# Patient Record
Sex: Male | Born: 1950 | Race: White | Hispanic: No | Marital: Married | Smoking: Never smoker
Health system: Southern US, Community
[De-identification: ages and names within clinical notes are randomized; demographics above are authoritative.]

## PROBLEM LIST (undated history)

## (undated) DIAGNOSIS — E43 Unspecified severe protein-calorie malnutrition: Secondary | ICD-10-CM

## (undated) DIAGNOSIS — G4700 Insomnia, unspecified: Secondary | ICD-10-CM

## (undated) DIAGNOSIS — K219 Gastro-esophageal reflux disease without esophagitis: Secondary | ICD-10-CM

## (undated) DIAGNOSIS — F329 Major depressive disorder, single episode, unspecified: Secondary | ICD-10-CM

## (undated) DIAGNOSIS — M5090 Cervical disc disorder, unspecified, unspecified cervical region: Secondary | ICD-10-CM

## (undated) DIAGNOSIS — Z87442 Personal history of urinary calculi: Secondary | ICD-10-CM

## (undated) DIAGNOSIS — N179 Acute kidney failure, unspecified: Secondary | ICD-10-CM

## (undated) DIAGNOSIS — K744 Secondary biliary cirrhosis: Secondary | ICD-10-CM

## (undated) DIAGNOSIS — N183 Chronic kidney disease, stage 3 unspecified: Secondary | ICD-10-CM

## (undated) DIAGNOSIS — K7581 Nonalcoholic steatohepatitis (NASH): Secondary | ICD-10-CM

## (undated) DIAGNOSIS — Z973 Presence of spectacles and contact lenses: Secondary | ICD-10-CM

## (undated) DIAGNOSIS — G629 Polyneuropathy, unspecified: Secondary | ICD-10-CM

## (undated) DIAGNOSIS — F32A Depression, unspecified: Secondary | ICD-10-CM

## (undated) DIAGNOSIS — K7682 Hepatic encephalopathy: Secondary | ICD-10-CM

## (undated) DIAGNOSIS — E119 Type 2 diabetes mellitus without complications: Secondary | ICD-10-CM

## (undated) DIAGNOSIS — I8501 Esophageal varices with bleeding: Secondary | ICD-10-CM

## (undated) DIAGNOSIS — E559 Vitamin D deficiency, unspecified: Secondary | ICD-10-CM

## (undated) DIAGNOSIS — E1140 Type 2 diabetes mellitus with diabetic neuropathy, unspecified: Secondary | ICD-10-CM

## (undated) DIAGNOSIS — R1310 Dysphagia, unspecified: Secondary | ICD-10-CM

## (undated) DIAGNOSIS — R188 Other ascites: Secondary | ICD-10-CM

## (undated) DIAGNOSIS — H269 Unspecified cataract: Secondary | ICD-10-CM

## (undated) DIAGNOSIS — M109 Gout, unspecified: Secondary | ICD-10-CM

## (undated) DIAGNOSIS — I440 Atrioventricular block, first degree: Secondary | ICD-10-CM

## (undated) DIAGNOSIS — G5603 Carpal tunnel syndrome, bilateral upper limbs: Principal | ICD-10-CM

## (undated) DIAGNOSIS — Z9289 Personal history of other medical treatment: Secondary | ICD-10-CM

## (undated) DIAGNOSIS — G4733 Obstructive sleep apnea (adult) (pediatric): Secondary | ICD-10-CM

## (undated) DIAGNOSIS — D696 Thrombocytopenia, unspecified: Secondary | ICD-10-CM

## (undated) DIAGNOSIS — D689 Coagulation defect, unspecified: Secondary | ICD-10-CM

## (undated) DIAGNOSIS — G061 Intraspinal abscess and granuloma: Secondary | ICD-10-CM

## (undated) DIAGNOSIS — K222 Esophageal obstruction: Secondary | ICD-10-CM

## (undated) DIAGNOSIS — I639 Cerebral infarction, unspecified: Secondary | ICD-10-CM

## (undated) DIAGNOSIS — K7200 Acute and subacute hepatic failure without coma: Secondary | ICD-10-CM

## (undated) DIAGNOSIS — D638 Anemia in other chronic diseases classified elsewhere: Secondary | ICD-10-CM

## (undated) HISTORY — PX: KIDNEY STONE SURGERY: SHX686

## (undated) HISTORY — DX: Carpal tunnel syndrome, bilateral upper limbs: G56.03

## (undated) HISTORY — PX: UPPER GASTROINTESTINAL ENDOSCOPY: SHX188

## (undated) HISTORY — PX: COLONOSCOPY: SHX174

## (undated) HISTORY — DX: Intraspinal abscess and granuloma: G06.1

## (undated) HISTORY — DX: Other ascites: R18.8

## (undated) HISTORY — PX: KIDNEY TRANSPLANT: SHX239

---

## 2003-03-27 ENCOUNTER — Encounter (INDEPENDENT_AMBULATORY_CARE_PROVIDER_SITE_OTHER): Payer: Self-pay | Admitting: Specialist

## 2003-03-27 ENCOUNTER — Encounter: Payer: Self-pay | Admitting: Internal Medicine

## 2003-03-27 ENCOUNTER — Ambulatory Visit (HOSPITAL_COMMUNITY): Admission: RE | Admit: 2003-03-27 | Discharge: 2003-03-27 | Payer: Self-pay | Admitting: Gastroenterology

## 2003-05-10 ENCOUNTER — Other Ambulatory Visit: Admission: RE | Admit: 2003-05-10 | Discharge: 2003-05-10 | Payer: Self-pay | Admitting: Oncology

## 2004-08-12 ENCOUNTER — Ambulatory Visit: Payer: Self-pay | Admitting: Internal Medicine

## 2004-08-14 ENCOUNTER — Ambulatory Visit: Payer: Self-pay | Admitting: Internal Medicine

## 2004-08-15 ENCOUNTER — Ambulatory Visit: Payer: Self-pay | Admitting: Internal Medicine

## 2004-08-15 ENCOUNTER — Ambulatory Visit (HOSPITAL_COMMUNITY): Admission: RE | Admit: 2004-08-15 | Discharge: 2004-08-15 | Payer: Self-pay | Admitting: Internal Medicine

## 2004-08-16 ENCOUNTER — Encounter: Admission: RE | Admit: 2004-08-16 | Discharge: 2004-08-16 | Payer: Self-pay | Admitting: Internal Medicine

## 2004-08-16 ENCOUNTER — Ambulatory Visit: Payer: Self-pay | Admitting: Internal Medicine

## 2004-08-26 ENCOUNTER — Ambulatory Visit: Payer: Self-pay | Admitting: Internal Medicine

## 2004-08-26 ENCOUNTER — Encounter: Payer: Self-pay | Admitting: Internal Medicine

## 2004-09-23 ENCOUNTER — Ambulatory Visit: Payer: Self-pay | Admitting: Internal Medicine

## 2004-09-23 ENCOUNTER — Ambulatory Visit (HOSPITAL_COMMUNITY): Admission: RE | Admit: 2004-09-23 | Discharge: 2004-09-23 | Payer: Self-pay | Admitting: Internal Medicine

## 2004-09-23 DIAGNOSIS — K222 Esophageal obstruction: Secondary | ICD-10-CM | POA: Insufficient documentation

## 2004-11-04 ENCOUNTER — Ambulatory Visit: Payer: Self-pay | Admitting: Internal Medicine

## 2006-11-12 ENCOUNTER — Ambulatory Visit: Payer: Self-pay | Admitting: Internal Medicine

## 2006-11-19 ENCOUNTER — Encounter: Payer: Self-pay | Admitting: Internal Medicine

## 2006-11-19 ENCOUNTER — Ambulatory Visit: Payer: Self-pay | Admitting: Internal Medicine

## 2006-12-03 ENCOUNTER — Ambulatory Visit: Payer: Self-pay | Admitting: Internal Medicine

## 2007-01-19 ENCOUNTER — Encounter (HOSPITAL_COMMUNITY): Admission: RE | Admit: 2007-01-19 | Discharge: 2007-04-12 | Payer: Self-pay | Admitting: Emergency Medicine

## 2007-02-02 ENCOUNTER — Ambulatory Visit: Payer: Self-pay | Admitting: Oncology

## 2007-03-16 ENCOUNTER — Ambulatory Visit: Payer: Self-pay | Admitting: Internal Medicine

## 2007-06-06 ENCOUNTER — Inpatient Hospital Stay (HOSPITAL_COMMUNITY): Admission: EM | Admit: 2007-06-06 | Discharge: 2007-06-08 | Payer: Self-pay | Admitting: Emergency Medicine

## 2007-06-08 ENCOUNTER — Ambulatory Visit: Payer: Self-pay | Admitting: Internal Medicine

## 2007-07-14 ENCOUNTER — Ambulatory Visit: Payer: Self-pay | Admitting: Internal Medicine

## 2007-11-30 ENCOUNTER — Encounter: Payer: Self-pay | Admitting: Internal Medicine

## 2008-05-12 ENCOUNTER — Inpatient Hospital Stay (HOSPITAL_COMMUNITY): Admission: EM | Admit: 2008-05-12 | Discharge: 2008-05-19 | Payer: Self-pay | Admitting: Emergency Medicine

## 2008-05-15 ENCOUNTER — Ambulatory Visit: Payer: Self-pay | Admitting: Gastroenterology

## 2008-05-17 ENCOUNTER — Encounter (INDEPENDENT_AMBULATORY_CARE_PROVIDER_SITE_OTHER): Payer: Self-pay | Admitting: Interventional Radiology

## 2008-06-13 ENCOUNTER — Ambulatory Visit: Payer: Self-pay | Admitting: Internal Medicine

## 2008-06-14 LAB — CONVERTED CEMR LAB
AST: 28 units/L (ref 0–37)
Albumin: 3.4 g/dL — ABNORMAL LOW (ref 3.5–5.2)
Alkaline Phosphatase: 54 units/L (ref 39–117)
BUN: 10 mg/dL (ref 6–23)
Chloride: 103 meq/L (ref 96–112)
Eosinophils Absolute: 0.1 10*3/uL (ref 0.0–0.7)
Eosinophils Relative: 3.7 % (ref 0.0–5.0)
GFR calc non Af Amer: 107 mL/min
Glucose, Bld: 298 mg/dL — ABNORMAL HIGH (ref 70–99)
INR: 1.4 — ABNORMAL HIGH (ref 0.8–1.0)
Monocytes Relative: 15.1 % — ABNORMAL HIGH (ref 3.0–12.0)
Neutrophils Relative %: 48.8 % (ref 43.0–77.0)
Platelets: 60 10*3/uL — ABNORMAL LOW (ref 150–400)
Potassium: 4.3 meq/L (ref 3.5–5.1)
WBC: 3.5 10*3/uL — ABNORMAL LOW (ref 4.5–10.5)

## 2008-07-10 ENCOUNTER — Encounter: Payer: Self-pay | Admitting: Internal Medicine

## 2008-07-11 ENCOUNTER — Ambulatory Visit (HOSPITAL_COMMUNITY): Admission: RE | Admit: 2008-07-11 | Discharge: 2008-07-11 | Payer: Self-pay | Admitting: Internal Medicine

## 2008-07-11 ENCOUNTER — Ambulatory Visit: Payer: Self-pay | Admitting: Internal Medicine

## 2008-08-03 ENCOUNTER — Encounter: Payer: Self-pay | Admitting: Internal Medicine

## 2008-12-20 ENCOUNTER — Encounter (INDEPENDENT_AMBULATORY_CARE_PROVIDER_SITE_OTHER): Payer: Self-pay | Admitting: *Deleted

## 2009-01-01 ENCOUNTER — Encounter: Payer: Self-pay | Admitting: Internal Medicine

## 2009-01-30 ENCOUNTER — Encounter (INDEPENDENT_AMBULATORY_CARE_PROVIDER_SITE_OTHER): Payer: Self-pay | Admitting: *Deleted

## 2009-06-01 ENCOUNTER — Encounter: Payer: Self-pay | Admitting: Internal Medicine

## 2009-06-25 ENCOUNTER — Encounter: Payer: Self-pay | Admitting: Internal Medicine

## 2009-11-26 ENCOUNTER — Encounter: Payer: Self-pay | Admitting: Internal Medicine

## 2009-12-28 ENCOUNTER — Encounter: Payer: Self-pay | Admitting: Internal Medicine

## 2010-03-28 ENCOUNTER — Ambulatory Visit: Payer: Self-pay | Admitting: Internal Medicine

## 2010-03-28 LAB — CONVERTED CEMR LAB
ALT: 23 units/L (ref 0–53)
AST: 30 units/L (ref 0–37)
Alkaline Phosphatase: 48 units/L (ref 39–117)
Basophils Absolute: 0 10*3/uL (ref 0.0–0.1)
Eosinophils Absolute: 0.1 10*3/uL (ref 0.0–0.7)
HCT: 37.2 % — ABNORMAL LOW (ref 39.0–52.0)
Lymphs Abs: 0.8 10*3/uL (ref 0.7–4.0)
MCHC: 35.2 g/dL (ref 30.0–36.0)
Monocytes Absolute: 0.4 10*3/uL (ref 0.1–1.0)
Monocytes Relative: 13 % — ABNORMAL HIGH (ref 3.0–12.0)
Platelets: 46 10*3/uL — CL (ref 150.0–400.0)
RDW: 15.5 % — ABNORMAL HIGH (ref 11.5–14.6)
Sodium: 135 meq/L (ref 135–145)
Total Bilirubin: 1.4 mg/dL — ABNORMAL HIGH (ref 0.3–1.2)
Total Protein: 6.8 g/dL (ref 6.0–8.3)

## 2010-04-01 ENCOUNTER — Encounter: Payer: Self-pay | Admitting: Internal Medicine

## 2010-04-26 ENCOUNTER — Ambulatory Visit: Payer: Self-pay | Admitting: Internal Medicine

## 2010-04-26 ENCOUNTER — Ambulatory Visit (HOSPITAL_COMMUNITY)
Admission: RE | Admit: 2010-04-26 | Discharge: 2010-04-26 | Payer: Self-pay | Source: Home / Self Care | Admitting: Internal Medicine

## 2010-06-03 ENCOUNTER — Encounter: Payer: Self-pay | Admitting: Internal Medicine

## 2010-06-04 ENCOUNTER — Encounter: Payer: Self-pay | Admitting: Internal Medicine

## 2010-08-06 NOTE — Letter (Signed)
Summary: Diabetic Instructions  [facility]  [address] [place], [place] [address]   Phone: [phone]  Fax: [phone]    [name] [birth year] MRN: [id number]   _ x_   ORAL DIABETIC MEDICATION INSTRUCTIONS             ACTOS, METFORMIN The day before your procedure:   Take your diabetic pill as you do normally  The day of your procedure:   Do not take your diabetic pill    We will check your blood sugar levels during the admission process and again in Recovery before discharging you home  ________________________________________________________________________

## 2010-08-06 NOTE — Procedures (Signed)
Summary: Instructions for procedure/[place]  Instructions for procedure/[place]   Imported By: Sherian Rein 04/01/2010 10:11:13  _____________________________________________________________________  External Attachment:    Type:   Image     Comment:   External Document

## 2010-08-06 NOTE — Miscellaneous (Signed)
Summary: Propranolol Rx  Clinical Lists Changes  Medications: Changed medication from PROPRANOLOL HCL 20 MG TABS (PROPRANOLOL HCL) 1 tablet by mouth once daily to PROPRANOLOL HCL 20 MG TABS (PROPRANOLOL HCL) 1 tablet by mouth once daily - Signed Rx of PROPRANOLOL HCL 20 MG TABS (PROPRANOLOL HCL) 1 tablet by mouth once daily;  #30 x 2;  Signed;  Entered by: Lamona Curl CMA (AAMA);  Authorized by: Hilarie Fredrickson MD;  Method used: Electronically to [facility]*, [address], [place], [place], [place]  [address], Ph: [phone], Fax: [phone]    Prescriptions: PROPRANOLOL HCL 20 MG TABS (PROPRANOLOL HCL) 1 tablet by mouth once daily  #30 x 2   Entered by:   Lamona Curl CMA (AAMA)   Authorized by:   Hilarie Fredrickson MD   Signed by:   Lamona Curl CMA (AAMA) on 04/01/2010   Method used:   Electronically to        [facility]* (retail)       [address] [place], [place]  [address]       Ph: [phone]       Fax: [phone]   RxID:   [id number]

## 2010-08-06 NOTE — Medication Information (Signed)
Summary: Propranolol/[employer]  Propranolol/[employer]   Imported By: Sherian Rein 06/07/2010 14:07:55  _____________________________________________________________________  External Attachment:    Type:   Image     Comment:   External Document

## 2010-08-06 NOTE — Assessment & Plan Note (Signed)
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 His liver disease was initially diagnosed in February 2006. Complications of his liver disease have included bleeding esophageal varices and mild transient ascites. The patient has been lost to followup despite being sent minor notices. He has not been seen in close to 2 years. As well, he has not follow up with [facility] as they requested. He schedules this appointment and now because of some lower abdominal discomfort that began 3 weeks ago. He is concerned that this is a precursor to variceal bleeding. He describes the discomfort as cramping and associated with somewhat constipated bowels. Problem is relieved with defecation. He is also noted that Pepto-Bismol tablets help. The discomfort has been somewhat better recently. He is on several oral agents for his diabetes which he thinks over his bowel habits. His last colonoscopy was in 2008 with followup anticipated in 2013. He denies melena or hematochezia. No weight loss. He continues to work at [employer]. He denies problems with edema, ascites, or encephalopathy. His last upper endoscopy was performed in January 2010. At that time short segment grade 2 varices were band ligated. Routine followup should have been 6 months thereafter. His primary care physician is in [place].Marland Kitchen His endocrinologist in [place].   GI Review of Systems    Reports abdominal pain.     Location of  Abdominal pain: lower abdomen.    Denies acid reflux, belching, bloating, chest  pain, dysphagia with liquids, dysphagia with solids, heartburn, loss of appetite, nausea, vomiting, vomiting blood, weight loss, and  weight gain.      Reports constipation, diarrhea, and  liver problems.     Denies anal fissure, black tarry stools, change in bowel habit, diverticulosis, fecal incontinence, heme positive stool, hemorrhoids, irritable bowel syndrome, jaundice, light color stool, rectal bleeding, and  rectal pain.    Current Medications (verified): 1)  Propranolol Hcl 20 Mg Tabs (Propranolol Hcl) .Marland Kitchen.. 1 Tablet By Mouth Once Daily 2)  Protonix 40 Mg Tbec (Pantoprazole Sodium) .Marland Kitchen.. 1 Tablet By Mouth Once Daily 3)  Metformin Hcl 1000 Mg Tabs (Metformin Hcl) .Marland Kitchen.. 1 Tablet By Mouth Two Times A Day 4)  Victosa 18mg  .... Inject Once Daily 5)  Actos 30 Mg Tabs (Pioglitazone Hcl) .... Once Daily  Allergies (verified): No Known Drug Allergies  Past History:  Past Medical History: Reviewed history from 09/27/2007 and no changes required. 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 Unremarkable  Family History: Reviewed history from 06/13/2008 and no changes required. No FH of Colon Cancer: Family History of Diabetes: Father Family History of Heart Disease: Father  Social History: Reviewed history from 06/13/2008 and no changes required. Patient has never smoked.  Alcohol Use - no Illicit Drug Use - no Patient  does not get regular exercise.  Review of Systems  The patient denies allergy/sinus, anemia, anxiety-new, arthritis/joint pain, back pain, blood in urine, breast changes/lumps, change in vision, confusion, cough, coughing up blood, depression-new, fainting, fatigue, fever, headaches-new, hearing problems, heart murmur, heart rhythm changes, itching, menstrual pain, muscle pains/cramps, night sweats, nosebleeds, pregnancy symptoms, shortness of breath, skin rash, sleeping problems, sore throat, swelling of feet/legs, swollen lymph glands, thirst - excessive , urination - excessive , urination changes/pain, urine leakage, vision changes, and voice change.    Vital Signs:  Patient profile:   60 year old male Height:      71 inches Weight:      209.38 pounds BMI:     29.31 Pulse rate:   72 / minute Pulse rhythm:   regular BP sitting:   104 / 60  (left arm) Cuff size:   regular  Vitals Entered By: June McMurray CMA Duncan Dull) (March 28, 2010 10:54 AM)  Physical Exam  General:  Well developed,obese, well nourished, no acute distress. Head:  Normocephalic and atraumatic. Eyes:  PERRLA, no icterus. Nose:  No deformity, discharge,  or lesions. Mouth:  No deformity or lesions, dentition normal. Neck:  Supple; no masses or thyromegaly. Lungs:  Clear throughout to auscultation. Heart:  Regular rate and rhythm; no murmurs, rubs,  or bruits. Abdomen:  Soft, nontender and nondistended. No masses, hepatosplenomegaly or hernias noted. Normal bowel sounds. Msk:  Symmetrical with no gross deformities. Normal posture. Pulses:  Normal pulses noted. Extremities:  No clubbing, cyanosis, edema or deformities noted. Neurologic:  Alert and  oriented x4;  grossly normal neurologically.no asterixis Skin:  hypervascularity of the malar region Psych:  Alert and cooperative. Normal mood and affect.   Impression & Recommendations:  Problem # 1:  ABDOMINAL PAIN, GENERALIZED (ICD-40.32) 60 year old with  multiple medical problems including cryptogenic cirrhosis who presents with intermittent but improved lower abdominal pain of several weeks duration. The features were most consistent with discomfort due to altered bowel habits. No worrisome features. Colonoscopy up-to-date.  Plan: #1. Reassurance that this is not a precursor to variceal bleeding #2. Okay to use Pepto-Bismol p.r.n. if this helps #3. Stool softeners or other agents p.r.n. to keep bowels regular .  Problem # 2:  CONSTIPATION (ICD-564.00) stool softeners or other agents as discussed p.r.n.  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 Well compensated. History of variceal bleed. Lost to followup in this office as well as [facility].  Plan: #1. Blood work including CBC, comprehensive metabolic panel, PT/INR, TSH #2. Due for followup surveillance endoscopy to evaluate varices and band ligate if indicated. The nature of the procedure as well as the risks, benefits, and alternatives were reviewed. He understood and agreed to proceed. This has been tentatively set up for Friday, October 21 at 8 AM at [facility] long hospital.. Hold diabetic medications the morning of the procedure until p.o. intake resumed. #3. Sensible exercise and gradual weight loss #4. Reestablish with [facility] service. Advised. 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 History of endoscopy with band ligation. Last endoscopy with ligation January 2010. Overdue for followup. Plans for endoscopy with possible band ligation of varices as outlined above  Problem # 5:  GERD (ICD-530.81) GERD complicated by esophagitis and stricture. Currently asymptomatic on pantoprazole.  Plan: #1. Continue pantoprazole #2. Reflux precautions with attention to weight loss  Problem # 6:  PERSONAL HX COLONIC POLYPS (ICD-V12.72) history of adenomatous colon polyps. Surveillance up to date. Due for routine  follow up around May 2013.  Problem # 7:  DM (ICD-250.00) adjust diabetic medications for the procedure.  Other Orders: ZEGD with Banding (ZEGD Band) TLB-CBC Platelet - w/Differential (85025-CBCD) TLB-CMP (Comprehensive Metabolic Pnl) (80053-COMP) TLB-PT (Protime) (85610-PTP)  Patient Instructions: 1)  Please go to the lab on the basement floor before leaving today in order to have your CBC, CMET and Patient/INR drawn. 2)  You have been scheduled for an endoscopy with banding at [facility] on 04/26/10 @ 8 am. You should arrive at Outpatient Registration  @ 7 am for regsitration. 3)  Copy sent to : Dr Drinda Butts, Dr. Konrad Felix ([place]), Dr. Roselle Locus, [place] ([facility]) 4)  The medication list was reviewed and reconciled.  All changed / newly prescribed medications were explained.  A complete medication list was provided to the patient / caregiver.

## 2010-08-06 NOTE — Progress Notes (Signed)
Summary: [facility]   Imported By: Sherian Rein 04/19/2010 09:04:05  _____________________________________________________________________  External Attachment:    Type:   Image     Comment:   External Document

## 2010-08-06 NOTE — Procedures (Signed)
Summary: EGD   EGD  Procedure date:  08/15/2004  Findings:      Location: [facility]  Esophageal Varices Portal Hypertension  EGD  Procedure date:  08/15/2004  Findings:      Location: [facility]  Esophageal Varices Portal Hypertension Patient Name: Izrael, Peak MRN: [id number] Procedure Procedures: EsophagoscopyCPT: 43200.    with variceal sclerosis or banding. CPT: C4495593.  Personnel: Endoscopist: Iva Boop, MD, [facility].  Exam Location: Exam performed in Endoscopy Suite. Outpatient  Patient Consent: Procedure, Alternatives, Risks and Benefits discussed, consent obtained,  Indications  Therapeutics: Reason for exam: Treatment of varices.  History  Current Medications: Patient is not currently taking Coumadin.  Comments: Esophageal varices seen at EGD yesterday with recent upper GI bleed. Pre-Exam Physical: Performed Aug 14, 2004  Entire physical exam was normal. Cardio- pulmonary exam, HEENT exam, Mental status exam WNL.  Evidence: Based on portal hypertension. Based on abnormal labs including: Platelets  Exam Exam Info: Maximum depth of insertion Stomach, intended Stomach. Patient position: on left side. Gastric retroflexion performed. Images taken. ASA Classification: III. Tolerance: excellent.  Sedation Meds: Patient assessed and found to be appropriate for moderate (conscious) sedation. Benzocaine/Tetracaine given aerosolized. Versed 8 mg. given IV. Fentanyl 50 mcg. given IV.  Monitoring: BP and pulse monitoring done. Oximetry used. Supplemental O2 given  Fluoroscopy: Fluoroscopy was not used.  Findings Normal: Proximal Esophagus.  - VARICES: Proximal level Mid Esophagus. 3 varices found. Proximal edge of varices 30 cm from incisors, distal margin 39 cm. maximum size: Grade II. Varices show no stigmata of recent bleed. ICD9: Esophageal Varices, without bleed: 456.1. - Banding: Mid Esophagus. A total of 5 bands were fired, 4  bands were placed, 1 bands misfired. Banding device used: Wilson-Cook. Outcome: successful.  MUCOSAL ABNORMALITY: Fundus to Body. Red spots present. Edema present. ICD9: Portal Hypertension: 572.3. Comment: Comsistent with portal gastropathy.   Assessment Abnormal examination, see findings above.  Diagnoses: 456.1: Esophageal Varices, without bleed.  572.3: Portal Hypertension.   Comments: Successful ligation of esophageal varices. Events  Unplanned Intervention: No unplanned interventions were required.  Plans Medication(s): Continue current medications.  Disposition: After procedure patient sent to recovery.  Comments: Keep follow-up with Dr. Yancey Flemings tomorrow. He will arrange further evaluation and treatment.  This report was created from the original endoscopy report, which was reviewed and signed by the above listed endoscopist.   cc:  Yancey Flemings, MD      Drue Second, MD      Ellamae Sia, MD      The Patient

## 2010-08-06 NOTE — Procedures (Signed)
Summary: Colonoscopy   Colonoscopy  Procedure date:  08/14/2004  Findings:      Location:  [facility].  Results: Hemorrhoids.     Results: Diverticulosis.       Pathology:  Hyperplastic polyp.       Procedures Next Due Date:    Colonoscopy: 08/2007  Colonoscopy  Procedure date:  08/14/2004  Findings:      Location:  [facility].  Results: Hemorrhoids.     Results: Diverticulosis.       Pathology:  Hyperplastic polyp.       Procedures Next Due Date:    Colonoscopy: 08/2007 Patient Name: [name], [name] MRN: [id number] Procedure Procedures: Colonoscopy CPT: 78295.    with polypectomy. CPT: A3573898.  Personnel: Endoscopist: Wilhemina Bonito. Marina Goodell, MD.  Referred By: Ellamae Sia, MD.  Exam Location: Exam performed in Outpatient Clinic. Outpatient  Patient Consent: Procedure, Alternatives, Risks and Benefits discussed, consent obtained, from patient. Consent was obtained by the RN.  Indications  Surveillance of: Adenomatous Polyp(s). This is an initial surveillance exam. Initial polypectomy was performed in 2004. in Sep. 1-2 Polyps were found at Index Exam. Largest polyp removed was > 19 mm. Prior polyp located in distal colon. Pathology of worst  polyp: high-grade dysplasia.  History  Current Medications: Patient is not currently taking Coumadin.  Pre-Exam Physical: Performed Aug 14, 2004. Entire physical exam was normal.  Exam Exam: Extent of exam reached: Cecum, extent intended: Cecum.  The cecum was identified by appendiceal orifice and IC valve. Patient position: on left side. Colon retroflexion performed. Images taken. ASA Classification: II. Tolerance: excellent.  Monitoring: Pulse and BP monitoring, Oximetry used. Supplemental O2 given.  Colon Prep Used Miralax for colon prep. Prep results: excellent.  Sedation Meds: Patient assessed and found to be appropriate for moderate (conscious) sedation. Fentanyl 75 mcg. given IV.  Versed 6 mg. given IV.  Findings NORMAL EXAM: Cecum to Rectum.  DIVERTICULOSIS: Sigmoid Colon. ICD9: Diverticulosis, Colon: 562.10. Comments: Very mild changes.  MULTIPLE POLYPS: Sigmoid Colon. minimum size 2 mm, maximum size 3 mm. Procedure:  snare without cautery, removed, Polyp retrieved, 2 polyps ICD9: Colon Polyps: 211.3.   Assessment Abnormal examination, see findings above.  Diagnoses: 211.3: Colon Polyps.  562.10: Diverticulosis, Colon.  455.0: Hemorrhoids, Internal.   Events  Unplanned Interventions: No intervention was required.  Unplanned Events: There were no complications. Plans Disposition: After procedure patient sent to recovery. After recovery patient sent home.  Scheduling/Referral: Colonoscopy, to Wilhemina Bonito. Marina Goodell, MD, in 3 years,    This report was created from the original endoscopy report, which was reviewed and signed by the above listed endoscopist.   cc:  Ellamae Sia, MD      The Patient

## 2010-08-06 NOTE — Letter (Signed)
Summary: EGD Instructions  [facility]  [address] [place], [place] [address]   Phone: [phone]  Fax: [phone]       AARON BOSTWICK    05-31-1951    MRN: [id number]       Procedure Day /Date: 04/26/10 Friday      Arrival Time: 7:00 am     Procedure Time: 8:00 am     Location of Procedure:                    _x  _ [facility] ( Outpatient Registration)  PREPARATION FOR ENDOSCOPY   On 04/26/10 THE DAY OF THE PROCEDURE:  1.   No solid foods, milk or milk products are allowed after midnight the night before your procedure.  2.   Do not drink anything colored red or purple.  Avoid juices with pulp.  No orange juice.  3.  You may drink clear liquids until 6:00 am, which is 2 hours before your procedure.                                                                                                CLEAR LIQUIDS INCLUDE: Water Jello Ice Popsicles Tea (sugar ok, no milk/cream) Powdered fruit flavored drinks Coffee (sugar ok, no milk/cream) Gatorade Juice: apple, white grape, white cranberry  Lemonade Clear bullion, consomm, broth Carbonated beverages (any kind) Strained chicken noodle soup Hard Candy   MEDICATION INSTRUCTIONS  Unless otherwise instructed, you should take regular prescription medications with a small sip of water as early as possible the morning of your procedure.  Diabetic patients - see separate instructions.                 OTHER INSTRUCTIONS  You will need a responsible adult at least 58 years of age to accompany you and drive you home.   This person must remain in the waiting room during your procedure.  Wear loose fitting clothing that is easily removed.  Leave jewelry and other valuables at home.  However, you may wish to bring a book to read or an iPod/MP3 player to listen to music as you wait for your procedure to start.  Remove all body piercing jewelry and leave at home.  Total time from sign-in  until discharge is approximately 2-3 hours.  You should go home directly after your procedure and rest.  You can resume normal activities the day after your procedure.  The day of your procedure you should not:   Drive   Make legal decisions   Operate machinery   Drink alcohol   Return to work  You will receive specific instructions about eating, activities and medications before you leave.    The above instructions have been reviewed and explained to me by   Lamona Curl CMA Duncan Dull)  March 28, 2010 11:39 AM     I fully understand and can verbalize these instructions _____________________________ Date 03/28/10

## 2010-08-06 NOTE — Miscellaneous (Signed)
Summary: Refill of Pantoprazole  Clinical Lists Changes  Medications: Rx of PROTONIX 40 MG TBEC (PANTOPRAZOLE SODIUM) 1 tablet by mouth once daily;  #30 x 11;  Signed;  Entered by: Milford Cage NCMA;  Authorized by: Hilarie Fredrickson MD;  Method used: Electronically to [facility]*, [address], [place], [place], [place]  [address], Ph: [phone], Fax: [phone]    Prescriptions: PROTONIX 40 MG TBEC (PANTOPRAZOLE SODIUM) 1 tablet by mouth once daily  #30 x 11   Entered by:   Milford Cage NCMA   Authorized by:   Hilarie Fredrickson MD   Signed by:   Milford Cage NCMA on 06/03/2010   Method used:   Electronically to        [facility]* (retail)       [address] [place], [place]  [address]       Ph: [phone]       Fax: [phone]   RxID:   [id number]

## 2010-08-06 NOTE — Medication Information (Signed)
Summary: [name]   Imported By: Lester Elwood 01/02/2010 11:00:50  _____________________________________________________________________  External Attachment:    Type:   Image     Comment:   External Document

## 2010-08-06 NOTE — Medication Information (Signed)
Summary: [name]   Imported By: Lester Sanger 11/30/2009 08:08:34  _____________________________________________________________________  External Attachment:    Type:   Image     Comment:   External Document

## 2010-08-06 NOTE — Procedures (Signed)
Summary: Upper Endoscopy  Patient: Yanuel Tagg Note: All result statuses are Final unless otherwise noted.  Tests: (1) Upper Endoscopy (EGD)   EGD Upper Endoscopy       DONE     [facility]     [address] [place], [place]  [address]           ENDOSCOPY PROCEDURE REPORT           PATIENT:  Johnattan, Strassman  MR#:  [id number]     BIRTHDATE:  02/19/1951, 59 yrs. old  GENDER:  male           ENDOSCOPIST:  Wilhemina Bonito. Eda Keys, MD     Referred by:  Office           PROCEDURE DATE:  04/26/2010     PROCEDURE:  EGD with band ligation of varices x 3     ASA CLASS:  Class III     INDICATIONS:  Known esophageal varices, for surveillance exam.           MEDICATIONS:   Fentanyl 100 mcg, Versed 8 mg IV     TOPICAL ANESTHETIC:  Exactacain Spray           DESCRIPTION OF PROCEDURE:   After the risks benefits and     alternatives of the procedure were thoroughly explained, informed     consent was obtained.  The Pentax [id number] Therapeutic [id number]     endoscope was introduced through the mouth and advanced to the     second portion of the duodenum, without limitations.  The     instrument was slowly withdrawn as the mucosa was fully examined.     <<PROCEDUREIMAGES>>           Nuckles of Grade II varices were found in the distal esophagus in     3 columns. Also, scarring from prior banding evident.  Mild Portal     Hypertensive gastropathy throughout.  The duodenal bulb was normal     in appearance, as was the postbulbar duodenum.    Retroflexed     views revealed no additional abnormalities.    The scope was then     withdrawn from the patient and the procedure completed.           COMPLICATIONS:  None           ENDOSCOPIC IMPRESSION:     1) Grade II varices in the distal esophagus as described - s/p     band ligation x 3     2) Portal Hypertensive gastropathy     3) Normal duodenum           RECOMMENDATIONS:     1) Clear liquids until 4pm, then soft foods rest of day   thereafter. Resume prior diet tomorrow.     2) Repeat upper endoscopy with possible banding in 6 months           ______________________________     Wilhemina Bonito. Eda Keys, MD           CC:  Reather Littler, MD, The Patient           n.     eSIGNED:   Wilhemina Bonito. Eda Keys at 04/26/2010 08:54 AM           Albesa Seen, [id number]  Note: An exclamation mark (!) indicates a result that was not dispersed into the flowsheet. Document Creation Date: 04/26/2010 8:56 AM _______________________________________________________________________  Marland Kitchen  1) Order result status: Final Collection or observation date-time: 04/26/2010 08:38 Requested date-time:  Receipt date-time:  Reported date-time:  Referring Physician:   Ordering Physician: Fransico Setters [phone]) Specimen Source:  Source: Launa Grill Order Number: [id number] Lab site:

## 2010-08-06 NOTE — Procedures (Signed)
Summary: Colonoscopy/[place]  Colonoscopy/[place]   Imported By: Sherian Rein 04/19/2010 09:01:12  _____________________________________________________________________  External Attachment:    Type:   Image     Comment:   External Document

## 2010-08-06 NOTE — Procedures (Signed)
Summary: EGD   EGD  Procedure date:  08/14/2004  Findings:      Location: [facility]  Esophageal Varices Portal Gastropathy  EGD  Procedure date:  08/14/2004  Findings:      Location: [facility]  Esophageal Varices Portal Gastropathy Patient Name: Benjamen, Koelling MRN: [id number] Procedure Procedures: Panendoscopy (EGD) CPT: 43235.  Personnel: Endoscopist: Wilhemina Bonito. Marina Goodell, MD.  Referred By: Ellamae Sia, MD.  Exam Location: Exam performed in Outpatient Clinic. Outpatient  Patient Consent: Procedure, Alternatives, Risks and Benefits discussed, consent obtained, from patient. Consent was obtained by the RN.  Indications Symptoms: Hematemesis. Last bleeding episode >48 hours ago, documentation: patient report. Melena. Last bleeding episode >48 hours ago, documentation: patient report.  History  Current Medications: Patient is not currently taking Coumadin.  Pre-Exam Physical: Performed Aug 14, 2004  Entire physical exam was normal.  Exam Exam Info: Maximum depth of insertion Duodenum, intended Duodenum. Patient position: on left side. Vocal cords visualized. Gastric retroflexion performed. Images taken. ASA Classification: II. Tolerance: excellent.  Sedation Meds: Patient assessed and found to be appropriate for moderate (conscious) sedation. Residual sedation present from prior procedure today.  Monitoring: BP and pulse monitoring done. Oximetry used. Supplemental O2 given  Findings - VARICES: Proximal level Distal Esophagus. 3 varices found. Proximal edge of varices 30 cm from incisors, distal margin 39 cm. maximum size: Grade II. Varices show no stigmata of recent bleed. ICD9: Esophageal Varices, without bleed: 456.1. HIATAL HERNIA: Comments: NO GASTRIC VARICES.  - OTHER FINDING: CONGESTED MUCOSA C/W PORTAL GASTROPATHY in Body.  - Normal: Duodenal Bulb to Duodenal Apex.   Assessment Abnormal examination, see findings above.    Diagnoses: 456.1: Esophageal Varices, without bleed. CAUSE FOR RECENT GI BLEED.   Comments: PORTAL GASTROPATHY Events  Unplanned Intervention: No unplanned interventions were required.  Unplanned Events: There were no complications. Plans Disposition: After procedure patient sent to recovery. After recovery patient sent home.  Comments: 1.LABS TO WORK UP LIVER DISEASE 2.STAY OUT OF WORK FOR NOW 3.GET RECORDS FRON DR. KHAN 4.EGD WITH BANDING AT HOSPITAL TOMORROW (DR GESSNER 2:30 [facility]) 5.OFFICE VISIT WITH DR. Rileigh Kawashima FRIDAY FEB 10TH 8:00 AM  This report was created from the original endoscopy report, which was reviewed and signed by the above listed endoscopist.   cc:  Ellamae Sia, MD      Garey Ham, MD      The Patient

## 2010-08-06 NOTE — Progress Notes (Signed)
Summary: [facility]   Imported By: Sherian Rein 04/19/2010 09:03:18  _____________________________________________________________________  External Attachment:    Type:   Image     Comment:   External Document

## 2010-08-06 NOTE — Procedures (Signed)
Summary: EGD   EGD  Procedure date:  09/23/2004  Findings:      Location: [facility]  Findings: Esophagitis  Findings: Stricture:  Portal Hypertension Esophageal Varices Patient Name: [name], [name] MRN: [id number] Procedure Procedures: Panendoscopy (EGD) CPT: 43235.  Personnel: Endoscopist: Wilhemina Bonito. Marina Goodell, MD.  Exam Location: Exam performed in Endoscopy Suite.  Patient Consent: Procedure, Alternatives, Risks and Benefits discussed, consent obtained,  Indications  Surveillance of: Varices.  History  Current Medications: Patient is not currently taking Coumadin.  Pre-Exam Physical: Performed Aug 14, 2004  Cardio-pulmonary exam WNL. HEENT exam abnormal. Abdominal exam, Extremity exam, Neurological exam, Mental status exam WNL.  History of Known Varices: Esophageal, History of prior bleed. Prior eradication incomplete.  Etiology: Cryptogenic.  Evidence: Based on portal hypertension. Based on abnormal labs including:  Exam Exam Info: Maximum depth of insertion Duodenum, intended Duodenum. Patient position: on left side. Vocal cords visualized. Gastric retroflexion performed. Images taken. ASA Classification: III. Tolerance: excellent.  Sedation Meds: Demerol 50 mg. given IV. Versed 5 mg. given IV.  Monitoring: BP and pulse monitoring done. Oximetry used. Supplemental O2 given  Fluoroscopy: Fluoroscopy was not used.  Findings - VARICES: Proximal level Distal Esophagus. ICD9: Esophageal Varices, without bleed: 456.1.Comment: Mostly scarring and some stricturing from prior treatment.Remnant varices only, not requiring bandi ligation.  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 Severity is mild, erythema only.  [place] Classification: Grade A. ICD9: Esophagitis, Reflux: 530.11.  - MUCOSAL ABNORMALITY: Cardia to Antrum. Portal hypertension. ICD9: Portal Hypertension: 572.3. Comment: diffuse portal gastropathy.    Assessment Abnormal examination, see findings above.  Diagnoses: 456.1: Esophageal Varices, without bleed.  530.11: Esophagitis, Reflux.  572.3: Portal Hypertension.  530.3: Esophageal Stricture.   Events  Unplanned Intervention: No unplanned interventions were required.  Unplanned Events: There were no complications. Plans Medication(s): PPI: Pantoprazole/Protonix 40 mg QD,   Comments: Multivitamin with iron each day Disposition: After procedure patient sent to recovery. After recovery patient sent home.  Scheduling: Call office for appointment, to Wilhemina Bonito. Marina Goodell, MD, in 4-6 weeks    This report was created from the original endoscopy report, which was reviewed and signed by the above listed endoscopist.   cc:  Ellamae Sia, DO      The Patient

## 2010-08-06 NOTE — Progress Notes (Signed)
Summary: [facility]   Imported By: Sherian Rein 04/19/2010 09:04:51  _____________________________________________________________________  External Attachment:    Type:   Image     Comment:   External Document

## 2010-08-06 NOTE — Consult Note (Signed)
Summary: [facility]   Imported By: Sherian Rein 04/19/2010 09:02:17  _____________________________________________________________________  External Attachment:    Type:   Image     Comment:   External Document

## 2010-09-18 LAB — GLUCOSE, CAPILLARY: Glucose-Capillary: 139 mg/dL — ABNORMAL HIGH (ref 70–99)

## 2010-10-10 ENCOUNTER — Encounter: Payer: Self-pay | Admitting: Internal Medicine

## 2010-10-21 LAB — GLUCOSE, CAPILLARY
Glucose-Capillary: 243 mg/dL — ABNORMAL HIGH (ref 70–99)
Glucose-Capillary: 359 mg/dL — ABNORMAL HIGH (ref 70–99)

## 2010-10-29 ENCOUNTER — Encounter: Payer: Self-pay | Admitting: Internal Medicine

## 2010-11-05 ENCOUNTER — Ambulatory Visit (INDEPENDENT_AMBULATORY_CARE_PROVIDER_SITE_OTHER): Payer: Managed Care, Other (non HMO) | Admitting: Internal Medicine

## 2010-11-05 ENCOUNTER — Encounter: Payer: Self-pay | Admitting: Internal Medicine

## 2010-11-05 VITALS — BP 100/62 | HR 64 | Ht 71.0 in | Wt 219.8 lb

## 2010-11-05 DIAGNOSIS — Z86010 Personal history of colonic polyps: Secondary | ICD-10-CM

## 2010-11-05 DIAGNOSIS — K219 Gastro-esophageal reflux disease without esophagitis: Secondary | ICD-10-CM

## 2010-11-05 DIAGNOSIS — I8500 Esophageal varices without bleeding: Secondary | ICD-10-CM

## 2010-11-05 DIAGNOSIS — K7460 Unspecified cirrhosis of liver: Secondary | ICD-10-CM

## 2010-11-05 NOTE — Progress Notes (Signed)
HISTORY OF PRESENT ILLNESS:  [name] is a 60 y.o. 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 He has been seen at [facility] liver clinic. Complications have included bleeding esophageal varices and mild transient ascites. He was last seen in this office September 2011 after being lost to followup. Blood work at that time was stable with white blood cell count 3.1, platelet count 46,000, hemoglobin 13.1, normal BUN and creatinine of 12 and 0.6 respectively, INR 1.3, and total albumin 3.6. Bilirubin 1.4. He subsequently underwent surveillance upper endoscopy for history of varices. He underwent band ligation x3. Portal gastropathy noted. Repeat endoscopy in 6 months recommended. The patient has been doing well without any obvious complications of his liver disease. No evidence of GI bleeding, encephalopathy, or fluid accumulation. He was instructed to reestablish with [facility], but has failed to do so. He continues to demonstrate suboptimal insight into his disease and an unwillingness to take ownership in his disease. His last colonoscopy, for history of adenomatous polyps, was in 2008. Anticipate a followup in 2013. No lower GI complaints. Review of outside laboratories from 09/25/2010 at the office of Dr. Konrad Felix show Hemoccult negative stool. Normal lipids and PSA. Unchanged BUN and creatinine as well as liver profile and CBC. Except for occasional bloating and heartburn, his GI review of systems is negative. He continues on Protonix for GERD and propranolol for portal hypertension.  REVIEW OF SYSTEMS:  All non-GI ROS negative except for occasional mild ankle edema, muscle cramps, fatigue, insomnia.  Past Medical History  Diagnosis Date  . Pancytopenia   . Reflux esophagitis   . Portal hypertension   . Stricture and stenosis of esophagus   . Diverticulosis of colon (without mention  of hemorrhage)   . Internal hemorrhoids without mention of complication   . Benign neoplasm of colon   . Duodenitis without mention of hemorrhage   . 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 Personal history of other diseases of digestive system   . Personal history of diseases of blood and blood-forming organs   . Personal history of other diseases of digestive system   . Cirrhosis of liver without mention of alcohol   . Unspecified essential hypertension   . Obesity, unspecified   . Calculus of kidney   . Type II or unspecified type diabetes mellitus without mention of complication, not stated as uncontrolled     No past surgical history on file.  Social History [name]  reports that he has never smoked. He has never used smokeless tobacco. He reports that he does not drink alcohol or use illicit drugs.  family history includes Diabetes in his father and Heart disease in his father.  There is no history of Colon cancer.  No Known Allergies     PHYSICAL EXAMINATION: Vital signs: BP 100/62  Pulse 64  Ht 5\' 11"  (1.803 m)  Wt 219 lb 12.8 oz (99.701 kg)  BMI 30.66 kg/m2  Constitutional: generally well-appearing, no acute distress Psychiatric: alert and oriented x3, cooperative Eyes: extraocular movements intact, anicteric, conjunctiva pink Mouth: oral pharynx moist, no lesions Neck: supple no lymphadenopathy Cardiovascular: heart regular rate and rhythm, no murmur Lungs: clear to auscultation bilaterally Abdomen: soft, nontender, nondistended, no obvious ascites, no peritoneal signs, normal bowel sounds, no organomegaly Extremities: no lower extremity edema bilaterally Skin: no lesions on visible extremities Neuro: No focal deficits. No  asterixis.    ASSESSMENT:  #1. 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 He remains well compensated. #2. History of variceal bleed with prior band ligation, most recent October 2011. #3.  GERD. Stable on pantoprazole #4. History of adenomatous colon polyps. Surveillance up-to-date.   PLAN:  #1. Continue current GI medications without change #2. Schedule followup surveillance upper endoscopy with possible band ligation of varices. The nature of the procedure, as well as the risks, benefits, and alternatives were carefully and thoroughly reviewed with the patient. Ample time for discussion and questions allowed. The patient understood, was satisfied, and agreed to proceed.  #3. Reflux precautions, gradual sensible exercise, gradual weight loss #4. Again instructed to reestablish with the [facility] liver clinic #5. Surveillance colonoscopy May 2013

## 2010-11-05 NOTE — Patient Instructions (Signed)
EGD with possible banding [facility] 12/11/10 8:30 am arrive at 7:30 am EGD brochure given for your review.

## 2010-11-19 NOTE — Assessment & Plan Note (Signed)
[facility]                         GASTROENTEROLOGY OFFICE NOTE   [name], [name]                  MRN:          [id number]  DATE:11/12/2006                            DOB:          May 26, 1951    REFERRING PHYSICIAN:  Dr. Lesle Chris.   REASON FOR CONSULTATION:  Abdominal pain and constipation.   HISTORY:  This is a 60 year old white male with type 2 diabetes  mellitus, obesity, hypertension, idiopathic thrombocytopenic purpura,  adenomatous colon polyps and cryptogenic cirrhosis with portal  hypertension.  Patient's liver disease is felt to be on the basis of non-  alcoholic steatohepatitis without biopsy confirmation.  He was initially  introduced to this group in February of 2006, when he presented with a  subacute GI bleed.  Colonoscopy and endoscopy were performed at that  time.  Colonoscopy revealed diminutive colon polyps and minimal  diverticulosis.  Upper endoscopy revealed varices, as well as portal  gastropathy.  He subsequently underwent esophageal band ligation.  A  follow-up endoscopy in March of 2006 revealed successful ablation of  varices.  He was seen back in the office in May and found to be doing  well.  We discussed the possible future role of liver transplantation.  He was to follow up in 6 months.  However, he was lost to followup,  until this time.  The patient states that he has been doing fairly well  until several weeks ago when he developed problems with constipation.  Associated with this was left-sided abdominal pain.  He was eventually  treated with MiraLax, which he states has relieved his constipation, as  well as resolved his abdominal  pain.  In terms of his liver disease, he  denies any evidence of recurrent GI bleeding, ascites, ankle edema, or  encephalopathy.   LABORATORY:  Laboratories obtained September 30, 2006 revealed a hemoglobin  of 14.4, white blood cell count 4.9 and platelet count of 64,000.   His  BUN and creatinine were normal.  As well, his liver function tests were  normal.  Serum albumin was normal at 3.9.  Patient inquires today about  surveillance endoscopy, to assure no recurrence of his varices.  Also,  inquires about surveillance colonoscopy.  He is concerned, regarding his  recent constipation and abdominal pain and prior history of  tubulovillous adenoma with high-grade dysplasia.   PAST MEDICAL HISTORY:  As above.   PAST SURGICAL HISTORY:  None.   ALLERGIES:  NO KNOWN DRUG ALLERGIES.   CURRENT MEDICATIONS:  1. Glucophage 500 mg b.i.d.  2. Glucotrol 10 mg in the morning.  3. Glipizide ER 10 mg daily.  4. He also uses BC powders as needed.   PHYSICAL EXAMINATION:  GENERAL:  Overweight, otherwise well-appearing  male in no acute distress.'  VITAL SIGNS:  His blood pressure is 94/62, heart rate 68 and regular,  weighs 220 pounds.  HEENT:  Sclerae anicteric.  Conjunctivae are pink.  Oral mucosa is  intact with no adenopathy.  LUNGS:  Clear.  HEART:  Regular.  ABDOMEN:  Obese and soft without tenderness, mass or hernia.  Spleen tip  is palpable.  EXTREMITIES:  Without edema.  NEUROLOGIC:  He is intact without asterixis.   IMPRESSION:  1. Recent problems with constipation, likely functional.  Improved      with MiraLax.  2. Problems with left-sided abdominal pain, secondary to constipation.      Now resolved with improved bowel habits.  3. History of tubulovillous adenoma with high-grade dysplasia.  Follow-      up colonoscopy originally planned for February of 2009.  4. History      of cryptogenic cirrhosis felt to be on the basis of non-alcoholic      steatohepatitis.  4. History of portal hypertension with varices and portal gastropathy.      Last surveillance endoscopy in March of 2006.  No evidence of      decompensated liver disease at present.   RECOMMENDATIONS:  1. Continue MiraLax for constipation.  2. Schedule colonoscopy to evaluate  constipation, abdominal pain, and      provide colorectal neoplasia surveillance.  The nature of the      procedure as well as the risks, benefits and alternatives have been      reviewed.  He understood and agreed to proceed.  3. Surveillance (variceal) endoscopy.  The nature of the procedure, as      well as the risks, benefits and alternatives have been reviewed.      He understood and agreed to proceed.  4. Ongoing general medical care with Dr. Andee Poles or Merla Riches.     Wilhemina Bonito. Marina Goodell, MD  Electronically Signed    JNP/MedQ  DD: 11/12/2006  DT: 11/12/2006  Job #: [id number]

## 2010-11-19 NOTE — Assessment & Plan Note (Signed)
[facility]                         GASTROENTEROLOGY OFFICE NOTE   DAMEL, QUERRY                  MRN:          [id number]  DATE:12/03/2006                            DOB:          [birth year]    HISTORY:  Mr. Senft presents today for followup. He is a 60 year old  with diabetes mellitus, obesity, hypertension, idiopathic  thrombocytopenia purpura, adenomatous colon polyps and cryptogenic  cirrhosis with portal hypertension felt secondary to nonalcoholic  steatohepatitis. He was evaluated in the office Nov 12, 2006 for  abdominal pain and constipation. See that dictation. With MiraLax, his  constipation and abdominal pain resolved. On Nov 19, 2006, he underwent  colonoscopy and upper endoscopy. Colonoscopy revealed 2 diminutive colon  polyps which were removed and returned benign. Follow up in 5 years  recommended. Upper endoscopy revealed trivia esophageal varices not  necessitating banding. He also had portal gastropathy as well as erosive  duodenitis. Helicobacter pylori testing was negative. The etiology of  his duodenitis was felt to be secondary to [facility] Powder use. He has  discontinued BC's and was placed on Prevacid 30 mg daily. Since that  time, he has felt well. No further problems with pain or constipation.  He is not needing MiraLax. I have reviewed his endoscopic procedures in  great detail. We also discussed in great detail issues surrounding his  liver disease. I did suggest that he be evaluated at a tertiary care  center for possible enrollment in a study or studies for patient's with  nonalcoholic steatohepatitis. He may require liver biopsy. Also, should  liver disease decompensate, he may need to be known for the purposes of  possible transplantation.   CURRENT MEDICATIONS:  1. Glucophage 500 mg b.i.d.  2. Glucotrol 10 mg daily.  3. Prevacid 30 mg daily.   REVIEW OF SYSTEMS:  The patient's only complaint since his last  visit is  that of ankle edema. Right greater that left. No associated pain or  other symptoms. He states it seems to be getting better.   PHYSICAL EXAMINATION:  Well-appearing male in no acute distress. He is  alert and oriented. Blood pressure is 112/54, heart rate 72 and regular,  weight 224.8 pounds.  HEENT:  Sclerae are anicteric.  His abdomen is obese and soft without tenderness, mass or hernia. No  obvious ascites.  EXTREMITIES:  Reveal 1+ pitting edema and trace pitting edema in the  left.   IMPRESSION:  1. Chronic liver disease with portal hypertension felt secondary to      nonalcoholic steatohepatitis. Problems with portal hypertension      have included variceal bleeding, which previously required banding,      portal gastropathy, and possibly, recent problems with edema.  2. History of tubulovillous adenoma with high-grade dysplasia. Recent      colonoscopy with diminutive polyps only.  3. Erosive duodenitis secondary to nonsteroidal anti-inflammatory      drugs.  4. Functional constipation. Resolved.   RECOMMENDATIONS:  1. Advised to watch sodium intake to 2 grams daily.  2. The patient has agreed to check on his insurance regarding possible  evaluation at [facility]  3. Ongoing general medical care with Dr. Cleta Alberts.  4. GI follow up in 6 months unless interval questions or problems.     Wilhemina Bonito. Marina Goodell, MD  Electronically Signed    JNP/MedQ  DD: 12/03/2006  DT: 12/03/2006  Job #: [id number]   cc:   Brett Canales A. Cleta Alberts, M.D.

## 2010-11-19 NOTE — Assessment & Plan Note (Signed)
[facility]                         GASTROENTEROLOGY OFFICE NOTE   MEARL, OLVER                  MRN:          [id number]  DATE:07/14/2007                            DOB:          1951-04-05    HISTORY:  [name] presents today for followup.  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  His  initial diagnosis of liver disease was made in February of 2006, when he  presented with an upper GI bleed and microcytic anemia.  Upper endoscopy  at that time revealed grade 2 varices without stigmata of bleeding, as  well as portal gastropathy.  He subsequently underwent banding with  obliteration of the varices.  He has been followed sporadically since  that time.  Overall, he has been fairly well-compensated.  He has had  some transient problems with ankle edema.  He was hospitalized in late  November 2008 with an acute gastroenteritis.  His bilirubin was elevated  above baseline at that time.  He is also known to have gallstones.  He  recovered from his illness and presents today for followup.  Since his  hospital discharge, he has done well, no further abdominal complaints.  His jaundice has resolved.  He is back to work, full-time,at [employer].   We have discussed before referral to a tertiary care center for  evaluation, particularly if he may need to be considered for liver  transplantation at some point in the future.  Until this time, he has  not shown interest.  His insight to his disease seems to be suboptimal,  despite repeat counseling and detailed discussions.  He does tell me at  this time he is interested in tertiary hepatology evaluation.  He states  that he is not certain that he could have significant liver disease,  since he is feeling  so well.  I told him I would be happy to arrange  such referral.   His only other issue today is wondering when he should have surveillance  colonoscopy.  His last colonoscopy was in May of 2008 with two  diminutive polyps found and removed.  Followup in five years was  recommended.  He thought it might be best to have his colonoscopy done  this year, as his insurance company will pay for it...  I told him  that would be an inappropriate indication.   He denies symptoms of encephalopathy, bleeding, or edema.   CURRENT MEDICATIONS INCLUDE:  1. Prevacid 30 mg daily.  2. Iron sulfate.  3. Glipizide ER 10 mg daily.  4. Metformin 1000 mg b.i.d.   PHYSICAL EXAM:  Finds a well-appearing male in no acute distress.  Blood pressure is 128/70, heart rate 70, weight is 217.2 pounds.  HEENT:  Sclerae are anicteric.  Conjunctivae are pink.  Oral mucosa is  intact.  Tongue hue is blue.  Face reveals accentuated vascular pattern  in the malar region.  LUNGS:  Clear to auscultation and percussion.  HEART:  Regular.  ABDOMEN:  Obese and soft without tenderness, masses or hernia.  Spleen  tip is palpable.  EXTREMITIES:  Reveal no edema.  NEUROLOGICALLY:  He is alert and oriented.  No asterixis.   IMPRESSION:  1. Cryptogenic cirrhosis, felt secondary to nonalcoholic      steatohepatitis.  Associated portal hypertension.  2. History of esophageal varices, status post endoscopic banding.  No      significant varices on most recent endoscopy in May of 2008.  3. History of portal gastropathy.  4. History of erosive duodenitis.  5. History of tubulovillous adenoma with high-grade dysplasia on      initial colonoscopy.  Most recent colonoscopy with diminutive      polyps only.  6. Multiple general medical problems.   RECOMMENDATIONS:  1. No change in medical management from GI perspective.  2. Referral to Dr. Clair Gulling at [facility] regarding evaluation of      patient's liver disease.  It is  possible he could be a candidate      for research study, if it is indeed confirmed that he has NASH.      Otherwise, it would be good to have him on their radar, should      his disease decompensate and transplantation by a primary treatment      consideration.  3. Surveillance colonoscopy due in May of 2013.  4. Surveillance endoscopy in one year to reassess varices.  5. Resume general medical care with Dr. Cleta Alberts.     Wilhemina Bonito. Marina Goodell, MD  Electronically Signed    JNP/MedQ  DD: 07/14/2007  DT: 07/14/2007  Job #: [id number]   cc:   Div of Gastroenterology [address], [address] Gardiner Barefoot MD, [place].  [facility]  Brett Canales A. Cleta Alberts, M.D.

## 2010-11-19 NOTE — H&P (Signed)
[name], [name]               ACCOUNT NO.:  [id number]   MEDICAL RECORD NO.:  [id number]          PATIENT TYPE:  INP   LOCATION:  0103                         FACILITY:  [facility]   PHYSICIAN:  Eduard Clos, MDDATE OF BIRTH:  June 04, 1951   DATE OF ADMISSION:  05/12/2008  DATE OF DISCHARGE:                              HISTORY & PHYSICAL   PRIMARY CARE PHYSICIAN:  Is located in [place], [place].   PRIMARY GASTROENTEROLOGIST:  Wilhemina Burch. Marina Goodell, MD   CHIEF COMPLAINT:  Throwing up blood.   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  He  presented with complaints of throwing up blood.  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  He went  to the bathroom and he had dark stools.  The patient resumed driving,  wherein after half an hour he started getting nauseated with intense  epigastric pain.  He got out of the truck and threw up blood three  times.  He decided to come to ER, when he also threw up twice.  The  patient stated the vomitus was frank blood.  He denies any associated  dizziness, shortness of breath, chest pain, loss of consciousness,  weakness of limbs or any dysuria, diarrhea or discharges.  The patient's  hemoglobin is around 13.6 with a hematocrit of 40.   The patient has been admitted for further evaluation and management of  his GI bleed.  The patient's abdominal pain is largely controlled after  he got a dose of morphine.  The patient had a similar symptom where he  had intense epigastric pain and felt nauseated.  He had gone to his  primary care physician for this, and told that his EKG was fine; but he  will need a nuclear stress test to rule out any cardiac problems.  He  was started on Zocor and aspirin.  He has been taking these medications  for the last one  week.  The patient symptoms at that time had resolved  spontaneously.  Presently he denies any chest pain.   PAST MEDICAL HISTORY:  1. 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  2. Diabetes mellitus type 2.  3. Hyperlipidemia.   PAST SURGICAL HISTORY:  He has had endoscopy with variceal banding in  2006 for grade 2 esophageal varices.   MEDICATIONS ON ADMISSION:  1. Metformin 1000 mg twice daily.  2. Prevacid 30 mg daily.  3. Aspirin 81 mg p.o. daily.  4. Zocor 10 mg daily.   ALLERGIES:  NO KNOWN DRUG ALLERGIES.   FAMILY HISTORY:  Noncontributory.   SOCIAL HISTORY:  The patient lives with his wife.  He denies smoking  cigarettes, drinking alcohol or using illegal drugs.   REVIEW OF SYSTEMS:  As per history of present illness; nothing else  significant.   PHYSICAL EXAMINATION:  The patient examined at bedside; not in acute  distress.  VITAL SIGNS:  Blood pressure 156/79,  pulse 54 per minute, temperature  97.2, respirations 18 per minute, O2 saturation 99%.  HEENT: Anicteric.  No pallor.  CHEST:  Bilateral air entry present.  No rhonchi and no crepitation.  HEART:  S1 and S2 heard.  ABDOMEN:  Soft, nontender.  Bowel sounds heard.  No guarding or  rigidity.  CNS:  The patient is alert, awake and oriented to time, place and  person.  Motor strength in upper lower extremities 5/5.  EXTREMITIES:  Peripheral pulses felt.  No edema.   LABORATORY STUDIES:  CBC:  WBC 7.2, hemoglobin 13.6, hematocrit 40,  platelets 72, neutrophils 84%.  PT 16.2, INR 1.3.  Complete Metabolic  Panel:  Sodium 137, potassium 3.9, chloride 101, glucose 308, BUN 16,  creatinine 0.7/  Total bilirubin 1.7, direct bilirubin 0.4, alkaline  phosphatase 52, AST 25, ALT 24, total protein 7.1, albumin 3.6, lipase  28.  Chest X-Ray: Borderline cardiomegaly, possible small hiatal hernia.   ASSESSMENT:  1. Gastrointestinal bleed with a history of esophageal varices and      banding.  2. 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  3. Thrombocytopenia.  4. Diabetes mellitus type 2, uncontrolled.  5. History of hyperlipidemia.   PLAN:  Will admit the patient to telemetry.  At this time the patient is  hemodynamically stable.  We will place the patient on IV fluids with CBC  checks every 6 hours.  Will type and cross-match 4 units of packed red  blood cells and hold.  Will start the patient on octreotide drip and  Protonix IV infusion.  Will keep the patient n.p.o. We will give a dose  of Lantus 5 units, as the patient's blood sugar is more than 300 at this  time.  Will place on sliding-scale insulin coverage.  I have already  discussed with the gastroenterologist on call, Dr. Loreta Ave, who is planning  to do endoscopy in the morning; and will follow their recommendations.      Eduard Clos, MD  Electronically Signed     ANK/MEDQ  D:  05/12/2008  T:  05/12/2008  Job:  [id number]   cc:   Wilhemina Burch. Marina Goodell, MD  [address]  [place]  [place] [address]

## 2010-11-19 NOTE — Assessment & Plan Note (Signed)
[name]                         GASTROENTEROLOGY OFFICE NOTE   COMPTON, BRIGANCE                  MRN:          [id number]  DATE:03/16/2007                            DOB:          October 25, 1950    REASON FOR EVALUATION:  Anemia.   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  He was last evaluated in the  office Dec 03, 2006.  See that dictation for details.  At that time, he  was advised in regards to a sodium-restricted diet.  He has been  compliant.  He was also asked to evaluate different options for possible  liver transplant evaluation.  That has not been pursued.  He was asked  to follow up in 6 months.  He recently saw Dr. Cleta Alberts and was found to be  anemic with microcytic indices consistent with iron deficiency.  He was  also seen by hematology.  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  The  patient has denied any melena or hematochezia.  He has felt well.  He is  on oral iron, and is anticipating iron infusion.   CURRENT MEDICATIONS:  1. Glucophage 500 mg b.i.d.  2. Prevacid 30 mg daily.  3. Iron sulfate 1 daily.  4. Glipizide ER 10 mg daily.  5. Multivitamin.  6. He uses BC Powders periodically.   PHYSICAL EXAMINATION:  Well-appearing male in no acute distress.  Blood pressure 120/70, heart rate is 60, weight is 222 pounds.  HEENT:  Sclerae anicteric.  LUNGS:  Clear.  HEART:  Regular.  ABDOMEN:  Soft without tenderness, masses or hernias.  Spleen tip is  palpable.  EXTREMITIES:  Without edema.  NEUROLOGICALLY:  He is intact with no asterixis.   IMPRESSION:  1. Problems with anemia. Multifactorial.  Certainly an element of      splenic sequestration.  However, I  believe his iron deficiency is      secondary to chronic gastrointestinal blood loss from known portal      gastropathy.  2. Cryptogenic cirrhosis, felt secondary to nonalcoholic      steatohepatitis.Associated portal hypertension.  3. History of tubulovillous adenoma with high grade dysplasia.  4. Erosive duodenitis, secondary to non-steroidal anti-inflammatory      drugs.   RECOMMENDATIONS:  1. Avoid non-steroidal anti-inflammatory drugs given the history of      erosive duodenitis.  2. Continue Prevacid.  3. Agree with p.r.n. transfusion as well as iron replacement therapy.  4. Periodic check of hemoglobin with Dr. Cleta Alberts or hematology.  5. Regular gastrointestinal followup in 6 months, sooner if needed.     Wilhemina Bonito. Marina Goodell, MD  Electronically Signed    JNP/MedQ  DD: 03/16/2007  DT: 03/17/2007  Job #: [id number]   cc:   Weston Settle, MD  [place] Cleta Alberts, M.D.

## 2010-11-19 NOTE — H&P (Signed)
[name], [name]               ACCOUNT NO.:  [id number]   MEDICAL RECORD NO.:  [id number]          PATIENT TYPE:  INP   LOCATION:  1608                         FACILITY:  [facility]   PHYSICIAN:  Iva Boop, MD,FACGDATE OF BIRTH:  04-11-1951   DATE OF ADMISSION:  06/06/2007  DATE OF DISCHARGE:                              HISTORY & PHYSICAL   CHIEF COMPLAINT:  Jaundice, myalgias and fever.   HISTORY OF PRESENT ILLNESS:  This is a 60 year old, white man followed  by Dr. Yancey Flemings for cryptogenic cirrhosis and portal hypertension  thought secondary to nonalcoholic steatohepatitis.  He is in his usual  health, doing reasonably well until about 5 days ago.  A neighbor had  been ill with a viral illness.  On Wednesday, 5 days ago, he started to  feel aching, unwell and this persisted.  He had upper abdominal pain and  aching.  He subsequently vomited 1-2 days ago, several times overnight  and felt somewhat better.  He is not really complaining of diarrhea or  constipation.  He has not had any bleeding.  He had a temperature up to  101.  He aches from head to toe including his ears.  The pain in the  abdomen persists, but is somewhat better.  He has never really felt like  this before.  He was seen by Dr. Cleta Alberts and he had a CBC in the office  there with a white count of 4200, hemoglobin 14, platelet count of  76,000.  He has known thrombocytopenia.  His blood sugar was 125.  His  wife has been well.  He does have a history of gallstones apparently on  previous ultrasound, but he does not indicate ever having problems like  this.  He has not been using Tylenol.  He did take an Advil the other  day.  He does not use alcohol.   MEDICATIONS:  1. Prevacid 30 mg daily.  2. Glucophage presumably 500 mg b.i.d.  3. Glucotrol, he thinks 10 mg daily (glipizide ER).  4. Ferrous sulfate.  5. Occasional Advil.   ALLERGIES:  No known drug allergies.   PAST MEDICAL HISTORY:  1. Nonalcoholic  steatohepatitis.  2. Portal hypertension complicated by esophageal varices with previous      bleeding.  Last EGD on Nov 19, 2006, with portal gastropathy,      erosive duodenitis, grade 1 esophageal varices and a small hiatal      hernia.  3. Colonoscopy.  There is a history of a polyp in 2001.  He had a 3-mm      polyp removed from the descending colon on Nov 19, 2006, a 1-mm      polyp removed from the transverse colon, internal hemorrhoids as      well.  No obvious hemorrhoids.  These were tubular adenomas.  He      had a large tubular adenoma with high-grade dysplasia on March 27, 2003.  At that time, Dr. Randa Evens did the colonoscopy.  4. Diabetes mellitus type 2.  5. Obesity.  6. Sinus problems.  FAMILY HISTORY:  Positive for diabetes, no colon cancer.  No liver  disease reported.   The duodenitis was thought to be due to [facility] usage.  His H. pylori biopsy  was negative.   SOCIAL HISTORY:  Married.  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  No alcohol.  No tobacco.  His children are grown.  He is here with his wife.   REVIEW OF SYSTEMS:  Anorexia and jaundice.  Urine is quite dark.  He  aches all over as mentioned.  Some low back pain.  Some sinus pressure.  All other systems negative except for a headache the other day.   PHYSICAL EXAMINATION:  GENERAL:  An acutely, chronically ill, white man.  VITAL SIGNS:  Temperature 98.7, pulse 86, blood pressure 114/78,  respirations 20.  HEENT:  The eyes are icteric.  Mouth and palate are icteric.  Free of  lesions.  The mucosa is slightly dry.  The teeth are in fair repair.  There is evidence of previous dental work with multiple fillings.  NECK:  Supple without thyromegaly or mass.  LUNGS:  Clear.  On the upper trunk, there are a few spider angiomata.  The skin is jaundiced.  He has no gynecomastia.  HEART:  S1, S2.  I hear no  rubs, murmurs or gallops.  ABDOMEN:  Soft.  Liver is palpable in the mid right upper quadrant over  to the epigastrium below the xiphoid about one and half  fingerbreadths  below.  It is firm.  I cannot palpate the spleen.  He is minimally to  mildly tender in the right upper quadrant and epigastrium.  There is  certainly no Murphy's sign.  He is obese.  He could have ascites.  There  is no neck or supraclavicular or groin adenopathy.  EXTREMITIES:  Lower extremities free of edema.  There are a few healing  ulcers in the pretibial area.  He appears alert and oriented x3.  NEUROLOGIC:  Grossly nonfocal.   LABORATORY DATA AND X-RAY FINDINGS:  Lab data from March 2008, showed a  platelet count of 64,000, white count 4.9, hemoglobin 14.4.  His BUN and  creatinine were normal.  Albumin was 3.9.   ASSESSMENT:  1. Jaundice and illness with myalgias, arthralgias and fever.  He has      cirrhosis from nonalcoholic steatohepatitis.  This does seem like a      viral syndrome.  Certainly, pancreatitis (biliary pancreatitis),      cholecystitis are in the differential.  Intrinsic worsening of      liver disease is possible as well.  He is not aware of any      vaccinations for liver disease at this time.  2. History of portal hypertension with varices.  3. Diabetes mellitus type 2.  4. Erosive duodenitis secondary to nonsteroidal anti-inflammatory drug      use in the past.  5. Iron deficiency anemia.  6. Thrombocytopenia related to portal hypertension presumably.   RECOMMENDATIONS:  1. Admit and hydrate.  2. Labs to include CMET, coagulations, CBC with differential, amylase      and lipase.  3. Abdominal ultrasound will be ordered.  4. Consider serologic testing for acute hepatitis.  Certainly,      depending upon his hepatitis A and B status, he could use vaccines      eventually.  5. Further  plans pending clinical course.  Plan is for supportive care      at this time.  I do not see any  indication for antibiotics in what      I see.  If he has ascites we will need to perform a paracentesis.      Other imaging with MR and CT could be indicated.  His abdominal      exam and was not significantly tender and in some way and he said      he      was feeling better.  I suspect he is dehydrated and doing poorly      from the overall illness rather than an acute abdominal process,      but we will await these studies.   I appreciate the opportunity to care for this patient.      Iva Boop, MD,FACG  Electronically Signed     CEG/MEDQ  D:  06/06/2007  T:  06/07/2007  Job:  [id number]   cc:   Wilhemina Bonito. Marina Goodell, MD  [address]  [place]  [place] [address]   Brett Canales A. Cleta Alberts, M.D.  Fax: [phone]   [place] Kirby Funk, MD  [address]  [place] [address]

## 2010-11-19 NOTE — Discharge Summary (Signed)
[name], [name]               ACCOUNT NO.:  [id number]   MEDICAL RECORD NO.:  [id number]          PATIENT TYPE:  INP   LOCATION:  1416                         FACILITY:  [facility]   PHYSICIAN:  Peggye Pitt, M.D. DATE OF BIRTH:  September 27, 1950   DATE OF ADMISSION:  05/12/2008  DATE OF DISCHARGE:  05/19/2008                               DISCHARGE SUMMARY   DISCHARGE DIAGNOSES:  1. Upper gastrointestinal bleed secondary to esophageal varices.  2. Acute blood loss anemia.  3. Thrombocytopenia.  4. Cryptogenic cirrhosis.  5. Klebsiella bacteremia with questionable spontaneous bacterial      peritonitis.  6. Type 2 diabetes.  7. Hyperlipidemia.   DISCHARGE MEDICATIONS:  1. Ciprofloxacin 500 mg p.o. b.i.d. for 10 days.  2. Propranolol 20 mg p.o. daily.  3. Protonix 40 mg p.o. daily.  4. Simvastatin 10 mg p.o. nightly.  5. Metformin 1000 mg p.o. b.i.d.  6. He has been instructed to stop taking his aspirin.   DISPOSITION AND FOLLOWUP:  Patient is discharged home in stable  condition and his hemoglobin has remained stable.  He will follow up  with Dr. Marina Goodell on December 8 at 2:15 at which time an EGD with banding  will need to be performed.  He has been instructed to return to the  emergency department if he experiences any further blood loss.   CONSULTATIONS THIS HOSPITALIZATION:  Dr. Marina Goodell and Dr. Gerilyn Pilgrim with  [facility].   IMAGES AND PROCEDURES THIS HOSPITALIZATION:  1. Chest x-ray on May 12, 2008 that showed borderline cardiomegaly      with a small hiatal hernia.  2. Abdominal ultrasound on May 16, 2008 that showed      cholelithiasis, splenomegaly and possibly fatty infiltration of the      liver.  3. EGD performed by Dr. Loreta Ave on May 13, 2008 that showed grade II      to III esophageal varices with no evidence of fresh blood.   HISTORY AND PHYSICAL EXAMINATION:  For full details please refer to  history and physical dictated by Dr. Toniann Fail on May 12, 2008 but  in brief [name] is a 60 year old white man with a history of  cryptogenic cirrhosis who presented with complaints of hematemesis.  He  felt epigastric discomfort and had some melena though he resumed driving  and half an hour later started having hematemesis x3 episodes, for that  reason he came into the emergency department and we admitted him for  further evaluation and treatment.   HOSPITAL COURSE BY ACTIVE PROBLEM:  1. His upper gastrointestinal bleed secondary to esophageal varices.      He was immediately taken to esophagogastroduodenoscopy where the      varices were found and he was banded.  He was started on      propranolol.  He will need to see Dr. Marina Goodell in about 3 - 4 weeks      for rebanding.  2. Acute blood loss anemia which is secondary to problem #1.  He did      not need any transfusions this hospitalization.  His hemoglobin  upon discharge is 10.  Given absence of coronary artery disease we      deemed that his transfusion threshold would be less than 8.  3. Thrombocytopenia which was stable throughout his hospitalization,      we believe it is secondary to cirrhosis.  His platelets upon      diagnosis are 70,000.  4. He had Gram negative rod bacteremia that was later speciated to      Klebsiella.  He was on 6 days of intravenous Rocephin while in the      hospital to which the Klebsiella was sensitive.  It is also      sensitive to Cipro so we will discharge him on 10 days of b.i.d.      p.o. Cipro.  Because of concerns that this might be coming from      possible spontaneous bacterial peritonitis a paracentesis was      performed, however, did not show any organisms although there was      always a concern that this could possibly be partially treated      given the fact that he was on intravenous antibiotics.  5. Vital signs on day of discharge:  Blood pressure 116/62, heart rate      56, respirations 18, O2 sats 98% on room air with a  temperature of      98.2.  6. Labs on day of discharge:  Sodium 137, potassium 4.4, chloride 108,      bicarb 26, BUN 3, creatinine 0.69, glucose of 193, WBCs 4.1,      hemoglobin 10 and platelets of 70.      Peggye Pitt, M.D.  Electronically Signed     EH/MEDQ  D:  05/19/2008  T:  05/19/2008  Job:  [id number]   cc:   Wilhemina Bonito. Marina Goodell, MD  [address]  [place]  [place] [address]   Anselmo Rod, M.D.  Fax: [phone]

## 2010-11-19 NOTE — Op Note (Signed)
NAMESERJIO, [name]               ACCOUNT NO.:  [id number]   MEDICAL RECORD NO.:  [id number]          PATIENT TYPE:  INP   LOCATION:  1416                         [facility]AC[facility]  [facility]   PHYSICIAN:  Anselmo Rod, M.D.  DATE OF BIRTH:  [birth year]   DATE OF PROCEDURE:  05/13/2008  DATE OF DISCHARGE:                               OPERATIVE REPORT   PROCEDURE PERFORMED:  Diagnostic esophagogastroduodenoscopy.   ENDOSCOPIST:  Anselmo Rod, MD   INSTRUMENT USED:  Pentax video panendoscope.   INDICATIONS FOR PROCEDURE:  A 60 year old white male with a history of  cirrhosis secondary to nonalcoholic steatohepatitis undergoing EGD for  hematemesis/coffee-ground emesis.  The patient has also had some melenic  stools and epigastric pain, rule out bleeding, varices, ulcer disease,  etc.   PREPROCEDURE PREPARATION:  Informed consent was procured from the  patient.  The patient fasted for 8 hours prior to the procedure.  Risks  and benefits of the procedure were discussed with the patient in great  detail.  The patient was started on octreotide on admission and  maintained on a continuous infusion.   PREPROCEDURE PHYSICAL:  The patient had stable vital signs.  Neck  supple.  Chest clear to auscultation.  S1 and S2 regular.  Abdomen soft,  nontender with normal bowel sounds.   DESCRIPTION OF PROCEDURE:  The patient was placed in the left lateral  decubitus position, sedated with 75 mcg of Fentanyl and 8 mg of Versed  given intravenously in slow incremental doses.  Once the patient was  adequately sedated and maintained on low-flow oxygen continuous cardiac  monitoring.  The Pentax video panendoscope was advanced through the  mouthpiece over the tongue into the esophagus under direct vision.  There were grade 2-3 varices in the distal esophagus.  There was no  evidence of fresh bleeding from the varices.  Varices were noted  predominantly from 30 to 40 cm.  There was no evidence of fresh  bleeding  as mentioned above.  Large amount of debris was noted in the stomach,  question gastroparesis.  The gastric mucosa was not visualized as there  was large amount of old and fresh blood in the stomach.  The proximal  small bowel was not visualized.   IMPRESSION:  1. Grade 2-3 esophageal varices from 30-40 cm.  2. Large amount of old and fresh blood in the stomach with debris,      question gastroparesis.  3. Proximal small bowel not clearly visualized.   RECOMMENDATIONS:  1. The patient will be maintained on clear liquid diet and a repeat      endoscopy planned tomorrow or the day after.  2. Continue serial CBCs.  3. Continue octreotide for now.  4. Further recommendations will be made in followup.      Anselmo Rod, M.D.  Electronically Signed     JNM/MEDQ  D:  05/15/2008  T:  05/16/2008  Job:  [id number]   cc:   Wilhemina Bonito. Marina Goodell, MD  [address]  [place]  [place] [address]   Konrad Felix  [address]  [place]  [place] [address]

## 2010-11-22 NOTE — Op Note (Signed)
   NAME:  [name], [name]                     ACCOUNT NO.:  [id number]   MEDICAL RECORD NO.:  [id number]                   PATIENT TYPE:  AMB   LOCATION:  ENDO                                 [facility]ACILITY:  [facility]   PHYSICIAN:  James L. Malon Kindle., M.D.          DATE OF BIRTH:  [birth year]   DATE OF PROCEDURE:  DATE OF DISCHARGE:                                 OPERATIVE REPORT   PROCEDURE:  Colonoscopy and polypectomy.   MEDICATIONS:  Fentanyl 62.5 mcg, Versed 6 mg IV.   SCOPE:  Olympus colonoscope.   INDICATIONS FOR PROCEDURE:  Hemoccult positive stool.   DESCRIPTION OF PROCEDURE:  The procedure had been explained to the patient  and consent obtained. With the patient in the left lateral decubitus  position, the adult Olympus scope was inserted and advanced. We were able to  advance up to the cecum without difficulty. The ileocecal valve and  appendiceal orifice were seen. The prep was quite good. The scope was  withdrawn and the cecum, ascending colon, hepatic flexure, transverse colon,  splenic flexure, descending and sigmoid colon were seen well. At 35 cm from  the anal verge on a fairly stalk was a 2 cm polyp, this was removed with the  snare and recovered. The scope was reinserted, there was no active bleeding.  No other polyps were seen throughout the sigmoid or rectum. The scope was  withdrawn. The patient tolerated the procedure well.   ASSESSMENT:  Large sigmoid colon polyp removed.   PLAN:  Will check path unless something unexpected turns up such as a cancer  will recommend repeating this procedure in three years.                                               James L. Malon Kindle., M.D.    Waldron Session  D:  03/27/2003  T:  03/27/2003  Job:  [id number]   cc:   Brett Canales A. Cleta Alberts, M.D.  [address]  [place]  [place] [address]  Fax: [phone]

## 2010-11-22 NOTE — Discharge Summary (Signed)
NAMEMAURIE, OLESEN               ACCOUNT NO.:  [id number]   MEDICAL RECORD NO.:  [id number]          PATIENT TYPE:  INP   LOCATION:  1608                         FACILITY:  [facility]   PHYSICIAN:  Wilhemina Bonito. Marina Goodell, MD      DATE OF BIRTH:  [birth year]   DATE OF ADMISSION:  06/06/2007  DATE OF DISCHARGE:  06/08/2007                               DISCHARGE SUMMARY   ADMITTING DIAGNOSES:  86. A 60 year old white male with jaundice and acute illness associated      with myalgias, arthralgias and fever.  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  Rule out acute viral syndrome.  Rule out possible      cholecystitis.  2. History of portal hypertension with varices.  3. Adult-onset diabetes mellitus type 2.  4. Erosive duodenitis secondary to nonsteroidal antiinflammatory      drugs.  5. Iron-deficiency anemia.  6. Thrombocytopenia felt related to portal hypertension and      splenomegaly.   DISCHARGE DIAGNOSES:  1. Acute illness, probable viral gastroenteritis with nausea, vomiting      and myalgias, low-grade fever and reactive hepatitis, resolving.  2. 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  3. Adult-onset diabetes mellitus.  4. Cholelithiasis without evidence for cholecystitis.   CONSULTATIONS:  None.   PROCEDURES:  Upper abdominal ultrasound.   HISTORY:  Silvestre is a 60 year old white male followed by Dr. 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  At this time he presented with acute  illness, onset about 5 days prior to hospitalization, with diffuse  aching and myalgias, fever at home to 101.  He had upper abdominal  discomfort and aching associated with nausea and vomiting for  approximately 2 days.  He had no  diarrhea, no melena or hematochezia.  He said he ached from head to toe.  He was seen by Dr. Cleta Alberts who he sees  for primary care, had CBC in the office with a white count of 4.2;  hemoglobin 14; platelet count of 76,000.  He appeared to be jaundiced  and he was referred for admission for further diagnostic workup and  supportive management.   LABORATORY STUDIES:  On admission, November 30:  WBC of 4.9, hemoglobin  14.5, hematocrit of 41.1, MCV of 84, platelets 73.  Followup on December  1:  WBC of 4.1, hemoglobin 12.1, hematocrit of 34, platelets 57.  Pro  time 14.4, INR of 1.1.  Sodium 134, potassium 3.6, glucose 69, BUN 11,  creatinine 0.88.  Albumin was 2.9 on admission, total bilirubin 9.4, alk  phos 63, ALT of 64, AST of 65, amylase and lipase both normal.  Followup  on December 2:  Total bilirubin of 6.4, alk phos 68, ALT of 47, AST of  47, albumin 2.3, direct bili was 3.5, indirect bili 2.9.  AFP was done  for followup, was  3.2.  Venous ammonia was 33.   X-RAY STUDIES:  Abdominal ultrasound on November 30:  Nonvisualization  of the pancreas, diffuse increased echogenicity of the liver consistent  with fatty infiltration, gallstones but no evidence for cholecystitis,  and a normal caliber common bile duct.   HOSPITAL COURSE:  The patient was admitted to the service of Dr. Stan Head who was covering the hospital.  He was hydrated.  Baseline labs  were obtained as was abdominal ultrasound.  It was felt he most likely  had a viral syndrome with a reactive hepatitis.  His ultrasound did show  gallstones but no evidence for colicystitis or choledocholithiasis.  His  bilirubin gradually improved without any specific intervention and by  December 2 he was feeling much better, was eating solid food, his nausea  and vomiting had completely resolved as had his abdominal discomfort,  his bilirubin was down to 6.4, and his other parameters were all stable.  He was allowed discharge to  home with instructions to follow up with Dr.  Marina Goodell in the office on December 18 at 11:30 a.m., to call for any  problems in the interim.  We will plan to repeat his labs at that time.   DIET:  Carbohydrate-modified as previous.   MEDICATIONS:  1. Prevacid 30 p.o. daily.  2. Glucophage 500 b.i.d.  3. Glucotrol 10 mg daily.      [name], PA-C      John N. Marina Goodell, MD  Electronically Signed    AE/MEDQ  D:  06/11/2007  T:  06/11/2007  Job:  [id number]   cc:   Brett Canales A. Cleta Alberts, M.D.  Fax: [phone]

## 2010-12-11 ENCOUNTER — Ambulatory Visit (HOSPITAL_COMMUNITY)
Admission: RE | Admit: 2010-12-11 | Discharge: 2010-12-11 | Disposition: A | Discharge status: Home / Self Care | Payer: Managed Care, Other (non HMO) | Source: Ambulatory Visit | Attending: Internal Medicine | Admitting: Internal Medicine

## 2010-12-11 ENCOUNTER — Encounter: Payer: Managed Care, Other (non HMO) | Admitting: Internal Medicine

## 2010-12-11 DIAGNOSIS — K766 Portal hypertension: Secondary | ICD-10-CM

## 2010-12-11 DIAGNOSIS — K2960 Other gastritis without bleeding: Secondary | ICD-10-CM

## 2010-12-11 DIAGNOSIS — K319 Disease of stomach and duodenum, unspecified: Secondary | ICD-10-CM

## 2010-12-11 DIAGNOSIS — I8500 Esophageal varices without bleeding: Secondary | ICD-10-CM

## 2010-12-11 DIAGNOSIS — I8510 Secondary esophageal varices without bleeding: Secondary | ICD-10-CM | POA: Insufficient documentation

## 2011-04-08 LAB — PROTIME-INR
INR: 1.3
INR: 1.4
Prothrombin Time: 16.2 — ABNORMAL HIGH
Prothrombin Time: 17.1 — ABNORMAL HIGH
Prothrombin Time: 17.7 — ABNORMAL HIGH

## 2011-04-08 LAB — TYPE AND SCREEN

## 2011-04-08 LAB — DIFFERENTIAL
Basophils Absolute: 0.1
Eosinophils Absolute: 0
Eosinophils Relative: 2
Lymphocytes Relative: 25
Lymphs Abs: 0.8
Lymphs Abs: 0.9
Monocytes Absolute: 0.3
Monocytes Absolute: 0.7
Neutro Abs: 2
Neutro Abs: 6

## 2011-04-08 LAB — HEPATIC FUNCTION PANEL
AST: 25
Bilirubin, Direct: 0.4 — ABNORMAL HIGH
Indirect Bilirubin: 1.3 — ABNORMAL HIGH
Total Bilirubin: 1.7 — ABNORMAL HIGH

## 2011-04-08 LAB — POCT I-STAT, CHEM 8
Calcium, Ion: 1.14
Creatinine, Ser: 0.7
Glucose, Bld: 308 — ABNORMAL HIGH
Hemoglobin: 13.6
Potassium: 3.9

## 2011-04-08 LAB — CBC
HCT: 26.9 — ABNORMAL LOW
HCT: 27.6 — ABNORMAL LOW
HCT: 27.7 — ABNORMAL LOW
HCT: 27.8 — ABNORMAL LOW
HCT: 28.3 — ABNORMAL LOW
HCT: 28.4 — ABNORMAL LOW
HCT: 29.8 — ABNORMAL LOW
HCT: 37.8 — ABNORMAL LOW
HCT: 41.8
Hemoglobin: 11.1 — ABNORMAL LOW
Hemoglobin: 11.5 — ABNORMAL LOW
Hemoglobin: 12.7 — ABNORMAL LOW
Hemoglobin: 14.1
Hemoglobin: 9.5 — ABNORMAL LOW
Hemoglobin: 9.7 — ABNORMAL LOW
Hemoglobin: 9.8 — ABNORMAL LOW
MCHC: 33.8
MCHC: 34
MCHC: 34.1
MCV: 87.2
MCV: 87.3
MCV: 87.8
MCV: 87.9
MCV: 89
MCV: 89
Platelets: 40 — CL
Platelets: 53 — ABNORMAL LOW
Platelets: 58 — ABNORMAL LOW
Platelets: 62 — ABNORMAL LOW
Platelets: 63 — ABNORMAL LOW
Platelets: 70 — ABNORMAL LOW
Platelets: 72 — ABNORMAL LOW
RBC: 3.16 — ABNORMAL LOW
RBC: 3.22 — ABNORMAL LOW
RBC: 3.35 — ABNORMAL LOW
RBC: 3.58 — ABNORMAL LOW
RBC: 3.79 — ABNORMAL LOW
RBC: 4.22
RBC: 4.3
RDW: 14.1
RDW: 14.3
RDW: 14.4
RDW: 14.4
RDW: 14.6
RDW: 15.3
WBC: 3 — ABNORMAL LOW
WBC: 3.6 — ABNORMAL LOW
WBC: 3.7 — ABNORMAL LOW
WBC: 3.8 — ABNORMAL LOW
WBC: 4.1
WBC: 4.1
WBC: 5.1
WBC: 6.2
WBC: 6.9
WBC: 7.5
WBC: 7.5

## 2011-04-08 LAB — CULTURE, BLOOD (ROUTINE X 2)

## 2011-04-08 LAB — GLUCOSE, CAPILLARY
Glucose-Capillary: 158 — ABNORMAL HIGH
Glucose-Capillary: 174 — ABNORMAL HIGH
Glucose-Capillary: 182 — ABNORMAL HIGH
Glucose-Capillary: 189 — ABNORMAL HIGH
Glucose-Capillary: 193 — ABNORMAL HIGH
Glucose-Capillary: 199 — ABNORMAL HIGH
Glucose-Capillary: 225 — ABNORMAL HIGH
Glucose-Capillary: 229 — ABNORMAL HIGH
Glucose-Capillary: 234 — ABNORMAL HIGH
Glucose-Capillary: 237 — ABNORMAL HIGH
Glucose-Capillary: 240 — ABNORMAL HIGH
Glucose-Capillary: 242 — ABNORMAL HIGH
Glucose-Capillary: 248 — ABNORMAL HIGH
Glucose-Capillary: 251 — ABNORMAL HIGH
Glucose-Capillary: 253 — ABNORMAL HIGH
Glucose-Capillary: 258 — ABNORMAL HIGH
Glucose-Capillary: 259 — ABNORMAL HIGH
Glucose-Capillary: 281 — ABNORMAL HIGH
Glucose-Capillary: 292 — ABNORMAL HIGH
Glucose-Capillary: 295 — ABNORMAL HIGH

## 2011-04-08 LAB — BODY FLUID CULTURE
Culture: NO GROWTH
Gram Stain: NONE SEEN

## 2011-04-08 LAB — BASIC METABOLIC PANEL
BUN: 2 — ABNORMAL LOW
BUN: 3 — ABNORMAL LOW
CO2: 25
Calcium: 7.8 — ABNORMAL LOW
Calcium: 8.4
Calcium: 8.6
Chloride: 102
Creatinine, Ser: 0.67
Creatinine, Ser: 0.69
GFR calc Af Amer: >60
GFR calc Af Amer: >60
GFR calc non Af Amer: >60
GFR calc non Af Amer: >60
GFR calc non Af Amer: >60
GFR calc non Af Amer: >60
Glucose, Bld: 193 — ABNORMAL HIGH
Potassium: 3.1 — ABNORMAL LOW
Potassium: 3.2 — ABNORMAL LOW
Potassium: 3.8
Sodium: 134 — ABNORMAL LOW
Sodium: 135
Sodium: 137

## 2011-04-08 LAB — CARDIAC PANEL(CRET KIN+CKTOT+MB+TROPI)
CK, MB: 0.8
Relative Index: INVALID
Relative Index: INVALID
Total CK: 35
Troponin I: 0.01
Troponin I: 0.02

## 2011-04-08 LAB — COMPREHENSIVE METABOLIC PANEL
Albumin: 2.2 — ABNORMAL LOW
BUN: 10
Chloride: 106
Creatinine, Ser: 0.69
Glucose, Bld: 173 — ABNORMAL HIGH
Total Bilirubin: 2.8 — ABNORMAL HIGH

## 2011-04-08 LAB — BODY FLUID CELL COUNT WITH DIFFERENTIAL
Lymphs, Fluid: 11
Neutrophil Count, Fluid: 4

## 2011-04-08 LAB — LIPID PANEL
Total CHOL/HDL Ratio: 4.2
VLDL: 15

## 2011-04-08 LAB — HEMOGLOBIN A1C: Hgb A1c MFr Bld: 7.8 — ABNORMAL HIGH

## 2011-04-08 LAB — PATHOLOGIST SMEAR REVIEW

## 2011-04-08 LAB — URINE CULTURE
Colony Count: NO GROWTH
Culture: NO GROWTH
Special Requests: NEGATIVE

## 2011-04-08 LAB — LIPASE, BLOOD: Lipase: 28

## 2011-04-08 LAB — MAGNESIUM: Magnesium: 1.7

## 2011-04-14 LAB — CBC
HCT: 34 — ABNORMAL LOW
Hemoglobin: 12.1 — ABNORMAL LOW
MCHC: 35.6
RBC: 4.03 — ABNORMAL LOW

## 2011-04-14 LAB — COMPREHENSIVE METABOLIC PANEL
ALT: 51
Alkaline Phosphatase: 59
BUN: 8
CO2: 22
Calcium: 8.2 — ABNORMAL LOW
GFR calc non Af Amer: >60
Glucose, Bld: 178 — ABNORMAL HIGH
Potassium: 3.3 — ABNORMAL LOW
Sodium: 133 — ABNORMAL LOW

## 2011-04-14 LAB — DIFFERENTIAL
Basophils Relative: 0
Eosinophils Absolute: 0.1 — ABNORMAL LOW
Neutro Abs: 2.8
Neutrophils Relative %: 67

## 2011-04-14 LAB — BILIRUBIN, FRACTIONATED(TOT/DIR/INDIR): Indirect Bilirubin: 3 — ABNORMAL HIGH

## 2011-04-14 LAB — HEPATIC FUNCTION PANEL
Indirect Bilirubin: 2.9 — ABNORMAL HIGH
Total Protein: 6.1

## 2011-04-14 LAB — AMMONIA: Ammonia: 33

## 2011-04-15 LAB — COMPREHENSIVE METABOLIC PANEL
ALT: 64 — ABNORMAL HIGH
Alkaline Phosphatase: 63
CO2: 29
Chloride: 101
Glucose, Bld: 69 — ABNORMAL LOW
Potassium: 3.6
Sodium: 134 — ABNORMAL LOW
Total Protein: 7.2

## 2011-04-15 LAB — CBC
Hemoglobin: 14.5
RBC: 4.86
WBC: 4.9

## 2011-04-15 LAB — AMYLASE: Amylase: 41

## 2011-04-15 LAB — LIPASE, BLOOD: Lipase: 46

## 2011-04-21 LAB — CROSSMATCH: ABO/RH(D): AB POS

## 2011-04-21 LAB — ABO/RH: ABO/RH(D): AB POS

## 2011-06-12 ENCOUNTER — Encounter: Payer: Self-pay | Admitting: Internal Medicine

## 2011-06-12 ENCOUNTER — Ambulatory Visit (INDEPENDENT_AMBULATORY_CARE_PROVIDER_SITE_OTHER): Payer: Managed Care, Other (non HMO) | Admitting: Internal Medicine

## 2011-06-12 VITALS — BP 148/70 | HR 52 | Ht 71.0 in | Wt 220.2 lb

## 2011-06-12 DIAGNOSIS — I8500 Esophageal varices without bleeding: Secondary | ICD-10-CM

## 2011-06-12 DIAGNOSIS — K219 Gastro-esophageal reflux disease without esophagitis: Secondary | ICD-10-CM

## 2011-06-12 DIAGNOSIS — K7689 Other specified diseases of liver: Secondary | ICD-10-CM

## 2011-06-12 DIAGNOSIS — E119 Type 2 diabetes mellitus without complications: Secondary | ICD-10-CM

## 2011-06-12 MED ORDER — PROPRANOLOL HCL 20 MG PO TABS: 20.0000 mg | ORAL_TABLET | Freq: Every day | ORAL | Status: DC

## 2011-06-12 MED ORDER — PANTOPRAZOLE SODIUM 40 MG PO TBEC: 40.0000 mg | DELAYED_RELEASE_TABLET | Freq: Every day | ORAL | Status: DC

## 2011-06-12 NOTE — Progress Notes (Signed)
HISTORY OF PRESENT ILLNESS:  [name] is a 60 y.o. 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 He has been seen at [facility] liver clinic. Complications have included bleeding esophageal varices and mild transient ascites. He was last seen in this office 11/05/2010. See that dictation. He subsequently underwent surveillance endoscopy with band ligation of residual grade 2 varices 12/11/2010. He presents for followup at this time. He has not reestablish with the [facility] liver clinic as requested. He states that since his wife is retired, "I will put her on that job...". I stressed to him the importance of being established without liver clinic. From a GI standpoint, he has done well. He has had no problems with edema or ascites. No evidence for GI bleeding. No issues suggesting encephalopathy. He does state that he has had poor control of diabetes. He sees Dr. Lucianne Muss. Also some blood pressure issues. He sees Dr. Konrad Felix. He needs a refill of his Protonix and propranolol.   REVIEW OF SYSTEMS:  All non-GI ROS negative except for sinus trouble, back pain, muscle cramps, occasional ankle edema, increased thirst, excessive urination.  Past Medical History  Diagnosis Date  . Pancytopenia   . Reflux esophagitis   . Portal hypertension   . Stricture and stenosis of esophagus   . Diverticulosis of colon (without mention of hemorrhage)   . Internal hemorrhoids without mention of complication   . Benign neoplasm of colon   . Duodenitis without mention of hemorrhage   . 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 Personal history of other diseases of digestive system   . Personal history of diseases of blood and blood-forming organs   . Personal history of other diseases of digestive system   . Cirrhosis of liver without mention of alcohol   . Unspecified essential hypertension     . Obesity, unspecified   . Calculus of kidney   . Type II or unspecified type diabetes mellitus without mention of complication, not stated as uncontrolled     History reviewed. No pertinent past surgical history.  Social History Tomasz Steeves Kleier  reports that he has never smoked. He has never used smokeless tobacco. He reports that he drinks alcohol. He reports that he does not use illicit drugs.  family history includes Aneurysm in his mother; Diabetes in his father; and Heart disease in his father and other.  There is no history of Colon cancer.  No Known Allergies     PHYSICAL EXAMINATION: Vital signs: BP 148/70  Pulse 52  Ht 5\' 11"  (1.803 m)  Wt 220 lb 3.2 oz (99.882 kg)  BMI 30.71 kg/m2 General: Obese. Well-developed, well-nourished, no acute distress HEENT: Sclerae are anicteric, conjunctiva pink. Oral mucosa intact. Hypervascular malar region Lungs: Clear Heart: Regular Abdomen: soft, obese, nontender, nondistended, no obvious ascites, no peritoneal signs, normal bowel sounds. No organomegaly. Extremities: No edema Psychiatric: alert and oriented x3. Cooperative . Neuro: No asterixis   ASSESSMENT:  #1. Cryptogenic cirrhosis, likely secondary to NASH. Currently compensated #2. History of bleeding esophageal varices. Last exam with banding of residual varices June 2012. No evidence of rebleeding #3. Known portal gastropathy #4. GERD #5. Multiple general medical problems   PLAN:  #1. Refill propranolol 20 mg daily #2. Repeat upper endoscopy with band ligation of varices, if necessary.The nature of the procedure, as well as the risks, benefits, and alternatives were carefully and  thoroughly reviewed with the patient. Ample time for discussion and questions allowed. The patient understood, was satisfied, and agreed to proceed. We were going to set the patient up from a hospital in January. This was not convenient for him. He will contact the office after the new  year to schedule the exam. We will need to hold his diabetic medications the morning of the procedure to avoid in one to hypoglycemia #3. Refill pantoprazole #4. Reestablish with [facility] liver clinic. Emphatically urged to do so #5. Ongoing general medical care with his endocrinologist and primary provider

## 2011-06-12 NOTE — Patient Instructions (Addendum)
You have been scheduled for an endoscopy with banding at [facility]. Please follow written instructions given to you at your visit today. We have sent the following medications to your pharmacy for you to pick up at your convenience: Protonix, Propranolol,.

## 2011-07-21 ENCOUNTER — Telehealth: Payer: Self-pay | Admitting: Internal Medicine

## 2011-07-21 NOTE — Telephone Encounter (Signed)
Pt needs an egd with banding.

## 2011-07-22 ENCOUNTER — Other Ambulatory Visit: Payer: Self-pay | Admitting: Internal Medicine

## 2011-07-22 NOTE — Telephone Encounter (Signed)
Pt rescheduled for egd with possible banding for 08/01/11 @[facility] , pt to arrive at 9am for a 10am procedure. Pt aware of prep for the procedure and appt date and time. Scheduled with Noreene Larsson [phone].

## 2011-07-29 ENCOUNTER — Encounter (HOSPITAL_COMMUNITY): Payer: Self-pay

## 2011-07-29 ENCOUNTER — Ambulatory Visit (HOSPITAL_COMMUNITY): Admit: 2011-07-29 | Payer: Self-pay | Admitting: Internal Medicine

## 2011-07-29 SURGERY — EGD (ESOPHAGOGASTRODUODENOSCOPY)
Anesthesia: Moderate Sedation

## 2011-08-01 ENCOUNTER — Ambulatory Visit (HOSPITAL_COMMUNITY)
Admission: RE | Admit: 2011-08-01 | Discharge: 2011-08-01 | Disposition: A | Discharge status: Home / Self Care | Payer: Managed Care, Other (non HMO) | Source: Ambulatory Visit | Attending: Internal Medicine | Admitting: Internal Medicine

## 2011-08-01 ENCOUNTER — Encounter (HOSPITAL_COMMUNITY): Payer: Self-pay | Admitting: Internal Medicine

## 2011-08-01 ENCOUNTER — Encounter (HOSPITAL_COMMUNITY)
Admission: RE | Disposition: A | Discharge status: Home / Self Care | Payer: Self-pay | Source: Ambulatory Visit | Attending: Internal Medicine

## 2011-08-01 DIAGNOSIS — K766 Portal hypertension: Secondary | ICD-10-CM | POA: Insufficient documentation

## 2011-08-01 DIAGNOSIS — K319 Disease of stomach and duodenum, unspecified: Secondary | ICD-10-CM | POA: Insufficient documentation

## 2011-08-01 DIAGNOSIS — I8500 Esophageal varices without bleeding: Secondary | ICD-10-CM

## 2011-08-01 HISTORY — PX: ESOPHAGOGASTRODUODENOSCOPY: SHX5428

## 2011-08-01 HISTORY — DX: Gastro-esophageal reflux disease without esophagitis: K21.9

## 2011-08-01 SURGERY — EGD (ESOPHAGOGASTRODUODENOSCOPY)
Anesthesia: Moderate Sedation

## 2011-08-01 MED ORDER — MIDAZOLAM HCL 10 MG/2ML IJ SOLN
INTRAMUSCULAR | Status: DC | PRN
  Administered 2011-08-01 (×2): 2.5 mg via INTRAVENOUS

## 2011-08-01 MED ORDER — SODIUM CHLORIDE 0.9 % IV SOLN
Freq: Once | INTRAVENOUS | Status: AC
  Administered 2011-08-01: 500 mL via INTRAVENOUS

## 2011-08-01 MED ORDER — FENTANYL CITRATE 0.05 MG/ML IJ SOLN
INTRAMUSCULAR | Status: AC
  Filled 2011-08-01: qty 4

## 2011-08-01 MED ORDER — MIDAZOLAM HCL 10 MG/2ML IJ SOLN
INTRAMUSCULAR | Status: AC
  Filled 2011-08-01: qty 4

## 2011-08-01 MED ORDER — BUTAMBEN-TETRACAINE-BENZOCAINE 2-2-14 % EX AERO
INHALATION_SPRAY | CUTANEOUS | Status: DC | PRN
  Administered 2011-08-01: 2 via TOPICAL

## 2011-08-01 MED ORDER — FENTANYL NICU IV SYRINGE 50 MCG/ML
INJECTION | INTRAMUSCULAR | Status: DC | PRN
  Administered 2011-08-01 (×2): 25 ug via INTRAVENOUS

## 2011-08-01 NOTE — H&P (Signed)
Jonathan Flemings, MD 06/12/2011 11:32 AM Signed  HISTORY OF PRESENT ILLNESS:  Taylor Levick is a 61 y.o. 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 He has been seen at [facility] liver clinic. Complications have included bleeding esophageal varices and mild transient ascites. He was last seen in this office 11/05/2010. See that dictation. He subsequently underwent surveillance endoscopy with band ligation of residual grade 2 varices 12/11/2010. He presents for followup at this time. He has not reestablish with the [facility] liver clinic as requested. He states that since his wife is retired, "I will put her on that job...". I stressed to him the importance of being established without liver clinic. From a GI standpoint, he has done well. He has had no problems with edema or ascites. No evidence for GI bleeding. No issues suggesting encephalopathy. He does state that he has had poor control of diabetes. He sees Dr. Lucianne Muss. Also some blood pressure issues. He sees Dr. Konrad Felix. He needs a refill of his Protonix and propranolol.  REVIEW OF SYSTEMS:  All non-GI ROS negative except for sinus trouble, back pain, muscle cramps, occasional ankle edema, increased thirst, excessive urination.  Past Medical History   Diagnosis  Date   .  Pancytopenia    .  Reflux esophagitis    .  Portal hypertension    .  Stricture and stenosis of esophagus    .  Diverticulosis of colon (without mention of hemorrhage)    .  Internal hemorrhoids without mention of complication    .  Benign neoplasm of colon    .  Duodenitis without mention of hemorrhage    .  Esophageal varices without mention of bleeding    .  Personal history of other diseases of digestive system    .  Personal history of diseases of blood and blood-forming organs    .  Personal history of other diseases of digestive system    .  Cirrhosis of liver  without mention of alcohol    .  Unspecified essential hypertension    .  Obesity, unspecified    .  Calculus of kidney    .  Type II or unspecified type diabetes mellitus without mention of complication, not stated as uncontrolled     History reviewed. No pertinent past surgical history.  Social History  Milford Cilento Boehlke reports that he has never smoked. He has never used smokeless tobacco. He reports that he drinks alcohol. He reports that he does not use illicit drugs.  family history includes Aneurysm in his mother; Diabetes in his father; and Heart disease in his father and other. There is no history of Colon cancer.  No Known Allergies  PHYSICAL EXAMINATION:  Vital signs: BP 148/70  Pulse 52  Ht 5\' 11"  (1.803 m)  Wt 220 lb 3.2 oz (99.882 kg)  BMI 30.71 kg/m2  General: Obese. Well-developed, well-nourished, no acute distress  HEENT: Sclerae are anicteric, conjunctiva pink. Oral mucosa intact. Hypervascular malar region  Lungs: Clear  Heart: Regular  Abdomen: soft, obese, nontender, nondistended, no obvious ascites, no peritoneal signs, normal bowel sounds. No organomegaly.  Extremities: No edema  Psychiatric: alert and oriented x3. Cooperative .  Neuro: No asterixis  ASSESSMENT:  #1. Cryptogenic cirrhosis, likely secondary to NASH. Currently compensated  #2. History of bleeding esophageal varices. Last exam with banding of residual varices June 2012. No evidence of rebleeding  #  3. Known portal gastropathy  #4. GERD  #5. Multiple general medical problems  PLAN:  #1. Refill propranolol 20 mg daily  #2. Repeat upper endoscopy with band ligation of varices, if necessary.The nature of the procedure, as well as the risks, benefits, and alternatives were carefully and thoroughly reviewed with the patient. Ample time for discussion and questions allowed. The patient understood, was satisfied, and agreed to proceed. We were going to set the patient up from a hospital in January. This  was not convenient for him. He will contact the office after the new year to schedule the exam.  We will need to hold his diabetic medications the morning of the procedure to avoid in one to hypoglycemia  #3. Refill pantoprazole  #4. Reestablish with [facility] liver clinic. Emphatically urged to do so  #5. Ongoing general medical care with his endocrinologist and primary provider  GI ATTENDING  PATIENT SEEN AND EXAMINED TODAY. NO INTERVAL CHANGE FROM ABOVE. PLANS EGD W/ POSSIBLE VARICEAL BANDING.The nature of the procedure, as well as the risks, benefits, and alternatives were carefully and thoroughly reviewed with the patient. Ample time for discussion and questions allowed. The patient understood, was satisfied, and agreed to proceed.   Wilhemina Bonito. Eda Keys., M.D. [facility] Division of Gastroenterology

## 2011-08-01 NOTE — Op Note (Signed)
[facility] [address] [place], [place]  [address]  ENDOSCOPY PROCEDURE REPORT  PATIENT:  Jonathan, Burch  MR#:  [id number] BIRTHDATE:  06/20/1951, 60 yrs. old  GENDER:  male  ENDOSCOPIST:  Wilhemina Bonito. Eda Keys, MD Referred by:  Office  PROCEDURE DATE:  08/01/2011 PROCEDURE:  EGD, diagnostic 19147 ASA CLASS:  Class III INDICATIONS:  Known esophageal varices, for surveillance exam. ; last exam 12-2010 w/ band x 2  MEDICATIONS:   Fentanyl 50 mcg IV, Versed 5 mg IV TOPICAL ANESTHETIC:  Cetacaine Spray  DESCRIPTION OF PROCEDURE:   After the risks benefits and alternatives of the procedure were thoroughly explained, informed consent was obtained.  The Pentax Gastroscope [id number] endoscope was introduced through the mouth and advanced to the second portion of the duodenum, without limitations.  The instrument was slowly withdrawn as the mucosa was fully examined. <<PROCEDUREIMAGES>>  Significant scarring from prior banding. One short column of Grade I varix in the distal esophagus. Nothing amenable to band ligation.  Portal Hypertensive gastropathy in the body of the stomach. Several fundic gland polyps.No gastric varices. Otherwise the examination was normal to D2.    Retroflexed views revealed no abnormalities.    The scope was then withdrawn from the patient and the procedure completed.  COMPLICATIONS:  None  ENDOSCOPIC IMPRESSION: 1) Partial Grade I varix in the distal esophagus. Nothing amenable to banding 2) Portal Hypertensive gastropathy in the body of the stomach 3) Otherwise normal examination  RECOMMENDATIONS: 1)  Office follow-upwith Dr Marina Goodell in  6 months. 2)  Repeat endoscopy in 6-12 months 3) PLEASE RE-ESTABLISH CARE WITH DR Katrinka Blazing AT [facility]  ______________________________ Wilhemina Bonito. Eda Keys, MD  CC:  The Patient; Konrad Felix MD; Larey Dresser, MD [facility])  n. eSIGNED:   Wilhemina Bonito. Eda Keys at 08/01/2011 10:47 AM  Albesa Seen, [phone]

## 2011-08-04 ENCOUNTER — Encounter (HOSPITAL_COMMUNITY): Payer: Self-pay | Admitting: Internal Medicine

## 2011-10-21 ENCOUNTER — Encounter: Payer: Self-pay | Admitting: Internal Medicine

## 2012-01-27 ENCOUNTER — Ambulatory Visit: Payer: Managed Care, Other (non HMO) | Admitting: Internal Medicine

## 2012-02-24 ENCOUNTER — Ambulatory Visit (INDEPENDENT_AMBULATORY_CARE_PROVIDER_SITE_OTHER): Payer: Managed Care, Other (non HMO) | Admitting: Internal Medicine

## 2012-02-24 ENCOUNTER — Encounter: Payer: Self-pay | Admitting: Internal Medicine

## 2012-02-24 VITALS — BP 116/68 | HR 64 | Ht 70.0 in | Wt 225.6 lb

## 2012-02-24 DIAGNOSIS — E119 Type 2 diabetes mellitus without complications: Secondary | ICD-10-CM

## 2012-02-24 DIAGNOSIS — Z86010 Personal history of colonic polyps: Secondary | ICD-10-CM

## 2012-02-24 DIAGNOSIS — I8500 Esophageal varices without bleeding: Secondary | ICD-10-CM

## 2012-02-24 DIAGNOSIS — K7460 Unspecified cirrhosis of liver: Secondary | ICD-10-CM

## 2012-02-24 DIAGNOSIS — K7689 Other specified diseases of liver: Secondary | ICD-10-CM

## 2012-02-24 DIAGNOSIS — K219 Gastro-esophageal reflux disease without esophagitis: Secondary | ICD-10-CM

## 2012-02-24 MED ORDER — PANTOPRAZOLE SODIUM 40 MG PO TBEC: 40.0000 mg | DELAYED_RELEASE_TABLET | Freq: Every day | ORAL | Status: DC

## 2012-02-24 MED ORDER — MOVIPREP 100 G PO SOLR: 1.0000 | Freq: Once | ORAL | Status: DC

## 2012-02-24 MED ORDER — PROPRANOLOL HCL 20 MG PO TABS: 20.0000 mg | ORAL_TABLET | Freq: Every day | ORAL | Status: DC

## 2012-02-24 NOTE — Progress Notes (Signed)
HISTORY OF PRESENT ILLNESS:  [name] is a 61 y.o. male with multiple medical problems as outlined. He is followed in this office for hepatic cirrhosis, felt secondary to NASH, with portal hypertension, GERD, and adenomatous colon polyps. He also has a history of esophageal varices for which she has required band ligation therapy. He has been followed intermittently at [facility] the hepatology/transplant clinic by Dr. Larey Dresser. I last saw the patient in January 2013 when he underwent surveillance upper endoscopy. Trivial variceal remnants only and portal hypertensive gastropathy. No variceal band ligation required. Fortunately, he didn't followup on my recommendation to reestablish with the [facility] liver clinic, after a long absence. I have reviewed that encounter dated 11/10/2011. The patient presents today for his routine office followup as requested. Apparently had liver imaging at the time of his recent [facility] visit. Last imaging here, liver ultrasound, was unremarkable and April 2012. Since his last visit, he reports doing well. Nothing to suggest problems with encephalopathy, ascites, or edema. No GI bleeding. He reports that his other medical problems are stable. He does request refill of his GI medications (pantoprazole and propranolol). No active GERD symptoms on PPI. He remains active and states he works proximally 65 hours per week. In terms of colonoscopy, he did recently received a recall letter. His index exam was performed in 2004. A lesion with high grade dysplasia removed. Followup exams occurred in 2006 and again in 2008 with adenomatous polyps. Due for followup at this time. He denies lower GI complaints.  REVIEW OF SYSTEMS:  All non-GI ROS negative except for occasional fatigue.  Past Medical History  Diagnosis Date  . Pancytopenia   . Reflux esophagitis   . Portal hypertension   . Stricture and stenosis of esophagus   . Diverticulosis of colon  (without mention of hemorrhage)   . Internal hemorrhoids without mention of complication   . Benign neoplasm of colon   . Duodenitis without mention of hemorrhage   . 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 Personal history of other diseases of digestive system   . Personal history of diseases of blood and blood-forming organs   . Personal history of other diseases of digestive system   . Cirrhosis of liver without mention of alcohol   . Unspecified essential hypertension   . Obesity, unspecified   . Calculus of kidney   . Type II or unspecified type diabetes mellitus without mention of complication, not stated as uncontrolled   . Shortness of breath     exertion  . GERD (gastroesophageal reflux disease)   . Hemorrhoids   . Colon polyps     Past Surgical History  Procedure Date  . Kidney stone surgery   . Esophagogastroduodenoscopy 08/01/2011    Procedure: ESOPHAGOGASTRODUODENOSCOPY (EGD);  Surgeon: Yancey Flemings, MD;  Location: [facility];  Service: Endoscopy;  Laterality: N/A;    Social History [name]  reports that he has never smoked. He has never used smokeless tobacco. He reports that he does not drink alcohol or use illicit drugs.  family history includes Aneurysm in his mother; Diabetes in his father; and Heart disease in his father and other.  There is no history of Colon cancer, and Anesthesia problems, and Hypotension, and Malignant hyperthermia, and Pseudochol deficiency, .  No Known Allergies     PHYSICAL EXAMINATION: Vital signs: BP 116/68  Pulse 64  Ht 5\' 10"  (1.778 m)  Wt 225 lb 9.6 oz (102.331 kg)  BMI 32.37 kg/m2  Constitutional: obese,generally well-appearing, no acute distress Psychiatric: alert and oriented x3, cooperative Eyes: extraocular movements intact, anicteric, conjunctiva pink Mouth: oral pharynx moist, no lesions Neck: supple no lymphadenopathy Cardiovascular: heart regular rate and rhythm, no murmur Lungs: clear to  auscultation bilaterally Abdomen: soft,obese, nontender, nondistended, no obvious ascites, no peritoneal signs, normal bowel sounds, no organomegaly Rectal:deferred until colonoscopy Extremities: no lower extremity edema bilaterally Skin: no lesions on visible extremities Neuro: No focal deficits. No asterixis.    ASSESSMENT:  #1. Hepatic cirrhosis, presumably secondary NASH, with portal hypertension. Compensated. Follow here and at the [facility] liver/transplant clinic. On propranolol #2. GERD. Asymptomatic on PPI. #3. History of esophageal varices requiring band ligation. Last upper endoscopy January 2013 without the need for band ligation #4. History of adenomatous colon polyps. Due for surveillance. #5. Diabetes mellitus on oral agents   PLAN:  #1. Continue propranolol. Refill #2. Continue pantoprazole. Refill #3. Surveillance colonoscopy at this time. Appropriate candidate without contraindication.The nature of the procedure, as well as the risks, benefits, and alternatives were carefully and thoroughly reviewed with the patient. Ample time for discussion and questions allowed. The patient understood, was satisfied, and agreed to proceed. Movi prep prescribed. The patient instructed on its use. Hold oral diabetic agents the day of the examination. Monitor blood sugar immediately before and after the examination #4. Surveillance upper endoscopy around January 2014 #5. Keep plans followup appointment with the [facility] liver clinic, around December 2013

## 2012-02-24 NOTE — Patient Instructions (Addendum)
You have been scheduled for a colonoscopy with propofol. Please follow written instructions given to you at your visit today.  Please pick up your prep kit at the pharmacy within the next 1-3 days. If you use inhalers (even only as needed), please bring them with you on the day of your procedure.  We have sent the following medications to your pharmacy for you to pick up at your convenience: Protonix and Propanolol

## 2012-03-31 ENCOUNTER — Ambulatory Visit (AMBULATORY_SURGERY_CENTER): Payer: Managed Care, Other (non HMO) | Admitting: Internal Medicine

## 2012-03-31 ENCOUNTER — Encounter: Payer: Self-pay | Admitting: Internal Medicine

## 2012-03-31 VITALS — BP 124/76 | HR 59 | Temp 97.8°F | Resp 16 | Ht 70.0 in | Wt 225.0 lb

## 2012-03-31 DIAGNOSIS — Z86010 Personal history of colonic polyps: Secondary | ICD-10-CM

## 2012-03-31 DIAGNOSIS — Z1211 Encounter for screening for malignant neoplasm of colon: Secondary | ICD-10-CM

## 2012-03-31 DIAGNOSIS — D126 Benign neoplasm of colon, unspecified: Secondary | ICD-10-CM

## 2012-03-31 DIAGNOSIS — K7460 Unspecified cirrhosis of liver: Secondary | ICD-10-CM

## 2012-03-31 LAB — GLUCOSE, CAPILLARY: Glucose-Capillary: 102 mg/dL — ABNORMAL HIGH (ref 70–99)

## 2012-03-31 MED ORDER — SODIUM CHLORIDE 0.9 % IV SOLN: 500.0000 mL | INTRAVENOUS | Status: DC

## 2012-03-31 NOTE — Progress Notes (Signed)
Changed size of bp cuff from large to regular cuff . Orange juice taken by patient.

## 2012-03-31 NOTE — Op Note (Signed)
[facility] [address] [place], [address]   COLONOSCOPY PROCEDURE REPORT  PATIENT: Jonathan, Burch  MR#: [id number] BIRTHDATE: 1950/12/14 , 61  yrs. old GENDER: Male ENDOSCOPIST: Roxy Cedar, MD REFERRED AO:[id number] Program Recall PROCEDURE DATE:  03/31/2012 PROCEDURE:   Colonoscopy with snare polypectomy    x 1 ASA CLASS:   Class III INDICATIONS:patient's personal history of adenomatous colon polyps (2004 - HGD; 2952,8413 - TAs). MEDICATIONS: MAC sedation, administered by CRNA and propofol (Diprivan) 230mg  IV  DESCRIPTION OF PROCEDURE:   After the risks benefits and alternatives of the procedure were thoroughly explained, informed consent was obtained.  A digital rectal exam revealed no abnormalities of the rectum.   The LB CF-[id number]  endoscope was introduced through the anus and advanced to the cecum, which was identified by both the appendix and ileocecal valve. No adverse events experienced.   The quality of the prep was excellent, using MoviPrep  The instrument was then slowly withdrawn as the colon was fully examined.      COLON FINDINGS: A pedunculated polyp measuring 1.2 cm in size was found in the ascending colon.  A polypectomy was performed using snare cautery.  The resection was complete and the polyp tissue was completely retrieved.   Portal hypertensive colopathy was present. The colon mucosa was otherwise normal.  Retroflexed views revealed internal hemorrhoids. The time to cecum=2 minutes 28 seconds. Withdrawal time=15 minutes 15 seconds.  The scope was withdrawn and the procedure completed. COMPLICATIONS: There were no complications.  ENDOSCOPIC IMPRESSION: 1.   Pedunculated polyp measuring 1.2 cm in size was found in the ascending colon; polypectomy was performed using snare cautery 2.   Portal hypertensive colopathy. 3.   The colon mucosa was otherwise normal  RECOMMENDATIONS: 1. repeat Colonoscopy in 3  years.   eSigned:  Roxy Cedar, MD 03/31/2012 2:45 PM   cc: Robb Matar MD and The Patient   PATIENT NAME:  Jonathan, Burch MR#: [id number]

## 2012-03-31 NOTE — Patient Instructions (Addendum)
YOU HAD AN ENDOSCOPIC PROCEDURE TODAY AT THE [facility]: Refer to the procedure report that was given to you for any specific questions about what was found during the examination.  If the procedure report does not answer your questions, please call your gastroenterologist to clarify.  If you requested that your care partner not be given the details of your procedure findings, then the procedure report has been included in a sealed envelope for you to review at your convenience later.  YOU SHOULD EXPECT: Some feelings of bloating in the abdomen. Passage of more gas than usual.  Walking can help get rid of the air that was put into your GI tract during the procedure and reduce the bloating. If you had a lower endoscopy (such as a colonoscopy or flexible sigmoidoscopy) you may notice spotting of blood in your stool or on the toilet paper. If you underwent a bowel prep for your procedure, then you may not have a normal bowel movement for a few days.  DIET: Your first meal following the procedure should be a light meal and then it is ok to progress to your normal diet.  A half-sandwich or bowl of soup is an example of a good first meal.  Heavy or fried foods are harder to digest and may make you feel nauseous or bloated.  Likewise meals heavy in dairy and vegetables can cause extra gas to form and this can also increase the bloating.  Drink plenty of fluids but you should avoid alcoholic beverages for 24 hours.  ACTIVITY: Your care partner should take you home directly after the procedure.  You should plan to take it easy, moving slowly for the rest of the day.  You can resume normal activity the day after the procedure however you should NOT DRIVE or use heavy machinery for 24 hours (because of the sedation medicines used during the test).    SYMPTOMS TO REPORT IMMEDIATELY: A gastroenterologist can be reached at any hour.  During normal business hours, 8:30 AM to 5:00 PM Monday through Friday,  call [phone].  After hours and on weekends, please call the GI answering service at [phone] who will take a message and have the physician on call contact you.  FOLLOW UP: If any biopsies were taken you will be contacted by phone or by letter within the next 1-3 weeks.  Call your gastroenterologist if you have not heard about the biopsies in 3 weeks.  Our staff will call the home number listed on your records the next business day following your procedure to check on you and address any questions or concerns that you may have at that time regarding the information given to you following your procedure. This is a courtesy call and so if there is no answer at the home number and we have not heard from you through the emergency physician on call, we will assume that you have returned to your regular daily activities without incident.  SIGNATURES/CONFIDENTIALITY: You and/or your care partner have signed paperwork which will be entered into your electronic medical record.  These signatures attest to the fact that that the information above on your After Visit Summary has been reviewed and is understood.  Full responsibility of the confidentiality of this discharge information lies with you and/or your care-partner.

## 2012-03-31 NOTE — Progress Notes (Signed)
Orange juice,graham crackers and peanut butter given to patient. Patient to restroom prior to dc.

## 2012-03-31 NOTE — Progress Notes (Signed)
Patient did not experience any of the following events: a burn prior to discharge; a fall within the facility; wrong site/side/patient/procedure/implant event; or a hospital transfer or hospital admission upon discharge from the facility. (G8907) Patient did not have preoperative order for IV antibiotic SSI prophylaxis. (G8918)  

## 2012-04-01 ENCOUNTER — Telehealth: Payer: Self-pay | Admitting: *Deleted

## 2012-04-01 NOTE — Telephone Encounter (Signed)
  Follow up Call-  Call back number 03/31/2012  Post procedure Call Back phone  # [phone]  Permission to leave phone message Yes     Patient questions:  Do you have a fever, pain , or abdominal swelling? no Pain Score  0 *  Have you tolerated food without any problems? yes  Have you been able to return to your normal activities? yes  Do you have any questions about your discharge instructions: Diet   no Medications  no Follow up visit  no  Do you have questions or concerns about your Care? no  Actions: * If pain score is 4 or above: No action needed, pain <4.  Spoke with pts wife

## 2012-04-06 ENCOUNTER — Encounter: Payer: Self-pay | Admitting: Internal Medicine

## 2012-07-12 DIAGNOSIS — K7460 Unspecified cirrhosis of liver: Secondary | ICD-10-CM | POA: Insufficient documentation

## 2012-07-12 DIAGNOSIS — E669 Obesity, unspecified: Secondary | ICD-10-CM | POA: Insufficient documentation

## 2012-07-12 DIAGNOSIS — E119 Type 2 diabetes mellitus without complications: Secondary | ICD-10-CM | POA: Insufficient documentation

## 2012-07-12 DIAGNOSIS — K766 Portal hypertension: Secondary | ICD-10-CM | POA: Insufficient documentation

## 2012-09-08 ENCOUNTER — Encounter: Payer: Self-pay | Admitting: Internal Medicine

## 2012-10-01 ENCOUNTER — Other Ambulatory Visit: Payer: Self-pay | Admitting: Internal Medicine

## 2012-10-01 ENCOUNTER — Telehealth: Payer: Self-pay

## 2012-10-01 DIAGNOSIS — I8500 Esophageal varices without bleeding: Secondary | ICD-10-CM

## 2012-10-01 NOTE — Telephone Encounter (Signed)
Message copied by Chrystie Nose on Fri Oct 01, 2012  1:03 PM ------      Message from: Hilarie Fredrickson      Created: Tue Sep 14, 2012  4:43 PM      Regarding: RE: Hospital EGD       My April or May hospital week would be better. Thanks      ----- Message -----         From: Lily Lovings, RN         Sent: 09/14/2012   1:19 PM           To: Hilarie Fredrickson, MD      Subject: Hospital EGD                                             Dr. Marina Goodell,            I have a hospital recall for an egd on this pt, follow-up to one done in Jan of 2013. Do you just want him scheduled at [facility] during your next hospital week?            Thanks,      Bonita Quin        ------

## 2012-10-01 NOTE — Telephone Encounter (Signed)
Pt scheduled for EGD with banding at [facility] 11/22/12@12noon . Left message for pt to call back regarding appt. Pt called back and he is aware of appt date and time. Prep instructions mailed to pt.

## 2012-10-01 NOTE — Telephone Encounter (Signed)
ok 

## 2012-10-01 NOTE — Telephone Encounter (Signed)
Dr. Marina Goodell I was working on scheduling this EGD on May 19 at [facility]. Propofol is only available after 12 noon that day and you already have 2 procedures scheduled on Tuesday. Is it ok to schedule the pt for that Monday afternoon during your hospital week?

## 2012-10-01 NOTE — Telephone Encounter (Signed)
Pt states he would like the EGD done after May 18th due to his job.

## 2012-10-27 ENCOUNTER — Telehealth: Payer: Self-pay | Admitting: Internal Medicine

## 2012-10-27 ENCOUNTER — Encounter (HOSPITAL_COMMUNITY): Payer: Self-pay | Admitting: *Deleted

## 2012-10-27 ENCOUNTER — Inpatient Hospital Stay (HOSPITAL_COMMUNITY)
Admission: EM | Admit: 2012-10-27 | Discharge: 2012-10-29 | DRG: 441 | Disposition: A | Discharge status: Home / Self Care | Payer: Managed Care, Other (non HMO) | Attending: Internal Medicine | Admitting: Internal Medicine

## 2012-10-27 ENCOUNTER — Encounter (HOSPITAL_COMMUNITY)
Admission: EM | Disposition: A | Discharge status: Home / Self Care | Payer: Self-pay | Source: Home / Self Care | Attending: Internal Medicine

## 2012-10-27 DIAGNOSIS — I8511 Secondary esophageal varices with bleeding: Secondary | ICD-10-CM | POA: Diagnosis present

## 2012-10-27 DIAGNOSIS — Z8719 Personal history of other diseases of the digestive system: Secondary | ICD-10-CM

## 2012-10-27 DIAGNOSIS — Z79899 Other long term (current) drug therapy: Secondary | ICD-10-CM

## 2012-10-27 DIAGNOSIS — Z6831 Body mass index (BMI) 31.0-31.9, adult: Secondary | ICD-10-CM

## 2012-10-27 DIAGNOSIS — K7689 Other specified diseases of liver: Secondary | ICD-10-CM

## 2012-10-27 DIAGNOSIS — I10 Essential (primary) hypertension: Secondary | ICD-10-CM | POA: Diagnosis present

## 2012-10-27 DIAGNOSIS — D126 Benign neoplasm of colon, unspecified: Secondary | ICD-10-CM

## 2012-10-27 DIAGNOSIS — K209 Esophagitis, unspecified without bleeding: Secondary | ICD-10-CM | POA: Diagnosis present

## 2012-10-27 DIAGNOSIS — Z862 Personal history of diseases of the blood and blood-forming organs and certain disorders involving the immune mechanism: Secondary | ICD-10-CM

## 2012-10-27 DIAGNOSIS — E119 Type 2 diabetes mellitus without complications: Secondary | ICD-10-CM | POA: Diagnosis present

## 2012-10-27 DIAGNOSIS — I8501 Esophageal varices with bleeding: Secondary | ICD-10-CM

## 2012-10-27 DIAGNOSIS — D62 Acute posthemorrhagic anemia: Secondary | ICD-10-CM | POA: Diagnosis present

## 2012-10-27 DIAGNOSIS — N200 Calculus of kidney: Secondary | ICD-10-CM

## 2012-10-27 DIAGNOSIS — Z860100 Personal history of colon polyps, unspecified: Secondary | ICD-10-CM

## 2012-10-27 DIAGNOSIS — K7460 Unspecified cirrhosis of liver: Secondary | ICD-10-CM | POA: Diagnosis present

## 2012-10-27 DIAGNOSIS — K219 Gastro-esophageal reflux disease without esophagitis: Secondary | ICD-10-CM

## 2012-10-27 DIAGNOSIS — K766 Portal hypertension: Principal | ICD-10-CM

## 2012-10-27 DIAGNOSIS — I8500 Esophageal varices without bleeding: Secondary | ICD-10-CM

## 2012-10-27 DIAGNOSIS — K922 Gastrointestinal hemorrhage, unspecified: Secondary | ICD-10-CM

## 2012-10-27 DIAGNOSIS — R1084 Generalized abdominal pain: Secondary | ICD-10-CM

## 2012-10-27 DIAGNOSIS — K2980 Duodenitis without bleeding: Secondary | ICD-10-CM

## 2012-10-27 DIAGNOSIS — E669 Obesity, unspecified: Secondary | ICD-10-CM | POA: Diagnosis present

## 2012-10-27 DIAGNOSIS — Z87442 Personal history of urinary calculi: Secondary | ICD-10-CM

## 2012-10-27 DIAGNOSIS — K648 Other hemorrhoids: Secondary | ICD-10-CM

## 2012-10-27 DIAGNOSIS — K222 Esophageal obstruction: Secondary | ICD-10-CM

## 2012-10-27 DIAGNOSIS — E869 Volume depletion, unspecified: Secondary | ICD-10-CM | POA: Diagnosis present

## 2012-10-27 DIAGNOSIS — K5900 Constipation, unspecified: Secondary | ICD-10-CM

## 2012-10-27 DIAGNOSIS — R188 Other ascites: Secondary | ICD-10-CM

## 2012-10-27 DIAGNOSIS — K5730 Diverticulosis of large intestine without perforation or abscess without bleeding: Secondary | ICD-10-CM

## 2012-10-27 DIAGNOSIS — Z794 Long term (current) use of insulin: Secondary | ICD-10-CM

## 2012-10-27 HISTORY — PX: ESOPHAGOGASTRODUODENOSCOPY: SHX5428

## 2012-10-27 LAB — COMPREHENSIVE METABOLIC PANEL
ALT: 27 U/L (ref 0–53)
AST: 26 U/L (ref 0–37)
CO2: 24 mEq/L (ref 19–32)
Chloride: 102 mEq/L (ref 96–112)
GFR calc non Af Amer: >90 mL/min (ref >90)
Glucose, Bld: 206 mg/dL — ABNORMAL HIGH (ref 70–99)
Sodium: 137 mEq/L (ref 135–145)
Total Bilirubin: 1.1 mg/dL (ref 0.3–1.2)

## 2012-10-27 LAB — PROTIME-INR: INR: 1.37 (ref 0.00–1.49)

## 2012-10-27 LAB — CBC
Hemoglobin: 10.7 g/dL — ABNORMAL LOW (ref 13.0–17.0)
MCV: 83.4 fL (ref 78.0–100.0)
Platelets: 88 10*3/uL — ABNORMAL LOW (ref 150–400)
RBC: 3.67 MIL/uL — ABNORMAL LOW (ref 4.22–5.81)
WBC: 9.7 10*3/uL (ref 4.0–10.5)

## 2012-10-27 LAB — HEMOGLOBIN AND HEMATOCRIT, BLOOD: HCT: 26.2 % — ABNORMAL LOW (ref 39.0–52.0)

## 2012-10-27 LAB — APTT: aPTT: 34 seconds (ref 24–37)

## 2012-10-27 LAB — OCCULT BLOOD, POC DEVICE: Fecal Occult Bld: POSITIVE — AB

## 2012-10-27 SURGERY — EGD (ESOPHAGOGASTRODUODENOSCOPY)
Anesthesia: Moderate Sedation

## 2012-10-27 MED ORDER — FENTANYL CITRATE 0.05 MG/ML IJ SOLN
INTRAMUSCULAR | Status: DC | PRN
  Administered 2012-10-27 (×2): 25 ug via INTRAVENOUS

## 2012-10-27 MED ORDER — OCTREOTIDE LOAD VIA INFUSION
50.0000 ug | Freq: Once | INTRAVENOUS | Status: AC
  Administered 2012-10-27: 50 ug via INTRAVENOUS
  Filled 2012-10-27: qty 25

## 2012-10-27 MED ORDER — ACETAMINOPHEN 650 MG RE SUPP: 650.0000 mg | Freq: Four times a day (QID) | RECTAL | Status: DC | PRN

## 2012-10-27 MED ORDER — ONDANSETRON HCL 4 MG/2ML IJ SOLN: 4.0000 mg | Freq: Once | INTRAMUSCULAR | Status: AC

## 2012-10-27 MED ORDER — FENTANYL CITRATE 0.05 MG/ML IJ SOLN
INTRAMUSCULAR | Status: AC
  Filled 2012-10-27: qty 4

## 2012-10-27 MED ORDER — ACETAMINOPHEN 325 MG PO TABS
650.0000 mg | ORAL_TABLET | Freq: Four times a day (QID) | ORAL | Status: DC | PRN
  Administered 2012-10-28: 650 mg via ORAL
  Filled 2012-10-27 (×2): qty 2

## 2012-10-27 MED ORDER — ONDANSETRON HCL 4 MG/2ML IJ SOLN: 4.0000 mg | Freq: Four times a day (QID) | INTRAMUSCULAR | Status: DC | PRN

## 2012-10-27 MED ORDER — PANTOPRAZOLE SODIUM 40 MG IV SOLR
40.0000 mg | Freq: Two times a day (BID) | INTRAVENOUS | Status: DC
  Administered 2012-10-27 – 2012-10-28 (×2): 40 mg via INTRAVENOUS
  Filled 2012-10-27 (×3): qty 40

## 2012-10-27 MED ORDER — SODIUM CHLORIDE 0.9 % IV SOLN
INTRAVENOUS | Status: DC
  Administered 2012-10-28: 04:00:00 via INTRAVENOUS

## 2012-10-27 MED ORDER — INSULIN ASPART 100 UNIT/ML ~~LOC~~ SOLN
0.0000 [IU] | Freq: Three times a day (TID) | SUBCUTANEOUS | Status: DC
  Administered 2012-10-28 (×2): 3 [IU] via SUBCUTANEOUS
  Administered 2012-10-28: 1 [IU] via SUBCUTANEOUS
  Administered 2012-10-29: 3 [IU] via SUBCUTANEOUS
  Administered 2012-10-29: 1 [IU] via SUBCUTANEOUS

## 2012-10-27 MED ORDER — MIDAZOLAM HCL 10 MG/2ML IJ SOLN
INTRAMUSCULAR | Status: DC | PRN
  Administered 2012-10-27 (×2): 2.5 mg via INTRAVENOUS

## 2012-10-27 MED ORDER — ONDANSETRON HCL 4 MG PO TABS: 4.0000 mg | ORAL_TABLET | Freq: Four times a day (QID) | ORAL | Status: DC | PRN

## 2012-10-27 MED ORDER — SODIUM CHLORIDE 0.9 % IV BOLUS (SEPSIS)
1000.0000 mL | Freq: Once | INTRAVENOUS | Status: AC
  Administered 2012-10-27: 1000 mL via INTRAVENOUS

## 2012-10-27 MED ORDER — SODIUM CHLORIDE 0.9 % IV SOLN
INTRAVENOUS | Status: DC
  Administered 2012-10-27: 13:00:00 via INTRAVENOUS

## 2012-10-27 MED ORDER — SODIUM CHLORIDE 0.9 % IJ SOLN
3.0000 mL | Freq: Two times a day (BID) | INTRAMUSCULAR | Status: DC
  Administered 2012-10-28 – 2012-10-29 (×2): 3 mL via INTRAVENOUS

## 2012-10-27 MED ORDER — MIDAZOLAM HCL 10 MG/2ML IJ SOLN
INTRAMUSCULAR | Status: AC
  Filled 2012-10-27: qty 4

## 2012-10-27 MED ORDER — PANTOPRAZOLE SODIUM 40 MG IV SOLR
40.0000 mg | Freq: Once | INTRAVENOUS | Status: AC
  Administered 2012-10-27: 40 mg via INTRAVENOUS
  Filled 2012-10-27: qty 40

## 2012-10-27 MED ORDER — PROPRANOLOL HCL 20 MG PO TABS
20.0000 mg | ORAL_TABLET | Freq: Every day | ORAL | Status: DC
  Administered 2012-10-28 – 2012-10-29 (×2): 20 mg via ORAL
  Filled 2012-10-27 (×2): qty 1

## 2012-10-27 MED ORDER — ONDANSETRON HCL 4 MG/2ML IJ SOLN
INTRAMUSCULAR | Status: AC
  Administered 2012-10-27: 4 mg via INTRAVENOUS
  Filled 2012-10-27: qty 2

## 2012-10-27 MED ORDER — DEXTROSE 5 % IV SOLN
1.0000 g | INTRAVENOUS | Status: AC
  Administered 2012-10-27 – 2012-10-28 (×2): 1 g via INTRAVENOUS
  Filled 2012-10-27 (×2): qty 10

## 2012-10-27 MED ORDER — INSULIN GLARGINE 100 UNIT/ML ~~LOC~~ SOLN
18.0000 [IU] | Freq: Two times a day (BID) | SUBCUTANEOUS | Status: DC
  Administered 2012-10-28 – 2012-10-29 (×3): 18 [IU] via SUBCUTANEOUS
  Filled 2012-10-27 (×4): qty 0.18

## 2012-10-27 MED ORDER — SODIUM CHLORIDE 0.9 % IV SOLN
12.5000 ug/h | INTRAVENOUS | Status: DC
  Administered 2012-10-27: 25 ug/h via INTRAVENOUS
  Administered 2012-10-28: 40 ug/h via INTRAVENOUS
  Administered 2012-10-28: 12.5 ug/h via INTRAVENOUS
  Filled 2012-10-27 (×4): qty 1

## 2012-10-27 MED ORDER — BUTAMBEN-TETRACAINE-BENZOCAINE 2-2-14 % EX AERO
INHALATION_SPRAY | CUTANEOUS | Status: DC | PRN
  Administered 2012-10-27: 2 via TOPICAL

## 2012-10-27 NOTE — H&P (Signed)
[facility] History and Physical  [name] VHQ:[id number] DOB: [birth year] DOA: 10/27/2012  Referring physician:  PCP: Robb Matar, MD  Specialists:   Chief Complaint: coffee ground emesis  HPI: [name] is a 62 y.o. 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 He states that last PM while at work he started to feel sick - hot sweaty and began to feel faint subsequently so he went and sat down for sometime. When he felt better he decided to drive home and he vomited on his way home. The vomitus was coffee grounds per his description. He admits to melena since last pm with mild diffuse abd pain. He reports he felt very weak this am ans he came to the ED.He denies fever, dysuria, cough, and no chest pain. He was seen in the ED and his Hgb was 10.7- no recent hgb per epic, was 10 in 2009. GI was consulted per EDP and plan is for him to go for EGD today, and hospitalist asked to admit pt for further eval and management.   Review of Systems: The patient denies anorexia, fever, weight loss,, vision loss, decreased hearing, hoarseness, chest pain, syncope, dyspnea on exertion, peripheral edema, balance deficits, hemoptysis, severe indigestion/heartburn, hematuria, incontinencesuspicious skin lesions, transient blindness, difficulty walking, depression, unusual weight change, abnormal bleeding, enlarged lymph nodes, angioedema, and breast masses.    Past Medical History  Diagnosis Date  . Pancytopenia   . Reflux esophagitis   . Portal hypertension   . Stricture and stenosis of esophagus   . Diverticulosis of colon (without mention of hemorrhage)   . Internal hemorrhoids without mention of complication   . Benign neoplasm of colon   . Duodenitis without mention of hemorrhage   . 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 Personal history of diseases of  blood and blood-forming organs   . Cirrhosis of liver without mention of alcohol   . Unspecified essential hypertension   . Obesity, unspecified   . Calculus of kidney   . Type II or unspecified type diabetes mellitus without mention of complication, not stated as uncontrolled   . GERD (gastroesophageal reflux disease)   . Hemorrhoids   . Colon polyps    Past Surgical History  Procedure Laterality Date  . Kidney stone surgery    . Esophagogastroduodenoscopy  08/01/2011    Procedure: ESOPHAGOGASTRODUODENOSCOPY (EGD);  Surgeon: Yancey Flemings, MD;  Location: [facility];  Service: Endoscopy;  Laterality: N/A;   Social History:  reports that he has never smoked. He has never used smokeless tobacco. He reports that he does not drink alcohol or use illicit drugs. where does patient live--home Can patient participate in ADLs-yes  No Known Allergies  Family History  Problem Relation Age of Onset  . Colon cancer Neg Hx   . Anesthesia problems Neg Hx   . Hypotension Neg Hx   . Malignant hyperthermia Neg Hx   . Pseudochol deficiency Neg Hx   . Diabetes Father   . Heart disease Father   . Aneurysm Mother   . Heart disease Other     neice   Prior to Admission medications   Medication Sig Start Date End Date Taking? Authorizing Provider  glimepiride (AMARYL) 2 MG tablet Take 2 mg by mouth 2 (two) times daily.     Yes Historical Provider, MD  insulin aspart (NOVOLOG) 100 UNIT/ML injection Inject 8  Units into the skin 2 (two) times daily.   Yes Historical Provider, MD  insulin glargine (LANTUS) 100 UNIT/ML injection Inject 18 Units into the skin 2 (two) times daily.   Yes Historical Provider, MD  Multiple Minerals-Vitamins (CALCIUM-MAGNESIUM-ZINC) TABS Take 1 tablet by mouth daily.   Yes Historical Provider, MD  pantoprazole (PROTONIX) 40 MG tablet Take 1 tablet (40 mg total) by mouth daily. 02/24/12  Yes Hilarie Fredrickson, MD  potassium chloride SA (K-DUR,KLOR-CON) 20 MEQ tablet Take 20 mEq by  mouth daily.   Yes Historical Provider, MD  propranolol (INDERAL) 20 MG tablet Take 1 tablet (20 mg total) by mouth daily. 02/24/12  Yes Hilarie Fredrickson, MD  sitaGLIPtan-metformin (JANUMET) 50-1000 MG per tablet Take 1 tablet by mouth 2 (two) times daily with a meal.     Yes Historical Provider, MD   Physical Exam: Filed Vitals:   10/27/12 1219  BP: 135/86  Pulse: 86  Temp: 99 F (37.2 C)  TempSrc: Oral  SpO2: 97%    Constitutional: Vital signs reviewed.  Patient is a well-developed and well-nourished in no acute distress and cooperative with exam. Alert and oriented x3.  Head: Normocephalic and atraumatic Mouth: no erythema or exudates,slightly dry MMM Eyes: PERRL, EOMI, conjunctivae normal, No scleral icterus.  Neck: Supple, Trachea midline normal ROM, No JVD, mass, thyromegaly, or carotid bruit present.  Cardiovascular: RRR, S1 normal, S2 normal, no MRG, pulses symmetric and intact bilaterally Pulmonary/Chest: CTAB, no wheezes, rales, or rhonchi Abdominal: Soft. Non-tender, non-distended, bowel sounds are normal, no masses, organomegaly, or guarding present.  GU: no CVA tenderness Extremities: No cyanosis ad no edema Neurological: A&O x3, Strength is normal and symmetric bilaterally, cranial nerve II-XII are grossly intact, no focal motor deficit, sensory intact to light touch bilaterally.  Skin: Warm, dry and intact. No rash.  Psychiatric: Normal mood and affect. speech and behavior is normal. Judgment and thought content normal.  Labs on Admission:  Basic Metabolic Panel:  Recent Labs Lab 10/27/12 1145  NA 137  K 4.4  CL 102  CO2 24  GLUCOSE 206*  BUN 32*  CREATININE 0.56  CALCIUM 9.8   Liver Function Tests:  Recent Labs Lab 10/27/12 1145  AST 26  ALT 27  ALKPHOS 45  BILITOT 1.1  PROT 6.4  ALBUMIN 3.1*   No results found for this basename: LIPASE, AMYLASE,  in the last 168 hours  Recent Labs Lab 10/27/12 1145  AMMONIA 131*   CBC:  Recent Labs Lab  10/27/12 1145  WBC 9.7  HGB 10.7*  HCT 30.6*  MCV 83.4  PLT 88*   Cardiac Enzymes: No results found for this basename: CKTOTAL, CKMB, CKMBINDEX, TROPONINI,  in the last 168 hours  BNP (last 3 results) No results found for this basename: PROBNP,  in the last 8760 hours CBG: No results found for this basename: GLUCAP,  in the last 168 hours  Radiological Exams on Admission: No results found.  Assessment/Plan Active Problems:  UGIB (upper gastrointestinal bleed) -As discussed above, possible variceal in this pt with known varices in setting of portal HTN, cirrhosis. -continue sandostatin as per GI, place on PPI -EGD per GI today -serial HHs, follow and tranfuse as appropriate   Anemia due to acute blood loss -secondary to above, cycle HH, and transfuse as above   Volume depletion -hydrate with IVF and follow DM -monitor accuchecks and cover with SSI, follow and resume lantus in am and PO mes when appropriate   HYPERTENSION -hold  BP meds secondary to #1, monitor and treat as appropriate   ESOPHAGITIS, REFLUX -PPI as above   CIRRHOSIS/Portal hypertension -per GI    Code Status: full Family Communication: Family at bedside Disposition Plan:   Time spent: >24mins  Kela Millin [facility] Pager [phone]  If 7PM-7AM, please contact night-coverage [email or web address] Password [facility] 10/27/2012, 2:30 PM

## 2012-10-27 NOTE — ED Provider Notes (Signed)
History     CSN: [id number]  Arrival date & time 10/27/12  1110   First MD Initiated Contact with Patient 10/27/12 1131      Chief Complaint  Patient presents with  . GI Bleeding  . Hematemesis  . Rectal Bleeding    (Consider location/radiation/quality/duration/timing/severity/associated sxs/prior treatment) HPI Comments: [name] is a 62 y.o. male w a hx of DM, pancytopenia, NASH, esophageal varices, & portal HTN presents to the ER c/o coffee ground emesis and melena. Episode of emesis occurred at 1:30 AM described as "crumbled Oreo cookies", occurred just the once, associated with increased bruising, dizziness and fatigue. Last night pt reports dark stools and chills. 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 Followed by Dr. Marina Goodell at [facility]. Last endoscopy was about 1 year ago, appointment is scheduled for next month. Pt denies any bright red blood, abdominal pain, fevers, weight loss, recent change in mental status, recent falls, chest pain or SOB.   GI- Dr Guinevere Ferrari for portal HTN and NASH, last seen 07/13/2012. F-u in 8 mo PCP- Dr. Barnie Alderman  The history is provided by the patient.    Past Medical History  Diagnosis Date  . Pancytopenia   . Reflux esophagitis   . Portal hypertension   . Stricture and stenosis of esophagus   . Diverticulosis of colon (without mention of hemorrhage)   . Internal hemorrhoids without mention of complication   . Benign neoplasm of colon   . Duodenitis without mention of hemorrhage   . 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 Personal history of other diseases of digestive system   . Personal history of diseases of blood and blood-forming organs   . Personal history of other diseases of digestive system   . Cirrhosis of liver without mention of alcohol   . Unspecified essential hypertension   . Obesity, unspecified   . Calculus of kidney   . Type II or unspecified type diabetes mellitus without  mention of complication, not stated as uncontrolled   . Shortness of breath     exertion  . GERD (gastroesophageal reflux disease)   . Hemorrhoids   . Colon polyps     Past Surgical History  Procedure Laterality Date  . Kidney stone surgery    . Esophagogastroduodenoscopy  08/01/2011    Procedure: ESOPHAGOGASTRODUODENOSCOPY (EGD);  Surgeon: Yancey Flemings, MD;  Location: [facility];  Service: Endoscopy;  Laterality: N/A;    Family History  Problem Relation Age of Onset  . Colon cancer Neg Hx   . Anesthesia problems Neg Hx   . Hypotension Neg Hx   . Malignant hyperthermia Neg Hx   . Pseudochol deficiency Neg Hx   . Diabetes Father   . Heart disease Father   . Aneurysm Mother   . Heart disease Other     neice    History  Substance Use Topics  . Smoking status: Never Smoker   . Smokeless tobacco: Never Used  . Alcohol Use: No     Comment: rare      Review of Systems  All other systems reviewed and are negative.    Allergies  Review of patient's allergies indicates no known allergies.  Home Medications   Current Outpatient Rx  Name  Route  Sig  Dispense  Refill  . glimepiride (AMARYL) 2 MG tablet   Oral   Take 2 mg by mouth 2 (two) times daily.           Marland Kitchen  insulin aspart (NOVOLOG) 100 UNIT/ML injection   Subcutaneous   Inject 8 Units into the skin 2 (two) times daily.         . insulin glargine (LANTUS) 100 UNIT/ML injection   Subcutaneous   Inject 18 Units into the skin 2 (two) times daily.         . Multiple Minerals-Vitamins (CALCIUM-MAGNESIUM-ZINC) TABS   Oral   Take 1 tablet by mouth daily.         . pantoprazole (PROTONIX) 40 MG tablet   Oral   Take 1 tablet (40 mg total) by mouth daily.   30 tablet   11   . potassium chloride SA (K-DUR,KLOR-CON) 20 MEQ tablet   Oral   Take 20 mEq by mouth daily.         . propranolol (INDERAL) 20 MG tablet   Oral   Take 1 tablet (20 mg total) by mouth daily.   30 tablet   11   .  sitaGLIPtan-metformin (JANUMET) 50-1000 MG per tablet   Oral   Take 1 tablet by mouth 2 (two) times daily with a meal.             BP 135/86  Pulse 86  Temp(Src) 99 F (37.2 C) (Oral)  SpO2 97%  Physical Exam  Nursing note and vitals reviewed. Constitutional: He is oriented to person, place, and time. He appears well-developed and well-nourished. No distress.  HENT:  Head: Normocephalic and atraumatic.  Eyes: Conjunctivae and EOM are normal.  Neck: Normal range of motion.  Cardiovascular:  RRR, 3/6 systolic ej murmur. Intact distal pulses  Pulmonary/Chest: Effort normal.  Mild epigastric ttp. No abdominal distension or ascites noted.   Genitourinary: Guaiac positive stool.  Chaperone was present. Patient with no pain around the rectal area. There are no external fissures noted. No external hemorrhoids seen. Patient able to tolerate examination. Melena present. No gross red blood.   Musculoskeletal: Normal range of motion.  Neurological: He is alert and oriented to person, place, and time.  Good coordination, no CN deficits  Skin: Skin is warm and dry. No rash noted. He is not diaphoretic.  Small bruising lower extremities   Psychiatric: He has a normal mood and affect. His behavior is normal.    ED Course  Procedures (including critical care time)  Labs Reviewed  CBC - Abnormal; Notable for the following:    RBC 3.67 (*)    Hemoglobin 10.7 (*)    HCT 30.6 (*)    RDW 15.6 (*)    All other components within normal limits  COMPREHENSIVE METABOLIC PANEL - Abnormal; Notable for the following:    Glucose, Bld 206 (*)    BUN 32 (*)    Albumin 3.1 (*)    All other components within normal limits  AMMONIA - Abnormal; Notable for the following:    Ammonia 131 (*)    All other components within normal limits  PROTIME-INR - Abnormal; Notable for the following:    Prothrombin Time 16.5 (*)    All other components within normal limits  OCCULT BLOOD, POC DEVICE - Abnormal;  Notable for the following:    Fecal Occult Bld POSITIVE (*)    All other components within normal limits  APTT  TYPE AND SCREEN   No results found.  Consults: [facility].  Patient has been scheduled for an endoscopy this afternoon.  They request the patient be kept n.p.o., started on anterior right drip and Protonix as well as  be admitted by hospitalist do to significant comorbidities.  Patient has been seen in the emergency department.  Medications  0.9 %  sodium chloride infusion ( Intravenous New Bag/Given 10/27/12 1231)  pantoprazole (PROTONIX) injection 40 mg (not administered)  octreotide (SANDOSTATIN) 2 mcg/mL load via infusion 50 mcg (not administered)    And  octreotide (SANDOSTATIN) 2 mcg/mL in sodium chloride 0.9 % 250 mL infusion (not administered)  sodium chloride 0.9 % bolus 1,000 mL (1,000 mLs Intravenous New Bag/Given 10/27/12 1226)    Date: 10/27/2012  Rate: 90  Rhythm: normal sinus rhythm  QRS Axis: normal  Intervals: normal  ST/T Wave abnormalities: normal  Conduction Disutrbances: none  Narrative Interpretation:   Old EKG Reviewed: No significant changes noted   No diagnosis found.  MDM  Esophageal varices GI Bleed Pt w a hPMHx of chronic liver disease with advanced fibrosis, DM and portal hypertension presents emergency department complaining of hematemesis that occurred this morning at 130 a.m.  In addition patient reports having melena last evening.  Hemoccult was positive on exam.  Patient is found to be anemic, however he did not require transfusion at this time.  Gastroenterology consults as above.  Patient is currently without any abdominal pain at this time and with vital signs stable. BP 135/86  Pulse 86  Temp(Src) 99 F (37.2 C) (Oral)  SpO2 97%  The patient appears reasonably stabilized for admission considering the current resources, flow, and capabilities available in the ED at this time, and I doubt any other [facility] requiring further  screening and/or treatment in the ED prior to admission. Case seen and discussed with attending who is agreeable with plan.        Jaci Carrel, [place] 10/27/12 1251

## 2012-10-27 NOTE — ED Notes (Signed)
Pt from home with reports of hematemesis (coffee ground) and dark stool that started around midnight. Pt endorses hx of bleeding esophageal varices.

## 2012-10-27 NOTE — Op Note (Signed)
[facility] [address] [place], [address]   ENDOSCOPY PROCEDURE REPORT  PATIENT: [name], [name]  MR#: [id number] BIRTHDATE: 1951-01-18 , 62  yrs. old GENDER: Male ENDOSCOPIST: Louis Meckel, MD REFERRED BY: PROCEDURE DATE:  10/27/2012 PROCEDURE:  EGD w/ band ligation of varices ASA CLASS:     Class III INDICATIONS:  Hematemesis. MEDICATIONS: These medications were titrated to patient response per physician's verbal order, Versed 5 mg IV, and Fentanyl 50 mcg IV TOPICAL ANESTHETIC: Cetacaine Spray  DESCRIPTION OF PROCEDURE: After the risks benefits and alternatives of the procedure were thoroughly explained, informed consent was obtained.  The Pentax Gastroscope [id number] endoscope was introduced through the mouth and advanced to the third portion of the duodenum. Without limitations.  The instrument was slowly withdrawn as the mucosa was fully examined.        ESOPHAGUS: There were 2 columns of varices in the distal third of the esophagus.  The were and there was evidence of a red wale sign. There was a red wale sign on a single varix.  2 bands were applied to the varix.  There was an initial blush of bleeding that subsided.  3 other bands were applied to the remaining varices.  The remainder of the upper endoscopy exam was otherwise normal. there was old blood in the stomach. Retroflexed view of the cardia revealed no abnormalities.         The scope was then withdrawn from the patient and the procedure completed.  COMPLICATIONS: There were no complications. ENDOSCOPIC IMPRESSION: 1.   bleeding from esophageal varices-status post band ligation  RECOMMENDATIONS: continue PPI therapy and octreotide Rocephin  REPEAT EXAM: Followup endoscopy in approximately 2 weeks with Dr. Yancey Flemings eSigned:  Louis Meckel, MD 10/27/2012 4:44 PM   XB:[id number] Maisie Fus, MD Yancey Flemings, MD  PATIENT NAME:  [name], [name] MR#: [id number]

## 2012-10-27 NOTE — Progress Notes (Signed)
Endoscopy demonstrated grade 4 varices with red wale sign on a single varix. Banding was performed.  Recommendations #1 continue PPI therapy, octreotide #2 begin Rocephin 1 g every 24 hours

## 2012-10-27 NOTE — Telephone Encounter (Signed)
On concerned that he may have GI bleeding, possibly variceal. He needs to go to the ER stat. He should not drive. Alert the inpatient GI team. Thank you

## 2012-10-27 NOTE — Consult Note (Signed)
[facility] Consultation  Referring Provider:  [facility]    Primary Care Physician:  Robb Matar, MD Primary Gastroenterologist:    Yancey Flemings, MD     Reason for Consultation :  GI bleed            HPI: [name] is a 62 y.o. male with multiple medical problems. He is followed by Dr. 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 He also has a history of  Hypertension, diabetes, GERD, and adenomatous colon polyps.  He has been followed intermittently at [facility] hepatology/transplant clinic by Dr. Larey Dresser.   Patient has a history of bleeding esophageal varices requring ligation, last banding 2012.  Surveillance EGD Sept 2013 revealed only a small esophageal varix and portal gastropathy. Patient was due for surveillance EGD and was actually scheduled for May 19th. He is taking Inderal. Patient called office today with complaints of hematemesis. Our office directed him to ED.  He reports one episode of coffee ground emesis last night, none since but has had 2 black BMs since last night.  He has mild upper abdominal discomfort and is lightheaded.  Patient was recently started on a baby asa. He does take a daily PPI  Past Medical History  Diagnosis Date  . Pancytopenia   . Reflux esophagitis   . Portal hypertension   . Stricture and stenosis of esophagus   . Diverticulosis of colon (without mention of hemorrhage)   . Internal hemorrhoids without mention of complication   . Benign neoplasm of colon   . Duodenitis without mention of hemorrhage   . 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 Personal history of diseases of blood and blood-forming organs   . Cirrhosis of liver without mention of alcohol   . Unspecified essential hypertension   . Obesity, unspecified   . Calculus of kidney   . Type II or unspecified type diabetes mellitus without mention of complication, not stated as uncontrolled   . GERD (gastroesophageal  reflux disease)   . Hemorrhoids   . Colon polyps     Past Surgical History  Procedure Laterality Date  . Kidney stone surgery    . Esophagogastroduodenoscopy  08/01/2011    Procedure: ESOPHAGOGASTRODUODENOSCOPY (EGD);  Surgeon: Yancey Flemings, MD;  Location: [facility];  Service: Endoscopy;  Laterality: N/A;    Family History  Problem Relation Age of Onset  . Colon cancer Neg Hx   . Anesthesia problems Neg Hx   . Hypotension Neg Hx   . Malignant hyperthermia Neg Hx   . Pseudochol deficiency Neg Hx   . Diabetes Father   . Heart disease Father   . Aneurysm Mother   . Heart disease Other     neice    History  Substance Use Topics  . Smoking status: Never Smoker   . Smokeless tobacco: Never Used  . Alcohol Use: No     Comment: rare    Prior to Admission medications   Medication Sig Start Date End Date Taking? Authorizing Provider  glimepiride (AMARYL) 2 MG tablet Take 2 mg by mouth 2 (two) times daily.     Yes Historical Provider, MD  insulin aspart (NOVOLOG) 100 UNIT/ML injection Inject 8 Units into the skin 2 (two) times daily.   Yes Historical Provider, MD  insulin glargine (LANTUS) 100 UNIT/ML injection Inject 18 Units into the skin 2 (two) times daily.   Yes Historical Provider, MD  Multiple Minerals-Vitamins (CALCIUM-MAGNESIUM-ZINC) TABS Take 1 tablet by mouth  daily.   Yes Historical Provider, MD  pantoprazole (PROTONIX) 40 MG tablet Take 1 tablet (40 mg total) by mouth daily. 02/24/12  Yes Hilarie Fredrickson, MD  potassium chloride SA (K-DUR,KLOR-CON) 20 MEQ tablet Take 20 mEq by mouth daily.   Yes Historical Provider, MD  propranolol (INDERAL) 20 MG tablet Take 1 tablet (20 mg total) by mouth daily. 02/24/12  Yes Hilarie Fredrickson, MD  sitaGLIPtan-metformin (JANUMET) 50-1000 MG per tablet Take 1 tablet by mouth 2 (two) times daily with a meal.     Yes Historical Provider, MD    Current Facility-Administered Medications  Medication Dose Route Frequency Provider Last Rate Last Dose   . 0.9 %  sodium chloride infusion   Intravenous Continuous Lisette Paz, PA-C      . sodium chloride 0.9 % bolus 1,000 mL  1,000 mL Intravenous Once Jaci Carrel, PA-C       Current Outpatient Prescriptions  Medication Sig Dispense Refill  . glimepiride (AMARYL) 2 MG tablet Take 2 mg by mouth 2 (two) times daily.        . insulin aspart (NOVOLOG) 100 UNIT/ML injection Inject 8 Units into the skin 2 (two) times daily.      . insulin glargine (LANTUS) 100 UNIT/ML injection Inject 18 Units into the skin 2 (two) times daily.      . Multiple Minerals-Vitamins (CALCIUM-MAGNESIUM-ZINC) TABS Take 1 tablet by mouth daily.      . pantoprazole (PROTONIX) 40 MG tablet Take 1 tablet (40 mg total) by mouth daily.  30 tablet  11  . potassium chloride SA (K-DUR,KLOR-CON) 20 MEQ tablet Take 20 mEq by mouth daily.      . propranolol (INDERAL) 20 MG tablet Take 1 tablet (20 mg total) by mouth daily.  30 tablet  11  . sitaGLIPtan-metformin (JANUMET) 50-1000 MG per tablet Take 1 tablet by mouth 2 (two) times daily with a meal.          Allergies as of 10/27/2012  . (No Known Allergies)   Review of Systems:    All systems reviewed and negative except where noted in HPI.  PHYSICAL EXAM: Vital signs in last 24 hours:  BP 135/86, HR 86, temp 99, O2sat 97%   General:   Pleasant white male in NAD Head:  Normocephalic and atraumatic. Eyes:   No icterus.   Conjunctiva pink. Ears:  Normal auditory acuity. Neck:  Supple; no masses felt Lungs:  Respirations even and unlabored. Lungs clear to auscultation bilaterally.   No wheezes, crackles, or rhonchi.  Heart:  Regular rate and rhythm. Soft murmur heard best over left 2nd intercostal. Abdomen:  Soft, nondistended, nontender. Normal bowel sounds. No appreciable masses or hepatomegaly.  Rectal:  Not performed. Heme positive stool by ED staff a few minutes ago Msk:  Symmetrical without gross deformities.  Extremities:  Without edema. Neurologic:  Alert and   oriented x4;  grossly normal neurologically. Skin:  Intact without significant lesions or rashes. Cervical Nodes:  No significant cervical adenopathy. Psych:  Alert and cooperative. Normal affect.   PREVIOUS ENDOSCOPIES:      Multiple upper endoscopies. Last one Sept 2013, see HPI.         Surveillance colonoscopy Sept 2013. Findings: Pedunculated polyp measuring 1.2 cm in size was found in the ascending colon; polypectomy was performed using snare cautery. 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 62 year old male with history of cirrhosis (Nash related?) complicated by portal htn.  His last varices banding was in 2012. No significant varices on 2013 EGD. Patient  scheduled for surveillance EGD May 19th   2. Upper GI bleed with one pisode of coffee ground emesis last night and passage of two black stools. Rule out variceal hemorrhage. Bleeding may instead be from portal gastropathy or even PUD.  He is hemodynamically stable. Initiate IV PPI, Octreotide drip. Plan is for EGD today. Keep NPO. I spoke with ED provider Jaci Carrel, PA-C regarding plans.   3. Anemia of acute blood loss, Patient's hgb is currently at 10.7, I don't have a good baseline as last CBC in computer was Sept 2011 when hgb was 13. He has been typed and crossed for blood  3. Diabetes, on oral agents and insulin  4. HTN  Thanks   LOS: 0 days   Willette Cluster  10/27/2012, 11:48 AM  Chart was reviewed and patient was examined. X-rays and lab were reviewed.    I agree with management and plans.  In view of cirrhosis/portal hypertension would add antibiotic (rocephin)  Barbette Hair. Arlyce Dice, M.D., [facility] Cell [phone]

## 2012-10-27 NOTE — Telephone Encounter (Signed)
Patient with a history of cirrhosis and esophageal varices. He is scheduled for EGD with banding in may.  He reports that last night had an "episode of sweating and severe weakness" followed by vomiting of "what looked like blood".  Notes this am diarrhea with black tarry stools.  He reports that he has no energy and just feels "weak".  He is asked to be NPO I will discuss with Dr. Marina Goodell and call him right back.

## 2012-10-27 NOTE — ED Provider Notes (Signed)
Medical screening examination/treatment/procedure(s) were conducted as a shared visit with non-physician practitioner(s) and myself.  I personally evaluated the patient during the encounter  Will admit for upper GI bleed. Octreotide. Saw GI. EGD later today  Lyanne Co, MD 10/27/12 1311

## 2012-10-27 NOTE — Telephone Encounter (Signed)
Discussed with Dr. Marina Goodell patient to go to [facility] ER for eval..  Patient's wife advised to take him to ER, that he should not drive himself .  Willette Cluster RNP notified that patient on the way to the ER.

## 2012-10-28 ENCOUNTER — Encounter (HOSPITAL_COMMUNITY): Payer: Self-pay | Admitting: Gastroenterology

## 2012-10-28 DIAGNOSIS — R1084 Generalized abdominal pain: Secondary | ICD-10-CM

## 2012-10-28 DIAGNOSIS — D126 Benign neoplasm of colon, unspecified: Secondary | ICD-10-CM

## 2012-10-28 DIAGNOSIS — I8501 Esophageal varices with bleeding: Secondary | ICD-10-CM

## 2012-10-28 LAB — BASIC METABOLIC PANEL
BUN: 24 mg/dL — ABNORMAL HIGH (ref 6–23)
Calcium: 8.1 mg/dL — ABNORMAL LOW (ref 8.4–10.5)
Creatinine, Ser: 0.68 mg/dL (ref 0.50–1.35)
GFR calc non Af Amer: >90 mL/min (ref >90)
Glucose, Bld: 165 mg/dL — ABNORMAL HIGH (ref 70–99)
Sodium: 140 mEq/L (ref 135–145)

## 2012-10-28 LAB — GLUCOSE, CAPILLARY
Glucose-Capillary: 142 mg/dL — ABNORMAL HIGH (ref 70–99)
Glucose-Capillary: 237 mg/dL — ABNORMAL HIGH (ref 70–99)
Glucose-Capillary: 256 mg/dL — ABNORMAL HIGH (ref 70–99)
Glucose-Capillary: 205 mg/dL — ABNORMAL HIGH (ref 70–99)

## 2012-10-28 LAB — CBC
Hemoglobin: 8.4 g/dL — ABNORMAL LOW (ref 13.0–17.0)
MCH: 29 pg (ref 26.0–34.0)
MCHC: 34.1 g/dL (ref 30.0–36.0)

## 2012-10-28 LAB — HEMOGLOBIN AND HEMATOCRIT, BLOOD
HCT: 24.6 % — ABNORMAL LOW (ref 39.0–52.0)
Hemoglobin: 8.5 g/dL — ABNORMAL LOW (ref 13.0–17.0)

## 2012-10-28 MED ORDER — SODIUM CHLORIDE 0.9 % IV SOLN: INTRAVENOUS | Status: DC

## 2012-10-28 MED ORDER — PANTOPRAZOLE SODIUM 40 MG IV SOLR
40.0000 mg | Freq: Two times a day (BID) | INTRAVENOUS | Status: DC
  Administered 2012-10-28 – 2012-10-29 (×2): 40 mg via INTRAVENOUS
  Filled 2012-10-28 (×3): qty 40

## 2012-10-28 MED ORDER — PANTOPRAZOLE SODIUM 40 MG IV SOLR
40.0000 mg | Freq: Two times a day (BID) | INTRAVENOUS | Status: DC
  Filled 2012-10-28 (×2): qty 40

## 2012-10-28 MED ORDER — CIPROFLOXACIN HCL 500 MG PO TABS
500.0000 mg | ORAL_TABLET | Freq: Two times a day (BID) | ORAL | Status: DC
  Administered 2012-10-29: 500 mg via ORAL
  Filled 2012-10-28 (×3): qty 1

## 2012-10-28 MED ORDER — SITAGLIPTIN PHOS-METFORMIN HCL 50-1000 MG PO TABS: 1.0000 | ORAL_TABLET | Freq: Two times a day (BID) | ORAL | Status: DC

## 2012-10-28 MED ORDER — PANTOPRAZOLE SODIUM 40 MG PO TBEC: 40.0000 mg | DELAYED_RELEASE_TABLET | Freq: Two times a day (BID) | ORAL | Status: DC

## 2012-10-28 MED ORDER — LINAGLIPTIN 5 MG PO TABS
5.0000 mg | ORAL_TABLET | Freq: Every day | ORAL | Status: DC
  Administered 2012-10-28 – 2012-10-29 (×2): 5 mg via ORAL
  Filled 2012-10-28 (×2): qty 1

## 2012-10-28 MED ORDER — METFORMIN HCL 500 MG PO TABS
1000.0000 mg | ORAL_TABLET | Freq: Two times a day (BID) | ORAL | Status: DC
  Administered 2012-10-28 – 2012-10-29 (×2): 1000 mg via ORAL
  Filled 2012-10-28 (×4): qty 2

## 2012-10-28 NOTE — Progress Notes (Signed)
   CARE MANAGEMENT NOTE 10/28/2012  Patient:  [name], [name]   Account Number:  [id number]  Date Initiated:  10/28/2012  Documentation initiated by:  Jiles Crocker  Subjective/Objective Assessment:   ADMITTED WITH GIB     Action/Plan:   Primary Care Physician:  Robb Matar, MD  Primary Gastroenterologist:    Yancey Flemings, MD  followed intermittently at [facility] hepatology/transplant clinic by Dr. Larey Dresser.  LIVES AT HOME WITH SPOUSE; CM FOLLOWING FOR DCP   Anticipated DC Date:  10/30/2012   Anticipated DC Plan:  HOME/SELF CARE      DC Planning Services  CM consult  Status of service:  In process, will continue to follow Medicare Important Message given?  NA - LOS <3 / Initial given by admissions (If response is "NO", the following Medicare IM given date fields will be blank  Per UR Regulation:  Reviewed for med. necessity/level of care/duration of stay Comments:  10/28/2012- B Haasini Patnaude RN,BSN,MHA

## 2012-10-28 NOTE — Progress Notes (Signed)
Patient ID: [name], male   DOB: 1950/08/21, 62 y.o.   MRN: [id number] [facility] Progress Note  Subjective: [facility] ,just a little weak. No further emesis, no stools HGB down to 8.4  Objective:  Vital signs in last 24 hours: Temp:  [97.1 F (36.2 C)-99.3 F (37.4 C)] 99.3 F (37.4 C) (04/24 0845) Pulse Rate:  [67-89] 80 (04/24 0845) Resp:  [10-27] 16 (04/24 0845) BP: (114-149)/(55-86) 130/60 mmHg (04/24 0845) SpO2:  [97 %-100 %] 100 % (04/24 0845) Weight:  [222 lb 7.1 oz (100.9 kg)-225 lb (102.059 kg)] 222 lb 7.1 oz (100.9 kg) (04/23 1842)   General:   Alert,  Well-developed, WM   in NAD Heart:  Regular rate and rhythm; no murmurs Pulm;clear Abdomen:  Soft, nontender and nondistended. Normal bowel sounds, without guarding, and without rebound.   Extremities:  Without edema. Neurologic:  Alert and  oriented x4;  grossly normal neurologically. Psych:  Alert and cooperative. Normal mood and affect.  Intake/Output from previous day: 04/23 0701 - 04/24 0700 In: 770.8 [I.V.:770.8] Out: 950 [Urine:950] Intake/Output this shift: Total I/O In: 360 [P.O.:360] Out: 300 [Urine:300]  Lab Results:  Recent Labs  10/27/12 1145 10/27/12 1842 10/28/12 0045 10/28/12 0642  WBC 9.7  --   --  6.4  HGB 10.7* 9.0* 8.8* 8.4*  HCT 30.6* 26.2* 25.4* 24.6*  PLT 88*  --   --  PLATELET CLUMPS NOTED ON SMEAR, COUNT APPEARS DECREASED   BMET  Recent Labs  10/27/12 1145 10/28/12 0642  NA 137 140  K 4.4 4.1  CL 102 107  CO2 24 27  GLUCOSE 206* 165*  BUN 32* 24*  CREATININE 0.56 0.68  CALCIUM 9.8 8.1*   LFT  Recent Labs  10/27/12 1145  PROT 6.4  ALBUMIN 3.1*  AST 26  ALT 27  ALKPHOS 45  BILITOT 1.1   PT/INR  Recent Labs  10/27/12 1145  LABPROT 16.5*  INR 1.37     Assessment / Plan: #1 62 yo male with acute varicele bleed- stable s/p banding yesterday Continue Octreotide x 48 hours IV PPI BID Continue Rocephin Advance to full liquids  later today He will need repeat banding as outpt with Dr. Marina Goodell in 2 weeks #2 anemia-acute on chronic secondary to GI Bleed- transfuse for hgb less than 8 #3AODM Active Problems:   DM   HYPERTENSION   ESOPHAGITIS, REFLUX   CIRRHOSIS   Portal hypertension   UGIB (upper gastrointestinal bleed)   Anemia due to acute blood loss   Volume depletion   Esophageal varices with bleeding     LOS: 1 day   [name]  10/28/2012, 10:29 AM  I have personally taken an interval history, reviewed the chart, and examined the patient.  I agree with the extender's note, impression and recommendations.  OK to advance to soft diet.  Barbette Hair. Arlyce Dice, MD, [facility] [phone]

## 2012-10-28 NOTE — Progress Notes (Signed)
Inpatient Diabetes Program Recommendations  AACE/ADA: New Consensus Statement on Inpatient Glycemic Control (2013)  Target Ranges:  Prepandial:   less than 140 mg/dL      Peak postprandial:   less than 180 mg/dL (1-2 hours)      Critically ill patients:  140 - 180 mg/dL   Reason for Visit: Hyperglycemia  Results for JEHU, MCCAUSLIN (MRN [id number]) as of 10/28/2012 15:29  Ref. Range 10/27/2012 21:47 10/28/2012 07:30 10/28/2012 11:34  Glucose-Capillary Latest Range: 70-99 mg/dL 696 (H) 295 (H) 284 (H)   Hyperglycemia - Pt on rapid-acting insulin (Novolog 8 units bid) for meal coverage insulin at home  Inpatient Diabetes Program Recommendations Insulin - Meal Coverage: Needs meal coverage insulin - Novolog 4 units tidwc if pt eats >50% meals  Note: Will continue to follow.  Thank you. Ailene Ards, RD, LDN, CDE Inpatient Diabetes Coordinator [phone]

## 2012-10-28 NOTE — Progress Notes (Signed)
[facility]                                                                                Patient Demographics  [name], is a 62 y.o. male, DOB - 12-23-1950, ZHY:[id number], EXB:[id number]  Admit date - 10/27/2012  Admitting Physician Kela Millin, MD  Outpatient Primary MD for the patient is Robb Matar, MD  LOS - 1   Chief Complaint  Patient presents with  . GI Bleeding  . Hematemesis  . Rectal Bleeding        Assessment & Plan    UGIB (upper gastrointestinal bleed) due to variceal bleed secondary to portal hypertension from underlying cirrhosis (NASH)   -He is status post EGD with banding of the varices, H&H is stable although there has been some drop from bleeding and dilution, continue to monitor H&H, switch PPI to oral, tapered octreotide drip, Propranolol to be continued, advance diet, type screen has been done and H&H will be closely monitored. Will be switched to oral Cipro tomorrow morning with total of 5 days of treatment, note he will receive 2 doses of Rocephin, discussed with Dr. Arlyce Dice if stable discharge in the morning.     Anemia due to acute blood loss  -secondary to above, cycle HH, and transfuse as above goal will be to keep hemoglobin above 8    Volume depletion  Stable after IV fluids stop IV fluids    HYPERTENSION  -Home medications will be resumed as tolerated    ESOPHAGITIS, REFLUX  -PPI which to oral    CIRRHOSIS/Portal hypertension/Nash  -per GI     DM-2  Continue Lantus and sliding-scale, plus will resume Janumet  Lab Results  Component Value Date   HGBA1C  Value: 7.8 (NOTE)   The ADA recommends the following therapeutic goal for glycemic   control related to Hgb A1C measurement:   Goal of Therapy:   < 7.0% Hgb A1C   Reference: American Diabetes Association: Clinical Practice   Recommendations 2008, Diabetes Care,  2008, 31:(Suppl 1).* 05/13/2008    CBG (last 3)   Recent Labs  10/27/12 1547  10/27/12 2147 10/28/12 0730  GLUCAP 165* 206* 142*      Code Status: Full  Family Communication: None present  Disposition Plan: Home   Procedures EGD with banding of varices   Consults  GI   DVT Prophylaxis   SCDs    Lab Results  Component Value Date   PLT PLATELET CLUMPS NOTED ON SMEAR, COUNT APPEARS DECREASED 10/28/2012    Medications  Scheduled Meds: . cefTRIAXone (ROCEPHIN)  IV  1 g Intravenous Q24H  . [START ON 10/29/2012] ciprofloxacin  500 mg Oral BID  . insulin aspart  0-9 Units Subcutaneous TID WC  . insulin glargine  18 Units Subcutaneous BID  . pantoprazole (PROTONIX) IV  40 mg Intravenous Q12H  . propranolol  20 mg Oral Daily  . sodium chloride  3 mL Intravenous Q12H   Continuous Infusions: . octreotide (SANDOSTATIN) infusion 40 mcg/hr (10/28/12 0944)   PRN Meds:.acetaminophen, acetaminophen, ondansetron (ZOFRAN) IV, ondansetron  Antibiotics     Anti-infectives   Start     Dose/Rate Route Frequency Ordered Stop  10/29/12 0800  ciprofloxacin (CIPRO) tablet 500 mg     500 mg Oral 2 times daily 10/28/12 1008     10/27/12 1700  cefTRIAXone (ROCEPHIN) 1 g in dextrose 5 % 50 mL IVPB     1 g 100 mL/hr over 30 Minutes Intravenous Every 24 hours 10/27/12 1617 10/29/12 1659       Time Spent in minutes   35   Wakeelah Solan K M.D on 10/28/2012 at 10:11 AM  Between 7am to 7pm - Pager - [phone]  After 7pm go to [email or web address] - password [email or web address]  And look for the night coverage person covering for me after hours  [facility] Office  [phone]    Subjective:   Kieffer Blatz today has, No headache, No chest pain, No abdominal pain - No Nausea, No new weakness tingling or numbness, No Cough - SOB.    Objective:   Filed Vitals:   10/27/12 1842 10/27/12 2100 10/28/12 0515 10/28/12 0845  BP: 121/66 114/69 116/67 130/60  Pulse: 82 69 67 80  Temp: 97.1 F (36.2 C) 98.2 F (36.8 C) 98 F (36.7 C) 99.3 F (37.4 C)  TempSrc:  Axillary Oral Oral Oral  Resp: 12 14 18 16   Height:      Weight: 100.9 kg (222 lb 7.1 oz)     SpO2: 100% 100% 100% 100%    Wt Readings from Last 3 Encounters:  10/27/12 100.9 kg (222 lb 7.1 oz)  10/27/12 100.9 kg (222 lb 7.1 oz)  03/31/12 102.059 kg (225 lb)     Intake/Output Summary (Last 24 hours) at 10/28/12 1011 Last data filed at 10/28/12 1001  Gross per 24 hour  Intake 1130.83 ml  Output   1250 ml  Net -119.17 ml    Exam Awake Alert, Oriented X 3, No new F.N deficits, Normal affect Humphrey.AT,PERRAL Supple Neck,No JVD, No cervical lymphadenopathy appriciated.  Symmetrical Chest wall movement, Good air movement bilaterally, CTAB RRR,No Gallops,Rubs or new Murmurs, No Parasternal Heave +ve B.Sounds, Abd Soft, Non tender, No organomegaly appriciated, No rebound - guarding or rigidity. No Cyanosis, Clubbing or edema, No new Rash or bruise      Data Review   Micro Results No results found for this or any previous visit (from the past 240 hour(s)).  Radiology Reports No results found.  CBC  Recent Labs Lab 10/27/12 1145 10/27/12 1842 10/28/12 0045 10/28/12 0642  WBC 9.7  --   --  6.4  HGB 10.7* 9.0* 8.8* 8.4*  HCT 30.6* 26.2* 25.4* 24.6*  PLT 88*  --   --  PLATELET CLUMPS NOTED ON SMEAR, COUNT APPEARS DECREASED  MCV 83.4  --   --  84.8  MCH 29.2  --   --  29.0  MCHC 35.0  --   --  34.1  RDW 15.6*  --   --  15.7*    Chemistries   Recent Labs Lab 10/27/12 1145 10/28/12 0642  NA 137 140  K 4.4 4.1  CL 102 107  CO2 24 27  GLUCOSE 206* 165*  BUN 32* 24*  CREATININE 0.56 0.68  CALCIUM 9.8 8.1*  AST 26  --   ALT 27  --   ALKPHOS 45  --   BILITOT 1.1  --    ------------------------------------------------------------------------------------------------------------------ estimated creatinine clearance is 116.9 ml/min (by C-G formula based on Cr of  0.68). ------------------------------------------------------------------------------------------------------------------ No results found for this basename: HGBA1C,  in the last 72 hours ------------------------------------------------------------------------------------------------------------------ No results found for this basename: CHOL,  HDL, LDLCALC, TRIG, CHOLHDL, LDLDIRECT,  in the last 72 hours ------------------------------------------------------------------------------------------------------------------ No results found for this basename: TSH, T4TOTAL, FREET3, T3FREE, THYROIDAB,  in the last 72 hours ------------------------------------------------------------------------------------------------------------------ No results found for this basename: VITAMINB12, FOLATE, FERRITIN, TIBC, IRON, RETICCTPCT,  in the last 72 hours  Coagulation profile  Recent Labs Lab 10/27/12 1145  INR 1.37    No results found for this basename: DDIMER,  in the last 72 hours  Cardiac Enzymes No results found for this basename: CK, CKMB, TROPONINI, MYOGLOBIN,  in the last 168 hours ------------------------------------------------------------------------------------------------------------------ No components found with this basename: POCBNP,

## 2012-10-29 LAB — GLUCOSE, CAPILLARY: Glucose-Capillary: 126 mg/dL — ABNORMAL HIGH (ref 70–99)

## 2012-10-29 LAB — HEMOGLOBIN AND HEMATOCRIT, BLOOD: Hemoglobin: 8.1 g/dL — ABNORMAL LOW (ref 13.0–17.0)

## 2012-10-29 MED ORDER — PANTOPRAZOLE SODIUM 40 MG PO TBEC: 40.0000 mg | DELAYED_RELEASE_TABLET | Freq: Two times a day (BID) | ORAL | Status: DC

## 2012-10-29 MED ORDER — CIPROFLOXACIN HCL 500 MG PO TABS: 500.0000 mg | ORAL_TABLET | Freq: Two times a day (BID) | ORAL | Status: DC

## 2012-10-29 NOTE — Discharge Summary (Signed)
[facility]                                                                                   [name], is a 62 y.o. male  DOB 1950/07/09  MRN [id number].  Admission date:  10/27/2012  Discharge Date:  10/29/2012  Primary MD  Robb Matar, MD  Admitting Physician  Kela Millin, MD  Admission Diagnosis  Upper GI bleed [578.9]  Discharge Diagnosis     Active Problems:   DM   HYPERTENSION   ESOPHAGITIS, REFLUX   CIRRHOSIS   Portal hypertension   UGIB (upper gastrointestinal bleed)   Anemia due to acute blood loss   Volume depletion   Esophageal varices with bleeding    Past Medical History  Diagnosis Date  . Pancytopenia   . Reflux esophagitis   . Portal hypertension   . Stricture and stenosis of esophagus   . Diverticulosis of colon (without mention of hemorrhage)   . Internal hemorrhoids without mention of complication   . Benign neoplasm of colon   . Duodenitis without mention of hemorrhage   . 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 Personal history of diseases of blood and blood-forming organs   . Cirrhosis of liver without mention of alcohol   . Unspecified essential hypertension   . Obesity, unspecified   . Calculus of kidney   . Type II or unspecified type diabetes mellitus without mention of complication, not stated as uncontrolled   . GERD (gastroesophageal reflux disease)   . Hemorrhoids   . Colon polyps     Past Surgical History  Procedure Laterality Date  . Kidney stone surgery    . Esophagogastroduodenoscopy  08/01/2011    Procedure: ESOPHAGOGASTRODUODENOSCOPY (EGD);  Surgeon: Yancey Flemings, MD;  Location: [facility];  Service: Endoscopy;  Laterality: N/A;  . Esophagogastroduodenoscopy N/A 10/27/2012    Procedure: ESOPHAGOGASTRODUODENOSCOPY (EGD);  Surgeon: Louis Meckel, MD;  Location: [facility];  Service: Endoscopy;  Laterality: N/A;     Recommendations for primary care physician for things to follow:        Discharge Diagnoses:   Active Problems:   DM   HYPERTENSION   ESOPHAGITIS, REFLUX   CIRRHOSIS   Portal hypertension   UGIB (upper gastrointestinal bleed)   Anemia due to acute blood loss   Volume depletion   Esophageal varices with bleeding    Discharge Condition: Stable   Diet recommendation: See Discharge Instructions below   Consults GI-EGD with banding    History of present illness and  Hospital Course:     Kindly see H&P for history of present illness and admission details, please review complete Labs, Consult reports and Test reports for all details in brief [name], is a 62 y.o. 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 Arlyce Dice GI and underwent EGD with banding of his paresis, his H&H has remained stable, he will be given total 5 days of antibiotics  he has received 3 doses of Rocephin and will be given 3 more days of Cipro per Dr. Arlyce Dice, I have discussed his case with Dr. Arlyce Dice in detail today he stable for discharge with close outpatient PCP and GI followup. He will be placed on PPI twice a day upon discharge.      She did develop some anemia due to UGI blood loss but he did not require any transfusions. For his type 2 diabetes mellitus, hypertension his home medications will be continued unchanged.       Today   Subjective:   [name] today has no headache,no chest abdominal pain,no new weakness tingling or numbness, feels much better wants to go home today.    Objective:   Blood pressure 116/62, pulse 54, temperature 97.9 F (36.6 C), temperature source Oral, resp. rate 16, height 5\' 10"  (1.778 m), weight 100.9 kg (222 lb 7.1 oz), SpO2 97.00%.   Intake/Output Summary (Last 24 hours) at 10/29/12 1012 Last data filed at  10/29/12 0600  Gross per 24 hour  Intake  284.5 ml  Output   1500 ml  Net -1215.5 ml    Exam Awake Alert, Oriented *3, No new F.N deficits, Normal affect [place].AT,PERRAL Supple Neck,No JVD, No cervical lymphadenopathy appriciated.  Symmetrical Chest wall movement, Good air movement bilaterally, CTAB RRR,No Gallops,Rubs or new Murmurs, No Parasternal Heave +ve B.Sounds, Abd Soft, Non tender, No organomegaly appriciated, No rebound -guarding or rigidity. No Cyanosis, Clubbing or edema, No new Rash or bruise  Data Review   Major procedures and Radiology Reports - PLEASE review detailed and final reports for all details in brief -       No results found.  Micro Results      No results found for this or any previous visit (from the past 240 hour(s)).   CBC w Diff: Lab Results  Component Value Date   WBC 6.4 10/28/2012   HGB 8.1* 10/29/2012   HCT 23.5* 10/29/2012   PLT PLATELET CLUMPS NOTED ON SMEAR, COUNT APPEARS DECREASED 10/28/2012   LYMPHOPCT 25.1 03/28/2010   MONOPCT 13.0* 03/28/2010   EOSPCT 3.4 03/28/2010   BASOPCT 0.4 03/28/2010    CMP: Lab Results  Component Value Date   NA 140 10/28/2012   K 4.1 10/28/2012   CL 107 10/28/2012   CO2 27 10/28/2012   BUN 24* 10/28/2012   CREATININE 0.68 10/28/2012   PROT 6.4 10/27/2012   ALBUMIN 3.1* 10/27/2012   BILITOT 1.1 10/27/2012   ALKPHOS 45 10/27/2012   AST 26 10/27/2012   ALT 27 10/27/2012  .   Discharge Instructions     Follow with Primary MD Robb Matar, MD in 3 days   Get CBC, CMP, checked 3 days by Primary MD and again as instructed by your Primary MD.   Get Medicines reviewed and adjusted.  Please request your Prim.MD to go over all Hospital Tests and Procedure/Radiological results at the follow up, please get all Hospital records sent to your Prim MD by signing hospital release before you go home.  Activity: As tolerated with Full fall precautions use walker/cane & assistance as needed   Diet:  Soft  Low carb -  Heart healthy diet, advance to regular consistency over the next 4-5 days  For Heart failure patients - Check your Weight same time everyday, if you gain over 2 pounds, or you develop in leg swelling, experience more shortness of breath or chest pain, call your Primary MD immediately. Follow Cardiac Low Salt  Diet and 1.8 lit/day fluid restriction.  Disposition Home   If you experience worsening of your admission symptoms, develop shortness of breath, life threatening emergency, suicidal or homicidal thoughts you must seek medical attention immediately by calling 911 or calling your MD immediately  if symptoms less severe.  You Must read complete instructions/literature along with all the possible adverse reactions/side effects for all the Medicines you take and that have been prescribed to you. Take any new Medicines after you have completely understood and accpet all the possible adverse reactions/side effects.   Do not drive and provide baby sitting services if your were admitted for syncope or siezures until you have seen by Primary MD or a Neurologist and advised to do so again.  Do not drive when taking Pain medications.    Do not take more than prescribed Pain, Sleep and Anxiety Medications  Special Instructions: If you have smoked or chewed Tobacco  in the last 2 yrs please stop smoking, stop any regular Alcohol  and or any Recreational drug use.  Wear Seat belts while driving.    Follow-up Information   Follow up with Yancey Flemings, MD On 11/22/2012. (for endocopy with banding at [facility] at 12 pm arrive by 11 am and nothing to eat or drink after midnight.)    Contact information:   [address] [place] [address] [phone]       Follow up with Robb Matar, MD. Schedule an appointment as soon as possible for a visit in 3 days.   Contact information:   [address] [place] [address] [phone]         Discharge Medications     Medication  List    TAKE these medications       Calcium-Magnesium-Zinc Tabs  Take 1 tablet by mouth daily.     ciprofloxacin 500 MG tablet  Commonly known as:  CIPRO  Take 1 tablet (500 mg total) by mouth 2 (two) times daily.     glimepiride 2 MG tablet  Commonly known as:  AMARYL  Take 2 mg by mouth 2 (two) times daily.     insulin aspart 100 UNIT/ML injection  Commonly known as:  novoLOG  Inject 8 Units into the skin 2 (two) times daily.     insulin glargine 100 UNIT/ML injection  Commonly known as:  LANTUS  Inject 18 Units into the skin 2 (two) times daily.     pantoprazole 40 MG tablet  Commonly known as:  PROTONIX  Take 1 tablet (40 mg total) by mouth 2 (two) times daily.     potassium chloride SA 20 MEQ tablet  Commonly known as:  K-DUR,KLOR-CON  Take 20 mEq by mouth daily.     propranolol 20 MG tablet  Commonly known as:  INDERAL  Take 1 tablet (20 mg total) by mouth daily.     sitaGLIPtan-metformin 50-1000 MG per tablet  Commonly known as:  JANUMET  Take 1 tablet by mouth 2 (two) times daily with a meal.           Total Time in preparing paper work, data evaluation and todays exam - 35 minutes  Leroy Sea M.D on 10/29/2012 at 10:12 AM  [facility] Office  [phone]

## 2012-10-29 NOTE — Progress Notes (Signed)
Quiet night.  No further bleeding. OK to advance diet, d/c home.  Complete 5 day course of antibiotics with cipro. F/u with Dr. Yancey Flemings in about 2 weeks.  He will need repeat EGD.

## 2012-10-29 NOTE — Progress Notes (Signed)
Note given to pt for work.  Dr Thedore Mins states he can return to work 7 days from admission.  No changes noted since am assessment.

## 2012-10-29 NOTE — Progress Notes (Signed)
Discharge instructions went over with patient and spouse.They understood directions and had no questions. [name], SN-[facility]

## 2012-11-02 ENCOUNTER — Other Ambulatory Visit: Payer: Self-pay | Admitting: Internal Medicine

## 2012-11-02 ENCOUNTER — Telehealth: Payer: Self-pay | Admitting: Internal Medicine

## 2012-11-02 DIAGNOSIS — K7460 Unspecified cirrhosis of liver: Secondary | ICD-10-CM

## 2012-11-02 NOTE — Telephone Encounter (Signed)
Pt had egd with banding of varices 10/27/12. Pt states that since he went home he has noticed that his belly seems tight. States that he actually lost weight in the hospital but it looks like he has gained weight. Pt states he is not SOB, states that his stomach "woke him up at 5am with a stomach ache." Pt states he took pepto bismol and it felt better. Pt is concerned about stomach, states it looks "swollen." Dr. Marina Goodell please advise.

## 2012-11-02 NOTE — Telephone Encounter (Signed)
Pt scheduled for US guided paracentesis at [facility] 11/04/12. Pt to arrive at [facility] at 12:45pm and [place] will be at 1pm. Pt aware of appt date and time.

## 2012-11-02 NOTE — Telephone Encounter (Signed)
I recommend that he have an abdominal ultrasound ASAP to rule out ascites. If there is ascites, he should have a paracentesis (diagnostic) and send fluid for cell count differential. Thanks

## 2012-11-04 ENCOUNTER — Ambulatory Visit (HOSPITAL_COMMUNITY)
Admission: RE | Admit: 2012-11-04 | Discharge: 2012-11-04 | Disposition: A | Discharge status: Home / Self Care | Payer: Managed Care, Other (non HMO) | Source: Ambulatory Visit | Attending: Internal Medicine | Admitting: Internal Medicine

## 2012-11-04 ENCOUNTER — Encounter (HOSPITAL_COMMUNITY): Payer: Self-pay | Admitting: *Deleted

## 2012-11-04 VITALS — BP 102/59

## 2012-11-04 DIAGNOSIS — R188 Other ascites: Secondary | ICD-10-CM | POA: Insufficient documentation

## 2012-11-04 DIAGNOSIS — K7460 Unspecified cirrhosis of liver: Secondary | ICD-10-CM | POA: Insufficient documentation

## 2012-11-04 LAB — BODY FLUID CELL COUNT WITH DIFFERENTIAL
Lymphs, Fluid: 28 %
Monocyte-Macrophage-Serous Fluid: 68 % (ref 50–90)
Neutrophil Count, Fluid: 4 % (ref 0–25)

## 2012-11-04 NOTE — Procedures (Signed)
US guided diagnostic/therapeutic paracentesis performed yielding 1.6 liters clear, light yellow fluid. A portion of the fluid was sent to the lab for preordered studies. No immediate complications. 

## 2012-11-04 NOTE — Progress Notes (Signed)
Cbc with dif and cmet results 11-03-2012 [facility] on chart. Cbc with dif results from 11-03-2012 faxed to dr Marina Goodell, fax confirmation received and placed on chart. ekg 4-23 2014 epic

## 2012-11-05 ENCOUNTER — Other Ambulatory Visit: Payer: Self-pay | Admitting: Internal Medicine

## 2012-11-05 DIAGNOSIS — K7460 Unspecified cirrhosis of liver: Secondary | ICD-10-CM

## 2012-11-05 MED ORDER — FUROSEMIDE 20 MG PO TABS: 20.0000 mg | ORAL_TABLET | Freq: Every day | ORAL | Status: DC

## 2012-11-05 MED ORDER — SPIRONOLACTONE 100 MG PO TABS: 100.0000 mg | ORAL_TABLET | Freq: Every day | ORAL | Status: DC

## 2012-11-08 LAB — PATHOLOGIST SMEAR REVIEW

## 2012-11-10 ENCOUNTER — Encounter (HOSPITAL_COMMUNITY): Payer: Self-pay | Admitting: Pharmacy Technician

## 2012-11-17 ENCOUNTER — Telehealth: Payer: Self-pay | Admitting: Internal Medicine

## 2012-11-18 ENCOUNTER — Other Ambulatory Visit: Payer: Self-pay | Admitting: Internal Medicine

## 2012-11-18 DIAGNOSIS — K7460 Unspecified cirrhosis of liver: Secondary | ICD-10-CM

## 2012-11-18 NOTE — Telephone Encounter (Signed)
Not clear what he's describing. Unlikely that the Meds are directly causing symptoms (maybe indirectly, but not clear). Have him seen by extender tomorrow. Get C-met, CBC, and Ammonia  Prior to visit.

## 2012-11-18 NOTE — Telephone Encounter (Signed)
Letter faxed. Pt aware and knows to come for labs and is scheduled for appt with Doug Sou PA 11/19/12@3 :30pm.

## 2012-11-18 NOTE — Telephone Encounter (Signed)
Spoke with [name] and he states that yesterday he thinks the fluid pills were causing him to hallucinate. States that today he is much better. States his weight has gone from 229 to 209, [name] was started on lasix 20mg  daily and aldactone 100mg  daily 11/04/12. [name] is on the road working today and [place]'t [place] around 2pm. Discussed with [name] that we may need some labs, states he could come tomorrow afternoon if needed. Dr. Marina Goodell please advise.

## 2012-11-18 NOTE — Telephone Encounter (Signed)
Wonders if we can fax a note to his employer [employer] stating he called [place] yesterday about his problem with hallucinations. Fax# [phone]. Attention Jamie.

## 2012-11-18 NOTE — Telephone Encounter (Signed)
Left a message for patient to call back. 

## 2012-11-19 ENCOUNTER — Other Ambulatory Visit (INDEPENDENT_AMBULATORY_CARE_PROVIDER_SITE_OTHER): Payer: Managed Care, Other (non HMO)

## 2012-11-19 ENCOUNTER — Ambulatory Visit (INDEPENDENT_AMBULATORY_CARE_PROVIDER_SITE_OTHER): Payer: Managed Care, Other (non HMO) | Admitting: Gastroenterology

## 2012-11-19 ENCOUNTER — Encounter: Payer: Self-pay | Admitting: Gastroenterology

## 2012-11-19 VITALS — BP 120/76 | HR 87 | Ht 70.0 in | Wt 214.5 lb

## 2012-11-19 DIAGNOSIS — K7460 Unspecified cirrhosis of liver: Secondary | ICD-10-CM

## 2012-11-19 DIAGNOSIS — R188 Other ascites: Secondary | ICD-10-CM

## 2012-11-19 LAB — CBC WITH DIFFERENTIAL/PLATELET
Basophils Absolute: 0 10*3/uL (ref 0.0–0.1)
Eosinophils Absolute: 0.1 10*3/uL (ref 0.0–0.7)
HCT: 28.5 % — ABNORMAL LOW (ref 39.0–52.0)
Lymphs Abs: 1.3 10*3/uL (ref 0.7–4.0)
MCHC: 33.7 g/dL (ref 30.0–36.0)
Monocytes Absolute: 0.4 10*3/uL (ref 0.1–1.0)
Monocytes Relative: 10.1 % (ref 3.0–12.0)
Platelets: 78 10*3/uL — ABNORMAL LOW (ref 150.0–400.0)
RDW: 19.1 % — ABNORMAL HIGH (ref 11.5–14.6)

## 2012-11-19 LAB — COMPREHENSIVE METABOLIC PANEL
ALT: 26 U/L (ref 0–53)
AST: 25 U/L (ref 0–37)
Alkaline Phosphatase: 45 U/L (ref 39–117)
CO2: 25 mEq/L (ref 19–32)
Creatinine, Ser: 0.8 mg/dL (ref 0.4–1.5)
Sodium: 135 mEq/L (ref 135–145)
Total Bilirubin: 1.1 mg/dL (ref 0.3–1.2)
Total Protein: 6.8 g/dL (ref 6.0–8.3)

## 2012-11-19 LAB — BASIC METABOLIC PANEL
BUN: 11 mg/dL (ref 6–23)
CO2: 25 mEq/L (ref 19–32)
GFR: 111.12 mL/min (ref >60.00)
Glucose, Bld: 235 mg/dL — ABNORMAL HIGH (ref 70–99)
Potassium: 4.3 mEq/L (ref 3.5–5.1)

## 2012-11-19 NOTE — Progress Notes (Signed)
11/19/2012 [name] [id number] [birth year]   History of Present Illness:  This is a 62 year old male who is a patient of Dr. Lamar Sprinkles.  Has cirrhosis from NASH.  Has had variceal bleeds and banding in the past, most recently in April 2014 and is scheduled for repeat EGD with Dr. Marina Goodell this next Monday to recheck on his varices.  Called in recently complaining of distended/tight abdomen.  Had ultrasound with ascites and paracentesis with 1.6 liters of fluid removed.  Was started on lasix 20 mg daily and aldactone 100 mg daily on 5/1.  He called in this week saying that he thought he was having hallucinations from the medications.  Was told to stop the medications and have labs (CBC, CMP, and ammonia) drawn before his office visit today.  Patient states that he did not have time to have the labs drawn prior to his visit but that he will get them done when he leaves our office.  Says that yesterday and today he felt better without taking the diuretics.  We went over the dosing and he was actually taking two lasix daily (40 mg instead of 20 mg).  Says that his weight is down almost 20 pounds since prior to taking the medications.  He is due to be seen back at [facility] in June as well.  Current Medications, Allergies, Past Medical History, Past Surgical History, Family History and Social History were reviewed in [employer] record.   Physical Exam: BP 120/76  Pulse 87  Ht 5\' 10"  (1.778 m)  Wt 214 lb 8 oz (97.297 kg)  BMI 30.78 kg/m2 General: Well developed, white male in no acute distress Head: Normocephalic and atraumatic Eyes:  sclerae anicteric, conjunctiva pink  Ears: Normal auditory acuity Lungs: Clear throughout to auscultation Heart: Regular rate and rhythm Abdomen: Soft, non-tender and non distended. No masses, no hepatomegaly. Normal bowel sounds. Musculoskeletal: Symmetrical with no gross deformities  Extremities: No edema  Neurological: Alert oriented x 4,  grossly nonfocal Psychological:  Alert and cooperative. Normal mood and affect  Assessment and Recommendations: #1. Hepatic cirrhosis, presumably secondary NASH, with portal hypertension.  Follows here and at the [facility] clinic.  Some ascites and recent paracentesis with 1.6 Liters drawn off and recent diuretics started. #2. GERD. Asymptomatic on PPI. #3. History of bleeding esophageal varices requiring band ligation. Last upper endoscopy April 2014 for acute bleed.  Is on propanolol. #4. History of adenomatous colon polyps.  Last colonoscopy 03/2012.  Call in 03/2015. #5. Diabetes mellitus on oral agents  *EGD on Monday already scheduled with Dr. Marina Goodell.   *To have labs drawn when he leaves here (CBC, CMP, and ammonia). *Will restart his diuretics at the correct doses of lasix 20 mg and aldactone 100 mg for now.

## 2012-11-19 NOTE — Patient Instructions (Addendum)
Please go to the basement level to have your labs drawn.  Restart your medications as directed.  Your Endoscopy is scheduled on Monday, 11-22-2012,  at [facility], 1st floor. Follow your directions for arrival time.

## 2012-11-20 NOTE — Progress Notes (Signed)
Agree with initial assessment and plans. I'll see him Monday

## 2012-11-22 ENCOUNTER — Encounter (HOSPITAL_COMMUNITY): Payer: Self-pay

## 2012-11-22 ENCOUNTER — Ambulatory Visit (HOSPITAL_COMMUNITY): Payer: Managed Care, Other (non HMO) | Admitting: Anesthesiology

## 2012-11-22 ENCOUNTER — Encounter (HOSPITAL_COMMUNITY)
Admission: RE | Disposition: A | Discharge status: Home / Self Care | Payer: Self-pay | Source: Ambulatory Visit | Attending: Internal Medicine

## 2012-11-22 ENCOUNTER — Ambulatory Visit (HOSPITAL_COMMUNITY)
Admission: RE | Admit: 2012-11-22 | Discharge: 2012-11-22 | Disposition: A | Discharge status: Home / Self Care | Payer: Managed Care, Other (non HMO) | Source: Ambulatory Visit | Attending: Internal Medicine | Admitting: Internal Medicine

## 2012-11-22 ENCOUNTER — Encounter (HOSPITAL_COMMUNITY): Payer: Self-pay | Admitting: Anesthesiology

## 2012-11-22 DIAGNOSIS — K222 Esophageal obstruction: Secondary | ICD-10-CM

## 2012-11-22 DIAGNOSIS — N289 Disorder of kidney and ureter, unspecified: Secondary | ICD-10-CM | POA: Insufficient documentation

## 2012-11-22 DIAGNOSIS — E669 Obesity, unspecified: Secondary | ICD-10-CM | POA: Insufficient documentation

## 2012-11-22 DIAGNOSIS — K7460 Unspecified cirrhosis of liver: Secondary | ICD-10-CM | POA: Insufficient documentation

## 2012-11-22 DIAGNOSIS — K319 Disease of stomach and duodenum, unspecified: Secondary | ICD-10-CM | POA: Insufficient documentation

## 2012-11-22 DIAGNOSIS — I10 Essential (primary) hypertension: Secondary | ICD-10-CM | POA: Insufficient documentation

## 2012-11-22 DIAGNOSIS — K219 Gastro-esophageal reflux disease without esophagitis: Secondary | ICD-10-CM | POA: Insufficient documentation

## 2012-11-22 DIAGNOSIS — E119 Type 2 diabetes mellitus without complications: Secondary | ICD-10-CM | POA: Insufficient documentation

## 2012-11-22 DIAGNOSIS — I8500 Esophageal varices without bleeding: Secondary | ICD-10-CM

## 2012-11-22 HISTORY — PX: ESOPHAGOGASTRODUODENOSCOPY: SHX5428

## 2012-11-22 SURGERY — EGD (ESOPHAGOGASTRODUODENOSCOPY)
Anesthesia: Monitor Anesthesia Care

## 2012-11-22 MED ORDER — PROPOFOL INFUSION 10 MG/ML OPTIME
INTRAVENOUS | Status: DC | PRN
  Administered 2012-11-22: 140 ug/kg/min via INTRAVENOUS

## 2012-11-22 MED ORDER — LACTATED RINGERS IV SOLN
INTRAVENOUS | Status: DC
  Administered 2012-11-22 (×2): via INTRAVENOUS

## 2012-11-22 MED ORDER — SODIUM CHLORIDE 0.9 % IV SOLN: INTRAVENOUS | Status: DC

## 2012-11-22 MED ORDER — KETAMINE HCL 10 MG/ML IJ SOLN
INTRAMUSCULAR | Status: DC | PRN
  Administered 2012-11-22: 20 mg via INTRAVENOUS
  Administered 2012-11-22: 10 mg via INTRAVENOUS

## 2012-11-22 MED ORDER — MIDAZOLAM HCL 5 MG/5ML IJ SOLN
INTRAMUSCULAR | Status: DC | PRN
  Administered 2012-11-22: 2 mg via INTRAVENOUS

## 2012-11-22 MED ORDER — BUTAMBEN-TETRACAINE-BENZOCAINE 2-2-14 % EX AERO
INHALATION_SPRAY | CUTANEOUS | Status: DC | PRN
  Administered 2012-11-22: 2 via TOPICAL

## 2012-11-22 NOTE — Anesthesia Postprocedure Evaluation (Signed)
  Anesthesia Post-op Note  Patient: [name]  Procedure(s) Performed: Procedure(s) (LRB): ESOPHAGOGASTRODUODENOSCOPY (EGD) (N/A) ESOPHAGEAL BANDING (N/A)  Patient Location: PACU  Anesthesia Type: MAC  Level of Consciousness: awake and alert   Airway and Oxygen Therapy: Patient Spontanous Breathing  Post-op Pain: mild  Post-op Assessment: Post-op Vital signs reviewed, Patient's Cardiovascular Status Stable, Respiratory Function Stable, Patent Airway and No signs of Nausea or vomiting  Last Vitals:  Filed Vitals:   11/22/12 1246  BP: 144/81  Pulse:   Temp:   Resp: 16    Post-op Vital Signs: stable   Complications: No apparent anesthesia complications

## 2012-11-22 NOTE — Op Note (Signed)
[facility] [address] [place], [address]   ENDOSCOPY PROCEDURE REPORT  PATIENT: Jonathan, Burch  MR#: [id number] BIRTHDATE: 1950-10-06 , 62  yrs. old GENDER: Male ENDOSCOPIST: Roxy Cedar, MD REFERRED BY:  .  [facility] PROCEDURE DATE:  11/22/2012 PROCEDURE:  EGD, diagnostic ASA CLASS:     Class III INDICATIONS:  Therapeutic procedure, possible band ligation of varices. Variceal bleed 4 weeks ago with band ligation therapy x2.  MEDICATIONS: MAC sedation, administered by CRNA and See Anesthesia Report. TOPICAL ANESTHETIC: Cetacaine Spray  DESCRIPTION OF PROCEDURE: After the risks benefits and alternatives of the procedure were thoroughly explained, informed consent was obtained.  The Pentax Gastroscope [id number] endoscope was introduced through the mouth and advanced to the second portion of the duodenum. Without limitations.  The instrument was slowly withdrawn as the mucosa was fully examined.      EXAM:The esophagus revealed scarring from prior examination therapy. 2 areas of stricturing or evidence.  No obvious bandable varices or high-risk stigmata.  The stomach revealed mild portal gastropathy the duodenal bulb and post bulbar duodenum were normal. Retroflexed views revealed no abnormalities.     The scope was then withdrawn from the patient and the procedure completed.  COMPLICATIONS: There were no complications. ENDOSCOPIC IMPRESSION: 1.The esophagus revealed scarring and stricturing from prior examination therapy.  No obvious bandable varices or high-risk stigmata. 2. Mild portal gastropathy  RECOMMENDATIONS: 1.Continue current medications 2. Keep appointment at [facility] next month 3. GI office followup with Dr. Marina Goodell in 3 months. 4. Relook endoscopy in 6 months  REPEAT EXAM:  eSigned:  Roxy Cedar, MD 11/22/2012 12:20 PM  CC:The Patient and Robb Matar, MD

## 2012-11-22 NOTE — Anesthesia Preprocedure Evaluation (Addendum)
Anesthesia Evaluation  Patient identified by MRN, date of birth, ID band Patient awake    Reviewed: Allergy & Precautions, H&P , NPO status , Patient's Chart, lab work & pertinent test results  Airway Mallampati: II TM Distance: >3 FB Neck ROM: Full    Dental no notable dental hx.    Pulmonary neg pulmonary ROS,  breath sounds clear to auscultation  Pulmonary exam normal       Cardiovascular Exercise Tolerance: Good hypertension, Pt. on medications and Pt. on home beta blockers negative cardio ROS  Rhythm:Regular Rate:Normal + Systolic murmurs    Neuro/Psych negative neurological ROS  negative psych ROS   GI/Hepatic GERD-  Medicated,(+) Cirrhosis -  Esophageal Varices     ,   Endo/Other  diabetes, Type 2, Oral Hypoglycemic Agents and Insulin Dependent  Renal/GU Renal disease  negative genitourinary   Musculoskeletal negative musculoskeletal ROS (+)   Abdominal (+) + obese,   Peds negative pediatric ROS (+)  Hematology HGB 9.6   Anesthesia Other Findings   Reproductive/Obstetrics negative OB ROS                         Anesthesia Physical Anesthesia Plan  ASA: III  Anesthesia Plan: MAC   Post-op Pain Management:    Induction: Intravenous  Airway Management Planned:   Additional Equipment:   Intra-op Plan:   Post-operative Plan:   Informed Consent: I have reviewed the patients History and Physical, chart, labs and discussed the procedure including the risks, benefits and alternatives for the proposed anesthesia with the patient or authorized representative who has indicated his/her understanding and acceptance.   Dental advisory given  Plan Discussed with: CRNA  Anesthesia Plan Comments:         Anesthesia Quick Evaluation

## 2012-11-22 NOTE — H&P (View-Only) (Signed)
Agree with initial assessment and plans. I'll see him Monday 

## 2012-11-22 NOTE — Interval H&P Note (Signed)
History and Physical Interval Note: No problems since office visit. No asterixis on exam. Not sure what he was describing as hallucinations". Labs ok . Mild elevation of ammonia noted. No clinical evidence for hepatic encephalopathy. Plans for EGD possible banding today. The nature of the procedure, as well as the risks, benefits, and alternatives were carefully and thoroughly reviewed with the patient. Ample time for discussion and questions allowed. The patient understood, was satisfied, and agreed to proceed.  Wilhemina Bonito. Eda Keys., M.D. [facility] of Gastroenterology  11/22/2012 11:50 AM  [name]  has presented today for surgery, with the diagnosis of Esophageal varices [456.1]  The various methods of treatment have been discussed with the patient and family. After consideration of risks, benefits and other options for treatment, the patient has consented to  Procedure(s): ESOPHAGOGASTRODUODENOSCOPY (EGD) (N/A) ESOPHAGEAL BANDING (N/A) as a surgical intervention .  The patient's history has been reviewed, patient examined, no change in status, stable for surgery.  I have reviewed the patient's chart and labs.  Questions were answered to the patient's satisfaction.     Yancey Flemings

## 2012-11-22 NOTE — Transfer of Care (Signed)
Immediate Anesthesia Transfer of Care Note  Patient: [name]  Procedure(s) Performed: Procedure(s): ESOPHAGOGASTRODUODENOSCOPY (EGD) (N/A) ESOPHAGEAL BANDING (N/A) ` Patient Location: PACU and Endoscopy Unit  Anesthesia Type:MAC  Level of Consciousness: awake and alert   Airway & Oxygen Therapy: Patient Spontanous Breathing and Patient connected to nasal cannula oxygen  Post-op Assessment: Report given to PACU RN and Post -op Vital signs reviewed and stable  Post vital signs: Reviewed and stable  Complications: No apparent anesthesia complications

## 2012-11-24 ENCOUNTER — Encounter (HOSPITAL_COMMUNITY): Payer: Self-pay | Admitting: Internal Medicine

## 2013-02-02 ENCOUNTER — Other Ambulatory Visit: Payer: Self-pay | Admitting: *Deleted

## 2013-02-02 DIAGNOSIS — K7689 Other specified diseases of liver: Secondary | ICD-10-CM

## 2013-02-02 DIAGNOSIS — K219 Gastro-esophageal reflux disease without esophagitis: Secondary | ICD-10-CM

## 2013-02-02 DIAGNOSIS — I8500 Esophageal varices without bleeding: Secondary | ICD-10-CM

## 2013-02-02 MED ORDER — PANTOPRAZOLE SODIUM 40 MG PO TBEC: 40.0000 mg | DELAYED_RELEASE_TABLET | Freq: Two times a day (BID) | ORAL | Status: DC

## 2013-02-03 ENCOUNTER — Other Ambulatory Visit: Payer: Self-pay | Admitting: Endocrinology

## 2013-02-04 ENCOUNTER — Other Ambulatory Visit: Payer: Self-pay | Admitting: *Deleted

## 2013-02-04 MED ORDER — INSULIN ASPART 100 UNIT/ML FLEXPEN: 5.0000 [IU] | PEN_INJECTOR | Freq: Three times a day (TID) | SUBCUTANEOUS | Status: DC

## 2013-02-16 ENCOUNTER — Other Ambulatory Visit: Payer: Self-pay | Admitting: Endocrinology

## 2013-02-23 ENCOUNTER — Other Ambulatory Visit (INDEPENDENT_AMBULATORY_CARE_PROVIDER_SITE_OTHER): Payer: Managed Care, Other (non HMO)

## 2013-02-23 ENCOUNTER — Ambulatory Visit (INDEPENDENT_AMBULATORY_CARE_PROVIDER_SITE_OTHER): Payer: Managed Care, Other (non HMO) | Admitting: Internal Medicine

## 2013-02-23 ENCOUNTER — Encounter: Payer: Self-pay | Admitting: Internal Medicine

## 2013-02-23 ENCOUNTER — Other Ambulatory Visit: Payer: Self-pay | Admitting: Internal Medicine

## 2013-02-23 VITALS — BP 110/64 | HR 50 | Ht 71.0 in | Wt 221.8 lb

## 2013-02-23 DIAGNOSIS — Z86010 Personal history of colonic polyps: Secondary | ICD-10-CM

## 2013-02-23 DIAGNOSIS — I8500 Esophageal varices without bleeding: Secondary | ICD-10-CM

## 2013-02-23 DIAGNOSIS — K7460 Unspecified cirrhosis of liver: Secondary | ICD-10-CM

## 2013-02-23 DIAGNOSIS — R188 Other ascites: Secondary | ICD-10-CM

## 2013-02-23 LAB — BASIC METABOLIC PANEL
Calcium: 9.5 mg/dL (ref 8.4–10.5)
Creatinine, Ser: 0.8 mg/dL (ref 0.4–1.5)
GFR: 109.36 mL/min (ref >60.00)

## 2013-02-23 LAB — PROTIME-INR: Prothrombin Time: 15.7 s — ABNORMAL HIGH (ref 10.2–12.4)

## 2013-02-23 LAB — HEPATIC FUNCTION PANEL
AST: 24 U/L (ref 0–37)
Albumin: 3.5 g/dL (ref 3.5–5.2)
Alkaline Phosphatase: 41 U/L (ref 39–117)
Total Protein: 7.1 g/dL (ref 6.0–8.3)

## 2013-02-23 LAB — CBC WITH DIFFERENTIAL/PLATELET
Basophils Relative: 0.3 % (ref 0.0–3.0)
Eosinophils Absolute: 0.1 10*3/uL (ref 0.0–0.7)
Eosinophils Relative: 3.4 % (ref 0.0–5.0)
Lymphocytes Relative: 22.8 % (ref 12.0–46.0)
Monocytes Relative: 14.8 % — ABNORMAL HIGH (ref 3.0–12.0)
Neutrophils Relative %: 58.7 % (ref 43.0–77.0)
RBC: 4.32 Mil/uL (ref 4.22–5.81)
WBC: 3.4 10*3/uL — ABNORMAL LOW (ref 4.5–10.5)

## 2013-02-23 NOTE — Progress Notes (Signed)
HISTORY OF PRESENT ILLNESS:  [name] is a 62 y.o. male with multiple medical problems as listed below. 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 Complications of his liver disease are related to portal hypertension and have included variceal bleeding as well as ascites. He was last seen 11/22/2012 when he underwent surveillance upper endoscopy. No obvious varices to be banded. He has had no interval problems. No GI bleeding, encephalopathy, or issues with edema. He takes Aldactone 100 mg daily and furosemide 20 mg daily. Medication list states that he is on potassium chloride 20 mEq daily, though he is uncertain. He has not had blood work since May. He is due to be seen at the [facility] liver clinic next month.  REVIEW OF SYSTEMS:  All non-GI ROS negative    Past Medical History  Diagnosis Date  . Reflux esophagitis   . Portal hypertension   . Stricture and stenosis of esophagus   . Diverticulosis of colon (without mention of hemorrhage)   . Internal hemorrhoids without mention of complication   . Benign neoplasm of colon   . Duodenitis without mention of hemorrhage   . 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 Personal history of diseases of blood and blood-forming organs   . Cirrhosis of liver without mention of alcohol   . Unspecified essential hypertension   . Obesity, unspecified   . Type II or unspecified type diabetes mellitus without mention of complication, not stated as uncontrolled   . GERD (gastroesophageal reflux disease)   . Hemorrhoids   . Colon polyps   . Calculus of kidney   . Pancytopenia     Past Surgical History  Procedure Laterality Date  . Kidney stone surgery  yrs ago  . Esophagogastroduodenoscopy  08/01/2011    Procedure: ESOPHAGOGASTRODUODENOSCOPY (EGD);  Surgeon: Yancey Flemings, MD;  Location: [facility];  Service: Endoscopy;  Laterality: N/A;  . Esophagogastroduodenoscopy N/A 10/27/2012    Procedure:  ESOPHAGOGASTRODUODENOSCOPY (EGD);  Surgeon: Louis Meckel, MD;  Location: [facility];  Service: Endoscopy;  Laterality: N/A;  . Esophagogastroduodenoscopy N/A 11/22/2012    Procedure: ESOPHAGOGASTRODUODENOSCOPY (EGD);  Surgeon: Hilarie Fredrickson, MD;  Location: [facility];  Service: Endoscopy;  Laterality: N/A;    Social History [name]  reports that he has never smoked. He has never used smokeless tobacco. He reports that he does not drink alcohol or use illicit drugs.  family history includes Aneurysm in his mother; Diabetes in his father; Heart disease in his father and other. There is no history of Colon cancer, Anesthesia problems, Hypotension, Malignant hyperthermia, or Pseudochol deficiency.  No Known Allergies     PHYSICAL EXAMINATION: Vital signs: BP 110/64  Pulse 50  Ht 5\' 11"  (1.803 m)  Wt 221 lb 12.8 oz (100.608 kg)  BMI 30.95 kg/m2  SpO2 98%  Constitutional: generally well-appearing, no acute distress Psychiatric: alert and oriented x3, cooperative Eyes: extraocular movements intact, anicteric, conjunctiva pink Mouth: oral pharynx moist, no lesions Neck: supple no lymphadenopathy Cardiovascular: heart regular rate and rhythm, no murmur Lungs: clear to auscultation bilaterally Abdomen: soft, obese, nontender, nondistended, no obvious ascites, no peritoneal signs, normal bowel sounds, no organomegaly Extremities: no lower extremity edema bilaterally. He does wear compressive stockings Skin: no lesions on visible extremities Neuro: No focal deficits. No asterixis.    ASSESSMENT:  #1. Hepatic cirrhosis with portal hypertension presumably secondary NASH. Compensated #2. History of bleeding varices status post band ligation. No varices amenable to  banding May 2014 #3. GERD. Asymptomatic on PPI #4. History of adenomatous colon polyps. Last colonoscopy with large adenoma September 2013. #5. Diabetes. Stable #6. History of ascites. Currently on diuretics  without ascites   PLAN:  #1. Continue on current medications #2. Laboratories today including CBC, comprehensive metabolic panel, and prothrombin time #3. Relook upper endoscopy with possible banding in November or December #4. Keep followup appointment at [facility] next month #5. Routine office visit in 1 year. Interval followup as needed

## 2013-02-23 NOTE — Patient Instructions (Addendum)
Your physician has requested that you go to the basement for the following lab work before leaving today: Hepatic function, CBC, BMET   We will call you to schedule an egd with banding at the hospital in approximately 2 months

## 2013-02-24 ENCOUNTER — Other Ambulatory Visit: Payer: Self-pay | Admitting: Internal Medicine

## 2013-02-24 DIAGNOSIS — K7460 Unspecified cirrhosis of liver: Secondary | ICD-10-CM

## 2013-02-24 DIAGNOSIS — D649 Anemia, unspecified: Secondary | ICD-10-CM

## 2013-02-24 MED ORDER — FERROUS SULFATE 325 (65 FE) MG PO TABS: 325.0000 mg | ORAL_TABLET | Freq: Two times a day (BID) | ORAL | Status: DC

## 2013-03-14 ENCOUNTER — Other Ambulatory Visit: Payer: Self-pay | Admitting: Internal Medicine

## 2013-03-17 ENCOUNTER — Other Ambulatory Visit: Payer: Self-pay | Admitting: *Deleted

## 2013-03-17 ENCOUNTER — Other Ambulatory Visit: Payer: Self-pay | Admitting: Endocrinology

## 2013-03-17 NOTE — Telephone Encounter (Signed)
Please inform patient he needs to make followup visit at [phone]

## 2013-03-25 ENCOUNTER — Other Ambulatory Visit (INDEPENDENT_AMBULATORY_CARE_PROVIDER_SITE_OTHER): Payer: Managed Care, Other (non HMO)

## 2013-03-25 DIAGNOSIS — D649 Anemia, unspecified: Secondary | ICD-10-CM

## 2013-03-25 DIAGNOSIS — K7460 Unspecified cirrhosis of liver: Secondary | ICD-10-CM

## 2013-03-25 LAB — CBC WITH DIFFERENTIAL/PLATELET
Basophils Absolute: 0 10*3/uL (ref 0.0–0.1)
Basophils Relative: 0.5 % (ref 0.0–3.0)
Eosinophils Relative: 3.3 % (ref 0.0–5.0)
Lymphocytes Relative: 26.2 % (ref 12.0–46.0)
Lymphs Abs: 0.7 10*3/uL (ref 0.7–4.0)
MCV: 75.1 fl — ABNORMAL LOW (ref 78.0–100.0)
Monocytes Relative: 13.5 % — ABNORMAL HIGH (ref 3.0–12.0)
Neutro Abs: 1.6 10*3/uL (ref 1.4–7.7)

## 2013-03-25 LAB — BASIC METABOLIC PANEL
BUN: 8 mg/dL (ref 6–23)
CO2: 26 mEq/L (ref 19–32)
Chloride: 104 mEq/L (ref 96–112)
Creatinine, Ser: 0.7 mg/dL (ref 0.4–1.5)
Glucose, Bld: 282 mg/dL — ABNORMAL HIGH (ref 70–99)

## 2013-03-28 ENCOUNTER — Other Ambulatory Visit: Payer: Self-pay

## 2013-03-28 DIAGNOSIS — K7460 Unspecified cirrhosis of liver: Secondary | ICD-10-CM

## 2013-04-26 ENCOUNTER — Other Ambulatory Visit (INDEPENDENT_AMBULATORY_CARE_PROVIDER_SITE_OTHER): Payer: Managed Care, Other (non HMO)

## 2013-04-26 DIAGNOSIS — K7460 Unspecified cirrhosis of liver: Secondary | ICD-10-CM

## 2013-04-26 LAB — CBC WITH DIFFERENTIAL/PLATELET
Basophils Absolute: 0 10*3/uL (ref 0.0–0.1)
Basophils Relative: 0.3 % (ref 0.0–3.0)
Eosinophils Absolute: 0.1 10*3/uL (ref 0.0–0.7)
Eosinophils Relative: 2.6 % (ref 0.0–5.0)
HCT: 36.9 % — ABNORMAL LOW (ref 39.0–52.0)
Lymphs Abs: 0.9 10*3/uL (ref 0.7–4.0)
MCHC: 33.7 g/dL (ref 30.0–36.0)
MCV: 80.1 fl (ref 78.0–100.0)
Monocytes Absolute: 0.6 10*3/uL (ref 0.1–1.0)
Neutro Abs: 2.3 10*3/uL (ref 1.4–7.7)
RBC: 4.61 Mil/uL (ref 4.22–5.81)
WBC: 3.9 10*3/uL — ABNORMAL LOW (ref 4.5–10.5)

## 2013-04-26 LAB — BASIC METABOLIC PANEL
BUN: 13 mg/dL (ref 6–23)
Calcium: 9.1 mg/dL (ref 8.4–10.5)
Creatinine, Ser: 0.8 mg/dL (ref 0.4–1.5)
GFR: 109.3 mL/min (ref >60.00)
Glucose, Bld: 295 mg/dL — ABNORMAL HIGH (ref 70–99)

## 2013-04-27 ENCOUNTER — Other Ambulatory Visit: Payer: Self-pay

## 2013-04-27 DIAGNOSIS — K7460 Unspecified cirrhosis of liver: Secondary | ICD-10-CM

## 2013-05-12 ENCOUNTER — Other Ambulatory Visit: Payer: Self-pay

## 2013-05-21 ENCOUNTER — Other Ambulatory Visit: Payer: Self-pay | Admitting: Endocrinology

## 2013-05-27 ENCOUNTER — Other Ambulatory Visit (INDEPENDENT_AMBULATORY_CARE_PROVIDER_SITE_OTHER): Payer: Managed Care, Other (non HMO)

## 2013-05-27 ENCOUNTER — Other Ambulatory Visit: Payer: Self-pay | Admitting: Internal Medicine

## 2013-05-27 DIAGNOSIS — K7460 Unspecified cirrhosis of liver: Secondary | ICD-10-CM

## 2013-05-27 LAB — BASIC METABOLIC PANEL
CO2: 25 mEq/L (ref 19–32)
Calcium: 9.1 mg/dL (ref 8.4–10.5)
Creatinine, Ser: 0.7 mg/dL (ref 0.4–1.5)
GFR: 132.86 mL/min (ref >60.00)
Sodium: 134 mEq/L — ABNORMAL LOW (ref 135–145)

## 2013-06-18 ENCOUNTER — Other Ambulatory Visit: Payer: Self-pay | Admitting: Endocrinology

## 2013-07-01 ENCOUNTER — Other Ambulatory Visit (INDEPENDENT_AMBULATORY_CARE_PROVIDER_SITE_OTHER): Payer: Managed Care, Other (non HMO)

## 2013-07-01 DIAGNOSIS — K7460 Unspecified cirrhosis of liver: Secondary | ICD-10-CM

## 2013-07-01 LAB — BASIC METABOLIC PANEL
BUN: 9 mg/dL (ref 6–23)
Calcium: 9.3 mg/dL (ref 8.4–10.5)
GFR: 130.5 mL/min (ref >60.00)
Glucose, Bld: 189 mg/dL — ABNORMAL HIGH (ref 70–99)
Potassium: 3.6 mEq/L (ref 3.5–5.1)
Sodium: 138 mEq/L (ref 135–145)

## 2013-07-06 ENCOUNTER — Other Ambulatory Visit: Payer: Self-pay

## 2013-07-06 DIAGNOSIS — K7460 Unspecified cirrhosis of liver: Secondary | ICD-10-CM

## 2013-08-15 ENCOUNTER — Encounter: Payer: Self-pay | Admitting: Internal Medicine

## 2013-08-18 ENCOUNTER — Other Ambulatory Visit: Payer: Self-pay | Admitting: Endocrinology

## 2013-09-18 ENCOUNTER — Other Ambulatory Visit: Payer: Self-pay | Admitting: Endocrinology

## 2013-09-19 ENCOUNTER — Other Ambulatory Visit: Payer: Self-pay | Admitting: Endocrinology

## 2013-10-04 ENCOUNTER — Telehealth: Payer: Self-pay

## 2013-10-04 ENCOUNTER — Other Ambulatory Visit (INDEPENDENT_AMBULATORY_CARE_PROVIDER_SITE_OTHER): Payer: Managed Care, Other (non HMO)

## 2013-10-04 DIAGNOSIS — K7460 Unspecified cirrhosis of liver: Secondary | ICD-10-CM

## 2013-10-04 LAB — BASIC METABOLIC PANEL
BUN: 12 mg/dL (ref 6–23)
CO2: 25 mEq/L (ref 19–32)
Calcium: 9.2 mg/dL (ref 8.4–10.5)
Chloride: 103 mEq/L (ref 96–112)
Creatinine, Ser: 0.7 mg/dL (ref 0.4–1.5)
GFR: 117.93 mL/min (ref >60.00)
Glucose, Bld: 140 mg/dL — ABNORMAL HIGH (ref 70–99)
POTASSIUM: 4.2 meq/L (ref 3.5–5.1)
SODIUM: 136 meq/L (ref 135–145)

## 2013-10-04 NOTE — Telephone Encounter (Signed)
Pts states his family doctor drew a testosterone level and it was low. PCP would like to start pt on testosterone but states it could affect pts liver. Pt wants to know what [name] thinks about starting the testosterone. Please advise.

## 2013-10-09 NOTE — Telephone Encounter (Signed)
Prescribed in appropriate dose for necessary replacement should be ok.

## 2013-10-10 ENCOUNTER — Other Ambulatory Visit: Payer: Self-pay

## 2013-10-10 DIAGNOSIS — K7460 Unspecified cirrhosis of liver: Secondary | ICD-10-CM

## 2013-10-10 NOTE — Telephone Encounter (Signed)
Pt aware.

## 2013-10-18 ENCOUNTER — Other Ambulatory Visit: Payer: Self-pay | Admitting: Endocrinology

## 2013-10-24 ENCOUNTER — Encounter: Payer: Self-pay | Admitting: Internal Medicine

## 2013-10-28 ENCOUNTER — Telehealth: Payer: Self-pay | Admitting: Internal Medicine

## 2013-10-28 NOTE — Telephone Encounter (Signed)
error 

## 2013-10-28 NOTE — Telephone Encounter (Signed)
Scheduled patient for previsit for egd with banding 11/22/2013 at 2:30 and the procedure at the hospital on 11/29/2013 at 11:00am.  Called patient and gave him this information.  Patient acknowledged and understood

## 2013-11-09 ENCOUNTER — Encounter (HOSPITAL_COMMUNITY): Payer: Self-pay | Admitting: Pharmacy Technician

## 2013-11-17 ENCOUNTER — Encounter (HOSPITAL_COMMUNITY): Payer: Self-pay | Admitting: *Deleted

## 2013-11-22 ENCOUNTER — Ambulatory Visit (AMBULATORY_SURGERY_CENTER): Payer: Self-pay

## 2013-11-22 VITALS — Ht 70.0 in | Wt 198.0 lb

## 2013-11-22 DIAGNOSIS — I8500 Esophageal varices without bleeding: Secondary | ICD-10-CM

## 2013-11-22 NOTE — Progress Notes (Signed)
Per pt, no allergies to soy or egg products.Pt not taking any weight loss meds or using  O2 at home. 

## 2013-11-23 ENCOUNTER — Other Ambulatory Visit: Payer: Self-pay

## 2013-11-23 DIAGNOSIS — I8500 Esophageal varices without bleeding: Secondary | ICD-10-CM

## 2013-11-29 ENCOUNTER — Ambulatory Visit (HOSPITAL_COMMUNITY)
Admission: RE | Admit: 2013-11-29 | Discharge: 2013-11-29 | Disposition: A | Discharge status: Home / Self Care | Payer: Managed Care, Other (non HMO) | Source: Ambulatory Visit | Attending: Internal Medicine | Admitting: Internal Medicine

## 2013-11-29 ENCOUNTER — Encounter (HOSPITAL_COMMUNITY): Payer: Self-pay | Admitting: Anesthesiology

## 2013-11-29 ENCOUNTER — Encounter (HOSPITAL_COMMUNITY): Payer: Managed Care, Other (non HMO) | Admitting: Anesthesiology

## 2013-11-29 ENCOUNTER — Encounter (HOSPITAL_COMMUNITY)
Admission: RE | Disposition: A | Discharge status: Home / Self Care | Payer: Self-pay | Source: Ambulatory Visit | Attending: Internal Medicine

## 2013-11-29 ENCOUNTER — Ambulatory Visit (HOSPITAL_COMMUNITY): Payer: Managed Care, Other (non HMO) | Admitting: Anesthesiology

## 2013-11-29 DIAGNOSIS — K219 Gastro-esophageal reflux disease without esophagitis: Secondary | ICD-10-CM | POA: Insufficient documentation

## 2013-11-29 DIAGNOSIS — I8510 Secondary esophageal varices without bleeding: Secondary | ICD-10-CM | POA: Insufficient documentation

## 2013-11-29 DIAGNOSIS — K319 Disease of stomach and duodenum, unspecified: Secondary | ICD-10-CM | POA: Insufficient documentation

## 2013-11-29 DIAGNOSIS — Z87442 Personal history of urinary calculi: Secondary | ICD-10-CM | POA: Insufficient documentation

## 2013-11-29 DIAGNOSIS — K766 Portal hypertension: Secondary | ICD-10-CM | POA: Insufficient documentation

## 2013-11-29 DIAGNOSIS — Z860100 Personal history of colon polyps, unspecified: Secondary | ICD-10-CM | POA: Insufficient documentation

## 2013-11-29 DIAGNOSIS — K5730 Diverticulosis of large intestine without perforation or abscess without bleeding: Secondary | ICD-10-CM | POA: Insufficient documentation

## 2013-11-29 DIAGNOSIS — I10 Essential (primary) hypertension: Secondary | ICD-10-CM | POA: Insufficient documentation

## 2013-11-29 DIAGNOSIS — Z79899 Other long term (current) drug therapy: Secondary | ICD-10-CM | POA: Insufficient documentation

## 2013-11-29 DIAGNOSIS — K7460 Unspecified cirrhosis of liver: Secondary | ICD-10-CM | POA: Insufficient documentation

## 2013-11-29 DIAGNOSIS — I8500 Esophageal varices without bleeding: Secondary | ICD-10-CM

## 2013-11-29 DIAGNOSIS — D649 Anemia, unspecified: Secondary | ICD-10-CM | POA: Insufficient documentation

## 2013-11-29 DIAGNOSIS — E119 Type 2 diabetes mellitus without complications: Secondary | ICD-10-CM | POA: Insufficient documentation

## 2013-11-29 HISTORY — PX: ESOPHAGOGASTRODUODENOSCOPY: SHX5428

## 2013-11-29 HISTORY — PX: ESOPHAGEAL BANDING: SHX5518

## 2013-11-29 LAB — GLUCOSE, CAPILLARY: GLUCOSE-CAPILLARY: 149 mg/dL — AB (ref 70–99)

## 2013-11-29 LAB — HEMOGLOBIN AND HEMATOCRIT, BLOOD
HCT: 43.6 % (ref 39.0–52.0)
HEMOGLOBIN: 15.2 g/dL (ref 13.0–17.0)

## 2013-11-29 SURGERY — EGD (ESOPHAGOGASTRODUODENOSCOPY)
Anesthesia: Monitor Anesthesia Care

## 2013-11-29 MED ORDER — LACTATED RINGERS IV SOLN
INTRAVENOUS | Status: DC | PRN
  Administered 2013-11-29: 10:00:00 via INTRAVENOUS

## 2013-11-29 MED ORDER — LACTATED RINGERS IV SOLN
INTRAVENOUS | Status: DC
  Administered 2013-11-29: 1000 mL via INTRAVENOUS

## 2013-11-29 MED ORDER — PROPOFOL 10 MG/ML IV BOLUS
INTRAVENOUS | Status: AC
  Filled 2013-11-29: qty 20

## 2013-11-29 MED ORDER — LIDOCAINE HCL (CARDIAC) 20 MG/ML IV SOLN
INTRAVENOUS | Status: AC
  Filled 2013-11-29: qty 5

## 2013-11-29 MED ORDER — KETAMINE HCL 10 MG/ML IJ SOLN
INTRAMUSCULAR | Status: DC | PRN
  Administered 2013-11-29: 25 mg via INTRAVENOUS

## 2013-11-29 MED ORDER — PROPOFOL INFUSION 10 MG/ML OPTIME
INTRAVENOUS | Status: DC | PRN
  Administered 2013-11-29: 100 ug/kg/min via INTRAVENOUS

## 2013-11-29 MED ORDER — KETAMINE HCL 10 MG/ML IJ SOLN
INTRAMUSCULAR | Status: AC
  Filled 2013-11-29: qty 1

## 2013-11-29 MED ORDER — BUTAMBEN-TETRACAINE-BENZOCAINE 2-2-14 % EX AERO
INHALATION_SPRAY | CUTANEOUS | Status: DC | PRN
  Administered 2013-11-29: 2 via TOPICAL

## 2013-11-29 MED ORDER — SODIUM CHLORIDE 0.9 % IV SOLN: INTRAVENOUS | Status: DC

## 2013-11-29 NOTE — Transfer of Care (Signed)
Immediate Anesthesia Transfer of Care Note  Patient: [name]  Procedure(s) Performed: Procedure(s): ESOPHAGOGASTRODUODENOSCOPY (EGD) (N/A) ESOPHAGEAL BANDING (Bilateral)  Patient Location: PACU  Anesthesia Type:MAC  Level of Consciousness: awake, alert  and oriented  Airway & Oxygen Therapy: Patient Spontanous Breathing and Patient connected to nasal cannula oxygen  Post-op Assessment: Report given to PACU RN and Post -op Vital signs reviewed and stable  Post vital signs: Reviewed and stable  Complications: No apparent anesthesia complications

## 2013-11-29 NOTE — Discharge Instructions (Signed)
Gastrointestinal Endoscopy °Care After °Refer to this sheet in the next few weeks. These instructions provide you with information on caring for yourself after your procedure. Your caregiver may also give you more specific instructions. Your treatment has been planned according to current medical practices, but problems sometimes occur. Call your caregiver if you have any problems or questions after your procedure. °HOME CARE INSTRUCTIONS °· If you were given medicine to help you relax (sedative), do not drive, operate machinery, or sign important documents for 24 hours. °· Avoid alcohol and hot or warm beverages for the first 24 hours after the procedure. °· Only take over-the-counter or prescription medicines for pain, discomfort, or fever as directed by your caregiver. You may resume taking your normal medicines unless your caregiver tells you otherwise. Ask your caregiver when you may resume taking medicines that may cause bleeding, such as aspirin, clopidogrel, or warfarin. °· You may return to your normal diet and activities on the day after your procedure, or as directed by your caregiver. Walking may help to reduce any bloated feeling in your abdomen. °· Drink enough fluids to keep your urine clear or pale yellow. °· You may gargle with salt water if you have a sore throat. °SEEK IMMEDIATE MEDICAL CARE IF: °· You have severe nausea or vomiting. °· You have severe abdominal pain, abdominal cramps that last longer than 6 hours, or abdominal swelling (distention). °· You have severe shoulder or back pain. °· You have trouble swallowing. °· You have shortness of breath, your breathing is shallow, or you are breathing faster than normal. °· You have a fever or a rapid heartbeat. °· You vomit blood or material that looks like coffee grounds. °· You have bloody, black, or tarry stools. °MAKE SURE YOU: °· Understand these instructions. °· Will watch your condition. °· Will get help right away if you are not doing  well or get worse. °Document Released: 02/05/2004 Document Revised: 12/23/2011 Document Reviewed: 09/23/2011 °ExitCare® Patient Information ©2014 [employer]. ° °

## 2013-11-29 NOTE — Anesthesia Preprocedure Evaluation (Addendum)
Anesthesia Evaluation  Patient identified by MRN, date of birth, ID band Patient awake    Reviewed: Allergy & Precautions, H&P , NPO status , Patient's Chart, lab work & pertinent test results  Airway Mallampati: II TM Distance: >3 FB Neck ROM: Full    Dental no notable dental hx.    Pulmonary neg pulmonary ROS,  breath sounds clear to auscultation  Pulmonary exam normal       Cardiovascular hypertension, Pt. on medications and Pt. on home beta blockers Rhythm:Regular Rate:Normal     Neuro/Psych negative neurological ROS  negative psych ROS   GI/Hepatic GERD-  Medicated,(+) Cirrhosis -  Esophageal Varices    ,   Endo/Other  diabetes, Type 2, Oral Hypoglycemic Agents  Renal/GU Renal diseasenegative Renal ROS  negative genitourinary   Musculoskeletal negative musculoskeletal ROS (+)   Abdominal   Peds negative pediatric ROS (+)  Hematology  (+) anemia ,   Anesthesia Other Findings   Reproductive/Obstetrics negative OB ROS                          Anesthesia Physical Anesthesia Plan  ASA: III  Anesthesia Plan: MAC   Post-op Pain Management:    Induction: Intravenous  Airway Management Planned:   Additional Equipment:   Intra-op Plan:   Post-operative Plan:   Informed Consent: I have reviewed the patients History and Physical, chart, labs and discussed the procedure including the risks, benefits and alternatives for the proposed anesthesia with the patient or authorized representative who has indicated his/her understanding and acceptance.   Dental advisory given  Plan Discussed with: CRNA  Anesthesia Plan Comments:         Anesthesia Quick Evaluation

## 2013-11-29 NOTE — H&P (Signed)
  HISTORY OF PRESENT ILLNESS:  [name] is a 63 y.o. male with hepatic cirrhosis and portal hypertension. History of variceal bleed. Presents today for surveillance endoscopy. Remains on beta blocker. No interval problems since his last evaluation. He is accompanied by his wife  REVIEW OF SYSTEMS:  All non-GI ROS negative   Past Medical History  Diagnosis Date  . Reflux esophagitis   . Portal hypertension   . Stricture and stenosis of esophagus   . Diverticulosis of colon (without mention of hemorrhage)   . Internal hemorrhoids without mention of complication   . Benign neoplasm of colon   . Duodenitis without mention of hemorrhage   . 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 Personal history of diseases of blood and blood-forming organs   . Cirrhosis of liver without mention of alcohol   . Unspecified essential hypertension   . Obesity, unspecified   . Type II or unspecified type diabetes mellitus without mention of complication, not stated as uncontrolled   . GERD (gastroesophageal reflux disease)   . Hemorrhoids   . Colon polyps   . Pancytopenia   . Calculus of kidney     Past Surgical History  Procedure Laterality Date  . Kidney stone surgery  yrs ago  . Esophagogastroduodenoscopy  08/01/2011    Procedure: ESOPHAGOGASTRODUODENOSCOPY (EGD);  Surgeon: Scarlette Shorts, MD;  Location: [facility];  Service: Endoscopy;  Laterality: N/A;  . Esophagogastroduodenoscopy N/A 10/27/2012    Procedure: ESOPHAGOGASTRODUODENOSCOPY (EGD);  Surgeon: Inda Castle, MD;  Location: [facility];  Service: Endoscopy;  Laterality: N/A;  . Esophagogastroduodenoscopy N/A 11/22/2012    Procedure: ESOPHAGOGASTRODUODENOSCOPY (EGD);  Surgeon: Irene Shipper, MD;  Location: [facility];  Service: Endoscopy;  Laterality: N/A;  . Colonoscopy    . Upper gastrointestinal endoscopy      Social History [name]  reports that he has never smoked. He has never used smokeless  tobacco. He reports that he drinks alcohol. He reports that he does not use illicit drugs.  family history includes Aneurysm in his mother; Diabetes in his father; Heart disease in his father and other. There is no history of Colon cancer, Anesthesia problems, Hypotension, Malignant hyperthermia, or Pseudochol deficiency.  No Known Allergies     PHYSICAL EXAMINATION: Vital signs: BP 126/82  Pulse 65  Resp 20  SpO2 98% General: Well-developed, well-nourished, no acute distress HEENT: Sclerae are anicteric, conjunctiva pink. Oral mucosa intact Lungs: Clear Heart: Regular Abdomen: soft, nontender, nondistended, no obvious ascites, no peritoneal signs, normal bowel sounds. No organomegaly. Extremities: No edema Psychiatric: alert and oriented x3. Cooperative    ASSESSMENT:  #1. Hepatic cirrhosis with portal hypertension. History of variceal bleed. Clinically stable     PLAN:   #1. Upper endoscopy with possible band ligation therapy.The nature of the procedure, as well as the risks, benefits, and alternatives were carefully and thoroughly reviewed with the patient. Ample time for discussion and questions allowed. The patient understood, was satisfied, and agreed to proceed.

## 2013-11-29 NOTE — Anesthesia Postprocedure Evaluation (Signed)
  Anesthesia Post-op Note  Patient: [name]  Procedure(s) Performed: Procedure(s) (LRB): ESOPHAGOGASTRODUODENOSCOPY (EGD) (N/A) ESOPHAGEAL BANDING (Bilateral)  Patient Location: PACU  Anesthesia Type: MAC  Level of Consciousness: awake and alert   Airway and Oxygen Therapy: Patient Spontanous Breathing  Post-op Pain: mild  Post-op Assessment: Post-op Vital signs reviewed, Patient's Cardiovascular Status Stable, Respiratory Function Stable, Patent Airway and No signs of Nausea or vomiting  Last Vitals:  Filed Vitals:   11/29/13 1120  BP: 189/90  Pulse:   Temp: 36.7 C  Resp:     Post-op Vital Signs: stable   Complications: No apparent anesthesia complications

## 2013-11-29 NOTE — Op Note (Signed)
[facility] [place], [address]   ENDOSCOPY PROCEDURE REPORT  PATIENT: Horatio, Bertz  MR#: [id number] BIRTHDATE: Mar 29, 1951 , 63  yrs. old GENDER: Male ENDOSCOPIST: Eustace Quail, MD REFERRED BY:  Surveillance Program Recall PROCEDURE DATE:  11/29/2013 PROCEDURE:  EGD w/ band ligation of varices  x 2 ASA CLASS:     Class III INDICATIONS:  Therapeutic procedure. MEDICATIONS: MAC sedation, administered by CRNA and See Anesthesia Report. TOPICAL ANESTHETIC: Cetacaine Spray  DESCRIPTION OF PROCEDURE: After the risks benefits and alternatives of the procedure were thoroughly explained, informed consent was obtained.  The Pentax Gastroscope [id number] endoscope was introduced through the mouth and advanced to the second portion of the duodenum. Without limitations.  The instrument was slowly withdrawn as the mucosa was fully examined.    EXAM: The esophagus revealed scarring in the distal portion from prior band ligation.  As well, 2 prominent varices with red wale sign.  Band ligation x2 performed.  The stomach revealed moderate portal gastropathy with friable mucosa and multiple benign fundic gland-type polyps.  The duodenum was normal.  Retroflexed views revealed no abnormalities.     The scope was then withdrawn from the patient and the procedure completed.  COMPLICATIONS: There were no complications. ENDOSCOPIC IMPRESSION: 1. Esophageal varices status post band ligation x2 2. Portal gastropathy  RECOMMENDATIONS: 1.  Clear liquids until 2 PM, then soft foods rest of day.  Resume prior diet tomorrow. 2.  Continue current medications 3.  Office visit in 3 months. Repeat Endoscopy in 6 months  REPEAT EXAM:  eSigned:  Eustace Quail, MD 11/29/2013 11:14 AM   CC:The Patient

## 2013-11-30 ENCOUNTER — Encounter (HOSPITAL_COMMUNITY): Payer: Self-pay | Admitting: Internal Medicine

## 2013-12-08 ENCOUNTER — Telehealth: Payer: Self-pay

## 2013-12-08 NOTE — Telephone Encounter (Signed)
Pt aware.

## 2013-12-08 NOTE — Telephone Encounter (Signed)
Message copied by Algernon Huxley on Thu Dec 08, 2013  9:23 AM ------      Message from: Alianis Trimmer R      Created: Mon Oct 10, 2013 11:03 AM      Regarding: BMET       Pt needs labs, order in epic. ------

## 2013-12-15 ENCOUNTER — Other Ambulatory Visit (INDEPENDENT_AMBULATORY_CARE_PROVIDER_SITE_OTHER): Payer: Managed Care, Other (non HMO)

## 2013-12-15 DIAGNOSIS — K7460 Unspecified cirrhosis of liver: Secondary | ICD-10-CM

## 2013-12-15 LAB — BASIC METABOLIC PANEL
BUN: 10 mg/dL (ref 6–23)
CO2: 28 mEq/L (ref 19–32)
Calcium: 9.3 mg/dL (ref 8.4–10.5)
Chloride: 105 mEq/L (ref 96–112)
Creatinine, Ser: 0.7 mg/dL (ref 0.4–1.5)
GFR: 130.3 mL/min (ref >60.00)
Glucose, Bld: 213 mg/dL — ABNORMAL HIGH (ref 70–99)
POTASSIUM: 4.2 meq/L (ref 3.5–5.1)
Sodium: 138 mEq/L (ref 135–145)

## 2013-12-16 ENCOUNTER — Telehealth: Payer: Self-pay | Admitting: Internal Medicine

## 2013-12-16 ENCOUNTER — Other Ambulatory Visit: Payer: Self-pay | Admitting: *Deleted

## 2013-12-16 DIAGNOSIS — K7460 Unspecified cirrhosis of liver: Secondary | ICD-10-CM

## 2013-12-16 NOTE — Telephone Encounter (Signed)
Spoke with pt and let him know we do have the form and will get it to [name] for him to complete.

## 2013-12-21 ENCOUNTER — Telehealth: Payer: Self-pay

## 2013-12-21 NOTE — Telephone Encounter (Signed)
Completed patient's disability form and sent it down to medical records.

## 2014-02-07 ENCOUNTER — Ambulatory Visit (INDEPENDENT_AMBULATORY_CARE_PROVIDER_SITE_OTHER): Payer: Managed Care, Other (non HMO) | Admitting: Internal Medicine

## 2014-02-07 ENCOUNTER — Encounter: Payer: Self-pay | Admitting: Internal Medicine

## 2014-02-07 VITALS — BP 88/42 | HR 56 | Ht 70.0 in | Wt 198.0 lb

## 2014-02-07 DIAGNOSIS — K219 Gastro-esophageal reflux disease without esophagitis: Secondary | ICD-10-CM

## 2014-02-07 DIAGNOSIS — I8500 Esophageal varices without bleeding: Secondary | ICD-10-CM

## 2014-02-07 DIAGNOSIS — Z86010 Personal history of colonic polyps: Secondary | ICD-10-CM

## 2014-02-07 DIAGNOSIS — K7460 Unspecified cirrhosis of liver: Secondary | ICD-10-CM

## 2014-02-07 NOTE — Progress Notes (Signed)
HISTORY OF PRESENT ILLNESS:  [name] is a 63 y.o. male with multiple medical problems as listed below. 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 Complications of his liver disease have included variceal bleeding and ascites. He is also followed by the [facility] liver clinic. He was last evaluated 11/29/2013 for surveillance upper endoscopy. Band ligation x2 was performed. Moderate portal gastropathy noted. No problems thereafter. He continues on propranolol 20 mg daily. Also, for GERD, pantoprazole 40 mg daily. Since his last encounter, he reports feeling well. He is due to be seen at [facility] this month. He tells me that he has low testosterone and wonders if replacement therapy would be okay. His primary provider requests a note in this regard  REVIEW OF SYSTEMS:  All non-GI ROS negative upon review  Past Medical History  Diagnosis Date  . Reflux esophagitis   . Portal hypertension   . Stricture and stenosis of esophagus   . Diverticulosis of colon (without mention of hemorrhage)   . Internal hemorrhoids without mention of complication   . Benign neoplasm of colon   . Duodenitis without mention of hemorrhage   . 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 Personal history of diseases of blood and blood-forming organs   . Cirrhosis of liver without mention of alcohol   . Unspecified essential hypertension   . Obesity, unspecified   . Type II or unspecified type diabetes mellitus without mention of complication, not stated as uncontrolled   . GERD (gastroesophageal reflux disease)   . Hemorrhoids   . Colon polyps   . Pancytopenia   . Calculus of kidney     Past Surgical History  Procedure Laterality Date  . Kidney stone surgery  yrs ago  . Esophagogastroduodenoscopy  08/01/2011    Procedure: ESOPHAGOGASTRODUODENOSCOPY (EGD);  Surgeon: Scarlette Shorts, MD;  Location: [facility];  Service: Endoscopy;  Laterality: N/A;  . Esophagogastroduodenoscopy  N/A 10/27/2012    Procedure: ESOPHAGOGASTRODUODENOSCOPY (EGD);  Surgeon: Inda Castle, MD;  Location: [facility];  Service: Endoscopy;  Laterality: N/A;  . Esophagogastroduodenoscopy N/A 11/22/2012    Procedure: ESOPHAGOGASTRODUODENOSCOPY (EGD);  Surgeon: Irene Shipper, MD;  Location: [facility];  Service: Endoscopy;  Laterality: N/A;  . Colonoscopy    . Upper gastrointestinal endoscopy    . Esophagogastroduodenoscopy N/A 11/29/2013    Procedure: ESOPHAGOGASTRODUODENOSCOPY (EGD);  Surgeon: Irene Shipper, MD;  Location: [facility];  Service: Endoscopy;  Laterality: N/A;  . Esophageal banding Bilateral 11/29/2013    Procedure: ESOPHAGEAL BANDING;  Surgeon: Irene Shipper, MD;  Location: [facility];  Service: Endoscopy;  Laterality: Bilateral;    Social History [name]  reports that he has never smoked. He has never used smokeless tobacco. He reports that he drinks alcohol. He reports that he does not use illicit drugs.  family history includes Aneurysm in his mother; Diabetes in his father and another family member; Heart disease in his father and other. There is no history of Colon cancer, Anesthesia problems, Hypotension, Malignant hyperthermia, or Pseudochol deficiency.  No Known Allergies     PHYSICAL EXAMINATION: Vital signs: BP 88/42  Pulse 56  Ht 5\' 10"  (1.778 m)  Wt 198 lb (89.812 kg)  BMI 28.41 kg/m2  Constitutional: generally well-appearing, no acute distress Psychiatric: alert and oriented x3, cooperative Eyes: extraocular movements intact, anicteric, conjunctiva pink Mouth: oral pharynx moist, no lesions Neck: supple no lymphadenopathy Cardiovascular: heart regular rate and rhythm, no murmur Lungs: clear to  auscultation bilaterally Abdomen: soft, obese, nontender, nondistended, no obvious ascites, no peritoneal signs, normal bowel sounds, no organomegaly Rectal: Omitted Extremities: no lower extremity edema bilaterally Skin: no lesions on visible  extremities Neuro: No focal deficits. No asterixis.    ASSESSMENT:  #1. Hepatic cirrhosis with portal hypertension secondary to NASH. Clinically compensated #2. History of variceal bleed. Last upper endoscopy with band ligation May 2015 #3. General medical problems #4. History of adenomatous colon polyps. Last colonoscopy September 2013. Large tubulovillous adenoma. Followup in 3 years recommended   PLAN:  #1. Continue current medications #2. Repeat surveillance endoscopy in about 3 months #3. Okay for PCP to use testosterone replacement and appropriate doses #4. Keep appointment with [facility] liver clinic this month #5. Routine GI office followup in 6 months #6. Surveillance colonoscopy around September 2016

## 2014-02-07 NOTE — Patient Instructions (Signed)
You are due for another EGD at the hospital in September.  You will get a letter reminding you to schedule your appointment.  Continue all current medications  Please follow up with [name] in 6 months

## 2014-02-12 ENCOUNTER — Other Ambulatory Visit: Payer: Self-pay | Admitting: Internal Medicine

## 2014-02-19 ENCOUNTER — Other Ambulatory Visit: Payer: Self-pay | Admitting: Internal Medicine

## 2014-03-21 ENCOUNTER — Telehealth: Payer: Self-pay

## 2014-03-21 NOTE — Telephone Encounter (Signed)
Pt aware.

## 2014-03-21 NOTE — Telephone Encounter (Signed)
Message copied by Algernon Huxley on Tue Mar 21, 2014 10:07 AM ------      Message from: Hulan Saas      Created: Fri Dec 16, 2013  4:27 PM       Call and remind patient due for BMET for JP on 03/20/14.Lab in EPIC ------

## 2014-03-23 ENCOUNTER — Other Ambulatory Visit (INDEPENDENT_AMBULATORY_CARE_PROVIDER_SITE_OTHER): Payer: Managed Care, Other (non HMO)

## 2014-03-23 DIAGNOSIS — K7460 Unspecified cirrhosis of liver: Secondary | ICD-10-CM

## 2014-03-23 LAB — BASIC METABOLIC PANEL
BUN: 11 mg/dL (ref 6–23)
CO2: 26 meq/L (ref 19–32)
Calcium: 9 mg/dL (ref 8.4–10.5)
Chloride: 104 mEq/L (ref 96–112)
Creatinine, Ser: 0.9 mg/dL (ref 0.4–1.5)
GFR: 94.65 mL/min (ref >60.00)
Glucose, Bld: 406 mg/dL — ABNORMAL HIGH (ref 70–99)
Potassium: 3.9 mEq/L (ref 3.5–5.1)
SODIUM: 137 meq/L (ref 135–145)

## 2014-03-24 ENCOUNTER — Other Ambulatory Visit: Payer: Self-pay

## 2014-03-24 DIAGNOSIS — K7460 Unspecified cirrhosis of liver: Secondary | ICD-10-CM

## 2014-04-17 ENCOUNTER — Other Ambulatory Visit: Payer: Self-pay

## 2014-04-17 ENCOUNTER — Telehealth: Payer: Self-pay

## 2014-04-17 DIAGNOSIS — K7469 Other cirrhosis of liver: Secondary | ICD-10-CM

## 2014-04-17 NOTE — Telephone Encounter (Signed)
Pt scheduled for Twinrix #1 injection 04/21/14@9am , pt aware of appt. Orders in epic per [name]. Records sent to be scanned.

## 2014-04-21 ENCOUNTER — Other Ambulatory Visit: Payer: Self-pay | Admitting: Internal Medicine

## 2014-04-21 ENCOUNTER — Telehealth: Payer: Self-pay

## 2014-04-21 ENCOUNTER — Other Ambulatory Visit (INDEPENDENT_AMBULATORY_CARE_PROVIDER_SITE_OTHER): Payer: Managed Care, Other (non HMO)

## 2014-04-21 ENCOUNTER — Ambulatory Visit (INDEPENDENT_AMBULATORY_CARE_PROVIDER_SITE_OTHER): Payer: Managed Care, Other (non HMO) | Admitting: Internal Medicine

## 2014-04-21 ENCOUNTER — Other Ambulatory Visit: Payer: Self-pay

## 2014-04-21 DIAGNOSIS — I8500 Esophageal varices without bleeding: Secondary | ICD-10-CM

## 2014-04-21 DIAGNOSIS — K7469 Other cirrhosis of liver: Secondary | ICD-10-CM

## 2014-04-21 DIAGNOSIS — K7460 Unspecified cirrhosis of liver: Secondary | ICD-10-CM

## 2014-04-21 DIAGNOSIS — Z23 Encounter for immunization: Secondary | ICD-10-CM

## 2014-04-21 LAB — BASIC METABOLIC PANEL
BUN: 11 mg/dL (ref 6–23)
CHLORIDE: 105 meq/L (ref 96–112)
CO2: 24 mEq/L (ref 19–32)
Calcium: 9.1 mg/dL (ref 8.4–10.5)
Creatinine, Ser: 0.7 mg/dL (ref 0.4–1.5)
GFR: 114.05 mL/min (ref >60.00)
Glucose, Bld: 122 mg/dL — ABNORMAL HIGH (ref 70–99)
POTASSIUM: 4 meq/L (ref 3.5–5.1)
Sodium: 135 mEq/L (ref 135–145)

## 2014-04-21 LAB — CBC WITH DIFFERENTIAL/PLATELET
HEMATOCRIT: 40.5 % (ref 39.0–52.0)
HEMOGLOBIN: 13.7 g/dL (ref 13.0–17.0)
MCHC: 34 g/dL (ref 30.0–36.0)
MCV: 91.3 fl (ref 78.0–100.0)
Platelets: 24 10*3/uL — CL (ref 150.0–400.0)
RBC: 4.43 Mil/uL (ref 4.22–5.81)
RDW: 15.2 % (ref 11.5–15.5)
WBC: 4.5 10*3/uL (ref 4.0–10.5)

## 2014-04-21 NOTE — Telephone Encounter (Signed)
Patient comes in for his Hepatitis injection. He asks to speak with a nurse about his symptoms. He complains of abdominal distention, constipation and nausea. He feels very tired.He is wearing his usually pants, he is not short of breath and he denies hematochezia or hematemesis. He does not appear in any distress. Skin is warm and dry. Discussed with Alonza Bogus, PA. 1) Patient will get his hepatitis vaccine as planned. 2) CBC today 3) Magnesium Citrate today for constipation. 4) get EGD scheduled as was previously planned per note from Dr [name].

## 2014-04-22 ENCOUNTER — Other Ambulatory Visit: Payer: Self-pay | Admitting: Endocrinology

## 2014-04-24 ENCOUNTER — Other Ambulatory Visit: Payer: Self-pay | Admitting: *Deleted

## 2014-04-24 DIAGNOSIS — K7460 Unspecified cirrhosis of liver: Secondary | ICD-10-CM

## 2014-04-26 ENCOUNTER — Other Ambulatory Visit: Payer: Self-pay

## 2014-04-26 ENCOUNTER — Telehealth: Payer: Self-pay

## 2014-04-26 DIAGNOSIS — I8500 Esophageal varices without bleeding: Secondary | ICD-10-CM

## 2014-04-26 NOTE — Telephone Encounter (Signed)
Pt scheduled for EGD with possible banding at [facility] 06/13/14@9am . Pt to arrive at the hospital at 8am and be NPO after midnight. Pt aware of appt.

## 2014-04-26 NOTE — Telephone Encounter (Signed)
Message copied by Algernon Huxley on Wed Apr 26, 2014 11:40 AM ------      Message from: Virgina Evener A      Created: Fri Apr 21, 2014  9:18 AM      Regarding: schedule egd       He needs an egd with banding in November. ------

## 2014-04-28 ENCOUNTER — Ambulatory Visit (INDEPENDENT_AMBULATORY_CARE_PROVIDER_SITE_OTHER): Payer: Managed Care, Other (non HMO) | Admitting: Internal Medicine

## 2014-04-28 DIAGNOSIS — K7469 Other cirrhosis of liver: Secondary | ICD-10-CM

## 2014-04-28 DIAGNOSIS — Z23 Encounter for immunization: Secondary | ICD-10-CM

## 2014-05-12 ENCOUNTER — Ambulatory Visit (INDEPENDENT_AMBULATORY_CARE_PROVIDER_SITE_OTHER): Payer: Managed Care, Other (non HMO) | Admitting: Internal Medicine

## 2014-05-12 DIAGNOSIS — K7469 Other cirrhosis of liver: Secondary | ICD-10-CM

## 2014-05-12 DIAGNOSIS — Z23 Encounter for immunization: Secondary | ICD-10-CM

## 2014-05-29 ENCOUNTER — Encounter (HOSPITAL_COMMUNITY): Payer: Self-pay | Admitting: *Deleted

## 2014-06-13 ENCOUNTER — Ambulatory Visit (HOSPITAL_COMMUNITY): Payer: Managed Care, Other (non HMO) | Admitting: *Deleted

## 2014-06-13 ENCOUNTER — Ambulatory Visit (HOSPITAL_COMMUNITY)
Admission: RE | Admit: 2014-06-13 | Discharge: 2014-06-13 | Disposition: A | Discharge status: Home / Self Care | Payer: Managed Care, Other (non HMO) | Source: Ambulatory Visit | Attending: Internal Medicine | Admitting: Internal Medicine

## 2014-06-13 ENCOUNTER — Encounter (HOSPITAL_COMMUNITY): Payer: Self-pay | Admitting: *Deleted

## 2014-06-13 ENCOUNTER — Encounter (HOSPITAL_COMMUNITY)
Admission: RE | Disposition: A | Discharge status: Home / Self Care | Payer: Self-pay | Source: Ambulatory Visit | Attending: Internal Medicine

## 2014-06-13 DIAGNOSIS — K7460 Unspecified cirrhosis of liver: Secondary | ICD-10-CM | POA: Insufficient documentation

## 2014-06-13 DIAGNOSIS — K219 Gastro-esophageal reflux disease without esophagitis: Secondary | ICD-10-CM | POA: Diagnosis not present

## 2014-06-13 DIAGNOSIS — I8500 Esophageal varices without bleeding: Secondary | ICD-10-CM | POA: Insufficient documentation

## 2014-06-13 DIAGNOSIS — I10 Essential (primary) hypertension: Secondary | ICD-10-CM | POA: Insufficient documentation

## 2014-06-13 DIAGNOSIS — K3189 Other diseases of stomach and duodenum: Secondary | ICD-10-CM | POA: Diagnosis not present

## 2014-06-13 DIAGNOSIS — Z6828 Body mass index (BMI) 28.0-28.9, adult: Secondary | ICD-10-CM | POA: Diagnosis not present

## 2014-06-13 DIAGNOSIS — E669 Obesity, unspecified: Secondary | ICD-10-CM | POA: Diagnosis not present

## 2014-06-13 DIAGNOSIS — E119 Type 2 diabetes mellitus without complications: Secondary | ICD-10-CM | POA: Insufficient documentation

## 2014-06-13 HISTORY — PX: ESOPHAGEAL BANDING: SHX5518

## 2014-06-13 HISTORY — PX: ESOPHAGOGASTRODUODENOSCOPY: SHX5428

## 2014-06-13 LAB — GLUCOSE, CAPILLARY: Glucose-Capillary: 114 mg/dL — ABNORMAL HIGH (ref 70–99)

## 2014-06-13 SURGERY — EGD (ESOPHAGOGASTRODUODENOSCOPY)
Anesthesia: Monitor Anesthesia Care

## 2014-06-13 MED ORDER — LIDOCAINE HCL (CARDIAC) 20 MG/ML IV SOLN
INTRAVENOUS | Status: AC
  Filled 2014-06-13: qty 5

## 2014-06-13 MED ORDER — PROPOFOL INFUSION 10 MG/ML OPTIME
INTRAVENOUS | Status: DC | PRN
  Administered 2014-06-13: 140 ug/kg/min via INTRAVENOUS

## 2014-06-13 MED ORDER — PROPOFOL 10 MG/ML IV BOLUS
INTRAVENOUS | Status: AC
  Filled 2014-06-13: qty 20

## 2014-06-13 MED ORDER — GLYCOPYRROLATE 0.2 MG/ML IJ SOLN
INTRAMUSCULAR | Status: DC | PRN
  Administered 2014-06-13 (×2): 0.1 mg via INTRAVENOUS

## 2014-06-13 MED ORDER — SODIUM CHLORIDE 0.9 % IV SOLN: INTRAVENOUS | Status: DC

## 2014-06-13 MED ORDER — PROPOFOL 10 MG/ML IV BOLUS
INTRAVENOUS | Status: DC | PRN
  Administered 2014-06-13 (×2): 50 mg via INTRAVENOUS

## 2014-06-13 MED ORDER — LIDOCAINE HCL (CARDIAC) 20 MG/ML IV SOLN
INTRAVENOUS | Status: DC | PRN
  Administered 2014-06-13: 50 mg via INTRAVENOUS

## 2014-06-13 MED ORDER — LACTATED RINGERS IV SOLN
INTRAVENOUS | Status: DC
  Administered 2014-06-13: 1000 mL via INTRAVENOUS

## 2014-06-13 NOTE — Op Note (Signed)
[facility] [place], [address]   ENDOSCOPY PROCEDURE REPORT  Burch: Tanor, Glaspy  MR#: [id number] BIRTHDATE: 1950-10-03 , 63  yrs. old GENDER: male ENDOSCOPIST: Eustace Quail, MD REFERRED BY:  Surveillance Program Recall PROCEDURE DATE:  06/13/2014 PROCEDURE:  EGD, diagnostic ASA CLASS:     Class III INDICATIONS:  surveillance.  esophageal varices. Last banded May 2015 MEDICATIONS: Monitored anesthesia care and Per Anesthesia TOPICAL ANESTHETIC: none  DESCRIPTION OF PROCEDURE: After Jonathan risks benefits and alternatives of Jonathan procedure were thoroughly explained, informed consent was obtained.  Jonathan [place] [id number] endoscope was introduced through Jonathan mouth and advanced to Jonathan second portion of Jonathan duodenum , Without limitations.  Jonathan instrument was slowly withdrawn as Jonathan mucosa was fully examined.    EXAM: Jonathan esophagus revealed 2 small columns (1+) in Jonathan midportion without stigmata.  Jonathan distal esophagus revealed significant scarring from previous band ligation.  No prominent or bandable varices present.  Jonathan stomach revealed diffuse moderate portal gastropathy.  Retroflexed view did not reveal obvious proximal gastric varices.  Jonathan duodenal fold and post bulbar duodenum were normal.  Retroflexed views revealed no abnormalities.     Jonathan scope was then withdrawn from [name] and Jonathan procedure completed.  COMPLICATIONS: There were no immediate complications.  ENDOSCOPIC IMPRESSION: 1. Distal esophageal scarring and small mid esophageal varices. No banding required 2. Portal gastropathy (moderate)  RECOMMENDATIONS: 1. Routine office appointment with [name] in 6 months 2. Repeat upper Endoscopy with possible banding in 1 year  REPEAT EXAM:  eSigned:  Eustace Quail, MD 06/13/2014 9:43 AM    CC:[name]

## 2014-06-13 NOTE — H&P (Signed)
  HISTORY OF PRESENT ILLNESS:  [name] is a 63 y.o. male for surveillance EGD for varices. Last exam 11-2013 banded x 2. No interval problems  REVIEW OF SYSTEMS:  All non-GI ROS negative   Past Medical History  Diagnosis Date  . Reflux esophagitis   . Portal hypertension   . Stricture and stenosis of esophagus   . Diverticulosis of colon (without mention of hemorrhage)   . Internal hemorrhoids without mention of complication   . Benign neoplasm of colon   . Duodenitis without mention of hemorrhage   . 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 Personal history of diseases of blood and blood-forming organs   . Cirrhosis of liver without mention of alcohol   . Unspecified essential hypertension   . Obesity, unspecified   . Type II or unspecified type diabetes mellitus without mention of complication, not stated as uncontrolled   . GERD (gastroesophageal reflux disease)   . Hemorrhoids   . Colon polyps   . Pancytopenia   . Calculus of kidney yrs ago    Past Surgical History  Procedure Laterality Date  . Kidney stone surgery  yrs ago  . Esophagogastroduodenoscopy  08/01/2011    Procedure: ESOPHAGOGASTRODUODENOSCOPY (EGD);  Surgeon: Scarlette Shorts, MD;  Location: [facility];  Service: Endoscopy;  Laterality: N/A;  . Esophagogastroduodenoscopy N/A 10/27/2012    Procedure: ESOPHAGOGASTRODUODENOSCOPY (EGD);  Surgeon: Inda Castle, MD;  Location: [facility];  Service: Endoscopy;  Laterality: N/A;  . Esophagogastroduodenoscopy N/A 11/22/2012    Procedure: ESOPHAGOGASTRODUODENOSCOPY (EGD);  Surgeon: Irene Shipper, MD;  Location: [facility];  Service: Endoscopy;  Laterality: N/A;  . Colonoscopy    . Upper gastrointestinal endoscopy    . Esophagogastroduodenoscopy N/A 11/29/2013    Procedure: ESOPHAGOGASTRODUODENOSCOPY (EGD);  Surgeon: Irene Shipper, MD;  Location: [facility];  Service: Endoscopy;  Laterality: N/A;  . Esophageal banding Bilateral 11/29/2013    Procedure:  ESOPHAGEAL BANDING;  Surgeon: Irene Shipper, MD;  Location: [facility];  Service: Endoscopy;  Laterality: Bilateral;    Social History [name]  reports that he has never smoked. He has never used smokeless tobacco. He reports that he drinks alcohol. He reports that he does not use illicit drugs.  family history includes Aneurysm in his mother; Diabetes in his father and another family member; Heart disease in his father and other. There is no history of Colon cancer, Anesthesia problems, Hypotension, Malignant hyperthermia, or Pseudochol deficiency.  No Known Allergies     PHYSICAL EXAMINATION: Vital signs: BP 108/53 mmHg  Temp(Src) 98 F (36.7 C) (Oral)  Resp 13  Ht 5\' 10"  (1.778 m)  Wt 196 lb (88.905 kg)  BMI 28.12 kg/m2  SpO2 99% General: Well-developed, well-nourished, no acute distress HEENT: Sclerae are anicteric, conjunctiva pink. Oral mucosa intact Lungs: Clear Heart: Regular Abdomen: soft, nontender, nondistended, no obvious ascites, no peritoneal signs, normal bowel sounds. No organomegaly. Extremities: No edema Psychiatric: alert and oriented x3. Cooperative    ASSESSMENT:  1. NASH/cirrhosis w/ varices     PLAN:  1. EGD +/- banding.The nature of the procedure, as well as the risks, benefits, and alternatives were carefully and thoroughly reviewed with the patient. Ample time for discussion and questions allowed. The patient understood, was satisfied, and agreed to proceed.

## 2014-06-13 NOTE — Transfer of Care (Signed)
Immediate Anesthesia Transfer of Care Note  Patient: [name]  Procedure(s) Performed: Procedure(s): ESOPHAGOGASTRODUODENOSCOPY (EGD) (N/A) ESOPHAGEAL BANDING (N/A)  Patient Location: PACU  Anesthesia Type:MAC  Level of Consciousness: Patient easily awoken, sedated, comfortable, cooperative, following commands, responds to stimulation.   Airway & Oxygen Therapy: Patient spontaneously breathing, ventilating well, oxygen via [place].  Post-op Assessment: Report given to PACU RN, vital signs reviewed and stable, moving all extremities.   Post vital signs: Reviewed and stable.  Complications: No apparent anesthesia complications

## 2014-06-13 NOTE — Anesthesia Preprocedure Evaluation (Signed)
Anesthesia Evaluation  Patient identified by MRN, date of birth, ID band Patient awake    Reviewed: Allergy & Precautions, H&P , NPO status , Patient's Chart, lab work & pertinent test results, reviewed documented beta blocker date and time   Airway Mallampati: II  TM Distance: >3 FB Neck ROM: Full    Dental no notable dental hx.    Pulmonary neg pulmonary ROS,  breath sounds clear to auscultation  Pulmonary exam normal       Cardiovascular hypertension, Pt. on medications and Pt. on home beta blockers Rhythm:Regular Rate:Normal     Neuro/Psych negative neurological ROS  negative psych ROS   GI/Hepatic GERD-  Medicated,(+) Cirrhosis -  Esophageal Varices    ,   Endo/Other  diabetes, Type 2, Oral Hypoglycemic Agents  Renal/GU Renal diseasenegative Renal ROS     Musculoskeletal negative musculoskeletal ROS (+)   Abdominal   Peds  Hematology  (+) anemia ,   Anesthesia Other Findings   Reproductive/Obstetrics                             Anesthesia Physical  Anesthesia Plan  ASA: III  Anesthesia Plan: MAC   Post-op Pain Management:    Induction: Intravenous  Airway Management Planned:   Additional Equipment:   Intra-op Plan:   Post-operative Plan:   Informed Consent: I have reviewed the patients History and Physical, chart, labs and discussed the procedure including the risks, benefits and alternatives for the proposed anesthesia with the patient or authorized representative who has indicated his/her understanding and acceptance.   Dental advisory given  Plan Discussed with: CRNA  Anesthesia Plan Comments:         Anesthesia Quick Evaluation

## 2014-06-13 NOTE — Discharge Instructions (Signed)
Esophageal Varices Esophageal varices are blood vessels in the esophagus (the tube that carries food to the stomach). Under normal circumstances, these blood vessels carry very small amounts of blood. If the liver is damaged, and the main vein (portal vein) that carries blood is blocked, larger amounts of blood might back up into these esophageal varices. The esophageal varices are too fragile for this extra blood flow and pressure. They may swell and then break, causing life-threatening bleeding (hemorrhage). CAUSES  Any kind of liver disease can cause esophageal varices. Cirrhosis of the liver, usually due to alcoholism, is the most common reason. Other reasons include:  Severe heart failure: When the heart cannot pump blood around the body effectively enough, pressure may rise in the portal vein.  A blood clot in the portal vein.  Sarcoidosis. This is an inflammatory disease that can affect the liver.  Schistosomiasis. A parasitic infection that can cause liver damage. SYMPTOMS  Symptoms may include:  Vomiting bright red or black coffee ground like material.  Black, tarry stools.  Low blood pressure.  Dizziness.  Loss of consciousness. DIAGNOSIS  When someone has known cirrhosis, their caregiver may screen them for the presence of esophageal varices. Tests that are used include:  Endoscopy (esophagogastroduodenoscopy or EGD). A thin, lighted tube is inserted through the mouth and into the esophagus. The caregiver will rate the varices according to their size and the presence of red streaks. These characteristics help determine the risk of bleeding.  Imaging tests. CT scans and MRI scans can both show esophageal varices. However, they cannot predict likelihood of bleeding. TREATMENT  There are different types of treatment used for esophageal varices. These include:  Variceal ligation. During EGD, the caregiver places a rubber band around the vein to prevent bleeding.  Injection  therapy. During EGD, the caregiver can inject the veins with a solution that shrinks them and scars them closed.  Medications can decrease the pressure in the esophageal varices and prevent bleeding.  Balloon tamponade. A tube is put into the esophagus and a balloon is passed down it. When the balloon is inflated, it puts pressures on the veins and stops the bleeding.  Shunt. A small tube is placed within the liver veins. This decreases the blood flow and pressure to the varices, decreasing bleeding risk.  Liver transplant may be done as a last resort. HOME CARE INSTRUCTIONS   Take all medications exactly as directed.  Follow any prescribed diet. Avoid alcohol if recommended.  Follow instructions regarding both rest and physical activity.  Seek help to treat a drinking problem. SEEK IMMEDIATE MEDICAL CARE IF:  You vomit blood or coffee-ground material.  You pass black tarry stools or bright red blood in the stools.  You are dizzy, lightheaded or faint.  You are unable to eat or drink.  You experience chest pain. Document Released: 09/13/2003 Document Revised: 09/15/2011 Document Reviewed: 08/24/2008 [facility] Patient Information 2015 [place], [place]. This information is not intended to replace advice given to you by your health care provider. Make sure you discuss any questions you have with your health care provider. Esophagogastroduodenoscopy Care After Refer to this sheet in the next few weeks. These instructions provide you with information on caring for yourself after your procedure. Your caregiver may also give you more specific instructions. Your treatment has been planned according to current medical practices, but problems sometimes occur. Call your caregiver if you have any problems or questions after your procedure.  HOME CARE INSTRUCTIONS  Do not eat  or drink anything until the numbing medicine (local anesthetic) has worn off and your gag reflex has returned. You will  know that the local anesthetic has worn off when you can swallow comfortably.  Do not drive for 12 hours after the procedure or as directed by your caregiver.  Only take medicines as directed by your caregiver. SEEK MEDICAL CARE IF:   You cannot stop coughing.  You are not urinating at all or less than usual. SEEK IMMEDIATE MEDICAL CARE IF:  You have difficulty swallowing.  You cannot eat or drink.  You have worsening throat or chest pain.  You have dizziness, lightheadedness, or you faint.  You have nausea or vomiting.  You have chills.  You have a fever.  You have severe abdominal pain.  You have black, tarry, or bloody stools. Document Released: 06/09/2012 Document Reviewed: 06/09/2012 [facility] Patient Information 2015 [place]. This information is not intended to replace advice given to you by your health care provider. Make sure you discuss any questions you have with your health care provider.

## 2014-06-13 NOTE — Anesthesia Postprocedure Evaluation (Signed)
Anesthesia Post Note  Patient: [name]  Procedure(s) Performed: Procedure(s) (LRB): ESOPHAGOGASTRODUODENOSCOPY (EGD) (N/A) ESOPHAGEAL BANDING (N/A)  Anesthesia type: MAC  Patient location: PACU  Post pain: Pain level controlled  Post assessment: Post-op Vital signs reviewed  Last Vitals: BP 139/106 mmHg  Pulse 61  Temp(Src) 36.7 C (Oral)  Resp 14  Ht 5\' 10"  (1.778 m)  Wt 196 lb (88.905 kg)  BMI 28.12 kg/m2  SpO2 98%  Post vital signs: Reviewed  Level of consciousness: awake  Complications: No apparent anesthesia complications

## 2014-06-14 ENCOUNTER — Encounter (HOSPITAL_COMMUNITY): Payer: Self-pay | Admitting: Internal Medicine

## 2014-07-20 ENCOUNTER — Telehealth: Payer: Self-pay

## 2014-07-20 NOTE — Telephone Encounter (Signed)
-----   Message from Hulan Saas, RN sent at 04/24/2014 10:10 AM EDT ----- Call and remind patient due for Bmet for JP on 07/24/14. Lab in [facility]

## 2014-07-20 NOTE — Telephone Encounter (Signed)
Left message for pt to call back  °

## 2014-07-21 NOTE — Telephone Encounter (Signed)
Left message for pt to call back.  Spoke with pt and he is aware. 

## 2014-10-06 ENCOUNTER — Encounter: Payer: Self-pay | Admitting: Internal Medicine

## 2014-10-20 ENCOUNTER — Inpatient Hospital Stay (HOSPITAL_COMMUNITY)
Admission: EM | Admit: 2014-10-20 | Discharge: 2014-10-25 | DRG: 094 | Disposition: A | Discharge status: Rehabilitation Facility | Payer: Managed Care, Other (non HMO) | Source: Other Acute Inpatient Hospital | Attending: Internal Medicine | Admitting: Internal Medicine

## 2014-10-20 ENCOUNTER — Encounter (HOSPITAL_COMMUNITY): Payer: Self-pay | Admitting: Internal Medicine

## 2014-10-20 DIAGNOSIS — K7581 Nonalcoholic steatohepatitis (NASH): Secondary | ICD-10-CM | POA: Diagnosis present

## 2014-10-20 DIAGNOSIS — E1142 Type 2 diabetes mellitus with diabetic polyneuropathy: Secondary | ICD-10-CM | POA: Diagnosis present

## 2014-10-20 DIAGNOSIS — R7881 Bacteremia: Secondary | ICD-10-CM | POA: Diagnosis present

## 2014-10-20 DIAGNOSIS — K210 Gastro-esophageal reflux disease with esophagitis: Secondary | ICD-10-CM | POA: Diagnosis present

## 2014-10-20 DIAGNOSIS — R509 Fever, unspecified: Secondary | ICD-10-CM

## 2014-10-20 DIAGNOSIS — K5900 Constipation, unspecified: Secondary | ICD-10-CM | POA: Diagnosis not present

## 2014-10-20 DIAGNOSIS — D696 Thrombocytopenia, unspecified: Secondary | ICD-10-CM | POA: Diagnosis not present

## 2014-10-20 DIAGNOSIS — E1165 Type 2 diabetes mellitus with hyperglycemia: Secondary | ICD-10-CM | POA: Diagnosis not present

## 2014-10-20 DIAGNOSIS — G9340 Encephalopathy, unspecified: Secondary | ICD-10-CM | POA: Diagnosis present

## 2014-10-20 DIAGNOSIS — B955 Unspecified streptococcus as the cause of diseases classified elsewhere: Secondary | ICD-10-CM | POA: Diagnosis not present

## 2014-10-20 DIAGNOSIS — K7460 Unspecified cirrhosis of liver: Secondary | ICD-10-CM | POA: Diagnosis present

## 2014-10-20 DIAGNOSIS — G061 Intraspinal abscess and granuloma: Principal | ICD-10-CM | POA: Diagnosis present

## 2014-10-20 DIAGNOSIS — R414 Neurologic neglect syndrome: Secondary | ICD-10-CM | POA: Diagnosis present

## 2014-10-20 DIAGNOSIS — G062 Extradural and subdural abscess, unspecified: Secondary | ICD-10-CM | POA: Diagnosis not present

## 2014-10-20 DIAGNOSIS — G959 Disease of spinal cord, unspecified: Secondary | ICD-10-CM | POA: Diagnosis not present

## 2014-10-20 DIAGNOSIS — K766 Portal hypertension: Secondary | ICD-10-CM | POA: Diagnosis present

## 2014-10-20 DIAGNOSIS — K913 Postprocedural intestinal obstruction: Secondary | ICD-10-CM | POA: Diagnosis not present

## 2014-10-20 DIAGNOSIS — M5412 Radiculopathy, cervical region: Secondary | ICD-10-CM | POA: Diagnosis not present

## 2014-10-20 DIAGNOSIS — B951 Streptococcus, group B, as the cause of diseases classified elsewhere: Secondary | ICD-10-CM | POA: Diagnosis not present

## 2014-10-20 DIAGNOSIS — M542 Cervicalgia: Secondary | ICD-10-CM | POA: Diagnosis present

## 2014-10-20 LAB — GLUCOSE, CAPILLARY: GLUCOSE-CAPILLARY: 139 mg/dL — AB (ref 70–99)

## 2014-10-20 MED ORDER — PANTOPRAZOLE SODIUM 40 MG PO TBEC
40.0000 mg | DELAYED_RELEASE_TABLET | Freq: Every morning | ORAL | Status: DC
  Administered 2014-10-21 – 2014-10-25 (×5): 40 mg via ORAL
  Filled 2014-10-20 (×5): qty 1

## 2014-10-20 MED ORDER — SODIUM CHLORIDE 0.9 % IJ SOLN
3.0000 mL | Freq: Two times a day (BID) | INTRAMUSCULAR | Status: DC
  Administered 2014-10-20 – 2014-10-24 (×5): 3 mL via INTRAVENOUS

## 2014-10-20 MED ORDER — VITAMIN D (ERGOCALCIFEROL) 1.25 MG (50000 UNIT) PO CAPS
50000.0000 [IU] | ORAL_CAPSULE | ORAL | Status: DC
  Administered 2014-10-23: 50000 [IU] via ORAL
  Filled 2014-10-20: qty 1

## 2014-10-20 MED ORDER — SODIUM CHLORIDE 0.9 % IJ SOLN
3.0000 mL | Freq: Two times a day (BID) | INTRAMUSCULAR | Status: DC
  Administered 2014-10-21 – 2014-10-24 (×4): 3 mL via INTRAVENOUS

## 2014-10-20 MED ORDER — CANAGLIFLOZIN 300 MG PO TABS
300.0000 mg | ORAL_TABLET | Freq: Every morning | ORAL | Status: DC
  Administered 2014-10-21: 300 mg via ORAL
  Filled 2014-10-20: qty 300

## 2014-10-20 MED ORDER — MAGNESIUM OXIDE 400 (241.3 MG) MG PO TABS
400.0000 mg | ORAL_TABLET | Freq: Every morning | ORAL | Status: DC
  Administered 2014-10-21: 400 mg via ORAL
  Filled 2014-10-20: qty 1

## 2014-10-20 MED ORDER — INSULIN ASPART 100 UNIT/ML ~~LOC~~ SOLN: 0.0000 [IU] | Freq: Every day | SUBCUTANEOUS | Status: DC

## 2014-10-20 MED ORDER — INSULIN ASPART 100 UNIT/ML ~~LOC~~ SOLN
0.0000 [IU] | Freq: Three times a day (TID) | SUBCUTANEOUS | Status: DC
  Administered 2014-10-21: 1 [IU] via SUBCUTANEOUS
  Administered 2014-10-22 – 2014-10-24 (×7): 2 [IU] via SUBCUTANEOUS
  Administered 2014-10-24: 1 [IU] via SUBCUTANEOUS
  Administered 2014-10-25: 3 [IU] via SUBCUTANEOUS
  Administered 2014-10-25: 1 [IU] via SUBCUTANEOUS
  Administered 2014-10-25: 2 [IU] via SUBCUTANEOUS

## 2014-10-20 MED ORDER — LINAGLIPTIN 5 MG PO TABS
5.0000 mg | ORAL_TABLET | Freq: Every morning | ORAL | Status: DC
  Administered 2014-10-21: 5 mg via ORAL
  Filled 2014-10-20: qty 1

## 2014-10-20 MED ORDER — SODIUM CHLORIDE 0.9 % IJ SOLN: 3.0000 mL | INTRAMUSCULAR | Status: DC | PRN

## 2014-10-20 MED ORDER — PROPRANOLOL HCL 20 MG PO TABS
20.0000 mg | ORAL_TABLET | Freq: Every morning | ORAL | Status: DC
  Administered 2014-10-21 – 2014-10-25 (×5): 20 mg via ORAL
  Filled 2014-10-20 (×5): qty 1

## 2014-10-20 MED ORDER — SODIUM CHLORIDE 0.9 % IV SOLN: 250.0000 mL | INTRAVENOUS | Status: DC | PRN

## 2014-10-20 NOTE — H&P (Signed)
[name] is an 64 y.o. male.     Chief Complaint: neck pain HPI: 64 yo male with dm2, cirrhosis, apparently c/o neck pain for the past 2 weeks.  Apparently had fever, and blood culture by pcp grew out gram positive cocci,  Pt was instructed to go to the ED.  Pt went to ED at [facility] and there was concern for ? Infection of the spine.  ED wanted MRI which was not available there and therefore patient was transferred to [facility].  Pt will be admitted for neck pain and fever.    Past Medical History  Diagnosis Date  . Reflux esophagitis   . Portal hypertension   . Stricture and stenosis of esophagus   . Diverticulosis of colon (without mention of hemorrhage)   . Internal hemorrhoids without mention of complication   . Benign neoplasm of colon   . Duodenitis without mention of hemorrhage   . 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 Personal history of diseases of blood and blood-forming organs   . Cirrhosis of liver without mention of alcohol   . Unspecified essential hypertension   . Obesity, unspecified   . Type II or unspecified type diabetes mellitus without mention of complication, not stated as uncontrolled   . GERD (gastroesophageal reflux disease)   . Hemorrhoids   . Colon polyps   . Pancytopenia   . Calculus of kidney yrs ago    Past Surgical History  Procedure Laterality Date  . Kidney stone surgery  yrs ago  . Esophagogastroduodenoscopy  08/01/2011    Procedure: ESOPHAGOGASTRODUODENOSCOPY (EGD);  Surgeon: Scarlette Shorts, MD;  Location: [facility];  Service: Endoscopy;  Laterality: N/A;  . Esophagogastroduodenoscopy N/A 10/27/2012    Procedure: ESOPHAGOGASTRODUODENOSCOPY (EGD);  Surgeon: Inda Castle, MD;  Location: [facility];  Service: Endoscopy;  Laterality: N/A;  . Esophagogastroduodenoscopy N/A 11/22/2012    Procedure: ESOPHAGOGASTRODUODENOSCOPY (EGD);  Surgeon: Irene Shipper, MD;  Location: [facility];  Service: Endoscopy;   Laterality: N/A;  . Colonoscopy    . Upper gastrointestinal endoscopy    . Esophagogastroduodenoscopy N/A 11/29/2013    Procedure: ESOPHAGOGASTRODUODENOSCOPY (EGD);  Surgeon: Irene Shipper, MD;  Location: [facility];  Service: Endoscopy;  Laterality: N/A;  . Esophageal banding Bilateral 11/29/2013    Procedure: ESOPHAGEAL BANDING;  Surgeon: Irene Shipper, MD;  Location: [facility];  Service: Endoscopy;  Laterality: Bilateral;  . Esophagogastroduodenoscopy N/A 06/13/2014    Procedure: ESOPHAGOGASTRODUODENOSCOPY (EGD);  Surgeon: Irene Shipper, MD;  Location: [facility];  Service: Endoscopy;  Laterality: N/A;  . Esophageal banding N/A 06/13/2014    Procedure: ESOPHAGEAL BANDING;  Surgeon: Irene Shipper, MD;  Location: [facility];  Service: Endoscopy;  Laterality: N/A;    Family History  Problem Relation Age of Onset  . Colon cancer Neg Hx   . Anesthesia problems Neg Hx   . Hypotension Neg Hx   . Malignant hyperthermia Neg Hx   . Pseudochol deficiency Neg Hx   . Diabetes Father   . Heart disease Father   . Aneurysm Mother   . Heart disease Other     neice  . Diabetes      niece   Social History:  reports that he has never smoked. He has never used smokeless tobacco. He reports that he does not drink alcohol or use illicit drugs.  Allergies: No Known Allergies  Medications Prior to Admission  Medication Sig Dispense Refill  . canagliflozin (INVOKANA) 300 MG  TABS tablet Take 300 mg by mouth every morning.    . linagliptin (TRADJENTA) 5 MG TABS tablet Take 5 mg by mouth every morning.     . Magnesium 250 MG TABS Take 1 tablet by mouth every morning.     . milk thistle 175 MG tablet Take 175 mg by mouth every morning.     Marland Kitchen OVER THE COUNTER MEDICATION Take 1,000 mg by mouth every morning. LIVER HEALTH COMPLEX    . pantoprazole (PROTONIX) 40 MG tablet Take 1 tablet (40 mg total) by mouth daily. (Patient taking differently: Take 40 mg by mouth every morning. ) 30 tablet 6  . Potassium  99 MG TABS Take 1 tablet by mouth every morning.     . propranolol (INDERAL) 20 MG tablet Take 20 mg by mouth every morning.    . propranolol (INDERAL) 20 MG tablet TAKE 1 TABLET (20 MG TOTAL) BY MOUTH DAILY. (Patient not taking: Reported on 05/29/2014) 30 tablet 9  . Vitamin D, Ergocalciferol, (DRISDOL) 50000 UNITS CAPS Take 50,000 Units by mouth every 7 (seven) days.       No results found for this or any previous visit (from the past 48 hour(s)). No results found.  Review of Systems  Constitutional: Positive for fever. Negative for chills, weight loss, malaise/fatigue and diaphoresis.  Eyes: Negative.   Respiratory: Negative.   Cardiovascular: Negative.   Gastrointestinal: Negative.   Genitourinary: Negative.   Musculoskeletal: Positive for neck pain. Negative for myalgias, back pain, joint pain and falls.  Skin: Negative.   Neurological: Negative.  Negative for weakness.  Endo/Heme/Allergies: Negative.   Psychiatric/Behavioral: Negative.     Blood pressure 119/54, pulse 72, temperature 98.7 F (37.1 C), temperature source Oral, resp. rate 19, weight 90.3 kg (199 lb 1.2 oz), SpO2 99 %. Physical Exam  Constitutional: He is oriented to person, place, and time. He appears well-developed and well-nourished.  HENT:  Head: Normocephalic and atraumatic.  Mouth/Throat: No oropharyngeal exudate.  Eyes: Conjunctivae and EOM are normal. Pupils are equal, round, and reactive to light. No scleral icterus.  Neck: Normal range of motion. Neck supple. No JVD present. No tracheal deviation present. No thyromegaly present.  Cardiovascular: Normal rate and regular rhythm.  Exam reveals no gallop and no friction rub.   No murmur heard. Respiratory: Effort normal and breath sounds normal. No respiratory distress. He has no wheezes. He has no rales.  GI: Soft. Bowel sounds are normal. He exhibits no distension. There is no tenderness. There is no rebound and no guarding.  Musculoskeletal: Normal  range of motion. He exhibits no edema or tenderness.  Lymphadenopathy:    He has no cervical adenopathy.  Neurological: He is alert and oriented to person, place, and time. He has normal reflexes. He displays normal reflexes. No cranial nerve deficit. He exhibits normal muscle tone. Coordination normal.  Skin: Skin is warm and dry. No rash noted. No erythema.  Psychiatric: He has a normal mood and affect. His behavior is normal. Judgment and thought content normal.     Assessment/Plan Neck pain Check MRI with and without contrast r/o osteomyeltiis Check esr, check crp Start  On vanco, levaquin iv  Fever Check cbc,cmp, u/a, blood culture x2 Check CXR  Dm2 fsbs ac and qhs, iss Cont invokana  Cirrhosis Cont current medications  Thrombocytopneia? Check cbc  DVT prophylaxis: scd, til we find out what his platelet count is     Jani Gravel 10/20/2014, 10:40 PM

## 2014-10-21 ENCOUNTER — Inpatient Hospital Stay (HOSPITAL_COMMUNITY): Payer: Managed Care, Other (non HMO)

## 2014-10-21 ENCOUNTER — Encounter (HOSPITAL_COMMUNITY): Payer: Self-pay | Admitting: Radiology

## 2014-10-21 DIAGNOSIS — B955 Unspecified streptococcus as the cause of diseases classified elsewhere: Secondary | ICD-10-CM

## 2014-10-21 DIAGNOSIS — G061 Intraspinal abscess and granuloma: Principal | ICD-10-CM

## 2014-10-21 DIAGNOSIS — M542 Cervicalgia: Secondary | ICD-10-CM

## 2014-10-21 DIAGNOSIS — R509 Fever, unspecified: Secondary | ICD-10-CM

## 2014-10-21 DIAGNOSIS — R7881 Bacteremia: Secondary | ICD-10-CM

## 2014-10-21 LAB — CBC WITH DIFFERENTIAL/PLATELET
BASOS ABS: 0 10*3/uL (ref 0.0–0.1)
Basophils Relative: 0 % (ref 0–1)
EOS PCT: 0 % (ref 0–5)
Eosinophils Absolute: 0 10*3/uL (ref 0.0–0.7)
HEMATOCRIT: 39 % (ref 39.0–52.0)
HEMOGLOBIN: 13.2 g/dL (ref 13.0–17.0)
LYMPHS ABS: 0.8 10*3/uL (ref 0.7–4.0)
Lymphocytes Relative: 5 % — ABNORMAL LOW (ref 12–46)
MCH: 30.2 pg (ref 26.0–34.0)
MCHC: 33.8 g/dL (ref 30.0–36.0)
MCV: 89.2 fL (ref 78.0–100.0)
Monocytes Absolute: 1.2 10*3/uL — ABNORMAL HIGH (ref 0.1–1.0)
Monocytes Relative: 7 % (ref 3–12)
Neutro Abs: 14.7 10*3/uL — ABNORMAL HIGH (ref 1.7–7.7)
Neutrophils Relative %: 88 % — ABNORMAL HIGH (ref 43–77)
Platelets: 24 10*3/uL — CL (ref 150–400)
RBC: 4.37 MIL/uL (ref 4.22–5.81)
RDW: 15.6 % — ABNORMAL HIGH (ref 11.5–15.5)
WBC MORPHOLOGY: INCREASED
WBC: 16.7 10*3/uL — AB (ref 4.0–10.5)

## 2014-10-21 LAB — COMPREHENSIVE METABOLIC PANEL
ALBUMIN: 2.1 g/dL — AB (ref 3.5–5.2)
ALK PHOS: 99 U/L (ref 39–117)
ALT: 19 U/L (ref 0–53)
AST: 30 U/L (ref 0–37)
Anion gap: 15 (ref 5–15)
BILIRUBIN TOTAL: 3.5 mg/dL — AB (ref 0.3–1.2)
BUN: 23 mg/dL (ref 6–23)
CHLORIDE: 96 mmol/L (ref 96–112)
CO2: 20 mmol/L (ref 19–32)
CREATININE: 0.89 mg/dL (ref 0.50–1.35)
Calcium: 8.6 mg/dL (ref 8.4–10.5)
GFR calc Af Amer: >90 mL/min (ref >90)
GFR calc non Af Amer: 90 mL/min — ABNORMAL LOW (ref >90)
Glucose, Bld: 156 mg/dL — ABNORMAL HIGH (ref 70–99)
POTASSIUM: 4.6 mmol/L (ref 3.5–5.1)
Sodium: 131 mmol/L — ABNORMAL LOW (ref 135–145)
TOTAL PROTEIN: 7.2 g/dL (ref 6.0–8.3)

## 2014-10-21 LAB — PROTIME-INR
INR: 1.38 (ref 0.00–1.49)
Prothrombin Time: 17.1 seconds — ABNORMAL HIGH (ref 11.6–15.2)

## 2014-10-21 LAB — GLUCOSE, CAPILLARY
GLUCOSE-CAPILLARY: 185 mg/dL — AB (ref 70–99)
GLUCOSE-CAPILLARY: 217 mg/dL — AB (ref 70–99)
Glucose-Capillary: 138 mg/dL — ABNORMAL HIGH (ref 70–99)

## 2014-10-21 LAB — C-REACTIVE PROTEIN: CRP: 27.4 mg/dL — ABNORMAL HIGH (ref <0.60)

## 2014-10-21 LAB — SEDIMENTATION RATE: Sed Rate: 62 mm/hr — ABNORMAL HIGH (ref 0–16)

## 2014-10-21 MED ORDER — VANCOMYCIN HCL 10 G IV SOLR
1250.0000 mg | Freq: Two times a day (BID) | INTRAVENOUS | Status: DC
  Administered 2014-10-21: 1250 mg via INTRAVENOUS
  Filled 2014-10-21 (×2): qty 1250

## 2014-10-21 MED ORDER — BOOST / RESOURCE BREEZE PO LIQD
1.0000 | Freq: Three times a day (TID) | ORAL | Status: DC
  Administered 2014-10-21 – 2014-10-23 (×6): 1 via ORAL

## 2014-10-21 MED ORDER — CEFTRIAXONE SODIUM IN DEXTROSE 40 MG/ML IV SOLN
2.0000 g | Freq: Two times a day (BID) | INTRAVENOUS | Status: DC
  Administered 2014-10-21 – 2014-10-23 (×5): 2 g via INTRAVENOUS
  Filled 2014-10-21 (×7): qty 50

## 2014-10-21 MED ORDER — HYDROMORPHONE HCL 1 MG/ML IJ SOLN
1.0000 mg | Freq: Once | INTRAMUSCULAR | Status: AC
  Administered 2014-10-21: 1 mg via INTRAVENOUS
  Filled 2014-10-21: qty 1

## 2014-10-21 MED ORDER — CEFTRIAXONE SODIUM IN DEXTROSE 40 MG/ML IV SOLN: 2.0000 g | INTRAVENOUS | Status: DC

## 2014-10-21 MED ORDER — MORPHINE SULFATE 2 MG/ML IJ SOLN
1.0000 mg | INTRAMUSCULAR | Status: DC | PRN
  Administered 2014-10-21 – 2014-10-25 (×15): 1 mg via INTRAVENOUS
  Filled 2014-10-21 (×15): qty 1

## 2014-10-21 MED ORDER — SODIUM CHLORIDE 0.9 % IV SOLN
INTRAVENOUS | Status: DC
  Administered 2014-10-21 – 2014-10-22 (×2): via INTRAVENOUS

## 2014-10-21 MED ORDER — LEVOFLOXACIN IN D5W 750 MG/150ML IV SOLN
750.0000 mg | Freq: Every day | INTRAVENOUS | Status: DC
  Administered 2014-10-21: 750 mg via INTRAVENOUS
  Filled 2014-10-21 (×2): qty 150

## 2014-10-21 MED ORDER — LEVOFLOXACIN IN D5W 500 MG/100ML IV SOLN
500.0000 mg | Freq: Every day | INTRAVENOUS | Status: DC
  Administered 2014-10-21: 500 mg via INTRAVENOUS
  Filled 2014-10-21 (×2): qty 100

## 2014-10-21 MED ORDER — ACETAMINOPHEN 325 MG PO TABS: 650.0000 mg | ORAL_TABLET | Freq: Four times a day (QID) | ORAL | Status: DC | PRN

## 2014-10-21 MED ORDER — HYDROMORPHONE HCL 1 MG/ML IJ SOLN
0.5000 mg | INTRAMUSCULAR | Status: DC | PRN
  Administered 2014-10-21: 0.5 mg via INTRAVENOUS
  Filled 2014-10-21: qty 1

## 2014-10-21 MED ORDER — GADOBENATE DIMEGLUMINE 529 MG/ML IV SOLN
19.0000 mL | Freq: Once | INTRAVENOUS | Status: AC | PRN
  Administered 2014-10-21: 19 mL via INTRAVENOUS

## 2014-10-21 MED ORDER — IOHEXOL 300 MG/ML  SOLN
75.0000 mL | Freq: Once | INTRAMUSCULAR | Status: AC | PRN
  Administered 2014-10-21: 75 mL via INTRAVENOUS

## 2014-10-21 NOTE — Progress Notes (Signed)
NURSING PROGRESS NOTE  ARVAL BRANDSTETTER [id number] Admission Data: 10/21/2014 7:58 AM Attending Provider: Delfina Redwood, MD JDB:ZMCEYE, MILLARD, MD Code Status: Full  KELLAR WESTBERG is a 64 y.o. male patient admitted from ED:  -No acute distress noted.  -No complaints of shortness of breath.  -No complaints of chest pain.   Cardiac Monitoring: Box # 20 in place. Cardiac monitor yields:sinus bradycardia.  Blood pressure 119/54, pulse 72, temperature 98.7 F, temperature source Oral, resp. rate 19, height 5\' 11"  (1.803 m), weight 88.6 kg (195 lb 5.2 oz), SpO2 99 % RA.  IV Fluids:  IV in place, occlusive dsg intact without redness, IV cath forearm left, condition patent and no redness none.   Allergies:  Review of patient's allergies indicates no known allergies.  Past Medical History:   has a past medical history of Reflux esophagitis; Portal hypertension; Stricture and stenosis of esophagus; Diverticulosis of colon (without mention of hemorrhage); Internal hemorrhoids without mention of complication; Benign neoplasm of colon; Duodenitis without mention of hemorrhage; Esophageal varices without mention of bleeding; Personal history of diseases of blood and blood-forming organs; Cirrhosis of liver without mention of alcohol; Unspecified essential hypertension; Obesity, unspecified; Type II or unspecified type diabetes mellitus without mention of complication, not stated as uncontrolled; GERD (gastroesophageal reflux disease); Hemorrhoids; Colon polyps; Pancytopenia; and Calculus of kidney (yrs ago).  Past Surgical History:   has past surgical history that includes Kidney stone surgery (yrs ago); Esophagogastroduodenoscopy (08/01/2011); Esophagogastroduodenoscopy (N/A, 10/27/2012); Esophagogastroduodenoscopy (N/A, 11/22/2012); Colonoscopy; Upper gastrointestinal endoscopy; Esophagogastroduodenoscopy (N/A, 11/29/2013); esophageal banding (Bilateral, 11/29/2013); Esophagogastroduodenoscopy (N/A,  06/13/2014); and esophageal banding (N/A, 06/13/2014).  Social History:   reports that he has never smoked. He has never used smokeless tobacco. He reports that he does not drink alcohol or use illicit drugs.  Skin: intact  Patient/Family orientated to room. Information packet given to patient/family. Admission inpatient armband information verified with patient/family to include name and date of birth and placed on patient arm. Side rails up x 2, fall assessment and education completed with patient/family. Patient/family able to verbalize understanding of risk associated with falls and verbalized understanding to call for assistance before getting out of bed. Call light within reach. Patient/family able to voice and demonstrate understanding of unit orientation instructions.

## 2014-10-21 NOTE — Progress Notes (Signed)
Patient ID: [name], male   DOB: [birth year], 64 y.o.   MRN: [id number]  Spinal epidural abscess per radiology Neurosurgery consulted

## 2014-10-21 NOTE — Progress Notes (Signed)
Patient admitted after midnight.  Chart reviewed. Patient examined. Patient is still very somnolent. Avoid sedating medications. Check lactulose. I called [facility] [phone] regarding specimen [phone]. Still just preliminary results recovered from aerobic and anaerobic bottles, gram-positive cocci in pairs and chains. I left my number and they will call me when results are final.  CT brain shows no stroke. Patient currently on IV vancomycin and levofloxacin.  ID aware. Updated wife. Appreciate consultants.  Doree Barthel, MD [facility] [email or web address] password [facility] Pager space [phone]

## 2014-10-21 NOTE — Progress Notes (Signed)
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   Labs from [place]:  WBC 17.9 Hgb 12.1 SCr 0.8  Medications at [facility]: Vancomycin 1750 mg IV x 1 Cefepime x 1  Goal of Therapy:  Vancomycin trough level 15-20 mcg/ml  Plan:  -Vancomycin 1250 mg IV q12h -Levaquin per MD -Trend WBC, temp, renal function  -Drug levels as indicated   Narda Bonds 10/21/2014,12:42 AM

## 2014-10-21 NOTE — Consult Note (Signed)
Reason for Consult: Cervical epidural abscess Referring Physician: Bonne Dolores hospitalist  [name] is an 64 y.o. male.  HPI: Patient is a 64 year old gentleman with multiple medical problems to include cirrhosis and subsequent coagulopathy diabetes coronary disease who presented with neck pain that's been going on for approximately 8 or 9 days workup with plain films and subsequent blood cultures and MRI scan as shown a cervical epidural abscess blood cultures were positive for gram-positive cocci patient was transferred up from [facility] the medical service for evaluation management. Patient's predominant complaints are neck pain he denies any numbness or tingling in his arms or his legs patient initially denied any weakness although his wife does say these had a little more difficulty getting up and getting around lately been he had prior to 4 days ago.  Past Medical History  Diagnosis Date  . Reflux esophagitis   . Portal hypertension   . Stricture and stenosis of esophagus   . Diverticulosis of colon (without mention of hemorrhage)   . Internal hemorrhoids without mention of complication   . Benign neoplasm of colon   . Duodenitis without mention of hemorrhage   . 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 Personal history of diseases of blood and blood-forming organs   . Cirrhosis of liver without mention of alcohol   . Unspecified essential hypertension   . Obesity, unspecified   . Type II or unspecified type diabetes mellitus without mention of complication, not stated as uncontrolled   . GERD (gastroesophageal reflux disease)   . Hemorrhoids   . Colon polyps   . Pancytopenia   . Calculus of kidney yrs ago    Past Surgical History  Procedure Laterality Date  . Kidney stone surgery  yrs ago  . Esophagogastroduodenoscopy  08/01/2011    Procedure: ESOPHAGOGASTRODUODENOSCOPY (EGD);  Surgeon: Scarlette Shorts, MD;  Location: [facility];  Service: Endoscopy;   Laterality: N/A;  . Esophagogastroduodenoscopy N/A 10/27/2012    Procedure: ESOPHAGOGASTRODUODENOSCOPY (EGD);  Surgeon: Inda Castle, MD;  Location: [facility];  Service: Endoscopy;  Laterality: N/A;  . Esophagogastroduodenoscopy N/A 11/22/2012    Procedure: ESOPHAGOGASTRODUODENOSCOPY (EGD);  Surgeon: Irene Shipper, MD;  Location: [facility];  Service: Endoscopy;  Laterality: N/A;  . Colonoscopy    . Upper gastrointestinal endoscopy    . Esophagogastroduodenoscopy N/A 11/29/2013    Procedure: ESOPHAGOGASTRODUODENOSCOPY (EGD);  Surgeon: Irene Shipper, MD;  Location: [facility];  Service: Endoscopy;  Laterality: N/A;  . Esophageal banding Bilateral 11/29/2013    Procedure: ESOPHAGEAL BANDING;  Surgeon: Irene Shipper, MD;  Location: [facility];  Service: Endoscopy;  Laterality: Bilateral;  . Esophagogastroduodenoscopy N/A 06/13/2014    Procedure: ESOPHAGOGASTRODUODENOSCOPY (EGD);  Surgeon: Irene Shipper, MD;  Location: [facility];  Service: Endoscopy;  Laterality: N/A;  . Esophageal banding N/A 06/13/2014    Procedure: ESOPHAGEAL BANDING;  Surgeon: Irene Shipper, MD;  Location: [facility];  Service: Endoscopy;  Laterality: N/A;    Family History  Problem Relation Age of Onset  . Colon cancer Neg Hx   . Anesthesia problems Neg Hx   . Hypotension Neg Hx   . Malignant hyperthermia Neg Hx   . Pseudochol deficiency Neg Hx   . Diabetes Father   . Heart disease Father   . Aneurysm Mother   . Heart disease Other     neice  . Diabetes      niece    Social History:  reports that he has never smoked. He has  never used smokeless tobacco. He reports that he does not drink alcohol or use illicit drugs.  Allergies: No Known Allergies  Medications: I have reviewed the patient's current medications.  Results for orders placed or performed during the hospital encounter of 10/20/14 (from the past 48 hour(s))  Glucose, capillary     Status: Abnormal   Collection Time: 10/20/14 11:13 PM  Result  Value Ref Range   Glucose-Capillary 139 (H) 70 - 99 mg/dL   Comment 1 Notify RN    Comment 2 Document in Chart   CBC with Differential/Platelet     Status: Abnormal   Collection Time: 10/21/14  4:46 AM  Result Value Ref Range   WBC 16.7 (H) 4.0 - 10.5 K/uL   RBC 4.37 4.22 - 5.81 MIL/uL   Hemoglobin 13.2 13.0 - 17.0 g/dL   HCT 39.0 39.0 - 52.0 %   MCV 89.2 78.0 - 100.0 fL   MCH 30.2 26.0 - 34.0 pg   MCHC 33.8 30.0 - 36.0 g/dL   RDW 15.6 (H) 11.5 - 15.5 %   Platelets 24 (LL) 150 - 400 K/uL    Comment: REPEATED TO VERIFY PLATELET COUNT CONFIRMED BY SMEAR CRITICAL RESULT CALLED TO, READ BACK BY AND VERIFIED WITH: A. THOMPSON RN [id number] [phone] GREEN R    Neutrophils Relative % 88 (H) 43 - 77 %   Lymphocytes Relative 5 (L) 12 - 46 %   Monocytes Relative 7 3 - 12 %   Eosinophils Relative 0 0 - 5 %   Basophils Relative 0 0 - 1 %   Neutro Abs 14.7 (H) 1.7 - 7.7 K/uL   Lymphs Abs 0.8 0.7 - 4.0 K/uL   Monocytes Absolute 1.2 (H) 0.1 - 1.0 K/uL   Eosinophils Absolute 0.0 0.0 - 0.7 K/uL   Basophils Absolute 0.0 0.0 - 0.1 K/uL   WBC Morphology INCREASED BANDS (>20% BANDS)     Comment: TOXIC GRANULATION  Comprehensive metabolic panel     Status: Abnormal   Collection Time: 10/21/14  4:46 AM  Result Value Ref Range   Sodium 131 (L) 135 - 145 mmol/L   Potassium 4.6 3.5 - 5.1 mmol/L   Chloride 96 96 - 112 mmol/L   CO2 20 19 - 32 mmol/L   Glucose, Bld 156 (H) 70 - 99 mg/dL   BUN 23 6 - 23 mg/dL   Creatinine, Ser 0.89 0.50 - 1.35 mg/dL   Calcium 8.6 8.4 - 10.5 mg/dL   Total Protein 7.2 6.0 - 8.3 g/dL   Albumin 2.1 (L) 3.5 - 5.2 g/dL   AST 30 0 - 37 U/L   ALT 19 0 - 53 U/L   Alkaline Phosphatase 99 39 - 117 U/L   Total Bilirubin 3.5 (H) 0.3 - 1.2 mg/dL   GFR calc non Af Amer 90 (L) >90 mL/min   GFR calc Af Amer >90 >90 mL/min    Comment: (NOTE) The eGFR has been calculated using the CKD EPI equation. This calculation has not been validated in all clinical situations. eGFR's persistently  <90 mL/min signify possible Chronic Kidney Disease.    Anion gap 15 5 - 15  Sedimentation rate     Status: Abnormal   Collection Time: 10/21/14  4:46 AM  Result Value Ref Range   Sed Rate 62 (H) 0 - 16 mm/hr    Dg Chest 2 View  10/21/2014   CLINICAL DATA:  Fever  EXAM: CHEST  2 VIEW  COMPARISON:  10/20/2014  FINDINGS: Very  low lung volumes with streaky opacities over the diaphragm in the lateral projection. No edema or effusion. No overt pneumonia. Apparent cardiomegaly, although size is accentuated by technique.  IMPRESSION: Marked hypoinflation with bibasilar atelectasis.   Electronically Signed   By: Monte Fantasia M.D.   On: 10/21/2014 05:04   Mr Cervical Spine W Wo Contrast  10/21/2014   CLINICAL DATA:  Initial evaluation for 2 week history of neck pain 8. Fever. Recent bacteremia. Concern for possible infection. History of diabetes, cirrhosis.  EXAM: MRI CERVICAL SPINE WITHOUT AND WITH CONTRAST  TECHNIQUE: Multiplanar and multiecho pulse sequences of the cervical spine, to include the craniocervical junction and cervicothoracic junction, were obtained according to standard protocol without and with intravenous contrast.  CONTRAST:  68mL MULTIHANCE GADOBENATE DIMEGLUMINE 529 MG/ML IV SOLN  COMPARISON:  Prior radiograph from 10/19/2014  FINDINGS: Visualized portions of the brain itself are normal. There is abnormal dural enhancement extending cephalad at the level of the foramen magnum. Craniocervical junction within normal limits.  Trace anterior listhesis of C6 on C7. Otherwise, vertebral bodies are normally aligned with preservation of the normal cervical lordosis. Vertebral body heights are preserved. Signal intensity within the vertebral body bone marrow is normal. No evidence for osteomyelitis discitis. No prevertebral edema.  Signal intensity within the cervical spinal cord is within normal limits, although evaluation is somewhat limited due to motion artifact on this exam.  There is a in  T2 hyperintense rim enhancing collection predominant located within the left posterolateral epidural space of the cervical spinal cord (series 17, image 13). This collection essentially begins at day foramen magnum/level of C1, and extends inferiorly to approximately the level of C6. Collection is most evident within the left posterolateral epidural space at the level of C5 where it measures 6 x 9 mm (series 17, image 16). There is abnormal enhancement with phlegmonous change throughout the left lateral and posterior epidural space extending from the level of C1 inferiorly to approximately C6. Abscess wraps around into the ventral epidural space at the level of C2 (series 17, image 3). Additional abscess appears to extend through the left neural foramen at C5-6 (Series 17, image 18). There is associated moderate to severe diffuse canal stenosis with the cervical spinal cord somewhat flattened and displaced to the right. Stenosis is most severe at the C4-5 level. Again, there are no definite cord signal changes.  There is abnormal enhancement throughout the posterior paraspinous musculature, extending from the base of the occiput inferiorly to approximately the level of C7. There are multi focal irregular abscesses within the paraspinous musculature (series 15, image 14). These are predominantly within the left paraspinous musculature. For reference purposes, the most prominent collection measures approximately 8 x 16 x 18 mm at the level of C6-7 (series 15, image 13). Additional phlegmon/abscess located more superiorly within the left paraspinous musculature just beneath the occiput.  C2-3: Bilateral uncovertebral spurring with mild bilateral facet hypertrophy. Mild stenosis related to the epidural abscess.  C3-4: Bilateral uncovertebral hypertrophy with facet arthrosis. Resultant mild left foraminal narrowing. Mild canal stenosis related to the epidural abscess.  C4-5: Mild diffuse degenerative disc osteophyte with  bilateral uncovertebral spurring and facet arthrosis. Superimposed right paracentral/foraminal disc osteophyte complex. Moderate canal stenosis related to degenerative changes and epidural abscess. Moderate right with more mild left foraminal narrowing.  C5-6: Mild uncovertebral hypertrophy with facet arthrosis. Moderate canal stenosis related epidural abscess. Again, abscess appears to extend through the left C5-6 neural foramen.  C6-7: Trace anterolisthesis with  associated posterior disc bulge. Mild canal stenosis. Mild bilateral facet arthrosis present. No significant foraminal narrowing.  C7-T1: Central disc protrusion with mild bilateral uncovertebral hypertrophy. Resultant mild canal stenosis. Foramina are grossly patent.  IMPRESSION: 1. Epidural abscess with enhancing phlegmonous change extending from the level of C1 inferiorly to approximately the level of C6-7 as detailed above. Abscess is predominantly located within the left lateral and left posterolateral epidural space, but does extend anteriorly into the ventral epidural space at the level of C2. There is extension through the left C5-6 neural foramen as well. There is secondary moderate to severe diffuse canal stenosis with flattening and mild rightward displacement of the cervical spinal cord, most prevalent at the C4-5 level. No cord signal changes. 2. Extensive abnormal enhancement with multi focal abscesses involving the posterior paraspinous musculature, predominantly on the left. 3. Abnormal dural enhancement extending superiorly through the foramen magnum. 4. No evidence for osteomyelitis discitis. 5. Multilevel degenerative disc disease as above. Results were called by telephone at the time of interpretation on 10/21/2014 at 2:08 am to Dr. Jani Gravel , who verbally acknowledged these results.   Electronically Signed   By: Jeannine Boga M.D.   On: 10/21/2014 02:14    Review of Systems  Constitutional: Positive for fever, chills and  weight loss.  Musculoskeletal: Positive for myalgias, joint pain and neck pain.  Neurological: Positive for headaches.   Blood pressure 154/96, pulse 72, temperature 99.7 F (37.6 C), temperature source Oral, resp. rate 19, height $RemoveBe'5\' 11"'[id number]$  (1.803 m), weight 88.6 kg (195 lb 5.2 oz), SpO2 98 %. Physical Exam  Neurological: GCS eye subscore is 4. GCS verbal subscore is 5. GCS motor subscore is 6.  Patient is awake and alert he seems mildly encephalopathic difficult to follow simple commands strength in the right upper extremity is 5 out of 5 in his deltoid, bicep, tricep, wrist flexion, wrist extension, and intrinsics in the left upper extremity he has some trace weakness in his left tricep but 4 out of 5 biceps 4+ out of 5 hand intrinsics are 4+ out of 5 in lower extremity right lower extremity is 5 out of 5 throughout his iliopsoas quads hamstrings gastrocs and EHL left lower extremity difficult to assess may have some slight weakness of 4+ out of 5 but very difficult to get the patient to comply with exam he seems to have somewhat of a left-sided neglect    Assessment/Plan: 64 year old gentleman with a cervical epidural abscess that is diffuse and confluence from his foramen magnum down to C7 there is a mild-to-moderate amount stenosis on the left side of his spinal canal abutting his cord from C3 down to C6. Patient is an extremely high risk for surgery with his multiple medical comorbidities not the least of which is his cirrhosis and his coagulopathy. I would certainly favor trying to manage this with IV antibiotics as we were reidentified A bacteria in his blood. Consideration can be given to aspiration of the paraspinal musculature if there is a focal fluid collection the radiologist feel like they could CT and aspirate. Patient is currently on IV vancomycin would recommend ID consult to ensure that we don't need to brought Ms. coverage. Due to patient's encephalopathy and somewhat of his left-sided  neglect I do think it's worth while to CT scan his head. I have ordered a PT and INR and I will order the CT of his head and we will continue to follow in the hospital with you.  Cortland Crehan  P 10/21/2014, 7:41 AM

## 2014-10-21 NOTE — Progress Notes (Signed)
INITIAL NUTRITION ASSESSMENT  DOCUMENTATION CODES Per approved criteria  -Not Applicable   INTERVENTION Resource Breeze po TID, each supplement provides 250 kcal and 9 grams of protein   RD will continue to follow nutrition care  NUTRITION DIAGNOSIS: Increased protein needs related to cirrhosis as evidenced by current standards of care     Goal: Pt to meet >/= 90% of their estimated nutrition needs    Monitor:  Po intake, labs and wt trends   Reason for Assessment: Malnutrition Screen   64 y.o. male   ASSESSMENT: Pt presents with cervical epidural abscess. His hx includes cirrhosis, esophageal varices, stricture and stenosis of esophagus, diverticulosis and type II diabetes. Pt is sleeping but his wife is present and provided nutrition hx. Very good appetite and consistent meal intake until 3 days ago. Hx includes distant wt loss which she attributes to pt DM medication. His diet has advanced to clears will add Resource Breeze due to his recent poor po intake and re-assess indication when diet is fully progressed.  Abnormal labs: sodium and glucose  Nutrition focused exam: deferred at this time   Height: Ht Readings from Last 1 Encounters:  10/20/14 5\' 11"  (1.803 m)    Weight: Wt Readings from Last 1 Encounters:  10/21/14 195 lb 5.2 oz (88.6 kg)    Ideal Body Weight: 172# (78 kg)  % Ideal Body Weight: 113%  Wt Readings from Last 10 Encounters:  10/21/14 195 lb 5.2 oz (88.6 kg)  06/13/14 196 lb (88.905 kg)  02/07/14 198 lb (89.812 kg)  11/22/13 198 lb (89.812 kg)  02/23/13 221 lb 12.8 oz (100.608 kg)  11/19/12 214 lb 8 oz (97.297 kg)  10/27/12 222 lb 7.1 oz (100.9 kg)  03/31/12 225 lb (102.059 kg)  02/24/12 225 lb 9.6 oz (102.331 kg)  08/01/11 212 lb (96.163 kg)    Usual Body Weight: 195-198# (past year)  % Usual Body Weight: 100%  BMI:  Body mass index is 27.25 kg/(m^2). overweight  Estimated Nutritional Needs: Kcal: 2229-7989 Protein: 105 gr   Fluid: 2.2-2.3 liters daily  Skin: intact  Diet Order: Diet NPO time specified  EDUCATION NEEDS: -Education not appropriate at this time   Intake/Output Summary (Last 24 hours) at 10/21/14 1028 Last data filed at 10/21/14 0615  Gross per 24 hour  Intake      0 ml  Output    500 ml  Net   -500 ml    Last BM: prior to admission  Labs:   Recent Labs Lab 10/21/14 0446  NA 131*  K 4.6  CL 96  CO2 20  BUN 23  CREATININE 0.89  CALCIUM 8.6  GLUCOSE 156*    CBG (last 3)   Recent Labs  10/20/14 2313  GLUCAP 139*    Scheduled Meds: . canagliflozin  300 mg Oral q morning - 10a  . insulin aspart  0-5 Units Subcutaneous QHS  . insulin aspart  0-9 Units Subcutaneous TID WC  . levofloxacin (LEVAQUIN) IV  500 mg Intravenous QHS  . linagliptin  5 mg Oral q morning - 10a  . magnesium oxide  400 mg Oral q morning - 10a  . pantoprazole  40 mg Oral q morning - 10a  . propranolol  20 mg Oral q morning - 10a  . sodium chloride  3 mL Intravenous Q12H  . sodium chloride  3 mL Intravenous Q12H  . vancomycin  1,250 mg Intravenous Q12H  . [START ON 10/23/2014] Vitamin D (Ergocalciferol)  50,000  Units Oral Q Mon    Continuous Infusions:   Past Medical History  Diagnosis Date  . Reflux esophagitis   . Portal hypertension   . Stricture and stenosis of esophagus   . Diverticulosis of colon (without mention of hemorrhage)   . Internal hemorrhoids without mention of complication   . Benign neoplasm of colon   . Duodenitis without mention of hemorrhage   . 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 Personal history of diseases of blood and blood-forming organs   . Cirrhosis of liver without mention of alcohol   . Unspecified essential hypertension   . Obesity, unspecified   . Type II or unspecified type diabetes mellitus without mention of complication, not stated as uncontrolled   . GERD (gastroesophageal reflux disease)   . Hemorrhoids   . Colon polyps   .  Pancytopenia   . Calculus of kidney yrs ago    Past Surgical History  Procedure Laterality Date  . Kidney stone surgery  yrs ago  . Esophagogastroduodenoscopy  08/01/2011    Procedure: ESOPHAGOGASTRODUODENOSCOPY (EGD);  Surgeon: Scarlette Shorts, MD;  Location: [facility];  Service: Endoscopy;  Laterality: N/A;  . Esophagogastroduodenoscopy N/A 10/27/2012    Procedure: ESOPHAGOGASTRODUODENOSCOPY (EGD);  Surgeon: Inda Castle, MD;  Location: [facility];  Service: Endoscopy;  Laterality: N/A;  . Esophagogastroduodenoscopy N/A 11/22/2012    Procedure: ESOPHAGOGASTRODUODENOSCOPY (EGD);  Surgeon: Irene Shipper, MD;  Location: [facility];  Service: Endoscopy;  Laterality: N/A;  . Colonoscopy    . Upper gastrointestinal endoscopy    . Esophagogastroduodenoscopy N/A 11/29/2013    Procedure: ESOPHAGOGASTRODUODENOSCOPY (EGD);  Surgeon: Irene Shipper, MD;  Location: [facility];  Service: Endoscopy;  Laterality: N/A;  . Esophageal banding Bilateral 11/29/2013    Procedure: ESOPHAGEAL BANDING;  Surgeon: Irene Shipper, MD;  Location: [facility];  Service: Endoscopy;  Laterality: Bilateral;  . Esophagogastroduodenoscopy N/A 06/13/2014    Procedure: ESOPHAGOGASTRODUODENOSCOPY (EGD);  Surgeon: Irene Shipper, MD;  Location: [facility];  Service: Endoscopy;  Laterality: N/A;  . Esophageal banding N/A 06/13/2014    Procedure: ESOPHAGEAL BANDING;  Surgeon: Irene Shipper, MD;  Location: [facility];  Service: Endoscopy;  Laterality: N/A;    Colman Cater MS,RD,CSG,LDN Office: [phone] Pager: [phone]

## 2014-10-21 NOTE — Progress Notes (Signed)
D/w IR, nothing safe to aspirate. Full note to follow.  Need to track down Blood cx.  Doree Barthel, MD [facility]

## 2014-10-21 NOTE — Progress Notes (Addendum)
CRITICAL VALUE ALERT  Critical value received: platelets 24  Date of notification: 10/21/14  Time of notification: 0653  Critical value read back:Yes.    Nurse who received alert:  Hyman Crossan, RN  MD notified (1st page):  Donnal Debar, NP Time of first page: [phone]  MD notified (2nd page):  Time of second page:   Responding MD:  Donnal Debar,, NP  Time MD responded: [phone]

## 2014-10-21 NOTE — Consult Note (Signed)
[facility]    Date of Admission:  10/20/2014  Date of Consult:  10/21/2014  Reason for Consult: Epidural abscess and apparent streptococcal bacteremia Referring Physician: Dr. Lendell Caprice   HPI: [name] is an 64 y.o. male with multiple medical problems including diabetes mellitus, cirrhosis thought to be due to [facility], who had been feeling poorly for the last 2 weeks with fever and had seen his primary care physician who drawn 2 sets of blood cultures which of separately grown gram-positive cocci in pairs and chains. Patient went to the emerge department [facility] was concern for  infection the spine. Patient was sent [facility] for evaluation an MRI here has shown  1. Epidural abscess with enhancing phlegmonous change extending from the level of C1 inferiorly to approximately the level of C6-7 as detailed above. Abscess is predominantly located within the left lateral and left posterolateral epidural space, but does extend anteriorly into the ventral epidural space at the level of C2. There is extension through the left C5-6 neural foramen as well. There is secondary moderate to severe diffuse canal stenosis with flattening and mild rightward displacement of the cervical spinal cord, most prevalent at the C4-5 level. No cord signal changes. 2. Extensive abnormal enhancement with multi focal abscesses involving the posterior paraspinous musculature, predominantly on the left. 3. Abnormal dural enhancement extending superiorly through the foramen magnum. 4. No evidence for osteomyelitis discitis. 5. Multilevel degenerative disc disease as above  He was started on vancomycin and levofloxacin here has been seen by Dr. Wynetta Emery with neurosurgery. Pt has  Some ? Left sided neglect on exam and has some encephalopathy and confusion. CT of the head not show any evidence of abscess in the brain on imaging.  Past Medical History  Diagnosis Date    . Reflux esophagitis   . Portal hypertension   . Stricture and stenosis of esophagus   . Diverticulosis of colon (without mention of hemorrhage)   . Internal hemorrhoids without mention of complication   . Benign neoplasm of colon   . Duodenitis without mention of hemorrhage   . 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 Personal history of diseases of blood and blood-forming organs   . Cirrhosis of liver without mention of alcohol   . Unspecified essential hypertension   . Obesity, unspecified   . Type II or unspecified type diabetes mellitus without mention of complication, not stated as uncontrolled   . GERD (gastroesophageal reflux disease)   . Hemorrhoids   . Colon polyps   . Pancytopenia   . Calculus of kidney yrs ago    Past Surgical History  Procedure Laterality Date  . Kidney stone surgery  yrs ago  . Esophagogastroduodenoscopy  08/01/2011    Procedure: ESOPHAGOGASTRODUODENOSCOPY (EGD);  Surgeon: Yancey Flemings, MD;  Location: [facility];  Service: Endoscopy;  Laterality: N/A;  . Esophagogastroduodenoscopy N/A 10/27/2012    Procedure: ESOPHAGOGASTRODUODENOSCOPY (EGD);  Surgeon: Louis Meckel, MD;  Location: [facility];  Service: Endoscopy;  Laterality: N/A;  . Esophagogastroduodenoscopy N/A 11/22/2012    Procedure: ESOPHAGOGASTRODUODENOSCOPY (EGD);  Surgeon: Hilarie Fredrickson, MD;  Location: [facility];  Service: Endoscopy;  Laterality: N/A;  . Colonoscopy    . Upper gastrointestinal endoscopy    . Esophagogastroduodenoscopy N/A 11/29/2013    Procedure: ESOPHAGOGASTRODUODENOSCOPY (EGD);  Surgeon: Hilarie Fredrickson, MD;  Location: [facility];  Service: Endoscopy;  Laterality: N/A;  . Esophageal banding Bilateral 11/29/2013    Procedure: ESOPHAGEAL  BANDING;  Surgeon: Irene Shipper, MD;  Location: [facility];  Service: Endoscopy;  Laterality: Bilateral;  . Esophagogastroduodenoscopy N/A 06/13/2014    Procedure: ESOPHAGOGASTRODUODENOSCOPY (EGD);  Surgeon: Irene Shipper, MD;   Location: [facility];  Service: Endoscopy;  Laterality: N/A;  . Esophageal banding N/A 06/13/2014    Procedure: ESOPHAGEAL BANDING;  Surgeon: Irene Shipper, MD;  Location: [facility];  Service: Endoscopy;  Laterality: N/A;  ergies:   No Known Allergies   Medications: I have reviewed patients current medications as documented in Epic Anti-infectives    Start     Dose/Rate Route Frequency Ordered Stop   10/21/14 2200  levofloxacin (LEVAQUIN) IVPB 750 mg     750 mg 100 mL/hr over 90 Minutes Intravenous Daily at bedtime 10/21/14 1105     10/21/14 1400  cefTRIAXone (ROCEPHIN) 2 g in dextrose 5 % 50 mL IVPB - Premix     2 g 100 mL/hr over 30 Minutes Intravenous Every 24 hours 10/21/14 1358     10/21/14 0600  vancomycin (VANCOCIN) 1,250 mg in sodium chloride 0.9 % 250 mL IVPB  Status:  Discontinued     1,250 mg 166.7 mL/hr over 90 Minutes Intravenous Every 12 hours 10/21/14 0137 10/21/14 1358   10/21/14 0130  levofloxacin (LEVAQUIN) IVPB 500 mg  Status:  Discontinued     500 mg 100 mL/hr over 60 Minutes Intravenous Daily at bedtime 10/21/14 0030 10/21/14 1105      Social History:  reports that he has never smoked. He has never used smokeless tobacco. He reports that he does not drink alcohol or use illicit drugs.  Family History  Problem Relation Age of Onset  . Colon cancer Neg Hx   . Anesthesia problems Neg Hx   . Hypotension Neg Hx   . Malignant hyperthermia Neg Hx   . Pseudochol deficiency Neg Hx   . Diabetes Father   . Heart disease Father   . Aneurysm Mother   . Heart disease Other     neice  . Diabetes      niece    As in HPI and primary teams notes otherwise 12 point review of systems is negative  Blood pressure 154/96, pulse 72, temperature 99.7 F (37.6 C), temperature source Oral, resp. rate 19, height $RemoveBe'5\' 11"'PDgCxxYnc$  (1.803 m), weight 195 lb 5.2 oz (88.6 kg), SpO2 98 %. General: Sleepy lying on his side arousable but confused  HEENT: anicteric sclera, pupils reactive to  light and accommodation, EOMI, oropharynx clear and without exudate CVS regular rate, normal r,  no murmur rubs or gallops Chest: clear to auscultation bilaterally, no wheezing, rales or rhonchi Abdomen: soft nontender, nondistended, normal bowel sounds, Extremities: no  clubbing or edema noted bilaterally Skin: Has chronic hyperpigmented lesions on his left leg  Neuro: ? ZLUE weakness, otherwise nonfocal   Results for orders placed or performed during the hospital encounter of 10/20/14 (from the past 48 hour(s))  Glucose, capillary     Status: Abnormal   Collection Time: 10/20/14 11:13 PM  Result Value Ref Range   Glucose-Capillary 139 (H) 70 - 99 mg/dL   Comment 1 Notify RN    Comment 2 Document in Chart   CBC with Differential/Platelet     Status: Abnormal   Collection Time: 10/21/14  4:46 AM  Result Value Ref Range   WBC 16.7 (H) 4.0 - 10.5 K/uL   RBC 4.37 4.22 - 5.81 MIL/uL   Hemoglobin 13.2 13.0 - 17.0 g/dL   HCT 39.0  39.0 - 52.0 %   MCV 89.2 78.0 - 100.0 fL   MCH 30.2 26.0 - 34.0 pg   MCHC 33.8 30.0 - 36.0 g/dL   RDW 15.6 (H) 11.5 - 15.5 %   Platelets 24 (LL) 150 - 400 K/uL    Comment: REPEATED TO VERIFY PLATELET COUNT CONFIRMED BY SMEAR CRITICAL RESULT CALLED TO, READ BACK BY AND VERIFIED WITH: A. THOMPSON RN [phone] GREEN R    Neutrophils Relative % 88 (H) 43 - 77 %   Lymphocytes Relative 5 (L) 12 - 46 %   Monocytes Relative 7 3 - 12 %   Eosinophils Relative 0 0 - 5 %   Basophils Relative 0 0 - 1 %   Neutro Abs 14.7 (H) 1.7 - 7.7 K/uL   Lymphs Abs 0.8 0.7 - 4.0 K/uL   Monocytes Absolute 1.2 (H) 0.1 - 1.0 K/uL   Eosinophils Absolute 0.0 0.0 - 0.7 K/uL   Basophils Absolute 0.0 0.0 - 0.1 K/uL   WBC Morphology INCREASED BANDS (>20% BANDS)     Comment: TOXIC GRANULATION  Comprehensive metabolic panel     Status: Abnormal   Collection Time: 10/21/14  4:46 AM  Result Value Ref Range   Sodium 131 (L) 135 - 145 mmol/L   Potassium 4.6 3.5 - 5.1 mmol/L   Chloride  96 96 - 112 mmol/L   CO2 20 19 - 32 mmol/L   Glucose, Bld 156 (H) 70 - 99 mg/dL   BUN 23 6 - 23 mg/dL   Creatinine, Ser 0.89 0.50 - 1.35 mg/dL   Calcium 8.6 8.4 - 10.5 mg/dL   Total Protein 7.2 6.0 - 8.3 g/dL   Albumin 2.1 (L) 3.5 - 5.2 g/dL   AST 30 0 - 37 U/L   ALT 19 0 - 53 U/L   Alkaline Phosphatase 99 39 - 117 U/L   Total Bilirubin 3.5 (H) 0.3 - 1.2 mg/dL   GFR calc non Af Amer 90 (L) >90 mL/min   GFR calc Af Amer >90 >90 mL/min    Comment: (NOTE) The eGFR has been calculated using the CKD EPI equation. This calculation has not been validated in all clinical situations. eGFR's persistently <90 mL/min signify possible Chronic Kidney Disease.    Anion gap 15 5 - 15  Sedimentation rate     Status: Abnormal   Collection Time: 10/21/14  4:46 AM  Result Value Ref Range   Sed Rate 62 (H) 0 - 16 mm/hr  Protime-INR     Status: Abnormal   Collection Time: 10/21/14  8:28 AM  Result Value Ref Range   Prothrombin Time 17.1 (H) 11.6 - 15.2 seconds   INR 1.38 0.00 - 1.49   '@BRIEFLABTABLE'$ (sdes,specrequest,cult,reptstatus)   )No results found for this or any previous visit (from the past 720 hour(s)).   Impression/Recommendation  Active Problems:   Neck pain   Fever   [name] is a 64 y.o. 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

## 2014-10-21 NOTE — Progress Notes (Signed)
  Echocardiogram 2D Echocardiogram has been performed.  [name] 10/21/2014, 2:01 PM

## 2014-10-21 NOTE — Progress Notes (Signed)
[place] with ID, suggested vanco, ancef

## 2014-10-22 DIAGNOSIS — B951 Streptococcus, group B, as the cause of diseases classified elsewhere: Secondary | ICD-10-CM

## 2014-10-22 DIAGNOSIS — E1165 Type 2 diabetes mellitus with hyperglycemia: Secondary | ICD-10-CM

## 2014-10-22 DIAGNOSIS — G062 Extradural and subdural abscess, unspecified: Secondary | ICD-10-CM

## 2014-10-22 DIAGNOSIS — G9340 Encephalopathy, unspecified: Secondary | ICD-10-CM

## 2014-10-22 DIAGNOSIS — D696 Thrombocytopenia, unspecified: Secondary | ICD-10-CM | POA: Diagnosis present

## 2014-10-22 DIAGNOSIS — K7581 Nonalcoholic steatohepatitis (NASH): Secondary | ICD-10-CM

## 2014-10-22 LAB — CBC WITH DIFFERENTIAL/PLATELET
BASOS PCT: 0 % (ref 0–1)
Basophils Absolute: 0 10*3/uL (ref 0.0–0.1)
Eosinophils Absolute: 0.1 10*3/uL (ref 0.0–0.7)
Eosinophils Relative: 1 % (ref 0–5)
HEMATOCRIT: 34.6 % — AB (ref 39.0–52.0)
HEMOGLOBIN: 11.7 g/dL — AB (ref 13.0–17.0)
Lymphocytes Relative: 11 % — ABNORMAL LOW (ref 12–46)
Lymphs Abs: 1.4 10*3/uL (ref 0.7–4.0)
MCH: 30.2 pg (ref 26.0–34.0)
MCHC: 33.8 g/dL (ref 30.0–36.0)
MCV: 89.2 fL (ref 78.0–100.0)
MONO ABS: 1.4 10*3/uL — AB (ref 0.1–1.0)
Monocytes Relative: 11 % (ref 3–12)
NEUTROS ABS: 9.5 10*3/uL — AB (ref 1.7–7.7)
Neutrophils Relative %: 77 % (ref 43–77)
Platelets: 25 10*3/uL — CL (ref 150–400)
RBC: 3.88 MIL/uL — ABNORMAL LOW (ref 4.22–5.81)
RDW: 15.7 % — AB (ref 11.5–15.5)
WBC: 12.4 10*3/uL — ABNORMAL HIGH (ref 4.0–10.5)

## 2014-10-22 LAB — GLUCOSE, CAPILLARY
GLUCOSE-CAPILLARY: 185 mg/dL — AB (ref 70–99)
Glucose-Capillary: 151 mg/dL — ABNORMAL HIGH (ref 70–99)
Glucose-Capillary: 182 mg/dL — ABNORMAL HIGH (ref 70–99)
Glucose-Capillary: 146 mg/dL — ABNORMAL HIGH (ref 70–99)

## 2014-10-22 LAB — SEDIMENTATION RATE: Sed Rate: 66 mm/hr — ABNORMAL HIGH (ref 0–16)

## 2014-10-22 LAB — COMPREHENSIVE METABOLIC PANEL
ALT: 15 U/L (ref 0–53)
AST: 26 U/L (ref 0–37)
Albumin: 1.8 g/dL — ABNORMAL LOW (ref 3.5–5.2)
Alkaline Phosphatase: 84 U/L (ref 39–117)
Anion gap: 12 (ref 5–15)
BILIRUBIN TOTAL: 2.4 mg/dL — AB (ref 0.3–1.2)
BUN: 21 mg/dL (ref 6–23)
CO2: 21 mmol/L (ref 19–32)
CREATININE: 0.85 mg/dL (ref 0.50–1.35)
Calcium: 8.2 mg/dL — ABNORMAL LOW (ref 8.4–10.5)
Chloride: 100 mmol/L (ref 96–112)
GFR calc Af Amer: >90 mL/min (ref >90)
GFR calc non Af Amer: >90 mL/min (ref >90)
Glucose, Bld: 145 mg/dL — ABNORMAL HIGH (ref 70–99)
Potassium: 3.8 mmol/L (ref 3.5–5.1)
Sodium: 133 mmol/L — ABNORMAL LOW (ref 135–145)
Total Protein: 6.5 g/dL (ref 6.0–8.3)

## 2014-10-22 LAB — C-REACTIVE PROTEIN: CRP: 19.1 mg/dL — ABNORMAL HIGH (ref <0.60)

## 2014-10-22 MED ORDER — HYDROCODONE-ACETAMINOPHEN 5-325 MG PO TABS
1.0000 | ORAL_TABLET | Freq: Four times a day (QID) | ORAL | Status: DC | PRN
  Administered 2014-10-22 – 2014-10-24 (×3): 1 via ORAL
  Filled 2014-10-22 (×3): qty 1

## 2014-10-22 MED ORDER — MORPHINE SULFATE 2 MG/ML IJ SOLN
1.0000 mg | Freq: Once | INTRAMUSCULAR | Status: AC
Start: 2014-10-22 — End: 2014-10-22
  Administered 2014-10-22: 1 mg via INTRAVENOUS
  Filled 2014-10-22: qty 1

## 2014-10-22 NOTE — Progress Notes (Signed)
Subjective: Patient reports Significant improvement this morning increase strength better ambulation still with neck and shoulder pain but also related to history of a traumatic strain to his shoulder 2 weeks ago while cutting the grass.  Objective: Vital signs in last 24 hours: Temp:  [97.5 F (36.4 C)-99.1 F (37.3 C)] 97.5 F (36.4 C) (04/17 0550) Pulse Rate:  [70-81] 74 (04/17 0550) Resp:  [18-20] 18 (04/17 0550) BP: (122-145)/(48-73) 145/73 mmHg (04/17 0550) SpO2:  [96 %-98 %] 96 % (04/17 0550) Weight:  [89.1 kg (196 lb 6.9 oz)] 89.1 kg (196 lb 6.9 oz) (04/17 0550)  Intake/Output from previous day: 04/16 0701 - 04/17 0700 In: 1200 [I.V.:1000; IV Piggyback:200] Out: 650 [Urine:650] Intake/Output this shift: Total I/O In: 240 [P.O.:240] Out: 300 [Urine:300]  Neurologic improved still has some weakness in his left tricep but I grade as a 4+ out of 5 today which is better than yesterday. Left lower extremity now is about 5 out of 5. Significant improved left-sided neglect and much less encephalopathic  Lab Results:  Recent Labs  10/21/14 0446 10/22/14 0625  WBC 16.7* 12.4*  HGB 13.2 11.7*  HCT 39.0 34.6*  PLT 24* 25*   BMET  Recent Labs  10/21/14 0446 10/22/14 0625  NA 131* 133*  K 4.6 3.8  CL 96 100  CO2 20 21  GLUCOSE 156* 145*  BUN 23 21  CREATININE 0.89 0.85  CALCIUM 8.6 8.2*    Studies/Results: Dg Chest 2 View  10/21/2014   CLINICAL DATA:  Fever  EXAM: CHEST  2 VIEW  COMPARISON:  10/20/2014  FINDINGS: Very low lung volumes with streaky opacities over the diaphragm in the lateral projection. No edema or effusion. No overt pneumonia. Apparent cardiomegaly, although size is accentuated by technique.  IMPRESSION: Marked hypoinflation with bibasilar atelectasis.   Electronically Signed   By: Monte Fantasia M.D.   On: 10/21/2014 05:04   Ct Head W Wo Contrast  10/21/2014   CLINICAL DATA:  64 year old male with fever and acute encephalopathy. Initial  encounter. Current history of cirrhosis. Bacteremia, acute cervical spine infection including epidural and paraspinal soft tissue involvement.  EXAM: CT HEAD WITHOUT AND WITH CONTRAST  TECHNIQUE: Contiguous axial images were obtained from the base of the skull through the vertex without and with intravenous contrast  CONTRAST:  83mL OMNIPAQUE IOHEXOL 300 MG/ML  SOLN  COMPARISON:  Cervical spine MRI 0052 hours today. [facility] Head CT without contrast 09/19/2012.  FINDINGS: Chronic left orbital floor fracture. No acute osseous abnormality identified. Visualized paranasal sinuses and mastoids are clear. No acute scalp soft tissue findings.  There is a metallic surgical clip or foreign body at the medial right orbit re - identified and unchanged since 2014 (series 3, image 18). Otherwise negative visualized orbits soft tissues.  Cerebral volume remains normal. No ventriculomegaly. No midline shift, mass effect, or evidence of intracranial mass lesion. No acute intracranial hemorrhage identified. No evidence of cortically based acute infarction identified. Stable and normal gray-white matter differentiation. No abnormal enhancement identified. Major intracranial vascular structures are enhancing.  IMPRESSION: 1.  Normal CT appearance of the brain. 2. Chronic surgical clip or metallic foreign body at the medial right orbit near the insertion of the medial rectus muscle, unchanged since 2014 head CT.   Electronically Signed   By: Genevie Ann M.D.   On: 10/21/2014 10:04   Mr Cervical Spine W Wo Contrast  10/21/2014   CLINICAL DATA:  Initial evaluation for 2 week history of neck  pain 8. Fever. Recent bacteremia. Concern for possible infection. History of diabetes, cirrhosis.  EXAM: MRI CERVICAL SPINE WITHOUT AND WITH CONTRAST  TECHNIQUE: Multiplanar and multiecho pulse sequences of the cervical spine, to include the craniocervical junction and cervicothoracic junction, were obtained according to standard protocol  without and with intravenous contrast.  CONTRAST:  19mL MULTIHANCE GADOBENATE DIMEGLUMINE 529 MG/ML IV SOLN  COMPARISON:  Prior radiograph from 10/19/2014  FINDINGS: Visualized portions of the brain itself are normal. There is abnormal dural enhancement extending cephalad at the level of the foramen magnum. Craniocervical junction within normal limits.  Trace anterior listhesis of C6 on C7. Otherwise, vertebral bodies are normally aligned with preservation of the normal cervical lordosis. Vertebral body heights are preserved. Signal intensity within the vertebral body bone marrow is normal. No evidence for osteomyelitis discitis. No prevertebral edema.  Signal intensity within the cervical spinal cord is within normal limits, although evaluation is somewhat limited due to motion artifact on this exam.  There is a in T2 hyperintense rim enhancing collection predominant located within the left posterolateral epidural space of the cervical spinal cord (series 17, image 13). This collection essentially begins at day foramen magnum/level of C1, and extends inferiorly to approximately the level of C6. Collection is most evident within the left posterolateral epidural space at the level of C5 where it measures 6 x 9 mm (series 17, image 16). There is abnormal enhancement with phlegmonous change throughout the left lateral and posterior epidural space extending from the level of C1 inferiorly to approximately C6. Abscess wraps around into the ventral epidural space at the level of C2 (series 17, image 3). Additional abscess appears to extend through the left neural foramen at C5-6 (Series 17, image 18). There is associated moderate to severe diffuse canal stenosis with the cervical spinal cord somewhat flattened and displaced to the right. Stenosis is most severe at the C4-5 level. Again, there are no definite cord signal changes.  There is abnormal enhancement throughout the posterior paraspinous musculature, extending  from the base of the occiput inferiorly to approximately the level of C7. There are multi focal irregular abscesses within the paraspinous musculature (series 15, image 14). These are predominantly within the left paraspinous musculature. For reference purposes, the most prominent collection measures approximately 8 x 16 x 18 mm at the level of C6-7 (series 15, image 13). Additional phlegmon/abscess located more superiorly within the left paraspinous musculature just beneath the occiput.  C2-3: Bilateral uncovertebral spurring with mild bilateral facet hypertrophy. Mild stenosis related to the epidural abscess.  C3-4: Bilateral uncovertebral hypertrophy with facet arthrosis. Resultant mild left foraminal narrowing. Mild canal stenosis related to the epidural abscess.  C4-5: Mild diffuse degenerative disc osteophyte with bilateral uncovertebral spurring and facet arthrosis. Superimposed right paracentral/foraminal disc osteophyte complex. Moderate canal stenosis related to degenerative changes and epidural abscess. Moderate right with more mild left foraminal narrowing.  C5-6: Mild uncovertebral hypertrophy with facet arthrosis. Moderate canal stenosis related epidural abscess. Again, abscess appears to extend through the left C5-6 neural foramen.  C6-7: Trace anterolisthesis with associated posterior disc bulge. Mild canal stenosis. Mild bilateral facet arthrosis present. No significant foraminal narrowing.  C7-T1: Central disc protrusion with mild bilateral uncovertebral hypertrophy. Resultant mild canal stenosis. Foramina are grossly patent.  IMPRESSION: 1. Epidural abscess with enhancing phlegmonous change extending from the level of C1 inferiorly to approximately the level of C6-7 as detailed above. Abscess is predominantly located within the left lateral and left posterolateral epidural space, but does  extend anteriorly into the ventral epidural space at the level of C2. There is extension through the left C5-6  neural foramen as well. There is secondary moderate to severe diffuse canal stenosis with flattening and mild rightward displacement of the cervical spinal cord, most prevalent at the C4-5 level. No cord signal changes. 2. Extensive abnormal enhancement with multi focal abscesses involving the posterior paraspinous musculature, predominantly on the left. 3. Abnormal dural enhancement extending superiorly through the foramen magnum. 4. No evidence for osteomyelitis discitis. 5. Multilevel degenerative disc disease as above. Results were called by telephone at the time of interpretation on 10/21/2014 at 2:08 am to Dr. Jani Gravel , who verbally acknowledged these results.   Electronically Signed   By: Jeannine Boga M.D.   On: 10/21/2014 02:14    Assessment/Plan: 64 year old gentleman with paraspinal cervical and epidural abscess with significant provement on IV antibiotics. Continue IV antibiotics per ID. Continue physical and occupational therapy.  LOS: 2 days     [name] P 10/22/2014, 9:44 AM

## 2014-10-22 NOTE — Progress Notes (Signed)
RN called to patient room multiple times for pain medication. When RN went to room patient was asleep. RN explained to family that per MD note caution was to be used with sedating medications. Explained to family that if patient wakes up and is in pain to alert RN. No pain medication given at this time.

## 2014-10-22 NOTE — Progress Notes (Signed)
[facility] PROGRESS NOTE  [name] OXB:[id number] DOB: [birth year] DOA: 10/20/2014 PCP: [name], MD  Summary 64 year old male with history of diabetes and nonalcoholic fatty liver disease/cirrhosis instructed to come to the emergency room after outpatient blood cultures came back positive for gram-positive cocci in pairs and chains from aerobic and anaerobic bottles. Was found to be encephalopathic and have a paraspinal and epidural cervical abscess. Neurosurgery and infectious disease following. Assessment/Plan:  Active Problems:   cervical Epidural/paraspinous abscess:  Per IR, nothing safe to aspirate.  4/16 I called [facility] [phone] regarding specimen [id number]. Still just preliminary results recovered from aerobic and anaerobic bottles, gram-positive cocci in pairs and chains.  On vancomycin and ceftriaxone per ID recommendations. Will stop levofloxacin.     Encephalopathy acute:  More alert today. Will advance diet. Get PT OT evaluation    Bacteremia: Growing gram-positive cocci in pairs and chains    NASH (nonalcoholic steatohepatitis)    Other cirrhosis of liver    Diabetes mellitus type 2, uncontrolled:  Sliding scale only for now    Thrombocytopenia   Code Status:  full Family Communication:  Wife at bedside Disposition Plan:  ?  Consultants:  Neurosurgery  Infectious disease  Procedures:     Antibiotics:  Vancomycin, Rocephin 4/16--  Levofloxacin 4/16-4/17  HPI/Subjective: Neck is very painful. No vomiting diarrhea. Tolerating clear liquids. Still very weak. Can't get out of bed.  Objective: Filed Vitals:   10/22/14 0550  BP: 145/73  Pulse: 74  Temp: 97.5 F (36.4 C)  Resp: 18    Intake/Output Summary (Last 24 hours) at 10/22/14 1323 Last data filed at 10/22/14 0831  Gross per 24 hour  Intake   1440 ml  Output    600 ml  Net    840 ml   Filed Weights   10/20/14 2219 10/21/14 0500 10/22/14 0550  Weight: 90.3  kg (199 lb 1.2 oz) 88.6 kg (195 lb 5.2 oz) 89.1 kg (196 lb 6.9 oz)    Exam:   General:  In bed. Appears uncomfortable. Alert. Oriented and appropriate. Does not remember me from yesterday.  Cardiovascular: Regular rate rhythm without murmurs gallops rubs  Respiratory: Clear to auscultation bilaterally without wheezes rhonchi or rales  Abdomen: Soft nontender nondistended  Ext: Clubbing cyanosis or edema  Neurologic: Strength appears symmetric  Basic Metabolic Panel:  Recent Labs Lab 10/21/14 0446 10/22/14 0625  NA 131* 133*  K 4.6 3.8  CL 96 100  CO2 20 21  GLUCOSE 156* 145*  BUN 23 21  CREATININE 0.89 0.85  CALCIUM 8.6 8.2*   Liver Function Tests:  Recent Labs Lab 10/21/14 0446 10/22/14 0625  AST 30 26  ALT 19 15  ALKPHOS 99 84  BILITOT 3.5* 2.4*  PROT 7.2 6.5  ALBUMIN 2.1* 1.8*   No results for input(s): LIPASE, AMYLASE in the last 168 hours. No results for input(s): AMMONIA in the last 168 hours. CBC:  Recent Labs Lab 10/21/14 0446 10/22/14 0625  WBC 16.7* 12.4*  NEUTROABS 14.7* 9.5*  HGB 13.2 11.7*  HCT 39.0 34.6*  MCV 89.2 89.2  PLT 24* 25*   Cardiac Enzymes: No results for input(s): CKTOTAL, CKMB, CKMBINDEX, TROPONINI in the last 168 hours. BNP (last 3 results) No results for input(s): BNP in the last 8760 hours.  ProBNP (last 3 results) No results for input(s): PROBNP in the last 8760 hours.  CBG:  Recent Labs Lab 10/21/14 1742 10/21/14 2136 10/21/14 2348 10/22/14 0811 10/22/14 1230  GLUCAP 138*  217* 185* 151* 185*    Recent Results (from the past 240 hour(s))  Culture, blood (routine x 2)     Status: None (Preliminary result)   Collection Time: 10/21/14  4:46 AM  Result Value Ref Range Status   Specimen Description BLOOD RIGHT ASSIST CONTROL  Final   Special Requests BOTTLES DRAWN AEROBIC AND ANAEROBIC 10 CC  Final   Culture   Final           BLOOD CULTURE RECEIVED NO GROWTH TO DATE CULTURE WILL BE HELD FOR 5 DAYS BEFORE  ISSUING A FINAL NEGATIVE REPORT Performed at [facility]    Report Status PENDING  Incomplete  Culture, blood (routine x 2)     Status: None (Preliminary result)   Collection Time: 10/21/14  4:53 AM  Result Value Ref Range Status   Specimen Description BLOOD RIGHT HAND  Final   Special Requests BOTTLES DRAWN AEROBIC ONLY 7 CC  Final   Culture   Final           BLOOD CULTURE RECEIVED NO GROWTH TO DATE CULTURE WILL BE HELD FOR 5 DAYS BEFORE ISSUING A FINAL NEGATIVE REPORT Performed at [facility]    Report Status PENDING  Incomplete     Studies: Dg Chest 2 View  10/21/2014   CLINICAL DATA:  Fever  EXAM: CHEST  2 VIEW  COMPARISON:  10/20/2014  FINDINGS: Very low lung volumes with streaky opacities over the diaphragm in the lateral projection. No edema or effusion. No overt pneumonia. Apparent cardiomegaly, although size is accentuated by technique.  IMPRESSION: Marked hypoinflation with bibasilar atelectasis.   Electronically Signed   By: Monte Fantasia M.D.   On: 10/21/2014 05:04   Ct Head W Wo Contrast  10/21/2014   CLINICAL DATA:  64 year old male with fever and acute encephalopathy. Initial encounter. Current history of cirrhosis. Bacteremia, acute cervical spine infection including epidural and paraspinal soft tissue involvement.  EXAM: CT HEAD WITHOUT AND WITH CONTRAST  TECHNIQUE: Contiguous axial images were obtained from the base of the skull through the vertex without and with intravenous contrast  CONTRAST:  75mL OMNIPAQUE IOHEXOL 300 MG/ML  SOLN  COMPARISON:  Cervical spine MRI 0052 hours today. [facility] Head CT without contrast 09/19/2012.  FINDINGS: Chronic left orbital floor fracture. No acute osseous abnormality identified. Visualized paranasal sinuses and mastoids are clear. No acute scalp soft tissue findings.  There is a metallic surgical clip or foreign body at the medial right orbit re - identified and unchanged since 2014 (series 3, image 18).  Otherwise negative visualized orbits soft tissues.  Cerebral volume remains normal. No ventriculomegaly. No midline shift, mass effect, or evidence of intracranial mass lesion. No acute intracranial hemorrhage identified. No evidence of cortically based acute infarction identified. Stable and normal gray-white matter differentiation. No abnormal enhancement identified. Major intracranial vascular structures are enhancing.  IMPRESSION: 1.  Normal CT appearance of the brain. 2. Chronic surgical clip or metallic foreign body at the medial right orbit near the insertion of the medial rectus muscle, unchanged since 2014 head CT.   Electronically Signed   By: Genevie Ann M.D.   On: 10/21/2014 10:04   Mr Cervical Spine W Wo Contrast  10/21/2014   CLINICAL DATA:  Initial evaluation for 2 week history of neck pain 8. Fever. Recent bacteremia. Concern for possible infection. History of diabetes, cirrhosis.  EXAM: MRI CERVICAL SPINE WITHOUT AND WITH CONTRAST  TECHNIQUE: Multiplanar and multiecho pulse sequences of the cervical  spine, to include the craniocervical junction and cervicothoracic junction, were obtained according to standard protocol without and with intravenous contrast.  CONTRAST:  16mL MULTIHANCE GADOBENATE DIMEGLUMINE 529 MG/ML IV SOLN  COMPARISON:  Prior radiograph from 10/19/2014  FINDINGS: Visualized portions of the brain itself are normal. There is abnormal dural enhancement extending cephalad at the level of the foramen magnum. Craniocervical junction within normal limits.  Trace anterior listhesis of C6 on C7. Otherwise, vertebral bodies are normally aligned with preservation of the normal cervical lordosis. Vertebral body heights are preserved. Signal intensity within the vertebral body bone marrow is normal. No evidence for osteomyelitis discitis. No prevertebral edema.  Signal intensity within the cervical spinal cord is within normal limits, although evaluation is somewhat limited due to motion  artifact on this exam.  There is a in T2 hyperintense rim enhancing collection predominant located within the left posterolateral epidural space of the cervical spinal cord (series 17, image 13). This collection essentially begins at day foramen magnum/level of C1, and extends inferiorly to approximately the level of C6. Collection is most evident within the left posterolateral epidural space at the level of C5 where it measures 6 x 9 mm (series 17, image 16). There is abnormal enhancement with phlegmonous change throughout the left lateral and posterior epidural space extending from the level of C1 inferiorly to approximately C6. Abscess wraps around into the ventral epidural space at the level of C2 (series 17, image 3). Additional abscess appears to extend through the left neural foramen at C5-6 (Series 17, image 18). There is associated moderate to severe diffuse canal stenosis with the cervical spinal cord somewhat flattened and displaced to the right. Stenosis is most severe at the C4-5 level. Again, there are no definite cord signal changes.  There is abnormal enhancement throughout the posterior paraspinous musculature, extending from the base of the occiput inferiorly to approximately the level of C7. There are multi focal irregular abscesses within the paraspinous musculature (series 15, image 14). These are predominantly within the left paraspinous musculature. For reference purposes, the most prominent collection measures approximately 8 x 16 x 18 mm at the level of C6-7 (series 15, image 13). Additional phlegmon/abscess located more superiorly within the left paraspinous musculature just beneath the occiput.  C2-3: Bilateral uncovertebral spurring with mild bilateral facet hypertrophy. Mild stenosis related to the epidural abscess.  C3-4: Bilateral uncovertebral hypertrophy with facet arthrosis. Resultant mild left foraminal narrowing. Mild canal stenosis related to the epidural abscess.  C4-5: Mild  diffuse degenerative disc osteophyte with bilateral uncovertebral spurring and facet arthrosis. Superimposed right paracentral/foraminal disc osteophyte complex. Moderate canal stenosis related to degenerative changes and epidural abscess. Moderate right with more mild left foraminal narrowing.  C5-6: Mild uncovertebral hypertrophy with facet arthrosis. Moderate canal stenosis related epidural abscess. Again, abscess appears to extend through the left C5-6 neural foramen.  C6-7: Trace anterolisthesis with associated posterior disc bulge. Mild canal stenosis. Mild bilateral facet arthrosis present. No significant foraminal narrowing.  C7-T1: Central disc protrusion with mild bilateral uncovertebral hypertrophy. Resultant mild canal stenosis. Foramina are grossly patent.  IMPRESSION: 1. Epidural abscess with enhancing phlegmonous change extending from the level of C1 inferiorly to approximately the level of C6-7 as detailed above. Abscess is predominantly located within the left lateral and left posterolateral epidural space, but does extend anteriorly into the ventral epidural space at the level of C2. There is extension through the left C5-6 neural foramen as well. There is secondary moderate to severe diffuse canal stenosis  with flattening and mild rightward displacement of the cervical spinal cord, most prevalent at the C4-5 level. No cord signal changes. 2. Extensive abnormal enhancement with multi focal abscesses involving the posterior paraspinous musculature, predominantly on the left. 3. Abnormal dural enhancement extending superiorly through the foramen magnum. 4. No evidence for osteomyelitis discitis. 5. Multilevel degenerative disc disease as above. Results were called by telephone at the time of interpretation on 10/21/2014 at 2:08 am to Dr. Jani Gravel , who verbally acknowledged these results.   Electronically Signed   By: Jeannine Boga M.D.   On: 10/21/2014 02:14    Scheduled Meds: .  cefTRIAXone (ROCEPHIN)  IV  2 g Intravenous Q12H  . feeding supplement (RESOURCE BREEZE)  1 Container Oral TID BM  . insulin aspart  0-5 Units Subcutaneous QHS  . insulin aspart  0-9 Units Subcutaneous TID WC  . levofloxacin (LEVAQUIN) IV  750 mg Intravenous QHS  . pantoprazole  40 mg Oral q morning - 10a  . propranolol  20 mg Oral q morning - 10a  . sodium chloride  3 mL Intravenous Q12H  . sodium chloride  3 mL Intravenous Q12H  . [START ON 10/23/2014] Vitamin D (Ergocalciferol)  50,000 Units Oral Q Mon   Continuous Infusions: . sodium chloride 100 mL/hr at 10/22/14 0724    Time spent: 25 minutes  [facility] [email or web address], password [facility] 10/22/2014, 1:23 PM  LOS: 2 days

## 2014-10-22 NOTE — Progress Notes (Signed)
Morphine wasted in pyxis with C.Gardner, RN. Accidentally documented waste on 2 different administrations (2337 and 0124) on one pulled 2mg  vial. Called pharmacy, instructed to document waste on other pulled vial, and make this note.

## 2014-10-22 NOTE — Progress Notes (Signed)
[place] [facility]    Subjective: Complaining of neck pain   Antibiotics:  Anti-infectives    Start     Dose/Rate Route Frequency Ordered Stop   10/21/14 2200  levofloxacin (LEVAQUIN) IVPB 750 mg  Status:  Discontinued     750 mg 100 mL/hr over 90 Minutes Intravenous Daily at bedtime 10/21/14 1105 10/22/14 1343   10/21/14 2200  cefTRIAXone (ROCEPHIN) 2 g in dextrose 5 % 50 mL IVPB - Premix     2 g 100 mL/hr over 30 Minutes Intravenous Every 12 hours 10/21/14 1405     10/21/14 1400  cefTRIAXone (ROCEPHIN) 2 g in dextrose 5 % 50 mL IVPB - Premix  Status:  Discontinued     2 g 100 mL/hr over 30 Minutes Intravenous Every 24 hours 10/21/14 1358 10/21/14 1405   10/21/14 0600  vancomycin (VANCOCIN) 1,250 mg in sodium chloride 0.9 % 250 mL IVPB  Status:  Discontinued     1,250 mg 166.7 mL/hr over 90 Minutes Intravenous Every 12 hours 10/21/14 0137 10/21/14 1358   10/21/14 0130  levofloxacin (LEVAQUIN) IVPB 500 mg  Status:  Discontinued     500 mg 100 mL/hr over 60 Minutes Intravenous Daily at bedtime 10/21/14 0030 10/21/14 1105      Medications: Scheduled Meds: . cefTRIAXone (ROCEPHIN)  IV  2 g Intravenous Q12H  . feeding supplement (RESOURCE BREEZE)  1 Container Oral TID BM  . insulin aspart  0-5 Units Subcutaneous QHS  . insulin aspart  0-9 Units Subcutaneous TID WC  . pantoprazole  40 mg Oral q morning - 10a  . propranolol  20 mg Oral q morning - 10a  . sodium chloride  3 mL Intravenous Q12H  . sodium chloride  3 mL Intravenous Q12H  . [START ON 10/23/2014] Vitamin D (Ergocalciferol)  50,000 Units Oral Q Mon   Continuous Infusions: . sodium chloride 50 mL/hr at 10/22/14 1328   PRN Meds:.sodium chloride, acetaminophen, HYDROcodone-acetaminophen, morphine injection, sodium chloride    Objective: Weight change: -2 lb 10.3 oz (-1.2 kg)  Intake/Output Summary (Last 24 hours) at 10/22/14 1429 Last data filed at 10/22/14 1350  Gross per 24 hour  Intake    1920 ml  Output    600 ml  Net   1320 ml   Blood pressure 128/72, pulse 66, temperature 97.8 F (36.6 C), temperature source Axillary, resp. rate 20, height 5\' 11"  (1.803 m), weight 196 lb 6.9 oz (89.1 kg), SpO2 97 %. Temp:  [97.5 F (36.4 C)-99.1 F (37.3 C)] 97.8 F (36.6 C) (04/17 1350) Pulse Rate:  [66-81] 66 (04/17 1350) Resp:  [18-20] 20 (04/17 1350) BP: (122-145)/(48-73) 128/72 mmHg (04/17 1350) SpO2:  [96 %-98 %] 97 % (04/17 1350) Weight:  [196 lb 6.9 oz (89.1 kg)] 196 lb 6.9 oz (89.1 kg) (04/17 0550)  Physical Exam: General: alert and awake and oriented to person location got the month wrong thinking it was May HEENT: anicteric sclera, pupils reactive to light and accommodation, EOMI, oropharynx clear and without exudate CVS regular rate, normal r, no murmur rubs or gallops Chest: clear to auscultation bilaterally, no wheezing, rales or rhonchi Abdomen: soft nontender, nondistended, normal bowel sounds, Extremities: no clubbing or edema noted bilaterally Skin: Has chronic hyperpigmented lesions on his left leg  Neuro: ? LUE weakness, otherwise nonfocal  CBC: CBC Latest Ref Rng 10/22/2014 10/21/2014 04/21/2014  WBC 4.0 - 10.5 K/uL 12.4(H) 16.7(H) 4.5  Hemoglobin 13.0 - 17.0 g/dL 11.7(L) 13.2 13.7  Hematocrit 39.0 - 52.0 % 34.6(L) 39.0 40.5  Platelets 150 - 400 K/uL 25(LL) 24(LL) 24.0 Repeated and verified X2.(LL)       BMET  Recent Labs  10/21/14 0446 10/22/14 0625  NA 131* 133*  K 4.6 3.8  CL 96 100  CO2 20 21  GLUCOSE 156* 145*  BUN 23 21  CREATININE 0.89 0.85  CALCIUM 8.6 8.2*     Liver Panel   Recent Labs  10/21/14 0446 10/22/14 0625  PROT 7.2 6.5  ALBUMIN 2.1* 1.8*  AST 30 26  ALT 19 15  ALKPHOS 99 84  BILITOT 3.5* 2.4*       Sedimentation Rate  Recent Labs  10/22/14 0625  ESRSEDRATE 66*   C-Reactive Protein  Recent Labs  10/21/14 0446  CRP 27.4*    Micro Results: Recent Results (from the past 720 hour(s))    Culture, blood (routine x 2)     Status: None (Preliminary result)   Collection Time: 10/21/14  4:46 AM  Result Value Ref Range Status   Specimen Description BLOOD RIGHT ASSIST CONTROL  Final   Special Requests BOTTLES DRAWN AEROBIC AND ANAEROBIC 10 CC  Final   Culture   Final           BLOOD CULTURE RECEIVED NO GROWTH TO DATE CULTURE WILL BE HELD FOR 5 DAYS BEFORE ISSUING A FINAL NEGATIVE REPORT Performed at [facility]    Report Status PENDING  Incomplete  Culture, blood (routine x 2)     Status: None (Preliminary result)   Collection Time: 10/21/14  4:53 AM  Result Value Ref Range Status   Specimen Description BLOOD RIGHT HAND  Final   Special Requests BOTTLES DRAWN AEROBIC ONLY 7 CC  Final   Culture   Final           BLOOD CULTURE RECEIVED NO GROWTH TO DATE CULTURE WILL BE HELD FOR 5 DAYS BEFORE ISSUING A FINAL NEGATIVE REPORT Performed at [facility]    Report Status PENDING  Incomplete    Studies/Results: Dg Chest 2 View  10/21/2014   CLINICAL DATA:  Fever  EXAM: CHEST  2 VIEW  COMPARISON:  10/20/2014  FINDINGS: Very low lung volumes with streaky opacities over the diaphragm in the lateral projection. No edema or effusion. No overt pneumonia. Apparent cardiomegaly, although size is accentuated by technique.  IMPRESSION: Marked hypoinflation with bibasilar atelectasis.   Electronically Signed   By: Monte Fantasia M.D.   On: 10/21/2014 05:04   Ct Head W Wo Contrast  10/21/2014   CLINICAL DATA:  64 year old male with fever and acute encephalopathy. Initial encounter. Current history of cirrhosis. Bacteremia, acute cervical spine infection including epidural and paraspinal soft tissue involvement.  EXAM: CT HEAD WITHOUT AND WITH CONTRAST  TECHNIQUE: Contiguous axial images were obtained from the base of the skull through the vertex without and with intravenous contrast  CONTRAST:  67mL OMNIPAQUE IOHEXOL 300 MG/ML  SOLN  COMPARISON:  Cervical spine MRI 0052 hours  today. [facility] Head CT without contrast 09/19/2012.  FINDINGS: Chronic left orbital floor fracture. No acute osseous abnormality identified. Visualized paranasal sinuses and mastoids are clear. No acute scalp soft tissue findings.  There is a metallic surgical clip or foreign body at the medial right orbit re - identified and unchanged since 2014 (series 3, image 18). Otherwise negative visualized orbits soft tissues.  Cerebral volume remains normal. No ventriculomegaly. No midline shift, mass effect, or evidence of intracranial mass lesion. No  acute intracranial hemorrhage identified. No evidence of cortically based acute infarction identified. Stable and normal gray-white matter differentiation. No abnormal enhancement identified. Major intracranial vascular structures are enhancing.  IMPRESSION: 1.  Normal CT appearance of the brain. 2. Chronic surgical clip or metallic foreign body at the medial right orbit near the insertion of the medial rectus muscle, unchanged since 2014 head CT.   Electronically Signed   By: Genevie Ann M.D.   On: 10/21/2014 10:04   Mr Cervical Spine W Wo Contrast  10/21/2014   CLINICAL DATA:  Initial evaluation for 2 week history of neck pain 8. Fever. Recent bacteremia. Concern for possible infection. History of diabetes, cirrhosis.  EXAM: MRI CERVICAL SPINE WITHOUT AND WITH CONTRAST  TECHNIQUE: Multiplanar and multiecho pulse sequences of the cervical spine, to include the craniocervical junction and cervicothoracic junction, were obtained according to standard protocol without and with intravenous contrast.  CONTRAST:  65mL MULTIHANCE GADOBENATE DIMEGLUMINE 529 MG/ML IV SOLN  COMPARISON:  Prior radiograph from 10/19/2014  FINDINGS: Visualized portions of the brain itself are normal. There is abnormal dural enhancement extending cephalad at the level of the foramen magnum. Craniocervical junction within normal limits.  Trace anterior listhesis of C6 on C7. Otherwise, vertebral  bodies are normally aligned with preservation of the normal cervical lordosis. Vertebral body heights are preserved. Signal intensity within the vertebral body bone marrow is normal. No evidence for osteomyelitis discitis. No prevertebral edema.  Signal intensity within the cervical spinal cord is within normal limits, although evaluation is somewhat limited due to motion artifact on this exam.  There is a in T2 hyperintense rim enhancing collection predominant located within the left posterolateral epidural space of the cervical spinal cord (series 17, image 13). This collection essentially begins at day foramen magnum/level of C1, and extends inferiorly to approximately the level of C6. Collection is most evident within the left posterolateral epidural space at the level of C5 where it measures 6 x 9 mm (series 17, image 16). There is abnormal enhancement with phlegmonous change throughout the left lateral and posterior epidural space extending from the level of C1 inferiorly to approximately C6. Abscess wraps around into the ventral epidural space at the level of C2 (series 17, image 3). Additional abscess appears to extend through the left neural foramen at C5-6 (Series 17, image 18). There is associated moderate to severe diffuse canal stenosis with the cervical spinal cord somewhat flattened and displaced to the right. Stenosis is most severe at the C4-5 level. Again, there are no definite cord signal changes.  There is abnormal enhancement throughout the posterior paraspinous musculature, extending from the base of the occiput inferiorly to approximately the level of C7. There are multi focal irregular abscesses within the paraspinous musculature (series 15, image 14). These are predominantly within the left paraspinous musculature. For reference purposes, the most prominent collection measures approximately 8 x 16 x 18 mm at the level of C6-7 (series 15, image 13). Additional phlegmon/abscess located more  superiorly within the left paraspinous musculature just beneath the occiput.  C2-3: Bilateral uncovertebral spurring with mild bilateral facet hypertrophy. Mild stenosis related to the epidural abscess.  C3-4: Bilateral uncovertebral hypertrophy with facet arthrosis. Resultant mild left foraminal narrowing. Mild canal stenosis related to the epidural abscess.  C4-5: Mild diffuse degenerative disc osteophyte with bilateral uncovertebral spurring and facet arthrosis. Superimposed right paracentral/foraminal disc osteophyte complex. Moderate canal stenosis related to degenerative changes and epidural abscess. Moderate right with more mild left foraminal narrowing.  C5-6: Mild uncovertebral hypertrophy with facet arthrosis. Moderate canal stenosis related epidural abscess. Again, abscess appears to extend through the left C5-6 neural foramen.  C6-7: Trace anterolisthesis with associated posterior disc bulge. Mild canal stenosis. Mild bilateral facet arthrosis present. No significant foraminal narrowing.  C7-T1: Central disc protrusion with mild bilateral uncovertebral hypertrophy. Resultant mild canal stenosis. Foramina are grossly patent.  IMPRESSION: 1. Epidural abscess with enhancing phlegmonous change extending from the level of C1 inferiorly to approximately the level of C6-7 as detailed above. Abscess is predominantly located within the left lateral and left posterolateral epidural space, but does extend anteriorly into the ventral epidural space at the level of C2. There is extension through the left C5-6 neural foramen as well. There is secondary moderate to severe diffuse canal stenosis with flattening and mild rightward displacement of the cervical spinal cord, most prevalent at the C4-5 level. No cord signal changes. 2. Extensive abnormal enhancement with multi focal abscesses involving the posterior paraspinous musculature, predominantly on the left. 3. Abnormal dural enhancement extending superiorly through  the foramen magnum. 4. No evidence for osteomyelitis discitis. 5. Multilevel degenerative disc disease as above. Results were called by telephone at the time of interpretation on 10/21/2014 at 2:08 am to Dr. Jani Gravel , who verbally acknowledged these results.   Electronically Signed   By: Jeannine Boga M.D.   On: 10/21/2014 02:14      Assessment/Plan:  Active Problems:   cervical Epidural abscess   Abscess in epidural space of cervical spine   Encephalopathy acute   Streptococcal bacteremia   NASH (nonalcoholic steatohepatitis)   Other cirrhosis of liver   Diabetes mellitus type 2, uncontrolled   Thrombocytopenia    [name] is a 64 y.o. male with  with GROUP B STREPTOCCAL  bacteremia and remarkable epidural abscess that extends in the foreman magnum down through C6   #1  GROUP B Streptococcal bacteremia with Epidural abscess in C spine.  He has a sclerotic aortic valve on 2-D echocardiogram  --CONTINUE HIGH DOSE ROCEPHIN 2 GRAMS IV Q 12 HOURS  WOULD DISCUSS WITH CARDIOLOGYAND NEUROSURGERY IF WOULD BE SAFE DOING TEE IN CONTEXT OF HIS EXTENSIVE CERVICAL SPINE EPIDURAL ABSCESS, I WOULD THINK MIGHT INVOLVE MANIPULATION OF NECK TO GET TEE PROBE IN THAT WOULD NOT BE DESIRED   He will need 8 weeks of parenteral therapy  Neurosurgery (Dr. 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 Linus Salmons is taking over the service tomorrow.   LOS: 2 days   Alcide Evener 10/22/2014, 2:29 PM

## 2014-10-23 LAB — URINALYSIS, ROUTINE W REFLEX MICROSCOPIC
BILIRUBIN URINE: NEGATIVE
Glucose, UA: >1000 mg/dL — AB
HGB URINE DIPSTICK: NEGATIVE
KETONES UR: 40 mg/dL — AB
LEUKOCYTES UA: NEGATIVE
Nitrite: NEGATIVE
PH: 6 (ref 5.0–8.0)
Protein, ur: NEGATIVE mg/dL
Specific Gravity, Urine: 1.035 — ABNORMAL HIGH (ref 1.005–1.030)
UROBILINOGEN UA: 1 mg/dL (ref 0.0–1.0)

## 2014-10-23 LAB — CBC
HCT: 34.3 % — ABNORMAL LOW (ref 39.0–52.0)
HEMOGLOBIN: 11.7 g/dL — AB (ref 13.0–17.0)
MCH: 30.2 pg (ref 26.0–34.0)
MCHC: 34.1 g/dL (ref 30.0–36.0)
MCV: 88.4 fL (ref 78.0–100.0)
Platelets: 26 10*3/uL — CL (ref 150–400)
RBC: 3.88 MIL/uL — ABNORMAL LOW (ref 4.22–5.81)
RDW: 15.3 % (ref 11.5–15.5)
WBC: 12.4 10*3/uL — ABNORMAL HIGH (ref 4.0–10.5)

## 2014-10-23 LAB — BASIC METABOLIC PANEL
Anion gap: 12 (ref 5–15)
BUN: 17 mg/dL (ref 6–23)
CHLORIDE: 98 mmol/L (ref 96–112)
CO2: 22 mmol/L (ref 19–32)
Calcium: 8.1 mg/dL — ABNORMAL LOW (ref 8.4–10.5)
Creatinine, Ser: 0.7 mg/dL (ref 0.50–1.35)
GFR calc Af Amer: >90 mL/min (ref >90)
GFR calc non Af Amer: >90 mL/min (ref >90)
GLUCOSE: 125 mg/dL — AB (ref 70–99)
POTASSIUM: 3.7 mmol/L (ref 3.5–5.1)
Sodium: 132 mmol/L — ABNORMAL LOW (ref 135–145)

## 2014-10-23 LAB — GLUCOSE, CAPILLARY
GLUCOSE-CAPILLARY: 185 mg/dL — AB (ref 70–99)
Glucose-Capillary: 114 mg/dL — ABNORMAL HIGH (ref 70–99)
Glucose-Capillary: 184 mg/dL — ABNORMAL HIGH (ref 70–99)
Glucose-Capillary: 178 mg/dL — ABNORMAL HIGH (ref 70–99)

## 2014-10-23 LAB — URINE MICROSCOPIC-ADD ON

## 2014-10-23 LAB — HIV ANTIBODY (ROUTINE TESTING W REFLEX): HIV Screen 4th Generation wRfx: NONREACTIVE

## 2014-10-23 LAB — HEMOGLOBIN A1C
HEMOGLOBIN A1C: 8.7 % — AB (ref 4.8–5.6)
Hgb A1c MFr Bld: 8.8 % — ABNORMAL HIGH (ref 4.8–5.6)
MEAN PLASMA GLUCOSE: 206 mg/dL
Mean Plasma Glucose: 203 mg/dL

## 2014-10-23 MED ORDER — GLUCERNA SHAKE PO LIQD
237.0000 mL | Freq: Two times a day (BID) | ORAL | Status: DC
  Administered 2014-10-23 – 2014-10-25 (×5): 237 mL via ORAL

## 2014-10-23 MED ORDER — DOCUSATE SODIUM 100 MG PO CAPS
100.0000 mg | ORAL_CAPSULE | Freq: Two times a day (BID) | ORAL | Status: DC
  Administered 2014-10-23 (×2): 100 mg via ORAL
  Filled 2014-10-23 (×4): qty 1

## 2014-10-23 NOTE — Progress Notes (Signed)
[facility] PROGRESS NOTE  [name] IRC:[id number] DOB: 1951-04-10 DOA: 10/20/2014 PCP: [name], MD  Summary 64 year old male with history of diabetes and nonalcoholic fatty liver disease/cirrhosis instructed to come to the emergency room after outpatient blood cultures came back positive for gram-positive cocci in pairs and chains from aerobic and anaerobic bottles. Was found to be encephalopathic and have a paraspinal and epidural cervical abscess. Neurosurgery and infectious disease following.  Assessment/Plan:    cervical Epidural/paraspinous abscess:  Per IR, nothing safe to aspirate.  4/16  -[facility] [phone] regarding specimen #[id number] (blood culture). Still just preliminary results recovered from aerobic and anaerobic bottles, gram-positive cocci in pairs and chains.  On vancomycin and ceftriaxone per ID recommendations.     Encephalopathy acute:  More alert today. Will advance diet.  -PT/OT evaluation    Bacteremia: Growing gram-positive cocci in pairs and chains -abx per ID -echo: Sclerotic aortic valve without stenosis- no TEE as do not want to manipulate neck    NASH (nonalcoholic steatohepatitis)    Other cirrhosis of liver    Diabetes mellitus type 2, uncontrolled:  Sliding scale only for now    Thrombocytopenia   Code Status:  full Family Communication:  Wife at bedside Disposition Plan:  ?  Consultants:  Neurosurgery  Infectious disease  Procedures:     Antibiotics:  Vancomycin, Rocephin 4/16--  Levofloxacin 4/16-4/17  HPI/Subjective: Still with severe pain Says no BM   Objective: Filed Vitals:   10/23/14 0418  BP: 132/63  Pulse: 60  Temp: 98 F (36.7 C)  Resp: 18    Intake/Output Summary (Last 24 hours) at 10/23/14 1125 Last data filed at 10/23/14 0921  Gross per 24 hour  Intake   1490 ml  Output   2400 ml  Net   -910 ml   Filed Weights   10/21/14 0500 10/22/14 0550 10/23/14 0500  Weight: 88.6 kg  (195 lb 5.2 oz) 89.1 kg (196 lb 6.9 oz) 90.9 kg (200 lb 6.4 oz)    Exam:   General:  Ill appearing  Cardiovascular: Rrr  Respiratory: Clear  Abdomen: Soft nontender nondistended  Ext: Clubbing cyanosis or edema  Neurologic: Strength appears symmetric  Basic Metabolic Panel:  Recent Labs Lab 10/21/14 0446 10/22/14 0625 10/23/14 0713  NA 131* 133* 132*  K 4.6 3.8 3.7  CL 96 100 98  CO2 20 21 22   GLUCOSE 156* 145* 125*  BUN 23 21 17   CREATININE 0.89 0.85 0.70  CALCIUM 8.6 8.2* 8.1*   Liver Function Tests:  Recent Labs Lab 10/21/14 0446 10/22/14 0625  AST 30 26  ALT 19 15  ALKPHOS 99 84  BILITOT 3.5* 2.4*  PROT 7.2 6.5  ALBUMIN 2.1* 1.8*   No results for input(s): LIPASE, AMYLASE in the last 168 hours. No results for input(s): AMMONIA in the last 168 hours. CBC:  Recent Labs Lab 10/21/14 0446 10/22/14 0625 10/23/14 0713  WBC 16.7* 12.4* 12.4*  NEUTROABS 14.7* 9.5*  --   HGB 13.2 11.7* 11.7*  HCT 39.0 34.6* 34.3*  MCV 89.2 89.2 88.4  PLT 24* 25* 26*   Cardiac Enzymes: No results for input(s): CKTOTAL, CKMB, CKMBINDEX, TROPONINI in the last 168 hours. BNP (last 3 results) No results for input(s): BNP in the last 8760 hours.  ProBNP (last 3 results) No results for input(s): PROBNP in the last 8760 hours.  CBG:  Recent Labs Lab 10/22/14 0811 10/22/14 1230 10/22/14 1739 10/22/14 2217 10/23/14 0744  GLUCAP 151* 185* 182* 146* 114*  Recent Results (from the past 240 hour(s))  Culture, blood (routine x 2)     Status: None (Preliminary result)   Collection Time: 10/21/14  4:46 AM  Result Value Ref Range Status   Specimen Description BLOOD RIGHT ASSIST CONTROL  Final   Special Requests BOTTLES DRAWN AEROBIC AND ANAEROBIC 10 CC  Final   Culture   Final           BLOOD CULTURE RECEIVED NO GROWTH TO DATE CULTURE WILL BE HELD FOR 5 DAYS BEFORE ISSUING A FINAL NEGATIVE REPORT Performed at [facility]    Report Status PENDING   Incomplete  Culture, blood (routine x 2)     Status: None (Preliminary result)   Collection Time: 10/21/14  4:53 AM  Result Value Ref Range Status   Specimen Description BLOOD RIGHT HAND  Final   Special Requests BOTTLES DRAWN AEROBIC ONLY 7 CC  Final   Culture   Final           BLOOD CULTURE RECEIVED NO GROWTH TO DATE CULTURE WILL BE HELD FOR 5 DAYS BEFORE ISSUING A FINAL NEGATIVE REPORT Performed at [facility]    Report Status PENDING  Incomplete     Studies: No results found.  Scheduled Meds: . cefTRIAXone (ROCEPHIN)  IV  2 g Intravenous Q12H  . docusate sodium  100 mg Oral BID  . feeding supplement (RESOURCE BREEZE)  1 Container Oral TID BM  . insulin aspart  0-5 Units Subcutaneous QHS  . insulin aspart  0-9 Units Subcutaneous TID WC  . pantoprazole  40 mg Oral q morning - 10a  . propranolol  20 mg Oral q morning - 10a  . sodium chloride  3 mL Intravenous Q12H  . sodium chloride  3 mL Intravenous Q12H  . Vitamin D (Ergocalciferol)  50,000 Units Oral Q Mon   Continuous Infusions: . sodium chloride 50 mL/hr at 10/22/14 1328    Time spent: 25 minutes  VANN, JESSICA  [facility] [email or web address], password [facility] 10/23/2014, 11:25 AM  LOS: 3 days

## 2014-10-23 NOTE — Evaluation (Signed)
Physical Therapy Evaluation Patient Details Name: [name] MRN: [id number] DOB: Dec 18, 1950 Today's Date: 10/23/2014   History of Present Illness  pt is a 64 y/o male admitted from [facility] with neck pain.over last 2 weeks.  MRI shows cervical epidural abscess.   PMHx:  cirrhosis, fatty liver  Clinical Impression  Pt admitted with/for cervical abscess.  Pt currently limited functionally due to the problems listed. ( See problems list.)   Pt will benefit from PT to maximize function and safety in order to get ready for next venue listed below.     Follow Up Recommendations CIR    Equipment Recommendations  None recommended by PT;Other (comment) (will defer to next venue)    Recommendations for Other Services Rehab consult     Precautions / Restrictions Precautions Precautions: Fall Restrictions Weight Bearing Restrictions: No      Mobility  Bed Mobility Overal bed mobility: Needs Assistance Bed Mobility: Rolling;Sidelying to Sit Rolling: Min assist Sidelying to sit: Min assist       General bed mobility comments: cues for direction  Transfers Overall transfer level: Needs assistance   Transfers: Sit to/from Stand Sit to Stand: Min guard         General transfer comment: guard for safety, some stabilization against the bed  Ambulation/Gait Ambulation/Gait assistance: Min assist Ambulation Distance (Feet): 110 Feet Assistive device: Rolling walker (2 wheeled);None Gait Pattern/deviations: Step-through pattern;Trunk flexed Gait velocity: slower   General Gait Details: mildly unsteady gait L LE worse than R, worsening with fatigue, some foot slap and visible incoordination L LE  Stairs            Wheelchair Mobility    Modified Rankin (Stroke Patients Only)       Balance Overall balance assessment: Needs assistance Sitting-balance support: No upper extremity supported Sitting balance-Leahy Scale: Fair     Standing balance support:  No upper extremity supported Standing balance-Leahy Scale: Fair                               Pertinent Vitals/Pain Pain Assessment: 0-10 Pain Score: 10-Worst pain ever Pain Location: left side of neck and left shoulder Pain Descriptors / Indicators: Constant;Aching;Throbbing Pain Intervention(s): Monitored during session;Repositioned;Patient requesting pain meds-RN notified    Home Living Family/patient expects to be discharged to:: Private residence Living Arrangements: Spouse/significant other Available Help at Discharge: Family Type of Home: House Home Access: Stairs to enter Entrance Stairs-Rails: None Technical brewer of Steps: 2 Home Layout: Multi-level Home Equipment: None Additional Comments: Works as a Administrator for [employer]    Prior Function Level of Independence: Independent               Hand Dominance   Dominant Hand: Right    Extremity/Trunk Assessment   Upper Extremity Assessment: RUE deficits/detail;LUE deficits/detail RUE Deficits / Details: Supine: Grossly 3/5 shoulder, rest 4/5     LUE Deficits / Details: Supine: Grossly 3/5 shoulder (with a slight drift into abduction as shoulder reaches 90 degrees), 3+/5 rest   Lower Extremity Assessment: RLE deficits/detail;LLE deficits/detail RLE Deficits / Details: functional, but proximal weakness and lower trunk LLE Deficits / Details: notably weaker at 3+/5,  quick fatiguability     Communication   Communication: No difficulties  Cognition Arousal/Alertness: Awake/alert Behavior During Therapy: WFL for tasks assessed/performed Overall Cognitive Status: Within Functional Limits for tasks assessed  General Comments      Exercises        Assessment/Plan    PT Assessment Patient needs continued PT services  PT Diagnosis Difficulty walking;Generalized weakness;Acute pain   PT Problem List Decreased strength;Decreased activity  tolerance;Decreased balance;Decreased mobility;Decreased coordination;Decreased knowledge of use of DME;Pain  PT Treatment Interventions DME instruction;Gait training;Stair training;Functional mobility training;Therapeutic activities;Balance training;Patient/family education   PT Goals (Current goals can be found in the Care Plan section) Acute Rehab PT Goals Patient Stated Goal: independent, back to work PT Goal Formulation: With patient Time For Goal Achievement: 11/06/14 Potential to Achieve Goals: Good    Frequency Min 3X/week   Barriers to discharge        Co-evaluation               End of Session   Activity Tolerance: Patient tolerated treatment well;Patient limited by fatigue;Patient limited by pain Patient left: in chair;with call bell/phone within reach Nurse Communication: Mobility status         Time: 1217-1253 PT Time Calculation (min) (ACUTE ONLY): 36 min   Charges:   PT Evaluation $Initial PT Evaluation Tier I: 1 Procedure     PT G Codes:        Erna Brossard, [name] 10/23/2014, 1:17 PM 10/23/2014  Donnella Sham, PT [phone]  (pager)

## 2014-10-23 NOTE — Care Management Note (Addendum)
    Page 1 of 1   10/25/2014     11:07:39 AM CARE MANAGEMENT NOTE 10/25/2014  Patient:  [name]   Account Number:  [id number]  Date Initiated:  10/23/2014  Documentation initiated by:  Tomi Bamberger  Subjective/Objective Assessment:   dx bacteremia, epidural abscess  admit- lives with spouse,     Action/Plan:   pt eval- rec CIR   Anticipated DC Date:  10/25/2014   Anticipated DC Plan:  IP REHAB FACILITY  In-house referral  Clinical Social Worker      DC Planning Services  CM consult      PAC Choice  IP REHAB   Choice offered to / List presented to:             Status of service:  In process, will continue to follow Medicare Important Message given?  NO (If response is "NO", the following Medicare IM given date fields will be blank) Date Medicare IM given:   Medicare IM given by:   Date Additional Medicare IM given:   Additional Medicare IM given by:    Discharge Disposition:  IP REHAB FACILITY  Per UR Regulation:  Reviewed for med. necessity/level of care/duration of stay  If discussed at [place] of Stay Meetings, dates discussed:    Comments:  10/25/14 [place], BSN [phone] patient is for dc to CIR today.  10-24-14 anticipate CIR tomorrow, wil follow for any needs. Carles Collet RN BSN CM  10/23/14 1402 Tomi Bamberger RN, BSN [phone] patient lives with spouse, per physcial therapy eval recs CIR, also will work on back up for snf.

## 2014-10-23 NOTE — Progress Notes (Signed)
Inpatient Rehabilitation  Patient was screened by Caileen Veracruz for appropriateness for an Inpatient Acute Rehab consult.  At this time, we are recommending Inpatient Rehab consult.  Please order when you feel appropriate.   Zebulen Simonis PT Inpatient Rehab Admissions Coordinator Cell [phone] Office [phone]   

## 2014-10-23 NOTE — Progress Notes (Signed)
Patient ID: [name], male   DOB: Dec 08, 1950, 64 y.o.   MRN: [id number] Stable with neck pain and shoulder pain strength is still improved from Saturday  Continue IV antibiotics

## 2014-10-23 NOTE — Progress Notes (Signed)
Brief Nutrition Follow-Up Note  Pt has been advanced to a Heart Healthy/Carb Modified diet. Currently consuming 50-100% of meals. He has orders for Resource Breeze po TID, each supplement provides 250 kcal and 9 grams of protein. Per MAR, pt is accepting supplement. Will d/c Resource Breeze due to improved PO intake and diet advancement. Will add Glucerna Shake po BID, each supplement provides 220 kcal and 10 grams of protein.  RD will continue to follow.   Shenequa Howse A. Jimmye Norman, RD, LDN, CDE Pager: [phone] After hours Pager: [phone]

## 2014-10-23 NOTE — Evaluation (Signed)
Occupational Therapy Evaluation Patient Details Name: [name] MRN: [id number] DOB: [birth year] Today's Date: 10/23/2014    History of Present Illness pt is a 64 y/o male admitted from [facility] with neck pain.over last 2 weeks.  MRI shows cervical epidural abscess.   PMHx:  cirrhosis, fatty liver   Clinical Impression   This 64 yo male admitted with above presents to acute OT with increased pain, decreased mobility, decreased balance, decreased coordination, decreased strength x4 all affecting his ability to care for himself at an independent level as he was pta. He will benefit from acute OT with follow up on CIR to get back to an independent level.    Follow Up Recommendations  CIR    Equipment Recommendations   (TBD at next venue)       Precautions / Restrictions Precautions Precautions: Fall Restrictions Weight Bearing Restrictions: No      Mobility Bed Mobility Overal bed mobility: Needs Assistance Bed Mobility: Rolling;Sidelying to Sit Rolling: Min assist Sidelying to sit: Min assist       General bed mobility comments: cues for direction  Transfers Overall transfer level: Needs assistance   Transfers: Sit to/from Stand Sit to Stand: Min guard         General transfer comment: guard for safety, some stabilization against the bed    Balance Overall balance assessment: Needs assistance Sitting-balance support: No upper extremity supported Sitting balance-Leahy Scale: Fair     Standing balance support: No upper extremity supported Standing balance-Leahy Scale: Fair                              ADL Overall ADL's : Needs assistance/impaired Eating/Feeding: Set up;Sitting   Grooming: Minimal assistance;Sitting   Upper Body Bathing: Set up;Sitting   Lower Body Bathing: Moderate assistance (with min A sit<>stand)   Upper Body Dressing : Moderate assistance;Sitting   Lower Body Dressing: Moderate assistance (with min A  sit<>stand)   Toilet Transfer: Minimal assistance;Ambulation;RW;Regular Toilet;Grab bars   Toileting- Clothing Manipulation and Hygiene: Minimal assistance (with min A sit<>stand)               Vision Additional Comments: No change from baseline          Pertinent Vitals/Pain Pain Assessment: 0-10 Pain Score: 10-Worst pain ever Pain Location: left side of neck and left shoulder Pain Descriptors / Indicators: Constant;Aching;Throbbing Pain Intervention(s): Monitored during session;Repositioned;Patient requesting pain meds-RN notified     Hand Dominance Right   Extremity/Trunk Assessment Upper Extremity Assessment Upper Extremity Assessment: RUE deficits/detail;LUE deficits/detail RUE Deficits / Details: Supine: Grossly 3/5 shoulder, rest 4/5 RUE Coordination: decreased fine motor;decreased gross motor LUE Deficits / Details: Supine: Grossly 3/5 shoulder (with a slight drift into abduction as shoulder reaches 90 degrees), 3+/5 rest LUE Coordination: decreased fine motor;decreased gross motor   Lower Extremity Assessment Lower Extremity Assessment: RLE deficits/detail;LLE deficits/detail RLE Deficits / Details: functional, but proximal weakness and lower trunk LLE Deficits / Details: notably weaker at 3+/5,  quick fatiguability LLE Coordination: decreased fine motor   Cervical / Trunk Assessment Cervical / Trunk Assessment: Other exceptions Cervical / Trunk Exceptions: Pt have difficulty moving his neck in all direction with more so to left and upward (due to pain)--encourged him to keep trying to move his neck to keep it from getting any more stiff   Communication Communication Communication: No difficulties   Cognition Arousal/Alertness: Awake/alert Behavior During Therapy: WFL for tasks assessed/performed Overall  Cognitive Status: Within Functional Limits for tasks assessed                                Home Living Family/patient expects to be  discharged to:: Private residence Living Arrangements: Spouse/significant other Available Help at Discharge: Family Type of Home: House Home Access: Stairs to enter Technical brewer of Steps: 2 Entrance Stairs-Rails: None Home Layout: Multi-level Alternate Level Stairs-Number of Steps: flights Alternate Level Stairs-Rails: Right;Left Bathroom Shower/Tub: Teacher, early years/pre: Standard Bathroom Accessibility: Yes   Home Equipment: None   Additional Comments: Works as a Administrator for [employer]      Prior Functioning/Environment Level of Independence: Independent             OT Diagnosis: Generalized weakness;Acute pain   OT Problem List: Decreased strength;Decreased range of motion;Impaired balance (sitting and/or standing);Impaired UE functional use;Pain;Decreased knowledge of use of DME or AE   OT Treatment/Interventions: Self-care/ADL training;Therapeutic exercise;Therapeutic activities;DME and/or AE instruction;Balance training;Patient/family education    OT Goals(Current goals can be found in the care plan section) Acute Rehab OT Goals Patient Stated Goal: independent, back to work OT Goal Formulation: With patient/family Time For Goal Achievement: 11/06/14 Potential to Achieve Goals: Good  OT Frequency: Min 3X/week           Co-evaluation PT/OT/SLP Co-Evaluation/Treatment: Yes Reason for Co-Treatment:  (Pt's increased pain level)   OT goals addressed during session: ADL's and self-care;Strengthening/ROM      End of Session Equipment Utilized During Treatment: Rolling walker;Gait belt  Activity Tolerance: Patient tolerated treatment well Patient left: in chair;with family/visitor present   Time: 1217-1253 OT Time Calculation (min): 36 min Charges:  OT General Charges $OT Visit: 1 Procedure OT Evaluation $Initial OT Evaluation Tier I: 1 Procedure  [name] [phone] 10/23/2014, 1:33 PM

## 2014-10-23 NOTE — Consult Note (Signed)
Physical Medicine and Rehabilitation Consult Reason for Consult: Cervical epidural abscess Referring Physician: [facility]   HPI: [name] is a 64 y.o. right handed male with history of diabetes mellitus peripheral neuropathy, cirrhosis and portal hypertension. Independent prior to admission living with his wife. Presented 10/20/2014 with neck pain 2 weeks and low-grade fever. Blood cultures recently by PCP grew out gram-positive cocci. Patient presented ED at [facility] concern of infection and transferred to [facility] for further evaluation. MRI cervical spine showed epidural abscess with enhancing phlegmonous changes extending from the level of C1 inferiorly to approximately the level CVI and 7.. No evidence of osteomyelitis  discitis. Follow-up neurosurgery Dr. Saintclair Halsted with conservative care. Concerns of question left-sided neglect with CT of the head 10/21/2014 negative for acute changes. Infectious disease consulted follow-up group B streptococcal bacteremia and maintain on high doses of Rocephin. Echocardiogram with sclerotic aortic valve and ejection fraction of 70%. Hospital course pain management. Physical therapy evaluation completed 10/23/2014 with recommendations of physical medicine rehabilitation consult.   Review of Systems  Constitutional: Positive for fever.  Gastrointestinal:       GERD  Musculoskeletal: Positive for myalgias and neck pain.  All other systems reviewed and are negative.  Past Medical History  Diagnosis Date  . Reflux esophagitis   . Portal hypertension   . Stricture and stenosis of esophagus   . Diverticulosis of colon (without mention of hemorrhage)   . Internal hemorrhoids without mention of complication   . Benign neoplasm of colon   . Duodenitis without mention of hemorrhage   . 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 Personal history of diseases of blood and blood-forming organs   . Cirrhosis of liver without  mention of alcohol   . Unspecified essential hypertension   . Obesity, unspecified   . Type II or unspecified type diabetes mellitus without mention of complication, not stated as uncontrolled   . GERD (gastroesophageal reflux disease)   . Hemorrhoids   . Colon polyps   . Pancytopenia   . Calculus of kidney yrs ago   Past Surgical History  Procedure Laterality Date  . Kidney stone surgery  yrs ago  . Esophagogastroduodenoscopy  08/01/2011    Procedure: ESOPHAGOGASTRODUODENOSCOPY (EGD);  Surgeon: Scarlette Shorts, MD;  Location: [facility];  Service: Endoscopy;  Laterality: N/A;  . Esophagogastroduodenoscopy N/A 10/27/2012    Procedure: ESOPHAGOGASTRODUODENOSCOPY (EGD);  Surgeon: Inda Castle, MD;  Location: [facility];  Service: Endoscopy;  Laterality: N/A;  . Esophagogastroduodenoscopy N/A 11/22/2012    Procedure: ESOPHAGOGASTRODUODENOSCOPY (EGD);  Surgeon: Irene Shipper, MD;  Location: [facility];  Service: Endoscopy;  Laterality: N/A;  . Colonoscopy    . Upper gastrointestinal endoscopy    . Esophagogastroduodenoscopy N/A 11/29/2013    Procedure: ESOPHAGOGASTRODUODENOSCOPY (EGD);  Surgeon: Irene Shipper, MD;  Location: [facility];  Service: Endoscopy;  Laterality: N/A;  . Esophageal banding Bilateral 11/29/2013    Procedure: ESOPHAGEAL BANDING;  Surgeon: Irene Shipper, MD;  Location: [facility];  Service: Endoscopy;  Laterality: Bilateral;  . Esophagogastroduodenoscopy N/A 06/13/2014    Procedure: ESOPHAGOGASTRODUODENOSCOPY (EGD);  Surgeon: Irene Shipper, MD;  Location: [facility];  Service: Endoscopy;  Laterality: N/A;  . Esophageal banding N/A 06/13/2014    Procedure: ESOPHAGEAL BANDING;  Surgeon: Irene Shipper, MD;  Location: [facility];  Service: Endoscopy;  Laterality: N/A;   Family History  Problem Relation Age of Onset  . Colon cancer Neg Hx   .  Anesthesia problems Neg Hx   . Hypotension Neg Hx   . Malignant hyperthermia Neg Hx   . Pseudochol deficiency Neg Hx   .  Diabetes Father   . Heart disease Father   . Aneurysm Mother   . Heart disease Other     neice  . Diabetes      niece   Social History:  reports that he has never smoked. He has never used smokeless tobacco. He reports that he does not drink alcohol or use illicit drugs. Allergies: No Known Allergies Medications Prior to Admission  Medication Sig Dispense Refill  . canagliflozin (INVOKANA) 100 MG TABS tablet Take 100 mg by mouth daily.    . cetirizine (ZYRTEC) 10 MG tablet Take 10 mg by mouth daily as needed for allergies.     Marland Kitchen doxycycline (VIBRA-TABS) 100 MG tablet Take 100 mg by mouth 2 (two) times daily.    . Dulaglutide 0.75 MG/0.5ML SOPN Inject 0.75 mg into the skin once a week.    . gabapentin (NEURONTIN) 100 MG capsule Take 100 mg by mouth daily.     Marland Kitchen JANUVIA 100 MG tablet Take 100 mg by mouth daily.    Marland Kitchen linagliptin (TRADJENTA) 5 MG TABS tablet Take 5 mg by mouth every morning.     Marland Kitchen LIVER EXTRACT PO Take by mouth daily.    . Magnesium 250 MG TABS Take 1 tablet by mouth every morning.     . milk thistle 175 MG tablet Take 175 mg by mouth every morning.     . pantoprazole (PROTONIX) 40 MG tablet Take 1 tablet (40 mg total) by mouth daily. (Patient taking differently: Take 40 mg by mouth every morning. ) 30 tablet 6  . Potassium 99 MG TABS Take 1 tablet by mouth every morning.     . propranolol (INDERAL) 20 MG tablet TAKE 1 TABLET (20 MG TOTAL) BY MOUTH DAILY. 30 tablet 9  . INVOKANA 300 MG TABS tablet Take 300 mg by mouth daily.    . Vitamin D, Ergocalciferol, (DRISDOL) 50000 UNITS CAPS Take 50,000 Units by mouth every 7 (seven) days.       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[employer]: Prior Function Level of Independence: Independent Functional Status:  Mobility: Bed Mobility Overal bed mobility: Needs Assistance Bed Mobility: Rolling, Sidelying to Sit Rolling: Min assist Sidelying to sit: [place] bed mobility comments: cues for direction Transfers Overall transfer level: Needs assistance Transfers: Sit to/from Stand Sit to Stand: Min guard General transfer comment: guard for safety, some stabilization against the bed Ambulation/Gait Ambulation/Gait assistance: Min assist Ambulation Distance (Feet): 110 Feet Assistive device: Rolling walker (2 wheeled), None Gait Pattern/deviations: Step-through pattern, Trunk flexed Gait velocity: slower General Gait Details: mildly unsteady gait L LE worse than R, worsening with fatigue, some foot slap and visible incoordination L LE    ADL: ADL Overall ADL's : Needs assistance/impaired Eating/Feeding: Set up, Sitting Grooming: Minimal assistance, Sitting Upper Body Bathing: Set up, Sitting Lower Body Bathing: Moderate assistance (with min A sit<>stand) Upper Body Dressing : Moderate assistance, Sitting Lower Body Dressing: Moderate assistance (with min A sit<>stand) Toilet  Transfer: Minimal assistance, Ambulation, RW, Regular Toilet, Grab bars Toileting- Clothing Manipulation and Hygiene: Minimal assistance (with min A sit<>stand)  Cognition: Cognition Overall Cognitive Status: Within Functional Limits for tasks assessed Orientation Level: Oriented to person, Oriented to situation, Disoriented to time, Oriented to place Cognition Arousal/Alertness: Awake/alert Behavior During Therapy: [facility] for tasks assessed/performed Overall Cognitive Status: Within Functional Limits for tasks assessed  Blood pressure 132/63, pulse 60, temperature 98 F  (36.7 C), temperature source Oral, resp. rate 18, height 5\' 11"  (1.803 m), weight 90.9 kg (200 lb 6.4 oz), SpO2 96 %. Physical Exam  Vitals reviewed. Constitutional: He is oriented to person, place, and time. He appears well-developed.  HENT:  Head: Normocephalic.  Eyes: EOM are normal.  Neck: Normal range of motion. Neck supple. No thyromegaly present.  Cardiovascular: Normal rate and regular rhythm.   Respiratory: Effort normal and breath sounds normal. No respiratory distress.  GI: Soft. Bowel sounds are normal. He exhibits no distension.  Neurological: He is alert and oriented to person, place, and time.  Voice is weak but intelligible.  Skin: Skin is warm and dry.    Results for orders placed or performed during the hospital encounter of 10/20/14 (from the past 24 hour(s))  Glucose, capillary     Status: Abnormal   Collection Time: 10/22/14  5:39 PM  Result Value Ref Range   Glucose-Capillary 182 (H) 70 - 99 mg/dL  Glucose, capillary     Status: Abnormal   Collection Time: 10/22/14 10:17 PM  Result Value Ref Range   Glucose-Capillary 146 (H) 70 - 99 mg/dL  Basic metabolic panel     Status: Abnormal   Collection Time: 10/23/14  7:13 AM  Result Value Ref Range   Sodium 132 (L) 135 - 145 mmol/L   Potassium 3.7 3.5 - 5.1 mmol/L   Chloride 98 96 - 112 mmol/L   CO2 22 19 - 32 mmol/L   Glucose, Bld 125 (H) 70 - 99 mg/dL   BUN 17 6 - 23 mg/dL   Creatinine, Ser 0.70 0.50 - 1.35 mg/dL   Calcium 8.1 (L) 8.4 - 10.5 mg/dL   GFR calc non Af Amer >90 >90 mL/min   GFR calc Af Amer >90 >90 mL/min   Anion gap 12 5 - 15  CBC     Status: Abnormal   Collection Time: 10/23/14  7:13 AM  Result Value Ref Range   WBC 12.4 (H) 4.0 - 10.5 K/uL   RBC 3.88 (L) 4.22 - 5.81 MIL/uL   Hemoglobin 11.7 (L) 13.0 - 17.0 g/dL   HCT 34.3 (L) 39.0 - 52.0 %   MCV 88.4 78.0 - 100.0 fL   MCH 30.2 26.0 - 34.0 pg   MCHC 34.1 30.0 - 36.0 g/dL   RDW 15.3 11.5 - 15.5 %   Platelets 26 (LL) 150 - 400 K/uL    Glucose, capillary     Status: Abnormal   Collection Time: 10/23/14  7:44 AM  Result Value Ref Range   Glucose-Capillary 114 (H) 70 - 99 mg/dL  Glucose, capillary     Status: Abnormal   Collection Time: 10/23/14 11:43 AM  Result Value Ref Range   Glucose-Capillary 184 (H) 70 - 99 mg/dL  Urinalysis, Routine w reflex microscopic     Status: Abnormal   Collection Time: 10/23/14  1:25 PM  Result Value Ref Range   Color, Urine AMBER (A) YELLOW   APPearance CLEAR CLEAR   Specific Gravity, Urine 1.035 (H) 1.005 - 1.030  pH 6.0 5.0 - 8.0   Glucose, UA >1000 (A) NEGATIVE mg/dL   Hgb urine dipstick NEGATIVE NEGATIVE   Bilirubin Urine NEGATIVE NEGATIVE   Ketones, ur 40 (A) NEGATIVE mg/dL   Protein, ur NEGATIVE NEGATIVE mg/dL   Urobilinogen, UA 1.0 0.0 - 1.0 mg/dL   Nitrite NEGATIVE NEGATIVE   Leukocytes, UA NEGATIVE NEGATIVE  Urine microscopic-add on     Status: None   Collection Time: 10/23/14  1:25 PM  Result Value Ref Range   Squamous Epithelial / LPF RARE RARE   WBC, UA 0-2 <3 WBC/hpf   RBC / HPF 3-6 <3 RBC/hpf   Bacteria, UA RARE RARE   No results found.  Assessment/Plan: Diagnosis: cervical epidural abscess 1. Does the need for close, 24 hr/day medical supervision in concert with the patient's rehab needs make it unreasonable for this patient to be served in a less intensive setting? Potentially 2. Co-Morbidities requiring supervision/potential complications: encephalopathy 3. Due to bladder management, bowel management, safety, skin/wound care, disease management, medication administration, pain management and patient education, does the patient require 24 hr/day rehab nursing? Yes 4. Does the patient require coordinated care of a physician, rehab nurse, PT (1-2 hrs/day, 5 days/week), OT (1-2 hrs/day, 5 days/week) and SLP (1-2 hrs/day, 5 days/week) to address physical and functional deficits in the context of the above medical diagnosis(es)? Yes Addressing deficits in the  following areas: balance, endurance, locomotion, strength, transferring, bowel/bladder control, bathing, dressing, feeding, grooming, toileting, cognition and psychosocial support 5. Can the patient actively participate in an intensive therapy program of at least 3 hrs of therapy per day at least 5 days per week? Yes 6. The potential for patient to make measurable gains while on inpatient rehab is excellent 7. Anticipated functional outcomes upon discharge from inpatient rehab are modified independent and supervision  with PT, modified independent and supervision with OT, modified independent and supervision with SLP. 8. Estimated rehab length of stay to reach the above functional goals is: 7 days 9. Does the patient have adequate social supports and living environment to accommodate these discharge functional goals? Yes 10. Anticipated D/C setting: Home 11. Anticipated post D/C treatments: HH therapy and Outpatient therapy 12. Overall Rehab/Functional Prognosis: excellent  RECOMMENDATIONS: This patient's condition is appropriate for continued rehabilitative care in the following setting: CIR Patient has agreed to participate in recommended program. Yes Note that insurance prior authorization may be required for reimbursement for recommended care.  Comment: Rehab Admissions Coordinator to follow up.  Thanks,  Meredith Staggers, MD, Mellody Drown     10/23/2014

## 2014-10-24 LAB — GLUCOSE, CAPILLARY
GLUCOSE-CAPILLARY: 138 mg/dL — AB (ref 70–99)
GLUCOSE-CAPILLARY: 153 mg/dL — AB (ref 70–99)
GLUCOSE-CAPILLARY: 154 mg/dL — AB (ref 70–99)
Glucose-Capillary: 196 mg/dL — ABNORMAL HIGH (ref 70–99)

## 2014-10-24 LAB — PREALBUMIN

## 2014-10-24 MED ORDER — POLYETHYLENE GLYCOL 3350 17 G PO PACK
17.0000 g | PACK | Freq: Every day | ORAL | Status: DC
  Administered 2014-10-24 – 2014-10-25 (×2): 17 g via ORAL
  Filled 2014-10-24 (×2): qty 1

## 2014-10-24 MED ORDER — CEFTRIAXONE SODIUM IN DEXTROSE 40 MG/ML IV SOLN
2.0000 g | Freq: Two times a day (BID) | INTRAVENOUS | Status: DC
  Administered 2014-10-24 – 2014-10-25 (×2): 2 g via INTRAVENOUS
  Filled 2014-10-24 (×3): qty 50

## 2014-10-24 MED ORDER — HYDROCODONE-ACETAMINOPHEN 5-325 MG PO TABS
1.0000 | ORAL_TABLET | Freq: Four times a day (QID) | ORAL | Status: DC | PRN
  Administered 2014-10-24 – 2014-10-25 (×3): 2 via ORAL
  Filled 2014-10-24 (×3): qty 2

## 2014-10-24 MED ORDER — SENNOSIDES-DOCUSATE SODIUM 8.6-50 MG PO TABS
1.0000 | ORAL_TABLET | Freq: Two times a day (BID) | ORAL | Status: DC
  Administered 2014-10-24 – 2014-10-25 (×3): 1 via ORAL
  Filled 2014-10-24 (×2): qty 1

## 2014-10-24 MED ORDER — BISACODYL 10 MG RE SUPP
10.0000 mg | Freq: Every day | RECTAL | Status: DC | PRN
  Administered 2014-10-24: 10 mg via RECTAL
  Filled 2014-10-24: qty 1

## 2014-10-24 MED ORDER — SODIUM CHLORIDE 0.9 % IJ SOLN
10.0000 mL | INTRAMUSCULAR | Status: DC | PRN
  Administered 2014-10-24: 10 mL
  Filled 2014-10-24: qty 40

## 2014-10-24 MED ORDER — FENTANYL 12 MCG/HR TD PT72
12.5000 ug | MEDICATED_PATCH | TRANSDERMAL | Status: DC
  Administered 2014-10-24: 12.5 ug via TRANSDERMAL
  Filled 2014-10-24: qty 1

## 2014-10-24 MED ORDER — CEFTRIAXONE SODIUM IN DEXTROSE 40 MG/ML IV SOLN
2.0000 g | INTRAVENOUS | Status: DC
Start: 2014-10-24 — End: 2014-10-24
  Administered 2014-10-24: 2 g via INTRAVENOUS
  Filled 2014-10-24 (×2): qty 50

## 2014-10-24 NOTE — Progress Notes (Addendum)
Late entry:     [facility] for Infectious Disease  Date of Admission:  10/20/2014  Antibiotics: Ceftriaxone 2 grams every 12 hours  Subjective: Neck pain but no increase  Objective: Temp:  [98.3 F (36.8 C)-98.6 F (37 C)] 98.3 F (36.8 C) (04/19 0541) Pulse Rate:  [66-67] 67 (04/19 0541) Resp:  [18] 18 (04/19 0541) BP: (136-158)/(64-72) 136/64 mmHg (04/19 0541) SpO2:  [93 %-96 %] 93 % (04/19 0541)  General: awake, in bed, difficult to move neck Skin: no rashes Lungs: CTA B Cor: RRR Abdomen: soft, nt, nd   Lab Results Lab Results  Component Value Date   WBC 12.4* 10/23/2014   HGB 11.7* 10/23/2014   HCT 34.3* 10/23/2014   MCV 88.4 10/23/2014   PLT 26* 10/23/2014    Lab Results  Component Value Date   CREATININE 0.70 10/23/2014   BUN 17 10/23/2014   NA 132* 10/23/2014   K 3.7 10/23/2014   CL 98 10/23/2014   CO2 22 10/23/2014    Lab Results  Component Value Date   ALT 15 10/22/2014   AST 26 10/22/2014   ALKPHOS 84 10/22/2014   BILITOT 2.4* 10/22/2014      Microbiology: Recent Results (from the past 240 hour(s))  Culture, blood (routine x 2)     Status: None (Preliminary result)   Collection Time: 10/21/14  4:46 AM  Result Value Ref Range Status   Specimen Description BLOOD RIGHT ASSIST CONTROL  Final   Special Requests BOTTLES DRAWN AEROBIC AND ANAEROBIC 10 CC  Final   Culture   Final           BLOOD CULTURE RECEIVED NO GROWTH TO DATE CULTURE WILL BE HELD FOR 5 DAYS BEFORE ISSUING A FINAL NEGATIVE REPORT Performed at [facility]    Report Status PENDING  Incomplete  Culture, blood (routine x 2)     Status: None (Preliminary result)   Collection Time: 10/21/14  4:53 AM  Result Value Ref Range Status   Specimen Description BLOOD RIGHT HAND  Final   Special Requests BOTTLES DRAWN AEROBIC ONLY 7 CC  Final   Culture   Final           BLOOD CULTURE RECEIVED NO GROWTH TO DATE CULTURE WILL BE HELD FOR 5 DAYS BEFORE ISSUING A FINAL NEGATIVE  REPORT Performed at [facility]    Report Status PENDING  Incomplete    Studies/Results: No results found.  Assessment/Plan:  1) Group B Streptococcal cervical epidural abscess - will need prolonged course of IV ceftriaxone 2 grams every 24 hours.  Currently q 12 hours and can continue with that while he is in house and in rehab.  At least 8 weeks and repeat MRI at that time in 6-8 weeks.   Baseline ESR 66, CRP 19.1 He can continue with q12 dosing while in rehab and then at discharge from rehab change ceftriaxone dose to daily 2 grams through June 9 We will arrange follow up in [facility] in about 4 weeks To go to rehab for about 7 days   Scharlene Gloss, [facility] [place] [email or web address] [phone] pager   [phone] cell 10/24/2014, 9:03 AM

## 2014-10-24 NOTE — Clinical Social Work Psychosocial (Signed)
Clinical Social Work Department BRIEF PSYCHOSOCIAL ASSESSMENT 10/24/2014  Patient:  [name], [name]     Account Number:  [id number]     Admit date:  10/20/2014  Clinical Social Worker:  Kingsley Spittle, CLINICAL SOCIAL WORKER  Date/Time:  10/24/2014 04:01 PM  Referred by:  Physician  Date Referred:  10/24/2014 Referred for  SNF Placement   Other Referral:   N/A   Interview type:  Family Other interview type:   Jonathan [phone]  Patient's friend was also at bedside    PSYCHOSOCIAL DATA Living Status:  WIFE Admitted from facility:   Level of care:   Primary support name:  [name] Primary support relationship to patient:  SPOUSE Degree of support available:   Support is strong.    CURRENT CONCERNS Current Concerns  Post-Acute Placement   Other Concerns:   N/A    SOCIAL WORK ASSESSMENT / PLAN BSW intern spoke with patient's wife and friend while at bedside. BSW explained the CIR and SNF process to wife and friend however it seemed as though their minds were already made up of going to CIR. They mentioned multiple times that SNF would not be able to take care of him because his needs are to much and that CIR is where he will be going. BSW explained that SNF could be a back-up plan. The wife and friend were still resistant on letting me send out his information to [facility].   Assessment/plan status:  Psychosocial Support/Ongoing Assessment of Needs Other assessment/ plan:   N/A   Information/referral to community resources:   CSW information given to patient's wife.    PATIENT'S/FAMILY'S RESPONSE TO PLAN OF CARE: Patient's wife and friend were very resistant to the idea of SNF. Patient's wife was very kind however seemed swayed by the friends opinion. Ongoing assessment may be needed.      Kingsley Spittle, [place] Intern, [phone]

## 2014-10-24 NOTE — Progress Notes (Signed)
PT Cancellation Note  Patient Details Name: [name] MRN: [id number] DOB: 05-May-1951   Cancelled Treatment:    Reason Eval/Treat Not Completed: Patient declined, no reason specified.  Pt stated he felt weak and constipated. 10/24/2014  Donnella Sham, [place] [phone]  (pager)   [name], [name] 10/24/2014, 6:09 PM

## 2014-10-24 NOTE — Progress Notes (Signed)
Rehab admissions - I have approval for acute inpatient rehab admission for tomorrow.  I will follow up in am.  Pending bed availability, I am hoping to admit to acute inpatient rehab tomorrow.  Call me for questions.  #[phone]

## 2014-10-24 NOTE — Progress Notes (Signed)
Utilization review complete 

## 2014-10-24 NOTE — Progress Notes (Signed)
Peripherally Inserted Central Catheter/Midline Placement  The IV Nurse has discussed with the patient and/or persons authorized to consent for the patient, the purpose of this procedure and the potential benefits and risks involved with this procedure.  The benefits include less needle sticks, lab draws from the catheter and patient may be discharged home with the catheter.  Risks include, but not limited to, infection, bleeding, blood clot (thrombus formation), and puncture of an artery; nerve damage and irregular heat beat.  Alternatives to this procedure were also discussed.  PICC/Midline Placement Documentation        Darlyn Read 10/24/2014, 12:18 PM

## 2014-10-24 NOTE — PMR Pre-admission (Signed)
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 Date: 07/07/14     Deduct: $1500 (met)      Out of Pocket Max: [phone] (Met [phone])      Life Max: Unlimited CIR: 80% w/auth      SNF: 80% w/auth and 100 days max Outpatient: 80%     Co-Pay: 20% Home Health: 80% with 60 days max     Co-Pay: 20% DME: 80%     Co-Pay: 20% Providers: in Therapist, art Information    Name Relation Home Work Mobile   [place] Spouse [phone]  [phone]     Current Medical History  Patient Admitting Diagnosis:  Cervical epidural abscess  History of Present Illness: A 64 y.o. right handed male with history of diabetes mellitus peripheral neuropathy, cirrhosis and portal hypertension. Independent prior to admission living with his wife. Presented 10/20/2014 with neck pain 2 weeks and low-grade fever. Blood cultures recently by PCP grew out gram-positive cocci. Patient presented ED at [facility] concern of infection and transferred to [facility] for further evaluation. MRI cervical spine showed epidural abscess with enhancing phlegmonous changes extending from the level of C1 inferiorly to approximately the level CVI and 7.. No evidence of osteomyelitis discitis. Follow-up neurosurgery Dr. Saintclair Halsted with conservative care. Concerns of question left-sided neglect with CT of the head 10/21/2014  negative for acute changes. Infectious disease consulted follow-up group B streptococcal bacteremia and maintain on high doses of Rocephin. Echocardiogram with sclerotic aortic valve and ejection fraction of 70%. Hospital course pain management. Physical therapy evaluation completed 10/23/2014 with recommendations of physical medicine rehabilitation consult.    Past Medical History  Past Medical History  Diagnosis Date  . Reflux esophagitis   . Portal hypertension   . Stricture and stenosis of esophagus   . Diverticulosis of colon (without mention of hemorrhage)   . Internal hemorrhoids without mention of complication   . Benign neoplasm of colon   . Duodenitis without mention of hemorrhage   . 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 Personal history of diseases of blood and blood-forming organs   . Cirrhosis of liver without mention of alcohol   . Unspecified essential hypertension   . Obesity, unspecified   . Type II or unspecified type diabetes mellitus without mention of complication, not stated as uncontrolled   . GERD (gastroesophageal reflux disease)   . Hemorrhoids   . Colon polyps   . Pancytopenia   . Calculus of kidney yrs ago    Family History  family history includes Aneurysm in his mother; Diabetes in his father and another  family member; Heart disease in his father and other. There is no history of Colon cancer, Anesthesia problems, Hypotension, Malignant hyperthermia, or Pseudochol deficiency.  Prior Rehab/Hospitalizations:  Had outpatient therapy in [place] after he pulled a muscle.   Current Medications   Current facility-administered medications:  .  0.9 %  sodium chloride infusion, 250 mL, Intravenous, PRN, Jani Gravel, MD .  acetaminophen (TYLENOL) tablet 650 mg, 650 mg, Oral, Q6H PRN, Delfina Redwood, MD .  bisacodyl (DULCOLAX) suppository 10 mg, 10 mg, Rectal, Daily PRN, Geradine Girt, DO, 10 mg at 10/24/14 1730 .  cefTRIAXone (ROCEPHIN) 2 g in  dextrose 5 % 50 mL IVPB - Premix, 2 g, Intravenous, Q12H, Thayer Headings, MD, 2 g at 10/24/14 2104 .  feeding supplement (GLUCERNA SHAKE) (GLUCERNA SHAKE) liquid 237 mL, 237 mL, Oral, BID BM, Jenifer A Williams, RD, 237 mL at 10/24/14 1334 .  fentaNYL (DURAGESIC - dosed mcg/hr) 12.5 mcg, 12.5 mcg, Transdermal, Q72H, Geradine Girt, DO, 12.5 mcg at 10/24/14 0917 .  HYDROcodone-acetaminophen (NORCO/VICODIN) 5-325 MG per tablet 1-2 tablet, 1-2 tablet, Oral, Q6H PRN, Geradine Girt, DO, 2 tablet at 10/24/14 2337 .  insulin aspart (novoLOG) injection 0-5 Units, 0-5 Units, Subcutaneous, QHS, Jani Gravel, MD, 0 Units at 10/20/14 2315 .  insulin aspart (novoLOG) injection 0-9 Units, 0-9 Units, Subcutaneous, TID WC, Jani Gravel, MD, 1 Units at 10/25/14 [phone] .  morphine 2 MG/ML injection 1 mg, 1 mg, Intravenous, Q3H PRN, Delfina Redwood, MD, 1 mg at 10/25/14 0139 .  pantoprazole (PROTONIX) EC tablet 40 mg, 40 mg, Oral, q morning - 10a, Jani Gravel, MD, 40 mg at 10/24/14 0916 .  polyethylene glycol (MIRALAX / GLYCOLAX) packet 17 g, 17 g, Oral, Daily, Jessica U Vann, DO, 17 g at 10/24/14 1250 .  propranolol (INDERAL) tablet 20 mg, 20 mg, Oral, q morning - 10a, Jani Gravel, MD, 20 mg at 10/24/14 0916 .  senna-docusate (Senokot-S) tablet 1 tablet, 1 tablet, Oral, BID, Geradine Girt, DO, 1 tablet at 10/24/14 2104 .  sodium chloride 0.9 % injection 10-40 mL, 10-40 mL, Intracatheter, PRN, Kary Kos, MD, 10 mL at 10/24/14 2025 .  sodium chloride 0.9 % injection 3 mL, 3 mL, Intravenous, Q12H, Jani Gravel, MD, 3 mL at 10/24/14 1250 .  sodium chloride 0.9 % injection 3 mL, 3 mL, Intravenous, Q12H, Jani Gravel, MD, 3 mL at 10/24/14 0941 .  sodium chloride 0.9 % injection 3 mL, 3 mL, Intravenous, PRN, Jani Gravel, MD .  sodium phosphate (FLEET) 7-19 GM/118ML enema 1 enema, 1 enema, Rectal, Once, Geradine Girt, DO .  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  Went out daily.   Home Assistive Devices / Equipment Home Assistive Devices/Equipment: None Home Equipment: None  Prior Functional Level Prior Function Level of Independence: Independent  Current Functional Level Cognition  Overall Cognitive Status: Within Functional Limits for tasks assessed Orientation Level: Oriented to person, Oriented to situation, Disoriented to time, Oriented to place    Extremity Assessment (includes Sensation/Coordination)  Upper Extremity Assessment: RUE deficits/detail, LUE deficits/detail RUE Deficits / Details: Supine: Grossly 3/5 shoulder, rest 4/5 RUE Coordination: decreased fine motor,  decreased gross motor LUE Deficits / Details: Supine: Grossly 3/5 shoulder (with a slight drift into abduction as shoulder reaches 90 degrees), 3+/5 rest LUE Coordination: decreased fine motor, decreased gross motor  Lower Extremity Assessment: RLE deficits/detail, LLE deficits/detail RLE Deficits / Details: functional, but proximal weakness and lower trunk LLE Deficits / Details: notably weaker at 3+/5,  quick fatiguability LLE Coordination: decreased fine motor    ADLs  Overall ADL's : Needs assistance/impaired Eating/Feeding: Set up, Sitting Grooming: Minimal assistance, Sitting Upper Body Bathing: Set up, Sitting Lower Body Bathing: Moderate assistance (with min A sit<>stand) Upper Body Dressing : Moderate assistance, Sitting Lower Body Dressing: Moderate assistance (with min A sit<>stand) Toilet Transfer: Minimal assistance, Ambulation, RW, Regular Toilet, Grab bars Toileting- Clothing Manipulation and Hygiene: Minimal assistance (with  min A sit<>stand)    Mobility  Overal bed mobility: Needs Assistance Bed Mobility: Rolling, Sidelying to Sit Rolling: Min assist Sidelying to sit: Min assist General bed mobility comments: cues for direction    Transfers  Overall transfer level: Needs assistance Transfers: Sit to/from Stand Sit to Stand: Min guard General transfer comment: guard for safety, some stabilization against the bed    Ambulation / Gait / Stairs / Wheelchair Mobility  Ambulation/Gait Ambulation/Gait assistance: Museum/gallery curator (Feet): 110 Feet Assistive device: Rolling walker (2 wheeled), None Gait Pattern/deviations: Step-through pattern, Trunk flexed Gait velocity: slower General Gait Details: mildly unsteady gait L LE worse than R, worsening with fatigue, some foot slap and visible incoordination L LE    Posture / Balance Balance Overall balance assessment: Needs assistance Sitting-balance support: No upper extremity supported Sitting balance-Leahy Scale: Fair Standing balance support: No upper extremity supported Standing balance-Leahy Scale: Fair    Special needs/care consideration BiPAP/CPAP No CPM No Continuous Drip IV No Dialysis No        Life Vest No Oxygen No Special Bed No Trach Size No Wound Vac (area) No      Skin No                               Bowel mgmt: Last BM 10/21/14 Bladder mgmt: Voiding in urinal WDL Diabetic mgmt Yes, on oral medications at home.    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 Does the patient have any problems obtaining your medications?: No  Social/Family/Support Systems Patient Roles: Spouse, Parent (Has a wife, a son and a daughter.) Contact Information: Dijon Cosens - wife [phone] Anticipated Caregiver: wife Ability/Limitations of Caregiver: Wife is not working and can provide supervision at home Caregiver Availability: 24/7 Discharge Plan Discussed with Primary Caregiver: Yes Is Caregiver In Agreement with Plan?: Yes Does Caregiver/Family have Issues  with Lodging/Transportation while Pt is in Rehab?: No  Goals/Additional Needs Patient/Family Goal for Rehab: PT/OT/ST mod I and supervision goals Expected length of stay: 7 days Cultural Considerations: None Dietary Needs: Heart Healthy, carb mod, thin liquids Equipment Needs: TBD Pt/Family Agrees to Admission and willing to participate: Yes Program Orientation Provided & Reviewed with Pt/Caregiver Including Roles  & Responsibilities: Yes  Decrease burden of Care through IP rehab admission: N/A  Possible need for SNF placement upon discharge: Not anticipated  Patient Condition: This patient's condition remains as documented in the consult dated 10/23/14, in which the Rehabilitation Physician determined and documented that the patient's condition is appropriate for intensive rehabilitative care in an inpatient rehabilitation facility. Will admit to inpatient rehab today.  Preadmission Screen Completed  By:  Retta Diones, 10/25/2014 9:59 AM ______________________________________________________________________   Discussed status with Dr. Naaman Plummer on 10/25/14 at 55 and received telephone approval for admission today.  Admission Coordinator:  Retta Diones, time1000/Date04/20/16

## 2014-10-24 NOTE — Progress Notes (Signed)
[facility] PROGRESS NOTE  [name] QBH:[id number] DOB: Feb 06, 1951 DOA: 10/20/2014 PCP: Jonathan Reichert, MD  Summary 64 year old male with history of diabetes and nonalcoholic fatty liver disease/cirrhosis instructed to come to the emergency room after outpatient blood cultures came back positive for gram-positive cocci in pairs and chains from aerobic and anaerobic bottles. Was found to be encephalopathic and have a paraspinal and epidural cervical abscess. Neurosurgery and infectious disease following.  Assessment/Plan:    cervical Epidural/paraspinous abscess: Group B Streptococcal:  Per IR, nothing safe to aspirate.  4/16  -[facility] [phone] regarding specimen #[id number] (blood culture). Still just preliminary results recovered from aerobic and anaerobic bottles, gram-positive cocci in pairs and chains.  On vancomycin and ceftriaxone per ID recommendations.   Neck pain -add fentanyl patch -increase PO pain meds    Encephalopathy acute:   -resolved- prob due to infection    Bacteremia: Growing gram-positive cocci in pairs and chains -abx per ID: will need prolonged course of IV ceftriaxone 2 grams every 24 hours. Currently q 12 hours and can continue with that while he is in house and in rehab.  At least 8 weeks and repeat MRI at that time in 6-8 weeks.  -echo: Sclerotic aortic valve without stenosis- no TEE as do not want to manipulate neck    NASH (nonalcoholic steatohepatitis)    Other cirrhosis of liver    Diabetes mellitus type 2, uncontrolled:  Sliding scale only for now    Thrombocytopenia   Constipation- PRN miralax, suppository, colase/sennakot  Code Status:  full Family Communication:  Wife at bedside Disposition Plan:  CIR  Consultants:  Neurosurgery  Infectious disease  Procedures:     Antibiotics:  Vancomycin, Rocephin 4/16--  Levofloxacin 4/16-4/17  HPI/Subjective: Still with no BM Neck pain still at least  6/10   Objective: Filed Vitals:   10/24/14 0541  BP: 136/64  Pulse: 67  Temp: 98.3 F (36.8 C)  Resp: 18    Intake/Output Summary (Last 24 hours) at 10/24/14 1108 Last data filed at 10/23/14 2112  Gross per 24 hour  Intake     50 ml  Output    675 ml  Net   -625 ml   Filed Weights   10/21/14 0500 10/22/14 0550 10/23/14 0500  Weight: 88.6 kg (195 lb 5.2 oz) 89.1 kg (196 lb 6.9 oz) 90.9 kg (200 lb 6.4 oz)    Exam:   General:  Up in chair, color improved in face  Cardiovascular: Rrr  Respiratory: Clear  Abdomen: Soft nontender mildly distended  Neurologic: Strength appears symmetric  Basic Metabolic Panel:  Recent Labs Lab 10/21/14 0446 10/22/14 0625 10/23/14 0713  NA 131* 133* 132*  K 4.6 3.8 3.7  CL 96 100 98  CO2 20 21 22   GLUCOSE 156* 145* 125*  BUN 23 21 17   CREATININE 0.89 0.85 0.70  CALCIUM 8.6 8.2* 8.1*   Liver Function Tests:  Recent Labs Lab 10/21/14 0446 10/22/14 0625  AST 30 26  ALT 19 15  ALKPHOS 99 84  BILITOT 3.5* 2.4*  PROT 7.2 6.5  ALBUMIN 2.1* 1.8*   No results for input(s): LIPASE, AMYLASE in the last 168 hours. No results for input(s): AMMONIA in the last 168 hours. CBC:  Recent Labs Lab 10/21/14 0446 10/22/14 0625 10/23/14 0713  WBC 16.7* 12.4* 12.4*  NEUTROABS 14.7* 9.5*  --   HGB 13.2 11.7* 11.7*  HCT 39.0 34.6* 34.3*  MCV 89.2 89.2 88.4  PLT 24* 25* 26*   Cardiac  Enzymes: No results for input(s): CKTOTAL, CKMB, CKMBINDEX, TROPONINI in the last 168 hours. BNP (last 3 results) No results for input(s): BNP in the last 8760 hours.  ProBNP (last 3 results) No results for input(s): PROBNP in the last 8760 hours.  CBG:  Recent Labs Lab 10/23/14 0744 10/23/14 1143 10/23/14 1643 10/23/14 2151 10/24/14 0830  GLUCAP 114* 184* 178* 185* 138*    Recent Results (from the past 240 hour(s))  Culture, blood (routine x 2)     Status: None (Preliminary result)   Collection Time: 10/21/14  4:46 AM  Result Value  Ref Range Status   Specimen Description BLOOD RIGHT ASSIST CONTROL  Final   Special Requests BOTTLES DRAWN AEROBIC AND ANAEROBIC 10 CC  Final   Culture   Final           BLOOD CULTURE RECEIVED NO GROWTH TO DATE CULTURE WILL BE HELD FOR 5 DAYS BEFORE ISSUING A FINAL NEGATIVE REPORT Performed at [facility]    Report Status PENDING  Incomplete  Culture, blood (routine x 2)     Status: None (Preliminary result)   Collection Time: 10/21/14  4:53 AM  Result Value Ref Range Status   Specimen Description BLOOD RIGHT HAND  Final   Special Requests BOTTLES DRAWN AEROBIC ONLY 7 CC  Final   Culture   Final           BLOOD CULTURE RECEIVED NO GROWTH TO DATE CULTURE WILL BE HELD FOR 5 DAYS BEFORE ISSUING A FINAL NEGATIVE REPORT Performed at [facility]    Report Status PENDING  Incomplete     Studies: No results found.  Scheduled Meds: . cefTRIAXone (ROCEPHIN)  IV  2 g Intravenous Q12H  . feeding supplement (GLUCERNA SHAKE)  237 mL Oral BID BM  . fentaNYL  12.5 mcg Transdermal Q72H  . insulin aspart  0-5 Units Subcutaneous QHS  . insulin aspart  0-9 Units Subcutaneous TID WC  . pantoprazole  40 mg Oral q morning - 10a  . polyethylene glycol  17 g Oral Daily  . propranolol  20 mg Oral q morning - 10a  . senna-docusate  1 tablet Oral BID  . sodium chloride  3 mL Intravenous Q12H  . sodium chloride  3 mL Intravenous Q12H  . Vitamin D (Ergocalciferol)  50,000 Units Oral Q Mon   Continuous Infusions:    Time spent: 25 minutes  [name]  [facility] [email or web address], password [facility] 10/24/2014, 11:08 AM  LOS: 4 days

## 2014-10-24 NOTE — Progress Notes (Signed)
Rehab admissions - Evaluated for possible admission.  I met with patient, his wife and his friend at the bedside.  I gave wife rehab booklets and explained inpatient rehab.  Patient and wife are interested in inpatient rehab.  I will begin precert process with [employer] insurance.  I will update all once I hear back from insurance case manager.  Call me for questions.  #[phone] 

## 2014-10-25 ENCOUNTER — Inpatient Hospital Stay (HOSPITAL_COMMUNITY)
Admission: EM | Admit: 2014-10-25 | Discharge: 2014-11-11 | DRG: 095 | Disposition: A | Discharge status: Home Health | Payer: Managed Care, Other (non HMO) | Source: Intra-hospital | Attending: Physical Medicine & Rehabilitation | Admitting: Physical Medicine & Rehabilitation

## 2014-10-25 ENCOUNTER — Inpatient Hospital Stay (HOSPITAL_COMMUNITY): Payer: Managed Care, Other (non HMO)

## 2014-10-25 DIAGNOSIS — G4700 Insomnia, unspecified: Secondary | ICD-10-CM | POA: Diagnosis not present

## 2014-10-25 DIAGNOSIS — K5900 Constipation, unspecified: Secondary | ICD-10-CM | POA: Diagnosis not present

## 2014-10-25 DIAGNOSIS — B951 Streptococcus, group B, as the cause of diseases classified elsewhere: Secondary | ICD-10-CM | POA: Diagnosis not present

## 2014-10-25 DIAGNOSIS — R188 Other ascites: Secondary | ICD-10-CM

## 2014-10-25 DIAGNOSIS — D72829 Elevated white blood cell count, unspecified: Secondary | ICD-10-CM | POA: Diagnosis not present

## 2014-10-25 DIAGNOSIS — R600 Localized edema: Secondary | ICD-10-CM

## 2014-10-25 DIAGNOSIS — R339 Retention of urine, unspecified: Secondary | ICD-10-CM | POA: Diagnosis not present

## 2014-10-25 DIAGNOSIS — E1140 Type 2 diabetes mellitus with diabetic neuropathy, unspecified: Secondary | ICD-10-CM

## 2014-10-25 DIAGNOSIS — G9340 Encephalopathy, unspecified: Secondary | ICD-10-CM

## 2014-10-25 DIAGNOSIS — G9589 Other specified diseases of spinal cord: Secondary | ICD-10-CM | POA: Diagnosis not present

## 2014-10-25 DIAGNOSIS — E669 Obesity, unspecified: Secondary | ICD-10-CM

## 2014-10-25 DIAGNOSIS — M7989 Other specified soft tissue disorders: Secondary | ICD-10-CM | POA: Diagnosis not present

## 2014-10-25 DIAGNOSIS — K7460 Unspecified cirrhosis of liver: Secondary | ICD-10-CM | POA: Diagnosis not present

## 2014-10-25 DIAGNOSIS — E871 Hypo-osmolality and hyponatremia: Secondary | ICD-10-CM

## 2014-10-25 DIAGNOSIS — D696 Thrombocytopenia, unspecified: Secondary | ICD-10-CM | POA: Diagnosis not present

## 2014-10-25 DIAGNOSIS — G061 Intraspinal abscess and granuloma: Principal | ICD-10-CM

## 2014-10-25 DIAGNOSIS — T402X5A Adverse effect of other opioids, initial encounter: Secondary | ICD-10-CM

## 2014-10-25 DIAGNOSIS — G959 Disease of spinal cord, unspecified: Secondary | ICD-10-CM | POA: Diagnosis not present

## 2014-10-25 DIAGNOSIS — D649 Anemia, unspecified: Secondary | ICD-10-CM | POA: Diagnosis not present

## 2014-10-25 DIAGNOSIS — K766 Portal hypertension: Secondary | ICD-10-CM

## 2014-10-25 DIAGNOSIS — K567 Ileus, unspecified: Secondary | ICD-10-CM | POA: Diagnosis not present

## 2014-10-25 DIAGNOSIS — R52 Pain, unspecified: Secondary | ICD-10-CM

## 2014-10-25 DIAGNOSIS — K219 Gastro-esophageal reflux disease without esophagitis: Secondary | ICD-10-CM

## 2014-10-25 DIAGNOSIS — K913 Postprocedural intestinal obstruction: Secondary | ICD-10-CM | POA: Diagnosis not present

## 2014-10-25 DIAGNOSIS — Z79899 Other long term (current) drug therapy: Secondary | ICD-10-CM

## 2014-10-25 DIAGNOSIS — M5412 Radiculopathy, cervical region: Secondary | ICD-10-CM | POA: Diagnosis not present

## 2014-10-25 DIAGNOSIS — I10 Essential (primary) hypertension: Secondary | ICD-10-CM

## 2014-10-25 DIAGNOSIS — K5909 Other constipation: Secondary | ICD-10-CM | POA: Diagnosis not present

## 2014-10-25 DIAGNOSIS — R062 Wheezing: Secondary | ICD-10-CM

## 2014-10-25 DIAGNOSIS — Z09 Encounter for follow-up examination after completed treatment for conditions other than malignant neoplasm: Secondary | ICD-10-CM

## 2014-10-25 LAB — GLUCOSE, CAPILLARY
Glucose-Capillary: 137 mg/dL — ABNORMAL HIGH (ref 70–99)
Glucose-Capillary: 203 mg/dL — ABNORMAL HIGH (ref 70–99)
Glucose-Capillary: 192 mg/dL — ABNORMAL HIGH (ref 70–99)
Glucose-Capillary: 190 mg/dL — ABNORMAL HIGH (ref 70–99)

## 2014-10-25 LAB — HEPATITIS C ANTIBODY (REFLEX): HCV AB: NEGATIVE

## 2014-10-25 MED ORDER — FENTANYL 12 MCG/HR TD PT72
12.5000 ug | MEDICATED_PATCH | TRANSDERMAL | Status: DC
  Filled 2014-10-25: qty 1

## 2014-10-25 MED ORDER — FLEET ENEMA 7-19 GM/118ML RE ENEM
1.0000 | ENEMA | Freq: Every day | RECTAL | Status: DC | PRN
  Administered 2014-10-29 – 2014-11-05 (×2): 1 via RECTAL
  Filled 2014-10-25 (×3): qty 1

## 2014-10-25 MED ORDER — ONDANSETRON HCL 4 MG PO TABS: 4.0000 mg | ORAL_TABLET | Freq: Four times a day (QID) | ORAL | Status: DC | PRN

## 2014-10-25 MED ORDER — INSULIN ASPART 100 UNIT/ML ~~LOC~~ SOLN
0.0000 [IU] | Freq: Every day | SUBCUTANEOUS | Status: DC
  Administered 2014-10-26 – 2014-10-28 (×2): 2 [IU] via SUBCUTANEOUS
  Administered 2014-10-29 – 2014-11-07 (×3): 3 [IU] via SUBCUTANEOUS

## 2014-10-25 MED ORDER — LINAGLIPTIN 5 MG PO TABS
5.0000 mg | ORAL_TABLET | Freq: Every day | ORAL | Status: DC
  Administered 2014-10-25 – 2014-11-11 (×18): 5 mg via ORAL
  Filled 2014-10-25 (×19): qty 1

## 2014-10-25 MED ORDER — SODIUM CHLORIDE 0.9 % IV SOLN: 250.0000 mL | INTRAVENOUS | Status: DC | PRN

## 2014-10-25 MED ORDER — BISACODYL 10 MG RE SUPP: 10.0000 mg | Freq: Every day | RECTAL | Status: DC | PRN

## 2014-10-25 MED ORDER — CEFTRIAXONE SODIUM IN DEXTROSE 40 MG/ML IV SOLN
2.0000 g | Freq: Two times a day (BID) | INTRAVENOUS | Status: DC
  Administered 2014-10-25 – 2014-11-11 (×34): 2 g via INTRAVENOUS
  Filled 2014-10-25 (×41): qty 50

## 2014-10-25 MED ORDER — ACETAMINOPHEN 325 MG PO TABS
325.0000 mg | ORAL_TABLET | ORAL | Status: DC | PRN
Start: 2014-10-25 — End: 2014-11-11
  Administered 2014-10-26 – 2014-11-01 (×5): 650 mg via ORAL
  Filled 2014-10-25 (×6): qty 2

## 2014-10-25 MED ORDER — DIPHENHYDRAMINE HCL 12.5 MG/5ML PO ELIX
12.5000 mg | ORAL_SOLUTION | Freq: Four times a day (QID) | ORAL | Status: DC | PRN
  Administered 2014-11-05: 25 mg via ORAL
  Filled 2014-10-25: qty 10

## 2014-10-25 MED ORDER — PANTOPRAZOLE SODIUM 40 MG PO TBEC
40.0000 mg | DELAYED_RELEASE_TABLET | Freq: Every morning | ORAL | Status: DC
Start: 2014-10-26 — End: 2014-11-11
  Administered 2014-10-26 – 2014-11-11 (×17): 40 mg via ORAL
  Filled 2014-10-25 (×19): qty 1

## 2014-10-25 MED ORDER — PROPRANOLOL HCL 20 MG PO TABS
20.0000 mg | ORAL_TABLET | Freq: Every morning | ORAL | Status: DC
  Administered 2014-10-26 – 2014-11-10 (×16): 20 mg via ORAL
  Filled 2014-10-25 (×18): qty 1

## 2014-10-25 MED ORDER — FLEET ENEMA 7-19 GM/118ML RE ENEM
1.0000 | ENEMA | Freq: Once | RECTAL | Status: AC
  Administered 2014-10-25: 1 via RECTAL
  Filled 2014-10-25 (×2): qty 1

## 2014-10-25 MED ORDER — PRO-STAT SUGAR FREE PO LIQD
30.0000 mL | Freq: Two times a day (BID) | ORAL | Status: DC
  Administered 2014-10-25 – 2014-11-06 (×22): 30 mL via ORAL
  Filled 2014-10-25 (×26): qty 30

## 2014-10-25 MED ORDER — ALUM & MAG HYDROXIDE-SIMETH 200-200-20 MG/5ML PO SUSP
30.0000 mL | ORAL | Status: DC | PRN
  Administered 2014-11-08: 30 mL via ORAL
  Filled 2014-10-25: qty 30

## 2014-10-25 MED ORDER — VITAMIN D (ERGOCALCIFEROL) 1.25 MG (50000 UNIT) PO CAPS
50000.0000 [IU] | ORAL_CAPSULE | ORAL | Status: DC
  Administered 2014-10-30 – 2014-11-06 (×2): 50000 [IU] via ORAL
  Filled 2014-10-25 (×2): qty 1

## 2014-10-25 MED ORDER — OXYCODONE HCL 5 MG PO TABS
5.0000 mg | ORAL_TABLET | ORAL | Status: DC | PRN
  Administered 2014-10-25 – 2014-10-26 (×3): 10 mg via ORAL
  Filled 2014-10-25 (×3): qty 2

## 2014-10-25 MED ORDER — ONDANSETRON HCL 4 MG/2ML IJ SOLN: 4.0000 mg | Freq: Four times a day (QID) | INTRAMUSCULAR | Status: DC | PRN

## 2014-10-25 MED ORDER — INSULIN ASPART 100 UNIT/ML ~~LOC~~ SOLN
0.0000 [IU] | Freq: Three times a day (TID) | SUBCUTANEOUS | Status: DC
  Administered 2014-10-26: 1 [IU] via SUBCUTANEOUS
  Administered 2014-10-26: 2 [IU] via SUBCUTANEOUS
  Administered 2014-10-26: 3 [IU] via SUBCUTANEOUS
  Administered 2014-10-27 (×3): 2 [IU] via SUBCUTANEOUS
  Administered 2014-10-28: 3 [IU] via SUBCUTANEOUS
  Administered 2014-10-28: 5 [IU] via SUBCUTANEOUS
  Administered 2014-10-28: 2 [IU] via SUBCUTANEOUS

## 2014-10-25 MED ORDER — METHOCARBAMOL 500 MG PO TABS
500.0000 mg | ORAL_TABLET | Freq: Four times a day (QID) | ORAL | Status: DC | PRN
  Administered 2014-10-25 – 2014-11-10 (×15): 500 mg via ORAL
  Filled 2014-10-25 (×16): qty 1

## 2014-10-25 MED ORDER — GUAIFENESIN-DM 100-10 MG/5ML PO SYRP
5.0000 mL | ORAL_SOLUTION | Freq: Four times a day (QID) | ORAL | Status: DC | PRN
  Administered 2014-11-01: 5 mL via ORAL
  Administered 2014-11-03 – 2014-11-05 (×4): 10 mL via ORAL
  Filled 2014-10-25 (×5): qty 10

## 2014-10-25 MED ORDER — SENNOSIDES-DOCUSATE SODIUM 8.6-50 MG PO TABS
2.0000 | ORAL_TABLET | Freq: Two times a day (BID) | ORAL | Status: DC
  Administered 2014-10-25 – 2014-10-26 (×2): 2 via ORAL
  Filled 2014-10-25 (×4): qty 2

## 2014-10-25 MED ORDER — TRAZODONE HCL 50 MG PO TABS
25.0000 mg | ORAL_TABLET | Freq: Every evening | ORAL | Status: DC | PRN
Start: 2014-10-25 — End: 2014-11-11
  Administered 2014-10-29 – 2014-11-03 (×3): 50 mg via ORAL
  Filled 2014-10-25 (×3): qty 1

## 2014-10-25 NOTE — Progress Notes (Signed)
Patient ID: [name], male   DOB: [birth year], 64 y.o.   MRN: [id number] Patient admitted to 4M07 via bed, escorted by spouse and nursing staff.  Patient and spouse verbalized understanding of rehab process, signed fall safety agreement.  Patient appears to be in no immediate distress at this time.  Will continue to monitor.  Brita Romp, RN

## 2014-10-25 NOTE — Discharge Summary (Signed)
Physician Discharge Summary  [name] TKW:[id number] DOB: 1950/12/23 DOA: 10/20/2014  PCP: [name], MD  Admit date: 10/20/2014 Discharge date: 10/25/2014  Time spent: 35 minutes  Recommendations for Outpatient Follow-up:  IV ceftriaxone 2 grams every 24 hours. Currently q 12 hours and can continue with that while he is in house and in rehab.  At least 8 weeks and repeat MRI at that time in 6-8 weeks.  He can continue with q12 dosing while in rehab and then at discharge from rehab change ceftriaxone dose to daily 2 grams through June 9 -bowel regimen -will need further titration of pain meds  Discharge Diagnoses:  Active Problems:   cervical Epidural abscess   Abscess in epidural space of cervical spine   Encephalopathy acute   Streptococcal bacteremia   NASH (nonalcoholic steatohepatitis)   Other cirrhosis of liver   Diabetes mellitus type 2, uncontrolled   Thrombocytopenia   Group B streptococcal infection   Discharge Condition: improved  Diet recommendation: cardiac/diabetic  Filed Weights   10/21/14 0500 10/22/14 0550 10/23/14 0500  Weight: 88.6 kg (195 lb 5.2 oz) 89.1 kg (196 lb 6.9 oz) 90.9 kg (200 lb 6.4 oz)    History of present illness:  64 yo male with dm2, cirrhosis, apparently c/o neck pain for the past 2 weeks. Apparently had fever, and blood culture by pcp grew out gram positive cocci, Pt was instructed to go to the ED. Pt went to ED at [facility] and there was concern for ? Infection of the spine. ED wanted MRI which was not available there and therefore patient was transferred to [facility]. Pt will be admitted for neck pain and fever.   Hospital Course:  cervical Epidural/paraspinous abscess: Group B Streptococcal: Per IR, nothing safe to aspirate.  ceftriaxone per ID recommendations.   Neck pain -add fentanyl patch -increase PO pain meds   Encephalopathy acute:  -resolved- prob due to infection    Bacteremia: Growing gram-positive cocci in pairs and chains -abx per ID: will need prolonged course of IV ceftriaxone 2 grams every 24 hours. Currently q 12 hours and can continue with that while he is in house and in rehab.  At least 8 weeks and repeat MRI at that time in 6-8 weeks.  -echo: Sclerotic aortic valve without stenosis- no TEE as do not want to manipulate neck -blood cultures NGTD on repeat   NASH (nonalcoholic steatohepatitis)   Other cirrhosis of liver   Diabetes mellitus type 2, uncontrolled: Sliding scale only for now   Thrombocytopenia  Constipation- PRN miralax, suppository, colase/sennakot. Enema- good bowel sounds   Procedures:    Consultations:  IR  ID  CIR  Discharge Exam: Filed Vitals:   10/25/14 0551  BP: 137/70  Pulse: 62  Temp: 97.8 F (36.6 C)  Resp: 18    General: awake, appear in no distress but states pain is 9/10 Cardiovascular: rrr Respiratory: clear +BS, distended  Discharge Instructions    Current Discharge Medication List    CONTINUE these medications which have NOT CHANGED   Details  !! canagliflozin (INVOKANA) 100 MG TABS tablet Take 100 mg by mouth daily.    cetirizine (ZYRTEC) 10 MG tablet Take 10 mg by mouth daily as needed for allergies.     doxycycline (VIBRA-TABS) 100 MG tablet Take 100 mg by mouth 2 (two) times daily.    Dulaglutide 0.75 MG/0.5ML SOPN Inject 0.75 mg into the skin once a week.    gabapentin (NEURONTIN) 100 MG  capsule Take 100 mg by mouth daily.     JANUVIA 100 MG tablet Take 100 mg by mouth daily.    linagliptin (TRADJENTA) 5 MG TABS tablet Take 5 mg by mouth every morning.     LIVER EXTRACT PO Take by mouth daily.    Magnesium 250 MG TABS Take 1 tablet by mouth every morning.     milk thistle 175 MG tablet Take 175 mg by mouth every morning.     pantoprazole (PROTONIX) 40 MG tablet Take 1 tablet (40 mg total) by mouth daily. Qty: 30 tablet, Refills: 6    Potassium 99 MG  TABS Take 1 tablet by mouth every morning.     propranolol (INDERAL) 20 MG tablet TAKE 1 TABLET (20 MG TOTAL) BY MOUTH DAILY. Qty: 30 tablet, Refills: 9    !! INVOKANA 300 MG TABS tablet Take 300 mg by mouth daily.    Vitamin D, Ergocalciferol, (DRISDOL) 50000 UNITS CAPS Take 50,000 Units by mouth every 7 (seven) days.      !! - Potential duplicate medications found. Please discuss with provider.     No Known Allergies    The results of significant diagnostics from this hospitalization (including imaging, microbiology, ancillary and laboratory) are listed below for reference.    Significant Diagnostic Studies: Dg Chest 2 View  10/21/2014   CLINICAL DATA:  Fever  EXAM: CHEST  2 VIEW  COMPARISON:  10/20/2014  FINDINGS: Very low lung volumes with streaky opacities over the diaphragm in the lateral projection. No edema or effusion. No overt pneumonia. Apparent cardiomegaly, although size is accentuated by technique.  IMPRESSION: Marked hypoinflation with bibasilar atelectasis.   Electronically Signed   By: Monte Fantasia M.D.   On: 10/21/2014 05:04   Ct Head W Wo Contrast  10/21/2014   CLINICAL DATA:  64 year old male with fever and acute encephalopathy. Initial encounter. Current history of cirrhosis. Bacteremia, acute cervical spine infection including epidural and paraspinal soft tissue involvement.  EXAM: CT HEAD WITHOUT AND WITH CONTRAST  TECHNIQUE: Contiguous axial images were obtained from the base of the skull through the vertex without and with intravenous contrast  CONTRAST:  79mL OMNIPAQUE IOHEXOL 300 MG/ML  SOLN  COMPARISON:  Cervical spine MRI 0052 hours today. [facility] Head CT without contrast 09/19/2012.  FINDINGS: Chronic left orbital floor fracture. No acute osseous abnormality identified. Visualized paranasal sinuses and mastoids are clear. No acute scalp soft tissue findings.  There is a metallic surgical clip or foreign body at the medial right orbit re - identified  and unchanged since 2014 (series 3, image 18). Otherwise negative visualized orbits soft tissues.  Cerebral volume remains normal. No ventriculomegaly. No midline shift, mass effect, or evidence of intracranial mass lesion. No acute intracranial hemorrhage identified. No evidence of cortically based acute infarction identified. Stable and normal gray-white matter differentiation. No abnormal enhancement identified. Major intracranial vascular structures are enhancing.  IMPRESSION: 1.  Normal CT appearance of the brain. 2. Chronic surgical clip or metallic foreign body at the medial right orbit near the insertion of the medial rectus muscle, unchanged since 2014 head CT.   Electronically Signed   By: Genevie Ann M.D.   On: 10/21/2014 10:04   Mr Cervical Spine W Wo Contrast  10/21/2014   CLINICAL DATA:  Initial evaluation for 2 week history of neck pain 8. Fever. Recent bacteremia. Concern for possible infection. History of diabetes, cirrhosis.  EXAM: MRI CERVICAL SPINE WITHOUT AND WITH CONTRAST  TECHNIQUE: Multiplanar and multiecho  pulse sequences of the cervical spine, to include the craniocervical junction and cervicothoracic junction, were obtained according to standard protocol without and with intravenous contrast.  CONTRAST:  56mL MULTIHANCE GADOBENATE DIMEGLUMINE 529 MG/ML IV SOLN  COMPARISON:  Prior radiograph from 10/19/2014  FINDINGS: Visualized portions of the brain itself are normal. There is abnormal dural enhancement extending cephalad at the level of the foramen magnum. Craniocervical junction within normal limits.  Trace anterior listhesis of C6 on C7. Otherwise, vertebral bodies are normally aligned with preservation of the normal cervical lordosis. Vertebral body heights are preserved. Signal intensity within the vertebral body bone marrow is normal. No evidence for osteomyelitis discitis. No prevertebral edema.  Signal intensity within the cervical spinal cord is within normal limits, although  evaluation is somewhat limited due to motion artifact on this exam.  There is a in T2 hyperintense rim enhancing collection predominant located within the left posterolateral epidural space of the cervical spinal cord (series 17, image 13). This collection essentially begins at day foramen magnum/level of C1, and extends inferiorly to approximately the level of C6. Collection is most evident within the left posterolateral epidural space at the level of C5 where it measures 6 x 9 mm (series 17, image 16). There is abnormal enhancement with phlegmonous change throughout the left lateral and posterior epidural space extending from the level of C1 inferiorly to approximately C6. Abscess wraps around into the ventral epidural space at the level of C2 (series 17, image 3). Additional abscess appears to extend through the left neural foramen at C5-6 (Series 17, image 18). There is associated moderate to severe diffuse canal stenosis with the cervical spinal cord somewhat flattened and displaced to the right. Stenosis is most severe at the C4-5 level. Again, there are no definite cord signal changes.  There is abnormal enhancement throughout the posterior paraspinous musculature, extending from the base of the occiput inferiorly to approximately the level of C7. There are multi focal irregular abscesses within the paraspinous musculature (series 15, image 14). These are predominantly within the left paraspinous musculature. For reference purposes, the most prominent collection measures approximately 8 x 16 x 18 mm at the level of C6-7 (series 15, image 13). Additional phlegmon/abscess located more superiorly within the left paraspinous musculature just beneath the occiput.  C2-3: Bilateral uncovertebral spurring with mild bilateral facet hypertrophy. Mild stenosis related to the epidural abscess.  C3-4: Bilateral uncovertebral hypertrophy with facet arthrosis. Resultant mild left foraminal narrowing. Mild canal stenosis  related to the epidural abscess.  C4-5: Mild diffuse degenerative disc osteophyte with bilateral uncovertebral spurring and facet arthrosis. Superimposed right paracentral/foraminal disc osteophyte complex. Moderate canal stenosis related to degenerative changes and epidural abscess. Moderate right with more mild left foraminal narrowing.  C5-6: Mild uncovertebral hypertrophy with facet arthrosis. Moderate canal stenosis related epidural abscess. Again, abscess appears to extend through the left C5-6 neural foramen.  C6-7: Trace anterolisthesis with associated posterior disc bulge. Mild canal stenosis. Mild bilateral facet arthrosis present. No significant foraminal narrowing.  C7-T1: Central disc protrusion with mild bilateral uncovertebral hypertrophy. Resultant mild canal stenosis. Foramina are grossly patent.  IMPRESSION: 1. Epidural abscess with enhancing phlegmonous change extending from the level of C1 inferiorly to approximately the level of C6-7 as detailed above. Abscess is predominantly located within the left lateral and left posterolateral epidural space, but does extend anteriorly into the ventral epidural space at the level of C2. There is extension through the left C5-6 neural foramen as well. There is secondary moderate  to severe diffuse canal stenosis with flattening and mild rightward displacement of the cervical spinal cord, most prevalent at the C4-5 level. No cord signal changes. 2. Extensive abnormal enhancement with multi focal abscesses involving the posterior paraspinous musculature, predominantly on the left. 3. Abnormal dural enhancement extending superiorly through the foramen magnum. 4. No evidence for osteomyelitis discitis. 5. Multilevel degenerative disc disease as above. Results were called by telephone at the time of interpretation on 10/21/2014 at 2:08 am to Dr. Jani Gravel , who verbally acknowledged these results.   Electronically Signed   By: Jeannine Boga M.D.   On:  10/21/2014 02:14    Microbiology: Recent Results (from the past 240 hour(s))  Culture, blood (routine x 2)     Status: None (Preliminary result)   Collection Time: 10/21/14  4:46 AM  Result Value Ref Range Status   Specimen Description BLOOD RIGHT ASSIST CONTROL  Final   Special Requests BOTTLES DRAWN AEROBIC AND ANAEROBIC 10 CC  Final   Culture   Final           BLOOD CULTURE RECEIVED NO GROWTH TO DATE CULTURE WILL BE HELD FOR 5 DAYS BEFORE ISSUING A FINAL NEGATIVE REPORT Performed at [facility]    Report Status PENDING  Incomplete  Culture, blood (routine x 2)     Status: None (Preliminary result)   Collection Time: 10/21/14  4:53 AM  Result Value Ref Range Status   Specimen Description BLOOD RIGHT HAND  Final   Special Requests BOTTLES DRAWN AEROBIC ONLY 7 CC  Final   Culture   Final           BLOOD CULTURE RECEIVED NO GROWTH TO DATE CULTURE WILL BE HELD FOR 5 DAYS BEFORE ISSUING A FINAL NEGATIVE REPORT Performed at [facility]    Report Status PENDING  Incomplete     Labs: Basic Metabolic Panel:  Recent Labs Lab 10/21/14 0446 10/22/14 0625 10/23/14 0713  NA 131* 133* 132*  K 4.6 3.8 3.7  CL 96 100 98  CO2 20 21 22   GLUCOSE 156* 145* 125*  BUN 23 21 17   CREATININE 0.89 0.85 0.70  CALCIUM 8.6 8.2* 8.1*   Liver Function Tests:  Recent Labs Lab 10/21/14 0446 10/22/14 0625  AST 30 26  ALT 19 15  ALKPHOS 99 84  BILITOT 3.5* 2.4*  PROT 7.2 6.5  ALBUMIN 2.1* 1.8*   No results for input(s): LIPASE, AMYLASE in the last 168 hours. No results for input(s): AMMONIA in the last 168 hours. CBC:  Recent Labs Lab 10/21/14 0446 10/22/14 0625 10/23/14 0713  WBC 16.7* 12.4* 12.4*  NEUTROABS 14.7* 9.5*  --   HGB 13.2 11.7* 11.7*  HCT 39.0 34.6* 34.3*  MCV 89.2 89.2 88.4  PLT 24* 25* 26*   Cardiac Enzymes: No results for input(s): CKTOTAL, CKMB, CKMBINDEX, TROPONINI in the last 168 hours. BNP: BNP (last 3 results) No results for input(s):  BNP in the last 8760 hours.  ProBNP (last 3 results) No results for input(s): PROBNP in the last 8760 hours.  CBG:  Recent Labs Lab 10/24/14 0830 10/24/14 1151 10/24/14 1633 10/24/14 2113 10/25/14 0753  GLUCAP 138* 153* 154* 196* 137*       Signed:  Damien Batty  [facility] 10/25/2014, 8:55 AM

## 2014-10-25 NOTE — Progress Notes (Signed)
Rehab admissions - I have approval for inpatient rehab admission and I have a bed open today.  Will admit to acute inpatient rehab today.  Call me for questions.  #[phone]

## 2014-10-25 NOTE — Consult Note (Signed)
Physical Medicine and Rehabilitation Admission H&P    CC: Cervical epidural abscess with tetraplegia   HPI:   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 He had blood cultures drawn by MD which was positive for gram-positive cocci.  He was instructed to go to [facility] ED on 10/21/14 due to concerns of spine infection and was transferred to [facility] for evaluation.  MRI cervical spine revealed epidural abscess with enhancing phlegmonous change from C1-C 6-7 with moderate to severe diffuse canal stenosis with flattening and displacement of cord, multifocal abscesses involving posterior paraspinous musculature predominantly on the left and abnormal dural enhancement extending superiorly through the foramen magnum. CT head negative for acute changes. Patient somnolent and encephalopathic with left sided neglect and quadriparesis at admission. Dr. Saintclair Halsted consulted for input and recommended management with IV antibiotics as extremely  high surgical risk due to multiple comorbidities as well as coagulopathy.    Dr. Tommy Medal consulted and recommended adding IV ceftriaxone to IV Vancomycin for CNS penetration. CRP- 27.4 and sed rate 62. 2D echo with EF 65-70%, mild to moderate left atrial enlargement and mild aortic sclerosis without stenosis. TEE not done due to inability to manipulate neck. Repeat cultures positive for Group B streptococci and patient to continue on IV ceftriaxone 2 grams bid while hospitalized and change to 2 grams daily at discharge thorough June 9th for 8 weeks antibiotic therapy.  Will need repeat MRI spine at 6-8 weeks with follow up with ID in 4 weeks. Mentation improving with decrease in left inattention and improvement in LLE weakness.  Therapy initiated and patient limited by unsteady gait, fatigue and  diffuse weakness with poor coordination. CIR was recommended by MD and rehab team and patient admitted today.    Review of Systems  HENT: Negative for hearing loss.   Eyes: Negative for blurred vision and double vision.  Respiratory: Negative for cough and shortness of breath.   Cardiovascular: Negative for chest pain and palpitations.  Gastrointestinal: Positive for vomiting, abdominal pain (occasionally) and constipation (No BM in 5 days). Negative for heartburn and nausea.  Musculoskeletal: Positive for myalgias and back pain.  Neurological: Positive for dizziness, sensory change, weakness and headaches.  Psychiatric/Behavioral: Positive for depression. The patient has insomnia (due to pain).       Past Medical History  Diagnosis Date  . Reflux esophagitis   . Portal hypertension   . Stricture and stenosis of esophagus   . Diverticulosis of colon (without mention of hemorrhage)   . Internal hemorrhoids without mention of complication   . Benign neoplasm of colon   . Duodenitis without mention of hemorrhage   . 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 Personal history of diseases of blood and blood-forming organs   . Cirrhosis of liver without mention of alcohol   . Unspecified essential hypertension   . Obesity, unspecified   . Type II or unspecified type diabetes mellitus without mention of complication, not stated as uncontrolled   . GERD (gastroesophageal reflux disease)   . Hemorrhoids   . Colon polyps   . Pancytopenia   . Calculus of kidney yrs ago    Past Surgical History  Procedure Laterality Date  . Kidney stone surgery  yrs ago  . Esophagogastroduodenoscopy  08/01/2011  Procedure: ESOPHAGOGASTRODUODENOSCOPY (EGD);  Surgeon: Scarlette Shorts, MD;  Location: [facility];  Service: Endoscopy;  Laterality: N/A;  . Esophagogastroduodenoscopy N/A 10/27/2012    Procedure: ESOPHAGOGASTRODUODENOSCOPY (EGD);  Surgeon: Inda Castle, MD;  Location: [facility];   Service: Endoscopy;  Laterality: N/A;  . Esophagogastroduodenoscopy N/A 11/22/2012    Procedure: ESOPHAGOGASTRODUODENOSCOPY (EGD);  Surgeon: Irene Shipper, MD;  Location: [facility];  Service: Endoscopy;  Laterality: N/A;  . Colonoscopy    . Upper gastrointestinal endoscopy    . Esophagogastroduodenoscopy N/A 11/29/2013    Procedure: ESOPHAGOGASTRODUODENOSCOPY (EGD);  Surgeon: Irene Shipper, MD;  Location: [facility];  Service: Endoscopy;  Laterality: N/A;  . Esophageal banding Bilateral 11/29/2013    Procedure: ESOPHAGEAL BANDING;  Surgeon: Irene Shipper, MD;  Location: [facility];  Service: Endoscopy;  Laterality: Bilateral;  . Esophagogastroduodenoscopy N/A 06/13/2014    Procedure: ESOPHAGOGASTRODUODENOSCOPY (EGD);  Surgeon: Irene Shipper, MD;  Location: [facility];  Service: Endoscopy;  Laterality: N/A;  . Esophageal banding N/A 06/13/2014    Procedure: ESOPHAGEAL BANDING;  Surgeon: Irene Shipper, MD;  Location: [facility];  Service: Endoscopy;  Laterality: N/A;    Family History  Problem Relation Age of Onset  . Colon cancer Neg Hx   . Anesthesia problems Neg Hx   . Hypotension Neg Hx   . Malignant hyperthermia Neg Hx   . Pseudochol deficiency Neg Hx   . Diabetes Father   . Heart disease Father   . Aneurysm Mother   . Heart disease Other     neice  . Diabetes      niece    Social History:  Married. Independent and works as a Administrator for [employer].  Used to be a Engineer, building services.  He reports that he has never smoked. He has never used smokeless tobacco. He reports that he does not drink alcohol or use illicit drugs.    Allergies: No Known Allergies    Medications Prior to Admission  Medication Sig Dispense Refill  . canagliflozin (INVOKANA) 100 MG TABS tablet Take 100 mg by mouth daily.    . cetirizine (ZYRTEC) 10 MG tablet Take 10 mg by mouth daily as needed for allergies.     Marland Kitchen doxycycline (VIBRA-TABS) 100 MG tablet Take 100 mg by mouth 2 (two) times daily.      . Dulaglutide 0.75 MG/0.5ML SOPN Inject 0.75 mg into the skin once a week.    . gabapentin (NEURONTIN) 100 MG capsule Take 100 mg by mouth daily.     Marland Kitchen JANUVIA 100 MG tablet Take 100 mg by mouth daily.    Marland Kitchen linagliptin (TRADJENTA) 5 MG TABS tablet Take 5 mg by mouth every morning.     Marland Kitchen LIVER EXTRACT PO Take by mouth daily.    . Magnesium 250 MG TABS Take 1 tablet by mouth every morning.     . milk thistle 175 MG tablet Take 175 mg by mouth every morning.     . pantoprazole (PROTONIX) 40 MG tablet Take 1 tablet (40 mg total) by mouth daily. (Patient taking differently: Take 40 mg by mouth every morning. ) 30 tablet 6  . Potassium 99 MG TABS Take 1 tablet by mouth every morning.     . propranolol (INDERAL) 20 MG tablet TAKE 1 TABLET (20 MG TOTAL) BY MOUTH DAILY. 30 tablet 9  . INVOKANA 300 MG TABS tablet Take 300 mg by mouth daily.    . Vitamin D, Ergocalciferol, (DRISDOL) 50000 UNITS CAPS Take 50,000  Units by mouth every 7 (seven) days.       Home: Home Living Family/patient expects to be discharged to:: Private residence Living Arrangements: Spouse/significant other Available Help at Discharge: Family Type of Home: House Home Access: Stairs to enter Technical brewer of Steps: 2 Entrance Stairs-Rails: None Home Layout: Multi-level Alternate Level Stairs-Number of Steps: flights Alternate Level Stairs-Rails: Right, Left Home Equipment: None Additional Comments: Works as a Administrator for [employer]: Prior Function Level of Independence: Independent  Functional Status:  Mobility: Bed Mobility Overal bed mobility: Needs Assistance Bed Mobility: Rolling, Sidelying to Sit Rolling: Min assist Sidelying to sit: Sheldon bed mobility comments: cues for direction Transfers Overall transfer level: Needs assistance Transfers: Sit to/from Stand Sit to Stand: Min guard General transfer comment: guard for safety, some stabilization against the  bed Ambulation/Gait Ambulation/Gait assistance: Min assist Ambulation Distance (Feet): 110 Feet Assistive device: Rolling walker (2 wheeled), None Gait Pattern/deviations: Step-through pattern, Trunk flexed Gait velocity: slower General Gait Details: mildly unsteady gait L LE worse than R, worsening with fatigue, some foot slap and visible incoordination L LE    ADL: ADL Overall ADL's : Needs assistance/impaired Eating/Feeding: Set up, Sitting Grooming: Minimal assistance, Sitting Upper Body Bathing: Set up, Sitting Lower Body Bathing: Moderate assistance (with min A sit<>stand) Upper Body Dressing : Moderate assistance, Sitting Lower Body Dressing: Moderate assistance (with min A sit<>stand) Toilet Transfer: Minimal assistance, Ambulation, RW, Regular Toilet, Grab bars Toileting- Clothing Manipulation and Hygiene: Minimal assistance (with min A sit<>stand)  Cognition: Cognition Overall Cognitive Status: Within Functional Limits for tasks assessed Orientation Level: Oriented to person, Oriented to situation, Disoriented to time, Oriented to place Cognition Arousal/Alertness: Awake/alert Behavior During Therapy: WFL for tasks assessed/performed Overall Cognitive Status: Within Functional Limits for tasks assessed    Blood pressure 137/70, pulse 62, temperature 97.8 F (36.6 C), temperature source Oral, resp. rate 18, height 5\' 11"  (1.803 m), weight 90.9 kg (200 lb 6.4 oz), SpO2 93 %. Physical Exam  Nursing note and vitals reviewed. Constitutional: He is oriented to person, place, and time. He appears well-developed and well-nourished.  HENT:  Head: Normocephalic and atraumatic.  Eyes: Conjunctivae are normal. Pupils are equal, round, and reactive to light.  Neck: Muscular tenderness present. Decreased range of motion present.  Unable to turn head to left due to significant pain.    Cardiovascular: Normal rate and regular rhythm.   Respiratory: Effort normal and breath  sounds normal. No respiratory distress. He has no wheezes.  GI: He exhibits distension. Bowel sounds are decreased. There is tenderness.  Musculoskeletal: He exhibits edema (1+ edema pedally).  Neck pain with ROM bilateral hips and LUE.    Neurological: He is alert and oriented to person, place, and time. Coordination abnormal.  Left inattention due to neck and shoulder pain limiting ROM.Marland Kitchen Anxious appearing but speech clear and is able to follow basic commands without difficulty.  RUE 4+/5. L- deltoid 1+/5,1+ biceps, 3+ to 4- triceps,  intrinsics 4+/5. BLE 3+/5 with ataxia. Some upper ext giveaway strength due to pain radiating to neck. Did not appreciate any sensory loss. DTR's 1+  Skin: Skin is warm and dry.  Healing abrasions bilateral shins  Psychiatric: His speech is normal. His mood appears anxious. His affect is blunt. He is slowed.    Results for orders placed or performed during the hospital encounter of 10/20/14 (from the past 48 hour(s))  Glucose, capillary     Status: Abnormal  Collection Time: 10/23/14 11:43 AM  Result Value Ref Range   Glucose-Capillary 184 (H) 70 - 99 mg/dL  Urinalysis, Routine w reflex microscopic     Status: Abnormal   Collection Time: 10/23/14  1:25 PM  Result Value Ref Range   Color, Urine AMBER (A) YELLOW    Comment: BIOCHEMICALS MAY BE AFFECTED BY COLOR   APPearance CLEAR CLEAR   Specific Gravity, Urine 1.035 (H) 1.005 - 1.030   pH 6.0 5.0 - 8.0   Glucose, UA >1000 (A) NEGATIVE mg/dL   Hgb urine dipstick NEGATIVE NEGATIVE   Bilirubin Urine NEGATIVE NEGATIVE   Ketones, ur 40 (A) NEGATIVE mg/dL   Protein, ur NEGATIVE NEGATIVE mg/dL   Urobilinogen, UA 1.0 0.0 - 1.0 mg/dL   Nitrite NEGATIVE NEGATIVE   Leukocytes, UA NEGATIVE NEGATIVE  Urine microscopic-add on     Status: None   Collection Time: 10/23/14  1:25 PM  Result Value Ref Range   Squamous Epithelial / LPF RARE RARE   WBC, UA 0-2 <3 WBC/hpf   RBC / HPF 3-6 <3 RBC/hpf   Bacteria, UA RARE  RARE  Glucose, capillary     Status: Abnormal   Collection Time: 10/23/14  4:43 PM  Result Value Ref Range   Glucose-Capillary 178 (H) 70 - 99 mg/dL  Glucose, capillary     Status: Abnormal   Collection Time: 10/23/14  9:51 PM  Result Value Ref Range   Glucose-Capillary 185 (H) 70 - 99 mg/dL   Comment 1 Notify RN    Comment 2 Document in Chart   Glucose, capillary     Status: Abnormal   Collection Time: 10/24/14  8:30 AM  Result Value Ref Range   Glucose-Capillary 138 (H) 70 - 99 mg/dL  Glucose, capillary     Status: Abnormal   Collection Time: 10/24/14 11:51 AM  Result Value Ref Range   Glucose-Capillary 153 (H) 70 - 99 mg/dL  Glucose, capillary     Status: Abnormal   Collection Time: 10/24/14  4:33 PM  Result Value Ref Range   Glucose-Capillary 154 (H) 70 - 99 mg/dL  Glucose, capillary     Status: Abnormal   Collection Time: 10/24/14  9:13 PM  Result Value Ref Range   Glucose-Capillary 196 (H) 70 - 99 mg/dL  Glucose, capillary     Status: Abnormal   Collection Time: 10/25/14  7:53 AM  Result Value Ref Range   Glucose-Capillary 137 (H) 70 - 99 mg/dL   No results found.     Medical Problem List and Plan: 1. Functional deficits secondary to Cervical epidural abscess 2.  DVT Prophylaxis/Anticoagulation: Mechanical:  Antiembolism stockings, knee (TED hose) Bilateral lower extremities Sequential compression devices, below knee Bilateral lower extremities 3. Pain Management:  Duragesic patch started yesterday. Is currently on IV morphine as well as hydrocodone prn for pain management. Will d/c latter and start on prn oxycodone--discussed regimen with patient and wife. Titrate meds as needed.  4. Mood:  Team to provide ego support. LCSW to follow for evaluation and support.  5. Neuropsych: This patient is capable of making decisions on his own behalf. 6. Skin/Wound Care: Routine pressure relief measures.  Maintain adequate nutritional support.  7. Fluids/Electrolytes/Nutrition:  Monitor I/O.  Check lytes in am.  8. 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 Encephalopathy resolving but will check ammonia levels. 9. Thrombocytopenia: Stable at 26 on most recent check. Check CBC in am. Monitor for any signs of bleeding.  10.  DM type 2: Hgb A1c- 8.7.   Monitor BS with ac/hs checks and will use SSI for elevated BS. BS are slowly trending upwards with improvement in po intake. Will resume Tradjenta as was on Invokana and Tradjenta at home.  Titrate medications as indicated.  11. Reactive leucocytosis:  Slowly resolving with treatment of infection.  12.  Constipation: Was started on senna and miralax yesterday without results. KUB to rule out ileus v/s ascites as with distension and significant discomfort. Enema past admission.  13. Insomnia: Work on pain management as this is major factor affecting sleep.     Post Admission Physician Evaluation: 1. Functional deficits secondary  to cervical epidural abscess with myelopathy and left C5 radiculopathy. 2. Patient is admitted to receive collaborative, interdisciplinary care between the physiatrist, rehab nursing staff, and therapy team. 3. Patient's level of medical complexity and substantial therapy needs in context of that medical necessity cannot be provided at a lesser intensity of care such as a SNF. 4. Patient has experienced substantial functional loss from his/her baseline which was documented above under the "Functional History" and "Functional Status" headings.  Judging by the patient's diagnosis, physical exam, and functional history, the patient has potential for functional progress which will result in measurable gains while on inpatient rehab.  These gains will be of substantial and practical use upon discharge  in facilitating mobility and self-care at the household level. 5. Physiatrist will provide 24 hour management of medical needs as well as oversight of the therapy plan/treatment and  provide guidance as appropriate regarding the interaction of the two. 6. 24 hour rehab nursing will assist with bladder management, bowel management, safety, skin/wound care, disease management, medication administration, pain management and patient education  and help integrate therapy concepts, techniques,education, etc. 7. PT will assess and treat for/with: Lower extremity strength, range of motion, stamina, balance, functional mobility, safety, adaptive techniques and equipment, pain mgt, orthotics, NMR, community reintegration.   Goals are: mod I. 8. OT will assess and treat for/with: ADL's, functional mobility, safety, upper extremity strength, adaptive techniques and equipment, NMR, orthotics, ego support, pain control, pt education .   Goals are: mod I to set up. Therapy may proceed with showering this patient. 9. SLP will assess and treat for/with: cognition, communication.  Goals are: mod I. 10. Case Management and Social Worker will assess and treat for psychological issues and discharge planning. 11. Team conference will be held weekly to assess progress toward goals and to determine barriers to discharge. 12. Patient will receive at least 3 hours of therapy per day at least 5 days per week. 13. ELOS: 9-13 days       14. Prognosis:  excellent     Meredith Staggers, MD, [facility] 10/25/2014   10/25/2014

## 2014-10-25 NOTE — H&P (Signed)
Expand All Collapse All      Physical Medicine and Rehabilitation Admission H&P   CC: Cervical epidural abscess with tetraplegia   HPI: 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 He had blood cultures drawn by MD which was positive for gram-positive cocci. He was instructed to go to [facility] ED on 10/21/14 due to concerns of spine infection and was transferred to [facility] for evaluation. MRI cervical spine revealed epidural abscess with enhancing phlegmonous change from C1-C 6-7 with moderate to severe diffuse canal stenosis with flattening and displacement of cord, multifocal abscesses involving posterior paraspinous musculature predominantly on the left and abnormal dural enhancement extending superiorly through the foramen magnum. CT head negative for acute changes. Patient somnolent and encephalopathic with left sided neglect and quadriparesis at admission. Dr. Saintclair Halsted consulted for input and recommended management with IV antibiotics as extremely high surgical risk due to multiple comorbidities as well as coagulopathy.   Dr. Tommy Medal consulted and recommended adding IV ceftriaxone to IV Vancomycin for CNS penetration. CRP- 27.4 and sed rate 62. 2D echo with EF 65-70%, mild to moderate left atrial enlargement and mild aortic sclerosis without stenosis. TEE not done due to inability to manipulate neck. Repeat cultures positive for Group B streptococci and patient to continue on IV ceftriaxone 2 grams bid while hospitalized and change to 2 grams daily at discharge thorough June 9th for 8 weeks antibiotic therapy. Will need repeat MRI spine at 6-8 weeks with follow up with ID in 4 weeks. Mentation improving with decrease in left inattention and improvement in LLE weakness. Therapy initiated and patient limited by  unsteady gait, fatigue and diffuse weakness with poor coordination. CIR was recommended by MD and rehab team and patient admitted today.    Review of Systems  HENT: Negative for hearing loss.  Eyes: Negative for blurred vision and double vision.  Respiratory: Negative for cough and shortness of breath.  Cardiovascular: Negative for chest pain and palpitations.  Gastrointestinal: Positive for vomiting, abdominal pain (occasionally) and constipation (No BM in 5 days). Negative for heartburn and nausea.  Musculoskeletal: Positive for myalgias and back pain.  Neurological: Positive for dizziness, sensory change, weakness and headaches.  Psychiatric/Behavioral: Positive for depression. The patient has insomnia (due to pain).      Past Medical History  Diagnosis Date  . Reflux esophagitis   . Portal hypertension   . Stricture and stenosis of esophagus   . Diverticulosis of colon (without mention of hemorrhage)   . Internal hemorrhoids without mention of complication   . Benign neoplasm of colon   . Duodenitis without mention of hemorrhage   . 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 Personal history of diseases of blood and blood-forming organs   . Cirrhosis of liver without mention of alcohol   . Unspecified essential hypertension   . Obesity, unspecified   . Type II or unspecified type diabetes mellitus without mention of complication, not stated as uncontrolled   . GERD (gastroesophageal reflux disease)   . Hemorrhoids   . Colon polyps   . Pancytopenia   . Calculus of kidney yrs ago    Past Surgical History  Procedure Laterality Date  . Kidney stone surgery  yrs ago  . Esophagogastroduodenoscopy  08/01/2011    Procedure: ESOPHAGOGASTRODUODENOSCOPY (EGD); Surgeon: Scarlette Shorts,  MD; Location: [facility] ENDOSCOPY; Service: Endoscopy; Laterality: N/A;  . Esophagogastroduodenoscopy N/A 10/27/2012      Procedure: ESOPHAGOGASTRODUODENOSCOPY (EGD); Surgeon: Inda Castle, MD; Location: [facility]; Service: Endoscopy; Laterality: N/A;  . Esophagogastroduodenoscopy N/A 11/22/2012    Procedure: ESOPHAGOGASTRODUODENOSCOPY (EGD); Surgeon: Irene Shipper, MD; Location: [facility]; Service: Endoscopy; Laterality: N/A;  . Colonoscopy    . Upper gastrointestinal endoscopy    . Esophagogastroduodenoscopy N/A 11/29/2013    Procedure: ESOPHAGOGASTRODUODENOSCOPY (EGD); Surgeon: Irene Shipper, MD; Location: [facility]; Service: Endoscopy; Laterality: N/A;  . Esophageal banding Bilateral 11/29/2013    Procedure: ESOPHAGEAL BANDING; Surgeon: Irene Shipper, MD; Location: [facility]COPY; Service: Endoscopy; Laterality: Bilateral;  . Esophagogastroduodenoscopy N/A 06/13/2014    Procedure: ESOPHAGOGASTRODUODENOSCOPY (EGD); Surgeon: Irene Shipper, MD; Location: [facility]; Service: Endoscopy; Laterality: N/A;  . Esophageal banding N/A 06/13/2014    Procedure: ESOPHAGEAL BANDING; Surgeon: Irene Shipper, MD; Location: [facility]; Service: Endoscopy; Laterality: N/A;    Family History  Problem Relation Age of Onset  . Colon cancer Neg Hx   . Anesthesia problems Neg Hx   . Hypotension Neg Hx   . Malignant hyperthermia Neg Hx   . Pseudochol deficiency Neg Hx   . Diabetes Father   . Heart disease Father   . Aneurysm Mother   . Heart disease Other     neice  . Diabetes      niece    Social History: Married. 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 He reports that he has never smoked. He has never used smokeless tobacco. He reports that he does not drink alcohol or use illicit drugs.    Allergies: No Known Allergies    Medications Prior to Admission  Medication Sig Dispense Refill  . canagliflozin (INVOKANA) 100 MG TABS  tablet Take 100 mg by mouth daily.    . cetirizine (ZYRTEC) 10 MG tablet Take 10 mg by mouth daily as needed for allergies.     Marland Kitchen doxycycline (VIBRA-TABS) 100 MG tablet Take 100 mg by mouth 2 (two) times daily.    . Dulaglutide 0.75 MG/0.5ML SOPN Inject 0.75 mg into the skin once a week.    . gabapentin (NEURONTIN) 100 MG capsule Take 100 mg by mouth daily.     Marland Kitchen JANUVIA 100 MG tablet Take 100 mg by mouth daily.    Marland Kitchen linagliptin (TRADJENTA) 5 MG TABS tablet Take 5 mg by mouth every morning.     Marland Kitchen LIVER EXTRACT PO Take by mouth daily.    . Magnesium 250 MG TABS Take 1 tablet by mouth every morning.     . milk thistle 175 MG tablet Take 175 mg by mouth every morning.     . pantoprazole (PROTONIX) 40 MG tablet Take 1 tablet (40 mg total) by mouth daily. (Patient taking differently: Take 40 mg by mouth every morning. ) 30 tablet 6  . Potassium 99 MG TABS Take 1 tablet by mouth every morning.     . propranolol (INDERAL) 20 MG tablet TAKE 1 TABLET (20 MG TOTAL) BY MOUTH DAILY. 30 tablet 9  . INVOKANA 300 MG TABS tablet Take 300 mg by mouth daily.    . Vitamin D, Ergocalciferol, (DRISDOL) 50000 UNITS CAPS Take 50,000 Units by mouth every 7 (seven) days.       Home: Home Living Family/patient expects to be discharged to:: Private residence Living Arrangements: Spouse/significant other Available Help at Discharge: Family Type of Home: House  Home Access: Stairs to enter Entrance Stairs-Number of Steps: 2 Entrance Stairs-Rails: None Home Layout: Multi-level Alternate Level Stairs-Number of Steps: flights Alternate Level Stairs-Rails: Right, Left Home Equipment: None Additional Comments: Works as a Administrator for [employer]  Functional History: Prior Function Level of Independence: Independent  Functional Status:  Mobility: Bed Mobility Overal bed mobility: Needs Assistance Bed Mobility: Rolling, Sidelying  to Sit Rolling: Min assist Sidelying to sit: Min assist General bed mobility comments: cues for direction Transfers Overall transfer level: Needs assistance Transfers: Sit to/from Stand Sit to Stand: Min guard General transfer comment: guard for safety, some stabilization against the bed Ambulation/Gait Ambulation/Gait assistance: Min assist Ambulation Distance (Feet): 110 Feet Assistive device: Rolling walker (2 wheeled), None Gait Pattern/deviations: Step-through pattern, Trunk flexed Gait velocity: slower General Gait Details: mildly unsteady gait L LE worse than R, worsening with fatigue, some foot slap and visible incoordination L LE    ADL: ADL Overall ADL's : Needs assistance/impaired Eating/Feeding: Set up, Sitting Grooming: Minimal assistance, Sitting Upper Body Bathing: Set up, Sitting Lower Body Bathing: Moderate assistance (with min A sit<>stand) Upper Body Dressing : Moderate assistance, Sitting Lower Body Dressing: Moderate assistance (with min A sit<>stand) Toilet Transfer: Minimal assistance, Ambulation, RW, Regular Toilet, Grab bars Toileting- Clothing Manipulation and Hygiene: Minimal assistance (with min A sit<>stand)  Cognition: Cognition Overall Cognitive Status: Within Functional Limits for tasks assessed Orientation Level: Oriented to person, Oriented to situation, Disoriented to time, Oriented to place Cognition Arousal/Alertness: Awake/alert Behavior During Therapy: WFL for tasks assessed/performed Overall Cognitive Status: Within Functional Limits for tasks assessed    Blood pressure 137/70, pulse 62, temperature 97.8 F (36.6 C), temperature source Oral, resp. rate 18, height 5\' 11"  (1.803 m), weight 90.9 kg (200 lb 6.4 oz), SpO2 93 %. Physical Exam  Nursing note and vitals reviewed. Constitutional: He is oriented to person, place, and time. He appears well-developed and well-nourished.  HENT:  Head: Normocephalic and atraumatic.  Eyes:  Conjunctivae are normal. Pupils are equal, round, and reactive to light.  Neck: Muscular tenderness present. Decreased range of motion present.  Unable to turn head to left due to significant pain.  Cardiovascular: Normal rate and regular rhythm.  Respiratory: Effort normal and breath sounds normal. No respiratory distress. He has no wheezes.  GI: He exhibits distension. Bowel sounds are decreased. There is tenderness.  Musculoskeletal: He exhibits edema (1+ edema pedally).  Neck pain with ROM bilateral hips and LUE.  Neurological: He is alert and oriented to person, place, and time. Coordination abnormal.  Left inattention due to neck and shoulder pain limiting ROM.Marland Kitchen Anxious appearing but speech clear and is able to follow basic commands without difficulty. RUE 4+/5. L- deltoid 1+/5,1+ biceps, 3+ to 4- triceps, intrinsics 4+/5. BLE 3+/5 with ataxia. Some upper ext giveaway strength due to pain radiating to neck. Did not appreciate any sensory loss. DTR's 1+  Skin: Skin is warm and dry.  Healing abrasions bilateral shins  Psychiatric: His speech is normal. His mood appears anxious. His affect is blunt. He is slowed.     Lab Results Last 48 Hours    Results for orders placed or performed during the hospital encounter of 10/20/14 (from the past 48 hour(s))  Glucose, capillary Status: Abnormal   Collection Time: 10/23/14 11:43 AM  Result Value Ref Range   Glucose-Capillary 184 (H) 70 - 99 mg/dL  Urinalysis, Routine w reflex microscopic Status: Abnormal   Collection Time: 10/23/14 1:25 PM  Result Value Ref Range  Color, Urine AMBER (A) YELLOW    Comment: BIOCHEMICALS MAY BE AFFECTED BY COLOR   APPearance CLEAR CLEAR   Specific Gravity, Urine 1.035 (H) 1.005 - 1.030   pH 6.0 5.0 - 8.0   Glucose, UA >1000 (A) NEGATIVE mg/dL   Hgb urine dipstick NEGATIVE NEGATIVE   Bilirubin Urine NEGATIVE NEGATIVE   Ketones, ur 40 (A)  NEGATIVE mg/dL   Protein, ur NEGATIVE NEGATIVE mg/dL   Urobilinogen, UA 1.0 0.0 - 1.0 mg/dL   Nitrite NEGATIVE NEGATIVE   Leukocytes, UA NEGATIVE NEGATIVE  Urine microscopic-add on Status: None   Collection Time: 10/23/14 1:25 PM  Result Value Ref Range   Squamous Epithelial / LPF RARE RARE   WBC, UA 0-2 <3 WBC/hpf   RBC / HPF 3-6 <3 RBC/hpf   Bacteria, UA RARE RARE  Glucose, capillary Status: Abnormal   Collection Time: 10/23/14 4:43 PM  Result Value Ref Range   Glucose-Capillary 178 (H) 70 - 99 mg/dL  Glucose, capillary Status: Abnormal   Collection Time: 10/23/14 9:51 PM  Result Value Ref Range   Glucose-Capillary 185 (H) 70 - 99 mg/dL   Comment 1 Notify RN    Comment 2 Document in Chart   Glucose, capillary Status: Abnormal   Collection Time: 10/24/14 8:30 AM  Result Value Ref Range   Glucose-Capillary 138 (H) 70 - 99 mg/dL  Glucose, capillary Status: Abnormal   Collection Time: 10/24/14 11:51 AM  Result Value Ref Range   Glucose-Capillary 153 (H) 70 - 99 mg/dL  Glucose, capillary Status: Abnormal   Collection Time: 10/24/14 4:33 PM  Result Value Ref Range   Glucose-Capillary 154 (H) 70 - 99 mg/dL  Glucose, capillary Status: Abnormal   Collection Time: 10/24/14 9:13 PM  Result Value Ref Range   Glucose-Capillary 196 (H) 70 - 99 mg/dL  Glucose, capillary Status: Abnormal   Collection Time: 10/25/14 7:53 AM  Result Value Ref Range   Glucose-Capillary 137 (H) 70 - 99 mg/dL      Imaging Results (Last 48 hours)    No results found.       Medical Problem List and Plan: 1. 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 DVT Prophylaxis/Anticoagulation: Mechanical:  Antiembolism stockings, knee (TED hose) Bilateral lower extremities Sequential compression  devices, below knee Bilateral lower extremities 3. Pain Management: Duragesic patch started yesterday. Is currently on IV morphine as well as hydrocodone prn for pain management. Will d/c latter and start on prn oxycodone--discussed regimen with patient and wife. Titrate meds as needed.  4. Mood: Team to provide ego support. LCSW to follow for evaluation and support.  5. Neuropsych: This patient is capable of making decisions on his own behalf. 6. Skin/Wound Care: Routine pressure relief measures. Maintain adequate nutritional support.  7. Fluids/Electrolytes/Nutrition: Monitor I/O. Check lytes in am.  8. 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 Encephalopathy resolving but will check ammonia levels. 9. Thrombocytopenia: Stable at 26 on most recent check. Check CBC in am. Monitor for any signs of bleeding.  10. DM type 2: Hgb A1c- 8.7. Monitor BS with ac/hs checks and will use SSI for elevated BS. BS are slowly trending upwards with improvement in po intake. Will resume Tradjenta as was on Invokana and Tradjenta at home. Titrate medications as indicated.  11. Reactive leucocytosis: Slowly resolving with treatment of infection.  12. Constipation: Was started on senna and miralax yesterday without results. KUB to rule out ileus v/s  ascites as with distension and significant discomfort. Enema past admission.  13. Insomnia: Work on pain management as this is major factor affecting sleep.    Post Admission Physician Evaluation: 1. Functional deficits secondary to cervical epidural abscess with myelopathy and left C5 radiculopathy. 2. Patient is admitted to receive collaborative, interdisciplinary care between the physiatrist, rehab nursing staff, and therapy team. 3. Patient's level of medical complexity and substantial therapy needs in context of that medical necessity cannot be provided at a lesser intensity of care such as a SNF. 4. Patient has experienced  substantial functional loss from his/her baseline which was documented above under the "Functional History" and "Functional Status" headings. Judging by the patient's diagnosis, physical exam, and functional history, the patient has potential for functional progress which will result in measurable gains while on inpatient rehab. These gains will be of substantial and practical use upon discharge in facilitating mobility and self-care at the household level. 5. Physiatrist will provide 24 hour management of medical needs as well as oversight of the therapy plan/treatment and provide guidance as appropriate regarding the interaction of the two. 6. 24 hour rehab nursing will assist with bladder management, bowel management, safety, skin/wound care, disease management, medication administration, pain management and patient education and help integrate therapy concepts, techniques,education, etc. 7. PT will assess and treat for/with: Lower extremity strength, range of motion, stamina, balance, functional mobility, safety, adaptive techniques and equipment, pain mgt, orthotics, NMR, community reintegration. Goals are: mod I. 8. OT will assess and treat for/with: ADL's, functional mobility, safety, upper extremity strength, adaptive techniques and equipment, NMR, orthotics, ego support, pain control, pt education . Goals are: mod I to set up. Therapy may proceed with showering this patient. 9. SLP will assess and treat for/with: cognition, communication. Goals are: mod I. 10. Case Management and Social Worker will assess and treat for psychological issues and discharge planning. 11. Team conference will be held weekly to assess progress toward goals and to determine barriers to discharge. 12. Patient will receive at least 3 hours of therapy per day at least 5 days per week. 13. ELOS: 9-13 days  14. Prognosis: excellent     Meredith Staggers, MD, [facility] 10/25/2014

## 2014-10-26 ENCOUNTER — Inpatient Hospital Stay (HOSPITAL_COMMUNITY): Payer: Managed Care, Other (non HMO)

## 2014-10-26 DIAGNOSIS — K913 Postprocedural intestinal obstruction: Secondary | ICD-10-CM

## 2014-10-26 LAB — CBC WITH DIFFERENTIAL/PLATELET
Basophils Absolute: 0.1 10*3/uL (ref 0.0–0.1)
Basophils Relative: 0 % (ref 0–1)
EOS ABS: 0.1 10*3/uL (ref 0.0–0.7)
EOS PCT: 1 % (ref 0–5)
HCT: 35.4 % — ABNORMAL LOW (ref 39.0–52.0)
Hemoglobin: 12.4 g/dL — ABNORMAL LOW (ref 13.0–17.0)
LYMPHS ABS: 1.8 10*3/uL (ref 0.7–4.0)
Lymphocytes Relative: 15 % (ref 12–46)
MCH: 31.5 pg (ref 26.0–34.0)
MCHC: 35 g/dL (ref 30.0–36.0)
MCV: 89.8 fL (ref 78.0–100.0)
MONOS PCT: 10 % (ref 3–12)
Monocytes Absolute: 1.2 10*3/uL — ABNORMAL HIGH (ref 0.1–1.0)
Neutro Abs: 8.8 10*3/uL — ABNORMAL HIGH (ref 1.7–7.7)
Neutrophils Relative %: 73 % (ref 43–77)
PLATELETS: 26 10*3/uL — AB (ref 150–400)
RBC: 3.94 MIL/uL — ABNORMAL LOW (ref 4.22–5.81)
RDW: 16.3 % — ABNORMAL HIGH (ref 11.5–15.5)
WBC: 12 10*3/uL — ABNORMAL HIGH (ref 4.0–10.5)

## 2014-10-26 LAB — GLUCOSE, CAPILLARY
GLUCOSE-CAPILLARY: 217 mg/dL — AB (ref 70–99)
GLUCOSE-CAPILLARY: 215 mg/dL — AB (ref 70–99)
Glucose-Capillary: 147 mg/dL — ABNORMAL HIGH (ref 70–99)
Glucose-Capillary: 165 mg/dL — ABNORMAL HIGH (ref 70–99)

## 2014-10-26 LAB — COMPREHENSIVE METABOLIC PANEL
ALBUMIN: 1.9 g/dL — AB (ref 3.5–5.2)
ALT: 18 U/L (ref 0–53)
ANION GAP: 9 (ref 5–15)
AST: 33 U/L (ref 0–37)
Alkaline Phosphatase: 89 U/L (ref 39–117)
BUN: 14 mg/dL (ref 6–23)
CALCIUM: 8.3 mg/dL — AB (ref 8.4–10.5)
CO2: 25 mmol/L (ref 19–32)
CREATININE: 0.67 mg/dL (ref 0.50–1.35)
Chloride: 97 mmol/L (ref 96–112)
GFR calc Af Amer: >90 mL/min (ref >90)
Glucose, Bld: 199 mg/dL — ABNORMAL HIGH (ref 70–99)
Potassium: 4.2 mmol/L (ref 3.5–5.1)
Sodium: 131 mmol/L — ABNORMAL LOW (ref 135–145)
Total Bilirubin: 2.4 mg/dL — ABNORMAL HIGH (ref 0.3–1.2)
Total Protein: 6.8 g/dL (ref 6.0–8.3)

## 2014-10-26 LAB — AMMONIA: Ammonia: 56 umol/L — ABNORMAL HIGH (ref 11–32)

## 2014-10-26 MED ORDER — MORPHINE SULFATE 15 MG PO TABS
15.0000 mg | ORAL_TABLET | ORAL | Status: DC | PRN
  Administered 2014-10-27 – 2014-11-03 (×5): 15 mg via ORAL
  Filled 2014-10-26 (×7): qty 1

## 2014-10-26 MED ORDER — METOCLOPRAMIDE HCL 5 MG/ML IJ SOLN
5.0000 mg | Freq: Four times a day (QID) | INTRAMUSCULAR | Status: DC
  Administered 2014-10-26 – 2014-10-27 (×4): 5 mg via INTRAVENOUS
  Filled 2014-10-26 (×7): qty 1

## 2014-10-26 MED ORDER — NALOXONE HCL 0.4 MG/ML IJ SOLN
0.2000 mg | INTRAMUSCULAR | Status: DC | PRN
  Administered 2014-10-26: 0.2 mg via INTRAVENOUS
  Filled 2014-10-26: qty 1

## 2014-10-26 MED ORDER — LIDOCAINE 5 % EX PTCH
2.0000 | MEDICATED_PATCH | CUTANEOUS | Status: DC
  Administered 2014-10-26: 2 via TRANSDERMAL
  Filled 2014-10-26 (×2): qty 2

## 2014-10-26 MED ORDER — OXYCODONE HCL 5 MG PO TABS
5.0000 mg | ORAL_TABLET | ORAL | Status: DC | PRN
  Administered 2014-10-26 – 2014-11-11 (×32): 5 mg via ORAL
  Filled 2014-10-26 (×32): qty 1

## 2014-10-26 MED ORDER — METOCLOPRAMIDE HCL 5 MG/ML IJ SOLN
10.0000 mg | Freq: Four times a day (QID) | INTRAMUSCULAR | Status: DC
  Administered 2014-10-26 (×2): 10 mg via INTRAVENOUS
  Filled 2014-10-26 (×5): qty 2

## 2014-10-26 MED ORDER — POLYETHYLENE GLYCOL 3350 17 G PO PACK
17.0000 g | PACK | Freq: Three times a day (TID) | ORAL | Status: DC
  Administered 2014-10-26 – 2014-10-30 (×12): 17 g via ORAL
  Filled 2014-10-26 (×16): qty 1

## 2014-10-26 NOTE — Progress Notes (Signed)
Nursing Note: Pt stood with assistance and voided 450 cc.Pt states he still feels there is more. A: Pt scanned for .500 cc.R; pt in and out cathed for 650 cc of amber,clear urine. Pt tolerated well.wbb

## 2014-10-26 NOTE — Progress Notes (Signed)
Patient information reviewed and entered into eRehab system by Jerrard Bradburn, RN, CRRN, PPS Coordinator.  Information including medical coding and functional independence measure will be reviewed and updated through discharge.    

## 2014-10-26 NOTE — Progress Notes (Signed)
Administered soap suds enema per MD order.  Minimal brown liquid stool returned with some softer stool towards end of pushing.  Patient now back in bed resting but abdomen is still distended.  Algis Liming, PA notified of results, new orders received.  Will continue to monitor.  Brita Romp, RN

## 2014-10-26 NOTE — Progress Notes (Signed)
[place] PHYSICAL MEDICINE & REHABILITATION     PROGRESS NOTE    Subjective/Complaints: Continued abdominal distention this morning. A little better after fleet enema. Doesn't have frank abdominal pain but uncomfortable. Wife states oxycodone makes him a little loopy, especially the 10mg .  Objective: Vital Signs: Blood pressure 138/55, pulse 54, temperature 98.6 F (37 C), temperature source Oral, resp. rate 16, height 5\' 10"  (1.778 m), weight 93.8 kg (206 lb 12.7 oz), SpO2 93 %. Dg Abd 1 View  10/25/2014   CLINICAL DATA:  64 year old male with a history of abdominal and back pain  EXAM: ABDOMEN - 1 VIEW  COMPARISON:  CT abdomen 02/22/2007  FINDINGS: Single frontal view of the lower abdomen demonstrates multiple air filled small bowel loops. Single loop in the left lower quadrant is borderline distended.  Gas within distal descending colon and sigmoid colon.  No abnormal calcifications identified  No displaced fracture.  IMPRESSION: Multiple air filled small bowel loops, without evidence of obstruction as gas extends through the sigmoid colon to the rectum. Findings can be seen in enteritis, or potentially an intermittent obstruction. If there is concern for acute abdominal process, CT may be useful.  Signed,  Dulcy Fanny. Earleen Newport, DO  [facility]  [facility]   Electronically Signed   By: Corrie Mckusick D.O.   On: 10/25/2014 21:29   No results for input(s): WBC, HGB, HCT, PLT in the last 72 hours. No results for input(s): NA, K, CL, GLUCOSE, BUN, CREATININE, CALCIUM in the last 72 hours.  Invalid input(s): CO CBG (last 3)   Recent Labs  10/25/14 1714 10/25/14 2049 10/26/14 0652  GLUCAP 192* 190* 147*    Wt Readings from Last 3 Encounters:  10/26/14 93.8 kg (206 lb 12.7 oz)  06/13/14 88.905 kg (196 lb)  02/07/14 89.812 kg (198 lb)    Physical Exam:  Nursing note and vitals reviewed. Constitutional: He is oriented to person, place, and  time. He appears well-developed and well-nourished.  HENT:  Head: Normocephalic and atraumatic.  Eyes: Conjunctivae are normal. Pupils are equal, round, and reactive to light.  Neck: Muscular tenderness present. Decreased range of motion present.     Cardiovascular: Normal rate and regular rhythm.  Respiratory: Effort normal and breath sounds normal. No respiratory distress. He has no wheezes.  GI: He exhibits distension. Bowel sounds are decreased, high-pitched. There is minimal tenderness.  Musculoskeletal: He exhibits edema (1+ edema pedally).  Neck pain with ROM bilateral hips and LUE.  Neurological: He is alert and oriented to person, place, and time. Coordination abnormal.  Left inattention due to neck and shoulder pain limiting ROM.Marland Kitchen Anxious appearing but speech clear and is able to follow basic commands without difficulty. RUE 4+/5. L- deltoid 1+/5,1+ biceps, 3+ to 4- triceps, intrinsics 4+/5. BLE 3+/5 with ataxia. Some upper ext giveaway strength due to pain radiating to neck. Did not appreciate any sensory loss. DTR's 1+  Skin: Skin is warm and dry.  Healing abrasions bilateral shins  Psychiatric: His speech is normal. His mood appears a little anxious. He's alert  Assessment/Plan: 1. Functional deficits secondary to cervical epidural abscess which require 3+ hours per day of interdisciplinary therapy in a comprehensive inpatient rehab setting. Physiatrist is providing close team supervision and 24 hour management of active medical problems listed below. Physiatrist and rehab team continue to assess barriers to discharge/monitor patient progress toward functional and medical goals. FIM:       FIM - Toileting Toileting: 1: Two  helpers           Comprehension Comprehension Mode: Auditory Comprehension: 5-Understands complex 90% of the time/Cues < 10% of the time  Expression Expression Mode: Verbal Expression: 5-Expresses basic needs/ideas: With extra  time/assistive device  Social Interaction Social Interaction: 5-Interacts appropriately 90% of the time - Needs monitoring or encouragement for participation or interaction.  Problem Solving Problem Solving: 5-Solves basic 90% of the time/requires cueing < 10% of the time  Memory Memory: 5-Recognizes or recalls 90% of the time/requires cueing < 10% of the time  Medical Problem List and Plan: 1. 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 DVT Prophylaxis/Anticoagulation: Mechanical:  Antiembolism stockings, knee (TED hose) Bilateral lower extremities Sequential compression devices, below knee Bilateral lower extremities 3. Pain Management: Duragesic patch 12.mcgt. Decrease oxycodone to 5mg  prn given neurosedation. 4. Mood: Team to provide ego support. .  5. Neuropsych: This patient is capable of making decisions on his own behalf. 6. Skin/Wound Care: Routine pressure relief measures. Maintain adequate nutritional support.  7. Fluids/Electrolytes/Nutrition: Monitor I/O. Check lytes today.  8. 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 Ammonia level pending. 9. Thrombocytopenia: Stable at 26 on most recent check. Labs pending 10. DM type 2: Hgb A1c- 8.7. Monitor BS with ac/hs checks and will use SSI for elevated BS. BS are slowly trending upwards with improvement in po intake. resumed Tradjenta as was on Invokana and Tradjenta at home. Titrate medications as indicated---consvt for now given ileus 11. Reactive leucocytosis: Slowly resolving with treatment of infection.  12. Ileus: SSE, iv reglan, clear liquid diet.  13. Insomnia:   LOS (Days) 1 A FACE TO FACE EVALUATION WAS PERFORMED  Rylan Kaufmann T 10/26/2014 7:58 AM

## 2014-10-26 NOTE — Evaluation (Signed)
Speech Language Pathology Assessment and Plan  Patient Details  Name: [name] MRN: [id number] Date of Birth: 07/18/1950  SLP Diagnosis: Cognitive Impairments  Rehab Potential: Excellent ELOS: 9-13 days    Today's Date: 10/26/2014 SLP Individual Time: 1030-1130 SLP Individual Time Calculation (min): 60 min   Problem List:  Patient Active Problem List   Diagnosis Date Noted  . Ileus, postoperative 10/26/2014  . Left cervical radiculopathy 10/25/2014  . Cervical myelopathy 10/25/2014  . Thrombocytopenia 10/22/2014  . Group B streptococcal infection   . Abscess in epidural space of cervical spine   . Encephalopathy acute   . Streptococcal bacteremia   . NASH (nonalcoholic steatohepatitis)   . Other cirrhosis of liver   . Diabetes mellitus type 2, uncontrolled   . 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 UGIB (upper gastrointestinal bleed) 10/27/2012  . Anemia due to acute blood loss 10/27/2012  . Volume depletion 10/27/2012  . 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 GERD 03/28/2010  . CONSTIPATION 03/28/2010  . ABDOMINAL PAIN, GENERALIZED 03/28/2010  . ASCITES 06/13/2008  . PERSONAL HX COLONIC POLYPS 06/13/2008  . DM 09/27/2007  . OBESITY 09/27/2007  . PANCYTOPENIA 09/27/2007  . HYPERTENSION 09/27/2007  . INTERNAL HEMORRHOIDS 09/27/2007  . CIRRHOSIS 09/27/2007  . NEPHROLITHIASIS 09/27/2007  . IRON DEFICIENCY ANEMIA, HX OF 09/27/2007  . Personal history of other diseases of digestive system 09/27/2007  . COLONIC POLYPS, ADENOMATOUS 11/19/2006  . ESOPHAGEAL VARICES 11/19/2006  . DUODENITIS 11/19/2006  . ESOPHAGITIS, REFLUX 09/23/2004  . ESOPHAGEAL STRICTURE 09/23/2004  . Portal hypertension 09/23/2004  . DIVERTICULOSIS, COLON 08/14/2004   Past Medical History:  Past Medical History  Diagnosis Date  . Reflux esophagitis   . Portal hypertension   . Stricture and stenosis of esophagus   . Diverticulosis of colon (without mention of hemorrhage)    . Internal hemorrhoids without mention of complication   . Benign neoplasm of colon   . Duodenitis without mention of hemorrhage   . 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 Personal history of diseases of blood and blood-forming organs   . Cirrhosis of liver without mention of alcohol   . Unspecified essential hypertension   . Obesity, unspecified   . Type II or unspecified type diabetes mellitus without mention of complication, not stated as uncontrolled   . GERD (gastroesophageal reflux disease)   . Hemorrhoids   . Colon polyps   . Pancytopenia   . Calculus of kidney yrs ago   Past Surgical History:  Past Surgical History  Procedure Laterality Date  . Kidney stone surgery  yrs ago  . Esophagogastroduodenoscopy  08/01/2011    Procedure: ESOPHAGOGASTRODUODENOSCOPY (EGD);  Surgeon: Scarlette Shorts, MD;  Location: [facility];  Service: Endoscopy;  Laterality: N/A;  . Esophagogastroduodenoscopy N/A 10/27/2012    Procedure: ESOPHAGOGASTRODUODENOSCOPY (EGD);  Surgeon: Inda Castle, MD;  Location: [facility];  Service: Endoscopy;  Laterality: N/A;  . Esophagogastroduodenoscopy N/A 11/22/2012    Procedure: ESOPHAGOGASTRODUODENOSCOPY (EGD);  Surgeon: Irene Shipper, MD;  Location: [facility];  Service: Endoscopy;  Laterality: N/A;  . Colonoscopy    . Upper gastrointestinal endoscopy    . Esophagogastroduodenoscopy N/A 11/29/2013    Procedure: ESOPHAGOGASTRODUODENOSCOPY (EGD);  Surgeon: Irene Shipper, MD;  Location: [facility];  Service: Endoscopy;  Laterality: N/A;  . Esophageal banding Bilateral 11/29/2013    Procedure: ESOPHAGEAL BANDING;  Surgeon: Irene Shipper, MD;  Location: [facility];  Service: Endoscopy;  Laterality: Bilateral;  . Esophagogastroduodenoscopy N/A 06/13/2014  Procedure: ESOPHAGOGASTRODUODENOSCOPY (EGD);  Surgeon: Irene Shipper, MD;  Location: [facility];  Service: Endoscopy;  Laterality: N/A;  . Esophageal banding N/A 06/13/2014    Procedure: ESOPHAGEAL  BANDING;  Surgeon: Irene Shipper, MD;  Location: [facility];  Service: Endoscopy;  Laterality: N/A;    Assessment / Plan / Recommendation Clinical Impression [name] is a 65 year old male with history of hepatic cirrhosis (with portal HTN/ascites/variceal bleed/thrombocytopenia) due to NASH, DM type 2 with neuropathy; who was has had neck pain with malaise. MRI cervical spine revealed epidural abscess with enhancing phlegmonous change from C1-C 6-7 with moderate to severe diffuse canal stenosis with flattening and displacement of cord, multifocal abscesses involving posterior paraspinous musculature predominantly on the left and abnormal dural enhancement extending superiorly through the foramen magnum. CT head negative for acute changes. Patient somnolent and encephalopathic with left sided neglect and quadriparesis at admission. Dr. Saintclair Halsted consulted for input and recommended management with IV antibiotics as extremely high surgical risk due to multiple comorbidities as well as coagulopathy. Dr. Tommy Medal consulted and recommended adding IV ceftriaxone to IV Vancomycin for CNS penetration. Mentation improving with decrease in left inattention and improvement in LLE weakness. Therapy initiated and patient limited by unsteady gait, fatigue and diffuse weakness with poor coordination. CIR was recommended by MD and rehab team and patient admitted today. Pt initially found coughing on thin liquids while in bed in semi-reclined position. While sitting upright in chair, no overt s/s of aspiration are observed across challenging, and pt remains afebrile. Recommend to continue with current diet with advancement to solids per medical team. Cognitively, pt has difficulty with mildly complex problem solving due in large part to decreased working memory, likely impacted by acute encephalopathy and medications, and he will benefit from skilled SLP to maximize functional independence.     Skilled Therapeutic  Interventions          Cognitive-linguistic and bedside swallow evaluations completed with results and recommendations reviewed with patient. Pt required mod cues for completion of money management task, demonstrating adequate emergent awareness of difficulties.    SLP Assessment  Patient will need skilled Speech Lanaguage Pathology Services during CIR admission    Recommendations  Diet Recommendations: Regular;Thin liquid (solid advancement per medical team) Liquid Administration via: Cup;Straw Medication Administration: Whole meds with liquid Supervision: Patient able to self feed Compensations: Slow rate;Small sips/bites Postural Changes and/or Swallow Maneuvers: Out of bed for meals;Seated upright 90 degrees Oral Care Recommendations: Oral care BID Patient destination: Home Follow up Recommendations: 24 hour supervision/assistance;Home Health SLP Equipment Recommended: None recommended by SLP    SLP Frequency 3 to 5 out of 7 days   SLP Treatment/Interventions Cognitive remediation/compensation;Cueing hierarchy;Environmental controls;Functional tasks;Internal/external aids;Medication managment;Patient/family education    Pain Pain Assessment Pain Assessment: No/denies pain Prior Functioning Cognitive/Linguistic Baseline: Within functional limits Type of Home: House  Lives 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 Available Help at Discharge: Family Vocation: Full time employment  Short Term Goals: Week 1: SLP Short Term Goal 1 (Week 1): Pt will utilize external/internal aids to facilitate recall of new information during functional tasks with Min cues SLP Short Term Goal 2 (Week 1): Pt will demonstrate mildly complex problem solving to complete functional tasks with Min cues  See FIM for current functional status Refer to Care Plan for Long Term Goals  Recommendations for other services: None  Discharge Criteria: Patient will be discharged from SLP if patient refuses treatment 3 consecutive times  without medical reason, if treatment goals  not met, if there is a change in medical status, if patient makes no progress towards goals or if patient is discharged from hospital.  The above assessment, treatment plan, treatment alternatives and goals were discussed and mutually agreed upon: by patient  Germain Osgood, M.A. CCC-SLP [phone]  Germain Osgood 10/26/2014, 1:58 PM

## 2014-10-26 NOTE — Progress Notes (Signed)
Patient noted to be very drowsy all day.  Oxycodone initially cut back to 5mg  po q4h prn pain.  Patient has been complaining of pain intermittently throughout the day, but falls asleep before nurse can come down and assess patients pain.  Algis Liming, PA aware of findings, ordered narcan 0.2mg  IV to be given and to hold any additional oxycodone and offer patient tylenol for pain management.  Administered narcan per order, patient easily aroused but still unable to keep eyes open during conversation.  Will continue to monitor. Brita Romp, RN

## 2014-10-26 NOTE — Evaluation (Signed)
Physical Therapy Assessment and Plan  Patient Details  Name: [name] MRN: [id number] Date of Birth: [birth year]  PT Diagnosis: Difficulty walking, Hemiparesis non-dominant and Muscle weakness Rehab Potential: Good ELOS: 10 - 12 days   Today's Date: 10/26/2014 PT Individual Time: 1450-1520 PT Individual Time Calculation (min): 30 min   Today's Date: 10/26/2014 PT Missed Time: 30 Minutes Missed Time Reason: Patient fatigue; Patient getting enema in am. Attempted to see patient at end of day. Patient participated in eval and treatment in bed. Patient requested not to get up until tomorrow.  Problem List:  Patient Active Problem List   Diagnosis Date Noted  . Ileus, postoperative 10/26/2014  . Left cervical radiculopathy 10/25/2014  . Cervical myelopathy 10/25/2014  . Thrombocytopenia 10/22/2014  . Group B streptococcal infection   . Abscess in epidural space of cervical spine   . Encephalopathy acute   . Streptococcal bacteremia   . NASH (nonalcoholic steatohepatitis)   . Other cirrhosis of liver   . Diabetes mellitus type 2, uncontrolled   . 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 UGIB (upper gastrointestinal bleed) 10/27/2012  . Anemia due to acute blood loss 10/27/2012  . Volume depletion 10/27/2012  . 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 GERD 03/28/2010  . CONSTIPATION 03/28/2010  . ABDOMINAL PAIN, GENERALIZED 03/28/2010  . ASCITES 06/13/2008  . PERSONAL HX COLONIC POLYPS 06/13/2008  . DM 09/27/2007  . OBESITY 09/27/2007  . PANCYTOPENIA 09/27/2007  . HYPERTENSION 09/27/2007  . INTERNAL HEMORRHOIDS 09/27/2007  . CIRRHOSIS 09/27/2007  . NEPHROLITHIASIS 09/27/2007  . IRON DEFICIENCY ANEMIA, HX OF 09/27/2007  . Personal history of other diseases of digestive system 09/27/2007  . COLONIC POLYPS, ADENOMATOUS 11/19/2006  . ESOPHAGEAL VARICES 11/19/2006  . DUODENITIS 11/19/2006  . ESOPHAGITIS, REFLUX 09/23/2004  . ESOPHAGEAL STRICTURE 09/23/2004  .  Portal hypertension 09/23/2004  . DIVERTICULOSIS, COLON 08/14/2004    Past Medical History:  Past Medical History  Diagnosis Date  . Reflux esophagitis   . Portal hypertension   . Stricture and stenosis of esophagus   . Diverticulosis of colon (without mention of hemorrhage)   . Internal hemorrhoids without mention of complication   . Benign neoplasm of colon   . Duodenitis without mention of hemorrhage   . 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 Personal history of diseases of blood and blood-forming organs   . Cirrhosis of liver without mention of alcohol   . Unspecified essential hypertension   . Obesity, unspecified   . Type II or unspecified type diabetes mellitus without mention of complication, not stated as uncontrolled   . GERD (gastroesophageal reflux disease)   . Hemorrhoids   . Colon polyps   . Pancytopenia   . Calculus of kidney yrs ago   Past Surgical History:  Past Surgical History  Procedure Laterality Date  . Kidney stone surgery  yrs ago  . Esophagogastroduodenoscopy  08/01/2011    Procedure: ESOPHAGOGASTRODUODENOSCOPY (EGD);  Surgeon: Scarlette Shorts, MD;  Location: [facility];  Service: Endoscopy;  Laterality: N/A;  . Esophagogastroduodenoscopy N/A 10/27/2012    Procedure: ESOPHAGOGASTRODUODENOSCOPY (EGD);  Surgeon: Inda Castle, MD;  Location: [facility];  Service: Endoscopy;  Laterality: N/A;  . Esophagogastroduodenoscopy N/A 11/22/2012    Procedure: ESOPHAGOGASTRODUODENOSCOPY (EGD);  Surgeon: Irene Shipper, MD;  Location: [facility];  Service: Endoscopy;  Laterality: N/A;  . Colonoscopy    . Upper gastrointestinal endoscopy    . Esophagogastroduodenoscopy N/A 11/29/2013    Procedure: ESOPHAGOGASTRODUODENOSCOPY (EGD);  Surgeon: Jenny Reichmann  Delice Lesch, MD;  Location: [facility];  Service: Endoscopy;  Laterality: N/A;  . Esophageal banding Bilateral 11/29/2013    Procedure: ESOPHAGEAL BANDING;  Surgeon: Irene Shipper, MD;  Location: [facility];  Service:  Endoscopy;  Laterality: Bilateral;  . Esophagogastroduodenoscopy N/A 06/13/2014    Procedure: ESOPHAGOGASTRODUODENOSCOPY (EGD);  Surgeon: Irene Shipper, MD;  Location: [facility];  Service: Endoscopy;  Laterality: N/A;  . Esophageal banding N/A 06/13/2014    Procedure: ESOPHAGEAL BANDING;  Surgeon: Irene Shipper, MD;  Location: [facility];  Service: Endoscopy;  Laterality: N/A;    Assessment & Plan Clinical Impression: [name] is a 64 year old male with history of hepatic cirrhosis (with portal HTN/ascites/variceal bleed/thrombocytopenia) due to NASH, DM type 2 with neuropathy; who was has had neck pain with malaise for the past 2 weeks. He had blood cultures drawn by MD which was positive for gram-positive cocci. He was instructed to [facility] ED on 10/21/14 due to concerns of spine infection and was transferred to [facility] for evaluation. MRI cervical spine revealed epidural abscess with enhancing phlegmonous change from C1-C 6-7 with moderate to severe diffuse canal stenosis with flattening and displacement of cord, multifocal abscesses involving posterior paraspinous musculature predominantly on the left and abnormal dural enhancement extending superiorly through the foramen magnum. CT head negative for acute changes. Patient somnolent and encephalopathic with left sided neglect and quadriparesis at admission. Dr. Saintclair Halsted consulted for input and recommended management with IV antibiotics as extremely high surgical risk due to multiple comorbidities as well as coagulopathy.   Dr. Tommy Medal consulted and recommended adding IV ceftriaxone to IV Vancomycin for CNS penetration. CRP- 27.4 and sed rate 62. 2D echo with EF 65-70%, mild to moderate left atrial enlargement and mild aortic sclerosis without stenosis. TEE not done due to inability to manipulate neck. Repeat cultures positive for Group B streptococci and patient to continue on IV ceftriaxone 2 grams bid while hospitalized and change to 2  grams daily at discharge thorough June 9th for 8 weeks antibiotic therapy. Will need repeat MRI spine at 6-8 weeks with follow up with ID in 4 weeks. Mentation improving with decrease in left inattention and improvement in LLE weakness. Therapy initiated and patient limited by unsteady gait, fatigue and diffuse weakness with poor coordination.  Patient transferred to CIR on 10/25/2014 .   Patient currently requires min with mobility secondary to muscle weakness, decreased coordination and decreased standing balance.  Prior to hospitalization, patient was independent  with mobility and lived with Spouse in a House home.  Home access is 2Stairs to enter.  Patient will benefit from skilled PT intervention to maximize safe functional mobility, minimize fall risk and decrease caregiver burden for planned discharge home with intermittent assist.  Anticipate patient will benefit from follow up [facility] at discharge.  PT - End of Session Activity Tolerance: Decreased this session Endurance Deficit: Yes PT Assessment Rehab Potential (ACUTE/IP ONLY): Good Barriers to Discharge: Inaccessible home environment PT Patient demonstrates impairments in the following area(s): Balance;Endurance;Motor PT Transfers Functional Problem(s): Bed Mobility;Bed to Chair;Car PT Locomotion Functional Problem(s): Ambulation;Wheelchair Mobility;Stairs PT Plan PT Intensity: Minimum of 1-2 x/day ,45 to 90 minutes PT Frequency: 5 out of 7 days PT Duration Estimated Length of Stay: 10 - 12 days PT Treatment/Interventions: Ambulation/gait training;Balance/vestibular training;Discharge planning;Functional mobility training;Pain management;Patient/family education;Stair training;Therapeutic Activities;Therapeutic Exercise;UE/LE Strength taining/ROM;UE/LE Coordination activities;Wheelchair propulsion/positioning PT Transfers Anticipated Outcome(s): mod I basic transfers PT Locomotion Anticipated Outcome(s): supervision household ambualtion  with  min assist for 2 steps to access home PT Recommendation Follow Up Recommendations: Home health PT Patient destination: Home Equipment Recommended: Rolling walker with 5" wheels;Wheelchair (measurements)  Skilled Therapeutic Intervention Nurse reported that patient needed enema in am and requested PT change time to afternoon. In pm, patient resting in bed and requesting not to get up due to pain and fatigue. Patient difficult to arouse and keep eyes open throughout session. Patient did participate in initial evaluation including cognitive and motor assessment. Mobility still needs to be evaluated. Patient performed LE active exercise (SAQ's, ankle pumps, heel slides, and hip abduction) with mod cueing to attend to task due to lethargy. Patient left in bed with all items in reach and bed alarm on.   PT Evaluation Precautions/Restrictions Precautions Precautions: Fall Restrictions Weight Bearing Restrictions: No General Chart Reviewed: Yes PT Amount of Missed Time (min): 30 Minutes PT Missed Treatment Reason: Patient fatigue Family/Caregiver Present: No Vital SignsTherapy Vitals Temp: 97.8 F (36.6 C) Temp Source: Oral Pulse Rate: (!) 48 Resp: 16 BP: 127/68 mmHg Patient Position (if appropriate): Lying Pain Pain Assessment Pain Assessment: 0-10 Pain Score: 7  Pain Location: Shoulder Pain Orientation: Left Home Living/Prior Functioning Home Living Available Help at Discharge: Family Type of Home: House Home Access: Stairs to enter Technical brewer of Steps: 2 Entrance Stairs-Rails: None Home Layout: Multi-level;Able to live on main level with bedroom/bathroom Alternate Level Stairs-Number of Steps: flight Alternate Level Stairs-Rails: Right;Left Additional Comments: 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: [place] to Take Stairs?: Yes Driving: Yes Vocation: Full time employment 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     Cognition Overall Cognitive Status: Within Advertising copywriter for tasks assessed Arousal/Alertness: Lethargic (possibly due to medications) Orientation Level: Oriented X4 Attention: Focused (patient unable to keep eyes open to focus on task) Focused Attention: Impaired Sustained Attention: Appears intact Memory: Impaired Memory Impairment: Decreased recall of new information;Other (comment);Storage deficit (working memory) Awareness: Appears intact Problem Solving: Impaired Problem Solving Impairment: Investment banker, corporate Function: Organizing;Self Correcting Organizing: Impaired Organizing Impairment: Functional basic Self Correcting: Impaired Self Correcting Impairment: Functional basic Safety/Judgment: Appears intact Sensation Sensation Light Touch: Appears Intact Light Touch Impaired Details: Impaired LUE Stereognosis: Not tested Hot/Cold: Not tested Proprioception: Not tested Coordination Gross Motor Movements are Fluid and Coordinated: No Fine Motor Movements are Fluid and Coordinated: No Coordination and Movement Description: movements much slower with left extremities. Finger Nose Finger Test: unable to complete with LUE Heel Shin Test: slower on left Motor  Motor Motor: Hemiplegia  Mobility Bed Mobility Bed Mobility: Supine to Sit;Sit to Supine Supine to Sit: 4: Min assist Supine to Sit Details: Manual facilitation for placement;Verbal cues for sequencing Sit to Supine: 3: Mod assist Sit to Supine - Details: Manual facilitation for placement;Verbal cues for sequencing Transfers Sit to Stand: 4: Min assist Sit to Stand Details: Tactile cues for weight shifting;Manual facilitation for weight shifting;Verbal cues for sequencing Stand to Sit: 4: Min assist Stand to Sit Details (indicate cue type and reason): Tactile cues for weight shifting;Manual facilitation for weight shifting;Verbal cues for sequencing Locomotion      Trunk/Postural Assessment  Cervical Assessment Cervical Assessment: Exceptions to [facility] (limited by pain) Thoracic Assessment Thoracic Assessment: Within Functional Limits Lumbar Assessment Lumbar Assessment: Exceptions to [facility] Postural Control Postural Control: Deficits on evaluation  Balance Balance Balance Assessed: Yes Dynamic Sitting Balance Dynamic Sitting - Balance Support: Feet supported;Right upper extremity supported Dynamic Sitting -  Level of Assistance: 5: Stand by assistance Static Standing Balance Static Standing - Balance Support: During functional activity;Bilateral upper extremity supported Static Standing - Level of Assistance: 4: Min assist;5: Stand by assistance Dynamic Standing Balance Dynamic Standing - Balance Support: During functional activity Dynamic Standing - Level of Assistance: 4: Min assist Extremity Assessment  RUE Assessment RUE Assessment: Within Functional Limits LUE Assessment LUE Assessment: Exceptions to [facility] LUE Strength LUE Overall Strength: Deficits LUE Overall Strength Comments: PROM WFL's; shoulder flexion grossly 2/5; elbow flexion 3/5; elbow extension 3-/5; grip fair RLE Assessment RLE Assessment: Within Functional Limits (grossly 4/5) LLE Assessment LLE Assessment: Exceptions to WFL LLE Strength LLE Overall Strength: Deficits LLE Overall Strength Comments: grossly 3+/5  FIM:  FIM - Control and instrumentation engineer Devices: Walker;Bed rails;Arm rests Bed/Chair Transfer: 4: Supine > Sit: Min A (steadying Pt. > 75%/lift 1 leg);3: Sit > Supine: Mod A (lifting assist/Pt. 50-74%/lift 2 legs);4: Bed > Chair or W/C: Min A (steadying Pt. > 75%);4: Chair or W/C > Bed: Min A (steadying Pt. > 75%) FIM - Locomotion: Ambulation Locomotion: Ambulation: 0: Activity did not occur FIM - Locomotion: Stairs Locomotion: Stairs: 0: Activity did not occur   Refer to Care Plan for Long Term Goals  Recommendations for other services:  None  Discharge Criteria: Patient will be discharged from PT if patient refuses treatment 3 consecutive times without medical reason, if treatment goals not met, if there is a change in medical status, if patient makes no progress towards goals or if patient is discharged from hospital.  The above assessment, treatment plan, treatment alternatives and goals were discussed and mutually agreed upon: by patient  Sanjuana Letters 10/26/2014, 3:55 PM

## 2014-10-26 NOTE — Evaluation (Signed)
Occupational Therapy Assessment and Plan  Patient Details  Name: [name] MRN: [id number] Date of Birth: Jan 17, 1951  OT Diagnosis: abnormal posture, cognitive deficits, hemiplegia affecting non-dominant side and muscle weakness (generalized) Rehab Potential: Rehab Potential (ACUTE ONLY): Good ELOS: 10-12 days    Today's Date: 10/26/2014 OT Individual Time: 0800-0900 and 1335-1400 OT Individual Time Calculation (min): 60 min and 25 min     Problem List:  Patient Active Problem List   Diagnosis Date Noted  . Ileus, postoperative 10/26/2014  . Left cervical radiculopathy 10/25/2014  . Cervical myelopathy 10/25/2014  . Thrombocytopenia 10/22/2014  . Group B streptococcal infection   . Abscess in epidural space of cervical spine   . Encephalopathy acute   . Streptococcal bacteremia   . NASH (nonalcoholic steatohepatitis)   . Other cirrhosis of liver   . Diabetes mellitus type 2, uncontrolled   . 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 UGIB (upper gastrointestinal bleed) 10/27/2012  . Anemia due to acute blood loss 10/27/2012  . Volume depletion 10/27/2012  . 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 GERD 03/28/2010  . CONSTIPATION 03/28/2010  . ABDOMINAL PAIN, GENERALIZED 03/28/2010  . ASCITES 06/13/2008  . PERSONAL HX COLONIC POLYPS 06/13/2008  . DM 09/27/2007  . OBESITY 09/27/2007  . PANCYTOPENIA 09/27/2007  . HYPERTENSION 09/27/2007  . INTERNAL HEMORRHOIDS 09/27/2007  . CIRRHOSIS 09/27/2007  . NEPHROLITHIASIS 09/27/2007  . IRON DEFICIENCY ANEMIA, HX OF 09/27/2007  . Personal history of other diseases of digestive system 09/27/2007  . COLONIC POLYPS, ADENOMATOUS 11/19/2006  . ESOPHAGEAL VARICES 11/19/2006  . DUODENITIS 11/19/2006  . ESOPHAGITIS, REFLUX 09/23/2004  . ESOPHAGEAL STRICTURE 09/23/2004  . Portal hypertension 09/23/2004  . DIVERTICULOSIS, COLON 08/14/2004    Past Medical History:  Past Medical History  Diagnosis Date  . Reflux  esophagitis   . Portal hypertension   . Stricture and stenosis of esophagus   . Diverticulosis of colon (without mention of hemorrhage)   . Internal hemorrhoids without mention of complication   . Benign neoplasm of colon   . Duodenitis without mention of hemorrhage   . 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 Personal history of diseases of blood and blood-forming organs   . Cirrhosis of liver without mention of alcohol   . Unspecified essential hypertension   . Obesity, unspecified   . Type II or unspecified type diabetes mellitus without mention of complication, not stated as uncontrolled   . GERD (gastroesophageal reflux disease)   . Hemorrhoids   . Colon polyps   . Pancytopenia   . Calculus of kidney yrs ago   Past Surgical History:  Past Surgical History  Procedure Laterality Date  . Kidney stone surgery  yrs ago  . Esophagogastroduodenoscopy  08/01/2011    Procedure: ESOPHAGOGASTRODUODENOSCOPY (EGD);  Surgeon: Scarlette Shorts, MD;  Location: [facility];  Service: Endoscopy;  Laterality: N/A;  . Esophagogastroduodenoscopy N/A 10/27/2012    Procedure: ESOPHAGOGASTRODUODENOSCOPY (EGD);  Surgeon: Inda Castle, MD;  Location: [facility];  Service: Endoscopy;  Laterality: N/A;  . Esophagogastroduodenoscopy N/A 11/22/2012    Procedure: ESOPHAGOGASTRODUODENOSCOPY (EGD);  Surgeon: Irene Shipper, MD;  Location: [facility];  Service: Endoscopy;  Laterality: N/A;  . Colonoscopy    . Upper gastrointestinal endoscopy    . Esophagogastroduodenoscopy N/A 11/29/2013    Procedure: ESOPHAGOGASTRODUODENOSCOPY (EGD);  Surgeon: Irene Shipper, MD;  Location: [facility];  Service: Endoscopy;  Laterality: N/A;  . Esophageal banding Bilateral 11/29/2013    Procedure: ESOPHAGEAL BANDING;  Surgeon: Jenny Reichmann  Delice Lesch, MD;  Location: [facility];  Service: Endoscopy;  Laterality: Bilateral;  . Esophagogastroduodenoscopy N/A 06/13/2014    Procedure: ESOPHAGOGASTRODUODENOSCOPY (EGD);  Surgeon: Irene Shipper, MD;  Location: [facility];  Service: Endoscopy;  Laterality: N/A;  . Esophageal banding N/A 06/13/2014    Procedure: ESOPHAGEAL BANDING;  Surgeon: Irene Shipper, MD;  Location: [facility];  Service: Endoscopy;  Laterality: N/A;    Assessment & Plan Clinical Impression: [name] is a 64 year old male with history of hepatic cirrhosis (with portal HTN/ascites/variceal bleed/thrombocytopenia) due to NASH, DM type 2 with neuropathy; who was has had neck pain with malaise for the past 2 weeks. He had blood cultures drawn by MD which was positive for gram-positive cocci. He was instructed to go to [facility] ED on 10/21/14 due to concerns of spine infection and was transferred to [facility] for evaluation. MRI cervical spine revealed epidural abscess with enhancing phlegmonous change from C1-C 6-7 with moderate to severe diffuse canal stenosis with flattening and displacement of cord, multifocal abscesses involving posterior paraspinous musculature predominantly on the left and abnormal dural enhancement extending superiorly through the foramen magnum. CT head negative for acute changes. Patient somnolent and encephalopathic with left sided neglect and quadriparesis at admission. Dr. Saintclair Halsted consulted for input and recommended management with IV antibiotics as extremely high surgical risk due to multiple comorbidities as well as coagulopathy.   Dr. Tommy Medal consulted and recommended adding IV ceftriaxone to IV Vancomycin for CNS penetration. CRP- 27.4 and sed rate 62. 2D echo with EF 65-70%, mild to moderate left atrial enlargement and mild aortic sclerosis without stenosis. TEE not done due to inability to manipulate neck. Repeat cultures positive for Group B streptococci and patient to continue on IV ceftriaxone 2 grams bid while hospitalized and change to 2 grams daily at discharge thorough June 9th for 8 weeks antibiotic therapy. Will need repeat MRI spine at 6-8 weeks with follow up with ID in 4  weeks. Mentation improving with decrease in left inattention and improvement in LLE weakness. Therapy initiated and patient limited by unsteady gait, fatigue and diffuse weakness with poor coordination. Patient transferred to [facility] on 10/25/2014 .    Patient currently requires min-mod assist with basic self-care skills and functional mobility secondary to muscle weakness, decreased cardiorespiratoy endurance, decreased problem solving and decreased safety awareness and decreased standing balance, decreased postural control, hemiplegia and decreased balance strategies.  Prior to hospitalization, patient could complete BADLs with independent .  Patient will benefit from skilled intervention to increase independence with basic self-care skills prior to discharge home with care partner.  Anticipate patient will require intermittent supervision and follow up home health.  OT - End of Session Activity Tolerance: Decreased this session Endurance Deficit: Yes Endurance Deficit Description: frequent rest breaks OT Assessment Rehab Potential (ACUTE ONLY): Good OT Patient demonstrates impairments in the following area(s): Balance;Cognition;Safety;Pain;Edema;Endurance;Motor OT Basic ADL's Functional Problem(s): Grooming;Bathing;Dressing;Toileting OT Transfers Functional Problem(s): Toilet;Tub/Shower OT Additional Impairment(s): Fuctional Use of Upper Extremity (LUE) OT Plan OT Intensity: Minimum of 1-2 x/day, 45 to 90 minutes OT Frequency: 5 out of 7 days OT Duration/Estimated Length of Stay: 10-12 days  OT Treatment/Interventions: Balance/vestibular training;Cognitive remediation/compensation;Discharge planning;Community reintegration;DME/adaptive equipment instruction;Functional mobility training;Neuromuscular re-education;Pain management;Psychosocial support;Patient/family education;Self Care/advanced ADL retraining;Therapeutic Activities;Therapeutic Exercise;UE/LE Strength taining/ROM;UE/LE Coordination  activities OT Self Feeding Anticipated Outcome(s): n/a OT Basic Self-Care Anticipated Outcome(s): supervision-mod I OT Toileting Anticipated Outcome(s): mod I OT Bathroom Transfers Anticipated Outcome(s): supervision shower transfer and mod I toileting  OT Recommendation Patient destination: Home Follow Up Recommendations: Home health OT Equipment Recommended: To be determined   Skilled Therapeutic Intervention OT eval completed. Discussed role of OT, goals of therapy, possible ELOS, follow-up, DME, and safety plan. Pt seen for ADL retraining with focus on functional mobility, postural control, activity tolerance, and functional use of LUE. Pt received supine in bed completing supine>sit with min A and min cues for sequencing. Completed stand pivot transfer bed>w/c with min A using RW. Completed bathing sit<>stand at sink with pt using L hand to grossly assist with wringing out wash cloth. Pt required mod cues to initiate use of LUE at gross assist level during self care tasks as pt stating multiple times "it doesn't work." Pt stood to urinate in urinal with min A for balance and manual facilitation for postural control secondary to lateral lean. Pt with no clothing therefore donned gown. Pt ambulated approx 8 feet w/c>bed at min A level with use of RW. Pt completed sit>supine with mod A and left supine in bed with RN present.   Session 2: Pt seen for 1:1 OT session with focus on functional transfers, standing balance, and activity tolerance. Pt received supine in bed requesting to toilet. Pt completed supine>sit with min A then stand pivot transfer bed>BSC with min A using RW. Pt urinated into urinal in standing with min A for balance and total A to manage urinal. Pt required total A for hygiene following BM with min A standing balance. Pt returned to bed at min A level and left supine in bed with all needs in reach.   OT Evaluation Precautions/Restrictions  Precautions Precautions:  Fall Restrictions Weight Bearing Restrictions: No General   Vital Signs Therapy Vitals Temp: 98.6 F (37 C) Temp Source: Oral Pulse Rate: (!) 54 Resp: 16 BP: (!) 138/55 mmHg Oxygen Therapy SpO2: 93 % O2 Device: Not Delivered Pain Pain Assessment Pain Assessment: 0-10 Pain Score: 8  Pain Type: Acute pain Pain Location: Neck Pain Orientation: Posterior Home Living/Prior Functioning Home Living Family/patient expects to be discharged to:: Private residence Living Arrangements: Spouse/significant other Available Help at Discharge: Family Type of Home: House Home Access: Stairs to enter Technical brewer of Steps: 2 Entrance Stairs-Rails: None Home Layout: Multi-level, Able to live on main level with bedroom/bathroom Alternate Level Stairs-Number of Steps: flight Alternate Level Stairs-Rails: Right, Left Additional Comments: 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 Prior Function Level of Independence: Independent with basic ADLs, Independent with gait, Independent with transfers Driving: Yes Vocation: Full time employment Vocation Requirements: works as Administrator for [employer] Leisure: Hobbies-yes (Comment) Comments: restore cars, lawn work ADL   Vision/Perception  Vision- History Baseline Vision/History: Wears glasses Wears Glasses: At all times Patient Visual Report: No change from baseline Vision- Assessment Vision Assessment?: No apparent visual deficits  Cognition Overall Cognitive Status: Impaired/Different from baseline Arousal/Alertness: Awake/alert Orientation Level: Oriented X4 Attention: Sustained Sustained Attention: Appears intact Memory: Impaired Memory Impairment: Decreased recall of new information;Other (comment);Storage deficit (working memory) Awareness: Appears intact Problem Solving: Impaired Problem Solving Impairment: Investment banker, corporate Function: Organizing;Self Correcting Organizing:  Impaired Organizing Impairment: Functional basic Self Correcting: Impaired Self Correcting Impairment: Functional basic Safety/Judgment: Appears intact Sensation Sensation Light Touch: Impaired Detail Light Touch Impaired Details: Impaired LUE Hot/Cold: Appears Intact Proprioception: Appears Intact Coordination Gross Motor Movements are Fluid and Coordinated: No Fine Motor Movements are Fluid and Coordinated: No Finger Nose Finger Test: unable to complete with LUE Motor  Motor Motor:  Abnormal postural alignment and control;Hemiplegia;Abnormal tone Mobility  Bed Mobility Bed Mobility: Supine to Sit;Sit to Supine Supine to Sit: 4: Min assist Supine to Sit Details: Manual facilitation for placement;Verbal cues for sequencing Sit to Supine: 3: Mod assist Sit to Supine - Details: Manual facilitation for placement;Verbal cues for sequencing Transfers Transfers: Sit to Stand;Stand to Sit Sit to Stand: 4: Min assist Sit to Stand Details: Tactile cues for weight shifting;Manual facilitation for weight shifting;Verbal cues for sequencing Stand to Sit: 4: Min assist Stand to Sit Details (indicate cue type and reason): Tactile cues for weight shifting;Manual facilitation for weight shifting;Verbal cues for sequencing  Trunk/Postural Assessment  Cervical Assessment Cervical Assessment: Exceptions to [facility] (limited by pain) Thoracic Assessment Thoracic Assessment: Within Functional Limits Lumbar Assessment Lumbar Assessment: Exceptions to [facility] Postural Control Postural Control: Deficits on evaluation  Balance Balance Balance Assessed: Yes Dynamic Sitting Balance Dynamic Sitting - Balance Support: Feet supported;Right upper extremity supported Dynamic Sitting - Level of Assistance: 5: Stand by assistance Static Standing Balance Static Standing - Balance Support: During functional activity;Bilateral upper extremity supported Static Standing - Level of Assistance: 4: Min assist;5: Stand  by assistance Dynamic Standing Balance Dynamic Standing - Balance Support: During functional activity Dynamic Standing - Level of Assistance: 4: Min assist Extremity/Trunk Assessment RUE Assessment RUE Assessment: Within Functional Limits LUE Assessment LUE Assessment: Exceptions to WFL LUE AROM (degrees) LUE Overall AROM Comments: shoulder flexion grossly 45 degress, elbow flexion grossly 110 degrees, elbow extension WFL LUE Strength LUE Overall Strength: Deficits LUE Overall Strength Comments: PROM WFL's; shoulder flexion grossly 2/5; elbow flexion 3/5; elbow extension 3-/5; grip fair  FIM:  FIM - Eating Eating Activity: 6: Modified consistency diet: (comment);5: Set-up assist for open containers (clear liquiids) FIM - Grooming Grooming Steps: Wash, rinse, dry face;Wash, rinse, dry hands Grooming: 5: Set-up assist to obtain items FIM - Bathing Bathing Steps Patient Completed: Chest;Abdomen;Front perineal area;Buttocks;Right upper leg;Left upper leg Bathing: 3: Mod-Patient completes 5-7 [id number] parts or 50-74% FIM - Upper Body Dressing/Undressing Upper body dressing/undressing: 0: Wears gown/pajamas-no public clothing FIM - Lower Body Dressing/Undressing Lower body dressing/undressing: 1: Total-Patient completed less than 25% of tasks (donning socks only) FIM - BControl and instrumentation engineerDevices: Walker;Bed rails;Arm rests Bed/Chair Transfer: 4: Supine > Sit: Min A (steadying Pt. > 75%/lift 1 leg);3: Sit > Supine: Mod A (lifting assist/Pt. 50-74%/lift 2 legs);4: Bed > Chair or W/C: Min A (steadying Pt. > 75%);4: Chair or W/C > Bed: Min A (steadying Pt. > 75%)   Refer to Care Plan for Long Term Goals  Recommendations for other services: None  Discharge Criteria: Patient will be discharged from OT if patient refuses treatment 3 consecutive times without medical reason, if treatment goals not met, if there is a change in medical status, if patient makes no  progress towards goals or if patient is discharged from hospital.  The above assessment, treatment plan, treatment alternatives and goals were discussed and mutually agreed upon: by patient and by family  PDuayne Cal4/21/2016, 8:08 AM

## 2014-10-26 NOTE — Progress Notes (Signed)
Retta Diones, RN Rehab Admission Coordinator Signed Physical Medicine and Rehabilitation PMR Pre-admission 10/24/2014 4:22 PM  Related encounter: Admission (Discharged) from 10/20/2014 in [place] All Collapse All   PMR Admission Coordinator Pre-Admission Assessment  Patient: [name] is an 64 y.o., male MRN: [id number] DOB: May 11, 1951 Height: 5\' 11"  (180.3 cm) (per pt wife) Weight: 90.9 kg (200 lb 6.4 oz)  Insurance Information HMO: PPO: PCP: IPA: 80/20: OTHER: Group # [id number] PRIMARY: [employer] Managed Policy#: [id number] Subscriber: [name] CM Name: [name] Phone#: [phone] Fax#: [phone] Pre-Cert#: [id number] with precert X 1 week Employer: [name] FT truck driver Benefits: Phone #: [phone] Name: [name]. Date: 07/07/14 Deduct: $1500 (met) Out of Pocket Max: [phone] (Met [phone]) Life Max: Unlimited CIR: 80% w/auth SNF: 80% w/auth and 100 days max Outpatient: 80% Co-Pay: 20% Home Health: 80% with 60 days max Co-Pay: 20% DME: 80% Co-Pay: 20% Providers: in Therapist, art Information    Name Relation Home Work Mobile   [name] Spouse [phone]  [phone]     Current Medical History  Patient Admitting Diagnosis: Cervical epidural abscess  History of Present Illness: A 64 y.o. right handed male with history of diabetes mellitus peripheral neuropathy, cirrhosis and portal hypertension. Independent prior to admission living with his wife. Presented 10/20/2014 with neck pain 2 weeks and low-grade fever. Blood cultures recently by PCP grew out gram-positive cocci. Patient presented ED at [facility]  concern of infection and transferred to [facility] for further evaluation. MRI cervical spine showed epidural abscess with enhancing phlegmonous changes extending from the level of C1 inferiorly to approximately the level CVI and 7.. No evidence of osteomyelitis discitis. Follow-up neurosurgery Dr. Saintclair Halsted with conservative care. Concerns of question left-sided neglect with CT of the head 10/21/2014 negative for acute changes. Infectious disease consulted follow-up group B streptococcal bacteremia and maintain on high doses of Rocephin. Echocardiogram with sclerotic aortic valve and ejection fraction of 70%. Hospital course pain management. Physical therapy evaluation completed 10/23/2014 with recommendations of physical medicine rehabilitation consult.   Past Medical History  Past Medical History  Diagnosis Date  . Reflux esophagitis   . Portal hypertension   . Stricture and stenosis of esophagus   . Diverticulosis of colon (without mention of hemorrhage)   . Internal hemorrhoids without mention of complication   . Benign neoplasm of colon   . Duodenitis without mention of hemorrhage   . 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 Personal history of diseases of blood and blood-forming organs   . Cirrhosis of liver without mention of alcohol   . Unspecified essential hypertension   . Obesity, unspecified   . Type II or unspecified type diabetes mellitus without mention of complication, not stated as uncontrolled   . GERD (gastroesophageal reflux disease)   . Hemorrhoids   . Colon polyps   . Pancytopenia   . Calculus of kidney yrs ago    Family History  family history includes Aneurysm in his mother; Diabetes in his father and another family member; Heart disease in his father and other. There is no history of Colon cancer, Anesthesia problems, Hypotension, Malignant hyperthermia, or Pseudochol deficiency.  Prior  Rehab/Hospitalizations: Had outpatient therapy in [place] after he pulled a muscle.  Current Medications   Current facility-administered medications:  . 0.9 % sodium chloride infusion, 250 mL, Intravenous, PRN, Jonathan Gravel, MD . acetaminophen (TYLENOL) tablet 650 mg,  650 mg, Oral, Q6H PRN, Jonathan Redwood, MD . bisacodyl (DULCOLAX) suppository 10 mg, 10 mg, Rectal, Daily PRN, Jonathan Girt, DO, 10 mg at 10/24/14 1730 . cefTRIAXone (ROCEPHIN) 2 g in dextrose 5 % 50 mL IVPB - Premix, 2 g, Intravenous, Q12H, Jonathan Headings, MD, 2 g at 10/24/14 2104 . feeding supplement (GLUCERNA SHAKE) (GLUCERNA SHAKE) liquid 237 mL, 237 mL, Oral, BID BM, [name], RD, 237 mL at 10/24/14 1334 . fentaNYL (DURAGESIC - dosed mcg/hr) 12.5 mcg, 12.5 mcg, Transdermal, Q72H, Jonathan Girt, DO, 12.5 mcg at 10/24/14 0917 . HYDROcodone-acetaminophen (NORCO/VICODIN) 5-325 MG per tablet 1-2 tablet, 1-2 tablet, Oral, Q6H PRN, Jonathan Girt, DO, 2 tablet at 10/24/14 2337 . insulin aspart (novoLOG) injection 0-5 Units, 0-5 Units, Subcutaneous, QHS, Jonathan Gravel, MD, 0 Units at 10/20/14 2315 . insulin aspart (novoLOG) injection 0-9 Units, 0-9 Units, Subcutaneous, TID WC, Jonathan Gravel, MD, 1 Units at 10/25/14 [phone] . morphine 2 MG/ML injection 1 mg, 1 mg, Intravenous, Q3H PRN, Jonathan Redwood, MD, 1 mg at 10/25/14 0139 . pantoprazole (PROTONIX) EC tablet 40 mg, 40 mg, Oral, q morning - 10a, Jonathan Gravel, MD, 40 mg at 10/24/14 0916 . polyethylene glycol (MIRALAX / GLYCOLAX) packet 17 g, 17 g, Oral, Daily, Jonathan U Vann, DO, 17 g at 10/24/14 1250 . propranolol (INDERAL) tablet 20 mg, 20 mg, Oral, q morning - 10a, Jonathan Gravel, MD, 20 mg at 10/24/14 0916 . senna-docusate (Senokot-S) tablet 1 tablet, 1 tablet, Oral, BID, Jonathan Girt, DO, 1 tablet at 10/24/14 2104 . sodium chloride 0.9 % injection 10-40 mL, 10-40 mL, Intracatheter, PRN, Jonathan Kos, MD, 10 mL at 10/24/14 2025 . sodium chloride 0.9 %  injection 3 mL, 3 mL, Intravenous, Q12H, Jonathan Gravel, MD, 3 mL at 10/24/14 1250 . sodium chloride 0.9 % injection 3 mL, 3 mL, Intravenous, Q12H, Jonathan Gravel, MD, 3 mL at 10/24/14 0941 . sodium chloride 0.9 % injection 3 mL, 3 mL, Intravenous, PRN, Jonathan Gravel, MD . sodium phosphate (FLEET) 7-19 GM/118ML enema 1 enema, 1 enema, Rectal, Once, Jonathan Girt, DO . 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 Went out daily.   Home Assistive Devices / Equipment Home Assistive Devices/Equipment: None Home Equipment: None  Prior Functional Level Prior Function Level of Independence: Independent  Current Functional Level Cognition  Overall Cognitive Status: Within Functional Limits for tasks assessed Orientation Level: Oriented to person, Oriented to situation, Disoriented to time, Oriented to place   Extremity Assessment (includes Sensation/Coordination)  Upper Extremity Assessment: RUE deficits/detail, LUE deficits/detail RUE Deficits / Details: Supine: Grossly 3/5 shoulder, rest 4/5 RUE Coordination: decreased fine motor, decreased gross motor LUE Deficits / Details: Supine: Grossly 3/5 shoulder (with a slight drift into abduction as shoulder reaches 90 degrees), 3+/5 rest LUE Coordination: decreased fine motor, decreased gross motor  Lower Extremity Assessment: RLE deficits/detail, LLE deficits/detail RLE Deficits / Details: functional, but proximal weakness and lower trunk LLE Deficits / Details: notably weaker at 3+/5, quick fatiguability LLE Coordination: decreased fine motor     ADLs  Overall ADL's : Needs assistance/impaired Eating/Feeding: Set up, Sitting Grooming: Minimal assistance, Sitting Upper Body Bathing: Set up, Sitting  Lower Body Bathing: Moderate assistance (with min A sit<>stand) Upper Body Dressing : Moderate assistance, Sitting Lower Body Dressing: Moderate assistance (with min A sit<>stand) Toilet Transfer: Minimal assistance, Ambulation, RW, Regular Toilet, Grab bars Toileting- Clothing Manipulation and Hygiene: Minimal assistance (with min A sit<>stand)    Mobility  Overal bed mobility: Needs Assistance Bed Mobility: Rolling, Sidelying to Sit Rolling: Min assist Sidelying to sit: Min assist General bed mobility comments: cues for direction    Transfers  Overall transfer level: Needs assistance Transfers: Sit to/from Stand Sit to Stand: Min guard General transfer comment: guard for safety, some stabilization against the bed    Ambulation / Gait / Stairs / Wheelchair Mobility  Ambulation/Gait Ambulation/Gait assistance: Museum/gallery curator (Feet): 110 Feet Assistive device: Rolling walker (2 wheeled), None Gait Pattern/deviations: Step-through pattern, Trunk flexed Gait velocity: slower General Gait Details: mildly unsteady gait L LE worse than R, worsening with fatigue, some foot slap and visible incoordination L LE    Posture / Balance Balance Overall balance assessment: Needs assistance Sitting-balance support: No upper extremity supported Sitting balance-Leahy Scale: Fair Standing balance support: No upper extremity supported Standing balance-Leahy Scale: Fair    Special needs/care consideration BiPAP/CPAP No CPM No Continuous Drip IV No Dialysis No  Life Vest No Oxygen No Special Bed No Trach Size No Wound Vac (area) No  Skin No  Bowel mgmt: Last BM 10/21/14 Bladder mgmt: Voiding in urinal WDL Diabetic mgmt Yes, on oral medications at home.     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 Does the patient have any problems obtaining your medications?: No  Social/Family/Support Systems Patient Roles: Spouse, Parent (Has a wife, a son and a daughter.) Contact Information: Hjalmer Iovino - wife [phone] Anticipated Caregiver: wife Ability/Limitations of Caregiver: Wife is not working and can provide supervision at home Caregiver Availability: 24/7 Discharge Plan Discussed with Primary Caregiver: Yes Is Caregiver In Agreement with Plan?: Yes Does Caregiver/Family have Issues with Lodging/Transportation while Pt is in Rehab?: No  Goals/Additional Needs Patient/Family Goal for Rehab: PT/OT/ST mod I and supervision goals Expected length of stay: 7 days Cultural Considerations: None Dietary Needs: Heart Healthy, carb mod, thin liquids Equipment Needs: TBD Pt/Family Agrees to Admission and willing to participate: Yes Program Orientation Provided & Reviewed with Pt/Caregiver  Including Roles & Responsibilities: Yes  Decrease burden of Care through IP rehab admission: N/A  Possible need for SNF placement upon discharge: Not anticipated  Patient Condition: This patient's condition remains as documented in the consult dated 10/23/14, in which the Rehabilitation Physician determined and documented that the patient's condition is appropriate for intensive rehabilitative care in an inpatient rehabilitation facility. Will admit to inpatient rehab today.  Preadmission Screen Completed By:  Retta Diones, 10/25/2014 9:59 AM ______________________________________________________________________  Discussed status with Dr. Naaman Plummer on 10/25/14 at 37 and received telephone approval for admission today.  Admission Coordinator: Retta Diones, time1000/Date04/20/16          Cosigned by: Meredith Staggers, MD at 10/25/2014 10:09 AM  Revision History     Date/Time User Provider Type Action   10/25/2014 10:09 AM Meredith Staggers, MD Physician Cosign   10/25/2014 10:00 AM Retta Diones, RN Rehab Admission Coordinator Sign

## 2014-10-26 NOTE — Progress Notes (Signed)
Meredith Staggers, MD Physician Signed Physical Medicine and Rehabilitation Consult Note 10/23/2014 2:26 PM  Related encounter: Admission (Discharged) from 10/20/2014 in [place] [facility]        Physical Medicine and Rehabilitation Consult Reason for Consult: Cervical epidural abscess Referring Physician: [facility]   HPI: [name] is a 64 y.o. right handed male with history of diabetes mellitus peripheral neuropathy, cirrhosis and portal hypertension. Independent prior to admission living with his wife. Presented 10/20/2014 with neck pain 2 weeks and low-grade fever. Blood cultures recently by PCP grew out gram-positive cocci. Patient presented ED at [facility] concern of infection and transferred to [facility] for further evaluation. MRI cervical spine showed epidural abscess with enhancing phlegmonous changes extending from the level of C1 inferiorly to approximately the level CVI and 7.. No evidence of osteomyelitis discitis. Follow-up neurosurgery Dr. Saintclair Halsted with conservative care. Concerns of question left-sided neglect with CT of the head 10/21/2014 negative for acute changes. Infectious disease consulted follow-up group B streptococcal bacteremia and maintain on high doses of Rocephin. Echocardiogram with sclerotic aortic valve and ejection fraction of 70%. Hospital course pain management. Physical therapy evaluation completed 10/23/2014 with recommendations of physical medicine rehabilitation consult.   Review of Systems  Constitutional: Positive for fever.  Gastrointestinal:   GERD  Musculoskeletal: Positive for myalgias and neck pain.  All other systems reviewed and are negative.  Past Medical History  Diagnosis Date  . Reflux esophagitis   . Portal hypertension   . Stricture and stenosis of esophagus   . Diverticulosis of colon (without mention of hemorrhage)   . Internal hemorrhoids  without mention of complication   . Benign neoplasm of colon   . Duodenitis without mention of hemorrhage   . 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 Personal history of diseases of blood and blood-forming organs   . Cirrhosis of liver without mention of alcohol   . Unspecified essential hypertension   . Obesity, unspecified   . Type II or unspecified type diabetes mellitus without mention of complication, not stated as uncontrolled   . GERD (gastroesophageal reflux disease)   . Hemorrhoids   . Colon polyps   . Pancytopenia   . Calculus of kidney yrs ago   Past Surgical History  Procedure Laterality Date  . Kidney stone surgery  yrs ago  . Esophagogastroduodenoscopy  08/01/2011    Procedure: ESOPHAGOGASTRODUODENOSCOPY (EGD); Surgeon: Scarlette Shorts, MD; Location: [facility]; Service: Endoscopy; Laterality: N/A;  . Esophagogastroduodenoscopy N/A 10/27/2012    Procedure: ESOPHAGOGASTRODUODENOSCOPY (EGD); Surgeon: Inda Castle, MD; Location: [facility]; Service: Endoscopy; Laterality: N/A;  . Esophagogastroduodenoscopy N/A 11/22/2012    Procedure: ESOPHAGOGASTRODUODENOSCOPY (EGD); Surgeon: Irene Shipper, MD; Location: [facility]; Service: Endoscopy; Laterality: N/A;  . Colonoscopy    . Upper gastrointestinal endoscopy    . Esophagogastroduodenoscopy N/A 11/29/2013    Procedure: ESOPHAGOGASTRODUODENOSCOPY (EGD); Surgeon: Irene Shipper, MD; Location: [facility]; Service: Endoscopy; Laterality: N/A;  . Esophageal banding Bilateral 11/29/2013    Procedure: ESOPHAGEAL BANDING; Surgeon: Irene Shipper, MD; Location: [facility]; Service: Endoscopy; Laterality: Bilateral;  . Esophagogastroduodenoscopy N/A 06/13/2014    Procedure: ESOPHAGOGASTRODUODENOSCOPY (EGD); Surgeon: Irene Shipper, MD; Location: [facility]; Service: Endoscopy; Laterality: N/A;  . Esophageal  banding N/A 06/13/2014    Procedure: ESOPHAGEAL BANDING; Surgeon: Irene Shipper, MD; Location: [facility]; Service: Endoscopy; Laterality: N/A;   Family History  Problem Relation Age of Onset  .  Colon cancer Neg Hx   . Anesthesia problems Neg Hx   . Hypotension Neg Hx   . Malignant hyperthermia Neg Hx   . Pseudochol deficiency Neg Hx   . Diabetes Father   . Heart disease Father   . Aneurysm Mother   . Heart disease Other     neice  . Diabetes      niece   Social History:  reports that he has never smoked. He has never used smokeless tobacco. He reports that he does not drink alcohol or use illicit drugs. Allergies: No Known Allergies Medications Prior to Admission  Medication Sig Dispense Refill  . canagliflozin (INVOKANA) 100 MG TABS tablet Take 100 mg by mouth daily.    . cetirizine (ZYRTEC) 10 MG tablet Take 10 mg by mouth daily as needed for allergies.     Marland Kitchen doxycycline (VIBRA-TABS) 100 MG tablet Take 100 mg by mouth 2 (two) times daily.    . Dulaglutide 0.75 MG/0.5ML SOPN Inject 0.75 mg into the skin once a week.    . gabapentin (NEURONTIN) 100 MG capsule Take 100 mg by mouth daily.     Marland Kitchen JANUVIA 100 MG tablet Take 100 mg by mouth daily.    Marland Kitchen linagliptin (TRADJENTA) 5 MG TABS tablet Take 5 mg by mouth every morning.     Marland Kitchen LIVER EXTRACT PO Take by mouth daily.    . Magnesium 250 MG TABS Take 1 tablet by mouth every morning.     . milk thistle 175 MG tablet Take 175 mg by mouth every morning.     . pantoprazole (PROTONIX) 40 MG tablet Take 1 tablet (40 mg total) by mouth daily. (Patient taking differently: Take 40 mg by mouth every morning. ) 30 tablet 6  . Potassium 99 MG TABS Take 1 tablet by mouth every morning.     . propranolol (INDERAL) 20 MG tablet TAKE 1 TABLET (20 MG TOTAL) BY MOUTH DAILY. 30 tablet 9  . INVOKANA 300 MG TABS tablet Take 300  mg by mouth daily.    . Vitamin D, Ergocalciferol, (DRISDOL) 50000 UNITS CAPS Take 50,000 Units by mouth every 7 (seven) days.       Home: Home Living Family/patient expects to be discharged to:: Private residence Living Arrangements: Spouse/significant other Available Help at Discharge: Family Type of Home: House Home Access: Stairs to enter Technical brewer of Steps: 2 Entrance Stairs-Rails: None Home Layout: Multi-level Alternate Level Stairs-Number of Steps: flights Alternate Level Stairs-Rails: Right, Left Home Equipment: None Additional Comments: Works as a Administrator for [employer]: Prior Function Level of Independence: Independent Functional Status:  Mobility: Bed Mobility Overal bed mobility: Needs Assistance Bed Mobility: Rolling, Sidelying to Sit Rolling: Min assist Sidelying to sit: [place] bed mobility comments: cues for direction Transfers Overall transfer level: Needs assistance Transfers: Sit to/from Stand Sit to Stand: Min guard General transfer comment: guard for safety, some stabilization against the bed Ambulation/Gait Ambulation/Gait assistance: Min assist Ambulation Distance (Feet): 110 Feet Assistive device: Rolling walker (2 wheeled), None Gait Pattern/deviations: Step-through pattern, Trunk flexed Gait velocity: slower General Gait Details: mildly unsteady gait L LE worse than R, worsening with fatigue, some foot slap and visible incoordination L LE    ADL: ADL Overall ADL's : Needs assistance/impaired Eating/Feeding: Set up, Sitting Grooming: Minimal assistance, Sitting Upper Body Bathing: Set up, Sitting Lower Body Bathing: Moderate assistance (with min A sit<>stand) Upper Body Dressing : Moderate assistance, Sitting Lower Body Dressing: Moderate  assistance (with min A sit<>stand) Toilet Transfer: Minimal assistance, Ambulation, RW, Regular Toilet, Grab bars Toileting- Clothing  Manipulation and Hygiene: Minimal assistance (with min A sit<>stand)  Cognition: Cognition Overall Cognitive Status: Within Functional Limits for tasks assessed Orientation Level: Oriented to person, Oriented to situation, Disoriented to time, Oriented to place Cognition Arousal/Alertness: Awake/alert Behavior During Therapy: [facility] for tasks assessed/performed Overall Cognitive Status: Within Functional Limits for tasks assessed  Blood pressure 132/63, pulse 60, temperature 98 F (36.7 C), temperature source Oral, resp. rate 18, height 5\' 11"  (1.803 m), weight 90.9 kg (200 lb 6.4 oz), SpO2 96 %. Physical Exam  Vitals reviewed. Constitutional: He is oriented to person, place, and time. He appears well-developed.  HENT:  Head: Normocephalic.  Eyes: EOM are normal.  Neck: Normal range of motion. Neck supple. No thyromegaly present.  Cardiovascular: Normal rate and regular rhythm.  Respiratory: Effort normal and breath sounds normal. No respiratory distress.  GI: Soft. Bowel sounds are normal. He exhibits no distension.  Neurological: He is alert and oriented to person, place, and time.  Voice is weak but intelligible.  Skin: Skin is warm and dry.     Lab Results Last 24 Hours    Results for orders placed or performed during the hospital encounter of 10/20/14 (from the past 24 hour(s))  Glucose, capillary Status: Abnormal   Collection Time: 10/22/14 5:39 PM  Result Value Ref Range   Glucose-Capillary 182 (H) 70 - 99 mg/dL  Glucose, capillary Status: Abnormal   Collection Time: 10/22/14 10:17 PM  Result Value Ref Range   Glucose-Capillary 146 (H) 70 - 99 mg/dL  Basic metabolic panel Status: Abnormal   Collection Time: 10/23/14 7:13 AM  Result Value Ref Range   Sodium 132 (L) 135 - 145 mmol/L   Potassium 3.7 3.5 - 5.1 mmol/L   Chloride 98 96 - 112 mmol/L   CO2 22 19 - 32 mmol/L   Glucose, Bld 125 (H) 70 - 99 mg/dL     BUN 17 6 - 23 mg/dL   Creatinine, Ser 0.70 0.50 - 1.35 mg/dL   Calcium 8.1 (L) 8.4 - 10.5 mg/dL   GFR calc non Af Amer >90 >90 mL/min   GFR calc Af Amer >90 >90 mL/min   Anion gap 12 5 - 15  CBC Status: Abnormal   Collection Time: 10/23/14 7:13 AM  Result Value Ref Range   WBC 12.4 (H) 4.0 - 10.5 K/uL   RBC 3.88 (L) 4.22 - 5.81 MIL/uL   Hemoglobin 11.7 (L) 13.0 - 17.0 g/dL   HCT 34.3 (L) 39.0 - 52.0 %   MCV 88.4 78.0 - 100.0 fL   MCH 30.2 26.0 - 34.0 pg   MCHC 34.1 30.0 - 36.0 g/dL   RDW 15.3 11.5 - 15.5 %   Platelets 26 (LL) 150 - 400 K/uL  Glucose, capillary Status: Abnormal   Collection Time: 10/23/14 7:44 AM  Result Value Ref Range   Glucose-Capillary 114 (H) 70 - 99 mg/dL  Glucose, capillary Status: Abnormal   Collection Time: 10/23/14 11:43 AM  Result Value Ref Range   Glucose-Capillary 184 (H) 70 - 99 mg/dL  Urinalysis, Routine w reflex microscopic Status: Abnormal   Collection Time: 10/23/14 1:25 PM  Result Value Ref Range   Color, Urine AMBER (A) YELLOW   APPearance CLEAR CLEAR   Specific Gravity, Urine 1.035 (H) 1.005 - 1.030   pH 6.0 5.0 - 8.0   Glucose, UA >1000 (A) NEGATIVE mg/dL   Hgb urine dipstick  NEGATIVE NEGATIVE   Bilirubin Urine NEGATIVE NEGATIVE   Ketones, ur 40 (A) NEGATIVE mg/dL   Protein, ur NEGATIVE NEGATIVE mg/dL   Urobilinogen, UA 1.0 0.0 - 1.0 mg/dL   Nitrite NEGATIVE NEGATIVE   Leukocytes, UA NEGATIVE NEGATIVE  Urine microscopic-add on Status: None   Collection Time: 10/23/14 1:25 PM  Result Value Ref Range   Squamous Epithelial / LPF RARE RARE   WBC, UA 0-2 <3 WBC/hpf   RBC / HPF 3-6 <3 RBC/hpf   Bacteria, UA RARE RARE      Imaging Results (Last 48 hours)    No results found.    Assessment/Plan: Diagnosis: cervical epidural abscess 1. Does the  need for close, 24 hr/day medical supervision in concert with the patient's rehab needs make it unreasonable for this patient to be served in a less intensive setting? Potentially 2. Co-Morbidities requiring supervision/potential complications: encephalopathy 3. Due to bladder management, bowel management, safety, skin/wound care, disease management, medication administration, pain management and patient education, does the patient require 24 hr/day rehab nursing? Yes 4. Does the patient require coordinated care of a physician, rehab nurse, PT (1-2 hrs/day, 5 days/week), OT (1-2 hrs/day, 5 days/week) and SLP (1-2 hrs/day, 5 days/week) to address physical and functional deficits in the context of the above medical diagnosis(es)? Yes Addressing deficits in the following areas: balance, endurance, locomotion, strength, transferring, bowel/bladder control, bathing, dressing, feeding, grooming, toileting, cognition and psychosocial support 5. Can the patient actively participate in an intensive therapy program of at least 3 hrs of therapy per day at least 5 days per week? Yes 6. The potential for patient to make measurable gains while on inpatient rehab is excellent 7. Anticipated functional outcomes upon discharge from inpatient rehab are modified independent and supervision with PT, modified independent and supervision with OT, modified independent and supervision with SLP. 8. Estimated rehab length of stay to reach the above functional goals is: 7 days 9. Does the patient have adequate social supports and living environment to accommodate these discharge functional goals? Yes 10. Anticipated D/C setting: Home 11. Anticipated post D/C treatments: HH therapy and Outpatient therapy 12. Overall Rehab/Functional Prognosis: excellent  RECOMMENDATIONS: This patient's condition is appropriate for continued rehabilitative care in the following setting: CIR Patient has agreed to participate in recommended  program. Yes Note that insurance prior authorization may be required for reimbursement for recommended care.  Comment: Rehab Admissions Coordinator to follow up.  Thanks,  Meredith Staggers, MD, [facility]     10/23/2014       Revision History     Date/Time User Provider Type Action   10/23/2014 3:32 PM Meredith Staggers, MD Physician Sign   10/23/2014 2:47 PM Cathlyn Parsons, PA-C Physician Assistant Pend   View Details Report       Routing History     Date/Time From To Method   10/23/2014 3:32 PM Meredith Staggers, MD Meredith Staggers, MD In Basket   10/23/2014 3:32 PM Meredith Staggers, MD [name], MD Fax

## 2014-10-27 ENCOUNTER — Inpatient Hospital Stay (HOSPITAL_COMMUNITY): Payer: Managed Care, Other (non HMO) | Admitting: Speech Pathology

## 2014-10-27 ENCOUNTER — Inpatient Hospital Stay (HOSPITAL_COMMUNITY): Payer: Managed Care, Other (non HMO)

## 2014-10-27 LAB — URINE MICROSCOPIC-ADD ON

## 2014-10-27 LAB — BASIC METABOLIC PANEL
ANION GAP: 7 (ref 5–15)
BUN: 12 mg/dL (ref 6–23)
CALCIUM: 8.1 mg/dL — AB (ref 8.4–10.5)
CO2: 27 mmol/L (ref 19–32)
Chloride: 96 mmol/L (ref 96–112)
Creatinine, Ser: 0.59 mg/dL (ref 0.50–1.35)
GFR calc Af Amer: >90 mL/min (ref >90)
GFR calc non Af Amer: >90 mL/min (ref >90)
GLUCOSE: 163 mg/dL — AB (ref 70–99)
Potassium: 4.1 mmol/L (ref 3.5–5.1)
Sodium: 130 mmol/L — ABNORMAL LOW (ref 135–145)

## 2014-10-27 LAB — GLUCOSE, CAPILLARY
GLUCOSE-CAPILLARY: 161 mg/dL — AB (ref 70–99)
GLUCOSE-CAPILLARY: 188 mg/dL — AB (ref 70–99)
Glucose-Capillary: 180 mg/dL — ABNORMAL HIGH (ref 70–99)
Glucose-Capillary: 175 mg/dL — ABNORMAL HIGH (ref 70–99)

## 2014-10-27 LAB — CULTURE, BLOOD (ROUTINE X 2)
CULTURE: NO GROWTH
CULTURE: NO GROWTH

## 2014-10-27 LAB — URINALYSIS, ROUTINE W REFLEX MICROSCOPIC
Glucose, UA: >1000 mg/dL — AB
KETONES UR: 15 mg/dL — AB
Leukocytes, UA: NEGATIVE
Nitrite: NEGATIVE
Protein, ur: NEGATIVE mg/dL
Specific Gravity, Urine: 1.042 — ABNORMAL HIGH (ref 1.005–1.030)
UROBILINOGEN UA: 1 mg/dL (ref 0.0–1.0)
pH: 6 (ref 5.0–8.0)

## 2014-10-27 MED ORDER — METOCLOPRAMIDE HCL 5 MG/ML IJ SOLN
5.0000 mg | Freq: Three times a day (TID) | INTRAMUSCULAR | Status: DC
  Filled 2014-10-27 (×2): qty 1

## 2014-10-27 MED ORDER — METOCLOPRAMIDE HCL 5 MG/ML IJ SOLN
5.0000 mg | Freq: Three times a day (TID) | INTRAMUSCULAR | Status: DC
  Administered 2014-10-27 – 2014-10-30 (×9): 5 mg via INTRAVENOUS
  Filled 2014-10-27 (×12): qty 1

## 2014-10-27 MED ORDER — LIDOCAINE 5 % EX PTCH
2.0000 | MEDICATED_PATCH | CUTANEOUS | Status: DC
  Administered 2014-10-28 – 2014-11-11 (×15): 2 via TRANSDERMAL
  Filled 2014-10-27 (×17): qty 2

## 2014-10-27 MED ORDER — SIMETHICONE 80 MG PO CHEW
160.0000 mg | CHEWABLE_TABLET | Freq: Four times a day (QID) | ORAL | Status: DC
  Administered 2014-10-27 – 2014-11-11 (×57): 160 mg via ORAL
  Filled 2014-10-27 (×64): qty 2

## 2014-10-27 MED ORDER — METOCLOPRAMIDE HCL 5 MG/ML IJ SOLN
5.0000 mg | Freq: Four times a day (QID) | INTRAMUSCULAR | Status: DC
  Filled 2014-10-27 (×3): qty 1

## 2014-10-27 NOTE — Progress Notes (Signed)
Nursing Note: Pt bladder scanned for 960 cc.Pt  Did not feel urge to void..Pt was in pain and was just medicated for pain.A: Pt in and out cathed for 500cc.Pt tolerated well.wbb

## 2014-10-27 NOTE — IPOC Note (Addendum)
Overall Plan of Care [facility]) Patient Details Name: [name] MRN: [id number] DOB: [birth year]  Admitting Diagnosis: Cervical epidural abscess   Hospital Problems: Principal Problem:   Abscess in epidural space of cervical spine Active Problems:   Encephalopathy acute   Left cervical radiculopathy   Cervical myelopathy   Ileus, postoperative     Functional Problem List: Nursing Bowel, Edema, Endurance, Medication Management, Motor, Pain, Safety  PT Balance, Endurance, Motor  OT Balance, Cognition, Safety, Pain, Edema, Endurance, Motor  SLP Cognition  TR Activity tolerance, functional mobility, balance, safety, pain       Basic ADL's: OT Grooming, Bathing, Dressing, Toileting     Advanced  ADL's: OT       Transfers: PT Bed Mobility, Bed to Chair, Car  OT Toilet, Tub/Shower     Locomotion: PT Ambulation, Emergency planning/management officer, Stairs     Additional Impairments: OT Fuctional Use of Upper Extremity (LUE)  SLP Social Cognition   Problem Solving, Memory  TR      Anticipated Outcomes Item Anticipated Outcome  Self Feeding n/a  Swallowing      Basic self-care  supervision-mod I  Toileting  mod I   Bathroom Transfers supervision shower transfer and mod I toileting  Bowel/Bladder  Mod I  Transfers  mod I basic transfers  Locomotion  supervision household ambualtion with min assist for 2 steps to access home  Communication     Cognition  supervision  Pain  < 4  Safety/Judgment  Mod I   Therapy Plan: PT Intensity: Minimum of 1-2 x/day ,45 to 90 minutes PT Frequency: 5 out of 7 days PT Duration Estimated Length of Stay: 10 - 12 days OT Intensity: Minimum of 1-2 x/day, 45 to 90 minutes OT Frequency: 5 out of 7 days OT Duration/Estimated Length of Stay: 10-12 days  SLP Intensity: Minumum of 1-2 x/day, 30 to 90 minutes SLP Frequency: 3 to 5 out of 7 days SLP Duration/Estimated Length of Stay: 9-13 days  TR Duration/ELOS:  2 weeks TR Frequency:  Min 1  time per week >20 minutes        Team Interventions: Nursing Interventions Patient/Family Education, Bowel Management, Disease Management/Prevention, Pain Management, Medication Management  PT interventions Ambulation/gait training, Balance/vestibular training, Discharge planning, Functional mobility training, Pain management, Patient/family education, Stair training, Therapeutic Activities, Therapeutic Exercise, UE/LE Strength taining/ROM, UE/LE Coordination activities, Wheelchair propulsion/positioning  OT Interventions Training and development officer, Cognitive remediation/compensation, Discharge planning, Community reintegration, DME/adaptive equipment instruction, Functional mobility training, Neuromuscular re-education, Pain management, Psychosocial support, Patient/family education, Self Care/advanced ADL retraining, Therapeutic Activities, Therapeutic Exercise, UE/LE Strength taining/ROM, UE/LE Coordination activities  SLP Interventions Cognitive remediation/compensation, Cueing hierarchy, Environmental controls, Functional tasks, Internal/external aids, Medication managment, Patient/family education  TR Interventions Recreation/leisure participation, Balance/Vestibular training, functional mobility, therapeutic activities, UE/LE strength/coordination, w/c mobility, community reintegration, pt/family education, adaptive equipment instruction/use, discharge planning, psychosocial support, compensatory strategies  SW/CM Interventions Psychosocial Support, Dentist, Patient/Family Education    Team Discharge Planning: Destination: PT-Home ,OT- Home , SLP-Home Projected Follow-up: PT-Home health PT, OT-  Home health OT, SLP-24 hour supervision/assistance, Home Health SLP Projected Equipment Needs: PT-Rolling walker with 5" wheels, Wheelchair (measurements), OT- To be determined, SLP-None recommended by SLP Equipment Details: PT- , OT-  Patient/family involved in discharge planning: PT-  Patient,  OT-Patient, Family member/caregiver, SLP-Patient  MD ELOS: 9-13d Medical Rehab Prognosis:  Good Assessment: 64 year old male with 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 He had blood cultures drawn by MD which was positive for gram-positive cocci. He was instructed to go to [facility] ED on 10/21/14 due to concerns of spine infection and was transferred to [facility] for evaluation. MRI cervical spine revealed epidural abscess with enhancing phlegmonous change from C1-C 6-7 with moderate to severe diffuse canal stenosis with flattening and displacement of cord, multifocal abscesses involving posterior paraspinous musculature predominantly on the left and abnormal dural enhancement extending superiorly through the foramen magnum. CT head negative for acute changes. Patient somnolent and encephalopathic with left sided neglect and quadriparesis at admission. Dr. Saintclair Halsted consulted for input and recommended management with IV antibiotics as extremely high surgical risk due to multiple comorbidities as well as coagulopathy.    Now requiring 24/7 Rehab RN,MD, as well as CIR level PT, OT and SLP.  Treatment team will focus on ADLs and mobility with goals set at Sup/Mod I  See Team Conference Notes for weekly updates to the plan of care

## 2014-10-27 NOTE — Progress Notes (Signed)
Occupational Therapy Note  Patient Details  Name: OATHER MUILENBURG MRN: [id number] Date of Birth: 11-24-1950  Today's Date: 10/27/2014 OT Individual Time: 1400-1445 OT Individual Time Calculation (min): 45 min   Pt denied pain (premedicated prior to therapy Individual Therapy   Pt resting in recliner with wife present.  Pt easily aroused but demonstrated difficulty keeping eyes open during therapy.  Pt initially engaged in LUE PROM, AAROM, and AROM tasks and exercises.  Pt unable to actively flex shoulder to 90 degrees.  PROM at approx 160 degrees.  Kinesio tape applied to left upper trapezius and cervical area for pain management and increased cervical ROM.  Pt transitioned to sit<>stand, SPT with RW, and bed mobility.  Pt performed transfer with steady A and required min A for sit->supine.  [name] [facility] 10/27/2014, 3:00 PM

## 2014-10-27 NOTE — Progress Notes (Signed)
Occupational Therapy Session Note  Patient Details  Name: [name] MRN: [id number] Date of Birth: 1950-07-17  Today's Date: 10/27/2014 OT Individual Time: 0800-0900 OT Individual Time Calculation (min): 60 min    Short Term Goals: Week 1:  OT Short Term Goal 1 (Week 1): Pt will complete LB dressing at supervision level OT Short Term Goal 2 (Week 1): Pt will complete toilet transfer at supervision level OT Short Term Goal 3 (Week 1): Pt will complete functional activity in standing for 3 min to increase activity tolerance  Skilled Therapeutic Interventions/Progress Updates:    Pt engaged in BADL retraining including bathing and dressing with sit<>stand from w/c at sink.  Pt requested to take a shower but pt on continuous IV.  Pt verbalized understanding.  Pt requires more than reasonable amount of time to complete all tasks with multiple rest breaks.  Pt performs sit<>stand from w/c with min A.  Pt required min verbal cues to use LUE/hand as gross assist with BADLs.  Pt unable to don shirt secondary to IV but did don pants, requiring assistance with pulling up on left side.  Focus on activity tolerance, sit<>stand, standing balance, transitional movements, LUE use, and safety awareness.  Therapy Documentation Precautions:  Precautions Precautions: Fall Restrictions Weight Bearing Restrictions: No   Pain: Pain Assessment Pain Assessment: 0-10 Pain Score: 0-No pain  See FIM for current functional status  Therapy/Group: Individual Therapy  Leroy Libman 10/27/2014, 9:04 AM

## 2014-10-27 NOTE — Progress Notes (Signed)
Physical Therapy Session Note  Patient Details  Name: [name] MRN: [id number] Date of Birth: 12/06/1950  Today's Date: 10/27/2014 PT Individual Time: 1000-1100 PT Individual Time Calculation (min): 60 min   Short Term Goals: Week 1:  PT Short Term Goal 1 (Week 1): Patient will perform supine <> sit with supervision PT Short Term Goal 2 (Week 1): Patient will perform bed to wheelchair transfer with supervision. PT Short Term Goal 3 (Week 1): Patient will ambulate 50 feet with rolling walker and min assist PT Short Term Goal 4 (Week 1): Patient will propel wheelchair 150 feet in controlled environment PT Short Term Goal 5 (Week 1): Patient will ambulate up and down 8 steps with bilateral railings and min assist.  Skilled Therapeutic Interventions/Progress Updates:  Patient resting in bed upon entering room. Patient reported pain at 7 in neck and left shoulder. RN notified and pain meds given. Patient supine to sit with min assist using bed rails. Patient transferred bed to wheelchair with min steady assist. Patient ambulated 75 feet with RW and min assist for steadying balance and push IV pole. See gait deficits below. PT attempted active neck ROM in sitting - pt unable to rotate neck in either direction. Patient sit to supine with min assist for left LE. Active assistive rotation with passive stretch to right and left x 3 per patient tolerance. Patient has very little rotation to left past midline due to pain. Patient performed LE active exercise x 15 reps each for SAQ's, heel slides, ankle pumps, and hip abduction/adduction. Patient supine to sit on mat with mod assist - coming up from left side. Patient sit to stand and ambulated with RW 195 feet with min assist. Patient left in recliner with wife present and items in reach.  Therapy Documentation Precautions:  Precautions Precautions: Fall Restrictions Weight Bearing Restrictions: No  Pain: Pain Assessment Pain Assessment:  0-10 Pain Score: 7  Pain Type: Acute pain Pain Location: Neck Pain Orientation: Right Pain Descriptors / Indicators: Aching Pain Frequency: Constant Pain Onset: On-going Patients Stated Pain Goal: 3 Pain Intervention(s): Medication (See eMAR) Multiple Pain Sites: Yes 2nd Pain Site Pain Score: 6 Pain Type: Acute pain Pain Location: Shoulder Pain Orientation: Left Pain Descriptors / Indicators: Aching Pain Onset: On-going Pain Intervention(s): RN made aware;Repositioned  Locomotion : Ambulation Ambulation/Gait Assistance: 4: Min assist Gait Gait: Yes Gait Pattern: Step-to pattern;Decreased step length - right;Decreased stance time - left;Decreased stride length;Trunk flexed;Narrow base of support Gait velocity: decreased   See FIM for current functional status  Therapy/Group: Individual Therapy  Elder Love M 10/27/2014, 1:54 PM

## 2014-10-27 NOTE — Progress Notes (Signed)
Administered soap suds enema per MD order.  Patient was able to tolerate entire 1500 ml bag.  Patient had liquid brown stool in BSC, and passed a good amount of gas during evacuation.  Patient was assisted back to bed and is now resting comfortably.  Will continue to monitor.  Brita Romp, RN

## 2014-10-27 NOTE — Progress Notes (Signed)
[facility] PHYSICAL MEDICINE & REHABILITATION     PROGRESS NOTE    Subjective/Complaints: Slept fairly well. Belly feels better. Had large BM yesterday. Having urine retention now (960cc this am)  Objective: Vital Signs: Blood pressure 132/75, pulse 73, temperature 98.5 F (36.9 C), temperature source Oral, resp. rate 17, height 5\' 10"  (1.778 m), weight 98.5 kg (217 lb 2.5 oz), SpO2 95 %. Dg Abd 1 View  10/25/2014   CLINICAL DATA:  64 year old male with a history of abdominal and back pain  EXAM: ABDOMEN - 1 VIEW  COMPARISON:  CT abdomen 02/22/2007  FINDINGS: Single frontal view of the lower abdomen demonstrates multiple air filled small bowel loops. Single loop in the left lower quadrant is borderline distended.  Gas within distal descending colon and sigmoid colon.  No abnormal calcifications identified  No displaced fracture.  IMPRESSION: Multiple air filled small bowel loops, without evidence of obstruction as gas extends through the sigmoid colon to the rectum. Findings can be seen in enteritis, or potentially an intermittent obstruction. If there is concern for acute abdominal process, CT may be useful.  Signed,  Dulcy Fanny. Earleen Newport, DO  [facility]  [facility]   Electronically Signed   By: Corrie Mckusick D.O.   On: 10/25/2014 21:29   Dg Abd Portable 1v  10/26/2014   CLINICAL DATA:  Ileus versus small-bowel obstruction.  EXAM: PORTABLE ABDOMEN - 1 VIEW  COMPARISON:  10/25/2014  FINDINGS: Continued evidence of air-filled nondilated loops of large and small bowel. Less air-filled small bowel loops compared to the previous exam. Air is present throughout the entire colon. No free peritoneal air noted. Remainder the exam is unchanged.  IMPRESSION: Interval decrease in number of air-filled nondilated small bowel loops. No evidence of obstruction.   Electronically Signed   By: Marin Olp M.D.   On: 10/26/2014 14:21    Recent Labs  10/26/14 0950   WBC 12.0*  HGB 12.4*  HCT 35.4*  PLT 26*    Recent Labs  10/26/14 0950 10/27/14 0535  NA 131* 130*  K 4.2 4.1  CL 97 96  GLUCOSE 199* 163*  BUN 14 12  CREATININE 0.67 0.59  CALCIUM 8.3* 8.1*   CBG (last 3)   Recent Labs  10/26/14 1643 10/26/14 2115 10/27/14 0648  GLUCAP 165* 215* 161*    Wt Readings from Last 3 Encounters:  10/27/14 98.5 kg (217 lb 2.5 oz)  06/13/14 88.905 kg (196 lb)  02/07/14 89.812 kg (198 lb)    Physical Exam:  Nursing note and vitals reviewed. Constitutional: He is oriented to person, place, and time. He appears well-developed and well-nourished.  HENT:  Head: Normocephalic and atraumatic.  Eyes: Conjunctivae are normal. Pupils are equal, round, and reactive to light.  Neck: Muscular tenderness present. Decreased range of motion present.     Cardiovascular: Normal rate and regular rhythm.  Respiratory: Effort normal and breath sounds normal. No respiratory distress. He has no wheezes.  GI: Abdomen softer but sill some distention. Bowel sounds are improved. There is no tenderness.  Musculoskeletal: He exhibits edema (1+ edema pedally).  Neck pain with ROM bilateral hips and LUE.  Neurological: He is alert and oriented to person, place, and time. Coordination abnormal.  Left inattention due to neck and shoulder pain limiting ROM.Marland Kitchen Anxious appearing but speech clear and is able to follow basic commands without difficulty. RUE 4+/5. L- deltoid 1+/5,1+ biceps, 3+ to 4- triceps, intrinsics 4+/5. BLE 3+/5 with ataxia. No  obvious sensory loss to LT/P.  DTR's 1+  Skin: Skin is warm and dry.  Healing abrasions bilateral shins  Psychiatric: His speech is normal.  . He's alert  Assessment/Plan: 1. Functional deficits secondary to cervical epidural abscess which require 3+ hours per day of interdisciplinary therapy in a comprehensive inpatient rehab setting. Physiatrist is providing close team supervision and 24 hour management of active  medical problems listed below. Physiatrist and rehab team continue to assess barriers to discharge/monitor patient progress toward functional and medical goals. FIM: FIM - Bathing Bathing Steps Patient Completed: Chest, Abdomen, Front perineal area, Buttocks, Right upper leg, Left upper leg Bathing: 3: Mod-Patient completes 5-7 89f 10 parts or 50-74%  FIM - Upper Body Dressing/Undressing Upper body dressing/undressing: 0: Wears gown/pajamas-no public clothing FIM - Lower Body Dressing/Undressing Lower body dressing/undressing: 1: Total-Patient completed less than 25% of tasks (donning socks only)  FIM - Toileting Toileting: 1: Total-Patient completed zero steps, helper did all 3  FIM - Radio producer Devices: Engineer, civil (consulting), Insurance account manager Transfers: 4-To toilet/BSC: Min A (steadying Pt. > 75%), 4-From toilet/BSC: Min A (steadying Pt. > 75%)  FIM - Bed/Chair Transfer Bed/Chair Transfer Assistive Devices: Walker, Bed rails, Arm rests Bed/Chair Transfer: 4: Supine > Sit: Min A (steadying Pt. > 75%/lift 1 leg), 3: Sit > Supine: Mod A (lifting assist/Pt. 50-74%/lift 2 legs), 4: Bed > Chair or W/C: Min A (steadying Pt. > 75%), 4: Chair or W/C > Bed: Min A (steadying Pt. > 75%)  FIM - Locomotion: Ambulation Locomotion: Ambulation: 0: Activity did not occur  Comprehension Comprehension Mode: Auditory Comprehension: 5-Understands basic 90% of the time/requires cueing < 10% of the time  Expression Expression Mode: Verbal Expression: 5-Expresses basic needs/ideas: With extra time/assistive device  Social Interaction Social Interaction: 4-Interacts appropriately 75 - 89% of the time - Needs redirection for appropriate language or to initiate interaction.  Problem Solving Problem Solving: 3-Solves basic 50 - 74% of the time/requires cueing 25 - 49% of the time  Memory Memory: 4-Recognizes or recalls 75 - 89% of the time/requires cueing 10 - 24% of the  time  Medical Problem List and Plan: 1. 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 DVT Prophylaxis/Anticoagulation: Mechanical:  Antiembolism stockings, knee (TED hose) Bilateral lower extremities Sequential compression devices, below knee Bilateral lower extremities 3. Pain Management: Duragesic patch 12.mcgt. Decrease oxycodone to 5mg  prn given neurosedation. 4. Mood: Team to provide ego support. .  5. Neuropsych: This patient is capable of making decisions on his own behalf. 6. Skin/Wound Care: Routine pressure relief measures. Maintain adequate nutritional support.  7. Fluids/Electrolytes/Nutrition: Monitor I/O.  Marland Kitchen  8. 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 Ammonia level pending. 9. Thrombocytopenia: Stable at 26 on most recent check. Labs pending 10. DM type 2: Hgb A1c- 8.7. Monitor BS with ac/hs checks and will use SSI for elevated BS. BS are slowly trending upwards with improvement in po intake. resumed Tradjenta as was on Invokana and Tradjenta at home. Titrate medications as indicated---stay with conservative approach for now given ileus 11. Reactive leucocytosis: Slowly resolving with treatment of infection.  12. Ileus: + results with SSE/program. Continue clear liquids. Re-check kub today.  -continue iv reglan at least for today  13. Urine retention: i/o cath prn  -oob to void  -check ua/cx today  -continue to check pvr's LOS (Days) 2 A FACE TO FACE EVALUATION WAS PERFORMED  SWARTZ,ZACHARY T 10/27/2014 7:10 AM

## 2014-10-27 NOTE — Progress Notes (Signed)
Speech Language Pathology Daily Session Note  Patient Details  Name: [name] MRN: [id number] Date of Birth: 1950/11/18  Today's Date: 10/27/2014 SLP Individual Time: 0900-1000 SLP Individual Time Calculation (min): 60 min  Short Term Goals: Week 1: SLP Short Term Goal 1 (Week 1): Pt will utilize external/internal aids to facilitate recall of new information during functional tasks with Min cues SLP Short Term Goal 1 - Progress (Week 1): Progressing toward goal SLP Short Term Goal 2 (Week 1): Pt will demonstrate mildly complex problem solving to complete functional tasks with Min cues SLP Short Term Goal 2 - Progress (Week 1): Progressing toward goal  Skilled Therapeutic Interventions: Skilled therapeutic session focused on cognitive-linguistic goals. Patient recalled working with therapist "counting money" and he said he was "surprised" at how difficult the task was, as well as how long it took him. Patient stated that he felt "loopy" yesterday but feels better today. SLP administered portions of the ALFA (Assessment of Language Related Functional Activities). He performed very well on counting  money/making change task(100% accurate), and understanding medicine labels(90% accurate). He initially had a lot of difficulty with solving the first of the daily math problems, but proceeded to get all other questions correct (90% accurate). The subtest that he had the most difficulty with was reading instructions, for which he was 70% accurate. Overall, patient appears to be progressing with his cognitive-linguistic abilities and likely there is some impact from pain medications. His wife was present for the session, and she stated that the MD was tapering his pain medications down in order to determine if the medication interactions are causing his cognitive impairments, or if there is something else going on. After 45 minutes, patient stated that he was feeling "loopy again", and he then was  observed to have difficulty keeping his eyes open. Continue with current plan of care.   FIM:  Comprehension Comprehension Mode: Auditory Comprehension: 5-Follows basic conversation/direction: With extra time/assistive device Expression Expression Mode: Verbal Expression: 5-Expresses basic needs/ideas: With extra time/assistive device Social Interaction Social Interaction: 5-Interacts appropriately 90% of the time - Needs monitoring or encouragement for participation or interaction. Problem Solving Problem Solving: 5-Solves basic 90% of the time/requires cueing < 10% of the time Memory Memory: 5-Recognizes or recalls 90% of the time/requires cueing < 10% of the time  Pain Pain Assessment Pain Assessment: 0-10 Pain Score: 7  Pain Type: Acute pain Pain Location: Neck Pain Orientation: Upper Pain Descriptors / Indicators: Aching Pain Frequency: Constant Pain Onset: On-going Patients Stated Pain Goal: 3 Pain Intervention(s): Medication (See eMAR) Multiple Pain Sites: Yes 2nd Pain Site Pain Score: 6 Pain Type: Acute pain Pain Location: Shoulder Pain Orientation: Left Pain Descriptors / Indicators: Aching Pain Onset: On-going Pain Intervention(s): RN made aware;Repositioned  Therapy/Group: Individual Therapy  Dannial Monarch 10/27/2014, 11:58 AM  Sonia Baller, MA, CCC-SLP 10/27/2014 11:58 AM

## 2014-10-28 ENCOUNTER — Inpatient Hospital Stay (HOSPITAL_COMMUNITY): Payer: Managed Care, Other (non HMO) | Admitting: *Deleted

## 2014-10-28 ENCOUNTER — Inpatient Hospital Stay (HOSPITAL_COMMUNITY): Payer: Managed Care, Other (non HMO) | Admitting: Occupational Therapy

## 2014-10-28 ENCOUNTER — Inpatient Hospital Stay (HOSPITAL_COMMUNITY): Payer: Managed Care, Other (non HMO) | Admitting: Speech Pathology

## 2014-10-28 ENCOUNTER — Inpatient Hospital Stay (HOSPITAL_COMMUNITY): Payer: Managed Care, Other (non HMO)

## 2014-10-28 DIAGNOSIS — M7989 Other specified soft tissue disorders: Secondary | ICD-10-CM

## 2014-10-28 LAB — URINALYSIS, ROUTINE W REFLEX MICROSCOPIC
BILIRUBIN URINE: NEGATIVE
Ketones, ur: NEGATIVE mg/dL
LEUKOCYTES UA: NEGATIVE
Nitrite: NEGATIVE
Protein, ur: NEGATIVE mg/dL
Specific Gravity, Urine: 1.036 — ABNORMAL HIGH (ref 1.005–1.030)
Urobilinogen, UA: 1 mg/dL (ref 0.0–1.0)
pH: 6 (ref 5.0–8.0)

## 2014-10-28 LAB — GLUCOSE, CAPILLARY
GLUCOSE-CAPILLARY: 179 mg/dL — AB (ref 70–99)
GLUCOSE-CAPILLARY: 254 mg/dL — AB (ref 70–99)
Glucose-Capillary: 243 mg/dL — ABNORMAL HIGH (ref 70–99)

## 2014-10-28 LAB — URINE MICROSCOPIC-ADD ON

## 2014-10-28 NOTE — Progress Notes (Signed)
Occupational Therapy Session Note  Patient Details  Name: [name] MRN: [id number] Date of Birth: 04/27/1951  Today's Date: 10/28/2014 OT Individual Time: 1345-1445 OT Individual Time Calculation (min): 60 min   Short Term Goals: Week 1:  OT Short Term Goal 1 (Week 1): Pt will complete LB dressing at supervision level OT Short Term Goal 2 (Week 1): Pt will complete toilet transfer at supervision level OT Short Term Goal 3 (Week 1): Pt will complete functional activity in standing for 3 min to increase activity tolerance  Skilled Therapeutic Interventions/Progress Updates:  No complaints of pain Patient received supine in bed with wife present at bed side. Therapist explained importance of AAROM > LUE to increase ROM and overall strength. Educated wife and patient on shoulder flexion/extension, elbow flexion/extension, wrist flexion/extension, and digit flexion/extension exercises. Patient then engaged in bed mobility, sat EOB, and transferred EOB>w/c with min assist (stand pivot). Therapist then assisted patient > sink for ADL retraining. Patient completed grooming tasks of washing face, brushing teeth, and shaving. Patient then stood for UB bathing tasks and sat for UB dressing. At end of session, left patient seated in w/c with all needs within reach and wife present.   Therapy Documentation Precautions:  Precautions Precautions: Fall Restrictions Weight Bearing Restrictions: No  Vital Signs: Therapy Vitals Temp: 98.1 F (36.7 C) Temp Source: Oral Pulse Rate: 90 Resp: 16 BP: 126/64 mmHg Patient Position (if appropriate): Lying Oxygen Therapy SpO2: 94 % O2 Device: Not Delivered  See FIM for current functional status  Therapy/Group: Individual Therapy  Brithany Whitworth , MS, OTR/L, CLT  10/28/2014, 2:55 PM

## 2014-10-28 NOTE — Progress Notes (Signed)
Patient penis swollen compared this morning, bilateral lower extremities  cold to touch ,2+ pitting edema  pale, discoloration.3+ pedal pulses noted.  Dr. Leanne Chang notified. Rapid response nurse and charge nurse notified. Dr. Leanne Chang ordered for UA/Urinalysis, venous doppler to bilateral LE stat.  Scrotum enlarged and redness noted. Elevated the scrotum via towel . Foley catheter was inserted as per Dr. Leanne Chang with clear yellow urine output  Abdomen distended with hyperactive bowel sounds. Patient's spouse at bedside. Vital signs taken and recorded. Next shift nurse made aware.

## 2014-10-28 NOTE — Progress Notes (Signed)
Patient with bilat legs with 2+ pitting edema.  Legs cool to touch, pale, knees slightly mottled.  Cap refill 3-5 sec.  3+pedal pulse.  Lung sounds clear, heart tones regular.  Abdomen distended, hyperactive bowel sounds.   Patient alert but states that he does not feel well, flat affect.  NT has recently placed foley, clear yellow urine. Spoke with Dr Judd Gaudier via phone, he stated that he would see patient this evening.  Rn to call if assistance needed.

## 2014-10-28 NOTE — Progress Notes (Signed)
   PHYSICAL MEDICINE & REHABILITATION     PROGRESS NOTE    Subjective/Complaints:  no complaints  Objective: Vital Signs: Blood pressure 126/64, pulse 90, temperature 98.1 F (36.7 C), temperature source Oral, resp. rate 16, height 5\' 10"  (1.778 m), weight 207 lb 10.8 oz (94.2 kg), SpO2 94 %.   well-developed well-nourished male in no acute distress. HEENT exam atraumatic, normocephalic, neck supple without jugular venous distention. Chest clear to auscultation cardiac exam S1-S2 are regular. Abdominal exam overweight with bowel sounds, soft and nontender. Extremities 1+ LE edema.  Assessment/Plan: 1. Functional deficits secondary to cervical epidural abscess   Medical Problem List and Plan: 1. 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 DVT Prophylaxis/Anticoagulation: Mechanical:  Antiembolism stockings, knee (TED hose) Bilateral lower extremities Sequential compression devices, below knee Bilateral lower extremities 3. Pain Management: Duragesic patch 12.mcgt. Decrease oxycodone to 5mg  prn given neurosedation. 4. Mood: Team to provide ego support. .  5. Neuropsych: This patient is capable of making decisions on his own behalf. 6. Skin/Wound Care: Routine pressure relief measures. Maintain adequate nutritional support.  7. Fluids/Electrolytes/Nutrition: Monitor I/O.  Marland Kitchen  8. 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 . .. 9. Thrombocytopenia: Stable at 26 on most recent check. Labs pending 10. DM type 2: Hgb A1c- 8.7. Monitor BS with ac/hs checks and will use SSI for elevated BS. BS are slowly trending upwards with improvement in po intake. resumed Tradjenta as was on Invokana and Tradjenta at home. Titrate medications as indicated---stay with conservative approach for now given ileus CBG (last 3)   Recent Labs  10/27/14 1149 10/27/14 1659 10/27/14 2049  GLUCAP 188* 180* 175*    11. Reactive leucocytosis: Slowly resolving with treatment of infection.  12. Ileus: + results with SSE/program. Continue clear liquids. Re-check kub today.  -continue iv reglan at least for today 13. Urine retention: i/o cath prn  -oob to void  -check ua/cx today  -continue to check pvr's LOS (Days) 3 A FACE TO [place] 10/28/2014 6:23 AM

## 2014-10-28 NOTE — Progress Notes (Signed)
Physical Therapy Session Note  Patient Details  Name: [name] MRN: [id number] Date of Birth: Mar 25, 1951  Today's Date: 10/28/2014 PT Individual Time: 1000-1100 PT Individual Time Calculation (min): 60 min     Skilled Therapeutic Interventions/Progress Updates:  Patient in his room, on bed side commode at the beginning of the session, assistance in getting off and standing while NT assists in hygiene.  Gait Training: With RW and close w/c follow due to reported fatigue, patient was able to actively participate in gait training 4 x 45 feet with CG-min A. Verbal cues for step length and posture.  NuStep training 2 x 5 min on level 3 with use of B UE and LE,in order to increase strength activity tolerance and facilitate reciprocal patterns.While on Nustep ,hat pack applied to neck due to pain and discomfort. Transfer training from w/c to  Mat and back -min A. Manual interventions to reduce pain ini upper trap region and neck extensors. Trigger point release and gentle massage-active exercises to decrease tension and strech. Patient returned to room, wife present ,all needs within reach.   Therapy Documentation Precautions:  Precautions Precautions: Fall Restrictions Weight Bearing Restrictions: No Pain: Pain Assessment Pain Assessment: No/denies pain  See FIM for current functional status  Therapy/Group: Individual Therapy  [name] 10/28/2014, 12:48 PM

## 2014-10-28 NOTE — Progress Notes (Signed)
Occupational Therapy Session Note  Patient Details  Name: [name] MRN: [id number] Date of Birth: October 08, 1950  Today's Date: 10/28/2014 OT Individual Time: 1515-1600 OT Individual Time Calculation (min): 45 min    Short Term Goals: Week 1:  OT Short Term Goal 1 (Week 1): Pt will complete LB dressing at supervision level OT Short Term Goal 2 (Week 1): Pt will complete toilet transfer at supervision level OT Short Term Goal 3 (Week 1): Pt will complete functional activity in standing for 3 min to increase activity tolerance  Skilled Therapeutic Interventions/Progress Updates:    Upon entering the room, pt seated in wheelchair with wife present in room. Pt reporting extreme fatigue and OT educating pt and caregiver on energy conservation techniques for occupational performance as well as community activities such as when pt goes to physician appointments. Pt and caregiver verbalized understanding but would like additional education in this area throughout inpatient rehab stay. Pt propelled to bathroom via wheelchair for stand pivot transfer with grab bar and mod A. Pt having BM and requiring total A for hygiene and clothing management. Pt then reports 7/10 pain and wishes to return to bed. Mod A stand pivot with therapist from wheelchair >bed. Min A sit >supine. RN notified of pain level. Pt positioned into R side laying position for comfort. Call bell and all needed items within reach and bed alarm activated.   Therapy Documentation Precautions:  Precautions Precautions: Fall Restrictions Weight Bearing Restrictions: No Pain: Pain Assessment Pain Score: 7   See FIM for current functional status  Therapy/Group: Individual Therapy  Phineas Semen 10/28/2014, 4:10 PM

## 2014-10-28 NOTE — Progress Notes (Signed)
Speech Language Pathology Daily Session Note  Patient Details  Name: [name] MRN: [id number] Date of Birth: 1950/11/05  Today's Date: 10/28/2014 SLP Individual Time: 3094-0768 SLP Individual Time Calculation (min): 30 min  Short Term Goals: Week 1: SLP Short Term Goal 1 (Week 1): Pt will utilize external/internal aids to facilitate recall of new information during functional tasks with Min cues SLP Short Term Goal 1 - Progress (Week 1): Progressing toward goal SLP Short Term Goal 2 (Week 1): Pt will demonstrate mildly complex problem solving to complete functional tasks with Min cues SLP Short Term Goal 2 - Progress (Week 1): Progressing toward goal  Skilled Therapeutic Interventions: Skilled ST intervention provided with tx focus on cognitive goals. Upon entry, pt had just returned from testing downstairs and was beginning to eat AM meal. Slp continued formal cog-comm testing via ALFA assessment. Pt demonstrated appropriate selective and alternating attention by participating in ALFA testing while at the same time, eating his breakfast; no redirections required. Pt completed both "telling time" and "using a calendar" subtest with 100% accuracy.    FIM:  Comprehension Comprehension Mode: Auditory Comprehension: 5-Follows basic conversation/direction: With extra time/assistive device Expression Expression Mode: Verbal Expression: 5-Expresses basic needs/ideas: With no assist Social Interaction Social Interaction: 5-Interacts appropriately 90% of the time - Needs monitoring or encouragement for participation or interaction. Problem Solving Problem Solving: 5-Solves complex 90% of the time/cues < 10% of the time Memory Memory: 5-Recognizes or recalls 90% of the time/requires cueing < 10% of the time FIM - Eating Eating Activity: 6: Modified consistency diet: (comment)  Pain Pain Assessment Pain Assessment: No/denies pain  Therapy/Group: Individual Therapy  [name], Bernerd Pho 10/28/2014, 9:46 AM

## 2014-10-29 ENCOUNTER — Inpatient Hospital Stay (HOSPITAL_COMMUNITY): Payer: Managed Care, Other (non HMO)

## 2014-10-29 LAB — GLUCOSE, CAPILLARY
GLUCOSE-CAPILLARY: 222 mg/dL — AB (ref 70–99)
Glucose-Capillary: 203 mg/dL — ABNORMAL HIGH (ref 70–99)
Glucose-Capillary: 291 mg/dL — ABNORMAL HIGH (ref 70–99)
Glucose-Capillary: 239 mg/dL — ABNORMAL HIGH (ref 70–99)
Glucose-Capillary: 278 mg/dL — ABNORMAL HIGH (ref 70–99)

## 2014-10-29 LAB — URINE CULTURE
Colony Count: NO GROWTH
Culture: NO GROWTH

## 2014-10-29 MED ORDER — INSULIN ASPART 100 UNIT/ML ~~LOC~~ SOLN
0.0000 [IU] | Freq: Three times a day (TID) | SUBCUTANEOUS | Status: DC
  Administered 2014-10-29: 5 [IU] via SUBCUTANEOUS
  Administered 2014-10-29: 8 [IU] via SUBCUTANEOUS
  Administered 2014-10-30: 5 [IU] via SUBCUTANEOUS
  Administered 2014-10-30 (×2): 8 [IU] via SUBCUTANEOUS
  Administered 2014-10-31 – 2014-11-02 (×7): 3 [IU] via SUBCUTANEOUS
  Administered 2014-11-02: 5 [IU] via SUBCUTANEOUS
  Administered 2014-11-03: 3 [IU] via SUBCUTANEOUS
  Administered 2014-11-03: 2 [IU] via SUBCUTANEOUS
  Administered 2014-11-03 – 2014-11-06 (×9): 3 [IU] via SUBCUTANEOUS
  Administered 2014-11-06: 2 [IU] via SUBCUTANEOUS
  Administered 2014-11-07 (×2): 3 [IU] via SUBCUTANEOUS
  Administered 2014-11-08: 5 [IU] via SUBCUTANEOUS
  Administered 2014-11-08 – 2014-11-09 (×3): 3 [IU] via SUBCUTANEOUS
  Administered 2014-11-10 – 2014-11-11 (×4): 2 [IU] via SUBCUTANEOUS

## 2014-10-29 NOTE — Progress Notes (Signed)
Physical Therapy Session Note  Patient Details  Name: [name] MRN: [id number] Date of Birth: 1951/04/12  Today's Date: 10/29/2014 PT Individual Time: 0845-0900 PT Individual Time Calculation (min): 15 min   Short Term Goals: Week 1:  PT Short Term Goal 1 (Week 1): Patient will perform supine <> sit with supervision PT Short Term Goal 2 (Week 1): Patient will perform bed to wheelchair transfer with supervision. PT Short Term Goal 3 (Week 1): Patient will ambulate 50 feet with rolling walker and min assist PT Short Term Goal 4 (Week 1): Patient will propel wheelchair 150 feet in controlled environment PT Short Term Goal 5 (Week 1): Patient will ambulate up and down 8 steps with bilateral railings and min assist.  Skilled Therapeutic Interventions/Progress Updates:    Pt received , agreeable to participate in therapy in room. Pt reported feeling better than yesterday, but felt his LE swelling was worse and requested to get back in bed. Pt ambulated around room w/ MinGuard A and RW w/ MinA to come to standing. Pt moved sit>supine w/ MinA to manage LLE onto bed. Instructed pt in multiple ankle pumps and educated on use of exercise to assist with reducing LE swelling. Pt reported need for enema from nurse due to abdominal pressure, RN made aware. Pt left supine in bed w/ wife present and all needs within reach. Pt missed 15 minutes of scheduled PT due to fatigue.    Therapy Documentation Precautions:  Precautions Precautions: Fall Restrictions Weight Bearing Restrictions: No Pain:  No/denies pain  See FIM for current functional status  Therapy/Group: Individual Therapy  Rada Hay  Rada Hay, PT, DPT 10/29/2014, 7:30 AM

## 2014-10-29 NOTE — Progress Notes (Signed)
VASCULAR LAB PRELIMINARY  PRELIMINARY  PRELIMINARY  PRELIMINARY  Bilateral lower extremity venous Dopplers completed.    Preliminary report:  There is no DVT or SVT noted in the bilateral lower extremities.    Jalayla Chrismer, RVT 10/29/2014, 12:24 PM

## 2014-10-29 NOTE — Progress Notes (Signed)
  [facility] PHYSICAL MEDICINE & REHABILITATION     PROGRESS NOTE    Subjective/Complaints: He continues to feel better. He feels like he has more energy. Note yesterday I evaluated the patient in the evening. The nurse was concerned that his lower extremities were somewhat cool. When I evaluated him and concerns were identified.  Objective: Vital Signs: Blood pressure 137/67, pulse 86, temperature 98.3 F (36.8 C), temperature source Oral, resp. rate 16, height 5\' 10"  (1.778 m), weight 207 lb 10.8 oz (94.2 kg), SpO2 93 %.   well-developed well-nourished male in no acute distress. HEENT exam atraumatic, normocephalic, neck supple without jugular venous distention. Chest clear to auscultation cardiac exam S1-S2 are regular. Abdominal exam overweight with bowel sounds, soft and nontender. His abdomen is somewhat distendedscites. Extremities 2+ LE edema.  Assessment/Plan: 1. Functional deficits secondary to cervical epidural abscess   Medical Problem List and Plan: 1. 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 DVT Prophylaxis/Anticoagulation: Mechanical:  Antiembolism stockings, knee (TED hose) Bilateral lower extremities Sequential compression devices, below knee Bilateral lower extremities 3. Pain Management: Duragesic patch 12.mcgt. Decrease oxycodone to 5mg  prn given neurosedation. 4. Mood: Team to provide ego support. .  5. Neuropsych: This patient is capable of making decisions on his own behalf. 6. Skin/Wound Care: Routine pressure relief measures. Maintain adequate nutritional support.  7. Fluids/Electrolytes/Nutrition: Monitor I/O.  Marland Kitchen  8. 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 Marland Kitchen.On exam he has significant ascites and also has lower extremity edema. I think it's time that he start a diuretic. We'll start  low-dose spironolactone. 9. Thrombocytopenia: Stable at 26 on most recent check. Labs pending 10. DM type 2: Hgb A1c- 8.7. Monitor BS with ac/hs checks and will use SSI for elevated BS. BS are slowly trending upwardsWill change sliding scale insulin. CBG (last 3)   Recent Labs  10/28/14 1641 10/28/14 2046 10/29/14 0650  GLUCAP 203* 243* 222*    11. Reactive leucocytosis: Slowly resolving with treatment of infection.  12. Ileus: + results with SSE/program. Continue clear liquids. Re-check kub today.  -continue iv reglan at least for today 13. Urine retention: i/o cath prn  -oob to void  -check ua/cx today  -continue to check pvr's LOS (Days) 4 A FACE TO [place] 10/29/2014 7:58 AM

## 2014-10-30 ENCOUNTER — Inpatient Hospital Stay (HOSPITAL_COMMUNITY): Payer: Managed Care, Other (non HMO)

## 2014-10-30 ENCOUNTER — Inpatient Hospital Stay (HOSPITAL_COMMUNITY): Payer: Managed Care, Other (non HMO) | Admitting: Speech Pathology

## 2014-10-30 ENCOUNTER — Inpatient Hospital Stay (HOSPITAL_COMMUNITY): Payer: Managed Care, Other (non HMO) | Admitting: Physical Therapy

## 2014-10-30 ENCOUNTER — Encounter (HOSPITAL_COMMUNITY): Payer: Self-pay | Admitting: Physician Assistant

## 2014-10-30 DIAGNOSIS — K7581 Nonalcoholic steatohepatitis (NASH): Secondary | ICD-10-CM

## 2014-10-30 LAB — COMPREHENSIVE METABOLIC PANEL
ALT: 16 U/L (ref 0–53)
ANION GAP: 5 (ref 5–15)
AST: 29 U/L (ref 0–37)
Albumin: 1.7 g/dL — ABNORMAL LOW (ref 3.5–5.2)
Alkaline Phosphatase: 88 U/L (ref 39–117)
BUN: 11 mg/dL (ref 6–23)
CO2: 27 mmol/L (ref 19–32)
CREATININE: 0.62 mg/dL (ref 0.50–1.35)
Calcium: 8 mg/dL — ABNORMAL LOW (ref 8.4–10.5)
Chloride: 95 mmol/L — ABNORMAL LOW (ref 96–112)
GFR calc Af Amer: >90 mL/min (ref >90)
Glucose, Bld: 288 mg/dL — ABNORMAL HIGH (ref 70–99)
POTASSIUM: 4.4 mmol/L (ref 3.5–5.1)
Sodium: 127 mmol/L — ABNORMAL LOW (ref 135–145)
TOTAL PROTEIN: 6.4 g/dL (ref 6.0–8.3)
Total Bilirubin: 1.4 mg/dL — ABNORMAL HIGH (ref 0.3–1.2)

## 2014-10-30 LAB — GLUCOSE, CAPILLARY
GLUCOSE-CAPILLARY: 298 mg/dL — AB (ref 70–99)
GLUCOSE-CAPILLARY: 268 mg/dL — AB (ref 70–99)
Glucose-Capillary: 223 mg/dL — ABNORMAL HIGH (ref 70–99)
Glucose-Capillary: 258 mg/dL — ABNORMAL HIGH (ref 70–99)

## 2014-10-30 MED ORDER — GLIMEPIRIDE 2 MG PO TABS: 2.0000 mg | ORAL_TABLET | Freq: Every day | ORAL | Status: DC

## 2014-10-30 MED ORDER — SODIUM CHLORIDE 0.9 % IJ SOLN
10.0000 mL | INTRAMUSCULAR | Status: DC | PRN
  Administered 2014-10-30: 20 mL
  Administered 2014-10-31 – 2014-11-05 (×6): 10 mL
  Administered 2014-11-07: 40 mL
  Administered 2014-11-08 – 2014-11-11 (×5): 10 mL
  Filled 2014-10-30 (×12): qty 40

## 2014-10-30 MED ORDER — SPIRONOLACTONE 25 MG PO TABS: 25.0000 mg | ORAL_TABLET | Freq: Once | ORAL | Status: DC

## 2014-10-30 MED ORDER — METOCLOPRAMIDE HCL 5 MG PO TABS
5.0000 mg | ORAL_TABLET | Freq: Three times a day (TID) | ORAL | Status: DC
  Administered 2014-10-30 – 2014-10-31 (×3): 5 mg via ORAL
  Filled 2014-10-30 (×6): qty 1

## 2014-10-30 MED ORDER — SPIRONOLACTONE 50 MG PO TABS
50.0000 mg | ORAL_TABLET | Freq: Every day | ORAL | Status: DC
  Administered 2014-10-31 – 2014-11-01 (×2): 50 mg via ORAL
  Filled 2014-10-30 (×4): qty 1

## 2014-10-30 MED ORDER — SPIRONOLACTONE 25 MG PO TABS
25.0000 mg | ORAL_TABLET | Freq: Every day | ORAL | Status: DC
  Filled 2014-10-30: qty 1

## 2014-10-30 MED ORDER — GLIMEPIRIDE 2 MG PO TABS
2.0000 mg | ORAL_TABLET | Freq: Every day | ORAL | Status: DC
  Filled 2014-10-30 (×3): qty 1

## 2014-10-30 MED ORDER — FUROSEMIDE 20 MG PO TABS
20.0000 mg | ORAL_TABLET | Freq: Every day | ORAL | Status: DC
  Administered 2014-10-30 – 2014-11-06 (×8): 20 mg via ORAL
  Filled 2014-10-30 (×9): qty 1

## 2014-10-30 MED ORDER — SPIRONOLACTONE 50 MG PO TABS
50.0000 mg | ORAL_TABLET | Freq: Once | ORAL | Status: AC
  Administered 2014-10-30: 50 mg via ORAL
  Filled 2014-10-30: qty 1

## 2014-10-30 MED ORDER — POLYETHYLENE GLYCOL 3350 17 G PO PACK
17.0000 g | PACK | Freq: Two times a day (BID) | ORAL | Status: DC
  Administered 2014-10-30 – 2014-10-31 (×2): 17 g via ORAL
  Filled 2014-10-30 (×4): qty 1

## 2014-10-30 NOTE — Care Management Note (Signed)
Inpatient Rehabilitation Center Individual Statement of Services  Patient Name:  [name]  Date:  10/27/2014  Welcome to the [facility].  Our goal is to provide you with an individualized program based on your diagnosis and situation, designed to meet your specific needs.  With this comprehensive rehabilitation program, you will be expected to participate in at least 3 hours of rehabilitation therapies Monday-Friday, with modified therapy programming on the weekends.  Your rehabilitation program will include the following services:  Physical Therapy (PT), Occupational Therapy (OT), 24 hour per day rehabilitation nursing, Therapeutic Recreaction (TR), Neuropsychology, Case Management (Social Worker), Rehabilitation Medicine, Nutrition Services and Pharmacy Services  Weekly team conferences will be held on Tuesdays to discuss your progress.  Your Social Worker will talk with you frequently to get your input and to update you on team discussions.  Team conferences with you and your family in attendance may also be held.  Expected length of stay: 10-12 days  Overall anticipated outcome: supervision/ independent  Depending on your progress and recovery, your program may change. Your Social Worker will coordinate services and will keep you informed of any changes. Your Social Worker's name and contact numbers are listed  below.  The following services may also be recommended but are not provided by the [facility] will be made to provide these services after discharge if needed.  Arrangements include referral to agencies that provide these services.  Your insurance has been verified to be:  [place] Your primary doctor is:  [name]  Pertinent information will be shared with your doctor and your insurance  company.  Social Worker:  [place], [place] or ([phone]   Information discussed with and copy given to patient by: Lennart Pall, 10/27/2014, 3:32

## 2014-10-30 NOTE — Progress Notes (Signed)
Speech Language Pathology Daily Session Note  Patient Details  Name: [name] MRN: [id number] Date of Birth: 08/18/1950  Today's Date: 10/30/2014 SLP Individual Time: 1000-1100 SLP Individual Time Calculation (min): 60 min  Short Term Goals: Week 1: SLP Short Term Goal 1 (Week 1): Pt will utilize external/internal aids to facilitate recall of new information during functional tasks with Min cues SLP Short Term Goal 1 - Progress (Week 1): Progressing toward goal SLP Short Term Goal 2 (Week 1): Pt will demonstrate mildly complex problem solving to complete functional tasks with Min cues SLP Short Term Goal 2 - Progress (Week 1): Progressing toward goal  Skilled Therapeutic Interventions: Skilled ST intervention provided with tx focus on cognitive goals. Upon entry, pt's wife was seated at bedside, and pt was sleeping soundly. Mod verbal and tactile stim were required to arouse patient, however, once awakened, pt required no cues to remain alert. Pt able to verbalize cognitive deficits he feels need to be addressed, including money management, med management, following written directions, and functional math skills. Pt completed deduction reasoning puzzle #1 with mod cues given (4/10 clues completed independently).    FIM:  Comprehension Comprehension Mode: Auditory Comprehension: 4-Understands basic 75 - 89% of the time/requires cueing 10 - 24% of the time Expression Expression Mode: Verbal Expression: 4-Expresses basic 75 - 89% of the time/requires cueing 10 - 24% of the time. Needs helper to occlude trach/needs to repeat words. Social Interaction Social Interaction: 4-Interacts appropriately 75 - 89% of the time - Needs redirection for appropriate language or to initiate interaction. Problem Solving Problem Solving: 4-Solves basic 75 - 89% of the time/requires cueing 10 - 24% of the time Memory Memory: 5-Recognizes or recalls 90% of the time/requires cueing < 10% of the  time  Pain Pain Assessment Pain Assessment: No/denies pain Pain Score: 3   Therapy/Group: Individual Therapy   Celia B. [place], [facility], [place]  Shonna Chock 10/30/2014, 12:49 PM

## 2014-10-30 NOTE — Progress Notes (Signed)
Occupational Therapy Session Note  Patient Details  Name: [name] MRN: [id number] Date of Birth: December 07, 1950  Today's Date: 10/30/2014 OT Individual Time: 0700-0800 and 1300-1330 OT Individual Time Calculation (min): 60 min and 30 min     Short Term Goals: Week 1:  OT Short Term Goal 1 (Week 1): Pt will complete LB dressing at supervision level OT Short Term Goal 2 (Week 1): Pt will complete toilet transfer at supervision level OT Short Term Goal 3 (Week 1): Pt will complete functional activity in standing for 3 min to increase activity tolerance  Skilled Therapeutic Interventions/Progress Updates:    Session 1: Pt seen for ADL retraining with focus on functional mobility, cognitive remediation, standing balance, functional use of LUE, and activity tolerance. Pt received supine in bed reporting he slept well however still feels "weak." Pt agreeable to shower this AM. Ambulated bed>bathroom with min-mod A using RW and mod cues for safety with RW. Pt demonstrating flexed posture during functional mobility. Engaged in bathing at shower level with max cues for initiation and sequencing. Pt required max A and max cues for functional use of LUE as pt continuously stating "I can't" when asked to incorporate it into self-care tasks. Pt completed stand pivot transfer TTB>w/c and donned gown secondary to pt declining clothing. Pt left sitting in w/c with all needs in reach and wife present. Pt very lethargic and demonstrating flat affect throughout session which is new from last time this therapist saw pt 4 days ago. Pt reporting not feeling tired, just "weak."  Session 2: Pt seen for 1:1 OT session with focus on LUE NMR and attention to L. Pt received supine in bed requesting to complete therapy from bed level despite education on importance of participation OOB. Pt reporting feeling "too weak" to get OOB at this time. Engaged in [place] activities with use of 1# dowel bar. Utilized BUE with  focus on keeping bar level during shoulder flexion and chest press exercises. Pt required max A for shoulder flexion and chest press. Completed bicep curl with dowel bar and min A to manage LUE with cues for awareness of positioning of L hand on bar. Pt demonstrated improved grip strength as he was able to pick up wash cloth 5 times with L hand and manipulate into ball. At end of session pt left supine in bed with all needs in reach. No pain noted during NMR exercises to LUE.  Therapy Documentation Precautions:  Precautions Precautions: Fall Restrictions Weight Bearing Restrictions: No General:   Vital Signs:   Pain: Pain Assessment Pain Assessment: 0-10 Pain Score: 3  Pain Type: Acute pain Pain Location: Neck Pain Descriptors / Indicators: Aching Pain Frequency: Intermittent Pain Onset: With Activity Patients Stated Pain Goal: 4 Pain Intervention(s): Medication (See eMAR)  See FIM for current functional status  Therapy/Group: Individual Therapy  Duayne Cal 10/30/2014, 11:37 AM

## 2014-10-30 NOTE — Progress Notes (Signed)
Social Work  Social Work Assessment and Plan  Patient Details  Name: [name] MRN: [id number] Date of Birth: 1950-09-15  Today's Date: 10/27/2014  Problem List:  Patient Active Problem List   Diagnosis Date Noted  . Ileus, postoperative 10/26/2014  . Left cervical radiculopathy 10/25/2014  . Cervical myelopathy 10/25/2014  . Thrombocytopenia 10/22/2014  . Group B streptococcal infection   . Abscess in epidural space of cervical spine   . Encephalopathy acute   . Streptococcal bacteremia   . NASH (nonalcoholic steatohepatitis)   . Other cirrhosis of liver   . Diabetes mellitus type 2, uncontrolled   . 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 UGIB (upper gastrointestinal bleed) 10/27/2012  . Anemia due to acute blood loss 10/27/2012  . Volume depletion 10/27/2012  . 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 GERD 03/28/2010  . CONSTIPATION 03/28/2010  . ABDOMINAL PAIN, GENERALIZED 03/28/2010  . ASCITES 06/13/2008  . PERSONAL HX COLONIC POLYPS 06/13/2008  . DM 09/27/2007  . OBESITY 09/27/2007  . PANCYTOPENIA 09/27/2007  . HYPERTENSION 09/27/2007  . INTERNAL HEMORRHOIDS 09/27/2007  . CIRRHOSIS 09/27/2007  . NEPHROLITHIASIS 09/27/2007  . IRON DEFICIENCY ANEMIA, HX OF 09/27/2007  . Personal history of other diseases of digestive system 09/27/2007  . COLONIC POLYPS, ADENOMATOUS 11/19/2006  . ESOPHAGEAL VARICES 11/19/2006  . DUODENITIS 11/19/2006  . ESOPHAGITIS, REFLUX 09/23/2004  . ESOPHAGEAL STRICTURE 09/23/2004  . Portal hypertension 09/23/2004  . DIVERTICULOSIS, COLON 08/14/2004   Past Medical History:  Past Medical History  Diagnosis Date  . Reflux esophagitis   . Portal hypertension   . Stricture and stenosis of esophagus   . Diverticulosis of colon (without mention of hemorrhage)   . Internal hemorrhoids without mention of complication   . Benign neoplasm of colon   . Duodenitis without mention of hemorrhage   . Esophageal varices without  mention of bleeding   . Personal history of diseases of blood and blood-forming organs   . Cirrhosis of liver without mention of alcohol   . Unspecified essential hypertension   . Obesity, unspecified   . Type II or unspecified type diabetes mellitus without mention of complication, not stated as uncontrolled   . GERD (gastroesophageal reflux disease)   . Hemorrhoids   . Colon polyps   . Pancytopenia   . Calculus of kidney yrs ago   Past Surgical History:  Past Surgical History  Procedure Laterality Date  . Kidney stone surgery  yrs ago  . Esophagogastroduodenoscopy  08/01/2011    Procedure: ESOPHAGOGASTRODUODENOSCOPY (EGD);  Surgeon: Scarlette Shorts, MD;  Location: [facility];  Service: Endoscopy;  Laterality: N/A;  . Esophagogastroduodenoscopy N/A 10/27/2012    Procedure: ESOPHAGOGASTRODUODENOSCOPY (EGD);  Surgeon: Inda Castle, MD;  Location: [facility];  Service: Endoscopy;  Laterality: N/A;  . Esophagogastroduodenoscopy N/A 11/22/2012    Procedure: ESOPHAGOGASTRODUODENOSCOPY (EGD);  Surgeon: Irene Shipper, MD;  Location: [facility];  Service: Endoscopy;  Laterality: N/A;  . Colonoscopy    . Upper gastrointestinal endoscopy    . Esophagogastroduodenoscopy N/A 11/29/2013    Procedure: ESOPHAGOGASTRODUODENOSCOPY (EGD);  Surgeon: Irene Shipper, MD;  Location: [facility];  Service: Endoscopy;  Laterality: N/A;  . Esophageal banding Bilateral 11/29/2013    Procedure: ESOPHAGEAL BANDING;  Surgeon: Irene Shipper, MD;  Location: [facility];  Service: Endoscopy;  Laterality: Bilateral;  . Esophagogastroduodenoscopy N/A 06/13/2014    Procedure: ESOPHAGOGASTRODUODENOSCOPY (EGD);  Surgeon: Irene Shipper, MD;  Location: [facility];  Service: Endoscopy;  Laterality: N/A;  . Esophageal banding  N/A 06/13/2014    Procedure: ESOPHAGEAL BANDING;  Surgeon: Irene Shipper, MD;  Location: [facility];  Service: Endoscopy;  Laterality: N/A;   Social History:  reports that he has never smoked. He has  never used smokeless tobacco. He reports that he does not drink alcohol or use illicit drugs.  Family / Support Systems Marital Status: Married How Long?: 21 yrs Patient Roles: Spouse, Parent Spouse/Significant Other: wife, Lanny Lipkin @ [phone] Children: son, Rodman Key, living in [place] Anticipated Caregiver: wife Ability/Limitations of Caregiver: Wife is not working and can provide supervision at home Caregiver Availability: 24/7 Family Dynamics: Wife very attentive and supporitve.  Encouraging to pt during interview.  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  Denies any s/s of depression/ anxiety.  Wife also denies any issues.   Recent Psychosocial Issues: None Pyschiatric History: None Substance Abuse History: None  Patient / Family Perceptions, Expectations & Goals Pt/Family understanding of illness & functional limitations: Pt and wife with basic understanding of the infection, abx course and current functional limitations/ need for CIR. Premorbid pt/family roles/activities: Pt was working until a couple of weeks PTA Anticipated changes in roles/activities/participation: Wife to provide any needed caregiver assistance Pt/family expectations/goals: "I just want to be able to do as much for myself as I can."  [employer]: None Premorbid Home Care/DME Agencies: None Transportation available at discharge: yes Resource referrals recommended: Neuropsychology  Discharge Planning Living Arrangements: Spouse/significant other Support Systems: Children, Spouse/significant other Type of Residence: Private residence Insurance underwriter Resources: Multimedia programmer (specify) Psychologist, counselling) Financial Resources: Employment Museum/gallery curator Screen Referred: No Living Expenses: Higher education careers adviser Management: Patient Does the patient have any problems obtaining your medications?: No Home Management: pt and wife share Patient/Family Preliminary Plans: Pt to return home with his wife who can provide 24/7 assistance Expected length of stay: 10-12 days  Clinical Impression Unfortunate gentleman here following back abscess/ conservative care with IV abx.  Wife very supportive and able to provide 24/7 assist.  No s/s of emotional distress reported/ noted.  Will follow for support and d/c planning needs.  Aldean Suddeth 10/27/2014, 3:10 PM

## 2014-10-30 NOTE — Progress Notes (Signed)
Recreational Therapy Assessment and Plan  Patient Details  Name: [name] MRN: [id number] Date of Birth: June 11, 1951 Today's Date: 10/30/2014  Rehab Potential: Good ELOS: 2 weeks   Assessment Clinical Impression:  Problem List:  Patient Active Problem List   Diagnosis Date Noted  . Ileus, postoperative 10/26/2014  . Left cervical radiculopathy 10/25/2014  . Cervical myelopathy 10/25/2014  . Thrombocytopenia 10/22/2014  . Group B streptococcal infection   . Abscess in epidural space of cervical spine   . Encephalopathy acute   . Streptococcal bacteremia   . NASH (nonalcoholic steatohepatitis)   . Other cirrhosis of liver   . Diabetes mellitus type 2, uncontrolled   . 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 UGIB (upper gastrointestinal bleed) 10/27/2012  . Anemia due to acute blood loss 10/27/2012  . Volume depletion 10/27/2012  . 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 GERD 03/28/2010  . CONSTIPATION 03/28/2010  . ABDOMINAL PAIN, GENERALIZED 03/28/2010  . ASCITES 06/13/2008  . PERSONAL HX COLONIC POLYPS 06/13/2008  . DM 09/27/2007  . OBESITY 09/27/2007  . PANCYTOPENIA 09/27/2007  . HYPERTENSION 09/27/2007  . INTERNAL HEMORRHOIDS 09/27/2007  . CIRRHOSIS 09/27/2007  . NEPHROLITHIASIS 09/27/2007  . IRON DEFICIENCY ANEMIA, HX OF 09/27/2007  . Personal history of other diseases of digestive system 09/27/2007  . COLONIC POLYPS, ADENOMATOUS 11/19/2006  . ESOPHAGEAL VARICES 11/19/2006  . DUODENITIS 11/19/2006  . ESOPHAGITIS, REFLUX 09/23/2004  . ESOPHAGEAL STRICTURE 09/23/2004  . Portal hypertension 09/23/2004  . DIVERTICULOSIS, COLON 08/14/2004    Past Medical History:  Past Medical History  Diagnosis Date  . Reflux esophagitis   . Portal hypertension   . Stricture and stenosis of esophagus   . Diverticulosis of colon  (without mention of hemorrhage)   . Internal hemorrhoids without mention of complication   . Benign neoplasm of colon   . Duodenitis without mention of hemorrhage   . 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 Personal history of diseases of blood and blood-forming organs   . Cirrhosis of liver without mention of alcohol   . Unspecified essential hypertension   . Obesity, unspecified   . Type II or unspecified type diabetes mellitus without mention of complication, not stated as uncontrolled   . GERD (gastroesophageal reflux disease)   . Hemorrhoids   . Colon polyps   . Pancytopenia   . Calculus of kidney yrs ago   Past Surgical History:  Past Surgical History  Procedure Laterality Date  . Kidney stone surgery  yrs ago  . Esophagogastroduodenoscopy  08/01/2011    Procedure: ESOPHAGOGASTRODUODENOSCOPY (EGD); Surgeon: Scarlette Shorts, MD; Location: [facility]; Service: Endoscopy; Laterality: N/A;  . Esophagogastroduodenoscopy N/A 10/27/2012    Procedure: ESOPHAGOGASTRODUODENOSCOPY (EGD); Surgeon: Inda Castle, MD; Location: [facility]; Service: Endoscopy; Laterality: N/A;  . Esophagogastroduodenoscopy N/A 11/22/2012    Procedure: ESOPHAGOGASTRODUODENOSCOPY (EGD); Surgeon: Irene Shipper, MD; Location: [facility]; Service: Endoscopy; Laterality: N/A;  . Colonoscopy    . Upper gastrointestinal endoscopy    . Esophagogastroduodenoscopy N/A 11/29/2013    Procedure: ESOPHAGOGASTRODUODENOSCOPY (EGD); Surgeon: Irene Shipper, MD; Location: [facility]; Service: Endoscopy; Laterality: N/A;  . Esophageal banding Bilateral 11/29/2013    Procedure: ESOPHAGEAL BANDING; Surgeon: Irene Shipper, MD; Location: [facility]; Service: Endoscopy; Laterality: Bilateral;  . Esophagogastroduodenoscopy N/A 06/13/2014    Procedure: ESOPHAGOGASTRODUODENOSCOPY (EGD); Surgeon: Irene Shipper, MD; Location: [facility]; Service: Endoscopy; Laterality: N/A;  . Esophageal banding N/A 06/13/2014    Procedure: ESOPHAGEAL BANDING; Surgeon: Irene Shipper, MD; Location:  [facility]; Service: Endoscopy; Laterality: N/A;    Assessment & Plan Clinical Impression: 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 He had blood cultures drawn by MD which was positive for gram-positive cocci. He was instructed to go to [facility] ED on 10/21/14 due to concerns of spine infection and was transferred to [facility] for evaluation. MRI cervical spine revealed epidural abscess with enhancing phlegmonous change from C1-C 6-7 with moderate to severe diffuse canal stenosis with flattening and displacement of cord, multifocal abscesses involving posterior paraspinous musculature predominantly on the left and abnormal dural enhancement extending superiorly through the foramen magnum. CT head negative for acute changes. Patient somnolent and encephalopathic with left sided neglect and quadriparesis at admission. Dr. Saintclair Halsted consulted for input and recommended management with IV antibiotics as extremely high surgical risk due to multiple comorbidities as well as coagulopathy.   Dr. Tommy Medal consulted and recommended adding IV ceftriaxone to IV Vancomycin for CNS penetration. CRP- 27.4 and sed rate 62. 2D echo with EF 65-70%, mild to moderate left atrial enlargement and mild aortic sclerosis without stenosis. TEE not done due to inability to manipulate neck. Repeat cultures positive for Group B streptococci and patient to continue on IV ceftriaxone 2 grams bid while hospitalized and change to 2 grams daily at discharge thorough June 9th for 8 weeks antibiotic therapy. Will need repeat MRI spine at 6-8 weeks with follow up with ID  in 4 weeks. Mentation improving with decrease in left inattention and improvement in LLE weakness. Therapy initiated and patient limited by unsteady gait, fatigue and diffuse weakness with poor coordination. Patient transferred to CIR on 10/25/2014 .      Pt presents with decreased activity tolerance, decreased functional mobility, decreased balance, decreased coordination, left inattention, decreased safety Limiting pt's independence with leisure/community pursuits.   Leisure History/Participation Premorbid leisure interest/current participation: Joretta Bachelor care;Petra Kuba - Flower gardening;Community - Grocery store;Community - Doctor, hospital - Travel (Comment) Other Leisure Interests: Television;Cooking/Baking (grilling) Leisure Participation Style: With Family/Friends Awareness of Community Resources: Good-identify 3 post discharge leisure resources Psychosocial / Spiritual Patient agreeable to Pet Therapy: Yes Does patient have pets?: Yes (dog, 2  cats) Social interaction - Mood/Behavior: Cooperative Academic librarian Appropriate for Education?: Yes Recreational Therapy Orientation Orientation -Reviewed with patient: Available activity resources Strengths/Weaknesses Patient Strengths/Abilities: Willingness to participate;Active premorbidly Patient weaknesses: Physical limitations TR Patient demonstrates impairments in the following area(s): Edema;Endurance;Motor;Pain;Safety;Sensory;Skin Integrity  Plan Rec Therapy Plan Is patient appropriate for Therapeutic Recreation?: Yes Rehab Potential: Good Treatment times per week: Min 1 time per week >20 minutes Estimated Length of Stay: 2 weeks TR Treatment/Interventions: Adaptive equipment instruction;1:1 session;Balance/vestibular training;Functional mobility training;Community reintegration;Patient/family education;Therapeutic activities;Recreation/leisure participation;Therapeutic exercise;UE/LE Coordination  activities;Visual/perceptual remediation/compensation  Recommendations for other services: None  Discharge Criteria: Patient will be discharged from TR if patient refuses treatment 3 consecutive times without medical reason.  If treatment goals not met, if there is a change in medical status, if patient makes no progress towards goals or if patient is discharged from hospital.  The above assessment, treatment plan, treatment alternatives and goals were discussed and mutually agreed upon: by patient  [place] 10/30/2014, 3:20 PM

## 2014-10-30 NOTE — Progress Notes (Signed)
[facility]     PROGRESS NOTE    Subjective/Complaints: Slept fairly well. Belly feels better but still tight. Having swelling in legs/scrotum. Foley in now. Left arm still weak.  Objective: Vital Signs: Blood pressure 119/66, pulse 68, temperature 98.1 F (36.7 C), temperature source Oral, resp. rate 18, height 5\' 10"  (1.778 m), weight 93.9 kg (207 lb 0.2 oz), SpO2 97 %. Dg Abd 1 View  10/28/2014   CLINICAL DATA:  64 year old male with a history of enema.  EXAM: ABDOMEN - 1 VIEW  COMPARISON:  10/27/2014, 10/26/2014  FINDINGS: Portions of the abdomen have been excluded, particularly the right abdomen.  There is gas through the length of the colon extending to the sigmoid colon. Small amount of gas within the rectum.  Right-sided small bowel loops are gas-filled.  Small calcification to the left of the L3 vertebral body, associated with the transverse process.  No displaced fracture.  No definite intraperitoneal air.  IMPRESSION: Gas through the length of the colon, extending to the rectum with no evidence of dilated colon to suggest obstruction.  Signed,  Dulcy Fanny. Earleen Newport, DO  [facility]  [facility]   Electronically Signed   By: Corrie Mckusick D.O.   On: 10/28/2014 09:14   No results for input(s): WBC, HGB, HCT, PLT in the last 72 hours. No results for input(s): NA, K, CL, GLUCOSE, BUN, CREATININE, CALCIUM in the last 72 hours.  Invalid input(s): CO CBG (last 3)   Recent Labs  10/29/14 1635 10/29/14 2112 10/30/14 0649  GLUCAP 239* 278* 223*    Wt Readings from Last 3 Encounters:  10/30/14 93.9 kg (207 lb 0.2 oz)  06/13/14 88.905 kg (196 lb)  02/07/14 89.812 kg (198 lb)    Physical Exam:  Nursing note and vitals reviewed. Constitutional: He is oriented to person, place, and time. He appears well-developed and well-nourished.  HENT:  Head: Normocephalic and atraumatic.  Eyes: Conjunctivae are  normal. Pupils are equal, round, and reactive to light.  Neck: Muscular tenderness present. Decreased range of motion present.     Cardiovascular: Normal rate and regular rhythm.  Respiratory: Effort normal and breath sounds normal. No respiratory distress. He has no wheezes.  GI: Abdomen softer but sill with ongoing ascites/ distention. Bowel sounds are improved. There is no tenderness.  Musculoskeletal: He exhibits edema (1++ edema pedally). Mild scrotal edema  Neck pain with ROM bilateral hips and LUE.  Neurological: He is alert and oriented to person, place, and time. Coordination abnormal.  Left inattention due to neck and shoulder pain limiting ROM.Marland Kitchen Anxious appearing but speech clear and is able to follow basic commands without difficulty. RUE 4+/5. L- deltoid 1+/5,1+ biceps, 3+ to 4- triceps, intrinsics 4+/5. BLE 3+/5 with ataxia. No obvious sensory loss to LT/P.  DTR's 1+  Skin: Skin is warm and dry.  Healing abrasions bilateral shins  Psychiatric: His speech is normal.  . He's alert Uro: foley in place, dry blood around meatus  Assessment/Plan: 1. Functional deficits secondary to cervical epidural abscess which require 3+ hours per day of interdisciplinary therapy in a comprehensive inpatient rehab setting. Physiatrist is providing close team supervision and 24 hour management of active medical problems listed below. Physiatrist and rehab team continue to assess barriers to discharge/monitor patient progress toward functional and medical goals. FIM: FIM - Bathing Bathing Steps Patient Completed: Chest, Abdomen, Front perineal area, Buttocks, Right upper leg, Left upper leg, Left Arm Bathing: 3: Mod-Patient  completes 5-7 13f 10 parts or 50-74%  FIM - Upper Body Dressing/Undressing Upper body dressing/undressing: 0: Wears gown/pajamas-no public clothing FIM - Lower Body Dressing/Undressing Lower body dressing/undressing steps patient completed: Thread/unthread left pants leg,  Pull pants up/down Lower body dressing/undressing: 2: Max-Patient completed 25-49% of tasks  FIM - Toileting Toileting: 1: Total-Patient completed zero steps, helper did all 3  FIM - Radio producer Devices: Grab bars Toilet Transfers: 3-From toilet/BSC: Mod A (lift or lower assist), 3-To toilet/BSC: Mod A (lift or lower assist)  FIM - Control and instrumentation engineer Devices: Walker, Bed rails, Arm rests Bed/Chair Transfer: 4: Sit > Supine: Min A (steadying pt. > 75%/lift 1 leg), 3: Chair or W/C > Bed: Mod A (lift or lower assist)  FIM - Locomotion: Ambulation Locomotion: Ambulation Assistive Devices: Walker - Rolling Ambulation/Gait Assistance: 4: Min assist Locomotion: Ambulation: 2: Travels 50 - 149 ft with minimal assistance (Pt.>75%)  Comprehension Comprehension Mode: Auditory Comprehension: 5-Follows basic conversation/direction: With no assist  Expression Expression Mode: Verbal Expression: 5-Expresses basic needs/ideas: With no assist  Social Interaction Social Interaction: 5-Interacts appropriately 90% of the time - Needs monitoring or encouragement for participation or interaction.  Problem Solving Problem Solving: 5-Solves complex 90% of the time/cues < 10% of the time  Memory Memory: 5-Recognizes or recalls 90% of the time/requires cueing < 10% of the time  Medical Problem List and Plan: 1. 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 DVT Prophylaxis/Anticoagulation: Mechanical:  Antiembolism stockings, knee (TED hose) Bilateral lower extremities Sequential compression devices, below knee Bilateral lower extremities 3. Pain Management: Duragesic patch 12.mcgt. Decrease oxycodone to 5mg  prn  given neurosedation. 4. Mood: Team to provide ego support. .  5. Neuropsych: This patient is capable of making decisions on his own behalf. 6. Skin/Wound Care: Routine pressure relief measures. Maintain adequate nutritional support.  7. Fluids/Electrolytes/Nutrition: Monitor I/O.  Marland Kitchen  8. 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 Ascites, abdominal distention on exam   -add spironolactone to help with ascites/edema---may be worthwhile to have GI see also 9. Thrombocytopenia: Stable at 26 on most recent check.   10. DM type 2: Hgb A1c- 8.7. Monitor BS with ac/hs checks and will use SSI for elevated BS. BS are slowly trending upwards with improvement in po intake. resumed Tradjenta as was on Invokana and Tradjenta at home.  11. Reactive leucocytosis: Slowly resolving with treatment of infection.  12. Ileus: + results with SSE/program.  .  .  -change to PO reglan 13. Urine retention: foley (with diuretic on board)  -ucx negative    LOS (Days) 5 A FACE TO FACE EVALUATION WAS PERFORMED  [name] 10/30/2014 7:37 AM

## 2014-10-30 NOTE — Progress Notes (Signed)
Physical Therapy Session Note  Patient Details  Name: [name] MRN: [id number] Date of Birth: [birth year]  Today's Date: 10/30/2014 PT Individual Time: 1110-1150 and 1353-1413 PT Individual Time Calculation (min): 40 min and 20 min  Short Term Goals: Week 1:  PT Short Term Goal 1 (Week 1): Patient will perform supine <> sit with supervision PT Short Term Goal 2 (Week 1): Patient will perform bed to wheelchair transfer with supervision. PT Short Term Goal 3 (Week 1): Patient will ambulate 50 feet with rolling walker and min assist PT Short Term Goal 4 (Week 1): Patient will propel wheelchair 150 feet in controlled environment PT Short Term Goal 5 (Week 1): Patient will ambulate up and down 8 steps with bilateral railings and min assist.  Skilled Therapeutic Interventions/Progress Updates:   Session 1: Focus on functional mobility training, strengthening, and activity tolerance. Patient received semi reclined in bed, wife present for session. With increased time, patient transferred supine > sit with HOB elevated and use of rail with min A to elevate trunk. Performed stand pivot transfers using RW with min A and mod verbal cues for safe hand placement and keeping RW closer to body. Patient instructed in propelling wheelchair using BLE and RUE x 50 ft with mod A for downward approximation through LLE and verbal cues for sequencing/technique. Patient performed sit <> stand from wheelchair using RW with cues for safe hand placement, x 5 with S-min guard and increased time. Gait using RW x 50 ft + 85 ft with min guard and verbal/tactile cues for upright posture and AD management, +2 for IV pole and w/c follow. Patient with intermittent LE buckling but no LOB noted. Patient returned to bed with min A and BLE and LUE elevated on pillows, wife in room and transport present to take patient to ultrasound.   Session 2: Session focused on strengthening and positioning. Patient received in bed, wife present.  Patient reporting fatigue but agreeable to bedside therex for BLE, each exercise to fatigue: heel slides, glute sets, quad sets, assisted SLR. Repositioned higher in bed with bridging and therapist stabilizing BLE with use of RUE, BLE and LUE elevated on pillows. Patient left semi reclined in bed with call bell within reach and wife in room.   Therapy Documentation Precautions:  Precautions Precautions: Fall Restrictions Weight Bearing Restrictions: No Vital Signs: Therapy Vitals Temp: 98.1 F (36.7 C) Temp Source: Oral Pulse Rate: (!) 56 BP: 112/61 mmHg Patient Position (if appropriate): Lying Oxygen Therapy SpO2: 98 % O2 Device: Not Delivered Pain:  Pain Assessment Pain Assessment: 0-10 Pain Score: 7  Pain Type: Acute pain Pain Location: Leg Pain Orientation: Left Pain Descriptors / Indicators: Aching Pain Onset: On-going Pain Intervention(s): RN made aware;Repositioned 2nd Pain Site Pain Score: 7 Pain Type: Acute pain Pain Orientation: Left Pain Descriptors / Indicators: Aching Pain Onset: On-going Pain Intervention(s): Repositioned;RN made aware Locomotion : Ambulation Ambulation/Gait Assistance: 4: Min assist   See FIM for current functional status  Therapy/Group: Individual Therapy  Laretta Alstrom 10/30/2014, 11:59 AM

## 2014-10-30 NOTE — Consult Note (Signed)
[facility] Consult: 2:07 PM 10/30/2014  LOS: 5 days    Referring Provider: Dr. Alger Simons of inpatient rehabilitation Primary Care Physician:  [name], MD Primary Gastroenterologist:  Dr. Scarlette Shorts .  Previously Lockie Mola MD, now Enis Gash MD and Emilio Math NP at [facility]   Reason for Consultation:  Assistance with management of ascites in known cirrhotic.   HPI: [name] is a 64 y.o. male.  PMH type 2 diabetes. 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  He has undergone innumerable upper endoscopies with esophageal variceal banding and findings of portal gastropathy.  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  Many of his previous EGDs and colonoscopies are noted below. 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   MELD-Na was 17 (Na 133), at bi-annual [facility] hepatology visit 10/12/2013. No evidence of ascites on exam or doppler ultrasound of 10/13/14 or MRI in 03/2014.  Weight was 84.4 kg (186#)  Was asked to follow 2 gm NA diet and he does his best to comply.   Patient currently on [facility] rehab unit following a 10/20/2014 - 10/25/2014 inpatient stay for management of cervical, group B strep, epidural abscess treated with antibiotics (Rocephin for at least 8 weeks).  There was nothing safe to drain.  During the admission he had acute encephalopathy which resolved.  Ammonia level 56 on 10/26/14.   Staph on rehabilitation is concerned at increasing abdominal girth.  Weight now is up to 93.9 kg.   KUB 10/25/2014 showed multiple air filled loops of small bowel but no obstruction. Radiologist raised concern that this appearance could be  seen in enteritis or potentially from intermittent obstruction.  However on follow-up 10/26/2014 KUB the small bowel appearance showed decreased distention.   KUB 10/28/2014 showed gas throughout the colon but no dilated colon.  Suspicion that the distention was due to ascites was raised when bladder scan showed up to 700 mL of retained fluid but on catheterization only 200 mL of urine was extracted.  And the ultrasound today confirms presence of ascites.   Patient had not recently been on diuretics. He had undergone paracentesis in 2009 and in 2014.  Spironolactone 25 mg was ordered this morning, but on verbal suggestion from GI, dose was upped to 50 mg daily and 20 mg Lasix added.  His NA is 130 today,    Reviewing medication it is noted he is taking oxycodone several times daily.  Had been getting MiraLAX 3 times daily but this was decreased to twice daily today.  He had not been taking MiraLAX prior to or/15/2016.  Patient has been on propanolol chronically for a few years. Patient had been receiving Reglan IV 3-4 times daily but it was switched to TID oral Reglan today He is also taking once daily Protonix  Appetite is good.  No nausea.  No current or historic problems with acid reflux or with  dysphagia. On rehab has been receiving daily enemas for constipation.  Large BM yesterday, smaller BM today.  Taking vicodin for neck/spine pain.   ENDOSCOPIC STUDIES: 06/13/2014 EGD ENDOSCOPIC IMPRESSION: 1. Distal esophageal scarring and small mid esophageal varices. No banding required 2. Portal gastropathy (moderate) RECOMMENDATIONS: 1. Routine office appointment with [name] in 6 months 2. Repeat upper Endoscopy with possible banding in 1 year  11/2013 EGD relook study inpatient status post variceal band ligation 10/2012 ENDOSCOPIC IMPRESSION: 1.The esophagus revealed scarring and stricturing from prior examination therapy. No obvious bandable varices or high-risk stigmata. 2. Mild portal  gastropathy RECOMMENDATIONS: 1.Continue current medications 2. Keep appointment at [facility] next month 3. GI office followup with [name] in 3 months. 4. Relook endoscopy in 6 months  10/27/2012 EGD for evaluation of hematemesis 2 columns of varices in the distal third of the esophagus. The were and there was evidence of a red wale sign. There was a red wale sign on a single varix. 2 bands were applied to the varix. There was an initial blush of bleeding that subsided. 3 other bands were applied to the remaining varices. The remainder of the upper endoscopy exam was otherwise normal. there was old blood in the stomach. Retroflexed view of the cardia revealed no abnormalities. ENDOSCOPIC IMPRESSION: 1. bleeding from esophageal varices-status post band ligation RECOMMENDATIONS: continue PPI therapy and octreotide, Rocephin  03/31/2012 Colonoscopy for history of adenomatous colon polyps. High-grade dysplasia in 2004, tubular adenomas in 2006, 2008 Tubulovillous adenoma of the ascending colon.  Portal hypertensive colopathy.  08/01/2011 EGD surveillance study inpatient status post esophageal variceal banding 12/2010 ENDOSCOPIC IMPRESSION: 1) Partial Grade I varix in the distal esophagus. Nothing amenable to banding 2) Portal Hypertensive gastropathy in the body of the stomach 3) Otherwise normal examination  EGD with esophageal varices with banding and portal gastropathy date back to at least 2006. GI bleeding felt due to esophageal varices dates back to 2006.  11/04/2012 ultrasound with 1.6 L paracentesis.  No SBP 05/2008  ultrasound with 800 mL paracentesis. No SBP.     Past Medical History  Diagnosis Date  . Reflux esophagitis   . Portal hypertension   . Stricture and stenosis of esophagus   . Diverticulosis of colon (without mention of hemorrhage)   . Internal hemorrhoids without mention of complication   . Benign neoplasm of colon   . Duodenitis without mention of  hemorrhage   . 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 Personal history of diseases of blood and blood-forming organs   . Cirrhosis of liver without mention of alcohol   . Unspecified essential hypertension   . Obesity, unspecified   . Type II or unspecified type diabetes mellitus without mention of complication, not stated as uncontrolled   . GERD (gastroesophageal reflux disease)   . Hemorrhoids   . Colon polyps 2004, 2006, 2008, 11/2011    Adenomatous and tubulovillous adenoma.  . Pancytopenia   . Calculus of kidney yrs ago    Past Surgical History  Procedure Laterality Date  . Kidney stone surgery  yrs ago  . Esophagogastroduodenoscopy  08/01/2011    Procedure: ESOPHAGOGASTRODUODENOSCOPY (EGD);  Surgeon: Scarlette Shorts, MD;  Location: [facility];  Service: Endoscopy;  Laterality: N/A;  . Esophagogastroduodenoscopy N/A 10/27/2012    Procedure: ESOPHAGOGASTRODUODENOSCOPY (EGD);  Surgeon: Inda Castle, MD;  Location: [facility];  Service: Endoscopy;  Laterality: N/A;  . Esophagogastroduodenoscopy N/A 11/22/2012    Procedure: ESOPHAGOGASTRODUODENOSCOPY (EGD);  Surgeon: Irene Shipper, MD;  Location: [facility] ENDOSCOPY;  Service: Endoscopy;  Laterality: N/A;  . Colonoscopy    . Upper gastrointestinal endoscopy    . Esophagogastroduodenoscopy N/A 11/29/2013    Procedure: ESOPHAGOGASTRODUODENOSCOPY (EGD);  Surgeon: Irene Shipper, MD;  Location: [facility];  Service: Endoscopy;  Laterality: N/A;  . Esophageal banding Bilateral 11/29/2013    Procedure: ESOPHAGEAL BANDING;  Surgeon: Irene Shipper, MD;  Location: [facility]COPY;  Service: Endoscopy;  Laterality: Bilateral;  . Esophagogastroduodenoscopy N/A 06/13/2014    Procedure: ESOPHAGOGASTRODUODENOSCOPY (EGD);  Surgeon: Irene Shipper, MD;  Location: [facility];  Service: Endoscopy;  Laterality: N/A;  . Esophageal banding N/A 06/13/2014    Procedure: ESOPHAGEAL BANDING;  Surgeon: Irene Shipper, MD;  Location: [facility]OSCOPY;  Service:  Endoscopy;  Laterality: N/A;    Prior to Admission medications   Medication Sig Start Date End Date Taking? Authorizing Provider  canagliflozin (INVOKANA) 100 MG TABS tablet Take 100 mg by mouth daily.    Historical Provider, MD  cetirizine (ZYRTEC) 10 MG tablet Take 10 mg by mouth daily as needed for allergies.  01/19/14   Historical Provider, MD  doxycycline (VIBRA-TABS) 100 MG tablet Take 100 mg by mouth 2 (two) times daily. 10/19/14   Historical Provider, MD  Dulaglutide 0.75 MG/0.5ML SOPN Inject 0.75 mg into the skin once a week.    Historical Provider, MD  gabapentin (NEURONTIN) 100 MG capsule Take 100 mg by mouth daily.  10/19/14   Historical Provider, MD  INVOKANA 300 MG TABS tablet Take 300 mg by mouth daily. 10/08/14   Historical Provider, MD  JANUVIA 100 MG tablet Take 100 mg by mouth daily. 09/09/14   Historical Provider, MD  linagliptin (TRADJENTA) 5 MG TABS tablet Take 5 mg by mouth every morning.     Historical Provider, MD  LIVER EXTRACT PO Take by mouth daily.    Historical Provider, MD  Magnesium 250 MG TABS Take 1 tablet by mouth every morning.     Historical Provider, MD  milk thistle 175 MG tablet Take 175 mg by mouth every morning.     Historical Provider, MD  pantoprazole (PROTONIX) 40 MG tablet Take 1 tablet (40 mg total) by mouth daily. Patient taking differently: Take 40 mg by mouth every morning.     Irene Shipper, MD  Potassium 99 MG TABS Take 1 tablet by mouth every morning.     Historical Provider, MD  propranolol (INDERAL) 20 MG tablet TAKE 1 TABLET (20 MG TOTAL) BY MOUTH DAILY. 04/21/14   Irene Shipper, MD  Vitamin D, Ergocalciferol, (DRISDOL) 50000 UNITS CAPS Take 50,000 Units by mouth every 7 (seven) days.     Historical Provider, MD    Scheduled Meds: . cefTRIAXone (ROCEPHIN)  IV  2 g Intravenous Q12H  . feeding supplement (PRO-STAT SUGAR FREE 64)  30 mL Oral BID  . furosemide  20 mg Oral Daily  . glimepiride  2 mg Oral Q breakfast  . insulin aspart  0-15 Units  Subcutaneous TID WC  . insulin aspart  0-5 Units Subcutaneous QHS  . lidocaine  2 patch Transdermal Q24H  . linagliptin  5 mg Oral Daily  . metoCLOPramide  5 mg Oral TID AC  . pantoprazole  40 mg Oral q morning - 10a  . polyethylene glycol  17 g Oral BID  . propranolol  20 mg Oral q morning - 10a  . simethicone  160 mg Oral QID  . [START ON 10/31/2014] spironolactone  50 mg Oral Daily  .  Vitamin D (Ergocalciferol)  50,000 Units Oral Q Mon   Infusions:   PRN Meds: sodium chloride, acetaminophen, alum & mag hydroxide-simeth, bisacodyl, diphenhydrAMINE, guaiFENesin-dextromethorphan, methocarbamol, morphine, naLOXone (NARCAN)  injection, ondansetron **OR** ondansetron (ZOFRAN) IV, oxyCODONE, sodium phosphate, traZODone   Allergies as of 10/25/2014  . (No Known Allergies)    Family History  Problem Relation Age of Onset  . Colon cancer Neg Hx   . Anesthesia problems Neg Hx   . Hypotension Neg Hx   . Malignant hyperthermia Neg Hx   . Pseudochol deficiency Neg Hx   . Diabetes Father   . Heart disease Father   . Aneurysm Mother   . Heart disease Other     neice  . Diabetes      niece    History   Social History  . Marital Status: Married    Spouse Name: N/A  . Number of Children: N/A  . Years of Education: N/A   Occupational History  . Not on file.   Social History Main Topics  . Smoking status: Never Smoker   . Smokeless tobacco: Never Used  . Alcohol Use: No     Comment: very rare  . Drug Use: No  . Sexual Activity: No   Other Topics Concern  . Not on file   Social History Narrative    REVIEW OF SYSTEMS: Constitutional:  Weight on 4/8 was 84.4 kg, now it is 93.9 kg. Very weak, only able to walk with walker with PT holding him up with belt short distances.  Tires out very quickly.   ENT:  No nose bleeds Pulm:  Some DOE but no cough or resting dyspnea.  CV:  No palpitations, no LE edema.  GU:  No hematuria, no frequency GI:  Per HPI Heme:  No recent issues  with excessive bleeding or bruising   Transfusions:  None recently.  Only once in past did he get PRBCs Neuro:  No headaches, no peripheral tingling or numbness.  Mild "brain fog" but no overt confusion.  Derm:  No itching, no rash or sores.  Endocrine:  No sweats or chills.  No polyuria or dysuria Immunization:  Up to date on numerous vaccines.  Completed Hep a and B vaccines Travel:  None beyond local counties in last few months.    PHYSICAL EXAM: Vital signs in last 24 hours: Filed Vitals:   10/30/14 1401  BP: 112/61  Pulse: 56  Temp: 98.1 F (36.7 C)  Resp:    Wt Readings from Last 3 Encounters:  10/30/14 207 lb 0.2 oz (93.9 kg)  06/13/14 196 lb (88.905 kg)  02/07/14 198 lb (89.812 kg)    General: pale and tired but not seriously ill looking WM.  Comfortable, alert  Head:  No swelling, assymetry or signs of trauma.    Eyes:  No icterus or conj pallor Ears:  Not HOH  Nose:  No discharge Mouth:  Clear, moist oral MM.  Lips very red.  Upper full dentures.  Multiple lowermolars missing. Neck:  No mass, noJVD.  No TMG Lungs:  Clear bil.  No cough.  Slightly dyspneic with effort and prolonged speach Heart: RRR, soft 1/6 SM Abdomen:  Soft, distended, NT, tinkling/tympanitic BS.  No mass or HSM.   Rectal: deferred.    Musc/Skeltl: no joint swelling or erythema.  Extremities:  2+LE edema  Neurologic:  Oriented x 3.  No asterixis. Weakness in legs.  Moves all 4s.  Skin:  No telangectasia or rash.  Nodes:  No cervical adenopathy.    Psych:  Pleasant, cooperative.  Relaxed.   Intake/Output from previous day: 04/24 0701 - 04/25 0700 In: 600 [P.O.:600] Out: 1325 [Urine:1325] Intake/Output this shift: Total I/O In: 240 [P.O.:240] Out: -   LAB RESULTS: No results for input(s): WBC, HGB, HCT, PLT in the last 72 hours. BMET Lab Results  Component Value Date   NA 130* 10/27/2014   NA 131* 10/26/2014   NA 132* 10/23/2014   K 4.1 10/27/2014   K 4.2 10/26/2014   K 3.7  10/23/2014   CL 96 10/27/2014   CL 97 10/26/2014   CL 98 10/23/2014   CO2 27 10/27/2014   CO2 25 10/26/2014   CO2 22 10/23/2014   GLUCOSE 163* 10/27/2014   GLUCOSE 199* 10/26/2014   GLUCOSE 125* 10/23/2014   BUN 12 10/27/2014   BUN 14 10/26/2014   BUN 17 10/23/2014   CREATININE 0.59 10/27/2014   CREATININE 0.67 10/26/2014   CREATININE 0.70 10/23/2014   CALCIUM 8.1* 10/27/2014   CALCIUM 8.3* 10/26/2014   CALCIUM 8.1* 10/23/2014   LFT No results for input(s): PROT, ALBUMIN, AST, ALT, ALKPHOS, BILITOT, BILIDIR, IBILI in the last 72 hours. PT/INR Lab Results  Component Value Date   INR 1.38 10/21/2014   INR 1.5* 02/23/2013   INR 1.37 10/27/2012    RADIOLOGY STUDIES: 10/30/14 Abdominal ultrasound FINDINGS: Ascites is present in all 4 quadrants of the abdomen.  10/13/2014 Doppler ultrasound of abdomen Impression: 1. Blood flow in the normal direction in the major hepatic blood vessels. 2. Right liver lobe nodule seen on prior ultrasound from September 2015 and not seen on follow-up up MRI, is not definitely identified in current study.  3. Large gallstone in a contracted gallbladder, unchanged.  4. Simple cyst, measuring 1.3 x 1.0 x 1.2 cm, within the right mid right kidney. Anechoic cystic lesion in the inferior right kidney, with at least 2 septations measuring 1.6 x 1.9 x 1.6 cm. 5. Enlarged spleen.  04/05/2014 MRI abdomen IMPRESSION: 1. Confluent hepatic fibrosis involving hepatic segments V and VIII without focal liver lesion. 2. Nonocclusive thrombus in the central SMV. 3. Morphologic changes of cirrhosis with findings of portal hypertension, including splenomegaly, periumbilical venous collaterals and esophageal varices.     IMPRESSION:   *  Long-standing cirrhosis due to [place].  Up-to-date with MRI screening for [place].  AFP level 10/13/2014 8.8.  Received A/hep B vaccination series in 2015.  4/16 PT/INR 17/1.3 so doing well from synthetic parameter.   *   Ascites, confirmed on today's ultrasound.  Had not been present on doppler US of 4/8, or seen on MRI in 03/2014.   Weight on 4/8 was 84.4 kg, now it is 93.9 kg.   *  Long-standing history of esophageal varices, portal hypertension and portal colopathy. Note incidence of upper GI bleeding in 2006.  On chronic propranolol.  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 On prolonged course of Rocephin   *  Type 2 diabetes. On multiple oral agents prior to admission    PLAN:     *  Paracentesis with studies to rule out SBP.  *  Goal diuretics of Aldactone 100 mg daily, Lasix 40 mg daily. Currently at 61 and 20, but just started today. With BPs today in 1teens/60s and Na of 130 will need to titrate as tolerated.  *  Wonder if we should replace Miralax with Lactulose, as this could perform double duty for constipation and as  prophylaxis of hepatic encephalopathy?    *  Follow BMET   Azucena Freed  10/30/2014, 2:07 PM Pager: [phone]

## 2014-10-30 NOTE — Progress Notes (Addendum)
Lasix added to help with edema control per GI input and recheck lytes in am.  Patient's po intake has improved and blood sugars are trending upwards.  Will add amaryl for better control.

## 2014-10-31 ENCOUNTER — Inpatient Hospital Stay (HOSPITAL_COMMUNITY): Payer: Managed Care, Other (non HMO)

## 2014-10-31 ENCOUNTER — Other Ambulatory Visit (HOSPITAL_COMMUNITY): Payer: Managed Care, Other (non HMO)

## 2014-10-31 ENCOUNTER — Inpatient Hospital Stay (HOSPITAL_COMMUNITY): Payer: Managed Care, Other (non HMO) | Admitting: *Deleted

## 2014-10-31 ENCOUNTER — Inpatient Hospital Stay (HOSPITAL_COMMUNITY): Payer: Managed Care, Other (non HMO) | Admitting: Physical Therapy

## 2014-10-31 ENCOUNTER — Inpatient Hospital Stay (HOSPITAL_COMMUNITY): Payer: Managed Care, Other (non HMO) | Admitting: Speech Pathology

## 2014-10-31 DIAGNOSIS — R188 Other ascites: Secondary | ICD-10-CM

## 2014-10-31 LAB — BODY FLUID CELL COUNT WITH DIFFERENTIAL
Lymphs, Fluid: 25 %
MONOCYTE-MACROPHAGE-SEROUS FLUID: 57 % (ref 50–90)
Neutrophil Count, Fluid: 18 % (ref 0–25)
WBC FLUID: 159 uL (ref 0–1000)

## 2014-10-31 LAB — BASIC METABOLIC PANEL
Anion gap: 5 (ref 5–15)
BUN: 12 mg/dL (ref 6–23)
CO2: 28 mmol/L (ref 19–32)
Calcium: 8.1 mg/dL — ABNORMAL LOW (ref 8.4–10.5)
Chloride: 95 mmol/L — ABNORMAL LOW (ref 96–112)
Creatinine, Ser: 0.5 mg/dL (ref 0.50–1.35)
GFR calc non Af Amer: >90 mL/min (ref >90)
Glucose, Bld: 224 mg/dL — ABNORMAL HIGH (ref 70–99)
POTASSIUM: 4.7 mmol/L (ref 3.5–5.1)
Sodium: 128 mmol/L — ABNORMAL LOW (ref 135–145)

## 2014-10-31 LAB — GLUCOSE, CAPILLARY
Glucose-Capillary: 192 mg/dL — ABNORMAL HIGH (ref 70–99)
Glucose-Capillary: 266 mg/dL — ABNORMAL HIGH (ref 70–99)
Glucose-Capillary: 183 mg/dL — ABNORMAL HIGH (ref 70–99)
Glucose-Capillary: 200 mg/dL — ABNORMAL HIGH (ref 70–99)

## 2014-10-31 LAB — ALBUMIN, FLUID (OTHER): Albumin, Fluid: <1 g/dL

## 2014-10-31 MED ORDER — GLIMEPIRIDE 4 MG PO TABS
4.0000 mg | ORAL_TABLET | Freq: Every day | ORAL | Status: DC
  Administered 2014-10-31 – 2014-11-06 (×7): 4 mg via ORAL
  Filled 2014-10-31 (×8): qty 1

## 2014-10-31 MED ORDER — LACTULOSE 10 GM/15ML PO SOLN
20.0000 g | Freq: Two times a day (BID) | ORAL | Status: DC
  Administered 2014-10-31 – 2014-11-11 (×23): 20 g via ORAL
  Filled 2014-10-31 (×25): qty 30

## 2014-10-31 MED ORDER — LIDOCAINE HCL (PF) 1 % IJ SOLN
INTRAMUSCULAR | Status: AC
  Filled 2014-10-31: qty 10

## 2014-10-31 NOTE — Progress Notes (Signed)
Daily Rounding Note  10/31/2014, 8:21 AM  LOS: 6 days   SUBJECTIVE:       No BM yesterday.  No nausea.  Good appetite and po intake.  Waiting on paracentesis.  Feels sleepy. No confusion.   OBJECTIVE:         Vital signs in last 24 hours:    Temp:  [98.1 F (36.7 C)-98.6 F (37 C)] 98.6 F (37 C) (04/26 0537) Pulse Rate:  [56-62] 62 (04/26 0537) Resp:  [17-18] 17 (04/26 0537) BP: (112-132)/(61-77) 132/77 mmHg (04/26 0537) SpO2:  [97 %-98 %] 97 % (04/26 0537) Weight:  [205 lb 14.6 oz (93.4 kg)] 205 lb 14.6 oz (93.4 kg) (04/26 0537) Last BM Date: 10/30/14 Filed Weights   10/28/14 0551 10/30/14 0607 10/31/14 0537  Weight: 207 lb 10.8 oz (94.2 kg) 207 lb 0.2 oz (93.9 kg) 205 lb 14.6 oz (93.4 kg)   General: looks somewhat ill, not toxic though.    Heart: RRR Chest: reduced basilar BS but clear.  No cough or labored breathing.  Abdomen: tight, distended.  Tinkling BS.  NT just feels pressure/tightness to exam  Extremities: 2 + pedal/LE edema.  Neuro/Psych:  Oriented x 3, no asterixis, somewhat somnolent but arouses easily.   Intake/Output from previous day: 04/25 0701 - 04/26 0700 In: 370 [P.O.:360; I.V.:10] Out: 900 [Urine:900]  Intake/Output this shift:    Lab Results: No results for input(s): WBC, HGB, HCT, PLT in the last 72 hours. BMET  Recent Labs  10/30/14 1610 10/31/14 0545  NA 127* 128*  K 4.4 4.7  CL 95* 95*  CO2 27 28  GLUCOSE 288* 224*  BUN 11 12  CREATININE 0.62 0.50  CALCIUM 8.0* 8.1*   LFT  Recent Labs  10/30/14 1610  PROT 6.4  ALBUMIN 1.7*  AST 29  ALT 16  ALKPHOS 88  BILITOT 1.4*    Studies/Results: US Abdomen Limited  10/30/2014   CLINICAL DATA:  Evaluate for ascites. Cirrhosis. Abdominal distention  EXAM: LIMITED ABDOMEN ULTRASOUND FOR ASCITES  TECHNIQUE: Limited ultrasound survey for ascites was performed in all four abdominal quadrants.  COMPARISON:  None.  FINDINGS:  Ascites is present in all 4 quadrants of the abdomen.  IMPRESSION: Ascites is present.   Electronically Signed   By: Rolla Flatten M.D.   On: 10/30/2014 13:10   \ Scheduled Meds: . cefTRIAXone (ROCEPHIN)  IV  2 g Intravenous Q12H  . feeding supplement (PRO-STAT SUGAR FREE 64)  30 mL Oral BID  . furosemide  20 mg Oral Daily  . glimepiride  4 mg Oral Q breakfast  . insulin aspart  0-15 Units Subcutaneous TID WC  . insulin aspart  0-5 Units Subcutaneous QHS  . lidocaine  2 patch Transdermal Q24H  . linagliptin  5 mg Oral Daily  . pantoprazole  40 mg Oral q morning - 10a  . polyethylene glycol  17 g Oral BID  . propranolol  20 mg Oral q morning - 10a  . simethicone  160 mg Oral QID  . spironolactone  50 mg Oral Daily  . Vitamin D (Ergocalciferol)  50,000 Units Oral Q Mon   Continuous Infusions:  PRN Meds:.sodium chloride, acetaminophen, alum & mag hydroxide-simeth, bisacodyl, diphenhydrAMINE, guaiFENesin-dextromethorphan, methocarbamol, morphine, naLOXone (NARCAN)  injection, ondansetron **OR** ondansetron (ZOFRAN) IV, oxyCODONE, sodium chloride, sodium phosphate, traZODone   ASSESMENT:   * Long-standing cirrhosis due to [place]. Up-to-date with MRI screening for [place]. AFP level 10/13/2014 8.8.  Received A/hep B vaccination series in 2015.  4/16 PT/INR 17/1.3 so doing well from synthetic parameter.   * Ascites.  Paracentesis planned for today.  Low dose aldactone/lasix initiated 4/25.    *  Hyponatremia.  Need to monitor closely since now on diuretics. Trending down.    * Long-standing history of esophageal varices, portal hypertension and portal colopathy.  Up to date with screening EGD as of 06/2014 (no banding required). On chronic propranolol.  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 On prolonged course of Rocephin.  Taking oxycodone for neck and left shoulder pain.   *  Constipation.    * Type 2 diabetes. On multiple oral agents prior to admission   PLAN   *   Paracentesis today.   *  Add Lactulose in place of Miralax for constipation (due to opioids) and to cover for possible early HE ( sleepy/tired)    [name]  10/31/2014, 8:21 AM Pager: [phone]

## 2014-10-31 NOTE — Progress Notes (Signed)
Patient arrived back to unit and nursing staff wasn't available to check CBG before patient ate.  Notified Pam Love, PA and she recommends holding any insulin and just rechecking blood sugar at dinner time.  Brita Romp, RN

## 2014-10-31 NOTE — Progress Notes (Signed)
Recreational Therapy Session Note  Patient Details  Name: [name] MRN: [id number] Date of Birth: [birth year] Today's Date: 10/31/2014  Skilled Therapeutic Interventions/Progress Updates: Pt initially declined OOB participation, but later agreed to get up into recliner.  Pt then stated need to have a BM so assisted pt to bathroom.  Pt ambulated with RW with min assist +2 for IV pole management.    Therapy/Group: Co-Treatment Fumiye Lubben 10/31/2014, 4:02 PM

## 2014-10-31 NOTE — Progress Notes (Signed)
Speech Language Pathology Daily Session Note  Patient Details  Name: [name] MRN: [id number] Date of Birth: 1951-04-26  Today's Date: 10/31/2014 SLP Individual Time: 0800-0900 SLP Individual Time Calculation (min): 60 min  Short Term Goals: Week 1: SLP Short Term Goal 1 (Week 1): Pt will utilize external/internal aids to facilitate recall of new information during functional tasks with Min cues SLP Short Term Goal 1 - Progress (Week 1): Progressing toward goal SLP Short Term Goal 2 (Week 1): Pt will demonstrate mildly complex problem solving to complete functional tasks with Min cues SLP Short Term Goal 2 - Progress (Week 1): Progressing toward goal  Skilled Therapeutic Interventions: Skilled treatment session focused on cognitive goals. SLP facilitated session by providing Mod A question cues for recall of his current medications and their functions and Supervision verbal cues for functional problem solving in regards to organizing a pill box. Patient recalled events from morning therapy session with supervision question cues and completed a written organization calendar task with Min A question cues to self-monitor and correct errors. Patient left in wheelchair with all needs within reach. Continue with current plan of care.    FIM:  Comprehension Comprehension Mode: Auditory Comprehension: 4-Understands basic 75 - 89% of the time/requires cueing 10 - 24% of the time Expression Expression Mode: Verbal Expression: 4-Expresses basic 75 - 89% of the time/requires cueing 10 - 24% of the time. Needs helper to occlude trach/needs to repeat words. Social Interaction Social Interaction: 4-Interacts appropriately 75 - 89% of the time - Needs redirection for appropriate language or to initiate interaction. Problem Solving Problem Solving: 4-Solves basic 75 - 89% of the time/requires cueing 10 - 24% of the time Memory Memory: 5-Recognizes or recalls 90% of the time/requires cueing < 10% of  the time  Pain Pain Assessment Pain Assessment: 0-10 Pain Score: 4  Pain Location: Back Patients Stated Pain Goal: 5 Pain Intervention(s): Medication (See eMAR)  Therapy/Group: Individual Therapy  [name] 10/31/2014, 3:49 PM

## 2014-10-31 NOTE — Progress Notes (Signed)
Occupational Therapy Session Note  Patient Details  Name: [name] MRN: [id number] Date of Birth: 02/13/1951  Today's Date: 10/31/2014 OT Individual Time: 0700-0800 and 1400-1443 OT Individual Time Calculation (min): 60 min and 43 min     Short Term Goals: Week 1:  OT Short Term Goal 1 (Week 1): Pt will complete LB dressing at supervision level OT Short Term Goal 2 (Week 1): Pt will complete toilet transfer at supervision level OT Short Term Goal 3 (Week 1): Pt will complete functional activity in standing for 3 min to increase activity tolerance  Skilled Therapeutic Interventions/Progress Updates:    Session 1: Pt seen for ADL retraining with focus on functional transfers, standing balance, LUE NMR, and activity tolerance. Pt received supine in bed asleep. Pt required mod multimodal cues to arouse and remained arouse. Completed supine>sit with min A and min cues for sequencing. Completed stand pivot transfer bed>w/c with min A using RW and manual facilitation for upright posture. Completed bathing sit<>stand at sink with min A sit<>stand and standing balance. Provided hand over hand assist to LUE for washing RUE with pt actively assisting 50%. Pt requesting to don gown due to discomfort in his clothing at this time. Pt required mod cues for hemi dressing technique with LUE to don gown. Pt required total A to don socks with pt assisting with managing around R ankle. Completed oral care in standing with steadying assist for balance and pt fatiguing quickly. Provided scapular mobilization to LUE as pt noted to have posterior subluxation. Also provided Giv Mohr sling and half lap tray to assist with positioning during and between therapies. At end of session pt left sitting in w/c with RN present.   Session 2: Pt seen for 1:1 OT session with focus on functional transfers, standing balance, and activity tolerance. Pt received sitting in w/c requesting to toilet. Completed stand pivot transfer  w/c<>toilet with min A using RW and mod cues for safety as pt attempting to reach over RW for grab bar. Pt required total A for hygiene with CGA for standing balance. During stand pivot transfer toilet>w/c, pt's R knee buckling with pt reporting no awareness. Pt completed hand hygiene at sink initiating use of LUE. Pt completed stand pivot transfer w/c>bed with min A. Provided AAROM while supine in bed with wife returning demonstration. Encouraged wife to assist pt with AAROM 2-3 times a day outside of therapy. Pt left supine in bed with all needs in reach.   Therapy Documentation Precautions:  Precautions Precautions: Fall Restrictions Weight Bearing Restrictions: No General:   Vital Signs: Therapy Vitals Temp: 98.6 F (37 C) Temp Source: Oral Pulse Rate: 62 Resp: 17 BP: 132/77 mmHg Patient Position (if appropriate): Lying Oxygen Therapy SpO2: 97 % O2 Device: Not Delivered Pain:   ADL:   Exercises:   Other Treatments:    See FIM for current functional status  Therapy/Group: Individual Therapy  Duayne Cal 10/31/2014, 8:20 AM

## 2014-10-31 NOTE — Procedures (Signed)
Successful US guided paracentesis from LLQ.  Yielded 3 liters of clear yellow fluid.  No immediate complications.  Pt tolerated well.   Specimen was sent for labs.  Tsosie Billing D PA-C 10/31/2014 12:51 PM

## 2014-10-31 NOTE — Progress Notes (Signed)
Carney Living, PT informed nurse of paracentesis puncture site leaking and saturating gown. Upon assessment, patients gown was indeed saturated with serous fluid.  Applied pressure dressing to site.  Notified Algis Liming, PA who advised it was normal to leak some and to keep pressure dressing on site and watch.  Brita Romp, RN

## 2014-10-31 NOTE — Progress Notes (Addendum)
[facility]     PROGRESS NOTE    Subjective/Complaints: Still complains of scrotal/leg swelling. Neck sore.   Objective: Vital Signs: Blood pressure 132/77, pulse 62, temperature 98.6 F (37 C), temperature source Oral, resp. rate 17, height 5\' 10"  (1.778 m), weight 93.4 kg (205 lb 14.6 oz), SpO2 97 %. US Abdomen Limited  10/30/2014   CLINICAL DATA:  Evaluate for ascites. Cirrhosis. Abdominal distention  EXAM: LIMITED ABDOMEN ULTRASOUND FOR ASCITES  TECHNIQUE: Limited ultrasound survey for ascites was performed in all four abdominal quadrants.  COMPARISON:  None.  FINDINGS: Ascites is present in all 4 quadrants of the abdomen.  IMPRESSION: Ascites is present.   Electronically Signed   By: Rolla Flatten M.D.   On: 10/30/2014 13:10   No results for input(s): WBC, HGB, HCT, PLT in the last 72 hours.  Recent Labs  10/30/14 1610 10/31/14 0545  NA 127* 128*  K 4.4 4.7  CL 95* 95*  GLUCOSE 288* 224*  BUN 11 12  CREATININE 0.62 0.50  CALCIUM 8.0* 8.1*   CBG (last 3)   Recent Labs  10/30/14 1624 10/30/14 2105 10/31/14 0654  GLUCAP 258* 268* 192*    Wt Readings from Last 3 Encounters:  10/31/14 93.4 kg (205 lb 14.6 oz)  06/13/14 88.905 kg (196 lb)  02/07/14 89.812 kg (198 lb)    Physical Exam:  Nursing note and vitals reviewed. Constitutional: He is oriented to person, place, and time. He appears well-developed and well-nourished.  HENT:  Head: Normocephalic and atraumatic.  Eyes: Conjunctivae are normal. Pupils are equal, round, and reactive to light.  Neck: Muscular tenderness present. Decreased range of motion present.     Cardiovascular: Normal rate and regular rhythm.  Respiratory: Effort normal and breath sounds normal. No respiratory distress. He has no wheezes.  GI: Abdomen softer but sill with ongoing ascites/ distention. Bowel sounds are improved. There is no tenderness.  Musculoskeletal: He exhibits edema (1++ edema  pedally). Mild scrotal edema. Left shoulder sublux Neck pain with ROM bilateral hips and LUE.  Neurological: He is alert and oriented to person, place, and time. Coordination abnormal.  Left inattention due to neck and shoulder pain limiting ROM.Marland Kitchen Anxious appearing but speech clear and is able to follow basic commands without difficulty. RUE 4+/5. L- deltoid 1+/5,1+ biceps, 3+ to 4- triceps, intrinsics 4+/5. BLE 3+/5 with ataxia. No obvious sensory loss to LT/P.  DTR's 1+  Skin: Skin is warm and dry.  Healing abrasions bilateral shins  Psychiatric: His speech is normal.  . He's alert Uro: foley in place, dry blood around meatus  Assessment/Plan: 1. Functional deficits secondary to cervical epidural abscess which require 3+ hours per day of interdisciplinary therapy in a comprehensive inpatient rehab setting. Physiatrist is providing close team supervision and 24 hour management of active medical problems listed below. Physiatrist and rehab team continue to assess barriers to discharge/monitor patient progress toward functional and medical goals. FIM: FIM - Bathing Bathing Steps Patient Completed: Chest, Abdomen, Front perineal area, Buttocks, Right upper leg, Left upper leg, Left Arm Bathing: 3: Mod-Patient completes 5-7 12f 10 parts or 50-74%  FIM - Upper Body Dressing/Undressing Upper body dressing/undressing: 0: Wears gown/pajamas-no public clothing FIM - Lower Body Dressing/Undressing Lower body dressing/undressing steps patient completed: Thread/unthread left pants leg, Pull pants up/down Lower body dressing/undressing: 1: Total-Patient completed less than 25% of tasks  FIM - Toileting Toileting: 1: Total-Patient completed zero steps, helper did all 3  FIM - Toilet  Transfers Chief of Staff Devices: Product manager Transfers: 3-From toilet/BSC: Mod A (lift or lower assist), 3-To toilet/BSC: Mod A (lift or lower assist)  FIM - Buyer, retail Devices: Walker, Bed rails, Arm rests, HOB elevated Bed/Chair Transfer: 4: Supine > Sit: Min A (steadying Pt. > 75%/lift 1 leg), 4: Bed > Chair or W/C: Min A (steadying Pt. > 75%)  FIM - Locomotion: Wheelchair Locomotion: Wheelchair: 2: Travels 50 - 149 ft with moderate assistance (Pt: 50 - 74%) FIM - Locomotion: Ambulation Locomotion: Ambulation Assistive Devices: Administrator Ambulation/Gait Assistance: 4: Min assist Locomotion: Ambulation: 2: Travels 50 - 149 ft with minimal assistance (Pt.>75%)  Comprehension Comprehension Mode: Auditory Comprehension: 4-Understands basic 75 - 89% of the time/requires cueing 10 - 24% of the time  Expression Expression Mode: Verbal Expression: 4-Expresses basic 75 - 89% of the time/requires cueing 10 - 24% of the time. Needs helper to occlude trach/needs to repeat words.  Social Interaction Social Interaction: 4-Interacts appropriately 75 - 89% of the time - Needs redirection for appropriate language or to initiate interaction.  Problem Solving Problem Solving: 4-Solves basic 75 - 89% of the time/requires cueing 10 - 24% of the time  Memory Memory: 5-Recognizes or recalls 90% of the time/requires cueing < 10% of the time  Medical Problem List and Plan: 1. 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 DVT Prophylaxis/Anticoagulation: Mechanical:  Antiembolism stockings, knee (TED hose) Bilateral lower extremities Sequential compression devices, below knee Bilateral lower extremities 3. Pain Management: Duragesic patch 12.mcg. Decreased oxycodone to 5mg  prn given neurosedation. 4. Mood: Team to provide ego support. .  5. Neuropsych: This patient is capable of making decisions on his own behalf. 6.  Skin/Wound Care: Routine pressure relief measures. Maintain adequate nutritional support.  7. Fluids/Electrolytes/Nutrition: hyponatremia likely multifactorial, daily bmet for now  8. 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 Ascites on u/s, abdominal distention on exam   -spironolactone/lasix  -GI consult appreciated---paracentesis  -weight falling 9. Thrombocytopenia: Stable at 26 on most recent check.   10. DM type 2: Hgb A1c- 8.7. Monitor BS with ac/hs checks and will use SSI for elevated BS. BS are slowly trending upwards with improvement in po intake. resumed Tradjenta as was on Invokana and Tradjenta at home.  11. Reactive leucocytosis: Slowly resolving with treatment of infection.  12. Ileus: + results with SSE/program.    -d/c PO reglan 13. Urine retention: foley (with diuretic on board)  -ucx negative    LOS (Days) 6 A FACE TO FACE EVALUATION WAS PERFORMED  Trinitie Mcgirr T 10/31/2014 7:40 AM

## 2014-10-31 NOTE — Progress Notes (Signed)
Physical Therapy Session Note  Patient Details  Name: [name] MRN: [id number] Date of Birth: 1950-10-28  Today's Date: 10/31/2014 PT Individual Time: 1300-1400 PT Individual Time Calculation (min): 60 min   Short Term Goals: Week 1:  PT Short Term Goal 1 (Week 1): Patient will perform supine <> sit with supervision PT Short Term Goal 2 (Week 1): Patient will perform bed to wheelchair transfer with supervision. PT Short Term Goal 3 (Week 1): Patient will ambulate 50 feet with rolling walker and min assist PT Short Term Goal 4 (Week 1): Patient will propel wheelchair 150 feet in controlled environment PT Short Term Goal 5 (Week 1): Patient will ambulate up and down 8 steps with bilateral railings and min assist.  Skilled Therapeutic Interventions/Progress Updates:   Co-treat with recreational therapist for first half of session to address functional transfers and activity tolerance. Patient received semi reclined in bed, initially declining therapy but eventually agreeable to transfer to recliner. Patient transferred edge of bed with HOB elevated and use of rail with min A to scoot L hip forward and reported need for bathroom. Patient ambulated to bathroom using RW with min guard and +2 for IV pole management. Patient required total A for toileting tasks and performed toilet transfer from elevated seat with RW and min-mod A. Patient ambulated to sink and required seated rest break before washing hands in standing with cues to incorporate LUE. Patient's gown noted to be saturated at site of paracentesis puncture site, RN notified and applied pressure dressing. PA made aware. Patient performed sit <> stand from w/c using RW with S to change gowns. Patient left sitting in wheelchair with needs within reach and wife in room.   Therapy Documentation Precautions:  Precautions Precautions: Fall Restrictions Weight Bearing Restrictions: No Pain: Pain Assessment Pain Assessment: 0-10 Pain  Score: 4  Pain Location: Back Patients Stated Pain Goal: 5 Pain Intervention(s): Medication (See eMAR) Locomotion : Ambulation Ambulation/Gait Assistance: 4: Min assist;1: +2 Total assist   See FIM for current functional status  Therapy/Group: Individual Therapy  Laretta Alstrom 10/31/2014, 2:53 PM

## 2014-11-01 ENCOUNTER — Inpatient Hospital Stay (HOSPITAL_COMMUNITY): Payer: Managed Care, Other (non HMO) | Admitting: Speech Pathology

## 2014-11-01 ENCOUNTER — Ambulatory Visit (HOSPITAL_COMMUNITY): Payer: Managed Care, Other (non HMO) | Admitting: *Deleted

## 2014-11-01 ENCOUNTER — Inpatient Hospital Stay (HOSPITAL_COMMUNITY): Payer: Managed Care, Other (non HMO)

## 2014-11-01 LAB — BASIC METABOLIC PANEL
Anion gap: 6 (ref 5–15)
BUN: 9 mg/dL (ref 6–23)
CHLORIDE: 96 mmol/L (ref 96–112)
CO2: 26 mmol/L (ref 19–32)
CREATININE: 0.58 mg/dL (ref 0.50–1.35)
Calcium: 8.4 mg/dL (ref 8.4–10.5)
Glucose, Bld: 172 mg/dL — ABNORMAL HIGH (ref 70–99)
POTASSIUM: 4.3 mmol/L (ref 3.5–5.1)
SODIUM: 128 mmol/L — AB (ref 135–145)

## 2014-11-01 LAB — GLUCOSE, CAPILLARY
GLUCOSE-CAPILLARY: 193 mg/dL — AB (ref 70–99)
GLUCOSE-CAPILLARY: 183 mg/dL — AB (ref 70–99)
Glucose-Capillary: 168 mg/dL — ABNORMAL HIGH (ref 70–99)
Glucose-Capillary: 174 mg/dL — ABNORMAL HIGH (ref 70–99)

## 2014-11-01 LAB — PATHOLOGIST SMEAR REVIEW: Path Review: REACTIVE

## 2014-11-01 NOTE — Progress Notes (Signed)
Recreational Therapy Session Note  Patient Details  Name: [name] MRN: [id number] Date of Birth: 09-20-1950 Today's Date: 11/01/2014  Pain: 7/10 neck pain, repositioning & RN made aware  Skilled Therapeutic Interventions/Progress Updates: Co-treat with PT with focus on activity tolerance, & dynamic standing balance.  Pt stood to kick a ball alternating feet with R HHA, min-mod assist.  Pt with poor activity tolerance and is placed on HOLD for TR services.  Will continue to monitor through team for future participation.  Therapy/Group: Co-Treatment  Rainier Feuerborn 11/01/2014, 1:00 PM

## 2014-11-01 NOTE — Progress Notes (Signed)
Speech Language Pathology Weekly Progress and Session Note  Patient Details  Name: [name] MRN: [id number] Date of Birth: July 30, 1950  Beginning of progress report period: October 25, 2014 End of progress report period: November 01, 2014  Today's Date: 11/01/2014 SLP Individual Time: 0800-0900 SLP Individual Time Calculation (min): 60 min  Short Term Goals: Week 1: SLP Short Term Goal 1 (Week 1): Pt will utilize external/internal aids to facilitate recall of new information during functional tasks with Min cues SLP Short Term Goal 1 - Progress (Week 1): Met SLP Short Term Goal 2 (Week 1): Pt will demonstrate mildly complex problem solving to complete functional tasks with Min cues SLP Short Term Goal 2 - Progress (Week 1): Met    New Short Term Goals: Week 2: SLP Short Term Goal 1 (Week 2): Pt will utilize external/internal aids to facilitate recall of new information during functional tasks with supervision verbal and question cues.  SLP Short Term Goal 2 (Week 2): Pt will demonstrate mildly complex problem solving to complete functional tasks with supervision verbal and question cues  Weekly Progress Updates: Patient has made functional gains and has met 2 of 2 STG's this reporting period due to increased recall of new information and mildly complex problem solving. Currently, patient requires overall Min A multimodal cues to perform functional and familiar tasks safely in regards to working memory and complex problem solving. Patient/family education ongoing and patient would benefit from continued skilled SLP intervention to maximize his cognitive function and overall functional independence prior to discharge.    Intensity: Minumum of 1-2 x/day, 30 to 90 minutes Frequency: 3 to 5 out of 7 days Duration/Length of Stay: 11/11/14  Treatment/Interventions: Cognitive remediation/compensation;Cueing hierarchy;Environmental controls;Functional tasks;Internal/external aids;Medication  managment;Patient/family education   Daily Session  Skilled Therapeutic Interventions: Skilled treatment session focused on cognitive goals. SLP facilitated session by providing Min A question cues to self-monitor and correct errors during a calender organization task and for functional problem solving during a mildly complex, new learning task. However, patient recalled the rules to new learning task with supervision question cues. Patient independently requested to use brush his teeth and use the bathroom, patient required supervision question cues for problem solving and safety with task. Patient left on commode with NT. Continue with current plan of care.    FIM:  Comprehension Comprehension Mode: Auditory Comprehension: 5-Follows basic conversation/direction: With extra time/assistive device Expression Expression Mode: Verbal Expression: 5-Expresses basic needs/ideas: With extra time/assistive device Social Interaction Social Interaction: 5-Interacts appropriately 90% of the time - Needs monitoring or encouragement for participation or interaction. Problem Solving Problem Solving: 5-Solves basic 90% of the time/requires cueing < 10% of the time Memory Memory: 5-Recognizes or recalls 90% of the time/requires cueing < 10% of the time Pain Pain Assessment Pain Score: Asleep  Therapy/Group: Individual Therapy  Charnetta Wulff 11/01/2014, 3:50 PM

## 2014-11-01 NOTE — Progress Notes (Signed)
Occupational Therapy Note  Patient Details  Name: [name] MRN: [id number] Date of Birth: [birth year]  Today's Date: 11/01/2014 OT Individual Time: 1330-1400 OT Individual Time Calculation (min): 30 min   Pt denied pain Individual Therapy  Pt practiced tub bench transfer (squat pivot) requiring min A to lift LLE in/out tub.  Pt requested to use toilet upon return to room and transferred to toilet in room (stand pivot using grab bars).  Pt remained on toilet and RN/nursing staff notified.  Pt requires more than reasonable amount of time to complete tasks.   [name] [facility] 11/01/2014, 2:55 PM

## 2014-11-01 NOTE — Progress Notes (Signed)
Daily Rounding Note  11/01/2014, 8:59 AM  LOS: 7 days   SUBJECTIVE:       1 stool yesterday.  Remains sleepy,feels tired.  Belly feels better and scrotal edema improved.  Foley still in place. Used 15 mg of oxycodone.   OBJECTIVE:         Vital signs in last 24 hours:    Temp:  [98 F (36.7 C)-99.2 F (37.3 C)] 99.2 F (37.3 C) (04/27 0528) Pulse Rate:  [60-78] 78 (04/27 0528) Resp:  [16-18] 16 (04/27 0528) BP: (112-135)/(55-63) 124/55 mmHg (04/27 0528) SpO2:  [94 %-96 %] 94 % (04/27 0528) Weight:  [208 lb 8.9 oz (94.6 kg)] 208 lb 8.9 oz (94.6 kg) (04/27 0528) Last BM Date: 10/30/14 Filed Weights   10/30/14 0607 10/31/14 0537 11/01/14 0528  Weight: 207 lb 0.2 oz (93.9 kg) 205 lb 14.6 oz (93.4 kg) 208 lb 8.9 oz (94.6 kg)   General: lethargic but arouses easily   Heart: RRR Chest: clear bil.  No labored breathing, sob or cough Abdomen: softer, less protuberant, NT, active BS GU: scrotal edema improved, not resolved  Extremities: + LE edema Neuro/Psych:  Oriented x 3.  No asterixis, somnolent/lethargic.   Intake/Output from previous day: 04/26 0701 - 04/27 0700 In: 360 [P.O.:360] Out: 2500 [Urine:2500]  Intake/Output this shift:    Lab Results: No results for input(s): WBC, HGB, HCT, PLT in the last 72 hours. BMET  Recent Labs  10/30/14 1610 10/31/14 0545 11/01/14 0520  NA 127* 128* 128*  K 4.4 4.7 4.3  CL 95* 95* 96  CO2 27 28 26   GLUCOSE 288* 224* 172*  BUN 11 12 9   CREATININE 0.62 0.50 0.58  CALCIUM 8.0* 8.1* 8.4   LFT  Recent Labs  10/30/14 1610  PROT 6.4  ALBUMIN 1.7*  AST 29  ALT 16  ALKPHOS 88  BILITOT 1.4*   Ascitic Fluid studies.   Ref. Range 10/31/2014 11:59  Monocyte-Macrophage-Serous Fluid Latest Ref Range: 50-90 % 57  Other Cells, Fluid Latest Units: % MESOTHELIAL CELLS  Albumin, Fluid Latest Units: g/dL <1.0  Fluid Type-FALB Unknown FLUID  Fluid Type-FCT Unknown FLUID    Color, Fluid Latest Ref Range: YELLOW  YELLOW (A)  WBC, Fluid Latest Ref Range: 0-1000 cu mm 159  Lymphs, Fluid Latest Units: % 25  Appearance, Fluid Latest Ref Range: CLEAR  HAZY (A)  Neutrophil Count, Fluid Latest Ref Range: 0-25 % 18   Studies/Results: US Abdomen Limited  10/30/2014   CLINICAL DATA:  Evaluate for ascites. Cirrhosis. Abdominal distention  EXAM: LIMITED ABDOMEN ULTRASOUND FOR ASCITES  TECHNIQUE: Limited ultrasound survey for ascites was performed in all four abdominal quadrants.  COMPARISON:  None.  FINDINGS: Ascites is present in all 4 quadrants of the abdomen.  IMPRESSION: Ascites is present.   Electronically Signed   By: Rolla Flatten M.D.   On: 10/30/2014 13:10   US Paracentesis  10/31/2014   INDICATION: Cirrhosis, recurrent ascites and request for paracentesis.  EXAM: ULTRASOUND-GUIDED PARACENTESIS  COMPARISON:  [place] Abd limited 10/30/14.  MEDICATIONS: None.  COMPLICATIONS: None immediate  TECHNIQUE: Informed written consent was obtained from the patient after a discussion of the risks, benefits and alternatives to treatment. A timeout was performed prior to the initiation of the procedure.  Initial ultrasound scanning demonstrates a large amount of ascites within the left lower abdominal quadrant. The left lower abdomen was prepped and draped in the usual sterile fashion. 1% lidocaine  was used for local anesthesia.  An ultrasound image was saved for documentation purposed. A 6 Fr Safe-T-Centesis catheter was introduced. The paracentesis was performed. The catheter was removed and a dressing was applied. The patient tolerated the procedure well without immediate post procedural complication.  FINDINGS: A total of maximum requested 3 liters of serous fluid was removed. Samples were sent to the laboratory as requested by the clinical team.  IMPRESSION: Successful ultrasound-guided paracentesis yielding 3 liters of peritoneal fluid.  Read By:  Tsosie Billing PA-C   Electronically Signed    By: Corrie Mckusick D.O.   On: 10/31/2014 12:55   Scheduled Meds: . cefTRIAXone (ROCEPHIN)  IV  2 g Intravenous Q12H  . feeding supplement (PRO-STAT SUGAR FREE 64)  30 mL Oral BID  . furosemide  20 mg Oral Daily  . glimepiride  4 mg Oral Q breakfast  . insulin aspart  0-15 Units Subcutaneous TID WC  . insulin aspart  0-5 Units Subcutaneous QHS  . lactulose  20 g Oral BID  . lidocaine  2 patch Transdermal Q24H  . linagliptin  5 mg Oral Daily  . pantoprazole  40 mg Oral q morning - 10a  . propranolol  20 mg Oral q morning - 10a  . simethicone  160 mg Oral QID  . spironolactone  50 mg Oral Daily  . Vitamin D (Ergocalciferol)  50,000 Units Oral Q Mon   Continuous Infusions:  PRN Meds:.sodium chloride, acetaminophen, alum & mag hydroxide-simeth, bisacodyl, diphenhydrAMINE, guaiFENesin-dextromethorphan, methocarbamol, morphine, naLOXone (NARCAN)  injection, ondansetron **OR** ondansetron (ZOFRAN) IV, oxyCODONE, sodium chloride, sodium phosphate, traZODone    ASSESMENT:   * Long-standing cirrhosis due to [place]. Up-to-date with MRI screening for [place]. AFP level 10/13/2014 8.8.  Received A/hep B vaccination series in 2015.  4/16 PT/INR 17/1.3 so doing well from synthetic parameter.   * Ascites. Paracentesis x 3 liters 4/26: no evidence SBP.  Due to undefined fluid albumin level unable to calculate SAAG. Low dose aldactone/lasix (50/20) initiated 4/25. Overall weight is rising.   * Hyponatremia. Need to monitor closely since now on diuretics. Stable x 48 hours.  Renal function remains in normal limits. .   * Long-standing history of esophageal varices, portal hypertension and portal colopathy. Up to date with screening EGD as of 06/2014 (no banding required). On chronic propranolol.  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 On prolonged course of Rocephin. Taking oxycodone for neck and left shoulder pain.   * Constipation.   * Type 2 diabetes. On multiple oral agents  prior to admission    PLAN   *  Tomorrow if stable Na level and tolerable BP, will increase the Aldactone to 100 mg. May need to reduce Propanolol dose to try to maintain BP in setting of increasing diuretics. However at latest EGD, varices were small and did not require additional banding.     [name]  11/01/2014, 8:59 AM Pager: [phone]

## 2014-11-01 NOTE — Progress Notes (Signed)
Physical Therapy Session Note  Patient Details  Name: [name] MRN: [id number] Date of Birth: 08-31-1950  Today's Date: 11/01/2014 PT Individual Time: 1000-1100 PT Individual Time Calculation (min): 60 min   Short Term Goals: Week 1:  PT Short Term Goal 1 (Week 1): Patient will perform supine <> sit with supervision PT Short Term Goal 2 (Week 1): Patient will perform bed to wheelchair transfer with supervision. PT Short Term Goal 3 (Week 1): Patient will ambulate 50 feet with rolling walker and min assist PT Short Term Goal 4 (Week 1): Patient will propel wheelchair 150 feet in controlled environment PT Short Term Goal 5 (Week 1): Patient will ambulate up and down 8 steps with bilateral railings and min assist.  Skilled Therapeutic Interventions/Progress Updates:   Session focused on standing balance, ambulation, functional transfers, strengthening, and activity tolerance. Patient received semi reclined in bed, agreeable to OOB activity with mod coaxing and transferred supine > sit with HOB elevated and rail with S. Patient performed stand pivot transfers using RW with min guard and verbal cues for safe hand placement. Performed dynamic standing balance with R HHA while patient kicked ball back to RT using R and L feet with min-mod A for balance and lateral weight shifting. Moist heat pack applied to L neck for pain management and assist in increasing cervical rotation to L and overall cervical AROM.Trialled gait using LBQC due to decreased functional AROM of LUE x 15 ft with min A and max multimodal cues for safe sequencing, +2 for IV pole follow. Gait using RW x 75 ft with supervision > min guard and +2 for IV pole follow. Patient reported improved comfort and safety with use of RW at this time. Stair training up/down 5 stairs using 2 rails with min A overall. Patient self-electing step-to pattern despite max cues for safe sequencing leading with RLE ascending and LLE descending. Patient  returned to room, transferred to recliner, and left sitting in recliner with BLE elevated and needs within reach, wife in room.    Therapy Documentation Precautions:  Precautions Precautions: Fall Restrictions Weight Bearing Restrictions: No Pain: Pain Assessment Pain Assessment: 0-10 Pain Score: 7  Pain Type: Acute pain Pain Location: Neck Pain Orientation: Left Pain Descriptors / Indicators: Aching Pain Frequency: Constant Pain Onset: On-going Patients Stated Pain Goal: 3 Pain Intervention(s): Repositioned;Heat applied;RN made aware Locomotion : Ambulation Ambulation/Gait Assistance: 4: Min assist;1: +2 Total assist (for IV pole )   See FIM for current functional status  Therapy/Group: Individual Therapy  Laretta Alstrom 11/01/2014, 11:41 AM

## 2014-11-01 NOTE — Patient Care Conference (Signed)
Inpatient RehabilitationTeam Conference and Plan of Care Update Date: 10/31/2014   Time: 3:00 PM    Patient Name: [name]      Medical Record Number: [id number]  Date of Birth: 06/11/1951 Sex: Male         Room/Bed: 4M07C/4M07C-01 Payor Info: Payor: [employer] / Plan: C[employer] MANAGED / Product Type: *No Product type* /    Admitting Diagnosis: Cervical epidural abscess   Admit Date/Time:  10/25/2014  5:35 PM Admission Comments: No comment available   Primary Diagnosis:  Abscess in epidural space of cervical spine Principal Problem: Abscess in epidural space of cervical spine  Patient Active Problem List   Diagnosis Date Noted  . Ascites 10/30/2014  . Ileus, postoperative 10/26/2014  . Left cervical radiculopathy 10/25/2014  . Cervical myelopathy 10/25/2014  . Thrombocytopenia 10/22/2014  . Group B streptococcal infection   . Abscess in epidural space of cervical spine   . Encephalopathy acute   . Streptococcal bacteremia   . NASH (nonalcoholic steatohepatitis)   . Other cirrhosis of liver   . Diabetes mellitus type 2, uncontrolled   . 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 UGIB (upper gastrointestinal bleed) 10/27/2012  . Anemia due to acute blood loss 10/27/2012  . Volume depletion 10/27/2012  . 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 GERD 03/28/2010  . CONSTIPATION 03/28/2010  . ABDOMINAL PAIN, GENERALIZED 03/28/2010  . ASCITES 06/13/2008  . PERSONAL HX COLONIC POLYPS 06/13/2008  . DM 09/27/2007  . OBESITY 09/27/2007  . PANCYTOPENIA 09/27/2007  . HYPERTENSION 09/27/2007  . INTERNAL HEMORRHOIDS 09/27/2007  . CIRRHOSIS 09/27/2007  . NEPHROLITHIASIS 09/27/2007  . IRON DEFICIENCY ANEMIA, HX OF 09/27/2007  . Personal history of other diseases of digestive system 09/27/2007  . COLONIC POLYPS, ADENOMATOUS 11/19/2006  . ESOPHAGEAL VARICES 11/19/2006  . DUODENITIS 11/19/2006  . ESOPHAGITIS, REFLUX 09/23/2004  . ESOPHAGEAL STRICTURE 09/23/2004  . Portal  hypertension 09/23/2004  . DIVERTICULOSIS, COLON 08/14/2004    Expected Discharge Date: Expected Discharge Date: 11/11/14  Team Members Present: Physician leading conference: Dr. Alger Simons Social Worker Present: Lennart Pall, LCSW Nurse Present: Dorien Chihuahua, RN PT Present: Carney Living, PT OT Present: Roanna Epley, COTA;Jennifer Jolene Schimke, OT SLP Present: Weston Anna, SLP Other (Discipline and Name): Danne Baxter, RN [facility]) PPS Coordinator present : Daiva Nakayama, RN, [facility]     Current Status/Progress Goal Weekly Team Focus  Medical   cervical abscess with myelopathy/radics. ileus resolved, ascites   improve engagement, activity tolerance  improve activity tolerance, ascites mgt   Bowel/Bladder   Continent of bowel; has some urgency likely r/t laxatives; LBM 4/25; foley in place for urinary retention; abdomen distended, firm  Mod I  Assess need for catheter and start voiding trial as patient is more active   Swallow/Nutrition/ Hydration   No c/o nausea with soft diet; encouraging PO intake         ADL's   mod A UB self-care and bathing, max-total A LB dressing; mod-max A toileting, min-mod A transfers  supervision-Mod I (will likely downgrade to supervision overall)  L NMR, functional transfers, balance, activity tolerance, attention to L, family education, strengthening   Mobility   min A overall, decreased activity tolerance  supervision-mod I  NMR, functional transfers, standing balance, ROM, L attention, strengthening, activity tolerance, safety, pt/family education   Communication             Safety/Cognition/ Behavioral Observations  Min-Mod A  Supervision   mildly complex problem solving, working Marine scientist  Pain   C/o pain in neck and left shoulder; oxycodone 5mg  po q4h prn; MSIR 15mg  po q4h prn; tylenol 650 mg po q4h prn; lidoderm patches to site- seem effective  < 4  Assess and treat for pain q shift and prn; monitor for adverse effects of meds    Skin   Red, swollen scrotum with rash to inner thighs- microguard powder in use; sacrum red but blanchable  Mod I  Assess skin q shift and prn    Rehab Goals Patient on target to meet rehab goals: Yes *See Care Plan and progress notes for long and short-term goals.  Barriers to Discharge: ascites, pain control, left upper ext weakness    Possible Resolutions to Barriers:  rx above, support pain, left shoulder    Discharge Planning/Teaching Needs:  home with wife to provide 24/7 assistance      Team Discussion:  acsites and with paracentesis today with 3L fluid removed.  Neck and shoulder pain continue.  Currently min/ mod tranfers and with very flat affect.  May need some type of shoulder support.  Goals set for supervision overall.  Revisions to Treatment Plan:  None   Continued Need for Acute Rehabilitation Level of Care: The patient requires daily medical management by a physician with specialized training in physical medicine and rehabilitation for the following conditions: Daily direction of a multidisciplinary physical rehabilitation program to ensure safe treatment while eliciting the highest outcome that is of practical value to the patient.: Yes Daily analysis of laboratory values and/or radiology reports with any subsequent need for medication adjustment of medical intervention for : Post surgical problems;Neurological problems  Kabao Leite 11/01/2014, 2:21 PM

## 2014-11-01 NOTE — Progress Notes (Signed)
[facility] PHYSICAL MEDICINE & REHABILITATION     PROGRESS NOTE    Subjective/Complaints: Feeling a little better. Scrotum still swollen. Slept better. A little coughing after water going "down the wrong way" this am   Objective: Vital Signs: Blood pressure 124/55, pulse 78, temperature 99.2 F (37.3 C), temperature source Oral, resp. rate 16, height 5\' 10"  (1.778 m), weight 94.6 kg (208 lb 8.9 oz), SpO2 94 %. US Abdomen Limited  10/30/2014   CLINICAL DATA:  Evaluate for ascites. Cirrhosis. Abdominal distention  EXAM: LIMITED ABDOMEN ULTRASOUND FOR ASCITES  TECHNIQUE: Limited ultrasound survey for ascites was performed in all four abdominal quadrants.  COMPARISON:  None.  FINDINGS: Ascites is present in all 4 quadrants of the abdomen.  IMPRESSION: Ascites is present.   Electronically Signed   By: Rolla Flatten M.D.   On: 10/30/2014 13:10   US Paracentesis  10/31/2014   INDICATION: Cirrhosis, recurrent ascites and request for paracentesis.  EXAM: ULTRASOUND-GUIDED PARACENTESIS  COMPARISON:  [place] Abd limited 10/30/14.  MEDICATIONS: None.  COMPLICATIONS: None immediate  TECHNIQUE: Informed written consent was obtained from the patient after a discussion of the risks, benefits and alternatives to treatment. A timeout was performed prior to the initiation of the procedure.  Initial ultrasound scanning demonstrates a large amount of ascites within the left lower abdominal quadrant. The left lower abdomen was prepped and draped in the usual sterile fashion. 1% lidocaine was used for local anesthesia.  An ultrasound image was saved for documentation purposed. A 6 Fr Safe-T-Centesis catheter was introduced. The paracentesis was performed. The catheter was removed and a dressing was applied. The patient tolerated the procedure well without immediate post procedural complication.  FINDINGS: A total of maximum requested 3 liters of serous fluid was removed. Samples were sent to the laboratory as requested by the  clinical team.  IMPRESSION: Successful ultrasound-guided paracentesis yielding 3 liters of peritoneal fluid.  Read By:  Tsosie Billing PA-C   Electronically Signed   By: Corrie Mckusick D.O.   On: 10/31/2014 12:55   No results for input(s): WBC, HGB, HCT, PLT in the last 72 hours.  Recent Labs  10/31/14 0545 11/01/14 0520  NA 128* 128*  K 4.7 4.3  CL 95* 96  GLUCOSE 224* 172*  BUN 12 9  CREATININE 0.50 0.58  CALCIUM 8.1* 8.4   CBG (last 3)   Recent Labs  10/31/14 1612 10/31/14 2042 11/01/14 0647  GLUCAP 183* 200* 168*    Wt Readings from Last 3 Encounters:  11/01/14 94.6 kg (208 lb 8.9 oz)  06/13/14 88.905 kg (196 lb)  02/07/14 89.812 kg (198 lb)    Physical Exam:  Nursing note and vitals reviewed. Constitutional: He is oriented to person, place, and time. He appears well-developed and well-nourished.  HENT:  Head: Normocephalic and atraumatic.  Eyes: Conjunctivae are normal. Pupils are equal, round, and reactive to light.  Neck: Muscular tenderness present. Decreased range of motion present.     Cardiovascular: Normal rate and regular rhythm.  Respiratory: Effort normal and breath sounds normal. No respiratory distress. He has no wheezes.  GI: Abdomen softer but sill with ongoing ascites/ distention. Bowel sounds are improved. There is no tenderness.  Musculoskeletal: He exhibits edema (1++ edema pedally). Mild scrotal edema. Left shoulder sublux Neck pain with ROM bilateral hips and LUE.  Neurological: He is alert and oriented to person, place, and time. Coordination abnormal.  Left inattention due to neck and shoulder pain limiting ROM.Marland Kitchen Anxious appearing but speech  clear and is able to follow basic commands without difficulty. RUE 4+/5. L- deltoid 1+/5,1+ biceps, 3+ to 4- triceps, intrinsics 4+/5. BLE 3+/5 with ataxia. No obvious sensory loss to LT/P.  DTR's 1+  Skin: Skin is warm and dry.  Healing abrasions bilateral shins  Psychiatric: His speech is normal.   He's alert Uro: foley in place. No breakdown  Assessment/Plan: 1. Functional deficits secondary to cervical epidural abscess which require 3+ hours per day of interdisciplinary therapy in a comprehensive inpatient rehab setting. Physiatrist is providing close team supervision and 24 hour management of active medical problems listed below. Physiatrist and rehab team continue to assess barriers to discharge/monitor patient progress toward functional and medical goals.  FIM: FIM - Bathing Bathing Steps Patient Completed: Chest, Abdomen, Front perineal area, Buttocks, Right upper leg, Left upper leg, Left Arm Bathing: 3: Mod-Patient completes 5-7 13f 10 parts or 50-74%  FIM - Upper Body Dressing/Undressing Upper body dressing/undressing steps patient completed: Thread/unthread right sleeve of pullover shirt/dresss, Thread/unthread left sleeve of pullover shirt/dress, Put head through opening of pull over shirt/dress Upper body dressing/undressing: 4: Min-Patient completed 75 plus % of tasks FIM - Lower Body Dressing/Undressing Lower body dressing/undressing steps patient completed: Thread/unthread right pants leg, Thread/unthread left pants leg, Pull pants up/down Lower body dressing/undressing: 3: Mod-Patient completed 50-74% of tasks  FIM - Toileting Toileting: 1: Total-Patient completed zero steps, helper did all 3  FIM - Radio producer Devices: Walker, Elevated toilet seat Toilet Transfers: 4-To toilet/BSC: Min A (steadying Pt. > 75%), 3-From toilet/BSC: Mod A (lift or lower assist)  FIM - Control and instrumentation engineer Devices: Walker, Arm rests Bed/Chair Transfer: 3: Chair or W/C > Bed: Mod A (lift or lower assist), 4: Chair or W/C > Bed: Min A (steadying Pt. > 75%)  FIM - Locomotion: Wheelchair Locomotion: Wheelchair: 0: Activity did not occur FIM - Locomotion: Ambulation Locomotion: Ambulation Assistive Devices: Astronomer Ambulation/Gait Assistance: 4: Min assist, 1: +2 Total assist Locomotion: Ambulation: 1: Two helpers (+2 for IV pole)  Comprehension Comprehension Mode: Auditory Comprehension: 5-Follows basic conversation/direction: With extra time/assistive device  Expression Expression Mode: Verbal Expression: 5-Expresses basic needs/ideas: With extra time/assistive device  Social Interaction Social Interaction: 5-Interacts appropriately 90% of the time - Needs monitoring or encouragement for participation or interaction.  Problem Solving Problem Solving: 4-Solves basic 75 - 89% of the time/requires cueing 10 - 24% of the time  Memory Memory: 5-Recognizes or recalls 90% of the time/requires cueing < 10% of the time  Medical Problem List and Plan: 1. 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 DVT Prophylaxis/Anticoagulation: Mechanical:  Antiembolism stockings, knee (TED hose) Bilateral lower extremities Sequential compression devices, below knee Bilateral lower extremities 3. Pain Management: Duragesic patch 12.mcg. Decreased oxycodone to 5mg  prn given neurosedation.  -kinesiotape, ice/heat, posture 4. Mood:   provide ego support.  5. Neuropsych: This patient is capable of making decisions on his own behalf. 6. Skin/Wound Care: Routine pressure relief measures. Maintain adequate nutritional support.  7. Fluids/Electrolytes/Nutrition: hyponatremia likely multifactorial, daily bmet for now  8. 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 Ascites on u/s, abdominal distention on exam   -spironolactone/lasix  -GI consult appreciated---paracentesis 3L yesterday  -weight  at 94.6kg 9. Thrombocytopenia: Stable at 26 on most recent check.    10. DM type 2: Hgb A1c- 8.7. Monitor BS with ac/hs checks and will use SSI for elevated BS. BS are slowly trending upwards with improvement in po intake. resumed Tradjenta as was on Invokana and Tradjenta at home.  11. Reactive leucocytosis: Slowly resolving with treatment of infection.  12. Ileus: + results with SSE/program.    -d/c'ed PO reglan 13. Urine retention: foley (with diuretic on board)  -ucx negative    LOS (Days) 7 A FACE TO FACE EVALUATION WAS PERFORMED  [name] T 11/01/2014 8:16 AM

## 2014-11-01 NOTE — Plan of Care (Signed)
Problem: RH Balance Goal: LTG Patient will maintain dynamic standing with ADLs (OT) LTG: Patient will maintain dynamic standing balance with assist during activities of daily living (OT)  Downgraded to supervision secondary to slow progress  Problem: RH Grooming Goal: LTG Patient will perform grooming w/assist,cues/equip (OT) LTG: Patient will perform grooming with assist, with/without cues using equipment (OT)  Downgraded to supervision secondary to slow progress  Problem: RH Dressing Goal: LTG Patient will perform upper body dressing (OT) LTG Patient will perform upper body dressing with assist, with/without cues (OT).  Downgraded to supervision secondary to slow progress Goal: LTG Patient will perform lower body dressing w/assist (OT) LTG: Patient will perform lower body dressing with assist, with/without cues in positioning using equipment (OT)  Downgraded to supervision secondary to slow progress  Problem: RH Toilet Transfers Goal: LTG Patient will perform toilet transfers w/assist (OT) LTG: Patient will perform toilet transfers with assist, with/without cues using equipment (OT)  Downgraded to supervision secondary to slow progress  Problem: RH Tub/Shower Transfers Goal: LTG Patient will perform tub/shower transfers w/assist (OT) LTG: Patient will perform tub/shower transfers with assist, with/without cues using equipment (OT)  Downgraded to supervision secondary to slow progress

## 2014-11-01 NOTE — Progress Notes (Signed)
Occupational Therapy Weekly Progress Note  Patient Details  Name: [name] MRN: [id number] Date of Birth: September 10, 1950  Beginning of progress report period: October 26, 2014 End of progress report period: November 01, 2014  Today's Date: 11/01/2014 OT Individual Time: 0700-0800 OT Individual Time Calculation (min): 60 min    Patient has met 1 of 3 short term goals.  Patient has made slow progress during this therapy session greatly due to ascites causing excessive swelling. Patient currently requires min A overall for functional transfers and short distance functional mobility. Patient requires max A LB dressing and min A UB dressing with increased time and use of AE. Patient requires mod-max cues for incorporating LUE into functional activities at gross assist level. Patient's wife has been educated to assist patient with AAROM exercises outside of therapy. Patient's greatest barriers at this time are generalized weakness, edema, and decreased activity tolerance.   Patient continues to demonstrate the following deficits: L hemiplegia, decreased postural control, decreased balance strategies, decreased activity tolerance, decreased strength, decreased coordination, decreased alertness, decreased problem solving, decreased safety awareness, decreased functional use of LUE and therefore will continue to benefit from skilled OT intervention to enhance overall performance with BADLs, ability to compensate for deficits, strengthening, balance, and activity tolerance.  Patient not progressing toward long term goals.  See goal revision..  Plan of care revisions: Goals downgraded to supervision overall secondary to slow progress in therapy.  OT Short Term Goals Week 1:  OT Short Term Goal 1 (Week 1): Pt will complete LB dressing at supervision level OT Short Term Goal 1 - Progress (Week 1): Not met OT Short Term Goal 2 (Week 1): Pt will complete toilet transfer at supervision level OT Short Term Goal 2  - Progress (Week 1): Not met OT Short Term Goal 3 (Week 1): Pt will complete functional activity in standing for 3 min to increase activity tolerance OT Short Term Goal 3 - Progress (Week 1): Met Week 2:  OT Short Term Goal 1 (Week 2): Pt will complete LB dressing with min assist OT Short Term Goal 2 (Week 2): Pt will complete toilet task with steadying assist for balance only OT Short Term Goal 3 (Week 2): Pt will complete toilet transfer at supervision level OT Short Term Goal 4 (Week 2): Pt will utilize LUE at gross assist level during self-care task with min cues  Skilled Therapeutic Interventions/Progress Updates:    Pt seen for ADL retraining with focus on postural control, standing balance, functional transfers, use of AE, and activity tolerance. Pt received sitting in recliner chair. Pt completed sit>stand from recliner with mod A then stand pivot transfer to w/c with min A using RW. Completed bathing sit<>stand at sink with pt demonstrating improved anterior weight shift. Pt required mod cues to utilized LUE at gross assist level during bathing. Pt required min A sit<>stand from w/c at sink with verbal cues and manual facilitation for upright posture. Pt utilized reacher for LB dressing, progressing to mod A to complete LB dressing. Pt required frequent rest breaks throughout therapy session due to fatigue. Pt left sitting in w/c with RN present and all needs in reach.  Therapy Documentation Precautions:  Precautions Precautions: Fall Restrictions Weight Bearing Restrictions: No General:   Vital Signs: Therapy Vitals Temp: 99.2 F (37.3 C) Temp Source: Oral Pulse Rate: 78 Resp: 16 BP: (!) 124/55 mmHg Patient Position (if appropriate): Lying Oxygen Therapy SpO2: 94 % O2 Device: Not Delivered Pain: Pain Assessment Pain Assessment: 0-10  Pain Score: 6  Pain Type: Acute pain Pain Location: Neck Pain Orientation: Mid Pain Descriptors / Indicators: Aching Pain Frequency:  Constant Pain Onset: On-going Patients Stated Pain Goal: 3 Pain Intervention(s): Medication (See eMAR)  See FIM for current functional status  Therapy/Group: Individual Therapy  Duayne Cal 11/01/2014, 8:08 AM

## 2014-11-01 NOTE — Progress Notes (Signed)
Social Work Patient ID: [name], male   DOB: 18-Jul-1950, 64 y.o.   MRN: [id number]  Met yesterday afternoon with pt, wife and son in the room to review team conference.  Pt reports having the paracentesis earlier in the day and hopeful that this will make a difference in his therapies.  Aware that targeted dc date of 5/7 set with supervision goals.  Son asks if a transition to SNF is an option.  He is concerned that pt may require more physical assistance than his mother can provide and notes, "If he falls at home then anybody who can help is an hour away.  She (wife) can't pick him up off the floor."  Explained to all that if pt is able to reach these supervision goals, than team would expect that this is a level that wife can manage.  Acknowledged his concerns (that pt and wife share) and stress that, if pt not reaching goals or a level of care that wife feels comfortable with, then we can certainly consider SNF as a transition to home.  Will monitor pt's therapy over the next few days and stay in touch with pt and family about their comfort level.  Have also discussed this with insurance case manager, Rayetta Humphrey.  Continue to follow.  Lien Lyman, LCSW

## 2014-11-02 ENCOUNTER — Inpatient Hospital Stay (HOSPITAL_COMMUNITY): Payer: Managed Care, Other (non HMO)

## 2014-11-02 ENCOUNTER — Inpatient Hospital Stay (HOSPITAL_COMMUNITY): Payer: Managed Care, Other (non HMO) | Admitting: Speech Pathology

## 2014-11-02 ENCOUNTER — Inpatient Hospital Stay (HOSPITAL_COMMUNITY): Payer: Managed Care, Other (non HMO) | Admitting: Physical Therapy

## 2014-11-02 LAB — BASIC METABOLIC PANEL
ANION GAP: 5 (ref 5–15)
BUN: 10 mg/dL (ref 6–23)
CHLORIDE: 98 mmol/L (ref 96–112)
CO2: 27 mmol/L (ref 19–32)
Calcium: 8.5 mg/dL (ref 8.4–10.5)
Creatinine, Ser: 0.53 mg/dL (ref 0.50–1.35)
GFR calc non Af Amer: >90 mL/min (ref >90)
Glucose, Bld: 184 mg/dL — ABNORMAL HIGH (ref 70–99)
Potassium: 4.4 mmol/L (ref 3.5–5.1)
SODIUM: 130 mmol/L — AB (ref 135–145)

## 2014-11-02 LAB — GLUCOSE, CAPILLARY
GLUCOSE-CAPILLARY: 167 mg/dL — AB (ref 70–99)
Glucose-Capillary: 151 mg/dL — ABNORMAL HIGH (ref 70–99)
Glucose-Capillary: 218 mg/dL — ABNORMAL HIGH (ref 70–99)
Glucose-Capillary: 170 mg/dL — ABNORMAL HIGH (ref 70–99)

## 2014-11-02 MED ORDER — BENZONATATE 100 MG PO CAPS
100.0000 mg | ORAL_CAPSULE | Freq: Three times a day (TID) | ORAL | Status: DC | PRN
  Administered 2014-11-10: 100 mg via ORAL
  Filled 2014-11-02 (×2): qty 1

## 2014-11-02 MED ORDER — LIDOCAINE 5 % EX PTCH: 2.0000 | MEDICATED_PATCH | CUTANEOUS | Status: DC

## 2014-11-02 MED ORDER — SPIRONOLACTONE 100 MG PO TABS
100.0000 mg | ORAL_TABLET | Freq: Every day | ORAL | Status: DC
  Administered 2014-11-03 – 2014-11-06 (×4): 100 mg via ORAL
  Filled 2014-11-02 (×5): qty 1

## 2014-11-02 NOTE — Progress Notes (Signed)
Speech Language Pathology Daily Session Note  Patient Details  Name: [name] MRN: [id number] Date of Birth: 1950/11/08  Today's Date: 11/02/2014 SLP Individual Time: 0900-1000 SLP Individual Time Calculation (min): 60 min  Short Term Goals: Week 2: SLP Short Term Goal 1 (Week 2): Pt will utilize external/internal aids to facilitate recall of new information during functional tasks with supervision verbal and question cues.  SLP Short Term Goal 2 (Week 2): Pt will demonstrate mildly complex problem solving to complete functional tasks with supervision verbal and question cues  Skilled Therapeutic Interventions: Skilled treatment session focused on cognitive goals. SLP facilitated session by providing supervision question cues for recall of rules to a previously learned task. Patient then required Min A multimodal cues for functional problem solving and organization with the mildly complex task. Patient consumed breakfast meal of regular textures with thin liquids without overt s/s of aspiration but required Min A verbal and question cues for problem solving with tray set-up. Patient recalled events from previous therapy sessions and utilized calendar to anticipate future sessions with supervision question cues. Patient left in wheelchair with all needs within reach. Continue with current plan of care.    FIM:  Comprehension Comprehension Mode: Auditory Comprehension: 5-Follows basic conversation/direction: With extra time/assistive device Expression Expression Mode: Verbal Expression: 5-Expresses basic needs/ideas: With extra time/assistive device Social Interaction Social Interaction: 5-Interacts appropriately 90% of the time - Needs monitoring or encouragement for participation or interaction. Problem Solving Problem Solving: 5-Solves basic 90% of the time/requires cueing < 10% of the time Memory Memory: 5-Recognizes or recalls 90% of the time/requires cueing < 10% of the  time  Pain Pain Assessment Pain Assessment: 0-10 Pain Score: 7  Pain Type: Acute pain Pain Location: Shoulder Pain Orientation: Right;Left Pain Descriptors / Indicators: Aching;Sore Pain Onset: With Activity Pain Intervention(s): Repositioned;Rest  Therapy/Group: Individual Therapy  [name] 11/02/2014, 4:07 PM

## 2014-11-02 NOTE — Plan of Care (Signed)
Problem: RH Leisure Awareness Goal: LTG: Patient will participate in leisure activities (TR) LTG: Patient will participate in leisure activities (simple/moderate/difficult) to increase ability to functionally perform activity, identify and utilize resources, identify new leisure interests, utilize adaptive equipment at specific level (TR)  Outcome: Not Applicable Date Met:  27/78/24 Goal discontinued due to pt with poor activity tolerance. Placed on HOLD for TR services.

## 2014-11-02 NOTE — Progress Notes (Signed)
Occupational Therapy Session Note  Patient Details  Name: [name] MRN: [id number] Date of Birth: March 06, 1951  Today's Date: 11/02/2014 OT Individual Time: 0700-0800 and 1030-1100 OT Individual Time Calculation (min): 60 min and 30 min     Short Term Goals: Week 2:  OT Short Term Goal 1 (Week 2): Pt will complete LB dressing with min assist OT Short Term Goal 2 (Week 2): Pt will complete toilet task with steadying assist for balance only OT Short Term Goal 3 (Week 2): Pt will complete toilet transfer at supervision level OT Short Term Goal 4 (Week 2): Pt will utilize LUE at gross assist level during self-care task with min cues  Skilled Therapeutic Interventions/Progress Updates:    Session 1: Pt seen for ADL retraining with focus on functional use of LUE, functional mobility, standing balance, problem solving, and activity tolerance. Pt received sitting in recliner. Ambulated room>bathroom with min A using RW and min cues for safety. Pt completed bathing at shower level with min A overall and hand over hand assist to LUE for washing RUE. Pt demonstrating increased initiation of utilizing LUE at gross assist level to wring wash cloth as he required no cues. Pt utilized LH sponge for washing BLEs and back. Completed dressing sit<>stand with min A from w/c. Pt required min cues for hemi dressing technique.Pt utilized reacher to complete LB dressing with increased time, requiring max A overall to complete and min cues for sequencing with reacher. At end of session pt left sitting in w/c with all needs in reach.   Session 2: Pt seen for 1:1 OT session with focus on functional transfers, sit<>stand, standing balance, and activity tolerance. Pt received sitting in recliner chair. Completed sit>stand with mod A and ambulated to sink to complete oral care in standing at SBA level for balance. Pt required long rest break in w/c then completed stand pivot transfer w/c>recliner with min A using RW.  Completed sit<>stand from recliner 4x with focus on sequencing for scooting and functional use of LUE. Pt required mod A and max cues initially for scooting, progressing to min A. Pt completed sit<>stand from recliner with mod A initially then progressed to supervision for final sit>stand. Pt required mod cues throughout activity for positioning of LUE. Pt transferred to bed then left supine with all needs in reach. Pt required frequent rest breaks throughout session due to fatigue.   Therapy Documentation Precautions:  Precautions Precautions: Fall Restrictions Weight Bearing Restrictions: No General:   Vital Signs: Therapy Vitals Temp: 98.4 F (36.9 C) Temp Source: Oral Pulse Rate: 78 Resp: 17 BP: 120/74 mmHg Patient Position (if appropriate): Sitting Oxygen Therapy SpO2: 92 % O2 Device: Not Delivered Pain: Pain Assessment Pain Assessment: 0-10 Pain Score: Asleep Pain Type: Acute pain Pain Location: Neck Pain Orientation: Left;Posterior Pain Descriptors / Indicators: Aching;Sharp Pain Frequency: Intermittent Pain Onset: On-going Patients Stated Pain Goal: 3 Pain Intervention(s): Medication (See eMAR);Repositioned ADL:   Exercises:   Other Treatments:    See FIM for current functional status  Therapy/Group: Individual Therapy  Duayne Cal 11/02/2014, 7:47 AM

## 2014-11-02 NOTE — Progress Notes (Signed)
Physical Therapy Weekly Progress Note  Patient Details  Name: [name] MRN: [id number] Date of Birth: 1950-10-22  Beginning of progress report period: October 26, 2014 End of progress report period: November 02, 2014  Today's Date: 11/02/2014 PT Individual Time: 1300-1400 PT Individual Time Calculation (min): 60 min   Patient has met 1 of 5 short term goals.  Patient has made slow progress this reporting period due to medical issues (ascites and swelling). Anticipate patient will begin making more gains with functional mobility following medical management of swelling/excess fluid. Patient currently requires min A overall for functional transfers and short distance ambulation. However patient has required +2 A for safety due to IV pole management and intermittent knee buckling, requiring cuing from therapist to safely terminate activities.   Patient continues to demonstrate the following deficits: muscle weakness, edema, LUE > LLE hemiplegia, neck pain and decreased ROM, decreased endurance, decreased standing balance, decreased balance strategies, decreased safety awareness, and therefore will continue to benefit from skilled PT intervention to enhance overall performance with activity tolerance, balance, postural control, ability to compensate for deficits, functional use of  left upper extremity and left lower extremity, awareness and coordination.  Patient progressing toward long term goals.  Continue plan of care.  PT Short Term Goals Week 1:  PT Short Term Goal 1 (Week 1): Patient will perform supine <> sit with supervision PT Short Term Goal 1 - Progress (Week 1): Progressing toward goal PT Short Term Goal 2 (Week 1): Patient will perform bed to wheelchair transfer with supervision. PT Short Term Goal 2 - Progress (Week 1): Progressing toward goal PT Short Term Goal 3 (Week 1): Patient will ambulate 50 feet with rolling walker and min assist PT Short Term Goal 3 - Progress (Week 1):  Met PT Short Term Goal 4 (Week 1): Patient will propel wheelchair 150 feet in controlled environment PT Short Term Goal 4 - Progress (Week 1): Not met PT Short Term Goal 5 (Week 1): Patient will ambulate up and down 8 steps with bilateral railings and min assist. PT Short Term Goal 5 - Progress (Week 1): Progressing toward goal Week 2:  PT Short Term Goal 1 (Week 2): = LTGs due to anticipated LOS  Skilled Therapeutic Interventions/Progress Updates:   Session focused on transfers, ambulation, strengthening, and activity tolerance. Patient received semi reclined in bed and transferred to edge of bed with min A. Gait using RW room > therapy gym x 156 ft with close supervision/assist for IV pole management. NuStep using BLE and RUE at level 4 x 10 min for NMR and overall activity tolerance. Patient practiced ambulation in home environment using RW in ADL apartment with supervision/assist for IV pole and practiced bed mobility x 2 on flat bed without rails. Patient required total verbal cues for sequencing and min A overall to initiate elevating trunk from bed. Patient ambulated back to room from ADL apartment as above and left sitting in recliner with  BLE elevated, needs within reach, and wife in room.   Therapy Documentation Precautions:  Precautions Precautions: Fall Restrictions Weight Bearing Restrictions: No Pain: Pain Assessment Pain Assessment: 0-10 Pain Score: 7  Pain Type: Acute pain Pain Location: Shoulder Pain Orientation: Right;Left Pain Descriptors / Indicators: Aching;Sore Pain Onset: With Activity Pain Intervention(s): Repositioned;Rest  See FIM for current functional status  Therapy/Group: Individual Therapy  Laretta Alstrom 11/02/2014, 3:45 PM

## 2014-11-02 NOTE — Progress Notes (Signed)
Daily Rounding Note  11/02/2014, 8:29 AM  LOS: 8 days   SUBJECTIVE:       Urine output 2 liters yesterday, 2.5 liters 4/26. Belly getting tighter again along with swelling, discomfort in left lower leg/foot.  No dyspnea.  Eating 100% of meals. Used 20 mg of oxycodone yesterday, 10 mg so far today: for neck pain. No BMs  OBJECTIVE:         Vital signs in last 24 hours:    Temp:  [98.4 F (36.9 C)-98.5 F (36.9 C)] 98.4 F (36.9 C) (04/28 0500) Pulse Rate:  [57-78] 78 (04/28 0500) Resp:  [16-17] 17 (04/28 0500) BP: (103-120)/(56-74) 120/74 mmHg (04/28 0645) SpO2:  [92 %-98 %] 92 % (04/28 0500) Weight:  [219 lb 9.3 oz (99.6 kg)] 219 lb 9.3 oz (99.6 kg) (04/28 0249) Last BM Date: 11/01/14 Filed Weights   11/01/14 0528 11/01/14 1945 11/02/14 0249  Weight: 208 lb 8.9 oz (94.6 kg) 219 lb 9.3 oz (99.6 kg) 219 lb 9.3 oz (99.6 kg)   General: frail, alert   Heart: RRR Chest: clear bil.  No cough or dyspnea Abdomen: tense, distended, NT, BS hypoactive  Extremities: 2 to 3+ edema l>>r foot Neuro/Psych:  +psychomotor slowing, no asterixis. Moves all 4 limbs.   Intake/Output from previous day: 04/27 0701 - 04/28 0700 In: 360 [P.O.:360] Out: 2050 [Urine:2050]  Intake/Output this shift:    Lab Results: No results for input(s): WBC, HGB, HCT, PLT in the last 72 hours. BMET  Recent Labs  10/31/14 0545 11/01/14 0520 11/02/14 0240  NA 128* 128* 130*  K 4.7 4.3 4.4  CL 95* 96 98  CO2 28 26 27   GLUCOSE 224* 172* 184*  BUN 12 9 10   CREATININE 0.50 0.58 0.53  CALCIUM 8.1* 8.4 8.5   LFT  Recent Labs  10/30/14 1610  PROT 6.4  ALBUMIN 1.7*  AST 29  ALT 16  ALKPHOS 88  BILITOT 1.4*    Studies/Results: US Paracentesis  10/31/2014   INDICATION: Cirrhosis, recurrent ascites and request for paracentesis.  EXAM: ULTRASOUND-GUIDED PARACENTESIS  COMPARISON:  [place] Abd limited 10/30/14.  MEDICATIONS: None.  COMPLICATIONS:  None immediate  TECHNIQUE: Informed written consent was obtained from the patient after a discussion of the risks, benefits and alternatives to treatment. A timeout was performed prior to the initiation of the procedure.  Initial ultrasound scanning demonstrates a large amount of ascites within the left lower abdominal quadrant. The left lower abdomen was prepped and draped in the usual sterile fashion. 1% lidocaine was used for local anesthesia.  An ultrasound image was saved for documentation purposed. A 6 Fr Safe-T-Centesis catheter was introduced. The paracentesis was performed. The catheter was removed and a dressing was applied. The patient tolerated the procedure well without immediate post procedural complication.  FINDINGS: A total of maximum requested 3 liters of serous fluid was removed. Samples were sent to the laboratory as requested by the clinical team.  IMPRESSION: Successful ultrasound-guided paracentesis yielding 3 liters of peritoneal fluid.  Read By:  Tsosie Billing PA-C   Electronically Signed   By: Corrie Mckusick D.O.   On: 10/31/2014 12:55   Scheduled Meds: . cefTRIAXone (ROCEPHIN)  IV  2 g Intravenous Q12H  . feeding supplement (PRO-STAT SUGAR FREE 64)  30 mL Oral BID  . furosemide  20 mg Oral Daily  . glimepiride  4 mg Oral Q breakfast  . insulin aspart  0-15 Units Subcutaneous TID  WC  . insulin aspart  0-5 Units Subcutaneous QHS  . lactulose  20 g Oral BID  . lidocaine  2 patch Transdermal Q24H  . linagliptin  5 mg Oral Daily  . pantoprazole  40 mg Oral q morning - 10a  . propranolol  20 mg Oral q morning - 10a  . simethicone  160 mg Oral QID  . [START ON 11/03/2014] spironolactone  100 mg Oral Daily  . Vitamin D (Ergocalciferol)  50,000 Units Oral Q Mon   Continuous Infusions:  PRN Meds:.sodium chloride, acetaminophen, alum & mag hydroxide-simeth, benzonatate, bisacodyl, diphenhydrAMINE, guaiFENesin-dextromethorphan, methocarbamol, morphine, naLOXone (NARCAN)  injection,  ondansetron **OR** ondansetron (ZOFRAN) IV, oxyCODONE, sodium chloride, sodium phosphate, traZODone   ASSESMENT:   * Long-standing cirrhosis due to [place]. Up-to-date with MRI screening for [place]. AFP level 10/13/2014 8.8.  Received A/hep B vaccination series in 2015.  4/16 PT/INR 17/1.3 so doing well from synthetic parameter.   * Ascites. Paracentesis x 3 liters 4/26: no evidence SBP. Due to undefined fluid albumin level unable to calculate SAAG. Low dose aldactone/lasix (50/20) initiated 4/25. Weight overall up 9 # in 24 hours, but stable from 4/20.  However weights are on bed scale.    * Hyponatremia.  Improved. Need to monitor closely since now on diuretics. Stable x 48 hours. Renal function remains in normal limits. .   * Long-standing history of esophageal varices, portal hypertension and portal colopathy. Up to date with screening EGD as of 06/2014 (no banding required). On chronic propranolol.  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 On prolonged course of Rocephin. Taking oxycodone for neck and left shoulder pain.   * Constipation.   * Type 2 diabetes. On multiple oral agents prior to admission    PLAN   *  Increase aldactone to 100 mg daily.  Watch BP and electrolytes, renal function.  If BP drops or pt has orthostatic sxs, drop Propanolol to 10 mg daily.   *  Need to make sure standing, not bed scale, weights are obtained   *  Maintain on BID Lactulose, but if no BM by tomorrow, will up the dose.     Azucena Freed  11/02/2014, 8:29 AM Pager: [phone]

## 2014-11-03 ENCOUNTER — Inpatient Hospital Stay (HOSPITAL_COMMUNITY): Payer: Managed Care, Other (non HMO) | Admitting: Speech Pathology

## 2014-11-03 ENCOUNTER — Inpatient Hospital Stay (HOSPITAL_COMMUNITY): Payer: Managed Care, Other (non HMO) | Admitting: Occupational Therapy

## 2014-11-03 ENCOUNTER — Inpatient Hospital Stay (HOSPITAL_COMMUNITY): Payer: Managed Care, Other (non HMO) | Admitting: Physical Therapy

## 2014-11-03 LAB — BASIC METABOLIC PANEL
ANION GAP: 7 (ref 5–15)
BUN: 12 mg/dL (ref 6–23)
CO2: 27 mmol/L (ref 19–32)
Calcium: 8.6 mg/dL (ref 8.4–10.5)
Chloride: 97 mmol/L (ref 96–112)
Creatinine, Ser: 0.67 mg/dL (ref 0.50–1.35)
GFR calc Af Amer: >90 mL/min (ref >90)
GFR calc non Af Amer: >90 mL/min (ref >90)
GLUCOSE: 147 mg/dL — AB (ref 70–99)
Potassium: 4.1 mmol/L (ref 3.5–5.1)
SODIUM: 131 mmol/L — AB (ref 135–145)

## 2014-11-03 LAB — GLUCOSE, CAPILLARY
Glucose-Capillary: 142 mg/dL — ABNORMAL HIGH (ref 70–99)
Glucose-Capillary: 178 mg/dL — ABNORMAL HIGH (ref 70–99)
Glucose-Capillary: 189 mg/dL — ABNORMAL HIGH (ref 70–99)
Glucose-Capillary: 136 mg/dL — ABNORMAL HIGH (ref 70–99)

## 2014-11-03 MED ORDER — MORPHINE SULFATE ER 15 MG PO TBCR
15.0000 mg | EXTENDED_RELEASE_TABLET | Freq: Once | ORAL | Status: AC
  Administered 2014-11-03: 15 mg via ORAL
  Filled 2014-11-03: qty 1

## 2014-11-03 MED ORDER — MORPHINE SULFATE ER 15 MG PO TBCR
15.0000 mg | EXTENDED_RELEASE_TABLET | Freq: Two times a day (BID) | ORAL | Status: DC
  Filled 2014-11-03: qty 1

## 2014-11-03 MED ORDER — MORPHINE SULFATE ER 15 MG PO TBCR: 15.0000 mg | EXTENDED_RELEASE_TABLET | Freq: Two times a day (BID) | ORAL | Status: DC

## 2014-11-03 MED ORDER — MORPHINE SULFATE ER 15 MG PO TBCR
15.0000 mg | EXTENDED_RELEASE_TABLET | Freq: Two times a day (BID) | ORAL | Status: DC
  Administered 2014-11-03 – 2014-11-11 (×16): 15 mg via ORAL
  Filled 2014-11-03 (×16): qty 1

## 2014-11-03 NOTE — Progress Notes (Signed)
Called to room by patient . Patient reported stiffing nose and coughing up moderate amount of bloody phlegm. Patient reports he has had this happen in the past since hospitalization but not at home. Instructed patient to notify RN if coughing up blood noted. Continue with plan of care.  Mliss Sax

## 2014-11-03 NOTE — Progress Notes (Signed)
Daily Rounding Note  11/03/2014, 10:06 AM  LOS: 9 days   SUBJECTIVE:       Feels a little stronger, still tires easily and persistent left sided weakness.  Large, incontinent stool yesterday, his first in a few days.  Appetite fair, eats 50 to 100% of meals  OBJECTIVE:         Vital signs in last 24 hours:    Temp:  [98.8 F (37.1 C)-99.4 F (37.4 C)] 99.4 F (37.4 C) (04/29 0546) Pulse Rate:  [64-81] 81 (04/29 0546) Resp:  [18] 18 (04/29 0546) BP: (107-119)/(60-67) 107/60 mmHg (04/29 0546) SpO2:  [94 %] 94 % (04/29 0546) Weight:  [202 lb 1.6 oz (91.672 kg)] 202 lb 1.6 oz (91.672 kg) (04/29 0546) Last BM Date: 11/01/14 Filed Weights   11/01/14 1945 11/02/14 0249 11/03/14 0546  Weight: 219 lb 9.3 oz (99.6 kg) 219 lb 9.3 oz (99.6 kg) 202 lb 1.6 oz (91.672 kg)   General: looks stronger, still pale and somwhat weak.  Comfortable, NAD   Heart: RRR Chest: BS absent in left base, no advetitious sounds, no dyspnea, no cough Abdomen: soft, less distended, NT.  Active BS.    GU:  Scrotal edema improved but persists.  About the size of a Lacrosse ball today, down from Softball and then baseball sized.  Extremities: 2 to 3 plus bil LE edema Neuro/Psych:  Oriented x 3, less psychomotor retardation.   Intake/Output from previous day: 04/28 0701 - 04/29 0700 In: 610 [P.O.:600; I.V.:10] Out: 1200 [Urine:1200]  Intake/Output this shift: Total I/O In: 360 [P.O.:360] Out: -   Lab Results: No results for input(s): WBC, HGB, HCT, PLT in the last 72 hours. BMET  Recent Labs  11/01/14 0520 11/02/14 0240 11/03/14 0430  NA 128* 130* 131*  K 4.3 4.4 4.1  CL 96 98 97  CO2 26 27 27   GLUCOSE 172* 184* 147*  BUN 9 10 12   CREATININE 0.58 0.53 0.67  CALCIUM 8.4 8.5 8.6   Scheduled Meds: . cefTRIAXone (ROCEPHIN)  IV  2 g Intravenous Q12H  . feeding supplement (PRO-STAT SUGAR FREE 64)  30 mL Oral BID  . furosemide  20 mg Oral  Daily  . glimepiride  4 mg Oral Q breakfast  . insulin aspart  0-15 Units Subcutaneous TID WC  . insulin aspart  0-5 Units Subcutaneous QHS  . lactulose  20 g Oral BID  . lidocaine  2 patch Transdermal Q24H  . linagliptin  5 mg Oral Daily  . morphine  15 mg Oral Q12H  . pantoprazole  40 mg Oral q morning - 10a  . propranolol  20 mg Oral q morning - 10a  . simethicone  160 mg Oral QID  . spironolactone  100 mg Oral Daily  . Vitamin D (Ergocalciferol)  50,000 Units Oral Q Mon   Continuous Infusions:  PRN Meds:.sodium chloride, acetaminophen, alum & mag hydroxide-simeth, benzonatate, bisacodyl, diphenhydrAMINE, guaiFENesin-dextromethorphan, methocarbamol, naLOXone (NARCAN)  injection, ondansetron **OR** ondansetron (ZOFRAN) IV, oxyCODONE, sodium chloride, sodium phosphate, traZODone   ASSESMENT:   * Long-standing cirrhosis due to [place]. Up-to-date with MRI screening for [place]. AFP level 10/13/2014 8.8.  Received A/hep B vaccination series in 2015.  4/16 PT/INR 17/1.3 so doing well from synthetic parameter.   * Ascites. Paracentesis x 3 liters 4/26: no evidence SBP. Due to undefined fluid albumin level unable to calculate SAAG. Low dose aldactone/lasix (50/20) initiated 4/25, Aldactone increased to 100 mg 4/28.This  morning's weight reflects a 17 pound loss compared with yesterday. However today's is a standing weight, yesterday's was bed scale so accuracy of yesterday's weight is not reliable.  * Hyponatremia. Steadily improving even in setting of added diuretics.  Renal function remains in normal limits. .   * Long-standing history of esophageal varices, portal hypertension and portal colopathy. Up to date with screening EGD as of 06/2014 (no banding required). On chronic propranolol.  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 Left sided weakness.  On prolonged course of Rocephin. Taking oxycodone for neck and left shoulder pain.   * Constipation, due to narcotics.  Added Lactulose bid on 4/26 for this and for [place]ia level of 56 on 4/21 and his sleepiness which might represent early HE. .    * Type 2 diabetes. On multiple oral agents prior to admission     PLAN   *  Increase Lasix to 40 mg daily.  Monitor BMET, daily weights. If BP drops too low or pt has orthostatic sxs, would reduce Propanol to 10 mg daily and try to maintain the lasix/aldactone at 40/100. Keep on BID Lactulose unless excessive stools in which case could lower to 30 ml once daily.  Will recheck pt 4/31.  Dr Deatra Ina available prn on the weekend.      Azucena Freed  11/03/2014, 10:06 AM Pager: [phone]

## 2014-11-03 NOTE — Progress Notes (Addendum)
[place] PHYSICAL MEDICINE & REHABILITATION     PROGRESS NOTE    Subjective/Complaints: Neck still sore. lidoderm patches don't make too much of a difference. Had loose stool x 2 yesterday  Objective: Vital Signs: Blood pressure 107/60, pulse 81, temperature 99.4 F (37.4 C), temperature source Oral, resp. rate 18, height 5\' 10"  (1.778 m), weight 91.672 kg (202 lb 1.6 oz), SpO2 94 %. No results found. No results for input(s): WBC, HGB, HCT, PLT in the last 72 hours.  Recent Labs  11/02/14 0240 11/03/14 0430  NA 130* 131*  K 4.4 4.1  CL 98 97  GLUCOSE 184* 147*  BUN 10 12  CREATININE 0.53 0.67  CALCIUM 8.5 8.6   CBG (last 3)   Recent Labs  11/02/14 1627 11/02/14 2110 11/03/14 0658  GLUCAP 167* 170* 142*    Wt Readings from Last 3 Encounters:  11/03/14 91.672 kg (202 lb 1.6 oz)  06/13/14 88.905 kg (196 lb)  02/07/14 89.812 kg (198 lb)    Physical Exam:  Nursing note and vitals reviewed. Constitutional: He is oriented to person, place, and time. He appears well-developed and well-nourished.  HENT:  Head: Normocephalic and atraumatic.  Eyes: Conjunctivae are normal. Pupils are equal, round, and reactive to light.  Neck: Muscular tenderness present. Decreased range of motion present.     Cardiovascular: Normal rate and regular rhythm.  Respiratory: Effort normal and breath sounds normal. No respiratory distress. He has no wheezes.  GI: Abdomen softer but sill with ongoing ascites/ distention. Bowel sounds are improved. There is no tenderness.  Musculoskeletal: He exhibits edema (1++ edema pedally). Mild scrotal edema. Left shoulder sublux Neck pain with ROM bilateral hips and LUE.  Neurological: He is alert and oriented to person, place, and time. Coordination abnormal.  Left inattention due to neck and shoulder pain limiting ROM.Marland Kitchen   speech clear and is able to follow basic commands without difficulty. RUE 4+/5. L- deltoid 2/5,2+ to 3- biceps, 3+ to 4  triceps, intrinsics 4+/5. BLE 3+/5 with ataxia. No obvious sensory loss to LT/P.  DTR's 1+  Skin: Skin is warm and dry.    Psychiatric: His speech is normal.  He's alert Uro: foley in place. No breakdown  Assessment/Plan: 1. Functional deficits secondary to cervical epidural abscess which require 3+ hours per day of interdisciplinary therapy in a comprehensive inpatient rehab setting. Physiatrist is providing close team supervision and 24 hour management of active medical problems listed below. Physiatrist and rehab team continue to assess barriers to discharge/monitor patient progress toward functional and medical goals.  FIM: FIM - Bathing Bathing Steps Patient Completed: Chest, Abdomen, Front perineal area, Buttocks, Right upper leg, Left upper leg, Left Arm, Left lower leg (including foot), Right lower leg (including foot) Bathing: 4: Min-Patient completes 8-9 59f 10 parts or 75+ percent  FIM - Upper Body Dressing/Undressing Upper body dressing/undressing steps patient completed: Thread/unthread right sleeve of pullover shirt/dresss, Put head through opening of pull over shirt/dress Upper body dressing/undressing: 3: Mod-Patient completed 50-74% of tasks FIM - Lower Body Dressing/Undressing Lower body dressing/undressing steps patient completed: Thread/unthread right pants leg, Thread/unthread left pants leg Lower body dressing/undressing: 2: Max-Patient completed 25-49% of tasks  FIM - Toileting Toileting: 1: Total-Patient completed zero steps, helper did all 3  FIM - Radio producer Devices: Walker, Elevated toilet seat Toilet Transfers: 4-To toilet/BSC: Min A (steadying Pt. > 75%), 3-From toilet/BSC: Mod A (lift or lower assist)  FIM - Control and instrumentation engineer Devices:  Walker, Arm rests Bed/Chair Transfer: 4: Supine > Sit: Min A (steadying Pt. > 75%/lift 1 leg), 5: Chair or W/C > Bed: Supervision (verbal cues/safety issues), 5:  Bed > Chair or W/C: Supervision (verbal cues/safety issues), 5: Sit > Supine: Supervision (verbal cues/safety issues)  FIM - Locomotion: Wheelchair Locomotion: Wheelchair: 0: Activity did not occur FIM - Locomotion: Ambulation Locomotion: Ambulation Assistive Devices: Administrator Ambulation/Gait Assistance: 5: Supervision Locomotion: Ambulation: 5: Travels 150 ft or more with supervision/safety issues  Comprehension Comprehension Mode: Auditory Comprehension: 5-Follows basic conversation/direction: With extra time/assistive device  Expression Expression Mode: Verbal Expression: 5-Expresses basic needs/ideas: With extra time/assistive device  Social Interaction Social Interaction: 5-Interacts appropriately 90% of the time - Needs monitoring or encouragement for participation or interaction.  Problem Solving Problem Solving: 5-Solves basic 90% of the time/requires cueing < 10% of the time  Memory Memory: 5-Recognizes or recalls 90% of the time/requires cueing < 10% of the time  Medical Problem List and Plan: 1. 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  -IV ceftriaxone at least 8 weeks with follow up MRI at that time 2. DVT Prophylaxis/Anticoagulation: Mechanical:  Antiembolism stockings, knee (TED hose) Bilateral lower extremities Sequential compression devices, below knee Bilateral lower extremities 3. Pain Management: will add low dose ms contin and observe for effect---may not be able to tolerate however.  -continue oxycodone 5mg  prn  .  -lidoderm, ice/heat, posture 4. Mood:   provide ego support.  5. Neuropsych: This patient is capable of making decisions on his own behalf. 6. Skin/Wound Care: Routine pressure relief measures. Maintain adequate nutritional support.  7. Fluids/Electrolytes/Nutrition: hyponatremia likely multifactorial, daily bmet for now  8. 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 Ascites on u/s, abdominal distention on exam   -spironolactone/lasix per GI  -bowel movements yesterday x 2  -weight at 91.6kg 9. Thrombocytopenia: Stable at 26 on most recent check.  --re-check tomorrow 10. DM type 2: Hgb A1c- 8.7. Monitor BS with ac/hs checks and will use SSI for elevated BS.   - resumed Tradjenta as was on Invokana and Tradjenta at home.  -some improvement  11. Reactive leucocytosis: Slowly resolving with treatment of infection.  12. Ileus: + results with SSE/program.    -d/c'ed PO reglan 13. Urine retention: foley (with diuretic on board)  -ucx negative    LOS (Days) 9 A FACE TO FACE EVALUATION WAS PERFORMED  SWARTZ,ZACHARY T 11/03/2014 8:05 AM

## 2014-11-03 NOTE — Progress Notes (Signed)
Occupational Therapy Session Note  Patient Details  Name: [name] MRN: [id number] Date of Birth: [birth year]  Today's Date: 11/03/2014 OT Individual Time: 0930-1030 OT Individual Time Calculation (min): 60 min    Short Term Goals: Week 1:  OT Short Term Goal 1 (Week 1): Pt will complete LB dressing at supervision level OT Short Term Goal 1 - Progress (Week 1): Not met OT Short Term Goal 2 (Week 1): Pt will complete toilet transfer at supervision level OT Short Term Goal 2 - Progress (Week 1): Not met OT Short Term Goal 3 (Week 1): Pt will complete functional activity in standing for 3 min to increase activity tolerance OT Short Term Goal 3 - Progress (Week 1): Met Week 2:  OT Short Term Goal 1 (Week 2): Pt will complete LB dressing with min assist OT Short Term Goal 2 (Week 2): Pt will complete toilet task with steadying assist for balance only OT Short Term Goal 3 (Week 2): Pt will complete toilet transfer at supervision level OT Short Term Goal 4 (Week 2): Pt will utilize LUE at gross assist level during self-care task with min cues  Skilled Therapeutic Interventions/Progress Updates:    Pt. Lying in bed upon OT arrival.  Pt went from supine to sit with HOB raised.  Pt sat EOB with SBA while doffing shirt.  Addressed  bed mobility, balance, activity tolerance/endurance,  strengthening. cues for initiating functional use of LUE, transfers.  Pt completed bathing at shower level with min A overall and hand over hand assist  LUE for washing RUE. Pt demonstrating increased initiation of utilizing LUE at gross assist level to hold wash cloth as he required no cues. Pt utilized LH sponge for washing BLEs and back. Completed dressing sit<>stand with min A from w/c. Pt required min cues for hemi dressing technique .  Utilized AE for LB bathing and dressing with minimal assist.  At end of session pt left sitting in w/c with all needs in reach.   Therapy Documentation Precautions:   Precautions Precautions: Fall Restrictions Weight Bearing Restrictions: No      Pain:  none             See FIM for current functional status  Therapy/Group: Individual Therapy  Lisa Roca 11/03/2014, 6:54 PM

## 2014-11-03 NOTE — Progress Notes (Signed)
Physical Therapy Session Note  Patient Details  Name: [name] MRN: [id number] Date of Birth: Jun 04, 1951  Today's Date: 11/03/2014 PT Individual Time: 1100-1200 PT Individual Time Calculation (min): 60 min   Short Term Goals: Week 2:  PT Short Term Goal 1 (Week 2): = LTGs due to anticipated LOS   Skilled Therapeutic Interventions/Progress Updates:   Session focused on functional mobility training, NMR, and overall activity tolerance. Patient received sitting in wheelchair. Gait using RW room > ortho gym > therapy gym with supervision/assist for IV pole, cues for upright posture and forward gaze. Patient performed car transfer to sedan height using RW with overall min A for sit > stand from car seat. NuStep using BUE/BLE at level 4 x 12 min, max verbal/tactile cues to maintain BLE in neutral alignment instead of adducting BLE with towels placed on bottom of feet due to pt report of sensitivity. Gait using RW with close supervision/assist for IV pole, gym > room x 156 ft. Patient requires greatly increased time to complete mobility tasks as well as frequent rest breaks due to fatigue. Patient left sitting in wheelchair with needs within reach, IV team present.    Therapy Documentation Precautions:  Precautions Precautions: Fall Restrictions Weight Bearing Restrictions: No Pain: Pain Assessment Pain Assessment: 0-10 Pain Score: 4  Pain Type: Acute pain Pain Location: Neck Pain Orientation: Right;Left Pain Descriptors / Indicators: Aching Pain Onset: On-going Patients Stated Pain Goal: 2 Pain Intervention(s): Medication (See eMAR);Emotional support;Repositioned  See FIM for current functional status  Therapy/Group: Individual Therapy  Laretta Alstrom 11/03/2014, 11:45 AM

## 2014-11-03 NOTE — Plan of Care (Signed)
Problem: RH PAIN MANAGEMENT Goal: RH STG PAIN MANAGED AT OR BELOW PT'S PAIN GOAL < 4  Outcome: Progressing Started on Morphine CR BID

## 2014-11-03 NOTE — Progress Notes (Signed)
Speech Language Pathology Daily Session Note  Patient Details  Name: [name] MRN: [id number] Date of Birth: [birth year]  Today's Date: 11/03/2014 SLP Individual Time: 2947-6546 SLP Individual Time Calculation (min): 62 min  Short Term Goals: Week 2: SLP Short Term Goal 1 (Week 2): Pt will utilize external/internal aids to facilitate recall of new information during functional tasks with supervision verbal and question cues.  SLP Short Term Goal 2 (Week 2): Pt will demonstrate mildly complex problem solving to complete functional tasks with supervision verbal and question cues  Skilled Therapeutic Interventions: Skilled treatment session focused on cognitive goals. Upon arrival, patient was awake while semi-reclined in bed and agreeable to participate in treatment session. SLP facilitated session by providing supervision question cues for recall of his current medications and their functions. Patient independently recalled 9/11 medication functions without use of external aid. Patient also demonstrated increased anticipatory awareness in regards to d/c planning by asking this clinician about follow-up procedures, etc.  Patient independently called his wife after the conversation to let her know the information which is indicative of increased awareness of memory deficits.   Patient also utilized schedule to anticipate future therapy sessions and independently recalled goals of individual therapy disciplines. Overall, patient appeared brighter and more engaged this session.  Patient left supine in bed with alarm on and all needs within reach. Continue with current plan of care.    FIM:  Comprehension Comprehension Mode: Auditory Comprehension: 5-Follows basic conversation/direction: With extra time/assistive device Expression Expression Mode: Verbal Expression: 5-Expresses basic needs/ideas: With extra time/assistive device Social Interaction Social Interaction: 5-Interacts appropriately  90% of the time - Needs monitoring or encouragement for participation or interaction. Problem Solving Problem Solving: 5-Solves basic 90% of the time/requires cueing < 10% of the time Memory Memory: 5-Recognizes or recalls 90% of the time/requires cueing < 10% of the time  Pain Pain Assessment Pain Assessment: 0-10 Pain Score: 4  Pain Type: Acute pain Pain Location: Neck Pain Orientation: Right;Left Pain Descriptors / Indicators: Aching Pain Onset: On-going Patients Stated Pain Goal: 2 Pain Intervention(s): Medication (See eMAR);Emotional support;Repositioned PAINAD (Pain Assessment in Advanced Dementia) Breathing: normal Negative Vocalization: none Facial Expression: smiling or inexpressive Body Language: relaxed Consolability: no need to console PAINAD Score: 0  Therapy/Group: Individual Therapy  Eldene Plocher 11/03/2014, 9:54 AM

## 2014-11-03 NOTE — Progress Notes (Signed)
Occupational Therapy Session Note  Patient Details  Name: [name] MRN: [id number] Date of Birth: 1950-11-06  Today's Date: 11/03/2014 OT Individual Time: 6063-0160 OT Individual Time Calculation (min): 32 min   Skilled Therapeutic Interventions/Progress Updates:    Therapist transported pt to the therapy gym via wheelchair for session.  Min assist for transfer to the therapy mat and to supine position.  Focused session on LUE strengthening.  Began with place and hold exercises with left arm at approximately 90 degrees shoulder flexion and elbow extension.   Pt able to maintain with supervision to min facilitation for 5 seconds.  Progressed to bilateral shoulder flexion using therapy ball and having pt hold position.  As well, pt needed min assist to achieve position of shoulder flexion at 90 degrees.  He was able to maintain after initial placement at 90 degrees for 3 seconds and then pt needs min assist to maintain.  Performed several repetitions of each exercise, between 5-10 repetitions.  Pt transferred back to wheelchair and back to his room at end of session.    Therapy Documentation Precautions:  Precautions Precautions: Fall Restrictions Weight Bearing Restrictions: No  Pain: Pain Assessment Pain Assessment: No/denies pain  See FIM for current functional status  Therapy/Group: Individual Therapy  Jalyn Dutta OTR/L 11/03/2014, 4:36 PM

## 2014-11-04 ENCOUNTER — Inpatient Hospital Stay (HOSPITAL_COMMUNITY): Payer: Managed Care, Other (non HMO) | Admitting: Occupational Therapy

## 2014-11-04 DIAGNOSIS — K5900 Constipation, unspecified: Secondary | ICD-10-CM

## 2014-11-04 DIAGNOSIS — N319 Neuromuscular dysfunction of bladder, unspecified: Secondary | ICD-10-CM

## 2014-11-04 LAB — GLUCOSE, CAPILLARY
GLUCOSE-CAPILLARY: 191 mg/dL — AB (ref 70–99)
GLUCOSE-CAPILLARY: 172 mg/dL — AB (ref 70–99)
Glucose-Capillary: 160 mg/dL — ABNORMAL HIGH (ref 70–99)
Glucose-Capillary: 155 mg/dL — ABNORMAL HIGH (ref 70–99)

## 2014-11-04 LAB — BODY FLUID CULTURE: CULTURE: NO GROWTH

## 2014-11-04 LAB — BASIC METABOLIC PANEL
Anion gap: 6 (ref 5–15)
BUN: 12 mg/dL (ref 6–23)
CALCIUM: 8.4 mg/dL (ref 8.4–10.5)
CHLORIDE: 96 mmol/L (ref 96–112)
CO2: 28 mmol/L (ref 19–32)
Creatinine, Ser: 0.56 mg/dL (ref 0.50–1.35)
GFR calc Af Amer: >90 mL/min (ref >90)
GFR calc non Af Amer: >90 mL/min (ref >90)
GLUCOSE: 211 mg/dL — AB (ref 70–99)
Potassium: 4.3 mmol/L (ref 3.5–5.1)
SODIUM: 130 mmol/L — AB (ref 135–145)

## 2014-11-04 LAB — CBC
HCT: 32.5 % — ABNORMAL LOW (ref 39.0–52.0)
HEMOGLOBIN: 10.9 g/dL — AB (ref 13.0–17.0)
MCH: 31.1 pg (ref 26.0–34.0)
MCHC: 33.5 g/dL (ref 30.0–36.0)
MCV: 92.6 fL (ref 78.0–100.0)
PLATELETS: 36 10*3/uL — AB (ref 150–400)
RBC: 3.51 MIL/uL — AB (ref 4.22–5.81)
RDW: 18.4 % — ABNORMAL HIGH (ref 11.5–15.5)
WBC: 6.3 10*3/uL (ref 4.0–10.5)

## 2014-11-04 MED ORDER — SENNOSIDES-DOCUSATE SODIUM 8.6-50 MG PO TABS
2.0000 | ORAL_TABLET | Freq: Two times a day (BID) | ORAL | Status: DC
  Administered 2014-11-04 – 2014-11-11 (×14): 2 via ORAL
  Filled 2014-11-04 (×17): qty 2

## 2014-11-04 MED ORDER — BISACODYL 10 MG RE SUPP
10.0000 mg | Freq: Every day | RECTAL | Status: DC
  Administered 2014-11-04: 10 mg via RECTAL
  Filled 2014-11-04 (×2): qty 1

## 2014-11-04 NOTE — Progress Notes (Signed)
[place] PHYSICAL MEDICINE & REHABILITATION     PROGRESS NOTE    Subjective/Complaints: Constipated, maybe I need an enema  Objective: Vital Signs: Blood pressure 150/76, pulse 64, temperature 98.2 F (36.8 C), temperature source Oral, resp. rate 18, height 5\' 10"  (1.778 m), weight 93.123 kg (205 lb 4.8 oz), SpO2 96 %. No results found.  Recent Labs  11/04/14 0620  WBC 6.3  HGB 10.9*  HCT 32.5*  PLT 36*    Recent Labs  11/03/14 0430 11/04/14 0620  NA 131* 130*  K 4.1 4.3  CL 97 96  GLUCOSE 147* 211*  BUN 12 12  CREATININE 0.67 0.56  CALCIUM 8.6 8.4   CBG (last 3)   Recent Labs  11/03/14 1656 11/03/14 2102 11/04/14 0638  GLUCAP 189* 136* 191*    Wt Readings from Last 3 Encounters:  11/04/14 93.123 kg (205 lb 4.8 oz)  06/13/14 88.905 kg (196 lb)  02/07/14 89.812 kg (198 lb)    Physical Exam:  Nursing note and vitals reviewed. Constitutional: He is oriented to person, place, and time. He appears well-developed and well-nourished.  HENT:  Head: Normocephalic and atraumatic.  Eyes: Conjunctivae are normal. Pupils are equal, round, and reactive to light.  Neck: Muscular tenderness present. Decreased range of motion present.     Cardiovascular: Normal rate and regular rhythm.  Respiratory: Effort normal and breath sounds normal. No respiratory distress. He has no wheezes.  GI: Abdomen softer but sill with ongoing ascites/ distention. Bowel sounds are improved. There is no tenderness.  Musculoskeletal: He exhibits edema (1++ edema pedally). Mild scrotal edema. Left shoulder sublux Neck pain with ROM bilateral hips and LUE.  Neurological: He is alert and oriented to person, place, and time. Coordination abnormal.  Left inattention due to neck and shoulder pain limiting ROM.Marland Kitchen   speech clear and is able to follow basic commands without difficulty. RUE 4+/5. L- deltoid 2/5, 3- biceps, 3+triceps, intrinsics 4+/5. BLE 3+/5 with ataxia. No obvious sensory  loss to LT/P.  DTR's 1+  Skin: Skin is warm and dry.    Psychiatric: His speech is normal.  He's alert   Assessment/Plan: 1. Functional deficits secondary to cervical epidural abscess which require 3+ hours per day of interdisciplinary therapy in a comprehensive inpatient rehab setting. Physiatrist is providing close team supervision and 24 hour management of active medical problems listed below. Physiatrist and rehab team continue to assess barriers to discharge/monitor patient progress toward functional and medical goals.  FIM: FIM - Bathing Bathing Steps Patient Completed: Chest, Abdomen, Front perineal area, Buttocks, Right upper leg, Left upper leg, Left Arm, Left lower leg (including foot), Right lower leg (including foot) Bathing: 4: Min-Patient completes 8-9 110f 10 parts or 75+ percent  FIM - Upper Body Dressing/Undressing Upper body dressing/undressing steps patient completed: Thread/unthread right sleeve of pullover shirt/dresss, Put head through opening of pull over shirt/dress Upper body dressing/undressing: 3: Mod-Patient completed 50-74% of tasks FIM - Lower Body Dressing/Undressing Lower body dressing/undressing steps patient completed: Thread/unthread right pants leg, Thread/unthread left pants leg Lower body dressing/undressing: 2: Max-Patient completed 25-49% of tasks  FIM - Toileting Toileting steps completed by patient: Performs perineal hygiene, Adjust clothing prior to toileting Toileting Assistive Devices: Grab bar or rail for support Toileting: 3: Mod-Patient completed 2 of 3 steps  FIM - Radio producer Devices: Walker, Elevated toilet seat Toilet Transfers: 4-To toilet/BSC: Min A (steadying Pt. > 75%), 3-From toilet/BSC: Mod A (lift or lower assist)  FIM -  Bed/Chair Financial planner Devices: Environmental consultant, Arm rests Bed/Chair Transfer: 5: Bed > Chair or W/C: Supervision (verbal cues/safety issues), 5: Chair or W/C >  Bed: Supervision (verbal cues/safety issues)  FIM - Locomotion: Wheelchair Locomotion: Wheelchair: 0: Activity did not occur FIM - Locomotion: Ambulation Locomotion: Ambulation Assistive Devices: Administrator Ambulation/Gait Assistance: 5: Supervision Locomotion: Ambulation: 5: Travels 150 ft or more with supervision/safety issues  Comprehension Comprehension Mode: Auditory Comprehension: 5-Follows basic conversation/direction: With extra time/assistive device  Expression Expression Mode: Verbal Expression: 5-Expresses basic needs/ideas: With extra time/assistive device  Social Interaction Social Interaction: 5-Interacts appropriately 90% of the time - Needs monitoring or encouragement for participation or interaction.  Problem Solving Problem Solving: 5-Solves basic 90% of the time/requires cueing < 10% of the time  Memory Memory: 5-Recognizes or recalls 90% of the time/requires cueing < 10% of the time  Medical Problem List and Plan: 1. 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  -IV ceftriaxone at least 8 weeks with follow up MRI at that time 2. DVT Prophylaxis/Anticoagulation: Mechanical:  Antiembolism stockings, knee (TED hose) Bilateral lower extremities Sequential compression devices, below knee Bilateral lower extremities 3. Pain Management: will add low dose ms contin and observe for effect---may not be able to tolerate however.  -continue oxycodone 5mg  prn  .  -lidoderm, ice/heat, posture 4. Mood:   provide ego support.  5. Neuropsych: This patient is capable of making decisions on his own behalf. 6. Skin/Wound Care: Routine pressure relief measures. Maintain adequate nutritional support.  7. Fluids/Electrolytes/Nutrition: hyponatremia likely multifactorial, daily bmet for now  8. 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 Ascites on u/s, abdominal  distention on exam   -spironolactone/lasix per GI  -bowel movements yesterday x 2  -weight at 91.6kg 9. Thrombocytopenia: Stable at 26 on most recent check.  --re-check tomorrow 10. DM type 2: Hgb A1c- 8.7. Monitor BS with ac/hs checks and will use SSI for elevated BS.   - resumed Tradjenta as was on Invokana and Tradjenta at home.  -some improvement  11. Reactive leucocytosis: Slowly resolving with treatment of infection.  12. Ileus: + results with SSE/program. Add dulcolax supp daily   -d/c'ed PO reglan 13. Urine retention: foley (with diuretic on board)  -ucx negative    LOS (Days) 10 A FACE TO FACE EVALUATION WAS PERFORMED  Nazifa Trinka E 11/04/2014 8:03 AM

## 2014-11-04 NOTE — Progress Notes (Signed)
Occupational Therapy Session Note  Patient Details  Name: [name] MRN: [id number] Date of Birth: Feb 02, 1951  Today's Date: 11/04/2014 OT Individual Time: 1450-1536 OT Individual Time Calculation (min): 46 min    Short Term Goals: Week 1:  OT Short Term Goal 1 (Week 1): Pt will complete LB dressing at supervision level OT Short Term Goal 1 - Progress (Week 1): Not met OT Short Term Goal 2 (Week 1): Pt will complete toilet transfer at supervision level OT Short Term Goal 2 - Progress (Week 1): Not met OT Short Term Goal 3 (Week 1): Pt will complete functional activity in standing for 3 min to increase activity tolerance OT Short Term Goal 3 - Progress (Week 1): Met Week 2:  OT Short Term Goal 1 (Week 2): Pt will complete LB dressing with min assist OT Short Term Goal 2 (Week 2): Pt will complete toilet task with steadying assist for balance only OT Short Term Goal 3 (Week 2): Pt will complete toilet transfer at supervision level OT Short Term Goal 4 (Week 2): Pt will utilize LUE at gross assist level during self-care task with min cues  Skilled Therapeutic Interventions/Progress Updates:    Pt seen this session to address functional mobility and L shoulder/elbow AROM.  Pt received in bed, TEDs donned. Pt sat to EOB with S. Ambulated with RW to gym. Sat on mat and worked with UE Ranger on sh flex/ext with and without trunk flexion. Placing and holding arm into flexion. Per pt request, applied hot pack to neck with light massage at back of neck.   Pt worked on A/AROM of elbow flex and resisted elbow extension. Continues to have 10 degrees of sh flex and 90 of elbow flex. Pt transported back to room and he transferred to recliner with min A.  Demonstrated to pt how to use tray table to perform AROM gravity eliminated exercises.  Pt resting with family member in the room.  Therapy Documentation Precautions:  Precautions Precautions: Fall Restrictions Weight Bearing Restrictions:  No  Pain: Pain Assessment Pain Assessment: No/denies pain ADL:  See FIM for current functional status  Therapy/Group: Individual Therapy  SAGUIER,JULIA 11/04/2014, 4:24 PM

## 2014-11-05 ENCOUNTER — Inpatient Hospital Stay (HOSPITAL_COMMUNITY): Payer: Managed Care, Other (non HMO)

## 2014-11-05 ENCOUNTER — Inpatient Hospital Stay (HOSPITAL_COMMUNITY): Payer: Managed Care, Other (non HMO) | Admitting: Physical Therapy

## 2014-11-05 DIAGNOSIS — R062 Wheezing: Secondary | ICD-10-CM

## 2014-11-05 LAB — GLUCOSE, CAPILLARY
GLUCOSE-CAPILLARY: 161 mg/dL — AB (ref 70–99)
GLUCOSE-CAPILLARY: 196 mg/dL — AB (ref 70–99)
GLUCOSE-CAPILLARY: 164 mg/dL — AB (ref 70–99)
GLUCOSE-CAPILLARY: 148 mg/dL — AB (ref 70–99)

## 2014-11-05 LAB — BASIC METABOLIC PANEL
Anion gap: 5 (ref 5–15)
BUN: 12 mg/dL (ref 6–20)
CALCIUM: 8.3 mg/dL — AB (ref 8.9–10.3)
CO2: 27 mmol/L (ref 22–32)
CREATININE: 0.59 mg/dL — AB (ref 0.61–1.24)
Chloride: 94 mmol/L — ABNORMAL LOW (ref 101–111)
GFR calc Af Amer: >60 mL/min (ref >60)
GFR calc non Af Amer: >60 mL/min (ref >60)
GLUCOSE: 165 mg/dL — AB (ref 70–99)
Potassium: 4.4 mmol/L (ref 3.5–5.1)
Sodium: 126 mmol/L — ABNORMAL LOW (ref 135–145)

## 2014-11-05 NOTE — Progress Notes (Signed)
Physical Therapy Session Note  Patient Details  Name: [name] MRN: [id number] Date of Birth: 1951/05/10  Today's Date: 11/05/2014 PT Individual Time: 1300-1400 PT Individual Time Calculation (min): 60 min   Short Term Goals: Week 1:  PT Short Term Goal 1 (Week 1): Patient will perform supine <> sit with supervision PT Short Term Goal 1 - Progress (Week 1): Progressing toward goal PT Short Term Goal 2 (Week 1): Patient will perform bed to wheelchair transfer with supervision. PT Short Term Goal 2 - Progress (Week 1): Progressing toward goal PT Short Term Goal 3 (Week 1): Patient will ambulate 50 feet with rolling walker and min assist PT Short Term Goal 3 - Progress (Week 1): Met PT Short Term Goal 4 (Week 1): Patient will propel wheelchair 150 feet in controlled environment PT Short Term Goal 4 - Progress (Week 1): Not met PT Short Term Goal 5 (Week 1): Patient will ambulate up and down 8 steps with bilateral railings and min assist. PT Short Term Goal 5 - Progress (Week 1): Progressing toward goal  Skilled Therapeutic Interventions/Progress Updates:  Pt was seen bedside in the pm. Pt transferred supine to edge of bed with head of bed elevated, side rail and min A with increased time. Pt transferred sit to stand from edge of bed with rolling walker and min guard. Pt ambulated about 75 feet with rolling walker and S to min guard with verbal cues. Performed step ups 6 sets x 5 reps for LE strengthening. Pt ambulated 10 feet x 2 with min guard to S. Performed toilet transfers with rolling walker and min guard to min A. Pt left sitting up in recliner chair with call bell within reach.   Therapy Documentation Precautions:  Precautions Precautions: Fall Restrictions Weight Bearing Restrictions: No General:   Pain: No c/o pain at rest.    Locomotion : Ambulation Ambulation/Gait Assistance: 5: Supervision;4: Min guard   See FIM for current functional status  Therapy/Group:  Individual Therapy  Dub Amis 11/05/2014, 2:38 PM

## 2014-11-05 NOTE — Progress Notes (Signed)
[facility]IC[facility] RE[facility]ILITATION     PROGRESS NOTE    Subjective/Complaints: Constipated, small results from enema, feels legs more swollen Occ cough prod of green sputum  Objective: Vital Signs: Blood pressure 118/67, pulse 61, temperature 98 F (36.7 C), temperature source Oral, resp. rate 18, height 5\' 10"  (1.778 m), weight 93.3 kg (205 lb 11 oz), SpO2 99 %. No results found.  Recent Labs  11/04/14 0620  WBC 6.3  HGB 10.9*  HCT 32.5*  PLT 36*    Recent Labs  11/03/14 0430 11/04/14 0620  NA 131* 130*  K 4.1 4.3  CL 97 96  GLUCOSE 147* 211*  BUN 12 12  CREATININE 0.67 0.56  CALCIUM 8.6 8.4   CBG (last 3)   Recent Labs  11/04/14 1628 11/04/14 2050 11/05/14 0656  GLUCAP 160* 155* 161*    Wt Readings from Last 3 Encounters:  11/05/14 93.3 kg (205 lb 11 oz)  06/13/14 88.905 kg (196 lb)  02/07/14 89.812 kg (198 lb)    Physical Exam:  Nursing note and vitals reviewed. Constitutional: He is oriented to person, place, and time. He appears well-developed and well-nourished.  HENT:  Head: Normocephalic and atraumatic.  Eyes: Conjunctivae are normal. Pupils are equal, round, and reactive to light.  Neck: Muscular tenderness present. Decreased range of motion present.     Cardiovascular: Normal rate and regular rhythm.  Respiratory: Effort normal and diminished basilar breath sounds . No respiratory distress. bilateral wheezes.  GI: Abdomen softer but sill with ongoing ascites/ distention. Bowel sounds are improved. There is no tenderness.  Musculoskeletal: He exhibits edema (1++ edema pedally). Mild scrotal edema. Left shoulder sublux Neck pain with ROM bilateral hips and LUE.  Neurological: He is alert and oriented to person, place, and time. Coordination abnormal.  Left inattention due to neck and shoulder pain limiting ROM.Marland Kitchen   speech clear and is able to follow basic commands without difficulty. RUE 4+/5. L- deltoid 2/5, 3- biceps,  3+triceps, intrinsics 4+/5. BLE 3+/5 with ataxia. No obvious sensory loss to LT/P.  DTR's 1+  Skin: Skin is warm and dry.    Psychiatric: His speech is normal.  He's alert   Assessment/Plan: 1. Functional deficits secondary to cervical epidural abscess which require 3+ hours per day of interdisciplinary therapy in a comprehensive inpatient rehab setting. Physiatrist is providing close team supervision and 24 hour management of active medical problems listed below. Physiatrist and rehab team continue to assess barriers to discharge/monitor patient progress toward functional and medical goals.  FIM: FIM - Bathing Bathing Steps Patient Completed: Chest, Abdomen, Front perineal area, Buttocks, Right upper leg, Left upper leg, Left Arm, Left lower leg (including foot), Right lower leg (including foot) Bathing: 4: Min-Patient completes 8-9 15f 10 parts or 75+ percent  FIM - Upper Body Dressing/Undressing Upper body dressing/undressing steps patient completed: Thread/unthread right sleeve of pullover shirt/dresss, Put head through opening of pull over shirt/dress Upper body dressing/undressing: 3: Mod-Patient completed 50-74% of tasks FIM - Lower Body Dressing/Undressing Lower body dressing/undressing steps patient completed: Thread/unthread right pants leg, Thread/unthread left pants leg Lower body dressing/undressing: 2: Max-Patient completed 25-49% of tasks  FIM - Toileting Toileting steps completed by patient: Performs perineal hygiene, Adjust clothing prior to toileting Toileting Assistive Devices: Grab bar or rail for support Toileting: 3: Mod-Patient completed 2 of 3 steps  FIM - Radio producer Devices: Walker, Elevated toilet seat Toilet Transfers: 4-To toilet/BSC: Min A (steadying Pt. > 75%), 3-From toilet/BSC:  Mod A (lift or lower assist)  FIM - Bed/Chair Transfer Bed/Chair Transfer Assistive Devices: Arm rests Bed/Chair Transfer: 4: Bed > Chair or  W/C: Min A (steadying Pt. > 75%), 4: Chair or W/C > Bed: Min A (steadying Pt. > 75%)  FIM - Locomotion: Wheelchair Locomotion: Wheelchair: 0: Activity did not occur FIM - Locomotion: Ambulation Locomotion: Ambulation Assistive Devices: Administrator Ambulation/Gait Assistance: 5: Supervision Locomotion: Ambulation: 5: Travels 150 ft or more with supervision/safety issues  Comprehension Comprehension Mode: Auditory Comprehension: 5-Follows basic conversation/direction: With no assist  Expression Expression Mode: Verbal Expression: 5-Expresses basic needs/ideas: With no assist  Social Interaction Social Interaction: 5-Interacts appropriately 90% of the time - Needs monitoring or encouragement for participation or interaction.  Problem Solving Problem Solving: 5-Solves basic 90% of the time/requires cueing < 10% of the time  Memory Memory: 5-Recognizes or recalls 90% of the time/requires cueing < 10% of the time  Medical Problem List and Plan: 1. 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 DVT Prophylaxis/Anticoagulation: Mechanical:  Antiembolism stockings, knee (TED hose) Bilateral lower extremities Sequential compression devices, below knee Bilateral lower extremities 3. Pain Management: will add low dose ms contin and observe for effect---may not be able to tolerate however.  -continue oxycodone 5mg  prn  .  -lidoderm, ice/heat, posture 4. Mood:   provide ego support.  5. Neuropsych: This patient is capable of making decisions on his own behalf. 6. Skin/Wound Care: Routine pressure relief measures. Maintain adequate nutritional support.  7. Fluids/Electrolytes/Nutrition: hyponatremia likely multifactorial, daily bmet for now  8. 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 Ascites on u/s, abdominal distention on exam    -spironolactone/lasix per GI  -bowel movements yesterday x 2  -weight at 91.6kg 9. Thrombocytopenia: Stable at 26 on most recent check.  --re-check tomorrow 10. DM type 2: Hgb A1c- 8.7. Monitor BS with ac/hs checks and will use SSI for elevated BS.   - resumed Tradjenta as was on Invokana and Tradjenta at home.  -some improvement  11. Reactive leucocytosis: Slowly resolving with treatment of infection.  12. Ileus: + results with SSE/program.no result from dulc supp  -d/c'ed PO reglan 13. Urine retention: foley (with diuretic on board)  -ucx negative 14.  Wheezing, no CP or SOB, check CXR, some greensish sputum , afeb   LOS (Days) 11 A FACE TO FACE EVALUATION WAS PERFORMED  Alysia Penna E 11/05/2014 7:35 AM

## 2014-11-06 ENCOUNTER — Inpatient Hospital Stay (HOSPITAL_COMMUNITY): Payer: Managed Care, Other (non HMO) | Admitting: Physical Therapy

## 2014-11-06 ENCOUNTER — Inpatient Hospital Stay (HOSPITAL_COMMUNITY): Payer: Managed Care, Other (non HMO)

## 2014-11-06 ENCOUNTER — Encounter (HOSPITAL_COMMUNITY): Payer: Managed Care, Other (non HMO)

## 2014-11-06 ENCOUNTER — Encounter (HOSPITAL_COMMUNITY): Payer: Self-pay | Admitting: Physician Assistant

## 2014-11-06 ENCOUNTER — Inpatient Hospital Stay (HOSPITAL_COMMUNITY): Payer: Managed Care, Other (non HMO) | Admitting: Speech Pathology

## 2014-11-06 DIAGNOSIS — K7460 Unspecified cirrhosis of liver: Secondary | ICD-10-CM

## 2014-11-06 LAB — GLUCOSE, CAPILLARY
GLUCOSE-CAPILLARY: 158 mg/dL — AB (ref 70–99)
GLUCOSE-CAPILLARY: 163 mg/dL — AB (ref 70–99)
Glucose-Capillary: 145 mg/dL — ABNORMAL HIGH (ref 70–99)
Glucose-Capillary: 133 mg/dL — ABNORMAL HIGH (ref 70–99)

## 2014-11-06 LAB — BASIC METABOLIC PANEL
Anion gap: 5 (ref 5–15)
BUN: 9 mg/dL (ref 6–20)
CO2: 28 mmol/L (ref 22–32)
CREATININE: 0.54 mg/dL — AB (ref 0.61–1.24)
Calcium: 8.5 mg/dL — ABNORMAL LOW (ref 8.9–10.3)
Chloride: 96 mmol/L — ABNORMAL LOW (ref 101–111)
GFR calc Af Amer: >60 mL/min (ref >60)
GFR calc non Af Amer: >60 mL/min (ref >60)
Glucose, Bld: 153 mg/dL — ABNORMAL HIGH (ref 70–99)
POTASSIUM: 4.1 mmol/L (ref 3.5–5.1)
SODIUM: 129 mmol/L — AB (ref 135–145)

## 2014-11-06 MED ORDER — FUROSEMIDE 40 MG PO TABS
40.0000 mg | ORAL_TABLET | Freq: Once | ORAL | Status: AC
  Administered 2014-11-06: 40 mg via ORAL
  Filled 2014-11-06: qty 1

## 2014-11-06 MED ORDER — FUROSEMIDE 40 MG PO TABS
40.0000 mg | ORAL_TABLET | Freq: Every day | ORAL | Status: DC
  Filled 2014-11-06: qty 1

## 2014-11-06 MED ORDER — GLIMEPIRIDE 2 MG PO TABS
2.0000 mg | ORAL_TABLET | Freq: Once | ORAL | Status: AC
  Administered 2014-11-06: 2 mg via ORAL
  Filled 2014-11-06: qty 1

## 2014-11-06 MED ORDER — FUROSEMIDE 80 MG PO TABS
80.0000 mg | ORAL_TABLET | Freq: Every day | ORAL | Status: DC
  Administered 2014-11-07 – 2014-11-11 (×5): 80 mg via ORAL
  Filled 2014-11-06 (×7): qty 1

## 2014-11-06 MED ORDER — GLIMEPIRIDE 4 MG PO TABS
6.0000 mg | ORAL_TABLET | Freq: Every day | ORAL | Status: DC
Start: 2014-11-07 — End: 2014-11-10
  Administered 2014-11-07 – 2014-11-10 (×4): 6 mg via ORAL
  Filled 2014-11-06 (×5): qty 1

## 2014-11-06 MED ORDER — SPIRONOLACTONE 100 MG PO TABS
200.0000 mg | ORAL_TABLET | Freq: Every day | ORAL | Status: DC
  Administered 2014-11-06 – 2014-11-11 (×6): 200 mg via ORAL
  Filled 2014-11-06 (×7): qty 2

## 2014-11-06 MED ORDER — PRO-STAT SUGAR FREE PO LIQD
30.0000 mL | Freq: Three times a day (TID) | ORAL | Status: DC
  Administered 2014-11-06 – 2014-11-11 (×15): 30 mL via ORAL
  Filled 2014-11-06 (×18): qty 30

## 2014-11-06 NOTE — Progress Notes (Signed)
At beginning of shift when I first walked into patient room, patient looked at clock and stated he better get ready for day since it was twenty minutes until 8.  I explained to patient it was almost 8pm and was reoriented.  Patient got up to the bathroom and made another comment that he should "brush my teeth so I am ready for therapy since it is already 8am." Again I reoriented patient and he chuckled saying "oh yes, I forgot." No report of confusion from prior RN on days or at night. Will continue to monitor.

## 2014-11-06 NOTE — Plan of Care (Signed)
Problem: RH BOWEL ELIMINATION Goal: RH STG MANAGE BOWEL WITH ASSISTANCE STG Manage Bowel with mod I Assistance.  Outcome: Not Progressing Patient had incontinent episode over night     Problem: RH SKIN INTEGRITY Goal: RH STG SKIN FREE OF INFECTION/BREAKDOWN Patients skin will remain free from further breakdown or infection with min assist.  Outcome: Not Progressing Stage 2 sacrum-  Goal: RH STG MAINTAIN SKIN INTEGRITY WITH ASSISTANCE STG Maintain Skin Integrity With min Assistance.  Outcome: Not Progressing Stage 2 sacrum

## 2014-11-06 NOTE — Progress Notes (Signed)
Physical Therapy Session Note  Patient Details  Name: [name] MRN: [id number] Date of Birth: Jun 14, 1951  Today's Date: 11/06/2014 PT Individual Time: 1300-1400 PT Individual Time Calculation (min): 60 min   Short Term Goals: Week 2:  PT Short Term Goal 1 (Week 2): = LTGs due to anticipated LOS  Skilled Therapeutic Interventions/Progress Updates:   Focus on functional strengthening, NMR, standing balance, safety awareness, and activity tolerance. Gait using RW x 160 ft with supervision/assist for IV pole and increased time. Stair training up/down eight 6" steps using 2 rails with min A overall, verbal cues for safe step-to sequencing and advancing LUE along rail. Seated/standing BLE therex using RW for UE support: seated hip flexion x 40, standing heel raises, sit <> stand from mat x 10. Patient instructed in standing knee flexion but unable to safely perform due to LOB to left. Standing at tall table, participated in game of checkers using BUE with S-min A and verbal cues for upright posture, no cues needed to incorporate LUE. Patient performed NuStep using BUE/BLE at level 4 x 5 min. Patient demonstrated improved active cervical rotation to L this session with report of decreased pain. Patient left on NuStep, handoff to OT.   Therapy Documentation Precautions:  Precautions Precautions: Fall Restrictions Weight Bearing Restrictions: No Pain: Pain Assessment Pain Assessment: No/denies pain  See FIM for current functional status  Therapy/Group: Individual Therapy  Laretta Alstrom 11/06/2014, 2:03 PM

## 2014-11-06 NOTE — Progress Notes (Signed)
Speech Language Pathology Daily Session Note  Patient Details  Name: MERWYN HODAPP MRN: [id number] Date of Birth: 1950/09/26  Today's Date: 11/06/2014 SLP Individual Time: 0900-1000 SLP Individual Time Calculation (min): 60 min  Short Term Goals: Week 2: SLP Short Term Goal 1 (Week 2): Pt will utilize external/internal aids to facilitate recall of new information during functional tasks with supervision verbal and question cues.  SLP Short Term Goal 2 (Week 2): Pt will demonstrate mildly complex problem solving to complete functional tasks with supervision verbal and question cues  Skilled Therapeutic Interventions: Skilled treatment session focused on cognitive goals. SLP facilitated session by providing Min A question cues for organization and self-monitoring and correcting of errors during a mildly complex task of organizing a 4 time per day pill box.  Patient verbalized that he felt his function was impacted by fatigue and reported, "I can usually do this in 10 minutes." Educated patient in regards to his current cognitive impairments and goals of skilled SLP intervention, he verbalized understanding.  Patient left in recliner with all needs within reach. Continue with current plan of care.    FIM:  Comprehension Comprehension Mode: Auditory Comprehension: 5-Follows basic conversation/direction: With extra time/assistive device Expression Expression Mode: Verbal Expression: 5-Expresses basic needs/ideas: With no assist Social Interaction Social Interaction: 5-Interacts appropriately 90% of the time - Needs monitoring or encouragement for participation or interaction. Problem Solving Problem Solving: 4-Solves basic 75 - 89% of the time/requires cueing 10 - 24% of the time Memory Memory: 4-Recognizes or recalls 75 - 89% of the time/requires cueing 10 - 24% of the time  Pain Pain Assessment Pain Assessment: No/denies pain  Therapy/Group: Individual Therapy  Ameila Weldon,  Bruin Bolger 11/06/2014, 3:08 PM

## 2014-11-06 NOTE — Progress Notes (Signed)
Occupational Therapy Session Note  Patient Details  Name: [name] MRN: [id number] Date of Birth: 1951/04/07  Today's Date: 11/06/2014 OT Individual Time: 6767-2094 and 7096-2836 OT Individual Time Calculation (min): 60 min and 30 min     Short Term Goals: Week 2:  OT Short Term Goal 1 (Week 2): Pt will complete LB dressing with min assist OT Short Term Goal 2 (Week 2): Pt will complete toilet task with steadying assist for balance only OT Short Term Goal 3 (Week 2): Pt will complete toilet transfer at supervision level OT Short Term Goal 4 (Week 2): Pt will utilize LUE at gross assist level during self-care task with min cues  Skilled Therapeutic Interventions/Progress Updates:    Session 1: Pt seen for ADL retraining with focus on functional transfers, standing balance, activity tolerance, and use of AE. Pt received supine in bed reporting "throbbing" in LLE with increased edema noted. Notified RN who provided verbal order that pt is able to get OOB, however limit ambulation. Pt completed supine>sit with min A and min cues for sequencing. Completed stand pivot transfer bed>w/c with CGA for balance and improved safety using RW. Pt completed bathing sit<>stand at sink with therapist providing mod hand over hand assist to LUE to wash RUE. Pt required supervision for sit<>stand during bathing and dressing with SBA for dynamic standing balance and pt utilizing LUE for support. Pt utilized reacher with increased time and max cues with 25-50% success. Pt fatigued quickly throughout session, requiring frequent rest breaks. Pt completed stand pivot transfer w/c>recliner at SBA level and left with RN present and all needs in reach.   Session 2: Pt seen for 1:1 OT session with focus on LUE NMR, functional transfers, and activity tolerance. Pt received on NuStep with PT present. Pt completed remaining for 4 min on NuStep to reach 10 min goal with focus on LUE A/AROM and activity tolerance. Ambulated  short distance to mat table at supervision using RW. Engaged in [place] with focus on shoulder flexion/extension and horizontal adduction/abduction. Utilized hollow tubing with pt using BUEs to hold at 90 degrees shoulder flexion. Pt required min A to achieve positioning then pt able to maintain 90 degrees shoulder flexion for 3-5 seconds. Pt reporting too fatigued to ambulate back to room therefore therapist retrieved w/c. Pt completed stand pivot transfer mat>w/c and w/c>bed at supervision. Pt required mod A sit>supine and mod cues for sequencing of task. Pt left supine in bed with RN present.    Therapy Documentation Precautions:  Precautions Precautions: Fall Restrictions Weight Bearing Restrictions: No General:   Vital Signs: Therapy Vitals Temp: 98.4 F (36.9 C) Temp Source: Oral Pulse Rate: 63 Resp: 18 BP: 116/62 mmHg Patient Position (if appropriate): Lying Oxygen Therapy SpO2: 97 % O2 Device: Not Delivered Pain: Pain Assessment Pain Assessment: 0-10 Pain Score: 8  Pain Type: Acute pain Pain Location: Shoulder Pain Orientation: Left Pain Descriptors / Indicators: Aching Pain Frequency: Intermittent Pain Onset: With Activity Patients Stated Pain Goal: 4 Pain Intervention(s): Medication (See eMAR)    See FIM for current functional status  Therapy/Group: Individual Therapy  Duayne Cal 11/06/2014, 7:58 AM

## 2014-11-06 NOTE — Progress Notes (Signed)
[name] PHYSICAL MEDICINE & REHABILITATION     PROGRESS NOTE    Subjective/Complaints: Left leg with more swelling, calf tenderness over last 12 hours or so.    Objective: Vital Signs: Blood pressure 116/62, pulse 63, temperature 98.4 F (36.9 C), temperature source Oral, resp. rate 18, height 5\' 10"  (1.778 m), weight 95.9 kg (211 lb 6.7 oz), SpO2 97 %. Dg Chest 2 View  11/05/2014   CLINICAL DATA:  Productive cough. Wheezing. Lower extremity swelling.  EXAM: CHEST  2 VIEW  COMPARISON:  10/21/2014  FINDINGS: Low lung volumes are again seen with bibasilar atelectasis. No evidence of pulmonary consolidation or pleural effusion. Heart size is normal.  New right arm PICC line is seen with tip overlying expected location of superior cavoatrial junction.  IMPRESSION: Right arm PICC line in appropriate position.  Low lung volumes and bibasilar atelectasis, without significant change.   Electronically Signed   By: [name] M.D.   On: 11/05/2014 11:06   Dg Abd 1 View  11/05/2014   CLINICAL DATA:  Constipation.  Lower extremity swelling.  EXAM: ABDOMEN - 1 VIEW  COMPARISON:  10/28/2014  FINDINGS: Increase in multiple dilated small bowel loops seen since previous study, with suspected bowel wall thickening noted in right abdomen. There is a relative paucity of colonic gas. A partial small bowel obstruction cannot be excluded. Calcified gallstone again noted in the right upper quadrant.  IMPRESSION: Increased small bowel dilatation with suspected bowel wall thickening in the right abdomen. Partial small bowel obstruction cannot be excluded. Consider abdomen pelvis CT with contrast for further evaluation.  Cholelithiasis.   Electronically Signed   By: [name] M.D.   On: 11/05/2014 11:13    Recent Labs  11/04/14 0620  WBC 6.3  HGB 10.9*  HCT 32.5*  PLT 36*    Recent Labs  11/05/14 0634 11/06/14 0425  NA 126* 129*  K 4.4 4.1  CL 94* 96*  GLUCOSE 165* 153*  BUN 12 9  CREATININE 0.59*  0.54*  CALCIUM 8.3* 8.5*   CBG (last 3)   Recent Labs  11/05/14 1637 11/05/14 2125 11/06/14 0650  GLUCAP 164* 148* 158*    Wt Readings from Last 3 Encounters:  11/06/14 95.9 kg (211 lb 6.7 oz)  06/13/14 88.905 kg (196 lb)  02/07/14 89.812 kg (198 lb)    Physical Exam:  Nursing note and vitals reviewed. Constitutional: He is oriented to person, place, and time. He appears well-developed and well-nourished.  HENT:  Head: Normocephalic and atraumatic.  Eyes: Conjunctivae are normal. Pupils are equal, round, and reactive to light.  Neck: Muscular tenderness present. Decreased range of motion present.     Cardiovascular: Normal rate and regular rhythm.  Respiratory: Effort normal and diminished basilar breath sounds . No respiratory distress. bilateral wheezes.  GI: Abdomen softer but sill with ongoing ascites/ distention. Bowel sounds are improved. There is no tenderness.  Musculoskeletal: He exhibits edema (1++ edema pedally). Mild scrotal edema. Left leg with more swelling, tenderness with calf palpation. homan equivcoal Neck pain with ROM bilateral hips and LUE.  Neurological: He is alert and oriented to person, place, and time. Coordination abnormal.  Left inattention due to neck and shoulder pain limiting ROM.Marland Kitchen   speech clear and is able to follow basic commands without difficulty. RUE 4+/5. L- deltoid 2/5, 3- biceps, 3+triceps, intrinsics 4+/5. BLE 3+/5 with ataxia. No obvious sensory loss to LT/P.  DTR's 1+  Skin: Skin is warm and dry.  Psychiatric: His speech is normal.  He's alert   Assessment/Plan: 1. Functional deficits secondary to cervical epidural abscess which require 3+ hours per day of interdisciplinary therapy in a comprehensive inpatient rehab setting. Physiatrist is providing close team supervision and 24 hour management of active medical problems listed below. Physiatrist and rehab team continue to assess barriers to discharge/monitor patient  progress toward functional and medical goals.  FIM: FIM - Bathing Bathing Steps Patient Completed: Chest, Abdomen, Front perineal area, Buttocks, Right upper leg, Left upper leg, Left Arm, Left lower leg (including foot), Right lower leg (including foot) Bathing: 4: Min-Patient completes 8-9 75f 10 parts or 75+ percent  FIM - Upper Body Dressing/Undressing Upper body dressing/undressing steps patient completed: Thread/unthread right sleeve of pullover shirt/dresss, Put head through opening of pull over shirt/dress, Thread/unthread left sleeve of pullover shirt/dress Upper body dressing/undressing: 4: Min-Patient completed 75 plus % of tasks FIM - Lower Body Dressing/Undressing Lower body dressing/undressing steps patient completed: Thread/unthread left pants leg Lower body dressing/undressing: 2: Max-Patient completed 25-49% of tasks  FIM - Toileting Toileting steps completed by patient: Performs perineal hygiene, Adjust clothing prior to toileting Toileting Assistive Devices: Grab bar or rail for support Toileting: 3: Mod-Patient completed 2 of 3 steps  FIM - Radio producer Devices: Insurance account manager Transfers: 4-To toilet/BSC: Min A (steadying Pt. > 75%), 4-From toilet/BSC: Min A (steadying Pt. > 75%)  FIM - Bed/Chair Transfer Bed/Chair Transfer Assistive Devices: Bed rails, Arm rests, Copy: 4: Supine > Sit: Min A (steadying Pt. > 75%/lift 1 leg), 4: Bed > Chair or W/C: Min A (steadying Pt. > 75%)  FIM - Locomotion: Wheelchair Locomotion: Wheelchair: 0: Activity did not occur FIM - Locomotion: Ambulation Locomotion: Ambulation Assistive Devices: Administrator Ambulation/Gait Assistance: 5: Supervision, 4: Min guard Locomotion: Ambulation: 2: Travels 50 - 149 ft with minimal assistance (Pt.>75%)  Comprehension Comprehension Mode: Auditory Comprehension: 5-Follows basic conversation/direction: With extra time/assistive  device  Expression Expression Mode: Verbal Expression: 5-Expresses basic needs/ideas: With no assist  Social Interaction Social Interaction: 5-Interacts appropriately 90% of the time - Needs monitoring or encouragement for participation or interaction.  Problem Solving Problem Solving: 4-Solves basic 75 - 89% of the time/requires cueing 10 - 24% of the time  Memory Memory: 4-Recognizes or recalls 75 - 89% of the time/requires cueing 10 - 24% of the time  Medical Problem List and Plan: 1. 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 DVT Prophylaxis/Anticoagulation: Mechanical:  Antiembolism stockings, knee (TED hose) Bilateral lower extremities Sequential compression devices, below knee Bilateral lower extremities  -recheck doppler left leg 3. Pain Management: will add low dose ms contin and observe for effect---may not be able to tolerate however.  -continue oxycodone 5mg  prn  .  -lidoderm, ice/heat, posture 4. Mood:   provide ego support.  5. Neuropsych: This patient is capable of making decisions on his own behalf. 6. Skin/Wound Care: Routine pressure relief measures. Maintain adequate nutritional support.  7. Fluids/Electrolytes/Nutrition: hyponatremia likely multifactorial, daily bmet for now  8. 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 Ascites on u/s, abdominal distention on exam   -spironolactone/lasix per GI---weight going back up. Will need to be more aggressive with diuretics  -bowel movements yesterday x 2  -weight up to 96kg 9. Thrombocytopenia: Stable at 36 10. DM type 2: Hgb  A1c- 8.7. Monitor BS with ac/hs checks and will use SSI for elevated BS.   - resumed Tradjenta as was on Invokana and Tradjenta at home.  -some improvement  11. Reactive leucocytosis: Slowly resolving with treatment of infection.  12.  Ileus: + results with SSE/program.no result from dulc supp  -d/c'ed PO reglan 13. Urine retention: foley (with diuretic on board)  -ucx negative 14.  Wheezing, no CP or SOB, check CXR, some greensish sputum , afeb   LOS (Days) 12 A FACE TO FACE EVALUATION WAS PERFORMED  Burch,Jonathan T 11/06/2014 8:05 AM

## 2014-11-06 NOTE — Progress Notes (Signed)
Daily Rounding Note  11/06/2014, 9:49 AM  LOS: 12 days   SUBJECTIVE:       2 small, one larger BM yesterday.  No nausea.  Harder for him to accommodate meals due to belly swelling.  Legs also swelling and some pain in left foot.    PICC line placed 5/1.    Foley cath still in place.   OBJECTIVE:         Vital signs in last 24 hours:    Temp:  [98.4 F (36.9 C)-98.9 F (37.2 C)] 98.4 F (36.9 C) (05/02 0546) Pulse Rate:  [59-63] 63 (05/02 0546) Resp:  [18] 18 (05/02 0546) BP: (116-119)/(62-76) 116/62 mmHg (05/02 0546) SpO2:  [97 %-98 %] 97 % (05/02 0546) Weight:  [211 lb 6.7 oz (95.9 kg)] 211 lb 6.7 oz (95.9 kg) (05/02 0500) Last BM Date: 11/06/14 Filed Weights   11/04/14 0502 11/05/14 0543 11/06/14 0500  Weight: 205 lb 4.8 oz (93.123 kg) 205 lb 11 oz (93.3 kg) 211 lb 6.7 oz (95.9 kg)   General: pale, chronically ill looking.  NAD, comfortable   Heart: RRR Chest: clear bil.  No dyspnea or cough Abdomen: more distended and tighter.   Extremities: 3+ pedal edema.  Neuro/Psych:  Pleasant, affect bland, not agitated or anxious.  No tremor or asterixis.   Lab Results:  Recent Labs  11/04/14 0620  WBC 6.3  HGB 10.9*  HCT 32.5*  PLT 36*   BMET  Recent Labs  11/04/14 0620 11/05/14 0634 11/06/14 0425  NA 130* 126* 129*  K 4.3 4.4 4.1  CL 96 94* 96*  CO2 28 27 28   GLUCOSE 211* 165* 153*  BUN 12 12 9   CREATININE 0.56 0.59* 0.54*  CALCIUM 8.4 8.3* 8.5*    Studies/Results: Dg Chest 2 View  11/05/2014   CLINICAL DATA:  Productive cough. Wheezing. Lower extremity swelling.  EXAM: CHEST  2 VIEW  COMPARISON:  10/21/2014  FINDINGS: Low lung volumes are again seen with bibasilar atelectasis. No evidence of pulmonary consolidation or pleural effusion. Heart size is normal.  New right arm PICC line is seen with tip overlying expected location of superior cavoatrial junction.  IMPRESSION: Right arm PICC line in  appropriate position.  Low lung volumes and bibasilar atelectasis, without significant change.   Electronically Signed   By: Earle Gell M.D.   On: 11/05/2014 11:06   Dg Abd 1 View  11/05/2014   CLINICAL DATA:  Constipation.  Lower extremity swelling.  EXAM: ABDOMEN - 1 VIEW  COMPARISON:  10/28/2014  FINDINGS: Increase in multiple dilated small bowel loops seen since previous study, with suspected bowel wall thickening noted in right abdomen. There is a relative paucity of colonic gas. A partial small bowel obstruction cannot be excluded. Calcified gallstone again noted in the right upper quadrant.  IMPRESSION: Increased small bowel dilatation with suspected bowel wall thickening in the right abdomen. Partial small bowel obstruction cannot be excluded. Consider abdomen pelvis CT with contrast for further evaluation.  Cholelithiasis.   Electronically Signed   By: Earle Gell M.D.   On: 11/05/2014 11:13   Scheduled Meds: . bisacodyl  10 mg Rectal QPC supper  . cefTRIAXone (ROCEPHIN)  IV  2 g Intravenous Q12H  . feeding supplement (PRO-STAT SUGAR FREE 64)  30 mL Oral BID  . furosemide  20 mg Oral Daily  . glimepiride  4 mg Oral Q breakfast  . insulin aspart  0-15 Units  Subcutaneous TID WC  . insulin aspart  0-5 Units Subcutaneous QHS  . lactulose  20 g Oral BID  . lidocaine  2 patch Transdermal Q24H  . linagliptin  5 mg Oral Daily  . morphine  15 mg Oral Q12H  . pantoprazole  40 mg Oral q morning - 10a  . propranolol  20 mg Oral q morning - 10a  . senna-docusate  2 tablet Oral BID  . simethicone  160 mg Oral QID  . spironolactone  100 mg Oral Daily  . Vitamin D (Ergocalciferol)  50,000 Units Oral Q Mon   Continuous Infusions:  PRN Meds:.sodium chloride, acetaminophen, alum & mag hydroxide-simeth, benzonatate, diphenhydrAMINE, guaiFENesin-dextromethorphan, methocarbamol, naLOXone (NARCAN)  injection, ondansetron **OR** ondansetron (ZOFRAN) IV, oxyCODONE, sodium chloride, sodium phosphate,  traZODone   ASSESMENT:   * Long-standing cirrhosis due to [place].  Followed at [facility]. Up-to-date with MRI screening for [place]. AFP level 10/13/2014 8.8. HepA/B vaccination series in 2015.  4/16 PT/INR 17/1.3 so doing well from synthetic parameter.   * Ascites. Paracentesis x 3 liters 4/26: no evidence SBP. Due to undefined fluid albumin level unable to calculate SAAG. Low dose aldactone/lasix (50/20) initiated 4/25, Aldactone increased to 100 mg 4/28.  Weights rising since 4/29.  Creatinine actually declining in last 48 hours.  BP tolerating diuretics.   * Hyponatremia. Fluctuating Na levels.   *  Thrombocytopenia.   * Long-standing history of esophageal varices, portal hypertension and portal colopathy. Up to date with screening EGD as of 06/2014 (no banding required). On chronic propranolol.  * Cervical spine abscess, group B strep. Left sided weakness. On long course Rocephin. Taking oxycodone for neck and left shoulder pain.   *  Anemia. Hgb dropped 1.5 grams in 9 days  *  Urinary retention, foley in place since last week.    * Constipation, due to narcotics. Added Lactulose bid on 4/26 for this and for Ammonia level of 56 on 4/21 and ? If his sleepiness which might represent early HE.     * Type 2 diabetes. On multiple oral agents prior to admission     PLAN   *  Will up Lasix to 40 mg daily.  Note that rehab already gave additional 40 mg along with the 20 mg dose of Lasix today.  BMET in AM. May need another paracentesis.   Continue daily weight.   CBC in AM.    [name]  11/06/2014, 9:49 AM Pager: [phone]   ________________________________________________________________________  Velora Heckler GI MD note:  I personally examined the patient, reviewed the data and agree with the assessment and plan described above. Diuretics are not working at their current doses. Will increase lasix to 80mg  once daily and aldactone to 200mg  once daily and observe  his response.  Prefer to hold on paracentesis for now.   Owens Loffler, MD [facility] Pager [phone]

## 2014-11-07 ENCOUNTER — Inpatient Hospital Stay (HOSPITAL_COMMUNITY): Payer: Managed Care, Other (non HMO) | Admitting: Physical Therapy

## 2014-11-07 ENCOUNTER — Inpatient Hospital Stay (HOSPITAL_COMMUNITY): Payer: Managed Care, Other (non HMO) | Admitting: Speech Pathology

## 2014-11-07 ENCOUNTER — Inpatient Hospital Stay (HOSPITAL_COMMUNITY): Payer: Managed Care, Other (non HMO)

## 2014-11-07 ENCOUNTER — Encounter (HOSPITAL_COMMUNITY): Payer: Self-pay | Admitting: *Deleted

## 2014-11-07 LAB — BASIC METABOLIC PANEL
ANION GAP: 8 (ref 5–15)
BUN: 9 mg/dL (ref 6–20)
CO2: 27 mmol/L (ref 22–32)
CREATININE: 0.58 mg/dL — AB (ref 0.61–1.24)
Calcium: 8.7 mg/dL — ABNORMAL LOW (ref 8.9–10.3)
Chloride: 94 mmol/L — ABNORMAL LOW (ref 101–111)
GFR calc Af Amer: >60 mL/min (ref >60)
GFR calc non Af Amer: >60 mL/min (ref >60)
GLUCOSE: 152 mg/dL — AB (ref 70–99)
Potassium: 3.9 mmol/L (ref 3.5–5.1)
Sodium: 129 mmol/L — ABNORMAL LOW (ref 135–145)

## 2014-11-07 LAB — CBC
HEMATOCRIT: 34.5 % — AB (ref 39.0–52.0)
HEMOGLOBIN: 11.9 g/dL — AB (ref 13.0–17.0)
MCH: 31.2 pg (ref 26.0–34.0)
MCHC: 34.5 g/dL (ref 30.0–36.0)
MCV: 90.6 fL (ref 78.0–100.0)
Platelets: 72 10*3/uL — ABNORMAL LOW (ref 150–400)
RBC: 3.81 MIL/uL — AB (ref 4.22–5.81)
RDW: 18.4 % — ABNORMAL HIGH (ref 11.5–15.5)
WBC: 7.6 10*3/uL (ref 4.0–10.5)

## 2014-11-07 LAB — PREALBUMIN: PREALBUMIN: 4.6 mg/dL — AB (ref 18–38)

## 2014-11-07 LAB — GLUCOSE, CAPILLARY
Glucose-Capillary: 112 mg/dL — ABNORMAL HIGH (ref 70–99)
Glucose-Capillary: 195 mg/dL — ABNORMAL HIGH (ref 70–99)
Glucose-Capillary: 152 mg/dL — ABNORMAL HIGH (ref 70–99)
Glucose-Capillary: 283 mg/dL — ABNORMAL HIGH (ref 70–99)

## 2014-11-07 MED ORDER — ALTEPLASE 2 MG IJ SOLR
2.0000 mg | Freq: Once | INTRAMUSCULAR | Status: AC
  Administered 2014-11-07: 2 mg
  Filled 2014-11-07: qty 2

## 2014-11-07 NOTE — Progress Notes (Signed)
Physical Therapy Session Note  Patient Details  Name: [name] MRN: [id number] Date of Birth: 1951-07-02  Today's Date: 11/07/2014 PT Individual Time: 0900-1000 PT Individual Time Calculation (min): 60 min   Short Term Goals: Week 2:  PT Short Term Goal 1 (Week 2): = LTGs due to anticipated LOS  Skilled Therapeutic Interventions/Progress Updates:   Session focused on functional transfers, standing tolerance, endurance, and wheelchair seating and positioning. Upon standing from recliner with increased effort and supervision, patient noted to be incontinent of loose stool. Patient ambulated to bathroom to finish voiding using RW and performed multiple sit <> stands from toilet and standing balance with RW at supervision level, able to manage clothing prior to toileting but total A for hygiene and managing clothing after toileting. Patient education provided on pressure relief due to sacral pressure ulcer and seating options in room. Patient c/o discomfort in recliner and bed. Fitted wheelchair with elevating leg rests to manage BLE edema with pressure relieving w/c cushion. Patient reported improved tolerance to sitting in wheelchair and left with call bell within reach and RN present.    Therapy Documentation Precautions:  Precautions Precautions: Fall Restrictions Weight Bearing Restrictions: No Pain: Pain Assessment Pain Assessment: 0-10 Pain Score: 3  Pain Type: Acute pain Pain Location: Neck Pain Orientation: Mid;Left Pain Descriptors / Indicators: Aching Pain Onset: On-going Patients Stated Pain Goal: 3 Pain Intervention(s): Medication (See eMAR)  See FIM for current functional status  Therapy/Group: Individual Therapy  Laretta Alstrom 11/07/2014, 10:20 AM

## 2014-11-07 NOTE — Progress Notes (Signed)
Speech Language Pathology Daily Session Note  Patient Details  Name: [name] MRN: [id number] Date of Birth: Oct 03, 1950  Today's Date: 11/07/2014 SLP Individual Time: 1000-1100 SLP Individual Time Calculation (min): 60 min  Short Term Goals: Week 2: SLP Short Term Goal 1 (Week 2): Pt will utilize external/internal aids to facilitate recall of new information during functional tasks with supervision verbal and question cues.  SLP Short Term Goal 2 (Week 2): Pt will demonstrate mildly complex problem solving to complete functional tasks with supervision verbal and question cues  Skilled Therapeutic Interventions: Skilled treatment session focused on cognitive goals. SLP facilitated session by providing supervision question cues for recall during a mildly complex memory task with focus on use of memory compensatory strategies. Patient recalled rules throughout task with Mod I but demonstrated extra time to complete due to delayed processing. Patient also recalled events from morning therapy sessions with Mod I. Patient left in wheelchair with all needs within reach. Continue with current plan of care.    FIM:  Comprehension Comprehension Mode: Auditory Comprehension: 5-Follows basic conversation/direction: With extra time/assistive device Expression Expression Mode: Verbal Expression: 5-Expresses basic needs/ideas: With no assist Social Interaction Social Interaction: 5-Interacts appropriately 90% of the time - Needs monitoring or encouragement for participation or interaction. Problem Solving Problem Solving: 4-Solves basic 75 - 89% of the time/requires cueing 10 - 24% of the time Memory Memory: 5-Recognizes or recalls 90% of the time/requires cueing < 10% of the time FIM - Eating Eating Activity: 5: Set-up assist for open containers  Pain No/Denies Pain   Therapy/Group: Individual Therapy  Jamariya Davidoff 11/07/2014, 2:38 PM

## 2014-11-07 NOTE — Progress Notes (Signed)
Occupational Therapy Weekly Progress Note  Patient Details  Name: [name] MRN: [id number] Date of Birth: [birth year]  Beginning of progress report period: November 01, 2014 End of progress report period: Nov 07, 2014  Today's Date: 11/07/2014  Patient has met 2 of 10 long term goals.  Short term goals not set due to estimated length of stay.  Patient has made good progress during this reporting period. Patient completes stand pivot transfers with RW at supervision level. Patient requires min assist overall for self-care tasks with occasional mod A for LB dressing. Patient demonstrates good carryover with use of AE, requiring only min instructional cues. Patient demonstrates small gains in functional use of LUE, utilizing at a diminished level with min A. Patient and wife has been educated on AAROM exercises, which patient consistently reports completing outside of therapy. Patient's greatest barriers at this time are edema, pain, decreased activity tolerance, and decreased functional use of LUE.  Patient continues to demonstrate the following deficits: decreased balance strategies, decreased functional use of LUE, decreased standing balance, decreased activity tolerance, decreased strength, decreased coordination, decreased problem solving, decreased awareness, decreased ability to compensate for deficits, edema and therefore will continue to benefit from skilled OT intervention to enhance overall performance with BADLs, standing balnace, functional use of LUE, coordination, and activity tolerance.  See Patient's Care Plan for progression toward long term goals.  Patient progressing toward long term goals..  Continue plan of care.  Skilled Therapeutic Interventions/Progress Updates:      Therapy Documentation Precautions:  Precautions Precautions: Fall Restrictions Weight Bearing Restrictions: No General:   Vital Signs: Therapy Vitals Temp: 98.7 F (37.1 C) Temp Source: Oral Pulse  Rate: 78 Resp: 18 BP: (!) 105/57 mmHg Patient Position (if appropriate): Lying Oxygen Therapy SpO2: 96 % O2 Device: Not Delivered Pain:   ADL:   Exercises:   Other Treatments:    See FIM for current functional status  Therapy/Group: Individual Therapy  Duayne Cal 11/07/2014, 4:07 PM

## 2014-11-07 NOTE — Progress Notes (Signed)
[place] PHYSICAL MEDICINE & REHABILITATION     PROGRESS NOTE    Subjective/Complaints: Left ankle still a littler sore. Swelling remains an issues also. Able to sleep last night    Objective: Vital Signs: Blood pressure 120/59, pulse 68, temperature 98.7 F (37.1 C), temperature source Oral, resp. rate 18, height 5\' 10"  (1.778 m), weight 91.2 kg (201 lb 1 oz), SpO2 96 %. Dg Chest 2 View  11/05/2014   CLINICAL DATA:  Productive cough. Wheezing. Lower extremity swelling.  EXAM: CHEST  2 VIEW  COMPARISON:  10/21/2014  FINDINGS: Low lung volumes are again seen with bibasilar atelectasis. No evidence of pulmonary consolidation or pleural effusion. Heart size is normal.  New right arm PICC line is seen with tip overlying expected location of superior cavoatrial junction.  IMPRESSION: Right arm PICC line in appropriate position.  Low lung volumes and bibasilar atelectasis, without significant change.   Electronically Signed   By: Earle Gell M.D.   On: 11/05/2014 11:06   Dg Abd 1 View  11/05/2014   CLINICAL DATA:  Constipation.  Lower extremity swelling.  EXAM: ABDOMEN - 1 VIEW  COMPARISON:  10/28/2014  FINDINGS: Increase in multiple dilated small bowel loops seen since previous study, with suspected bowel wall thickening noted in right abdomen. There is a relative paucity of colonic gas. A partial small bowel obstruction cannot be excluded. Calcified gallstone again noted in the right upper quadrant.  IMPRESSION: Increased small bowel dilatation with suspected bowel wall thickening in the right abdomen. Partial small bowel obstruction cannot be excluded. Consider abdomen pelvis CT with contrast for further evaluation.  Cholelithiasis.   Electronically Signed   By: Earle Gell M.D.   On: 11/05/2014 11:13   No results for input(s): WBC, HGB, HCT, PLT in the last 72 hours.  Recent Labs  11/05/14 0634 11/06/14 0425  NA 126* 129*  K 4.4 4.1  CL 94* 96*  GLUCOSE 165* 153*  BUN 12 9  CREATININE  0.59* 0.54*  CALCIUM 8.3* 8.5*   CBG (last 3)   Recent Labs  11/06/14 1620 11/06/14 2133 11/07/14 0621  GLUCAP 145* 133* 112*    Wt Readings from Last 3 Encounters:  11/07/14 91.2 kg (201 lb 1 oz)  06/13/14 88.905 kg (196 lb)  02/07/14 89.812 kg (198 lb)    Physical Exam:  Nursing note and vitals reviewed. Constitutional: He is oriented to person, place, and time. He appears well-developed and well-nourished.  HENT:  Head: Normocephalic and atraumatic.  Eyes: Conjunctivae are normal. Pupils are equal, round, and reactive to light.  Neck: Muscular tenderness present. Decreased range of motion present.     Cardiovascular: Normal rate and regular rhythm.  Respiratory: Effort normal and diminished basilar breath sounds . No respiratory distress. bilateral wheezes.  GI: Abdomen softer but sill with ongoing ascites/ distention. Bowel sounds are improved. There is no tenderness.  Musculoskeletal: He exhibits edema (1++ edema pedally). Mild scrotal edema. Left foot sore along 5th MT Neck pain with ROM bilateral hips and LUE.  Neurological: He is alert and oriented to person, place, and time. Coordination abnormal.  Left inattention due to neck and shoulder pain limiting ROM.Marland Kitchen   speech clear and is able to follow basic commands without difficulty. RUE 4+/5. L- deltoid 2/5, 3- biceps, 3+triceps, intrinsics 4+/5. BLE 3+/5 with ataxia. No obvious sensory loss to LT/P.  DTR's 1+  Skin: Skin is warm and dry.    Psychiatric: His speech is normal.  He's alert  Assessment/Plan: 1. Functional deficits secondary to cervical epidural abscess which require 3+ hours per day of interdisciplinary therapy in a comprehensive inpatient rehab setting. Physiatrist is providing close team supervision and 24 hour management of active medical problems listed below. Physiatrist and rehab team continue to assess barriers to discharge/monitor patient progress toward functional and medical  goals.  FIM: FIM - Bathing Bathing Steps Patient Completed: Chest, Abdomen, Front perineal area, Buttocks, Right upper leg, Left upper leg, Left Arm, Left lower leg (including foot), Right lower leg (including foot) Bathing: 4: Min-Patient completes 8-9 55f 10 parts or 75+ percent  FIM - Upper Body Dressing/Undressing Upper body dressing/undressing steps patient completed: Thread/unthread right sleeve of pullover shirt/dresss, Put head through opening of pull over shirt/dress, Thread/unthread left sleeve of pullover shirt/dress Upper body dressing/undressing: 4: Min-Patient completed 75 plus % of tasks FIM - Lower Body Dressing/Undressing Lower body dressing/undressing steps patient completed: Thread/unthread left pants leg Lower body dressing/undressing: 2: Max-Patient completed 25-49% of tasks  FIM - Toileting Toileting steps completed by patient: Performs perineal hygiene, Adjust clothing prior to toileting Toileting Assistive Devices: Grab bar or rail for support Toileting: 3: Mod-Patient completed 2 of 3 steps  FIM - Radio producer Devices: Insurance account manager Transfers: 4-To toilet/BSC: Min A (steadying Pt. > 75%), 4-From toilet/BSC: Min A (steadying Pt. > 75%)  FIM - Bed/Chair Transfer Bed/Chair Transfer Assistive Devices: Bed rails, Arm rests, Copy: 5: Bed > Chair or W/C: Supervision (verbal cues/safety issues)  FIM - Locomotion: Wheelchair Locomotion: Wheelchair: 0: Activity did not occur FIM - Locomotion: Ambulation Locomotion: Ambulation Assistive Devices: Administrator Ambulation/Gait Assistance: 5: Supervision Locomotion: Ambulation: 5: Travels 150 ft or more with supervision/safety issues  Comprehension Comprehension Mode: Auditory Comprehension: 5-Follows basic conversation/direction: With extra time/assistive device  Expression Expression Mode: Verbal Expression: 5-Expresses basic needs/ideas: With no  assist  Social Interaction Social Interaction: 5-Interacts appropriately 90% of the time - Needs monitoring or encouragement for participation or interaction.  Problem Solving Problem Solving: 4-Solves basic 75 - 89% of the time/requires cueing 10 - 24% of the time  Memory Memory: 4-Recognizes or recalls 75 - 89% of the time/requires cueing 10 - 24% of the time  Medical Problem List and Plan: 1. 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 DVT Prophylaxis/Anticoagulation: Mechanical:  Antiembolism stockings, knee (TED hose) Bilateral lower extremities Sequential compression devices, below knee Bilateral lower extremities  -recheck doppler left leg (still pending!) 3. Pain Management: will add low dose ms contin and observe for effect---may not be able to tolerate however.  -continue oxycodone 5mg  prn  .  -lidoderm, ice/heat, posture 4. Mood:   provide ego support.  5. Neuropsych: This patient is capable of making decisions on his own behalf. 6. Skin/Wound Care: Routine pressure relief measures. Maintain adequate nutritional support.  7. Fluids/Electrolytes/Nutrition: hyponatremia likely multifactorial, daily bmet for now  8. 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 Ascites on u/s, abdominal distention on exam   -spironolactone/lasix per GI---weight going back up. Lasix 60mg  yesterday.  -daily lasix increased to 40mg   -mgt per GI  -weight 91kg 9. Thrombocytopenia: Stable at 36 10. DM type 2: Hgb A1c- 8.7. Monitor BS with ac/hs checks and will use SSI for elevated BS.   - resumed Tradjenta as was on Invokana and Tradjenta at home.  -some  improvement  11. Reactive leucocytosis: Slowly resolving with treatment of infection.  12. Ileus: resolved. Stools now loose from lactulose  -d/c'ed PO reglan 13. Urine retention: foley (with  diuretic on board)  -ucx negative     LOS (Days) 13 A FACE TO FACE EVALUATION WAS PERFORMED  Gustavia Carie T 11/07/2014 8:11 AM

## 2014-11-07 NOTE — Progress Notes (Signed)
Speech Language Pathology Daily Session Note  Patient Details  Name: [name] MRN: [id number] Date of Birth: July 25, 1950  Today's Date: 11/07/2014 SLP Individual Time: 4010-2725 SLP Individual Time Calculation (min): 30 min  Short Term Goals: Week 2: SLP Short Term Goal 1 (Week 2): Pt will utilize external/internal aids to facilitate recall of new information during functional tasks with supervision verbal and question cues.  SLP Short Term Goal 1 - Progress (Week 2): Progressing toward goal SLP Short Term Goal 2 (Week 2): Pt will demonstrate mildly complex problem solving to complete functional tasks with supervision verbal and question cues SLP Short Term Goal 2 - Progress (Week 2): Progressing toward goal  Skilled Therapeutic Interventions: Skilled ST intervention provided with tx focus on cognitive goals. Upon SLP arrival, pt was restng in bed, awake and agreeable to participate in therapy.  SLP facilitated session by providing supervision verbal cues during complex multiprocess reasoning task, with focus on problem solving and reasoning. Pt demonstrates improvement in alertness and participation since last seen by this SLP.   FIM:  Comprehension Comprehension Mode: Auditory Comprehension: 5-Follows basic conversation/direction: With extra time/assistive device Expression Expression Mode: Verbal Expression: 5-Expresses basic needs/ideas: With no assist Social Interaction Social Interaction: 5-Interacts appropriately 90% of the time - Needs monitoring or encouragement for participation or interaction. Problem Solving Problem Solving: 4-Solves basic 75 - 89% of the time/requires cueing 10 - 24% of the time Memory Memory: 5-Recognizes or recalls 90% of the time/requires cueing < 10% of the time  Pain Pain Assessment Pain Assessment: No/denies pain  Therapy/Group: Individual Therapy   Sadao Weyer B. [place], [facility], [place]  Shonna Chock 11/07/2014, 5:01 PM

## 2014-11-07 NOTE — Plan of Care (Signed)
Problem: RH Dressing Goal: LTG Patient will perform lower body dressing w/assist (OT) LTG: Patient will perform lower body dressing with assist, with/without cues in positioning using equipment (OT)  Downgraded to min A due to slow progress and difficulty with AE due to edema

## 2014-11-07 NOTE — Progress Notes (Signed)
Daily Rounding Note  11/07/2014, 11:15 AM  LOS: 13 days   SUBJECTIVE:       Feet still feel tight.  Belly still swollen.  Foley still in place, output 1.5 liters yesterday, 1.8 liters on 5/1.  No abd pain. 3 stools yesterday, 2 small.  On BM today.   OBJECTIVE:         Vital signs in last 24 hours:    Temp:  [98.3 F (36.8 C)-98.7 F (37.1 C)] 98.7 F (37.1 C) (05/03 0534) Pulse Rate:  [54-68] 68 (05/03 0534) Resp:  [16-18] 18 (05/03 0534) BP: (120-142)/(59-68) 120/59 mmHg (05/03 0534) SpO2:  [96 %-97 %] 96 % (05/03 0534) Weight:  [201 lb 1 oz (91.2 kg)] 201 lb 1 oz (91.2 kg) (05/03 0700) Last BM Date: 11/07/14 Filed Weights   11/05/14 0543 11/06/14 0500 11/07/14 0700  Weight: 205 lb 11 oz (93.3 kg) 211 lb 6.7 oz (95.9 kg) 201 lb 1 oz (91.2 kg)   General: looks weak and chronically unwell.  Comfortable, NAD   Heart: RRR Chest: clear bil.  No cough, slight dyspnea with speech.  Abdomen: softer but still protuberant/distended.  NT.  BS present.   Extremities: 2+ edema.   Neuro/Psych:  Pleasant, cooperative.  Oriented x 3.  No asterixis.    Intake/Output from previous day: 05/02 0701 - 05/03 0700 In: 840 [P.O.:840] Out: 3325 [Urine:3325]  Intake/Output this shift: Total I/O In: 240 [P.O.:240] Out: -   Lab Results:  Recent Labs  11/07/14 0900  WBC 7.6  HGB 11.9*  HCT 34.5*  PLT 72*   BMET  Recent Labs  11/05/14 0634 11/06/14 0425 11/07/14 0900  NA 126* 129* 129*  K 4.4 4.1 3.9  CL 94* 96* 94*  CO2 27 28 27   GLUCOSE 165* 153* 152*  BUN 12 9 9   CREATININE 0.59* 0.54* 0.58*  CALCIUM 8.3* 8.5* 8.7*   Scheduled Meds: . bisacodyl  10 mg Rectal QPC supper  . cefTRIAXone (ROCEPHIN)  IV  2 g Intravenous Q12H  . feeding supplement (PRO-STAT SUGAR FREE 64)  30 mL Oral TID  . furosemide  80 mg Oral Daily  . glimepiride  6 mg Oral Q breakfast  . insulin aspart  0-15 Units Subcutaneous TID WC  .  insulin aspart  0-5 Units Subcutaneous QHS  . lactulose  20 g Oral BID  . lidocaine  2 patch Transdermal Q24H  . linagliptin  5 mg Oral Daily  . morphine  15 mg Oral Q12H  . pantoprazole  40 mg Oral q morning - 10a  . propranolol  20 mg Oral q morning - 10a  . senna-docusate  2 tablet Oral BID  . simethicone  160 mg Oral QID  . spironolactone  200 mg Oral Daily  . Vitamin D (Ergocalciferol)  50,000 Units Oral Q Mon   Continuous Infusions:  PRN Meds:.sodium chloride, acetaminophen, alum & mag hydroxide-simeth, benzonatate, diphenhydrAMINE, guaiFENesin-dextromethorphan, methocarbamol, naLOXone (NARCAN)  injection, ondansetron **OR** ondansetron (ZOFRAN) IV, oxyCODONE, sodium chloride, sodium phosphate, traZODone    ASSESMENT:   * Long-standing cirrhosis due to [place]. Followed at [facility]. Up-to-date with MRI screening for [place]. AFP level 10/13/2014 8.8. HepA/B vaccination series in 2015.  4/16 PT/INR 17/1.3 so doing well from synthetic parameter.   * Ascites. Paracentesis x 3 liters 4/26: no evidence SBP. Due to undefined fluid albumin level unable to calculate SAAG. Low dose aldactone/lasix (50/20) initiated 4/25.  Increased to 200/80 mg  5/4.  Weights imply 10 pound weight loss in the last 24 hours.  So far no renal dysfunction and BP tolerating diuretics.   * Hyponatremia. Fluctuating Na levels.   * Thrombocytopenia.   * Long-standing history of esophageal varices, portal hypertension and portal colopathy. Up to date with screening EGD as of 06/2014 (no banding required). On chronic propranolol.  * Cervical spine abscess, group B strep. Left sided weakness. On long course Rocephin. Taking oxycodone for neck and left shoulder pain.   * Anemia. Hgb up 1.5 grams in 24 hours.   * Urinary retention, foley in place since last week.   * Constipation, resolved. Added Lactulose bid on 4/26 for this and for Ammonia level of 56 on 4/21 and ? If his sleepiness which might  represent early HE.    * Type 2 diabetes. On multiple oral agents prior to admission     PLAN   *  Continue current doses of Aldactone/Lasix at 200/80 mg daily. *  Daily BMET and weights.  Will check pt later this week.  ~ 5/5.     Azucena Freed  11/07/2014, 11:15 AM Pager: [phone]   ________________________________________________________________________  Velora Heckler GI MD note:  I personally examined the patient, reviewed the data and agree with the assessment and plan described above.   Owens Loffler, MD [facility] Gastroenterology Pager [phone]

## 2014-11-07 NOTE — Plan of Care (Signed)
Problem: RH BOWEL ELIMINATION Goal: RH STG MANAGE BOWEL WITH ASSISTANCE STG Manage Bowel with mod I Assistance.  Outcome: Not Progressing Had incontinent episode today

## 2014-11-07 NOTE — Progress Notes (Signed)
Occupational Therapy Session Note  Patient Details  Name: [name] MRN: [id number] Date of Birth: 06-25-1951  Today's Date: 11/07/2014 OT Individual Time: 1101-1202 OT Individual Time Calculation (min): 61 min    Short Term Goals: Week 2:  OT Short Term Goal 1 (Week 2): Pt will complete LB dressing with min assist OT Short Term Goal 2 (Week 2): Pt will complete toilet task with steadying assist for balance only OT Short Term Goal 3 (Week 2): Pt will complete toilet transfer at supervision level OT Short Term Goal 4 (Week 2): Pt will utilize LUE at gross assist level during self-care task with min cues  Skilled Therapeutic Interventions/Progress Updates:    Pt seen for 1:1 OT session with focus on standing balance, use of AE, functional use of LUE, activity tolerance, and problem solving. Pt received sitting in w/c. Completed bathing sit<>stand at sink secondary to pt connected to IV. Pt completed oral care in standing at supervision level. Completed shaving from w/c at supervision level with min cues for turning water off secondary to overflowing basin. Pt with improved initiation and functional use of LUE during bathing, requiring hand over hand assist for thoroughness only to wash RUE. Completed dressing with use of sock-aid and reacher for LB dressing with min A and more than reasonable amount of time. Pt with great difficulty using sock-aid secondary to swelling in feet. Pt required supervision for sit<>stand and dynamic standing balance during self-care tasks. At end of session pt left sitting in w/c with RN present.   Therapy Documentation Precautions:  Precautions Precautions: Fall Restrictions Weight Bearing Restrictions: No General:   Vital Signs:  Pain: Pain Assessment Pain Assessment: 0-10 Pain Score: 3  Pain Type: Acute pain Pain Location: Neck Pain Orientation: Mid;Left Pain Descriptors / Indicators: Aching Pain Onset: On-going Patients Stated Pain Goal: 3 Pain  Intervention(s): Medication (See eMAR)  See FIM for current functional status  Therapy/Group: Individual Therapy  Duayne Cal 11/07/2014, 12:08 PM

## 2014-11-07 NOTE — Consult Note (Signed)
  INITIAL DIAGNOSTIC EVALUATION - CONFIDENTIAL [facility]   MEDICAL NECESSITY:  [name] was seen on the [facility] [facility] for an initial diagnostic evaluation owing to the patient's diagnosis of encephalopathy.   According to medical records, [name] was admitted to the rehab unit owing to "Functional deficits secondary to cervical epidural abscess with myelopathy and left C5 radiculopathy." Records also indicate that he is a "64 year old male with history of hepatic cirrhosis (with portal HTN/ascites/variceal bleed/thrombocytopenia) due to NASH, DM type 2 with neuropathy; who was has had neck pain with malaise for the past 2 weeks. He had blood cultures drawn by MD which was positive for gram-positive cocci.  He was instructed to go to [facility] ED on 10/21/14 due to concerns of spine infection and was transferred to [facility] for evaluation.CT head negative for acute changes. Patient somnolent and encephalopathic with left sided neglect and quadriparesis at admission."   During today's visit, [name] reported suffering from improving cognitive difficulties such as memory loss and word finding problems. He recalls hallucinating during the time when he was really ill but this has resolved. He is left with lingering left-sided motor issues.   From an emotional standpoint, [name] described his current mood as "not bad." He has no history of mental health treatment or issues. No adjustment issues endorsed. Suicidal/homicidal ideation, plan or intent was denied. No manic or hypomanic episodes were reported. The patient denied ever experiencing any auditory/visual hallucinations. No major behavioral or personality changes were endorsed.   [name] feels that he is making strides in therapy. No barriers to therapy identified. He described the rehab staff as "terrific." He has his wife and family to support him throughout this endeavor.   PROCEDURES  ADMINISTERED: [1 unit 90791] Diagnostic clinical interview  Review of available records   SUMMARY & IMPRESSION: [name] reported experiencing mild cognitive difficulties that he feels are improving as his medical status stabilizes. There are no major indications of clinical psychopathology and no barriers to therapy identified. At this time, no formal follow-up with neuropsychology appears required unless requested by the patient or staff. Obtaining mental status exam at some point prior to discharge could be valuable to better assess for cognitive deficits since the patient endorsed mild issues.    DIAGNOSIS:  Encephalopathy   [name], Psy.D.  Clinical Neuropsychologist

## 2014-11-08 ENCOUNTER — Inpatient Hospital Stay (HOSPITAL_COMMUNITY): Payer: Managed Care, Other (non HMO) | Admitting: Speech Pathology

## 2014-11-08 ENCOUNTER — Inpatient Hospital Stay (HOSPITAL_COMMUNITY): Payer: Managed Care, Other (non HMO)

## 2014-11-08 ENCOUNTER — Inpatient Hospital Stay (HOSPITAL_COMMUNITY): Payer: Managed Care, Other (non HMO) | Admitting: Physical Therapy

## 2014-11-08 LAB — BASIC METABOLIC PANEL
Anion gap: 9 (ref 5–15)
BUN: 8 mg/dL (ref 6–20)
CALCIUM: 8.8 mg/dL — AB (ref 8.9–10.3)
CO2: 26 mmol/L (ref 22–32)
CREATININE: 0.61 mg/dL (ref 0.61–1.24)
Chloride: 93 mmol/L — ABNORMAL LOW (ref 101–111)
GFR calc Af Amer: >60 mL/min (ref >60)
GFR calc non Af Amer: >60 mL/min (ref >60)
GLUCOSE: 137 mg/dL — AB (ref 70–99)
Potassium: 4 mmol/L (ref 3.5–5.1)
Sodium: 128 mmol/L — ABNORMAL LOW (ref 135–145)

## 2014-11-08 LAB — GLUCOSE, CAPILLARY
GLUCOSE-CAPILLARY: 97 mg/dL (ref 70–99)
Glucose-Capillary: 207 mg/dL — ABNORMAL HIGH (ref 70–99)
Glucose-Capillary: 167 mg/dL — ABNORMAL HIGH (ref 70–99)
Glucose-Capillary: 161 mg/dL — ABNORMAL HIGH (ref 70–99)

## 2014-11-08 MED ORDER — ALTEPLASE 100 MG IV SOLR
2.0000 mg | Freq: Once | INTRAVENOUS | Status: AC
  Administered 2014-11-08: 2 mg
  Filled 2014-11-08: qty 2

## 2014-11-08 NOTE — Progress Notes (Signed)
Speech Language Pathology Session Note & Discharge Summary  Patient Details  Name: [name] MRN: [id number] Date of Birth: Jul 31, 1950  Today's Date: 11/08/2014 SLP Individual Time: 1300-1400 SLP Individual Time Calculation (min): 60 min  Skilled Therapeutic Interventions:  Skilled treatment session focused on cognitive goals and family education. Patient was administered the [facility] Cognitive Assessment (MoCA) and scored a 28/30 with a score of 26 or above considered normal. Patient and his wife educated on memory compensatory strategies to utilize at home to increase recall and safety, need for a 4 time a day pill box for medication management and assistance with medications. Both verbalized understanding and handouts were given to reinforce information.  Both the patient and his wife report that the patient is at his cognitive baseline, therefore, patient will be discharged from SLP caseload and f/u is not warranted at this time.   Patient has met 2 of 2 long term goals.  Patient to discharge at overall Supervision;Modified Independent level.   Reasons goals not met: N/A   Clinical Impression/Discharge Summary: Patient has made functional gains and has met 2 of 2 LTG's this admission. Currently, patient is Mod I for functional problem solving for basic and familiar tasks and requires supervision A for mildly complex tasks.  Patient is also Mod I-supervision for recall of new, daily information. Both the patient and his wife report that the patient is at his cognitive baseline, therefore, skilled SLP f/u is not warranted at this time. Patient/family education complete and patient will be discharged from SLP caseload and will eventually d/c home with 24 hour supervision from wife.   Care Partner:  Caregiver Able to Provide Assistance: Yes  Type of Caregiver Assistance: Cognitive  Recommendation:  24 hour supervision/assistance      Equipment: N/A   Reasons for discharge: Treatment  goals met   Patient/Family Agrees with Progress Made and Goals Achieved: Yes   See FIM for current functional status  [name] 11/08/2014, 4:55 PM

## 2014-11-08 NOTE — Progress Notes (Signed)
Occupational Therapy Note  Patient Details  Name: ABDULRAHEEM Burch MRN: [id number] Date of Birth: 10-09-1950  Today's Date: 11/08/2014 OT Individual Time: 1400-1430 OT Individual Time Calculation (min): 30 min   Pt denied pain Individual Therapy  Pt resting in bed upon arrival and agreeable to participating in therapy.  Pt sat EOB with supervision to practice use of AE (sock aide and reacher) to doff/don socks.  Pt practiced tasks X 2 requiring more than reasonable amount of time to complete tasks.  Pt amb in room with RW for simple home mgmt tasks before sitting in straight back chair with wife present.  Pt's wife informed that Hip Kits were available in gift shop.  Focus on AE use to increased independence with BADLs and reduce burden of care.    [name] [facility] 11/08/2014, 2:56 PM

## 2014-11-08 NOTE — Progress Notes (Signed)
Physical Therapy Session Note  Patient Details  Name: [name] MRN: [id number] Date of Birth: [birth year]  Today's Date: 11/08/2014 PT Individual Time: 0800-0900 PT Individual Time Calculation (min): 60 min   Short Term Goals: Week 2:  PT Short Term Goal 1 (Week 2): = LTGs due to anticipated LOS  Skilled Therapeutic Interventions/Progress Updates:   Session focused on practicing actual car transfer and hands-on training with wife. Patient performed stand pivot transfers using RW with S. Performed car transfer to patient's actual car using RW with mod verbal cues for sequencing, min A x 1 to prevent from hitting head due to uncontrolled descent to seat and min A x 1 with wife providing appropriate cues/assisting with repositioning in seat. Discharge planning with patient and wife to determine safest home entry with R rail and 8 steps at front entrance due to lack of rails and terrain at side and back entrance. Stair training up/down 6 stairs to simulate home environment using R rail ascending with wife providing L min HHA. Patient ambulated back to room with wife providing supervision and left sitting in wheelchair with elevating leg rests donned and all needs within reach, wife present.   Therapy Documentation Precautions:  Precautions Precautions: Fall Restrictions Weight Bearing Restrictions: No Pain: Pain Assessment Pain Assessment: 0-10 Pain Score: 8  Pain Type: Acute pain Pain Location: Shoulder Pain Orientation: Left Pain Descriptors / Indicators: Aching Pain Frequency: Intermittent Pain Onset: Gradual Patients Stated Pain Goal: 4 Pain Intervention(s): Medication (See eMAR)  See FIM for current functional status  Therapy/Group: Individual Therapy  Laretta Alstrom 11/08/2014, 9:48 AM

## 2014-11-08 NOTE — Plan of Care (Signed)
Problem: RH Wheelchair Mobility Goal: LTG Patient will propel w/c in controlled environment (PT) LTG: Patient will propel wheelchair in controlled environment, # of feet with assist (PT)  Outcome: Not Applicable Date Met:  02/58/52 Patient ambulatory at d/c.

## 2014-11-08 NOTE — Patient Care Conference (Signed)
Inpatient RehabilitationTeam Conference and Plan of Care Update Date: 11/07/2014   Time: 2:55 PM    Patient Name: [name]      Medical Record Number: [id number]  Date of Birth: 1951-04-05 Sex: Male         Room/Bed: 4M07C/4M07C-01 Payor Info: Payor: [employer] / Plan: [employer] MANAGED / Product Type: *No Product type* /    Admitting Diagnosis: Cervical epidural abscess   Admit Date/Time:  10/25/2014  5:35 PM Admission Comments: No comment available   Primary Diagnosis:  Abscess in epidural space of cervical spine Principal Problem: Abscess in epidural space of cervical spine  Patient Active Problem List   Diagnosis Date Noted  . Ascites 10/30/2014  . Ileus, postoperative 10/26/2014  . Left cervical radiculopathy 10/25/2014  . Cervical myelopathy 10/25/2014  . Thrombocytopenia 10/22/2014  . Group B streptococcal infection   . Abscess in epidural space of cervical spine   . Encephalopathy acute   . Streptococcal bacteremia   . NASH (nonalcoholic steatohepatitis)   . Other cirrhosis of liver   . Diabetes mellitus type 2, uncontrolled   . 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 UGIB (upper gastrointestinal bleed) 10/27/2012  . Anemia due to acute blood loss 10/27/2012  . Volume depletion 10/27/2012  . 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 GERD 03/28/2010  . CONSTIPATION 03/28/2010  . ABDOMINAL PAIN, GENERALIZED 03/28/2010  . ASCITES 06/13/2008  . PERSONAL HX COLONIC POLYPS 06/13/2008  . DM 09/27/2007  . OBESITY 09/27/2007  . PANCYTOPENIA 09/27/2007  . HYPERTENSION 09/27/2007  . INTERNAL HEMORRHOIDS 09/27/2007  . CIRRHOSIS 09/27/2007  . NEPHROLITHIASIS 09/27/2007  . IRON DEFICIENCY ANEMIA, HX OF 09/27/2007  . Personal history of other diseases of digestive system 09/27/2007  . COLONIC POLYPS, ADENOMATOUS 11/19/2006  . ESOPHAGEAL VARICES 11/19/2006  . DUODENITIS 11/19/2006  . ESOPHAGITIS, REFLUX 09/23/2004  . ESOPHAGEAL STRICTURE 09/23/2004  . Portal  hypertension 09/23/2004  . DIVERTICULOSIS, COLON 08/14/2004    Expected Discharge Date: Expected Discharge Date: 11/11/14  Team Members Present: Physician leading conference: Dr. Alger Simons Social Worker Present: Lennart Pall, LCSW Nurse Present: Heather Roberts, RN PT Present: Carney Living, PT OT Present: Roanna Epley, Griffin Basil, OT SLP Present: Weston Anna, SLP PPS Coordinator present : Daiva Nakayama, RN, CRRN     Current Status/Progress Goal Weekly Team Focus  Medical   ascites still an issue with le edema. pain left foot. ?oa  improve activity tolerance  edema control, ascites mgt   Bowel/Bladder   Continent of bowel; has had a few incontinent episodes- may be r/t laxatives?; LBM 5/2; foley for urinary retention (also on multiple diuretics)  Mod I  Attempt voiding trial soon? Assess for need of laxatives   Swallow/Nutrition/ Hydration             ADL's   min A bathing and UB dressing, min-mod A LB dressing, min-supervision functoinal transfers  supervision overall with min A LB dressing  L NMR, functional transfers, standing balance, activity tolerance, family education, attention to L, strengthening   Mobility   supervision overall except min A stairs   supervision-mod I except min A stairs  NMR, functional mobility, safety, standing balance, L attention, cervical ROM, generalized strengthening, activity tolerance, pt/family education   Communication             Safety/Cognition/ Behavioral Observations  Supervision-Min A  Supervision   mildly complex problem solving, family education    Pain   C/o pain in neck and left shoulder; MS contin  15mg  po bid; oxycodone 5mg  po q4h prn; lidoderm patch; robaxin 500mg  po q6h prn  < 4  Assess and treat for pain q shift and prn; monitor for effectiveness   Skin   Red, swollen sacrum with yeast; stage 2 sacrum; 3+ edema to BLE  Min assist  Assess skin q shift and prn    Rehab Goals Rehab Goals Revised: concern due ongoing  issues with ascites and affecting tx goals *See Care Plan and progress notes for long and short-term goals.  Barriers to Discharge: see prior, stamina    Possible Resolutions to Barriers:  pacing, increase activity tolerance    Discharge Planning/Teaching Needs:  home with wife to provide 24/7 assistance, however, wife and son concerned that wife may not be able to handle care needs - potential for d/c plan to change to SNF      Team Discussion:  Still issues with ascites;  Pain in left ankle.  Multiple pain complaints.  On target to meet supervision goals and GI to continue following through d/c  Revisions to Treatment Plan:  None   Continued Need for Acute Rehabilitation Level of Care: The patient requires daily medical management by a physician with specialized training in physical medicine and rehabilitation for the following conditions: Daily direction of a multidisciplinary physical rehabilitation program to ensure safe treatment while eliciting the highest outcome that is of practical value to the patient.: Yes Daily medical management of patient stability for increased activity during participation in an intensive rehabilitation regime.: Yes Daily analysis of laboratory values and/or radiology reports with any subsequent need for medication adjustment of medical intervention for : Pulmonary problems;Neurological problems  [name], [place] 11/08/2014, 10:16 AM

## 2014-11-08 NOTE — Progress Notes (Signed)
[place] PHYSICAL MEDICINE & REHABILITATION     PROGRESS NOTE    Subjective/Complaints: Seemed to sleep last night. Left ankle/neck still sore.    Objective: Vital Signs: Blood pressure 102/51, pulse 75, temperature 98.7 F (37.1 C), temperature source Oral, resp. rate 18, height 5\' 10"  (1.778 m), weight 91.2 kg (201 lb 1 oz), SpO2 94 %. No results found.  Recent Labs  11/07/14 0900  WBC 7.6  HGB 11.9*  HCT 34.5*  PLT 72*    Recent Labs  11/06/14 0425 11/07/14 0900  NA 129* 129*  K 4.1 3.9  CL 96* 94*  GLUCOSE 153* 152*  BUN 9 9  CREATININE 0.54* 0.58*  CALCIUM 8.5* 8.7*   CBG (last 3)   Recent Labs  11/07/14 1646 11/07/14 2058 11/08/14 0647  GLUCAP 152* 283* 97    Wt Readings from Last 3 Encounters:  11/07/14 91.2 kg (201 lb 1 oz)  06/13/14 88.905 kg (196 lb)  02/07/14 89.812 kg (198 lb)    Physical Exam:  Nursing note and vitals reviewed. Constitutional: He is oriented to person, place, and time. He appears well-developed and well-nourished.  HENT:  Head: Normocephalic and atraumatic.  Eyes: Conjunctivae are normal. Pupils are equal, round, and reactive to light.  Neck: Muscular tenderness present. Decreased range of motion present.     Cardiovascular: Normal rate and regular rhythm.  Respiratory: Effort normal and diminished basilar breath sounds . No respiratory distress. bilateral wheezes.  GI: Abdomen softer but sill with ongoing ascites/ distention. Bowel sounds are improved. There is no tenderness.  Musculoskeletal: He exhibits edema (1++ edema pedally). Mild scrotal edema. Left foot sore along 5th MT Neck pain with ROM bilateral hips and LUE.  Neurological: He is alert and oriented to person, place, and time. Coordination abnormal.  Left inattention due to neck and shoulder pain limiting ROM.Marland Kitchen   speech clear and is able to follow basic commands without difficulty. RUE 4+/5. L- deltoid 2/5, 3- biceps, 3+triceps, intrinsics 4+/5. BLE  3+/5 with ataxia. No obvious sensory loss to LT/P.  DTR's 1+  Skin: Skin is warm and dry.    Psychiatric: His speech is normal.  He's alert   Assessment/Plan: 1. Functional deficits secondary to cervical epidural abscess which require 3+ hours per day of interdisciplinary therapy in a comprehensive inpatient rehab setting. Physiatrist is providing close team supervision and 24 hour management of active medical problems listed below. Physiatrist and rehab team continue to assess barriers to discharge/monitor patient progress toward functional and medical goals.  FIM: FIM - Bathing Bathing Steps Patient Completed: Chest, Abdomen, Front perineal area, Buttocks, Right upper leg, Left upper leg, Left Arm, Left lower leg (including foot), Right lower leg (including foot) Bathing: 4: Min-Patient completes 8-9 75f 10 parts or 75+ percent  FIM - Upper Body Dressing/Undressing Upper body dressing/undressing steps patient completed: Thread/unthread right sleeve of pullover shirt/dresss, Put head through opening of pull over shirt/dress, Thread/unthread left sleeve of pullover shirt/dress Upper body dressing/undressing: 4: Min-Patient completed 75 plus % of tasks FIM - Lower Body Dressing/Undressing Lower body dressing/undressing steps patient completed: Thread/unthread right underwear leg, Thread/unthread left underwear leg, Pull underwear up/down, Thread/unthread right pants leg, Thread/unthread left pants leg, Pull pants up/down Lower body dressing/undressing: 4: Min-Patient completed 75 plus % of tasks  FIM - Toileting Toileting steps completed by patient: Adjust clothing prior to toileting Toileting Assistive Devices: Grab bar or rail for support Toileting: 2: Max-Patient completed 1 of 3 steps  FIM - Personnel officer  Chief of Staff Devices: Elevated toilet seat, Grab bars, Insurance account manager Transfers: 4-To toilet/BSC: Min A (steadying Pt. > 75%)  FIM - Ship broker Devices: Arm rests, Copy: 5: Bed > Chair or W/C: Supervision (verbal cues/safety issues), 5: Chair or W/C > Bed: Supervision (verbal cues/safety issues)  FIM - Locomotion: Wheelchair Locomotion: Wheelchair: 1: Travels less than 50 ft with supervision, cueing or coaxing FIM - Locomotion: Ambulation Locomotion: Ambulation Assistive Devices: Administrator Ambulation/Gait Assistance: 5: Supervision Locomotion: Ambulation: 1: Travels less than 50 ft with supervision/safety issues  Comprehension Comprehension Mode: Auditory Comprehension: 5-Follows basic conversation/direction: With extra time/assistive device  Expression Expression Mode: Verbal Expression: 5-Expresses basic needs/ideas: With no assist  Social Interaction Social Interaction: 5-Interacts appropriately 90% of the time - Needs monitoring or encouragement for participation or interaction.  Problem Solving Problem Solving: 4-Solves basic 75 - 89% of the time/requires cueing 10 - 24% of the time  Memory Memory: 5-Recognizes or recalls 90% of the time/requires cueing < 10% of the time  Medical Problem List and Plan: 1. 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 DVT Prophylaxis/Anticoagulation: Mechanical:  Antiembolism stockings, knee (TED hose) Bilateral lower extremities Sequential compression devices, below knee Bilateral lower extremities  -recheck doppler left leg (still pending!) 3. Pain Management: will add low dose ms contin and observe for effect---may not be able to tolerate however.  -continue oxycodone 5mg  prn  .  -lidoderm, ice/heat, posture 4. Mood:   provide ego support.  5. Neuropsych: This patient is capable of making decisions on his own behalf. 6. Skin/Wound Care: Routine pressure relief measures. Maintain adequate nutritional support.   7. Fluids/Electrolytes/Nutrition: hyponatremia likely multifactorial, daily bmet for now  8. 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 Ascites on u/s, abdominal distention on exam   -spironolactone/lasix per GI---titrated upwards  -keeping an eye on electrolytes, Na+  -mgt per GI  -weight 91kg 9. Thrombocytopenia: Stable at 36 10. DM type 2: Hgb A1c- 8.7. Monitor BS with ac/hs checks and will use SSI for elevated BS.   - resumed Tradjenta as was on Invokana and Tradjenta at home.  -some improvement  11. Reactive leucocytosis: Slowly resolving with treatment of infection.  12. Ileus: resolved. Stools now loose from lactulose  -d/c'ed PO reglan 13. Urine retention: foley (with diuretic on board)  -ucx negative     LOS (Days) 14 A FACE TO FACE EVALUATION WAS PERFORMED  [name] 11/08/2014 7:34 AM

## 2014-11-08 NOTE — Progress Notes (Signed)
Occupational Therapy Session Note  Patient Details  Name: [name] MRN: [id number] Date of Birth: February 04, 1951  Today's Date: 11/08/2014 OT Individual Time: 0900-1000 OT Individual Time Calculation (min): 60 min    Short Term Goals: Week 2:  OT Short Term Goal 1 (Week 2): Pt will complete LB dressing with min assist OT Short Term Goal 2 (Week 2): Pt will complete toilet task with steadying assist for balance only OT Short Term Goal 3 (Week 2): Pt will complete toilet transfer at supervision level OT Short Term Goal 4 (Week 2): Pt will utilize LUE at gross assist level during self-care task with min cues  Skilled Therapeutic Interventions/Progress Updates:    Pt engaged in BADL retraining including bathing at shower level and dressing with sit<>stand from w/c.  Pt amb with RW to bathroom (steady A) and completed bathing tasks with sit<>stand from TTB.  Pt completed tasks with steady A.  Pt required assistance with donning socks without AE this morning.  Pt required more than reasonable amount of time to complete all tasks with multiple rest breaks.  Focus on activity tolerance, sit<>stand, functional amb with RW, standing balance, and safety awareness.  Therapy Documentation Precautions:  Precautions Precautions: Fall Restrictions Weight Bearing Restrictions: No  Pain: Pain Assessment Pain Assessment: No/denies pain Pain Score: 5  Pain Type: Acute pain Pain Location: Shoulder Pain Orientation: Left Pain Descriptors / Indicators: Aching Pain Frequency: Intermittent Pain Onset: Gradual Patients Stated Pain Goal: 4 Pain Intervention(s): Pre medicated prior to therapy See FIM for current functional status  Therapy/Group: Individual Therapy  Leroy Libman 11/08/2014, 11:04 AM

## 2014-11-09 ENCOUNTER — Inpatient Hospital Stay (HOSPITAL_COMMUNITY): Payer: Managed Care, Other (non HMO)

## 2014-11-09 ENCOUNTER — Inpatient Hospital Stay (HOSPITAL_COMMUNITY): Payer: Managed Care, Other (non HMO) | Admitting: Occupational Therapy

## 2014-11-09 ENCOUNTER — Inpatient Hospital Stay (HOSPITAL_COMMUNITY): Payer: Managed Care, Other (non HMO) | Admitting: Physical Therapy

## 2014-11-09 LAB — GLUCOSE, CAPILLARY
GLUCOSE-CAPILLARY: 137 mg/dL — AB (ref 70–99)
GLUCOSE-CAPILLARY: 166 mg/dL — AB (ref 70–99)
Glucose-Capillary: 165 mg/dL — ABNORMAL HIGH (ref 70–99)
Glucose-Capillary: 146 mg/dL — ABNORMAL HIGH (ref 70–99)

## 2014-11-09 LAB — BASIC METABOLIC PANEL
Anion gap: 5 (ref 5–15)
BUN: 9 mg/dL (ref 6–20)
CO2: 30 mmol/L (ref 22–32)
Calcium: 8.3 mg/dL — ABNORMAL LOW (ref 8.9–10.3)
Chloride: 95 mmol/L — ABNORMAL LOW (ref 101–111)
Creatinine, Ser: 0.66 mg/dL (ref 0.61–1.24)
GFR calc Af Amer: >60 mL/min (ref >60)
GFR calc non Af Amer: >60 mL/min (ref >60)
Glucose, Bld: 137 mg/dL — ABNORMAL HIGH (ref 70–99)
POTASSIUM: 3.9 mmol/L (ref 3.5–5.1)
Sodium: 130 mmol/L — ABNORMAL LOW (ref 135–145)

## 2014-11-09 NOTE — Progress Notes (Signed)
Occupational Therapy Session Note  Patient Details  Name: [name] MRN: [id number] Date of Birth: [birth year]  Today's Date: 11/09/2014 OT Individual Time: 1430-1500 OT Individual Time Calculation (min): 30 min    Short Term Goals: Week 2:  OT Short Term Goal 1 (Week 2): Pt will complete LB dressing with min assist OT Short Term Goal 2 (Week 2): Pt will complete toilet task with steadying assist for balance only OT Short Term Goal 3 (Week 2): Pt will complete toilet transfer at supervision level OT Short Term Goal 4 (Week 2): Pt will utilize LUE at gross assist level during self-care task with min cues  Skilled Therapeutic Interventions/Progress Updates:    Upon entering the room, pt supine in bed resting but easily awaken for session. Pt very excited that he will be discharging home soon but anxious about home health OT setting. OT provided educating for pt regarding expectations, home evaluation to decrease fall risk and increase safety, and how to be an advocate for self by reporting goals for self. Pt verbalized understanding and asked questions as appropriate about this setting until satisfied that all questions had been answered. Pt performed supine >sit with supervision. Stand pivot transfer into wheelchair from bed with supervision as well. Pt seated in wheelchair and requesting to finish eating lunch. While pt eating, therapist continued to educate pt on OT purpose and progress towards goals. Pt showing good safety awareness in session as well as insight to limitations at this time. Pt seated in wheelchair with call bell and all needed items within reach upon exiting the room.   Therapy Documentation Precautions:  Precautions Precautions: Fall Restrictions Weight Bearing Restrictions: No Vital Signs: Therapy Vitals Temp: 98.4 F (36.9 C) Temp Source: Oral Pulse Rate: 74 Resp: 18 BP: 120/67 mmHg Patient Position (if appropriate): Lying Oxygen Therapy SpO2: 99 % O2  Device: Not Delivered Pain: Pain Assessment Pain Assessment: 0-10 Pain Score: 2  Faces Pain Scale: Hurts a little bit Pain Type: Acute pain Pain Location: Neck Pain Descriptors / Indicators: Aching Pain Intervention(s): Medication (See eMAR) PAINAD (Pain Assessment in Advanced Dementia) Breathing: normal  See FIM for current functional status  Therapy/Group: Individual Therapy  Phineas Semen 11/09/2014, 3:52 PM

## 2014-11-09 NOTE — Progress Notes (Signed)
[facility] PHYSICAL MEDICINE & REHABILITATION     PROGRESS NOTE    Subjective/Complaints: Up with therapy getting showered. Swelling a little better.   Objective: Vital Signs: Blood pressure 124/74, pulse 81, temperature 98.7 F (37.1 C), temperature source Oral, resp. rate 18, height 5\' 10"  (1.778 m), weight 88.27 kg (194 lb 9.6 oz), SpO2 98 %. No results found.  Recent Labs  11/07/14 0900  WBC 7.6  HGB 11.9*  HCT 34.5*  PLT 72*    Recent Labs  11/08/14 0817 11/09/14 0543  NA 128* 130*  K 4.0 3.9  CL 93* 95*  GLUCOSE 137* 137*  BUN 8 9  CREATININE 0.61 0.66  CALCIUM 8.8* 8.3*   CBG (last 3)   Recent Labs  11/08/14 1632 11/08/14 2116 11/09/14 0649  GLUCAP 167* 161* 137*    Wt Readings from Last 3 Encounters:  11/09/14 88.27 kg (194 lb 9.6 oz)  06/13/14 88.905 kg (196 lb)  02/07/14 89.812 kg (198 lb)    Physical Exam:  Nursing note and vitals reviewed. Constitutional: He is oriented to person, place, and time. He appears well-developed and well-nourished.  HENT:  Head: Normocephalic and atraumatic.  Eyes: Conjunctivae are normal. Pupils are equal, round, and reactive to light.  Neck: Muscular tenderness present. Decreased range of motion present.     Cardiovascular: Normal rate and regular rhythm.  Respiratory: Effort normal and diminished basilar breath sounds . No respiratory distress. bilateral wheezes.  GI: Abdomen softer but sill with ongoing ascites/ distention. Bowel sounds are improved. There is no tenderness.  Musculoskeletal: He exhibits edema (1+ edema pedally). Minimal scrotal edema.  Neck pain with ROM bilateral hips and LUE.  Neurological: He is alert and oriented to person, place, and time. Coordination abnormal.  Left inattention due to neck and shoulder pain limiting ROM.Marland Kitchen   speech clear and is able to follow basic commands without difficulty. RUE 4+/5. L- deltoid 2/5, 3- biceps, 3+triceps, intrinsics 4+/5. BLE 3+/5 with  ataxia. No sensory loss to LT/P.  DTR's 1+  Skin: Skin is warm and dry.    Psychiatric: His speech is normal.  He's alert   Assessment/Plan: 1. Functional deficits secondary to cervical epidural abscess which require 3+ hours per day of interdisciplinary therapy in a comprehensive inpatient rehab setting. Physiatrist is providing close team supervision and 24 hour management of active medical problems listed below. Physiatrist and rehab team continue to assess barriers to discharge/monitor patient progress toward functional and medical goals.  FIM: FIM - Bathing Bathing Steps Patient Completed: Chest, Abdomen, Front perineal area, Buttocks, Right upper leg, Left upper leg, Left Arm, Left lower leg (including foot), Right lower leg (including foot) Bathing: 4: Min-Patient completes 8-9 73f 10 parts or 75+ percent  FIM - Upper Body Dressing/Undressing Upper body dressing/undressing steps patient completed: Thread/unthread right sleeve of pullover shirt/dresss, Put head through opening of pull over shirt/dress, Thread/unthread left sleeve of pullover shirt/dress, Pull shirt over trunk Upper body dressing/undressing: 5: Supervision: Safety issues/verbal cues FIM - Lower Body Dressing/Undressing Lower body dressing/undressing steps patient completed: Thread/unthread right underwear leg, Thread/unthread left underwear leg, Pull underwear up/down, Thread/unthread right pants leg, Thread/unthread left pants leg, Pull pants up/down Lower body dressing/undressing: 4: Min-Patient completed 75 plus % of tasks  FIM - Toileting Toileting steps completed by patient: Adjust clothing prior to toileting Toileting Assistive Devices: Grab bar or rail for support Toileting: 2: Max-Patient completed 1 of 3 steps  FIM - Radio producer Devices: Elevated toilet  seat, Grab bars, Insurance account manager Transfers: 4-To toilet/BSC: Min A (steadying Pt. > 75%)  FIM - Financial trader Devices: Arm rests, Copy: 5: Bed > Chair or W/C: Supervision (verbal cues/safety issues), 5: Chair or W/C > Bed: Supervision (verbal cues/safety issues)  FIM - Locomotion: Wheelchair Locomotion: Wheelchair: 1: Total Assistance/staff pushes wheelchair (Pt<25%) FIM - Locomotion: Ambulation Locomotion: Ambulation Assistive Devices: Administrator Ambulation/Gait Assistance: 5: Supervision Locomotion: Ambulation: 5: Travels 150 ft or more with supervision/safety issues  Comprehension Comprehension Mode: Auditory Comprehension: 5-Understands complex 90% of the time/Cues < 10% of the time  Expression Expression Mode: Verbal Expression: 6-Expresses complex ideas: With extra time/assistive device  Social Interaction Social Interaction: 6-Interacts appropriately with others with medication or extra time (anti-anxiety, antidepressant).  Problem Solving Problem Solving: 5-Solves complex 90% of the time/cues < 10% of the time  Memory Memory: 6-More than reasonable amt of time  Medical Problem List and Plan: 1. 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 DVT Prophylaxis/Anticoagulation: Mechanical:  Antiembolism stockings, knee (TED hose) Bilateral lower extremities Sequential compression devices, below knee Bilateral lower extremities 3. Pain Management: will add low dose ms contin and observe for effect---may not be able to tolerate however.  -continue oxycodone 5mg  prn  .  -lidoderm, ice/heat, posture 4. Mood:   provide ego support.  5. Neuropsych: This patient is capable of making decisions on his own behalf. 6. Skin/Wound Care: Routine pressure relief measures. Maintain adequate nutritional support.  7. Fluids/Electrolytes/Nutrition: hyponatremia likely multifactorial, daily bmet for now  8. 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 Ascites on u/s, abdominal distention on exam   -spironolactone/lasix per GI---titrated upwards  -daily bmet to monitor electrolytes/renal function  -mgt per GI  -weight 88kg 9. Thrombocytopenia: Stable at 36 10. DM type 2: Hgb A1c- 8.7. Monitor BS with ac/hs checks and will use SSI for elevated BS.   - resumed Tradjenta as was on Invokana and Tradjenta at home.  -some improvement  11. Reactive leucocytosis: Slowly resolving with treatment of infection.  12. Ileus: resolved. Stools now loose from lactulose    13. Urine retention: foley (with diuretic on board)  -ucx negative     LOS (Days) 15 A FACE TO FACE EVALUATION WAS PERFORMED  [name] 11/09/2014 7:53 AM

## 2014-11-09 NOTE — Progress Notes (Signed)
Physical Therapy Session Note  Patient Details  Name: [name] MRN: [id number] Date of Birth: 05-04-1951  Today's Date: 11/09/2014 PT Individual Time: 1000-1100 PT Individual Time Calculation (min): 60 min   Short Term Goals: Week 2:  PT Short Term Goal 1 (Week 2): = LTGs due to anticipated LOS  Skilled Therapeutic Interventions/Progress Updates:   Focus on functional mobility in preparation for discharge home and NMR. Patient required increased time to complete all functional mobility. Gait using RW throughout rehab unit (room > ADL apartment > day room > gym) in controlled and home environments with supervision. Patient performed furniture transfers using RW and bed mobility on regular bed in ADL apartment with overall supervision. Patient instructed to perform sit <> stand x 5 from armless chair via pushing up on BLE using BUE with initial mod A progressed to supervision and cues for eccentric control on descent. Patient required prolonged rest after sit <> stand training due to BLE weakness and fatigue. Stair training up/down twelve 6" steps using RUE support on rail to simulate home environment with overall supervision. NuStep using BUE/BLE at level 4-6 x 10 min for NMR and activity tolerance. Patient left sitting in wheelchair to return to room with rehab tech.   Therapy Documentation Precautions:  Precautions Precautions: Fall Restrictions Weight Bearing Restrictions: No Pain: Pain Assessment Pain Assessment: No/denies pain  See FIM for current functional status  Therapy/Group: Individual Therapy  Laretta Alstrom 11/09/2014, 11:52 AM

## 2014-11-09 NOTE — Progress Notes (Signed)
Occupational Therapy Session Note  Patient Details  Name: [name] MRN: [id number] Date of Birth: 31-Jan-1951  Today's Date: 11/09/2014 OT Individual Time: 0700-0800 OT Individual Time Calculation (min): 60 min    Short Term Goals: Week 2:  OT Short Term Goal 1 (Week 2): Pt will complete LB dressing with min assist OT Short Term Goal 2 (Week 2): Pt will complete toilet task with steadying assist for balance only OT Short Term Goal 3 (Week 2): Pt will complete toilet transfer at supervision level OT Short Term Goal 4 (Week 2): Pt will utilize LUE at gross assist level during self-care task with min cues  Skilled Therapeutic Interventions/Progress Updates:    Pt seen for ADL retraining with focus on functional mobility, standing balance, activity tolerance, functional use of LUE, and use of AE. Pt received supine in bed. Pt ambulated bed>bathroom at supervision level using RW. Completed bathing at shower level with supervision and use of LH sponge for BLEs. Pt completed washing of RUE with use of self-hand over hand for positioning of LUE. Pt completed dressing sit<>stand from w/c with min A for donning socks only. Practiced with sock-aid however unable to completely don secondary to edema. Pt utilized reacher with increased time for threading BLEs. At end of session pt left sitting in w/c at sink to complete oral care (RN notified).   Therapy Documentation Precautions:  Precautions Precautions: Fall Restrictions Weight Bearing Restrictions: No General:   Vital Signs:   Pain: Pain Assessment Pain Assessment: 0-10 Pain Score: 8  Pain Type: Acute pain Pain Location: Neck Pain Descriptors / Indicators: Aching Pain Intervention(s): Medication (See eMAR)  See FIM for current functional status  Therapy/Group: Individual Therapy  Duayne Cal 11/09/2014, 1:41 PM

## 2014-11-09 NOTE — Progress Notes (Signed)
Daily Rounding Note  11/09/2014, 9:33 AM  LOS: 15 days   SUBJECTIVE:       Feels better after large BM yesterday.  Belly less distended.  No abd pain.  No nausea  OBJECTIVE:         Vital signs in last 24 hours:    Temp:  [98.5 F (36.9 C)-98.7 F (37.1 C)] 98.7 F (37.1 C) (05/05 0535) Pulse Rate:  [69-81] 81 (05/05 0535) Resp:  [18] 18 (05/05 0535) BP: (124-126)/(66-74) 124/74 mmHg (05/05 0535) SpO2:  [96 %-98 %] 98 % (05/05 0535) Weight:  [194 lb 9.6 oz (88.27 kg)-210 lb 6.4 oz (95.437 kg)] 194 lb 9.6 oz (88.27 kg) (05/05 0535) Last BM Date: 11/07/14 Filed Weights   11/07/14 0700 11/08/14 1223 11/09/14 0535  Weight: 201 lb 1 oz (91.2 kg) 210 lb 6.4 oz (95.437 kg) 194 lb 9.6 oz (88.27 kg)   General: pleasant, somewht frail, pale.  Seen during PT session.  Heart: RRR.  No MRG Chest: clear bil.  No cough or labored breathing Abdomen: softer, still protuberant  Extremities: + bil LE edema, 2 to 3+ Neuro/Psych:  Oriented x 3.  More alert.    Intake/Output from previous day: 05/04 0701 - 05/05 0700 In: 730 [P.O.:720; I.V.:10] Out: 2300 [Urine:2300]  Intake/Output this shift: Total I/O In: 120 [P.O.:120] Out: -   Lab Results:  Recent Labs  11/07/14 0900  WBC 7.6  HGB 11.9*  HCT 34.5*  PLT 72*   BMET  Recent Labs  11/07/14 0900 11/08/14 0817 11/09/14 0543  NA 129* 128* 130*  K 3.9 4.0 3.9  CL 94* 93* 95*  CO2 27 26 30   GLUCOSE 152* 137* 137*  BUN 9 8 9   CREATININE 0.58* 0.61 0.66  CALCIUM 8.7* 8.8* 8.3*   Scheduled Meds: . bisacodyl  10 mg Rectal QPC supper  . cefTRIAXone (ROCEPHIN)  IV  2 g Intravenous Q12H  . feeding supplement (PRO-STAT SUGAR FREE 64)  30 mL Oral TID  . furosemide  80 mg Oral Daily  . glimepiride  6 mg Oral Q breakfast  . insulin aspart  0-15 Units Subcutaneous TID WC  . insulin aspart  0-5 Units Subcutaneous QHS  . lactulose  20 g Oral BID  . lidocaine  2 patch  Transdermal Q24H  . linagliptin  5 mg Oral Daily  . morphine  15 mg Oral Q12H  . pantoprazole  40 mg Oral q morning - 10a  . propranolol  20 mg Oral q morning - 10a  . senna-docusate  2 tablet Oral BID  . simethicone  160 mg Oral QID  . spironolactone  200 mg Oral Daily  . Vitamin D (Ergocalciferol)  50,000 Units Oral Q Mon   Continuous Infusions:  PRN Meds:.sodium chloride, acetaminophen, alum & mag hydroxide-simeth, benzonatate, diphenhydrAMINE, guaiFENesin-dextromethorphan, methocarbamol, naLOXone (NARCAN)  injection, ondansetron **OR** ondansetron (ZOFRAN) IV, oxyCODONE, sodium chloride, sodium phosphate, traZODone   ASSESMENT:   * Long-standing cirrhosis due to [place]. Followed at [facility]. Up-to-date with MRI screening for [place]. AFP level 10/13/2014 8.8. HepA/B vaccination series in 2015.  4/16 PT/INR 17/1.3 so doing well from synthetic parameter.   * Ascites. Paracentesis x 3 liters 4/26: no evidence SBP. Due to undefined fluid albumin level unable to calculate SAAG. Low dose aldactone/lasix (50/20) initiated 4/25. Increased to 200/80 mg 5/4. Widely fluctuating weights.  So far no renal dysfunction and BP tolerating diuretics.   * Hyponatremia. Fluctuating Na  levels.   * Thrombocytopenia. Improved from nadir in mid 20s.   * Long-standing history of esophageal varices, portal hypertension and portal colopathy. Up to date with screening EGD as of 06/2014 (no banding required). On chronic propranolol.  * Cervical spine abscess, group B strep. Left sided weakness. On long course Rocephin. Taking oxycodone for neck and left shoulder pain.   * Anemia. Hgb up 1.5 grams in 24 hours.   * Urinary retention, foley in place since last week.   * Constipation, resolved. Added Lactulose bid on 4/26 for this and for Ammonia level of 56 on 4/21 and ? If his sleepiness which might represent early HE.    * Type 2 diabetes. On multiple oral agents prior to admission     PLAN   *  Leave on current 220/80 of Aldactone/Lasix.   *  Will arrange GI OV [name] or APP) in next several weeks.  PMD will need to be checking BMET periodically, weekly for now.     [name]  11/09/2014, 9:33 AM Pager: [phone]   ________________________________________________________________________  Velora Heckler GI MD note:  I personally examined the patient, reviewed the data and agree with the assessment and plan described above.  Continue on current diuretics. Will need BMET weekly to watch for electrolyte, Cr issues.  Please call or page with any further questions or concerns.    Owens Loffler, MD [facility] Pager [phone]

## 2014-11-09 NOTE — Progress Notes (Signed)
Occupational Therapy Note  Patient Details  Name: [name] MRN: [id number] Date of Birth: Dec 04, 1950  Today's Date: 11/09/2014 OT Individual Time: 0900-1000 OT Individual Time Calculation (min): 60 min   Pt c/o 3/10 pain in left upper trapezius; premedicated Individual Therapy   Pt engaged in therapeutic tasks with focus on LUE use to increase strength and function.  Pt exhibits decreased shoulder flexion when sitting EOB.  Pt hikes shoulder when attempting to raise LUE past 75 degrees.  Pt transitioned to LUE exercises in supine to increase strength and functionality of LUE.  Focus on LUE function to increase independence with BADLs and decrease burden of care.  [name] [facility] 11/09/2014, 10:59 AM

## 2014-11-10 ENCOUNTER — Inpatient Hospital Stay (HOSPITAL_COMMUNITY): Payer: Managed Care, Other (non HMO) | Admitting: Occupational Therapy

## 2014-11-10 ENCOUNTER — Inpatient Hospital Stay (HOSPITAL_COMMUNITY): Payer: Managed Care, Other (non HMO) | Admitting: Physical Therapy

## 2014-11-10 ENCOUNTER — Inpatient Hospital Stay (HOSPITAL_COMMUNITY): Payer: Managed Care, Other (non HMO)

## 2014-11-10 LAB — GLUCOSE, CAPILLARY
GLUCOSE-CAPILLARY: 142 mg/dL — AB (ref 70–99)
Glucose-Capillary: 135 mg/dL — ABNORMAL HIGH (ref 70–99)
Glucose-Capillary: 127 mg/dL — ABNORMAL HIGH (ref 70–99)
Glucose-Capillary: 161 mg/dL — ABNORMAL HIGH (ref 70–99)

## 2014-11-10 LAB — BASIC METABOLIC PANEL
Anion gap: 7 (ref 5–15)
BUN: 9 mg/dL (ref 6–20)
CO2: 29 mmol/L (ref 22–32)
CREATININE: 0.61 mg/dL (ref 0.61–1.24)
Calcium: 8.5 mg/dL — ABNORMAL LOW (ref 8.9–10.3)
Chloride: 95 mmol/L — ABNORMAL LOW (ref 101–111)
GFR calc non Af Amer: >60 mL/min (ref >60)
GLUCOSE: 155 mg/dL — AB (ref 70–99)
Potassium: 3.9 mmol/L (ref 3.5–5.1)
Sodium: 131 mmol/L — ABNORMAL LOW (ref 135–145)

## 2014-11-10 MED ORDER — PRO-STAT SUGAR FREE PO LIQD: 30.0000 mL | Freq: Three times a day (TID) | ORAL | Status: DC

## 2014-11-10 MED ORDER — VITAMIN D (ERGOCALCIFEROL) 1.25 MG (50000 UNIT) PO CAPS: 50000.0000 [IU] | ORAL_CAPSULE | ORAL | Status: DC

## 2014-11-10 MED ORDER — OXYCODONE HCL 5 MG PO TABS: 5.0000 mg | ORAL_TABLET | ORAL | Status: DC | PRN

## 2014-11-10 MED ORDER — LACTULOSE 10 GM/15ML PO SOLN: 20.0000 g | Freq: Two times a day (BID) | ORAL | Status: DC

## 2014-11-10 MED ORDER — FUROSEMIDE 80 MG PO TABS: 80.0000 mg | ORAL_TABLET | Freq: Every day | ORAL | Status: DC

## 2014-11-10 MED ORDER — MORPHINE SULFATE ER 15 MG PO TBCR: 15.0000 mg | EXTENDED_RELEASE_TABLET | Freq: Two times a day (BID) | ORAL | Status: DC

## 2014-11-10 MED ORDER — GLIMEPIRIDE 4 MG PO TABS: 8.0000 mg | ORAL_TABLET | Freq: Every day | ORAL | Status: DC

## 2014-11-10 MED ORDER — LIDOCAINE 5 % EX PTCH: MEDICATED_PATCH | CUTANEOUS | Status: DC

## 2014-11-10 MED ORDER — SPIRONOLACTONE 100 MG PO TABS: 200.0000 mg | ORAL_TABLET | Freq: Every day | ORAL | Status: DC

## 2014-11-10 MED ORDER — GLIMEPIRIDE 4 MG PO TABS
8.0000 mg | ORAL_TABLET | Freq: Every day | ORAL | Status: DC
Start: 2014-11-11 — End: 2014-11-11
  Administered 2014-11-11: 8 mg via ORAL
  Filled 2014-11-10 (×2): qty 2

## 2014-11-10 MED ORDER — SENNOSIDES-DOCUSATE SODIUM 8.6-50 MG PO TABS: 2.0000 | ORAL_TABLET | Freq: Every day | ORAL | Status: DC

## 2014-11-10 MED ORDER — METHOCARBAMOL 500 MG PO TABS: 500.0000 mg | ORAL_TABLET | Freq: Four times a day (QID) | ORAL | Status: DC | PRN

## 2014-11-10 MED ORDER — SIMETHICONE 80 MG PO CHEW: 160.0000 mg | CHEWABLE_TABLET | Freq: Four times a day (QID) | ORAL | Status: DC | PRN

## 2014-11-10 MED ORDER — GLIMEPIRIDE 2 MG PO TABS
2.0000 mg | ORAL_TABLET | Freq: Once | ORAL | Status: AC
  Administered 2014-11-10: 2 mg via ORAL
  Filled 2014-11-10: qty 1

## 2014-11-10 MED ORDER — ACETAMINOPHEN 325 MG PO TABS: 325.0000 mg | ORAL_TABLET | ORAL | Status: DC | PRN

## 2014-11-10 MED ORDER — CEFTRIAXONE SODIUM IN DEXTROSE 40 MG/ML IV SOLN: 2.0000 g | INTRAVENOUS | Status: DC

## 2014-11-10 MED ORDER — SIMETHICONE 80 MG PO CHEW: 160.0000 mg | CHEWABLE_TABLET | Freq: Four times a day (QID) | ORAL | Status: DC

## 2014-11-10 NOTE — Discharge Instructions (Signed)
Inpatient Rehab Discharge Instructions  [name] Discharge date and time:  11/11/14  Activities/Precautions/ Functional Status: Activity: no lifting, driving, or strenuous exercise for till cleared by MD Diet: diabetic diet Wound Care: none needed Functional status:  ___ No restrictions     ___ Walk up steps independently _X__ 24/7 supervision/assistance   ___ Walk up steps with assistance ___ Intermittent supervision/assistance  ___ Bathe/dress independently _X__ Walk with walker    ___ Bathe/dress with assistance ___ Walk Independently    ___ Shower independently _X__ Walk with supervision     _X__ Shower with assistance _X__ No alcohol     ___ Return to work/school ________    COMMUNITY REFERRALS UPON DISCHARGE:    Home Health:   PT    OT     RN                     Agency: [facility] Phone: [phone]   Medical Equipment/Items Ordered: rolling walker                                                     Agency/Supplier: [facility] @ [phone]    Special Instructions: 1. Check your blood sugars three times a day before meals and record.  2. Discuss with urology consult with MD about voiding trial once ready to have foley removed/ diuretics decreased.   My questions have been answered and I understand these instructions. I will adhere to these goals and the provided educational materials after my discharge from the hospital.  Patient/Caregiver Signature _______________________________ Date __________  Clinician Signature _______________________________________ Date __________  Please bring this form and your medication list with you to all your follow-up doctor's appointments.

## 2014-11-10 NOTE — Progress Notes (Signed)
Physical Therapy Discharge Summary  Patient Details  Name: [name] MRN: [id number] Date of Birth: Jun 20, 1951  Today's Date: 11/10/2014 PT Individual Time: 1000-1100 PT Individual Time Calculation (min): 60 min    Patient has met 7 of 7 long term goals due to improved activity tolerance, improved balance, improved postural control, increased strength, increased range of motion, decreased pain, ability to compensate for deficits, functional use of  left upper extremity and left lower extremity, improved attention, improved awareness and improved coordination.  Patient to discharge at an ambulatory level Supervision.   Patient's care partner is independent to provide the necessary physical and cognitive assistance at discharge.  Reasons goals not met: NA  Recommendation:  Patient will benefit from ongoing skilled PT services in home health setting to continue to advance safe functional mobility, address ongoing impairments in muscle weakness, LLE > RLE edema, LUE > LLE hemiplegia, neck pain and decreased cervical ROM, decreased endurance, decreased standing balance, decreased balance strategies, decreased safety awareness, and minimize fall risk.  Equipment: RW  Reasons for discharge: treatment goals met and discharge from hospital  Patient/family agrees with progress made and goals achieved: Yes  Skilled Therapeutic Intervention Patient's wife present for continued hands-on family training in preparation for discharge. Patient at overall supervision level using RW. Patient instructed in [place] HEP for BLE strengthening and falls prevention and provided handout. Education provided on importance of pressure relief as well as duration and frequency, patient and wife verbalized understanding. Reviewed team safety recommendation sheet with patient and wife. See below for further discharge details. Patient and wife with no further questions regarding discharge.   PT  Discharge Precautions/Restrictions Restrictions Weight Bearing Restrictions: No Pain Pain Assessment Pain Score: 4  Pain Type: Acute pain Pain Location: Neck Pain Orientation: Left Pain Descriptors / Indicators: Aching Pain Onset: Gradual Vision/Perception   No changes from baseline  Cognition Overall Cognitive Status: History of cognitive impairments - at baseline Arousal/Alertness: Awake/alert Orientation Level: Oriented X4 Attention: Alternating Focused Attention: Appears intact Sustained Attention: Appears intact Alternating Attention: Appears intact Memory: Impaired Memory Impairment: Decreased recall of new information Awareness: Appears intact Problem Solving: Impaired Problem Solving Impairment: Functional complex Organizing: Appears intact Self Correcting: Appears intact Safety/Judgment: Appears intact Sensation Sensation Light Touch: Impaired Detail Light Touch Impaired Details: Impaired LLE Stereognosis: Not tested Hot/Cold: Appears Intact Proprioception: Appears Intact Coordination Gross Motor Movements are Fluid and Coordinated: No Fine Motor Movements are Fluid and Coordinated: No Coordination and Movement Description: L sided decreased speed and control of movement  Motor  Motor Motor: Abnormal postural alignment and control;Hemiplegia;Abnormal tone  Mobility Bed Mobility Bed Mobility: Supine to Sit;Sit to Supine Supine to Sit: 5: Supervision;HOB flat Sit to Supine: 5: Supervision;HOB flat Transfers Transfers: Yes Sit to Stand: 5: Supervision;With upper extremity assist;With armrests;From bed;From chair/3-in-1 Stand to Sit: 5: Supervision;With armrests;With upper extremity assist;To bed;To chair/3-in-1 Locomotion  Ambulation Ambulation: Yes Ambulation/Gait Assistance: 5: Supervision Ambulation Distance (Feet): 200 Feet Assistive device: Rolling walker Gait Gait: Yes Gait Pattern: Step-through pattern;Decreased step length - right;Decreased  stance time - left;Decreased stride length;Trunk flexed Gait velocity: 10 MWT using RW = 0.45 m/s Stairs / Additional Locomotion Stairs: Yes Stairs Assistance: 5: Supervision Stair Management Technique: One rail Right;Step to pattern;Alternating pattern;Forwards Number of Stairs: 12 Height of Stairs: 6 Ramp: 5: Supervision Curb: 5: Supervision Wheelchair Mobility Wheelchair Mobility: No (pt ambulatory)  Trunk/Postural Assessment  Cervical Assessment Cervical Assessment: Exceptions to [facility] (forward head) Cervical AROM Overall Cervical AROM: Deficits;Due to pain Overall  Cervical AROM Comments: L rotation decreased to approx 20 deg and painful, R rotation liminted to approx 30 deg, limited cervical extension and painful, maintains forward head posture but able to correct with cuing Thoracic Assessment Thoracic Assessment: Exceptions to [facility] (kyphotic) Lumbar Assessment Lumbar Assessment: Exceptions to [facility] Postural Control Postural Control: Deficits on evaluation Protective Responses: delayed  Balance Standardized Balance Assessment Standardized Balance Assessment: Timed Up and Go Test Timed Up and Go Test TUG: Normal TUG Normal TUG (seconds): 21.3 (avg of 3 trials using RW) Static Standing Balance Static Standing - Balance Support: During functional activity;Bilateral upper extremity supported Static Standing - Level of Assistance: 6: Modified independent (Device/Increase time) Dynamic Standing Balance Dynamic Standing - Balance Support: During functional activity Dynamic Standing - Level of Assistance: 5: Stand by assistance Extremity Assessment  RLE Assessment RLE Assessment: Within Functional Limits (grossly 4+/5 except hip flexion 4-/5) LLE Assessment LLE Assessment: Exceptions to [facility] LLE Strength LLE Overall Strength: Deficits LLE Overall Strength Comments: hip flexion and ankle DF/PF 3+/5, knee flex/ext grossly 4-/5  See FIM for current functional status  Carney Living A 11/10/2014, 8:00 AM

## 2014-11-10 NOTE — Progress Notes (Addendum)
[facility]     PROGRESS NOTE    Subjective/Complaints: Feeling better. Swelling down  Objective: Vital Signs: Blood pressure 103/59, pulse 82, temperature 98 F (36.7 C), temperature source Oral, resp. rate 18, height 5\' 10"  (1.778 m), weight 87.363 kg (192 lb 9.6 oz), SpO2 96 %. No results found.  Recent Labs  11/07/14 0900  WBC 7.6  HGB 11.9*  HCT 34.5*  PLT 72*    Recent Labs  11/08/14 0817 11/09/14 0543  NA 128* 130*  K 4.0 3.9  CL 93* 95*  GLUCOSE 137* 137*  BUN 8 9  CREATININE 0.61 0.66  CALCIUM 8.8* 8.3*   CBG (last 3)   Recent Labs  11/09/14 1614 11/09/14 2119 11/10/14 0656  GLUCAP 165* 146* 135*    Wt Readings from Last 3 Encounters:  11/10/14 87.363 kg (192 lb 9.6 oz)  06/13/14 88.905 kg (196 lb)  02/07/14 89.812 kg (198 lb)    Physical Exam:  Nursing note and vitals reviewed. Constitutional: He is oriented to person, place, and time. He appears well-developed and well-nourished.  HENT:  Head: Normocephalic and atraumatic.  Eyes: Conjunctivae are normal. Pupils are equal, round, and reactive to light.  Neck: Muscular tenderness present. Decreased range of motion present.     Cardiovascular: Normal rate and regular rhythm.  Respiratory: Effort normal and diminished basilar breath sounds . No respiratory distress. bilateral wheezes.  GI: Abdomen softer but sill with ongoing ascites/ distention. Bowel sounds are improved. There is no tenderness.  Musculoskeletal: He exhibits edema (1+ edema pedally left more than right). Minimal scrotal edema.  Neck pain decrased.  Neurological: He is alert and oriented to person, place, and time. Coordination abnormal.  Left inattention due to neck and shoulder pain limiting ROM.Marland Kitchen   speech clear and is able to follow basic commands without difficulty. RUE 4+/5. L- deltoid 2/5, 3- biceps, 3+triceps, intrinsics 4+/5. BLE 3+/5 with ataxia. No sensory loss to LT/P.  DTR's  1+  Skin: Skin is warm and dry.    Psychiatric: His speech is normal.  He's alert. Good spirits   Assessment/Plan: 1. Functional deficits secondary to cervical epidural abscess which require 3+ hours per day of interdisciplinary therapy in a comprehensive inpatient rehab setting. Physiatrist is providing close team supervision and 24 hour management of active medical problems listed below. Physiatrist and rehab team continue to assess barriers to discharge/monitor patient progress toward functional and medical goals.  FIM: FIM - Bathing Bathing Steps Patient Completed: Chest, Abdomen, Front perineal area, Buttocks, Right upper leg, Left upper leg, Left Arm, Left lower leg (including foot), Right lower leg (including foot), Right Arm Bathing: 5: Supervision: Safety issues/verbal cues  FIM - Upper Body Dressing/Undressing Upper body dressing/undressing steps patient completed: Thread/unthread right sleeve of pullover shirt/dresss, Put head through opening of pull over shirt/dress, Thread/unthread left sleeve of pullover shirt/dress, Pull shirt over trunk Upper body dressing/undressing: 5: Supervision: Safety issues/verbal cues FIM - Lower Body Dressing/Undressing Lower body dressing/undressing steps patient completed: Thread/unthread right underwear leg, Thread/unthread left underwear leg, Pull underwear up/down, Thread/unthread right pants leg, Thread/unthread left pants leg, Pull pants up/down Lower body dressing/undressing: 4: Min-Patient completed 75 plus % of tasks  FIM - Toileting Toileting steps completed by patient: Adjust clothing prior to toileting Toileting Assistive Devices: Grab bar or rail for support Toileting: 2: Max-Patient completed 1 of 3 steps  FIM - Radio producer Devices: Elevated toilet seat, Grab bars, Insurance account manager Transfers: 4-To toilet/BSC:  Min A (steadying Pt. > 75%)  FIM - Control and instrumentation engineer Devices:  Arm rests, Copy: 5: Supine > Sit: Supervision (verbal cues/safety issues), 5: Bed > Chair or W/C: Supervision (verbal cues/safety issues)  FIM - Locomotion: Wheelchair Locomotion: Wheelchair: 0: Activity did not occur FIM - Locomotion: Ambulation Locomotion: Ambulation Assistive Devices: Administrator Ambulation/Gait Assistance: 5: Supervision Locomotion: Ambulation: 5: Travels 150 ft or more with supervision/safety issues  Comprehension Comprehension Mode: Auditory Comprehension: 5-Understands complex 90% of the time/Cues < 10% of the time  Expression Expression Mode: Verbal Expression: 6-Expresses complex ideas: With extra time/assistive device  Social Interaction Social Interaction: 6-Interacts appropriately with others with medication or extra time (anti-anxiety, antidepressant).  Problem Solving Problem Solving: 5-Solves complex 90% of the time/cues < 10% of the time  Memory Memory: 6-More than reasonable amt of time  Medical Problem List and Plan: 1. 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 DVT Prophylaxis/Anticoagulation: Mechanical:  Antiembolism stockings, knee (TED hose) Bilateral lower extremities Sequential compression devices, below knee Bilateral lower extremities 3. Pain Management: will add low dose ms contin and observe for effect---may not be able to tolerate however.  -continue oxycodone 5mg  prn  .  -lidoderm, ice/heat, posture 4. Mood:   provide ego support.  5. Neuropsych: This patient is capable of making decisions on his own behalf. 6. Skin/Wound Care: Routine pressure relief measures. Maintain adequate nutritional support.  7. Fluids/Electrolytes/Nutrition: hyponatremia likely multifactorial, daily bmet for now  8. 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 Thrombocytopenia: Stable at 36 10. DM type 2: Hgb A1c- 8.7. Monitor BS with ac/hs checks and will use SSI for elevated BS.   -resumed Tradjenta as was on Invokana and Tradjenta at home.  -some improvement  11. Reactive leucocytosis: Slowly resolving with treatment of infection.  12. Ileus: resolved. Stools now loose from lactulose    13. Urine retention: foley would continue for now until diuretics decreased  -ucx negative     LOS (Days) 16 A FACE TO FACE EVALUATION WAS PERFORMED  SWARTZ,ZACHARY T 11/10/2014 7:38 AM

## 2014-11-10 NOTE — Progress Notes (Signed)
Occupational Therapy Note  Patient Details  Name: [name] MRN: [id number] Date of Birth: 10-09-1950  Today's Date: 11/10/2014 OT Individual Time: 1400-1430 OT Individual Time Calculation (min): 30 min   Pt denied pain Individual Therapy  Pt engaged in LUE therex to increased independence with BADLs and decrease burden of care.  Pt performed therex supine on therapy mat with focus on shoulder flexion and extension with 1kg and 2kg weight balls.     [name] [facility] 11/10/2014, 3:10 PM

## 2014-11-10 NOTE — Progress Notes (Addendum)
Occupational Therapy Session Note  Patient Details  Name: [name] MRN: [id number] Date of Birth: [birth year]  Today's Date: 11/10/2014 OT Individual Time: 0700-0800 OT Individual Time Calculation (min): 60 min    Short Term Goals: Week 2:  OT Short Term Goal 1 (Week 2): Pt will complete LB dressing with min assist OT Short Term Goal 2 (Week 2): Pt will complete toilet task with steadying assist for balance only OT Short Term Goal 3 (Week 2): Pt will complete toilet transfer at supervision level OT Short Term Goal 4 (Week 2): Pt will utilize RUE at gross assist level during self-care task with min cues  Skilled Therapeutic Interventions/Progress Updates:    Pt engaged in BADL retraining including bathing at shower level and dressing with sit<>stand from w/c.  Pt amb with RW into bathroom to complete bathing.  Pt uses AE to assist with bathing and dressing tasks.  Pt requires more than reasonable amount of time to complete all tasks.  Focus on activity tolerance, sit<>stand, standing balance, functional amb with RW, discharge planning, and safety awareness. Therapy Documentation Precautions:  Precautions Precautions: Fall Restrictions Weight Bearing Restrictions: No Pain: Pain Assessment Pain Score: 4  Pain Type: Acute pain Pain Location: Neck Pain Orientation: Left Pain Descriptors / Indicators: Aching Pain Onset: Gradual  See FIM for current functional status  Therapy/Group: Individual Therapy  Leroy Libman 11/10/2014, 9:04 AM

## 2014-11-10 NOTE — Discharge Summary (Signed)
Physician Discharge Summary  Patient ID: [name] MRN: [id number] DOB/AGE: [birth year] 64 y.o.  Admit date: 10/25/2014 Discharge date: 11/11/2014  Discharge Diagnoses:  Principal Problem:   Abscess in epidural space of cervical spine Active Problems:   Encephalopathy acute   Left cervical radiculopathy   Cervical myelopathy   Ileus, postoperative   Ascites   Discharged Condition: Stable   Significant Diagnostic Studies: Dg Chest 2 View  11/05/2014   CLINICAL DATA:  Productive cough. Wheezing. Lower extremity swelling.  EXAM: CHEST  2 VIEW  COMPARISON:  10/21/2014  FINDINGS: Low lung volumes are again seen with bibasilar atelectasis. No evidence of pulmonary consolidation or pleural effusion. Heart size is normal.  New right arm PICC line is seen with tip overlying expected location of superior cavoatrial junction.  IMPRESSION: Right arm PICC line in appropriate position.  Low lung volumes and bibasilar atelectasis, without significant change.   Electronically Signed   By: Earle Gell M.D.   On: 11/05/2014 11:06    Dg Abd 1 View  11/05/2014   CLINICAL DATA:  Constipation.  Lower extremity swelling.  EXAM: ABDOMEN - 1 VIEW  COMPARISON:  10/28/2014  FINDINGS: Increase in multiple dilated small bowel loops seen since previous study, with suspected bowel wall thickening noted in right abdomen. There is a relative paucity of colonic gas. A partial small bowel obstruction cannot be excluded. Calcified gallstone again noted in the right upper quadrant.  IMPRESSION: Increased small bowel dilatation with suspected bowel wall thickening in the right abdomen. Partial small bowel obstruction cannot be excluded. Consider abdomen pelvis CT with contrast for further evaluation.  Cholelithiasis.   Electronically Signed   By: Earle Gell M.D.   On: 11/05/2014 11:13    Dg Abd 1 View  10/25/2014   CLINICAL DATA:  64 year old male with a history of abdominal and back pain  EXAM: ABDOMEN - 1 VIEW   COMPARISON:  CT abdomen 02/22/2007  FINDINGS: Single frontal view of the lower abdomen demonstrates multiple air filled small bowel loops. Single loop in the left lower quadrant is borderline distended.  Gas within distal descending colon and sigmoid colon.  No abnormal calcifications identified  No displaced fracture.  IMPRESSION: Multiple air filled small bowel loops, without evidence of obstruction as gas extends through the sigmoid colon to the rectum. Findings can be seen in enteritis, or potentially an intermittent obstruction. If there is concern for acute abdominal process, CT may be useful.  Signed,  Dulcy Fanny. Earleen Newport, DO  [facility]  [facility]   Electronically Signed   By: Corrie Mckusick D.O.   On: 10/25/2014 21:29    US Abdomen Limited  10/30/2014   CLINICAL DATA:  Evaluate for ascites. Cirrhosis. Abdominal distention  EXAM: LIMITED ABDOMEN ULTRASOUND FOR ASCITES  TECHNIQUE: Limited ultrasound survey for ascites was performed in all four abdominal quadrants.  COMPARISON:  None.  FINDINGS: Ascites is present in all 4 quadrants of the abdomen.  IMPRESSION: Ascites is present.   Electronically Signed   By: Rolla Flatten M.D.   On: 10/30/2014 13:10   US Paracentesis  10/31/2014   INDICATION: Cirrhosis, recurrent ascites and request for paracentesis.  EXAM: ULTRASOUND-GUIDED PARACENTESIS  COMPARISON:  [place] Abd limited 10/30/14.  MEDICATIONS: None.  COMPLICATIONS: None immediate  TECHNIQUE: Informed written consent was obtained from the patient after a discussion of the risks, benefits and alternatives to treatment. A timeout was performed prior to the initiation of the procedure.  Initial ultrasound  scanning demonstrates a large amount of ascites within the left lower abdominal quadrant. The left lower abdomen was prepped and draped in the usual sterile fashion. 1% lidocaine was used for local anesthesia.  An ultrasound image was saved for documentation  purposed. A 6 Fr Safe-T-Centesis catheter was introduced. The paracentesis was performed. The catheter was removed and a dressing was applied. The patient tolerated the procedure well without immediate post procedural complication.  FINDINGS: A total of maximum requested 3 liters of serous fluid was removed. Samples were sent to the laboratory as requested by the clinical team.  IMPRESSION: Successful ultrasound-guided paracentesis yielding 3 liters of peritoneal fluid.  Read By:  Tsosie Billing PA-C   Electronically Signed   By: Corrie Mckusick D.O.   On: 10/31/2014 12:55   Dg Abd Portable 1v  10/27/2014   CLINICAL DATA:  Generalized abdominal pain and distention.  EXAM: PORTABLE ABDOMEN - 1 VIEW  COMPARISON:  October 26, 2014.  FINDINGS: No bowel dilatation is noted. Several air-filled loops of small bowel are seen in right side of the abdomen which is stable compared to prior exam. No radio-opaque calculi or other significant radiographic abnormality are seen.  IMPRESSION: No evidence of bowel obstruction or significant ileus.   Electronically Signed   By: Marijo Conception, M.D.   On: 10/27/2014 14:55   Dg Abd Portable 1v  10/26/2014   CLINICAL DATA:  Ileus versus small-bowel obstruction.  EXAM: PORTABLE ABDOMEN - 1 VIEW  COMPARISON:  10/25/2014  FINDINGS: Continued evidence of air-filled nondilated loops of large and small bowel. Less air-filled small bowel loops compared to the previous exam. Air is present throughout the entire colon. No free peritoneal air noted. Remainder the exam is unchanged.  IMPRESSION: Interval decrease in number of air-filled nondilated small bowel loops. No evidence of obstruction.   Electronically Signed   By: Marin Olp M.D.   On: 10/26/2014 14:21    Labs:  Basic Metabolic Panel:  Recent Labs Lab 11/04/14 0620 11/05/14 0634 11/06/14 0425 11/07/14 0900 11/08/14 0817 11/09/14 0543 11/10/14 0445  NA 130* 126* 129* 129* 128* 130* 131*  K 4.3 4.4 4.1 3.9 4.0 3.9 3.9   CL 96 94* 96* 94* 93* 95* 95*  CO2 28 27 28 27 26 30 29   GLUCOSE 211* 165* 153* 152* 137* 137* 155*  BUN 12 12 9 9 8 9 9   CREATININE 0.56 0.59* 0.54* 0.58* 0.61 0.66 0.61  CALCIUM 8.4 8.3* 8.5* 8.7* 8.8* 8.3* 8.5*    CBC:  Recent Labs Lab 11/04/14 0620 11/07/14 0900  WBC 6.3 7.6  HGB 10.9* 11.9*  HCT 32.5* 34.5*  MCV 92.6 90.6  PLT 36* 72*    CBG:  Recent Labs Lab 11/09/14 1614 11/09/14 2119 11/10/14 0656 11/10/14 1137 11/10/14 1627  GLUCAP 165* 146* 135* 142* 127*    Brief HPI:   [name] is a 64 year old male with history of hepatic cirrhosis  due to NASH, DM type 2 with neuropathy; who was has had neck pain with malaise X 2 weeks and positive blood cultures drawn by MD.  He was instructed to [facility] to [facility] ED on 10/21/14 due to concerns of spine infection and was transferred to [facility] for evaluation.  Patient was somnolent and encephalopathic with left sided neglect and quadriparesis at admission.  MRI cervical spine revealed epidural abscess with enhancing phlegmonous change from C1-C 6-7 with moderate to severe diffuse canal stenosis with flattening and displacement of cord,  multifocal abscesses involving posterior paraspinous musculature predominantly on the left and abnormal dural enhancement extending superiorly through the foramen magnum. CT head negative for acute changes.    Dr. Saintclair Halsted was consulted for input and recommended management with IV antibiotics as patient was extremelyhigh surgical risk due to multiple comorbidities as well as coagulopathy. Repeat blood cultures were positive for Group B streptococci and patient is to continue on IV ceftriaxone 2 grams bid while hospitalized. To change to 2 grams daily at discharge thorough June 9th for 8 weeks antibiotic therapy per Dr. Linus Salmons. Mentation was improving with decrease in left inattention with improvement in left sided weakness. Therapy was ongoing and patient with resultant unsteady gait, fatigue and  diffuse weakness with poor coordination. CIR was recommended for follow up therapy.    Hospital Course: [name] was admitted to rehab 10/25/2014 for inpatient therapies to consist of PT, ST and OT at least three hours five days a week. Past admission physiatrist, therapy team and rehab RN have worked together to provide customized collaborative inpatient rehab. Patient was noted to have abdominal distension with reports of constipation as well as severe pain left shoulder and left neck.  He was also noted to have bouts of lethargy with poor po intake. He was found to have ileus and was started on bowel program with positive results but continued to have increased abdominal distension with scrotal and BLE edema.  BLE dopplers were negative for DVTs.  Dr. Elwyn Reach was consulted for input and recommended diuretics to help with peripheral edema. Lactulose was added to help with elevated ammonia levels as well as constipation.  Abdominal U/S revealed ascites in all four quadrants and patient underwent paracentesis of 3 liters of peritoneal fluid on 04/26.  Fluid culture showed no growth.   Patient developed problems voiding with urinary retention requiring in and out catheterizations.  Indwelling foley was placed on 05/01 due to retention as well as increase in diuretics to help with treatment of edema. BLE edema is improving and weight is down to 87.4 kg at discharge. Lytes were monitored with serial checks. Hyponatremia is improving and renal status is stable.  He is to continue on lasix 80 mg with aldactone 200 mg daily per GI recommendations. Prealbumin levels are low at 4.6 and protein supplements were added to help with malnutrition. Thrombocytopenia is improving with rise in platelets to 72. Diabetes has been monitored with ac/hs checks. Amaryl was added for tighter control and has been titrated to 8 mg daily.  Patient's po intake has improved and lethargy has resolved. Mentation has improved and  cognition is back to baseline.  He has had improvement in ROM of neck with increase in LUE use. He has made good gains during his rehab stay and is at supervision to min assist level.  He will continue to receive follow up [place] [place] and [place] [facility] past discharge.    Rehab course: During patient's stay in rehab weekly team conferences were held to monitor patient's progress, set goals and discuss barriers to discharge. At admission, patient required min to moderate assistance with ADLs and min assist with mobility. Cognitive evaluation revealed difficulty with mildly complex problem solving due in large part to decreased working memory. He  has had improvement in activity tolerance, balance, as well as improvement in functional use LUE  and LLE with improvement in attention, awareness and coordination.  He is currently ambulating at supervision level with use of walker. He is able  to complete ADL tasks with use of AE and more than reasonable amount of time. He is modified independent for problem solving and requires supervisory cues for mildly complex tasks.  Wife and patient felt that cognition was at baseline and ST signed off on 05/04. Family education was done with wife regarding all aspects of care and need for supervision.     Disposition:  Home   Diet: Diabetic diet.   Special Instructions: 1. Check your blood sugars three times a day before meals and record.  2. Discuss with urology consult with MD about voiding trial once ready to have foley removed/ diuretics decreased.       Medication List    STOP taking these medications        canagliflozin 100 MG Tabs tablet  Commonly known as:  INVOKANA     doxycycline 100 MG tablet  Commonly known as:  VIBRA-TABS     Dulaglutide 0.75 MG/0.5ML Sopn     gabapentin 100 MG capsule  Commonly known as:  NEURONTIN     INVOKANA 300 MG Tabs tablet  Generic drug:  canagliflozin     JANUVIA 100 MG tablet  Generic drug:   sitaGLIPtin     LIVER EXTRACT PO     Magnesium 250 MG Tabs     milk thistle 175 MG tablet     Potassium 99 MG Tabs      TAKE these medications        acetaminophen 325 MG tablet  Commonly known as:  TYLENOL  Take 1-2 tablets (325-650 mg total) by mouth every 4 (four) hours as needed for mild pain.     cefTRIAXone 40 MG/ML IVPB  Commonly known as:  ROCEPHIN  Inject 50 mLs (2 g total) into the vein daily.     cetirizine 10 MG tablet  Commonly known as:  ZYRTEC  Take 10 mg by mouth daily as needed for allergies.     feeding supplement (PRO-STAT SUGAR FREE 64) Liqd  Take 30 mLs by mouth 3 (three) times daily.     furosemide 80 MG tablet  Commonly known as:  LASIX  Take 1 tablet (80 mg total) by mouth daily.     glimepiride 4 MG tablet  Commonly known as:  AMARYL  Take 2 tablets (8 mg total) by mouth daily with breakfast.  Start taking on:  11/11/2014     lactulose 10 GM/15ML solution  Commonly known as:  CHRONULAC  Take 30 mLs (20 g total) by mouth 2 (two) times daily.     lidocaine 5 %  Commonly known as:  LIDODERM  Apply to left neck at 8 am and remove at 8 pm daily.  Notes to Patient:  Insurance will likely not cover this medication. You can try icy/hot other over the counter patches if not covered.      linagliptin 5 MG Tabs tablet  Commonly known as:  TRADJENTA  Take 5 mg by mouth every morning.     methocarbamol 500 MG tablet--Rx 90/1 refill  Commonly known as:  ROBAXIN  Take 1 tablet (500 mg total) by mouth every 6 (six) hours as needed for muscle spasms.     morphine 15 MG 12 hr tablet--Rx #60 pills   Commonly known as:  MS CONTIN  Take 1 tablet (15 mg total) by mouth every 12 (twelve) hours.     oxyCODONE 5 MG immediate release tablet--Rx # 90 pills   Commonly known as:  Oxy IR/ROXICODONE  Take  1 tablet (5 mg total) by mouth every 4 (four) hours as needed for severe pain.     pantoprazole 40 MG tablet  Commonly known as:  PROTONIX  Take 1 tablet (40  mg total) by mouth daily.     propranolol 20 MG tablet  Commonly known as:  INDERAL  TAKE 1 TABLET (20 MG TOTAL) BY MOUTH DAILY.     senna-docusate 8.6-50 MG per tablet  Commonly known as:  Senokot-S  Take 2 tablets by mouth at bedtime. If no BM in 24 hours     simethicone 80 MG chewable tablet  Commonly known as:  MYLICON  Chew 2 tablets (160 mg total) by mouth 4 (four) times daily as needed for flatulence. For gas/bloating     spironolactone 100 MG tablet  Commonly known as:  ALDACTONE  Take 2 tablets (200 mg total) by mouth daily.     Vitamin D (Ergocalciferol) 50000 UNITS Caps capsule  Commonly known as:  DRISDOL  Take 1 capsule (50,000 Units total) by mouth every 7 (seven) days.       Follow-up Information    Follow up with Meredith Staggers, MD On 01/15/2015.   Specialty:  Physical Medicine and Rehabilitation   Why:  Be there at 10:30 am  for 11 am appointment   Contact information:   [place]. [address] [place] [address] [phone]       Follow up with Elaina Hoops, MD. Call today.   Specialty:  Neurosurgery   Why:  for follow up appointment   Contact information:   [address] [place] [address] [place] [address] [phone]       Follow up with Alcide Evener, MD On 12/05/2014.   Specialty:  Infectious Diseases   Why:  Be there at 1:50 pm for 2 pm   Contact information:   [address] [place] [address] [phone]       Follow up with Nicoletta Ba, PA-C On 11/28/2014.   Specialty:  Gastroenterology   Why:  Be there at 8:30 am for follow up on GI issues   Contact information:   [place] [address] [phone]       Follow up with [name], MD On 11/13/2014.   Specialty:  Family Medicine   Why:  @ 11:15 am   Contact information:   [address] [place] [address] [phone]       Signed: Bary Leriche 11/10/2014, 5:22 PM

## 2014-11-10 NOTE — Progress Notes (Signed)
Occupational Therapy Discharge Summary  Patient Details  Name: [name] MRN: [id number] Date of Birth: 02/17/1951   Patient has met 7 of 7 long term goals due to improved activity tolerance, improved balance, postural control, ability to compensate for deficits, functional use of  LEFT upper and LEFT lower extremity, improved attention, improved awareness and improved coordination.  Pt made steady progress during this admission.  Pt is supervision for all BADLs.  Pt continues to require min verbal cues for safety awareness.  Pt requires more than reasonable amount of time to complete all tasks.  Pt uses LUE at diminished level for all functional tasks.  Pt's wife has been present for therapy sessions. Patient to discharge at overall Supervision level.  Patient's care partner is independent to provide the necessary physical and cognitive assistance at discharge.      Recommendation:  Patient will benefit from ongoing skilled OT services in home health setting to continue to advance functional skills in the area of BADLs, balance, functional use of LUE, strengthening, activity tolerance, and minimize fall risk.  Equipment: BSC  Reasons for discharge: treatment goals met and discharge from hospital  Patient/family agrees with progress made and goals achieved: Yes  OT Discharge   Vision/Perception  Vision- History Baseline Vision/History: Wears glasses Wears Glasses: At all times Patient Visual Report: No change from baseline Vision- Assessment Vision Assessment?: No apparent visual deficits  Cognition Overall Cognitive Status: History of cognitive impairments - at baseline Arousal/Alertness: Awake/alert Orientation Level: Oriented X4 Attention: Alternating Focused Attention: Appears intact Sustained Attention: Appears intact Alternating Attention: Appears intact Memory: Impaired Memory Impairment: Decreased recall of new information Awareness: Appears intact Problem  Solving: Impaired Self Correcting: Appears intact Self Correcting Impairment: Functional basic Safety/Judgment: Appears intact Sensation Sensation Light Touch: Impaired Detail Light Touch Impaired Details: Impaired LLE Stereognosis: Not tested Hot/Cold: Appears Intact Proprioception: Appears Intact Coordination Gross Motor Movements are Fluid and Coordinated: No Fine Motor Movements are Fluid and Coordinated: No Motor  Motor Motor: Abnormal postural alignment and control;Hemiplegia;Abnormal tone Trunk/Postural Assessment  Cervical Assessment Cervical Assessment: Exceptions to [facility] (forward head) Cervical AROM Overall Cervical AROM: Deficits;Due to pain Overall Cervical AROM Comments: L rotation decreased to approx 20 deg and painful, R rotation liminted to approx 30 deg, limited cervical extension and painful, maintains forward head posture but able to correct with cuing Thoracic Assessment Thoracic Assessment: Exceptions to [facility] (kyphotic) Lumbar Assessment Lumbar Assessment: Exceptions to [facility]  Balance Dynamic Sitting Balance Dynamic Sitting - Balance Support: Feet supported;Right upper extremity supported Dynamic Sitting - Level of Assistance: 5: Stand by assistance Extremity/Trunk Assessment RUE Assessment RUE Assessment: Within Functional Limits LUE Assessment LUE Assessment: Exceptions to WFL LUE AROM (degrees) LUE Overall AROM Comments: shoulder flexion grossly 45 degress, elbow flexion grossly 110 degrees, elbow extension WFL LUE Strength LUE Overall Strength: Deficits LUE Overall Strength Comments: PROM WFL's; shoulder flexion grossly 3+/5; elbow flexion 3+/5; elbow extension 3+/5; grip fair  See FIM for current functional status  Leroy Libman 11/10/2014, 3:16 PM

## 2014-11-10 NOTE — Progress Notes (Signed)
Occupational Therapy Session Note  Patient Details  Name: [name] MRN: [id number] Date of Birth: [birth year]  Today's Date: 11/10/2014 OT Individual Time: 1300-1400 OT Individual Time Calculation (min): 60 min    Short Term Goals: Week 1:  OT Short Term Goal 1 (Week 1): Pt will complete LB dressing at supervision level OT Short Term Goal 1 - Progress (Week 1): Not met OT Short Term Goal 2 (Week 1): Pt will complete toilet transfer at supervision level OT Short Term Goal 2 - Progress (Week 1): Not met OT Short Term Goal 3 (Week 1): Pt will complete functional activity in standing for 3 min to increase activity tolerance OT Short Term Goal 3 - Progress (Week 1): Met   Week 2:  OT Short Term Goal 1 (Week 2): Pt will complete LB dressing with min assist OT Short Term Goal 2 (Week 2): Pt will complete toilet task with steadying assist for balance only OT Short Term Goal 3 (Week 2): Pt will complete toilet transfer at supervision level OT Short Term Goal 4 (Week 2): Pt will utilize LUE at gross assist level during self-care task with min cues  Skilled Therapeutic Interventions/Progress Updates:  Patient with 3/10 complaints of pain in neck and across shoulders, patient stated he recently received pain medication prior to this therapy session. Patient found seated EOB eating lunch with no distress or problems noted with wife present at bedside. Patient commented, "I've made a lot of progress so far". Educated patient on sit<>stand technique using LUE for weightbearing. Patient then stood with RW, powering up with LUE. Patient ambulated from room > ADL apartment with min guard > supervision assistance. Patient sat on couch for seated rest break, then ambulated > BR for tub/shower transfer using tub transfer bench with supervision. Side stepping with RW, simulated like at home. Patient then ambulated > therapy gym and took ~5 minute rest break. From here focused skilled intervention on fine motor  and gross motor coordination un stacking "speed stack cups", weightbearing through LUE while stacking and un stacking cups. Patient then engaged in shoulder flexion AROM using UE ranger. Administered theraputty for hand strengthening and encouraged this as a HEP. During these activities, therapist focused on stabilization and NMR of LUE. Patient with no increased or complaints of pain during or after activities. At end of session, left patient in therapy gym with wife waiting on next therapist.   Precautions:  Precautions Precautions: Fall Restrictions Weight Bearing Restrictions: No  See FIM for current functional status  Therapy/Group: Individual Therapy  Samhitha Rosen , MS, OTR/L, CLT  11/10/2014, 2:00 PM

## 2014-11-11 ENCOUNTER — Other Ambulatory Visit: Payer: Self-pay | Admitting: Internal Medicine

## 2014-11-11 LAB — BASIC METABOLIC PANEL
Anion gap: 7 (ref 5–15)
BUN: 10 mg/dL (ref 6–20)
CALCIUM: 8.3 mg/dL — AB (ref 8.9–10.3)
CHLORIDE: 92 mmol/L — AB (ref 101–111)
CO2: 29 mmol/L (ref 22–32)
Creatinine, Ser: 0.6 mg/dL — ABNORMAL LOW (ref 0.61–1.24)
GFR calc Af Amer: >60 mL/min (ref >60)
Glucose, Bld: 137 mg/dL — ABNORMAL HIGH (ref 70–99)
Potassium: 3.7 mmol/L (ref 3.5–5.1)
Sodium: 128 mmol/L — ABNORMAL LOW (ref 135–145)

## 2014-11-11 LAB — GLUCOSE, CAPILLARY: GLUCOSE-CAPILLARY: 128 mg/dL — AB (ref 70–99)

## 2014-11-11 MED ORDER — LINAGLIPTIN 5 MG PO TABS: 5.0000 mg | ORAL_TABLET | Freq: Every morning | ORAL | Status: DC

## 2014-11-11 MED ORDER — HEPARIN SOD (PORK) LOCK FLUSH 100 UNIT/ML IV SOLN
250.0000 [IU] | INTRAVENOUS | Status: AC | PRN
  Administered 2014-11-11: 250 [IU]

## 2014-11-11 NOTE — Progress Notes (Signed)
1120 Patient discharge to home with wife.  PICC line capped and flushed for home use.  All discharge instructions provided by Algis Liming, PA via handouts.  No further questions asked.  All personal items in tow.  Escorted to vehicle by NT.

## 2014-11-11 NOTE — Progress Notes (Signed)
[place] PHYSICAL MEDICINE & REHABILITATION     PROGRESS NOTE    Subjective/Complaints: Slept ok, no new issues today Received D/C instructions and rx yest pm  Objective: Vital Signs: Blood pressure 104/69, pulse 66, temperature 97.9 F (36.6 C), temperature source Oral, resp. rate 18, height 5\' 10"  (1.778 m), weight 85.14 kg (187 lb 11.2 oz), SpO2 99 %. No results found. No results for input(s): WBC, HGB, HCT, PLT in the last 72 hours.  Recent Labs  11/10/14 0445 11/11/14 0417  NA 131* 128*  K 3.9 3.7  CL 95* 92*  GLUCOSE 155* 137*  BUN 9 10  CREATININE 0.61 0.60*  CALCIUM 8.5* 8.3*   CBG (last 3)   Recent Labs  11/10/14 1627 11/10/14 2126 11/11/14 0655  GLUCAP 127* 161* 128*    Wt Readings from Last 3 Encounters:  11/11/14 85.14 kg (187 lb 11.2 oz)  06/13/14 88.905 kg (196 lb)  02/07/14 89.812 kg (198 lb)    Physical Exam:  Nursing note and vitals reviewed. Constitutional: He is oriented to person, place, and time. He appears well-developed and well-nourished.  HENT:  Head: Normocephalic and atraumatic.  Eyes: Conjunctivae are normal. Pupils are equal, round, and reactive to light.  Neck: Muscular tenderness present. Decreased range of motion present.     Cardiovascular: Normal rate and regular rhythm.  Respiratory: Effort normal and diminished basilar breath sounds . No respiratory distress. bilateral wheezes.  GI: Abdomen softer but sill with ongoing ascites/ distention. Bowel sounds are improved. There is no tenderness.  Musculoskeletal: He exhibits edema (1+ edema pedally left more than right). Minimal scrotal edema.   Neurological: He is alert and oriented to person, place, and time. Coordination abnormal.   commands without difficulty. RUE 4+/5. L- deltoid 2/5, 3- biceps, 3+triceps, intrinsics 4+/5. BLE 3+/5 with ataxia. No sensory loss to LT/P.  DTR's 1+  Skin: Skin is warm and dry.    Psychiatric: His speech is normal.  He's alert. Good  spirits   Assessment/Plan: 1. Functional deficits secondary to cervical epidural abscess  Stable for D/C today F/u PCP in 1-2 weeks F/u PM&R 8 weeks F/u NS F/u ID F/u Uro voiding trial See D/C summary See D/C instructions FIM: FIM - Bathing Bathing Steps Patient Completed: Chest, Abdomen, Front perineal area, Buttocks, Right upper leg, Left upper leg, Left Arm, Left lower leg (including foot), Right lower leg (including foot), Right Arm Bathing: 5: Supervision: Safety issues/verbal cues  FIM - Upper Body Dressing/Undressing Upper body dressing/undressing steps patient completed: Thread/unthread right sleeve of pullover shirt/dresss, Put head through opening of pull over shirt/dress, Thread/unthread left sleeve of pullover shirt/dress, Pull shirt over trunk Upper body dressing/undressing: 5: Supervision: Safety issues/verbal cues FIM - Lower Body Dressing/Undressing Lower body dressing/undressing steps patient completed: Thread/unthread right underwear leg, Thread/unthread left underwear leg, Pull underwear up/down, Thread/unthread right pants leg, Thread/unthread left pants leg, Pull pants up/down Lower body dressing/undressing: 4: Min-Patient completed 75 plus % of tasks  FIM - Toileting Toileting steps completed by patient: Adjust clothing prior to toileting, Performs perineal hygiene, Adjust clothing after toileting Toileting Assistive Devices: Grab bar or rail for support Toileting: 5: Supervision: Safety issues/verbal cues  FIM - Radio producer Devices: Elevated toilet seat, Grab bars, Insurance account manager Transfers: 5-To toilet/BSC: Supervision (verbal cues/safety issues), 5-From toilet/BSC: Supervision (verbal cues/safety issues)  FIM - Control and instrumentation engineer Devices: Arm rests, Copy: 5: Supine > Sit: Supervision (verbal cues/safety issues), 5: Bed > Chair  or W/C: Supervision (verbal cues/safety issues), 5:  Sit > Supine: Supervision (verbal cues/safety issues), 5: Chair or W/C > Bed: Supervision (verbal cues/safety issues)  FIM - Locomotion: Wheelchair Locomotion: Wheelchair: 0: Activity did not occur FIM - Locomotion: Ambulation Locomotion: Ambulation Assistive Devices: Administrator Ambulation/Gait Assistance: 5: Supervision Locomotion: Ambulation: 5: Travels 150 ft or more with supervision/safety issues  Comprehension Comprehension Mode: Auditory Comprehension: 5-Understands complex 90% of the time/Cues < 10% of the time  Expression Expression Mode: Verbal Expression: 6-Expresses complex ideas: With extra time/assistive device  Social Interaction Social Interaction: 6-Interacts appropriately with others with medication or extra time (anti-anxiety, antidepressant).  Problem Solving Problem Solving: 5-Solves complex 90% of the time/cues < 10% of the time  Memory Memory: 6-More than reasonable amt of time  Medical Problem List and Plan: 1. 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 DVT Prophylaxis/Anticoagulation: Mechanical:  Antiembolism stockings, knee (TED hose) Bilateral lower extremities Sequential compression devices, below knee Bilateral lower extremities 3. Pain Management: will add low dose ms contin and observe for effect---may not be able to tolerate however.  -continue oxycodone 5mg  prn  .  -lidoderm, ice/heat, posture 4. Mood:   provide ego support.  5. Neuropsych: This patient is capable of making decisions on his own behalf. 6. Skin/Wound Care: Routine pressure relief measures. Maintain adequate nutritional support.  7. Fluids/Electrolytes/Nutrition: hyponatremia likely multifactorial, daily bmet for now  8. 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 Thrombocytopenia: Stable at 36 10. DM type 2:  Hgb A1c- 8.7. Monitor BS with ac/hs checks and will use SSI for elevated BS.   -resumed Tradjenta as was on Invokana and Tradjenta at home.  -some improvement  11. Reactive leucocytosis: Slowly resolving with treatment of infection.  12. Ileus: resolved. Stools now loose from lactulose    13. Urine retention: foley would continue for now until diuretics decreased-outpt f/u uro -ucx negative     LOS (Days) 17 A FACE TO FACE EVALUATION WAS PERFORMED  Alysia Penna E 11/11/2014 7:55 AM

## 2014-11-13 ENCOUNTER — Ambulatory Visit: Payer: Managed Care, Other (non HMO) | Admitting: Endocrinology

## 2014-11-14 NOTE — Progress Notes (Addendum)
Social Work  Discharge Note  The overall goal for the admission was met for:   Discharge location: Yes - home with wife to provide 24/7 supervision  Length of Stay: Yes - 17 days  Discharge activity level: Yes - supervision overall  Home/community participation: Yes  Services provided included: MD, RD, PT, OT, SLP, RN, TR, Pharmacy, [place]: Private Insurance: [employer]  Follow-up services arranged: Home Health: RN, PT, OT via [place], DME: rolling walker and IV abx via [place] and Patient/Family has no preference for HH/DME agencies  Comments (or additional information):  Patient/Family verbalized understanding of follow-up arrangements: Yes  Individual responsible for coordination of the follow-up plan: pt/ wife  Confirmed correct DME delivered: Lennart Pall 11/14/2014    Ladeidra Borys

## 2014-11-17 ENCOUNTER — Telehealth: Payer: Self-pay | Admitting: *Deleted

## 2014-11-17 NOTE — Telephone Encounter (Signed)
Antony Haste PT form [facility] called wanting to get a verbal ok that he handle the OT orders since the OT for that area Tia Alert) is out on maternity leave and it would be difficult getting replaced on short notice.  The number I wrote down for Antony Haste is wrong and I will have to notify [facility] on Monday

## 2014-11-20 ENCOUNTER — Telehealth: Payer: Self-pay | Admitting: *Deleted

## 2014-11-20 NOTE — Telephone Encounter (Signed)
I was able to locate [facility] through [facility].  I spoke with Antony Haste PT in reference to last message ( I accidentally closed it) to let him know that he can do the PT/OT since no OT readily availabel due to maternity leave.

## 2014-11-21 ENCOUNTER — Telehealth: Payer: Self-pay | Admitting: *Deleted

## 2014-11-21 NOTE — Telephone Encounter (Signed)
Received signed release from patient's employer requesting office notes for determination of short term disability.  Patient was hospitalized April 2016, will follow up with Dr. Tommy Medal on 5/31.  Paperwork placed in [name] Dam's box for review. Landis Gandy, RN

## 2014-11-28 ENCOUNTER — Other Ambulatory Visit (INDEPENDENT_AMBULATORY_CARE_PROVIDER_SITE_OTHER): Payer: Managed Care, Other (non HMO)

## 2014-11-28 ENCOUNTER — Ambulatory Visit (INDEPENDENT_AMBULATORY_CARE_PROVIDER_SITE_OTHER): Payer: Managed Care, Other (non HMO) | Admitting: Physician Assistant

## 2014-11-28 ENCOUNTER — Encounter: Payer: Self-pay | Admitting: Physician Assistant

## 2014-11-28 VITALS — BP 96/60 | HR 76 | Ht 70.0 in | Wt 153.4 lb

## 2014-11-28 DIAGNOSIS — R188 Other ascites: Secondary | ICD-10-CM

## 2014-11-28 DIAGNOSIS — K7460 Unspecified cirrhosis of liver: Secondary | ICD-10-CM

## 2014-11-28 LAB — COMPREHENSIVE METABOLIC PANEL
ALT: 11 U/L (ref 0–53)
AST: 18 U/L (ref 0–37)
Albumin: 2.9 g/dL — ABNORMAL LOW (ref 3.5–5.2)
Alkaline Phosphatase: 80 U/L (ref 39–117)
BUN: 23 mg/dL (ref 6–23)
CHLORIDE: 93 meq/L — AB (ref 96–112)
CO2: 31 meq/L (ref 19–32)
CREATININE: 1.07 mg/dL (ref 0.40–1.50)
Calcium: 9.5 mg/dL (ref 8.4–10.5)
GFR: 74.37 mL/min (ref >60.00)
GLUCOSE: 382 mg/dL — AB (ref 70–99)
POTASSIUM: 4.4 meq/L (ref 3.5–5.1)
Sodium: 130 mEq/L — ABNORMAL LOW (ref 135–145)
TOTAL PROTEIN: 7.9 g/dL (ref 6.0–8.3)
Total Bilirubin: 1 mg/dL (ref 0.2–1.2)

## 2014-11-28 LAB — PROTIME-INR
INR: 1.4 ratio — AB (ref 0.8–1.0)
Prothrombin Time: 15.6 s — ABNORMAL HIGH (ref 9.6–13.1)

## 2014-11-28 LAB — AMMONIA: Ammonia: 34 umol/L (ref 11–35)

## 2014-11-28 NOTE — Patient Instructions (Addendum)
We will call you with the lab results.  Take Miralax 1-2 doses daily. ( dose is 17 grams in 8 0z of water).  Stop the Aldactone for now. Stop the Lasix for now. Continue the Chronulac, 30 cc's twice daily.  We made you a follow up appointment with Dr. Scarlette Shorts on 01-03-2015 at 8:30 am.

## 2014-11-28 NOTE — Progress Notes (Signed)
Agree 

## 2014-11-28 NOTE — Progress Notes (Signed)
Patient ID: [name], male   DOB: [birth year], 64 y.o.   MRN: [id number]   Subjective:    Patient ID: [name], male    DOB: 21-Aug-1950, 64 y.o.   MRN: [id number]  HPI [name] is a pleasant 64 year old white male known to [name] who has diagnosis of cirrhosis felt probably mash induced of onset diabetes mellitus, hypertension, history of colon polyps, history of previous esophageal variceal hemorrhage. Patient recently had a prolonged hospitalization in April 06 Nov 2014. He was admitted with a strep bacteremia and found to have an epidural abscess extending from C1-C6 7. This was managed medically and he is being followed by Dr. Saintclair Halsted. He has been on IV antibiotics and currently has a PICC line in continuing daily Rocephin at home. He has follow-up with neurosurgery later this week and also infectious disease. During his hospitalization he was found to be volume overloaded with ascites and peripheral edema and was discharged home on fairly high-dose diuretics with Lasix 80 mg daily and Aldactone 200 mg daily. He has lost almost 50 pounds since the beginning of May visit he actually stopped his diuretics himself yesterday. He says overall he is feeling better has no residual back pain that is generally weak. He developed some symptoms consistent with neurogenic bladder and extremity weakness on the left side associated with the epidural abscess. His having difficulty ambulating and using a walker. He has no current complaints of fevers or sweats does have cold spells. No complaints of abdominal pain, continues to have chronic problems with constipation. No melena or hematochezia. His wife says he has not had any problems with confusion that he has had been weak. He has been using lactulose 30 mL twice daily for the constipation. Last EGD was done in December 2015 was distal esophageal scarring and small mid esophageal varices no banding required also had moderate portal gastropathy. Plan was for  follow-up in 1 year.  Review of Systems Pertinent positive and negative review of systems were noted in the above HPI section.  All other review of systems was otherwise negative.  Outpatient Encounter Prescriptions as of 11/28/2014  Medication Sig  . canagliflozin (INVOKANA) 300 MG TABS tablet Take 300 mg by mouth daily before breakfast.  . cefTRIAXone (ROCEPHIN) 40 MG/ML IVPB Inject 50 mLs (2 g total) into the vein daily.  . Dulaglutide 1.5 MG/0.5ML SOPN Inject 1.5 mg into the skin once a week.  . ENSURE PLUS (ENSURE PLUS) LIQD Take 237 mLs by mouth as needed.  . lactulose (CHRONULAC) 10 GM/15ML solution Take 30 mLs (20 g total) by mouth 2 (two) times daily.  Marland Kitchen lidocaine (LIDODERM) 5 % Apply to left neck at 8 am and remove at 8 pm daily.  . methocarbamol (ROBAXIN) 500 MG tablet Take 1 tablet (500 mg total) by mouth every 6 (six) hours as needed for muscle spasms.  Marland Kitchen morphine (MS CONTIN) 15 MG 12 hr tablet Take 1 tablet (15 mg total) by mouth every 12 (twelve) hours.  . Nutritional Supplements (GLUCERNA PO) Take by mouth as needed.  Marland Kitchen oxyCODONE (OXY IR/ROXICODONE) 5 MG immediate release tablet Take 1 tablet (5 mg total) by mouth every 4 (four) hours as needed for severe pain.  . pantoprazole (PROTONIX) 40 MG tablet TAKE 1 TABLET (40 MG TOTAL) BY MOUTH DAILY.  Marland Kitchen propranolol (INDERAL) 20 MG tablet TAKE 1 TABLET (20 MG TOTAL) BY MOUTH DAILY.  Marland Kitchen senna-docusate (SENOKOT-S) 8.6-50 MG per tablet Take 2 tablets by mouth at  bedtime. If no BM in 24 hours  . simethicone (MYLICON) 80 MG chewable tablet Chew 2 tablets (160 mg total) by mouth 4 (four) times daily as needed for flatulence. For gas/bloating  . Vitamin D, Ergocalciferol, (DRISDOL) 50000 UNITS CAPS capsule Take 1 capsule (50,000 Units total) by mouth every 7 (seven) days.  . [DISCONTINUED] acetaminophen (TYLENOL) 325 MG tablet Take 1-2 tablets (325-650 mg total) by mouth every 4 (four) hours as needed for mild pain.  . [DISCONTINUED] Amino  Acids-Protein Hydrolys (FEEDING SUPPLEMENT, PRO-STAT SUGAR FREE 64,) LIQD Take 30 mLs by mouth 3 (three) times daily.  . [DISCONTINUED] cetirizine (ZYRTEC) 10 MG tablet Take 10 mg by mouth daily as needed for allergies.   . [DISCONTINUED] furosemide (LASIX) 80 MG tablet Take 1 tablet (80 mg total) by mouth daily.  . [DISCONTINUED] glimepiride (AMARYL) 4 MG tablet Take 2 tablets (8 mg total) by mouth daily with breakfast.  . [DISCONTINUED] linagliptin (TRADJENTA) 5 MG TABS tablet Take 1 tablet (5 mg total) by mouth every morning.  . [DISCONTINUED] spironolactone (ALDACTONE) 100 MG tablet Take 2 tablets (200 mg total) by mouth daily.   No facility-administered encounter medications on file as of 11/28/2014.   No Known Allergies Patient Active Problem List   Diagnosis Date Noted  . Ascites 10/30/2014  . Ileus, postoperative 10/26/2014  . Left cervical radiculopathy 10/25/2014  . Cervical myelopathy 10/25/2014  . Thrombocytopenia 10/22/2014  . Group B streptococcal infection   . Abscess in epidural space of cervical spine   . Encephalopathy acute   . Streptococcal bacteremia   . NASH (nonalcoholic steatohepatitis)   . Other cirrhosis of liver   . Diabetes mellitus type 2, uncontrolled   . 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 UGIB (upper gastrointestinal bleed) 10/27/2012  . Anemia due to acute blood loss 10/27/2012  . Volume depletion 10/27/2012  . 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 GERD 03/28/2010  . CONSTIPATION 03/28/2010  . ABDOMINAL PAIN, GENERALIZED 03/28/2010  . ASCITES 06/13/2008  . PERSONAL HX COLONIC POLYPS 06/13/2008  . DM 09/27/2007  . OBESITY 09/27/2007  . PANCYTOPENIA 09/27/2007  . HYPERTENSION 09/27/2007  . INTERNAL HEMORRHOIDS 09/27/2007  . CIRRHOSIS 09/27/2007  . NEPHROLITHIASIS 09/27/2007  . IRON DEFICIENCY ANEMIA, HX OF 09/27/2007  . COLONIC POLYPS, ADENOMATOUS 11/19/2006  . ESOPHAGEAL VARICES 11/19/2006  . DUODENITIS 11/19/2006  .  ESOPHAGITIS, REFLUX 09/23/2004  . ESOPHAGEAL STRICTURE 09/23/2004  . Portal hypertension 09/23/2004  . DIVERTICULOSIS, COLON 08/14/2004   History   Social History  . Marital Status: Married    Spouse Name: N/A  . Number of Children: N/A  . Years of Education: N/A   Occupational History  . Not on file.   Social History Main Topics  . Smoking status: Never Smoker   . Smokeless tobacco: Never Used  . Alcohol Use: No     Comment: very rare  . Drug Use: No  . Sexual Activity: No   Other Topics Concern  . Not on file   Social History Narrative    [name]'s family history includes Aneurysm in his mother; Diabetes in his father and another family member; Heart disease in his father and other. There is no history of Colon cancer, Anesthesia problems, Hypotension, Malignant hyperthermia, or Pseudochol deficiency.      Objective:    Filed Vitals:   11/28/14 0830  BP: 96/60  Pulse: 76    Physical Exam  well-developed older white male, lying on exam table chronically ill-appearing accompanied by his  wife blood pressure 96/60 pulse 76 height 5 foot 10 weight 153, down 28 pounds from weight of 201 on 11/07/2014 while hospitalized. HEENT; nontraumatic normocephalic EOMI PERRLA sclera anicteric, Supple ;no JVD, Cardiovascular; regular rate and rhythm with S1-S2 no murmur rub or gallop, Pulmonary ;clear bilaterally abdomen soft nondistended no appreciable fluid wave or ascites nontender sounds present, Rectal exam not done, Extremities ;no edema, Skin warm and dry, Neuropsych; mood and affect appropriate no asterixis       Assessment & Plan:   #1 64 yo male with decompensated Cirrhosis felt secondary to NASH- recent anasarca- now down almost 50 pounds over past month #2 Epidural abscess- hospitalized 4/ 2016- remains on IV antibiotics- cervical myelopathy and radiculopathy #3 AODM #4 GERD #5 small esophageal varices- due for EGD 03/2015 #6 chronic constipation exacerbated by  meds   Plan; labs today- CMET,PT,ammonia Stop lasix and aldactone for now and monitor daily weights Continue Chronulac 30 cc BID, add Miralax QD- BID Low Sodium diet  Follow up [name] 6 weeks      Kerilyn Cortner S Yatzil Clippinger PA-C 11/28/2014   Cc: [name], MD

## 2014-11-30 ENCOUNTER — Telehealth: Payer: Self-pay | Admitting: *Deleted

## 2014-11-30 NOTE — Telephone Encounter (Signed)
Allen PT with [facility] called to request extension on HHPT. Mr Najarian is still receiving IV antibiotics and not ready to go to outpt.  Probable frequency 2wk3.  Approval given.

## 2014-12-05 ENCOUNTER — Encounter: Payer: Self-pay | Admitting: Infectious Disease

## 2014-12-05 ENCOUNTER — Ambulatory Visit (INDEPENDENT_AMBULATORY_CARE_PROVIDER_SITE_OTHER): Payer: Managed Care, Other (non HMO) | Admitting: Infectious Disease

## 2014-12-05 VITALS — BP 122/75 | HR 103 | Temp 98.1°F | Wt 153.0 lb

## 2014-12-05 DIAGNOSIS — R7881 Bacteremia: Secondary | ICD-10-CM | POA: Diagnosis not present

## 2014-12-05 DIAGNOSIS — A491 Streptococcal infection, unspecified site: Secondary | ICD-10-CM

## 2014-12-05 DIAGNOSIS — G061 Intraspinal abscess and granuloma: Secondary | ICD-10-CM | POA: Diagnosis not present

## 2014-12-05 DIAGNOSIS — G062 Extradural and subdural abscess, unspecified: Secondary | ICD-10-CM

## 2014-12-05 DIAGNOSIS — D61818 Other pancytopenia: Secondary | ICD-10-CM | POA: Diagnosis not present

## 2014-12-05 DIAGNOSIS — E1165 Type 2 diabetes mellitus with hyperglycemia: Secondary | ICD-10-CM

## 2014-12-05 DIAGNOSIS — R6520 Severe sepsis without septic shock: Secondary | ICD-10-CM

## 2014-12-05 DIAGNOSIS — B955 Unspecified streptococcus as the cause of diseases classified elsewhere: Secondary | ICD-10-CM

## 2014-12-05 DIAGNOSIS — D696 Thrombocytopenia, unspecified: Secondary | ICD-10-CM

## 2014-12-05 DIAGNOSIS — IMO0002 Reserved for concepts with insufficient information to code with codable children: Secondary | ICD-10-CM

## 2014-12-05 DIAGNOSIS — B954 Other streptococcus as the cause of diseases classified elsewhere: Secondary | ICD-10-CM

## 2014-12-05 DIAGNOSIS — B951 Streptococcus, group B, as the cause of diseases classified elsewhere: Secondary | ICD-10-CM

## 2014-12-05 DIAGNOSIS — A419 Sepsis, unspecified organism: Secondary | ICD-10-CM

## 2014-12-05 MED ORDER — AMOXICILLIN 500 MG PO CAPS: 500.0000 mg | ORAL_CAPSULE | Freq: Three times a day (TID) | ORAL | Status: DC

## 2014-12-05 NOTE — Progress Notes (Signed)
   Subjective:    Patient ID: [name], male    DOB: [birth year], 64 y.o.   MRN: [id number]  HPI  64 y.o. male with with GROUP B STREPTOCCAL bacteremia and remarkable epidural abscess that extends in the foreman magnum down through C6 . He has comorbid NASH, thrombocytopenia and was not felt to be a good operative candidtate. Since being on ROcephin (currently at 2g daily vs Q12) he has had improvement in UE strength but his left leg has felt like a "board" and is painful and week intermittently. Dr. Saintclair Halsted is trying to get MRI of the L spine but waiting on approval from insurance.    Review of Systems  Constitutional: Negative for fever, chills, diaphoresis, activity change, appetite change, fatigue and unexpected weight change.  HENT: Negative for congestion, rhinorrhea, sinus pressure, sneezing, sore throat and trouble swallowing.   Eyes: Negative for photophobia and visual disturbance.  Respiratory: Negative for cough, chest tightness, shortness of breath, wheezing and stridor.   Cardiovascular: Negative for chest pain, palpitations and leg swelling.  Gastrointestinal: Negative for nausea, vomiting, abdominal pain, diarrhea, constipation, blood in stool, abdominal distention and anal bleeding.  Genitourinary: Negative for dysuria, hematuria, flank pain and difficulty urinating.  Musculoskeletal: Negative for myalgias, back pain, joint swelling, arthralgias and gait problem.  Skin: Negative for color change, pallor, rash and wound.  Neurological: Positive for weakness. Negative for dizziness, tremors and light-headedness.  Hematological: Negative for adenopathy. Does not bruise/bleed easily.  Psychiatric/Behavioral: Negative for behavioral problems, confusion, sleep disturbance, dysphoric mood, decreased concentration and agitation.       Objective:   Physical Exam  Constitutional: He is oriented to person, place, and time. He appears well-developed and well-nourished.  HENT:    Head: Normocephalic and atraumatic.  Eyes: Conjunctivae and EOM are normal.  Neck: Normal range of motion. Neck supple.  Cardiovascular: Normal rate and regular rhythm.   Pulmonary/Chest: Effort normal. No respiratory distress. He has no wheezes.  Abdominal: Soft. He exhibits no distension.  Musculoskeletal: Normal range of motion. He exhibits no edema or tenderness.  Neurological: He is alert and oriented to person, place, and time. No cranial nerve deficit. GCS eye subscore is 4. GCS verbal subscore is 5. GCS motor subscore is 6.  Reflex Scores:      Bicep reflexes are 4+ on the right side and 4+ on the left side.      Patellar reflexes are 4+ on the right side and 4+ on the left side.      Achilles reflexes are 4+ on the right side and 4+ on the left side. Skin: Skin is warm and dry. No rash noted. No erythema.  Psychiatric: He has a normal mood and affect. His behavior is normal.          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

## 2014-12-06 DIAGNOSIS — G959 Disease of spinal cord, unspecified: Secondary | ICD-10-CM | POA: Diagnosis not present

## 2014-12-06 DIAGNOSIS — K7581 Nonalcoholic steatohepatitis (NASH): Secondary | ICD-10-CM

## 2014-12-06 DIAGNOSIS — E669 Obesity, unspecified: Secondary | ICD-10-CM

## 2014-12-06 DIAGNOSIS — K7031 Alcoholic cirrhosis of liver with ascites: Secondary | ICD-10-CM | POA: Diagnosis not present

## 2014-12-06 DIAGNOSIS — Z452 Encounter for adjustment and management of vascular access device: Secondary | ICD-10-CM | POA: Diagnosis not present

## 2014-12-06 DIAGNOSIS — I10 Essential (primary) hypertension: Secondary | ICD-10-CM

## 2014-12-06 DIAGNOSIS — L0211 Cutaneous abscess of neck: Secondary | ICD-10-CM | POA: Diagnosis not present

## 2014-12-06 DIAGNOSIS — Z5181 Encounter for therapeutic drug level monitoring: Secondary | ICD-10-CM

## 2014-12-06 DIAGNOSIS — E1140 Type 2 diabetes mellitus with diabetic neuropathy, unspecified: Secondary | ICD-10-CM

## 2014-12-08 ENCOUNTER — Other Ambulatory Visit: Payer: Self-pay | Admitting: Physical Medicine and Rehabilitation

## 2014-12-25 ENCOUNTER — Telehealth: Payer: Self-pay | Admitting: Internal Medicine

## 2014-12-25 NOTE — Telephone Encounter (Signed)
Patient c/o ascites, he is requesting a paracentesis.  Reports that "fluid came back over the last several days".  He reports that he is uncomfortable and doesn't feel he can wait until 01/03/15 when he has a follow up with [name].  He is advised that [name] will return to the office tomorrow and we will call back with the recommendations.  His last paracentesis looks like was 11/03/14.  They removed 3 liters.  [name] please advise if ok to set up direct paracentesis? If ok how many liters? Does he require albumin? Does he need any studies on the fluid?

## 2014-12-26 ENCOUNTER — Other Ambulatory Visit: Payer: Self-pay

## 2014-12-26 DIAGNOSIS — R188 Other ascites: Secondary | ICD-10-CM

## 2014-12-26 MED ORDER — FUROSEMIDE 20 MG PO TABS: 40.0000 mg | ORAL_TABLET | Freq: Every day | ORAL | Status: DC

## 2014-12-26 MED ORDER — SPIRONOLACTONE 100 MG PO TABS: 100.0000 mg | ORAL_TABLET | Freq: Two times a day (BID) | ORAL | Status: DC

## 2014-12-26 NOTE — Telephone Encounter (Signed)
Increase Lasix to 40 mg daily, make sure no added salt in his diet, check basic metabolic panel now and in one week. Thanks. Also, have him measure his weight daily at the same time and record. Thanks

## 2014-12-26 NOTE — Telephone Encounter (Signed)
Patient calling back to see if we have any answers for him.

## 2014-12-26 NOTE — Telephone Encounter (Signed)
Yes, set up large volume paracentesis (up to 8 L) with albumen replacement. Please send the fluid for cell count with differential. Also, please confirm his current diuretic regimen. Thank you

## 2014-12-26 NOTE — Addendum Note (Signed)
Addended by: Rosanne Sack R on: 12/26/2014 03:25 PM   Modules accepted: Orders

## 2014-12-26 NOTE — Telephone Encounter (Signed)
Pt aware and orders in epic. Scripts sent to pharmacy for pt.

## 2014-12-26 NOTE — Telephone Encounter (Signed)
Pt scheduled for US guided paracentesis with max to be drawn off 8 liters 12/27/14@[facility] . Pt to arrive there at 1:15pm for a 1:30pm appt. Pt will receive albumin 8gm IV for every liter of fluid drawn off. Fluid to be sent for cell count and diff. Pt is currently taking Lasix 20mg  daily and Aldactone 100mg  BID. Any changes? Please advise.

## 2014-12-27 ENCOUNTER — Ambulatory Visit (HOSPITAL_COMMUNITY)
Admission: RE | Admit: 2014-12-27 | Discharge: 2014-12-27 | Disposition: A | Discharge status: Home / Self Care | Payer: Managed Care, Other (non HMO) | Source: Ambulatory Visit | Attending: Internal Medicine | Admitting: Internal Medicine

## 2014-12-27 DIAGNOSIS — R188 Other ascites: Secondary | ICD-10-CM | POA: Insufficient documentation

## 2014-12-27 LAB — BODY FLUID CELL COUNT WITH DIFFERENTIAL
EOS FL: 0 %
Lymphs, Fluid: 37 %
Monocyte-Macrophage-Serous Fluid: 29 % — ABNORMAL LOW (ref 50–90)
Neutrophil Count, Fluid: 34 % — ABNORMAL HIGH (ref 0–25)
Other Cells, Fluid: 0 %
WBC FLUID: 410 uL (ref 0–1000)

## 2014-12-27 MED ORDER — LIDOCAINE HCL (PF) 1 % IJ SOLN
INTRAMUSCULAR | Status: DC
Start: 2014-12-27 — End: 2014-12-28
  Filled 2014-12-27: qty 10

## 2014-12-28 LAB — PATHOLOGIST SMEAR REVIEW

## 2014-12-29 ENCOUNTER — Ambulatory Visit (HOSPITAL_COMMUNITY): Payer: Managed Care, Other (non HMO)

## 2015-01-01 ENCOUNTER — Other Ambulatory Visit: Payer: Self-pay

## 2015-01-03 ENCOUNTER — Other Ambulatory Visit (INDEPENDENT_AMBULATORY_CARE_PROVIDER_SITE_OTHER): Payer: Managed Care, Other (non HMO)

## 2015-01-03 ENCOUNTER — Encounter: Payer: Self-pay | Admitting: Internal Medicine

## 2015-01-03 ENCOUNTER — Other Ambulatory Visit: Payer: Self-pay

## 2015-01-03 ENCOUNTER — Ambulatory Visit (INDEPENDENT_AMBULATORY_CARE_PROVIDER_SITE_OTHER): Payer: Managed Care, Other (non HMO) | Admitting: Internal Medicine

## 2015-01-03 VITALS — BP 108/60 | HR 72 | Ht 70.0 in | Wt 162.0 lb

## 2015-01-03 DIAGNOSIS — K7460 Unspecified cirrhosis of liver: Secondary | ICD-10-CM

## 2015-01-03 DIAGNOSIS — R188 Other ascites: Principal | ICD-10-CM

## 2015-01-03 DIAGNOSIS — I8500 Esophageal varices without bleeding: Secondary | ICD-10-CM

## 2015-01-03 DIAGNOSIS — K7581 Nonalcoholic steatohepatitis (NASH): Secondary | ICD-10-CM

## 2015-01-03 DIAGNOSIS — Z86010 Personal history of colonic polyps: Secondary | ICD-10-CM

## 2015-01-03 DIAGNOSIS — K219 Gastro-esophageal reflux disease without esophagitis: Secondary | ICD-10-CM

## 2015-01-03 LAB — COMPREHENSIVE METABOLIC PANEL
ALBUMIN: 3.4 g/dL — AB (ref 3.5–5.2)
ALT: 20 U/L (ref 0–53)
AST: 24 U/L (ref 0–37)
Alkaline Phosphatase: 97 U/L (ref 39–117)
BUN: 20 mg/dL (ref 6–23)
CHLORIDE: 93 meq/L — AB (ref 96–112)
CO2: 29 mEq/L (ref 19–32)
Calcium: 9.8 mg/dL (ref 8.4–10.5)
Creatinine, Ser: 1.3 mg/dL (ref 0.40–1.50)
GFR: 59.39 mL/min — ABNORMAL LOW (ref >60.00)
Glucose, Bld: 360 mg/dL — ABNORMAL HIGH (ref 70–99)
Potassium: 4.5 mEq/L (ref 3.5–5.1)
SODIUM: 128 meq/L — AB (ref 135–145)
Total Bilirubin: 2.4 mg/dL — ABNORMAL HIGH (ref 0.2–1.2)
Total Protein: 7.7 g/dL (ref 6.0–8.3)

## 2015-01-03 LAB — CBC WITH DIFFERENTIAL/PLATELET
Basophils Absolute: 0 10*3/uL (ref 0.0–0.1)
Basophils Relative: 0.2 % (ref 0.0–3.0)
Eosinophils Absolute: 0.1 10*3/uL (ref 0.0–0.7)
Eosinophils Relative: 2.2 % (ref 0.0–5.0)
HEMATOCRIT: 41.7 % (ref 39.0–52.0)
Hemoglobin: 14.1 g/dL (ref 13.0–17.0)
LYMPHS ABS: 0.7 10*3/uL (ref 0.7–4.0)
Lymphocytes Relative: 16.1 % (ref 12.0–46.0)
MCHC: 33.9 g/dL (ref 30.0–36.0)
MCV: 87.5 fl (ref 78.0–100.0)
Monocytes Absolute: 0.5 10*3/uL (ref 0.1–1.0)
Monocytes Relative: 12.1 % — ABNORMAL HIGH (ref 3.0–12.0)
NEUTROS PCT: 69.4 % (ref 43.0–77.0)
Neutro Abs: 3.1 10*3/uL (ref 1.4–7.7)
RBC: 4.76 Mil/uL (ref 4.22–5.81)
RDW: 16.3 % — AB (ref 11.5–15.5)
WBC: 4.4 10*3/uL (ref 4.0–10.5)

## 2015-01-03 LAB — PROTIME-INR
INR: 1.3 ratio — AB (ref 0.8–1.0)
PROTHROMBIN TIME: 14.3 s — AB (ref 9.6–13.1)

## 2015-01-03 NOTE — Patient Instructions (Addendum)
Your physician has requested that you go to the basement for lab work before leaving today.   Please follow up with [name] on 04/02/2015 at 8:30am

## 2015-01-03 NOTE — Progress Notes (Signed)
HISTORY OF PRESENT ILLNESS:  [name] is a 64 y.o. male with multiple medical problems as listed below. 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 Complications of his liver disease have included variceal bleeding and ascites. He is also followed at the [facility] liver clinic I last saw the patient in the office 02/07/2014. He was clinically compensated at that time. He last underwent surveillance upper endoscopy 06/13/2014. Scarring from prior variceal band ligation with trivial mid esophageal varices not requiring banding as well as moderate portal gastropathy noted. Routine office follow-up in 6 months and repeat EGD in 1 year recommended. Patient has had significant interval change in his medical history. He was hospitalized in April 2016 with a cervical epidural abscess with associated tetraplegia. Subsequently seen by GI early May 2016 for decompensated cirrhosis with anasarca and ascites. He was treated with diuretic's. Patient did have office follow-up 11/28/2014 with the GI physician assistant. Follow-up labs were obtained. Of note, sodium 130, BUN 23, creatinine 1.07, glucose 382, albumen 2.9, total bilirubin 1.0, INR 1.4. His diuretic's were discontinued. He was told to monitor daily weights and follow-up at this time. He is accompanied by his wife. He contacted the office last week complaining of increased weight gain and ascites. On June 22 he underwent paracentesis removing 2.2 L of fluid. Cell count with differential did not demonstrate SBP. He was placed back on diuretic's. Currently taking Aldactone 200 mg daily and furosemide 40 mg daily. His weights at home over the past week have been stable at 159 pounds. Office weight today 162 pounds. Office weight 1 month ago 153 pounds. Patient continues on antibiotics. Still complains of weakness in his left side. Overall fatigue. No GI bleeding. Nothing to suggest clinically significant  encephalopathy. He continues on PPI for GERD. Not using lactulose. His last colonoscopy was September 2013 with 1.2 cm tubular villous adenoma removed. Routine follow-up in 3 years recommended   REVIEW OF SYSTEMS:  All non-GI ROS negative except for confusion, depression, fatigue, muscle cramps, sleeping problems, ankle edema, increased thirst, increased urination, voice change   Past Medical History  Diagnosis Date  . Reflux esophagitis   . Portal hypertension   . Stricture and stenosis of esophagus   . Diverticulosis of colon (without mention of hemorrhage)   . Internal hemorrhoids without mention of complication   . Benign neoplasm of colon   . Duodenitis without mention of hemorrhage   . 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 Personal history of diseases of blood and blood-forming organs   . 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   Marland Kitchen Unspecified essential hypertension   . Obesity, unspecified   . Type II or unspecified type diabetes mellitus without mention of complication, not stated as uncontrolled   . GERD (gastroesophageal reflux disease)   . Hemorrhoids   . Colon polyps 2004, 2006, 2008, 11/2011    Adenomatous and tubulovillous adenoma.  . Pancytopenia   . Calculus of kidney yrs ago  . Ascites     Past Surgical History  Procedure Laterality Date  . Kidney stone surgery  yrs ago  . Esophagogastroduodenoscopy  08/01/2011    Procedure: ESOPHAGOGASTRODUODENOSCOPY (EGD);  Surgeon: Scarlette Shorts, MD;  Location: [facility];  Service: Endoscopy;  Laterality: N/A;  . Esophagogastroduodenoscopy N/A 10/27/2012    Procedure: ESOPHAGOGASTRODUODENOSCOPY (EGD);  Surgeon: Inda Castle, MD;  Location: [facility];  Service: Endoscopy;  Laterality: N/A;  . Esophagogastroduodenoscopy N/A 11/22/2012    Procedure: ESOPHAGOGASTRODUODENOSCOPY (EGD);  Surgeon: Irene Shipper, MD;  Location: [facility];  Service: Endoscopy;  Laterality: N/A;  . Colonoscopy     . Upper gastrointestinal endoscopy    . Esophagogastroduodenoscopy N/A 11/29/2013    Procedure: ESOPHAGOGASTRODUODENOSCOPY (EGD);  Surgeon: Irene Shipper, MD;  Location: [facility];  Service: Endoscopy;  Laterality: N/A;  . Esophageal banding Bilateral 11/29/2013    Procedure: ESOPHAGEAL BANDING;  Surgeon: Irene Shipper, MD;  Location: [facility];  Service: Endoscopy;  Laterality: Bilateral;  . Esophagogastroduodenoscopy N/A 06/13/2014    Procedure: ESOPHAGOGASTRODUODENOSCOPY (EGD);  Surgeon: Irene Shipper, MD;  Location: [facility];  Service: Endoscopy;  Laterality: N/A;  . Esophageal banding N/A 06/13/2014    Procedure: ESOPHAGEAL BANDING;  Surgeon: Irene Shipper, MD;  Location: [facility];  Service: Endoscopy;  Laterality: N/A;    Social History [name]  reports that he has never smoked. He has never used smokeless tobacco. He reports that he does not drink alcohol or use illicit drugs.  family history includes Aneurysm in his mother; Diabetes in his father and another family member; Heart disease in his father and other. There is no history of Colon cancer, Anesthesia problems, Hypotension, Malignant hyperthermia, or Pseudochol deficiency.  No Known Allergies     PHYSICAL EXAMINATION: Vital signs: BP 108/60 mmHg  Pulse 72  Ht 5\' 10"  (1.778 m)  Wt 162 lb (73.483 kg)  BMI 23.24 kg/m2  Constitutional:  chronically ill-appearing with muscular wasting,no acute distress Psychiatric: alert and oriented x3, cooperative Eyes: extraocular movements intact, anicteric, conjunctiva pink Mouth: oral pharynx moist, no lesions Neck: supple no lymphadenopathy Cardiovascular: heart regular rate and rhythm, no murmur Lungs: clear to auscultation bilaterally Abdomen: soft, nontender, nondistended, no obvious ascites, no peritoneal signs, normal bowel sounds, no organomegaly Rectal: omitted  Extremities: no lower extremity edema bilaterally Skin: no lesions on visible  extremities Neuro: No focal deficits. No asterixis.    ASSESSMENT:  #1. Nash cirrhosis. Decompensated in the face of acute illness (epidural abscess) #2. Ascites and volume overload #3. History of bleeding varices. Last endoscopy December 2015 as described. Also, portal gastropathy #4. History of advanced colonic neoplasia. Last colonoscopy September 2013 #5. GERD. Asymptomatic on PPI #6. Multiple medical problems. Ongoing   PLAN:  #1. Continue diuretic's and daily weights at home  #2. Repeat laboratories today including CBC, comprehensive metabolic panel, INR #3. Due for surveillance colonoscopy around September 2016. Indefinitely on hold given current medical problems #4. Due for surveillance upper endoscopy around December 2016. On hold indefinitely given acute medical problems #5. Ongoing follow-up at [facility] liver clinic #6. GI office follow-up 3 months here #7. Ongoing general medical care with multiple specialists and PCP  30 minutes spent with patient face-to-face. Greater than 50% of the time spent counseling giving his multiple significant problems as listed above and plans as outlined. Multiple questions from patient and wife also answered to their satisfaction

## 2015-01-15 ENCOUNTER — Encounter: Payer: Managed Care, Other (non HMO) | Admitting: Physical Medicine & Rehabilitation

## 2015-01-16 ENCOUNTER — Encounter: Payer: Self-pay | Admitting: Infectious Disease

## 2015-01-16 ENCOUNTER — Telehealth: Payer: Self-pay | Admitting: Internal Medicine

## 2015-01-16 ENCOUNTER — Other Ambulatory Visit: Payer: Self-pay

## 2015-01-16 ENCOUNTER — Other Ambulatory Visit (INDEPENDENT_AMBULATORY_CARE_PROVIDER_SITE_OTHER): Payer: Managed Care, Other (non HMO)

## 2015-01-16 ENCOUNTER — Ambulatory Visit (INDEPENDENT_AMBULATORY_CARE_PROVIDER_SITE_OTHER): Payer: Managed Care, Other (non HMO) | Admitting: Infectious Disease

## 2015-01-16 VITALS — BP 120/83 | HR 99 | Temp 98.4°F | Ht 70.0 in | Wt 171.0 lb

## 2015-01-16 DIAGNOSIS — G062 Extradural and subdural abscess, unspecified: Secondary | ICD-10-CM

## 2015-01-16 DIAGNOSIS — K7460 Unspecified cirrhosis of liver: Secondary | ICD-10-CM | POA: Diagnosis not present

## 2015-01-16 DIAGNOSIS — A491 Streptococcal infection, unspecified site: Secondary | ICD-10-CM

## 2015-01-16 DIAGNOSIS — B954 Other streptococcus as the cause of diseases classified elsewhere: Secondary | ICD-10-CM

## 2015-01-16 DIAGNOSIS — K7469 Other cirrhosis of liver: Secondary | ICD-10-CM

## 2015-01-16 DIAGNOSIS — B951 Streptococcus, group B, as the cause of diseases classified elsewhere: Secondary | ICD-10-CM | POA: Diagnosis not present

## 2015-01-16 DIAGNOSIS — G959 Disease of spinal cord, unspecified: Secondary | ICD-10-CM

## 2015-01-16 DIAGNOSIS — E1165 Type 2 diabetes mellitus with hyperglycemia: Secondary | ICD-10-CM

## 2015-01-16 DIAGNOSIS — R7881 Bacteremia: Secondary | ICD-10-CM | POA: Diagnosis not present

## 2015-01-16 DIAGNOSIS — R188 Other ascites: Principal | ICD-10-CM

## 2015-01-16 DIAGNOSIS — G9340 Encephalopathy, unspecified: Secondary | ICD-10-CM

## 2015-01-16 DIAGNOSIS — B955 Unspecified streptococcus as the cause of diseases classified elsewhere: Secondary | ICD-10-CM

## 2015-01-16 DIAGNOSIS — K7581 Nonalcoholic steatohepatitis (NASH): Secondary | ICD-10-CM

## 2015-01-16 DIAGNOSIS — IMO0002 Reserved for concepts with insufficient information to code with codable children: Secondary | ICD-10-CM

## 2015-01-16 LAB — BASIC METABOLIC PANEL
BUN: 19 mg/dL (ref 6–23)
CO2: 25 meq/L (ref 19–32)
Calcium: 9.5 mg/dL (ref 8.4–10.5)
Chloride: 101 mEq/L (ref 96–112)
Creatinine, Ser: 1.12 mg/dL (ref 0.40–1.50)
GFR: 70.53 mL/min (ref >60.00)
Glucose, Bld: 292 mg/dL — ABNORMAL HIGH (ref 70–99)
Potassium: 4.3 mEq/L (ref 3.5–5.1)
Sodium: 133 mEq/L — ABNORMAL LOW (ref 135–145)

## 2015-01-16 MED ORDER — AMOXICILLIN 500 MG PO CAPS: 500.0000 mg | ORAL_CAPSULE | Freq: Three times a day (TID) | ORAL | Status: DC

## 2015-01-16 NOTE — Progress Notes (Signed)
Subjective:    Patient ID: [name], male    DOB: Nov 18, 1950, 64 y.o.   MRN: [id number]  HPI   64 y.o. male with with GROUP B STREPTOCCAL bacteremia and remarkable epidural abscess that extends in the foreman magnum down through C6 . He has comorbid NASH, thrombocytopenia and was not felt to be a good operative candidtate. Since being on ROcephin (currently at 2g daily vs Q12) he has had improvement in UE strength but his left leg has felt like a "board" and is painful and week intermittently.   Since I last saw him we changed him to high dose amoxicillin and he has had MRI of C and L spine. 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 Wynetta Emery wishes to continue with nonoperative management and protracted abx.  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   He has understandably not returned to work--nor should he at ANY time in the near future.    Review of Systems  Constitutional: Negative for fever, chills, diaphoresis, activity change, appetite change, fatigue and unexpected weight change.  HENT: Negative for congestion, rhinorrhea, sinus pressure, sneezing, sore throat and trouble swallowing.   Eyes: Negative for photophobia and visual disturbance.  Respiratory: Negative for cough, chest tightness, shortness of breath, wheezing and stridor.   Cardiovascular: Negative for chest pain, palpitations and leg swelling.  Gastrointestinal: Negative for nausea, vomiting, abdominal pain, diarrhea, constipation, blood in stool, abdominal distention and anal bleeding.  Genitourinary: Negative for dysuria, hematuria, flank pain and difficulty urinating.  Musculoskeletal: Negative for myalgias, back pain, joint swelling, arthralgias and gait problem.  Skin: Negative for color change, pallor, rash and wound.  Neurological: Positive for weakness and  numbness. Negative for dizziness, tremors and light-headedness.  Hematological: Negative for adenopathy. Does not bruise/bleed easily.  Psychiatric/Behavioral: Positive for confusion. Negative for behavioral problems, sleep disturbance, dysphoric mood, decreased concentration and agitation.       Objective:   Physical Exam  Constitutional: He is oriented to person, place, and time. He appears well-developed and well-nourished.  HENT:  Head: Normocephalic and atraumatic.  Eyes: Conjunctivae and EOM are normal.  Neck: Normal range of motion. Neck supple.  Cardiovascular: Normal rate and regular rhythm.   Pulmonary/Chest: Effort normal. No respiratory distress. He has no wheezes.  Abdominal: Soft. He exhibits no distension.  Musculoskeletal: Normal range of motion. He exhibits no edema or tenderness.  Neurological: He is alert and oriented to person, place, and time. No cranial nerve deficit. GCS eye subscore is 4. GCS verbal subscore is 5. GCS motor subscore is 6.  Reflex Scores:      Bicep reflexes are 4+ on the right side and 4+ on the left side.      Patellar reflexes are 4+ on the right side and 4+ on the left side.      Achilles reflexes are 4+ on the right side and 4+ on the left side. Skin: Skin is warm and dry. No rash noted. No erythema.  Psychiatric: He has a normal mood and affect. His behavior is normal.          Assessment & Plan:   Large cervical epidural abscess in setting of GBS bacteremia and severe sepsis now with residual persistent MRI changes in disk at C3  --continue high dose oral amoxicillin likely for a year with close followup --he  is to report to myself and more importantly to Dr. Saintclair Halsted IMMEDIATELY should he have worsening neck pain, or new or worsening neurological ssx that would portend need for urgent Neurosurgical intervention  We will check ESR, CRP next visiti and down the road likely reimage his C spine again   NASH: followed by Dr [name] with  recent bout of encephalopathy. 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 He will likely be permanently disabled though I am not a disability physician, this is my opinion as an ID MD but may be important to get further data from his Neurosurgeon, his GI MD and his PCP as well   I spent greater than 40 minutes with the patient including greater than 50% of time in face to face counsel of the patient re his C spine infection, his hepatic encephalopathy, his disability and in coordination of their care.

## 2015-01-16 NOTE — Telephone Encounter (Signed)
Left message to call back  

## 2015-01-17 ENCOUNTER — Telehealth: Payer: Self-pay | Admitting: *Deleted

## 2015-01-17 ENCOUNTER — Encounter: Payer: Self-pay | Admitting: Infectious Disease

## 2015-01-17 NOTE — Telephone Encounter (Signed)
Left message to call back  

## 2015-01-17 NOTE — Telephone Encounter (Signed)
Dental office wanting to know how soon pt may have teeth cleaned.  MD please advise.

## 2015-01-17 NOTE — Telephone Encounter (Signed)
Teeth can be cleaned that is fine

## 2015-01-17 NOTE — Telephone Encounter (Signed)
[employer] nurse case manager calling for tx plan on pt. Request records be faxed. OV note sent to [facility] at [phone].

## 2015-01-18 NOTE — Telephone Encounter (Signed)
Notified Dental Office that pt may have cleaning

## 2015-01-19 ENCOUNTER — Telehealth: Payer: Self-pay | Admitting: Internal Medicine

## 2015-01-19 ENCOUNTER — Other Ambulatory Visit: Payer: Self-pay

## 2015-01-19 DIAGNOSIS — R188 Other ascites: Secondary | ICD-10-CM

## 2015-01-19 NOTE — Telephone Encounter (Signed)
[name] pt with cirrhosis states he has quite a bit of swelling in his belly. Pt is requesting a paracentesis. Dr. Deatra Ina as doc of the day please advise.

## 2015-01-19 NOTE — Telephone Encounter (Signed)
Spoke with pts wife and they are aware of appt.

## 2015-01-19 NOTE — Telephone Encounter (Signed)
Large volume with albumin replacement. Send fluid for cell count with diff

## 2015-01-19 NOTE — Telephone Encounter (Signed)
Pt scheduled for US guided paracentesis at [facility] 01/24/15@9am . Pt to receive albumin replacement 8gm IV for every liter of fluid drawn off. Fluid to be sent off for cell count and diff. Left message for pt to call back.

## 2015-01-24 ENCOUNTER — Ambulatory Visit (HOSPITAL_COMMUNITY)
Admission: RE | Admit: 2015-01-24 | Discharge: 2015-01-24 | Disposition: A | Discharge status: Home / Self Care | Payer: Managed Care, Other (non HMO) | Source: Ambulatory Visit | Attending: Internal Medicine | Admitting: Internal Medicine

## 2015-01-24 ENCOUNTER — Encounter (HOSPITAL_COMMUNITY)
Admission: RE | Admit: 2015-01-24 | Discharge: 2015-01-24 | Disposition: A | Discharge status: Home / Self Care | Payer: Managed Care, Other (non HMO) | Source: Ambulatory Visit | Attending: Internal Medicine | Admitting: Internal Medicine

## 2015-01-24 ENCOUNTER — Encounter (HOSPITAL_COMMUNITY): Payer: Self-pay

## 2015-01-24 DIAGNOSIS — R188 Other ascites: Secondary | ICD-10-CM | POA: Insufficient documentation

## 2015-01-24 DIAGNOSIS — K7460 Unspecified cirrhosis of liver: Secondary | ICD-10-CM | POA: Diagnosis not present

## 2015-01-24 DIAGNOSIS — K7581 Nonalcoholic steatohepatitis (NASH): Secondary | ICD-10-CM | POA: Insufficient documentation

## 2015-01-24 LAB — BODY FLUID CELL COUNT WITH DIFFERENTIAL
EOS FL: 1 %
Lymphs, Fluid: 34 %
MONOCYTE-MACROPHAGE-SEROUS FLUID: 55 % (ref 50–90)
Neutrophil Count, Fluid: 10 % (ref 0–25)
WBC FLUID: 429 uL (ref 0–1000)

## 2015-01-24 LAB — PATHOLOGIST SMEAR REVIEW

## 2015-01-24 MED ORDER — ALBUMIN HUMAN 25 % IV SOLN
30.0000 g | Freq: Once | INTRAVENOUS | Status: AC
  Administered 2015-01-24: 30 g via INTRAVENOUS
  Filled 2015-01-24: qty 150

## 2015-01-24 MED ORDER — SODIUM CHLORIDE 0.9 % IV SOLN
INTRAVENOUS | Status: DC
  Administered 2015-01-24: 11:00:00 via INTRAVENOUS

## 2015-01-24 NOTE — Progress Notes (Signed)
Uneventful infusion of Albumin post paracentesis. Discharged via w.c accompanied by staff and wife to car and the emergency room exit

## 2015-01-24 NOTE — Procedures (Signed)
Successful US guided paracentesis from right lower quadrant.  Yielded 3.8 liters of blood tinged fluid.  No immediate complications.  Pt tolerated well.   Specimen was sent for labs.  Demeco Ducksworth S Shayon Trompeter PA-C 01/24/2015 2:21 PM

## 2015-01-24 NOTE — Discharge Instructions (Signed)
Albumin injection °What is this medicine? °ALBUMIN (al BYOO min) is used to treat or prevent shock following serious injury, bleeding, surgery, or burns by increasing the volume of blood plasma. This medicine can also replace low blood protein. °This medicine may be used for other purposes; ask your health care provider or pharmacist if you have questions. °COMMON BRAND NAME(S): Albuked, Albumarc, Albuminar, Albutein, Buminate, Flexbumin, Kedbumin, Macrotec, Plasbumin °What should I tell my health care provider before I take this medicine? °They need to know if you have any of the following conditions: °-anemia °-heart disease °-kidney disease °-an unusual or allergic reaction to albumin, other medicines, foods, dyes, or preservatives °-pregnant or trying to get pregnant °-breast-feeding °How should I use this medicine? °This medicine is for infusion into a vein. It is given by a health-care professional in a hospital or clinic. °Talk to your pediatrician regarding the use of this medicine in children. While this drug may be prescribed for selected conditions, precautions do apply. °Overdosage: If you think you have taken too much of this medicine contact a poison control center or emergency room at once. °NOTE: This medicine is only for you. Do not share this medicine with others. °What if I miss a dose? °This does not apply. °What may interact with this medicine? °Interactions are not expected. °This list may not describe all possible interactions. Give your health care provider a list of all the medicines, herbs, non-prescription drugs, or dietary supplements you use. Also tell them if you smoke, drink alcohol, or use illegal drugs. Some items may interact with your medicine. °What should I watch for while using this medicine? °Your condition will be closely monitored while you receive this medicine. °Some products are derived from human plasma, and there is a small risk that these products may contain certain  types of virus or bacteria. All products are processed to kill most viruses and bacteria. If you have questions concerning the risk of infections, discuss them with your doctor or health care professional. °What side effects may I notice from receiving this medicine? °Side effects that you should report to your doctor or health care professional as soon as possible: °-allergic reactions like skin rash, itching or hives, swelling of the face, lips, or tongue °-breathing problems °-changes in heartbeat °-fever, chills °-pain, redness or swelling at the injection site °-signs of viral infection including fever, drowsiness, chills, runny nose followed in about 2 weeks by a rash and joint pain °-tightness in the chest °Side effects that usually do not require medical attention (report to your doctor or health care professional if they continue or are bothersome): °-increased salivation °-nausea, vomiting °This list may not describe all possible side effects. Call your doctor for medical advice about side effects. You may report side effects to FDA at 1-800-FDA-1088. °Where should I keep my medicine? °This does not apply. You will not be given this medicine to store at home. °NOTE: This sheet is a summary. It may not cover all possible information. If you have questions about this medicine, talk to your doctor, pharmacist, or health care provider. °© 2015, [employer]. (2007-09-16 10:18:55) ° °

## 2015-01-29 ENCOUNTER — Telehealth: Payer: Self-pay

## 2015-01-29 DIAGNOSIS — R188 Other ascites: Principal | ICD-10-CM

## 2015-01-29 DIAGNOSIS — K7460 Unspecified cirrhosis of liver: Secondary | ICD-10-CM

## 2015-01-29 NOTE — Telephone Encounter (Signed)
Pt would like to have his ammonia level checked later this week along with the scheduled bmet. Can this be added? Please advise.

## 2015-01-29 NOTE — Telephone Encounter (Signed)
Sure

## 2015-01-29 NOTE — Telephone Encounter (Signed)
Order added.

## 2015-01-29 NOTE — Telephone Encounter (Signed)
Pt aware.

## 2015-01-29 NOTE — Telephone Encounter (Signed)
-----   Message from Algernon Huxley, RN sent at 01/16/2015  4:08 PM EDT ----- Regarding: bmet Pt needs bmet in 2 weeks, order in epic.

## 2015-01-30 ENCOUNTER — Emergency Department (HOSPITAL_COMMUNITY): Payer: Managed Care, Other (non HMO)

## 2015-01-30 ENCOUNTER — Inpatient Hospital Stay (HOSPITAL_COMMUNITY): Payer: Managed Care, Other (non HMO)

## 2015-01-30 ENCOUNTER — Inpatient Hospital Stay (HOSPITAL_COMMUNITY)
Admission: EM | Admit: 2015-01-30 | Discharge: 2015-01-31 | DRG: 441 | Disposition: A | Discharge status: Home / Self Care | Payer: Managed Care, Other (non HMO) | Attending: Internal Medicine | Admitting: Internal Medicine

## 2015-01-30 ENCOUNTER — Encounter (HOSPITAL_COMMUNITY): Payer: Self-pay | Admitting: *Deleted

## 2015-01-30 DIAGNOSIS — E1165 Type 2 diabetes mellitus with hyperglycemia: Secondary | ICD-10-CM | POA: Diagnosis not present

## 2015-01-30 DIAGNOSIS — D61818 Other pancytopenia: Secondary | ICD-10-CM | POA: Diagnosis present

## 2015-01-30 DIAGNOSIS — K7200 Acute and subacute hepatic failure without coma: Principal | ICD-10-CM | POA: Diagnosis present

## 2015-01-30 DIAGNOSIS — Z8661 Personal history of infections of the central nervous system: Secondary | ICD-10-CM

## 2015-01-30 DIAGNOSIS — N400 Enlarged prostate without lower urinary tract symptoms: Secondary | ICD-10-CM | POA: Diagnosis present

## 2015-01-30 DIAGNOSIS — K219 Gastro-esophageal reflux disease without esophagitis: Secondary | ICD-10-CM | POA: Diagnosis present

## 2015-01-30 DIAGNOSIS — G061 Intraspinal abscess and granuloma: Secondary | ICD-10-CM | POA: Diagnosis present

## 2015-01-30 DIAGNOSIS — E7220 Disorder of urea cycle metabolism, unspecified: Secondary | ICD-10-CM | POA: Diagnosis present

## 2015-01-30 DIAGNOSIS — Z79891 Long term (current) use of opiate analgesic: Secondary | ICD-10-CM | POA: Diagnosis not present

## 2015-01-30 DIAGNOSIS — Z8249 Family history of ischemic heart disease and other diseases of the circulatory system: Secondary | ICD-10-CM

## 2015-01-30 DIAGNOSIS — K7460 Unspecified cirrhosis of liver: Secondary | ICD-10-CM | POA: Diagnosis present

## 2015-01-30 DIAGNOSIS — I10 Essential (primary) hypertension: Secondary | ICD-10-CM | POA: Diagnosis present

## 2015-01-30 DIAGNOSIS — Z87442 Personal history of urinary calculi: Secondary | ICD-10-CM | POA: Diagnosis not present

## 2015-01-30 DIAGNOSIS — K766 Portal hypertension: Secondary | ICD-10-CM | POA: Diagnosis present

## 2015-01-30 DIAGNOSIS — B951 Streptococcus, group B, as the cause of diseases classified elsewhere: Secondary | ICD-10-CM | POA: Diagnosis present

## 2015-01-30 DIAGNOSIS — Z79899 Other long term (current) drug therapy: Secondary | ICD-10-CM

## 2015-01-30 DIAGNOSIS — M4640 Discitis, unspecified, site unspecified: Secondary | ICD-10-CM | POA: Diagnosis present

## 2015-01-30 DIAGNOSIS — R188 Other ascites: Secondary | ICD-10-CM | POA: Diagnosis present

## 2015-01-30 DIAGNOSIS — I8501 Esophageal varices with bleeding: Secondary | ICD-10-CM | POA: Diagnosis not present

## 2015-01-30 DIAGNOSIS — K7290 Hepatic failure, unspecified without coma: Secondary | ICD-10-CM

## 2015-01-30 DIAGNOSIS — K7682 Hepatic encephalopathy: Secondary | ICD-10-CM | POA: Diagnosis present

## 2015-01-30 DIAGNOSIS — Z86010 Personal history of colonic polyps: Secondary | ICD-10-CM | POA: Diagnosis not present

## 2015-01-30 DIAGNOSIS — G8929 Other chronic pain: Secondary | ICD-10-CM | POA: Diagnosis present

## 2015-01-30 DIAGNOSIS — Z833 Family history of diabetes mellitus: Secondary | ICD-10-CM

## 2015-01-30 DIAGNOSIS — R4182 Altered mental status, unspecified: Secondary | ICD-10-CM | POA: Diagnosis present

## 2015-01-30 LAB — CBC WITH DIFFERENTIAL/PLATELET
Basophils Absolute: 0 10*3/uL (ref 0.0–0.1)
Basophils Relative: 0 % (ref 0–1)
Eosinophils Absolute: 0 10*3/uL (ref 0.0–0.7)
Eosinophils Relative: 1 % (ref 0–5)
HCT: 31.9 % — ABNORMAL LOW (ref 39.0–52.0)
HEMOGLOBIN: 11.1 g/dL — AB (ref 13.0–17.0)
Lymphocytes Relative: 12 % (ref 12–46)
Lymphs Abs: 0.4 10*3/uL — ABNORMAL LOW (ref 0.7–4.0)
MCH: 30 pg (ref 26.0–34.0)
MCHC: 34.8 g/dL (ref 30.0–36.0)
MCV: 86.2 fL (ref 78.0–100.0)
MONO ABS: 0.4 10*3/uL (ref 0.1–1.0)
MONOS PCT: 10 % (ref 3–12)
Neutro Abs: 2.8 10*3/uL (ref 1.7–7.7)
Neutrophils Relative %: 77 % (ref 43–77)
Platelets: 27 10*3/uL — CL (ref 150–400)
RBC: 3.7 MIL/uL — AB (ref 4.22–5.81)
RDW: 15.6 % — AB (ref 11.5–15.5)
WBC: 3.6 10*3/uL — AB (ref 4.0–10.5)

## 2015-01-30 LAB — URINALYSIS, ROUTINE W REFLEX MICROSCOPIC
GLUCOSE, UA: NEGATIVE mg/dL
HGB URINE DIPSTICK: NEGATIVE
Ketones, ur: NEGATIVE mg/dL
LEUKOCYTES UA: NEGATIVE
Nitrite: NEGATIVE
Protein, ur: NEGATIVE mg/dL
SPECIFIC GRAVITY, URINE: 1.026 (ref 1.005–1.030)
Urobilinogen, UA: 1 mg/dL (ref 0.0–1.0)
pH: 6 (ref 5.0–8.0)

## 2015-01-30 LAB — I-STAT CHEM 8, ED
BUN: 21 mg/dL — AB (ref 6–20)
CHLORIDE: 104 mmol/L (ref 101–111)
CREATININE: 0.9 mg/dL (ref 0.61–1.24)
Calcium, Ion: 1.22 mmol/L (ref 1.13–1.30)
GLUCOSE: 154 mg/dL — AB (ref 65–99)
HCT: 33 % — ABNORMAL LOW (ref 39.0–52.0)
Hemoglobin: 11.2 g/dL — ABNORMAL LOW (ref 13.0–17.0)
POTASSIUM: 4.1 mmol/L (ref 3.5–5.1)
Sodium: 137 mmol/L (ref 135–145)
TCO2: 19 mmol/L (ref 0–100)

## 2015-01-30 LAB — COMPREHENSIVE METABOLIC PANEL
ALBUMIN: 3.3 g/dL — AB (ref 3.5–5.0)
ALT: 20 U/L (ref 17–63)
AST: 27 U/L (ref 15–41)
Alkaline Phosphatase: 69 U/L (ref 38–126)
Anion gap: 7 (ref 5–15)
BUN: 22 mg/dL — AB (ref 6–20)
CO2: 23 mmol/L (ref 22–32)
Calcium: 9.2 mg/dL (ref 8.9–10.3)
Chloride: 107 mmol/L (ref 101–111)
Creatinine, Ser: 0.85 mg/dL (ref 0.61–1.24)
GFR calc non Af Amer: >60 mL/min (ref >60)
GLUCOSE: 154 mg/dL — AB (ref 65–99)
POTASSIUM: 4.2 mmol/L (ref 3.5–5.1)
Sodium: 137 mmol/L (ref 135–145)
Total Bilirubin: 1.8 mg/dL — ABNORMAL HIGH (ref 0.3–1.2)
Total Protein: 7 g/dL (ref 6.5–8.1)

## 2015-01-30 LAB — I-STAT CG4 LACTIC ACID, ED: Lactic Acid, Venous: 1.96 mmol/L (ref 0.5–2.0)

## 2015-01-30 LAB — BODY FLUID CELL COUNT WITH DIFFERENTIAL
Eos, Fluid: 0 %
LYMPHS FL: 6 %
Monocyte-Macrophage-Serous Fluid: 86 % (ref 50–90)
Neutrophil Count, Fluid: 8 % (ref 0–25)
WBC FLUID: 387 uL (ref 0–1000)

## 2015-01-30 LAB — LACTATE DEHYDROGENASE, PLEURAL OR PERITONEAL FLUID: LD, Fluid: 46 U/L — ABNORMAL HIGH (ref 3–23)

## 2015-01-30 LAB — CBG MONITORING, ED: Glucose-Capillary: 118 mg/dL — ABNORMAL HIGH (ref 65–99)

## 2015-01-30 LAB — GLUCOSE, SEROUS FLUID: GLUCOSE FL: 144 mg/dL

## 2015-01-30 LAB — ETHANOL

## 2015-01-30 LAB — AMMONIA: Ammonia: 105 umol/L — ABNORMAL HIGH (ref 9–35)

## 2015-01-30 LAB — PROTEIN, BODY FLUID: Total protein, fluid: <3 g/dL

## 2015-01-30 MED ORDER — SODIUM CHLORIDE 0.9 % IV SOLN
1000.0000 mL | Freq: Once | INTRAVENOUS | Status: AC
  Administered 2015-01-30: 1000 mL via INTRAVENOUS

## 2015-01-30 MED ORDER — LACTULOSE 10 GM/15ML PO SOLN
30.0000 g | Freq: Once | ORAL | Status: AC
  Administered 2015-01-30: 30 g via ORAL
  Filled 2015-01-30: qty 45

## 2015-01-30 MED ORDER — SODIUM CHLORIDE 0.9 % IV SOLN: INTRAVENOUS | Status: AC

## 2015-01-30 MED ORDER — GLUCERNA SHAKE PO LIQD
237.0000 mL | Freq: Two times a day (BID) | ORAL | Status: DC
  Administered 2015-01-31: 237 mL via ORAL
  Filled 2015-01-30 (×2): qty 237

## 2015-01-30 MED ORDER — TAMSULOSIN HCL 0.4 MG PO CAPS
0.4000 mg | ORAL_CAPSULE | Freq: Every day | ORAL | Status: DC
  Administered 2015-01-30: 0.4 mg via ORAL
  Filled 2015-01-30: qty 1

## 2015-01-30 MED ORDER — PANTOPRAZOLE SODIUM 40 MG PO TBEC
40.0000 mg | DELAYED_RELEASE_TABLET | Freq: Every day | ORAL | Status: DC
  Administered 2015-01-30 – 2015-01-31 (×2): 40 mg via ORAL
  Filled 2015-01-30: qty 1

## 2015-01-30 MED ORDER — METHOCARBAMOL 500 MG PO TABS
500.0000 mg | ORAL_TABLET | Freq: Four times a day (QID) | ORAL | Status: DC | PRN
Start: 2015-01-30 — End: 2015-01-31

## 2015-01-30 MED ORDER — DEXTROSE 5 % IV SOLN
1.0000 g | INTRAVENOUS | Status: DC
  Administered 2015-01-30: 1 g via INTRAVENOUS
  Filled 2015-01-30 (×2): qty 10

## 2015-01-30 MED ORDER — LACTULOSE 10 GM/15ML PO SOLN
30.0000 g | Freq: Two times a day (BID) | ORAL | Status: DC
  Administered 2015-01-30 – 2015-01-31 (×2): 30 g via ORAL
  Filled 2015-01-30 (×2): qty 60

## 2015-01-30 NOTE — ED Notes (Signed)
Bed: WA08 Expected date:  Expected time:  Means of arrival:  Comments: EMS high ammonia

## 2015-01-30 NOTE — ED Notes (Signed)
Patient's wife reports that patient was confused when he awoke this morning. He has history of such that was secondary to elevated ammonia levels. Patient has not been able to keep lactulose down. He was seen three weeks ago for the same.

## 2015-01-30 NOTE — Procedures (Signed)
US guided diagnostic/therapeutic paracentesis performed yielding 2.5 liters hazy, amber fluid. A portion of the fluid was sent to the lab for preordered studies. No immediate complications.

## 2015-01-30 NOTE — ED Provider Notes (Signed)
CSN: [id number]     Arrival date & time 01/30/15  1206 History   First MD Initiated Contact with Patient 01/30/15 1215     Chief Complaint  Patient presents with  . Altered Mental Status     (Consider location/radiation/quality/duration/timing/severity/associated sxs/prior Treatment) Patient is a 64 y.o. male presenting with altered mental status. The history is provided by the patient.  Altered Mental Status Presenting symptoms: confusion and disorientation   Severity:  Severe Most recent episode:  Today Episode history:  Continuous Duration:  1 day Timing:  Constant Progression:  Unchanged Chronicity:  New Associated symptoms: agitation   Associated symptoms: no abdominal pain, no fever, no headaches, no palpitations, no rash and no vomiting    64 year old male chief complaint altered mental status. Per family this started this morning. Patient with a history of hyper ammonia, patient not taking his lactulose at home.  Family thought he was supposed to stop once his constipation improved. Wife denies fevers chills cough. Denies abdominal pain vomiting. Denies headaches neck pain. Patient with recent Foley removed complaining of pain with urination and frequent urination. Denies flank pain.  Level V caveat limited history due to altered mental status.  Past Medical History  Diagnosis Date  . Reflux esophagitis   . Portal hypertension   . Stricture and stenosis of esophagus   . Diverticulosis of colon (without mention of hemorrhage)   . Internal hemorrhoids without mention of complication   . Benign neoplasm of colon   . Duodenitis without mention of hemorrhage   . 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 Personal history of diseases of blood and blood-forming organs   . 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   Marland Kitchen Unspecified essential hypertension   . Obesity, unspecified   . Type II or unspecified type diabetes mellitus without mention of  complication, not stated as uncontrolled   . GERD (gastroesophageal reflux disease)   . Hemorrhoids   . Colon polyps 2004, 2006, 2008, 11/2011    Adenomatous and tubulovillous adenoma.  . Pancytopenia   . Calculus of kidney yrs ago  . Ascites    Past Surgical History  Procedure Laterality Date  . Kidney stone surgery  yrs ago  . Esophagogastroduodenoscopy  08/01/2011    Procedure: ESOPHAGOGASTRODUODENOSCOPY (EGD);  Surgeon: Scarlette Shorts, MD;  Location: [facility];  Service: Endoscopy;  Laterality: N/A;  . Esophagogastroduodenoscopy N/A 10/27/2012    Procedure: ESOPHAGOGASTRODUODENOSCOPY (EGD);  Surgeon: Inda Castle, MD;  Location: [facility];  Service: Endoscopy;  Laterality: N/A;  . Esophagogastroduodenoscopy N/A 11/22/2012    Procedure: ESOPHAGOGASTRODUODENOSCOPY (EGD);  Surgeon: Irene Shipper, MD;  Location: [facility];  Service: Endoscopy;  Laterality: N/A;  . Colonoscopy    . Upper gastrointestinal endoscopy    . Esophagogastroduodenoscopy N/A 11/29/2013    Procedure: ESOPHAGOGASTRODUODENOSCOPY (EGD);  Surgeon: Irene Shipper, MD;  Location: [facility];  Service: Endoscopy;  Laterality: N/A;  . Esophageal banding Bilateral 11/29/2013    Procedure: ESOPHAGEAL BANDING;  Surgeon: Irene Shipper, MD;  Location: [facility];  Service: Endoscopy;  Laterality: Bilateral;  . Esophagogastroduodenoscopy N/A 06/13/2014    Procedure: ESOPHAGOGASTRODUODENOSCOPY (EGD);  Surgeon: Irene Shipper, MD;  Location: [facility];  Service: Endoscopy;  Laterality: N/A;  . Esophageal banding N/A 06/13/2014    Procedure: ESOPHAGEAL BANDING;  Surgeon: Irene Shipper, MD;  Location: [facility];  Service: Endoscopy;  Laterality: N/A;   Family History  Problem Relation Age of Onset  .  Colon cancer Neg Hx   . Anesthesia problems Neg Hx   . Hypotension Neg Hx   . Malignant hyperthermia Neg Hx   . Pseudochol deficiency Neg Hx   . Diabetes Father   . Heart disease Father   . Aneurysm Mother   . Heart disease  Other     neice  . Diabetes      niece   History  Substance Use Topics  . Smoking status: Never Smoker   . Smokeless tobacco: Never Used  . Alcohol Use: No     Comment: very rare    Review of Systems  Constitutional: Negative for fever and chills.  HENT: Negative for congestion and facial swelling.   Eyes: Negative for discharge and visual disturbance.  Respiratory: Negative for shortness of breath.   Cardiovascular: Negative for chest pain and palpitations.  Gastrointestinal: Negative for vomiting, abdominal pain and diarrhea.  Musculoskeletal: Negative for myalgias and arthralgias.  Skin: Negative for color change and rash.  Neurological: Negative for tremors, syncope and headaches.  Psychiatric/Behavioral: Positive for confusion and agitation. Negative for dysphoric mood.   ROS limited 5 caveat due to altered mental status.   Allergies  Review of patient's allergies indicates no known allergies.  Home Medications   Prior to Admission medications   Medication Sig Start Date End Date Taking? Authorizing Provider  amoxicillin (AMOXIL) 500 MG capsule Take 1 capsule (500 mg total) by mouth 3 (three) times daily. 01/16/15  Yes Truman Hayward, MD  bethanechol (URECHOLINE) 50 MG tablet Take 1 tablet by mouth 2 (two) times daily.  12/19/14  Yes Historical Provider, MD  Dulaglutide 1.5 MG/0.5ML SOPN Inject 1.5 mg into the skin once a week.   Yes Historical Provider, MD  furosemide (LASIX) 20 MG tablet Take 2 tablets (40 mg total) by mouth daily. 12/26/14  Yes Irene Shipper, MD  gabapentin (NEURONTIN) 100 MG capsule Take 100 mg by mouth 3 (three) times daily as needed (May increase to 4 capsules every 8 hours as needed).   Yes Historical Provider, MD  Insulin Degludec (TRESIBA FLEXTOUCH) 200 UNIT/ML SOPN Inject 200 Units into the skin daily.   Yes Historical Provider, MD  lactulose (CEPHULAC) 10 G packet Take 10 g by mouth 3 (three) times daily.   Yes Historical Provider, MD   methocarbamol (ROBAXIN) 500 MG tablet Take 1 tablet (500 mg total) by mouth every 6 (six) hours as needed for muscle spasms. 11/10/14  Yes Ivan Anchors Love, PA-C  pantoprazole (PROTONIX) 40 MG tablet TAKE 1 TABLET (40 MG TOTAL) BY MOUTH DAILY. 11/13/14  Yes Irene Shipper, MD  spironolactone (ALDACTONE) 100 MG tablet Take 1 tablet (100 mg total) by mouth 2 (two) times daily. 12/26/14  Yes Irene Shipper, MD  tamsulosin (FLOMAX) 0.4 MG CAPS capsule Take 1 capsule by mouth daily. Take once daily by mouth 30 minutes after supper 12/19/14  Yes Historical Provider, MD  ENSURE PLUS (ENSURE PLUS) LIQD Take 237 mLs by mouth as needed.    Historical Provider, MD  morphine (MS CONTIN) 15 MG 12 hr tablet Take 1 tablet (15 mg total) by mouth every 12 (twelve) hours. Patient not taking: Reported on 01/30/2015 11/10/14   Ivan Anchors Love, PA-C  Nutritional Supplements (GLUCERNA PO) Take by mouth as needed.    Historical Provider, MD  oxyCODONE (OXY IR/ROXICODONE) 5 MG immediate release tablet Take 1 tablet (5 mg total) by mouth every 4 (four) hours as needed for severe pain. Patient not  taking: Reported on 01/30/2015 11/10/14   Ivan Anchors Love, PA-C  senna-docusate (SENOKOT-S) 8.6-50 MG per tablet Take 2 tablets by mouth at bedtime. If no BM in 24 hours Patient not taking: Reported on 01/30/2015 11/10/14   Ivan Anchors Love, PA-C  simethicone (MYLICON) 80 MG chewable tablet Chew 2 tablets (160 mg total) by mouth 4 (four) times daily as needed for flatulence. For gas/bloating Patient not taking: Reported on 01/30/2015 11/10/14   Ivan Anchors Love, PA-C  Vitamin D, Ergocalciferol, (DRISDOL) 50000 UNITS CAPS capsule Take 1 capsule (50,000 Units total) by mouth every 7 (seven) days. Patient not taking: Reported on 01/30/2015 11/10/14   Ivan Anchors Love, PA-C   BP 114/75 mmHg  Pulse 76  Temp(Src) 97.3 F (36.3 C) (Oral)  Resp 13  SpO2 100% Physical Exam  Constitutional: He appears well-developed and well-nourished.  Jaundiced.  HENT:  Head:  Normocephalic and atraumatic.  Eyes: EOM are normal. Pupils are equal, round, and reactive to light.  Neck: Normal range of motion. Neck supple. No JVD present.  Cardiovascular: Normal rate and regular rhythm.  Exam reveals no gallop and no friction rub.   No murmur heard. Pulmonary/Chest: No respiratory distress. He has no wheezes.  Abdominal: He exhibits distension. There is no rebound and no guarding.  Distended with positive fluid wave. No noted tenderness.  Musculoskeletal: Normal range of motion.  Neurological: He is alert.  Skin: No rash noted. No pallor.  Psychiatric: He has a normal mood and affect. His behavior is normal.    ED Course  Procedures (including critical care time) Labs Review Labs Reviewed  AMMONIA - Abnormal; Notable for the following:    Ammonia 105 (*)    All other components within normal limits  COMPREHENSIVE METABOLIC PANEL - Abnormal; Notable for the following:    Glucose, Bld 154 (*)    BUN 22 (*)    Albumin 3.3 (*)    Total Bilirubin 1.8 (*)    All other components within normal limits  CBC WITH DIFFERENTIAL/PLATELET - Abnormal; Notable for the following:    WBC 3.6 (*)    RBC 3.70 (*)    Hemoglobin 11.1 (*)    HCT 31.9 (*)    RDW 15.6 (*)    Platelets 27 (*)    Lymphs Abs 0.4 (*)    All other components within normal limits  URINALYSIS, ROUTINE W REFLEX MICROSCOPIC (NOT AT [facility]) - Abnormal; Notable for the following:    Color, Urine AMBER (*)    APPearance CLOUDY (*)    Bilirubin Urine SMALL (*)    All other components within normal limits  I-STAT CHEM 8, ED - Abnormal; Notable for the following:    BUN 21 (*)    Glucose, Bld 154 (*)    Hemoglobin 11.2 (*)    HCT 33.0 (*)    All other components within normal limits  CBG MONITORING, ED - Abnormal; Notable for the following:    Glucose-Capillary 118 (*)    All other components within normal limits  URINE CULTURE  ETHANOL  I-STAT CG4 LACTIC ACID, ED    Imaging Review Ct Head Wo  Contrast  01/30/2015   CLINICAL DATA:  64 year old male with episode 1 month ago when patient was not very responsive and confused which lasted for 3 days. Repeat symptoms this morning. Recent cervical spine infection. On antibiotics. Subsequent encounter.  EXAM: CT HEAD WITHOUT CONTRAST  TECHNIQUE: Contiguous axial images were obtained from the base of the skull through  the vertex without intravenous contrast.  COMPARISON:  01/15/2015  FINDINGS: No intracranial hemorrhage.  No CT evidence of large acute infarct.  No intracranial mass lesion noted on this unenhanced exam.  No hydrocephalus.  Radiopaque structure (suggestive of metal) adjacent to the medial aspect of the left globe once again noted.  Mastoid air cells and middle ear cavities as well as visualized paranasal sinuses are clear.  IMPRESSION: No intracranial hemorrhage.  No CT evidence of large acute infarct.  No intracranial mass lesion noted on this unenhanced exam.  Radiopaque structure (suggestive of metal) adjacent to the medial aspect of the left globe once again noted.   Electronically Signed   By: Genia Del M.D.   On: 01/30/2015 13:49   Dg Chest Port 1 View  01/30/2015   CLINICAL DATA:  Lethargic.  Vomiting.  EXAM: PORTABLE CHEST - 1 VIEW  COMPARISON:  January 15, 2015  FINDINGS: The heart size and mediastinal contours are within normal limits. The lung volumes are low. Both lungs are clear. The visualized skeletal structures are stable.  IMPRESSION: No active disease.   Electronically Signed   By: Abelardo Diesel M.D.   On: 01/30/2015 13:02     EKG Interpretation None      MDM   Final diagnoses:  Hyperammonemia  Hepatic encephalopathy  Ascites    64 year old male chief complaint altered mental status. Likely secondary to elevated ammonia levels. Patient not taking his lactulose at home. Likely elevated ammonia levels. CT head negative chest x-ray negative. Chest x-ray viewed by me. In and out urine negative for infection. No  noted leukocytosis repeat abdominal exam without abdominal pain. Feel that SBP is unlikely. Feel that encephalitis or meningitis is unlikely without headache or leukocytosis. Likely mental status abnormality due to elevated ammonia. Will admit to the hospital.     Deno Etienne, DO 01/30/15 1531

## 2015-01-30 NOTE — H&P (Signed)
[facility] History and Physical  [name] CWC:[id number] DOB: 01-26-1951 DOA: 01/30/2015  Referring physician: ED PCP: [name], MD  Specialists: GI  64 y/o h/o followed by 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 dam as well as by [name] on chronic suppressive amoxicillin for at least a year , type 2 diabetes mellitus   He was recently admitted to [facility] 3 weeks ago the records are not available to me but he apparently was disoriented and it was thought that at that point he had a CVA. It turned out to be that he had hepatic encephalopathy. He went home and had persisting nausea vomiting for the past 2 weeks although he has also been keeping down food. His wife gives some of the history as patient is a level V He had and cannot give a history because of some moderate confusion.   He thinks that this is 1999, he cannot tell me the month and he cannot tell me where he is   In either event he'll awoke on the morning of 7/26 and was giving incorrect answers to his wife, she noticed that his pupils were very constricted and because of concern for both of his prior epidural abscess and risk for decompensation from that as well as potential confusion which she has seen before she brought him over to the emergency room.  Emergency room workup significant for ammonia  105,   BUN/creatinine 22/0.8   Random blood glucose 154 LFTs within normal range except total bili 1.8   Hemoglobin up from 0.1 Platelet count 27 WBC 3.6    Past Medical History  Diagnosis Date  . Reflux esophagitis   . Portal hypertension   . Stricture and stenosis of esophagus   . Diverticulosis of colon (without mention of hemorrhage)   . Internal hemorrhoids without mention of complication   . Benign neoplasm of colon   . Duodenitis without mention of hemorrhage   . 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 Personal history of diseases of blood and blood-forming organs   . 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   Marland Kitchen Unspecified essential hypertension   . Obesity, unspecified   . Type II or unspecified type diabetes mellitus without mention of complication, not stated as uncontrolled   . GERD (gastroesophageal reflux disease)   . Hemorrhoids   . Colon polyps 2004, 2006, 2008, 11/2011    Adenomatous and tubulovillous adenoma.  . Pancytopenia   . Calculus of kidney yrs ago  . Ascites    Past Surgical History  Procedure Laterality Date  . Kidney stone surgery  yrs ago  . Esophagogastroduodenoscopy  08/01/2011    Procedure: ESOPHAGOGASTRODUODENOSCOPY (EGD);  Surgeon: Scarlette Shorts, MD;  Location: [facility];  Service: Endoscopy;  Laterality: N/A;  . Esophagogastroduodenoscopy N/A 10/27/2012    Procedure: ESOPHAGOGASTRODUODENOSCOPY (EGD);  Surgeon: Inda Castle, MD;  Location: [facility];  Service: Endoscopy;  Laterality: N/A;  . Esophagogastroduodenoscopy N/A 11/22/2012    Procedure: ESOPHAGOGASTRODUODENOSCOPY (EGD);  Surgeon: Irene Shipper, MD;  Location: [facility];  Service: Endoscopy;  Laterality: N/A;  . Colonoscopy    . Upper gastrointestinal endoscopy    . Esophagogastroduodenoscopy N/A 11/29/2013    Procedure: ESOPHAGOGASTRODUODENOSCOPY (EGD);  Surgeon: Irene Shipper, MD;  Location: [facility];  Service: Endoscopy;  Laterality:  N/A;  . Esophageal banding Bilateral 11/29/2013    Procedure: ESOPHAGEAL BANDING;  Surgeon: Irene Shipper, MD;  Location: [facility];  Service: Endoscopy;  Laterality: Bilateral;  . Esophagogastroduodenoscopy N/A 06/13/2014    Procedure: ESOPHAGOGASTRODUODENOSCOPY (EGD);  Surgeon: Irene Shipper, MD;  Location: [facility];  Service: Endoscopy;  Laterality: N/A;  . Esophageal banding N/A 06/13/2014    Procedure: ESOPHAGEAL BANDING;  Surgeon: Irene Shipper, MD;  Location: [facility];  Service: Endoscopy;  Laterality: N/A;   Social History:  History   Social History Narrative    No Known Allergies  Family History  Problem Relation Age of Onset  . Colon cancer Neg Hx   . Anesthesia problems Neg Hx   . Hypotension Neg Hx   . Malignant hyperthermia Neg Hx   . Pseudochol deficiency Neg Hx   . Diabetes Father   . Heart disease Father   . Aneurysm Mother   . Heart disease Other     neice  . Diabetes      niece    Prior to Admission medications   Medication Sig Start Date End Date Taking? Authorizing Provider  amoxicillin (AMOXIL) 500 MG capsule Take 1 capsule (500 mg total) by mouth 3 (three) times daily. 01/16/15  Yes Truman Hayward, MD  bethanechol (URECHOLINE) 50 MG tablet Take 1 tablet by mouth 2 (two) times daily.  12/19/14  Yes Historical Provider, MD  Dulaglutide 1.5 MG/0.5ML SOPN Inject 1.5 mg into the skin once a week.   Yes Historical Provider, MD  furosemide (LASIX) 20 MG tablet Take 2 tablets (40 mg total) by mouth daily. 12/26/14  Yes Irene Shipper, MD  gabapentin (NEURONTIN) 100 MG capsule Take 100 mg by mouth 3 (three) times daily as needed (May increase to 4 capsules every 8 hours as needed).   Yes Historical Provider, MD  Insulin Degludec (TRESIBA FLEXTOUCH) 200 UNIT/ML SOPN Inject 200 Units into the skin daily.   Yes Historical Provider, MD  lactulose (CEPHULAC) 10 G packet Take 10 g by mouth 3 (three) times daily.   Yes Historical Provider, MD  methocarbamol  (ROBAXIN) 500 MG tablet Take 1 tablet (500 mg total) by mouth every 6 (six) hours as needed for muscle spasms. 11/10/14  Yes Ivan Anchors Love, PA-C  pantoprazole (PROTONIX) 40 MG tablet TAKE 1 TABLET (40 MG TOTAL) BY MOUTH DAILY. 11/13/14  Yes Irene Shipper, MD  spironolactone (ALDACTONE) 100 MG tablet Take 1 tablet (100 mg total) by mouth 2 (two) times daily. 12/26/14  Yes Irene Shipper, MD  tamsulosin (FLOMAX) 0.4 MG CAPS capsule Take 1 capsule by mouth daily. Take once daily by mouth 30 minutes after supper 12/19/14  Yes Historical Provider, MD  ENSURE PLUS (ENSURE PLUS) LIQD Take 237 mLs by mouth as needed.    Historical Provider, MD  morphine (MS CONTIN) 15 MG 12 hr tablet Take 1 tablet (15 mg total) by mouth every 12 (twelve) hours. Patient not taking: Reported on 01/30/2015 11/10/14   Ivan Anchors Love, PA-C  Nutritional Supplements (GLUCERNA PO) Take by mouth as needed.  Historical Provider, MD  oxyCODONE (OXY IR/ROXICODONE) 5 MG immediate release tablet Take 1 tablet (5 mg total) by mouth every 4 (four) hours as needed for severe pain. Patient not taking: Reported on 01/30/2015 11/10/14   Ivan Anchors Love, PA-C  senna-docusate (SENOKOT-S) 8.6-50 MG per tablet Take 2 tablets by mouth at bedtime. If no BM in 24 hours Patient not taking: Reported on 01/30/2015 11/10/14   Ivan Anchors Love, PA-C  simethicone (MYLICON) 80 MG chewable tablet Chew 2 tablets (160 mg total) by mouth 4 (four) times daily as needed for flatulence. For gas/bloating Patient not taking: Reported on 01/30/2015 11/10/14   Ivan Anchors Love, PA-C  Vitamin D, Ergocalciferol, (DRISDOL) 50000 UNITS CAPS capsule Take 1 capsule (50,000 Units total) by mouth every 7 (seven) days. Patient not taking: Reported on 01/30/2015 11/10/14   Bary Leriche, PA-C   Physical Exam: Filed Vitals:   01/30/15 1219 01/30/15 1408  BP: 115/60 114/75  Pulse: 78 76  Temp: 97.3 F (36.3 C)   TempSrc: Oral   Resp: 14 13  SpO2: 100% 100%    Exam limited as patient sitting on  toilet however Confused pleasant, no JVD no bruit S1-S2 no murmur rub or gallop Chest clinically clear No lower extremity edema Abdominal exam deferred until later   Labs on Admission:  Basic Metabolic Panel:  Recent Labs Lab 01/30/15 1300 01/30/15 1316  NA 137 137  K 4.2 4.1  CL 107 104  CO2 23  --   GLUCOSE 154* 154*  BUN 22* 21*  CREATININE 0.85 0.90  CALCIUM 9.2  --    Liver Function Tests:  Recent Labs Lab 01/30/15 1300  AST 27  ALT 20  ALKPHOS 69  BILITOT 1.8*  PROT 7.0  ALBUMIN 3.3*   No results for input(s): LIPASE, AMYLASE in the last 168 hours.  Recent Labs Lab 01/30/15 1300  AMMONIA 105*   CBC:  Recent Labs Lab 01/30/15 1300 01/30/15 1316  WBC 3.6*  --   NEUTROABS 2.8  --   HGB 11.1* 11.2*  HCT 31.9* 33.0*  MCV 86.2  --   PLT 27*  --    Cardiac Enzymes: No results for input(s): CKTOTAL, CKMB, CKMBINDEX, TROPONINI in the last 168 hours.  BNP (last 3 results) No results for input(s): BNP in the last 8760 hours.  ProBNP (last 3 results) No results for input(s): PROBNP in the last 8760 hours.  CBG:  Recent Labs Lab 01/30/15 1314  GLUCAP 118*    Radiological Exams on Admission: Ct Head Wo Contrast  01/30/2015   CLINICAL DATA:  64 year old male with episode 1 month ago when patient was not very responsive and confused which lasted for 3 days. Repeat symptoms this morning. Recent cervical spine infection. On antibiotics. Subsequent encounter.  EXAM: CT HEAD WITHOUT CONTRAST  TECHNIQUE: Contiguous axial images were obtained from the base of the skull through the vertex without intravenous contrast.  COMPARISON:  01/15/2015  FINDINGS: No intracranial hemorrhage.  No CT evidence of large acute infarct.  No intracranial mass lesion noted on this unenhanced exam.  No hydrocephalus.  Radiopaque structure (suggestive of metal) adjacent to the medial aspect of the left globe once again noted.  Mastoid air cells and middle ear cavities as well as  visualized paranasal sinuses are clear.  IMPRESSION: No intracranial hemorrhage.  No CT evidence of large acute infarct.  No intracranial mass lesion noted on this unenhanced exam.  Radiopaque structure (suggestive of metal) adjacent to the medial  aspect of the left globe once again noted.   Electronically Signed   By: Genia Del M.D.   On: 01/30/2015 13:49   Dg Chest Port 1 View  01/30/2015   CLINICAL DATA:  Lethargic.  Vomiting.  EXAM: PORTABLE CHEST - 1 VIEW  COMPARISON:  January 15, 2015  FINDINGS: The heart size and mediastinal contours are within normal limits. The lung volumes are low. Both lungs are clear. The visualized skeletal structures are stable.  IMPRESSION: No active disease.   Electronically Signed   By: Abelardo Diesel M.D.   On: 01/30/2015 13:02    EKG: Independently reviewed. None perfomred  Assessment/Plan Principal Problem:   Acute hepatic encephalopathy Active Problems:   Pancytopenia   Hepatic cirrhosis   Bleeding esophageal varices   Diabetes mellitus type 2, uncontrolled   Group B streptococcal infection   Hyperammonemia   Decompensated cirrhosis with hepatic encephalopathy,? SBP, last meld score of 17 -Patient has just had a tap on 7/20   -Wbc's only 429, no 34 lymphocytes,ppearance was cloudy but there were no other cells that were present. - I think in this case it is imperative to do as he is also on suppressive amoxicillin for his epidural abscess C3-C6 and I will order this for the morning -There is some evidence for rifaximin and family expressed interest in that so I will ask gastroenterology to weigh in as it appears that [name] was supposed to get screening labs on him over the past couple of days -I've held his prior to admission doses of Aldactone 200, Lasix 40  History C3-C6 epidural abscess/discitis on suppressive amoxicillin -We will replace amoxicillin with IV Rocephin for now 1 g every 24 which should offer some intra-abdominal coverage in case  there is SBP  Diabetes mellitus type 2 -Hold all other types of insulin -Keep on 4 times a day before meals and at bedtime coverage if he is safe to eat -hold gabapentin as well as methocarbamol for now -Monitor  BPH -Continue Flomax 0.4 daily  Chronic pain -hold morphine 15 every 12 as well as Percocet  Med-surg Full code Long discussion with wife Mickeal Needy [facility] Pager [phone]  If 7PM-7AM, please contact night-coverage [email or web address] Password [facility] 01/30/2015, 3:42 PM

## 2015-01-30 NOTE — ED Notes (Signed)
Patient had BM all over room floor. Patient taken to shower for clean up.

## 2015-01-30 NOTE — ED Notes (Signed)
Patient transported to Ultrasound 

## 2015-01-31 DIAGNOSIS — B951 Streptococcus, group B, as the cause of diseases classified elsewhere: Secondary | ICD-10-CM

## 2015-01-31 DIAGNOSIS — K7290 Hepatic failure, unspecified without coma: Secondary | ICD-10-CM

## 2015-01-31 DIAGNOSIS — E1165 Type 2 diabetes mellitus with hyperglycemia: Secondary | ICD-10-CM

## 2015-01-31 DIAGNOSIS — I8501 Esophageal varices with bleeding: Secondary | ICD-10-CM

## 2015-01-31 LAB — COMPREHENSIVE METABOLIC PANEL
ALK PHOS: 65 U/L (ref 38–126)
ALT: 22 U/L (ref 17–63)
AST: 27 U/L (ref 15–41)
Albumin: 3.1 g/dL — ABNORMAL LOW (ref 3.5–5.0)
Anion gap: 8 (ref 5–15)
BUN: 20 mg/dL (ref 6–20)
CALCIUM: 8.9 mg/dL (ref 8.9–10.3)
CHLORIDE: 107 mmol/L (ref 101–111)
CO2: 21 mmol/L — AB (ref 22–32)
Creatinine, Ser: 0.91 mg/dL (ref 0.61–1.24)
GFR calc Af Amer: >60 mL/min (ref >60)
GFR calc non Af Amer: >60 mL/min (ref >60)
Glucose, Bld: 126 mg/dL — ABNORMAL HIGH (ref 65–99)
Potassium: 3.8 mmol/L (ref 3.5–5.1)
SODIUM: 136 mmol/L (ref 135–145)
Total Bilirubin: 1.8 mg/dL — ABNORMAL HIGH (ref 0.3–1.2)
Total Protein: 6.6 g/dL (ref 6.5–8.1)

## 2015-01-31 LAB — CBC
HEMATOCRIT: 31.7 % — AB (ref 39.0–52.0)
HEMOGLOBIN: 11.2 g/dL — AB (ref 13.0–17.0)
MCH: 30.4 pg (ref 26.0–34.0)
MCHC: 35.3 g/dL (ref 30.0–36.0)
MCV: 85.9 fL (ref 78.0–100.0)
Platelets: 32 10*3/uL — ABNORMAL LOW (ref 150–400)
RBC: 3.69 MIL/uL — AB (ref 4.22–5.81)
RDW: 15.5 % (ref 11.5–15.5)
WBC: 3.3 10*3/uL — ABNORMAL LOW (ref 4.0–10.5)

## 2015-01-31 LAB — PH, BODY FLUID: PH, FLUID: 7.5

## 2015-01-31 LAB — GRAM STAIN

## 2015-01-31 LAB — PROTIME-INR
INR: 1.44 (ref 0.00–1.49)
Prothrombin Time: 17.6 seconds — ABNORMAL HIGH (ref 11.6–15.2)

## 2015-01-31 MED ORDER — ONDANSETRON HCL 4 MG PO TABS: 4.0000 mg | ORAL_TABLET | Freq: Three times a day (TID) | ORAL | Status: DC | PRN

## 2015-01-31 MED ORDER — LACTULOSE 10 GM/15ML PO SOLN: 30.0000 g | Freq: Three times a day (TID) | ORAL | Status: DC

## 2015-01-31 MED ORDER — RIFAXIMIN 550 MG PO TABS: 550.0000 mg | ORAL_TABLET | Freq: Two times a day (BID) | ORAL | Status: DC

## 2015-01-31 MED ORDER — SPIRONOLACTONE 25 MG PO TABS: 100.0000 mg | ORAL_TABLET | Freq: Two times a day (BID) | ORAL | Status: DC

## 2015-01-31 MED ORDER — FUROSEMIDE 40 MG PO TABS: 40.0000 mg | ORAL_TABLET | Freq: Every day | ORAL | Status: DC

## 2015-01-31 MED ORDER — RIFAXIMIN 550 MG PO TABS
550.0000 mg | ORAL_TABLET | Freq: Two times a day (BID) | ORAL | Status: DC
  Administered 2015-01-31: 550 mg via ORAL
  Filled 2015-01-31 (×2): qty 1

## 2015-01-31 NOTE — Discharge Summary (Signed)
Physician Discharge Summary  [name] ASN:[id number] DOB: [birth year] DOA: 01/30/2015  PCP: [name], MD  Admit date: 01/30/2015 Discharge date: 01/31/2015  Recommendations for Outpatient Follow-up:  1. Start rifaximin in addition to lactulose 2. F/u with gastroenterology in 1-2 weeks, [name]   Discharge Diagnoses:  Principal Problem:   Acute hepatic encephalopathy Active Problems:   Pancytopenia   Hepatic cirrhosis   Bleeding esophageal varices   Diabetes mellitus type 2, uncontrolled   Group B streptococcal infection   Hyperammonemia   Discharge Condition: stable, improved  Diet recommendation: low sodium  Wt Readings from Last 3 Encounters:  01/30/15 71.305 kg (157 lb 3.2 oz)  01/24/15 79.833 kg (176 lb)  01/16/15 77.565 kg (171 lb)    History of present illness:   64 y/o h/o followed by [name] for NASH + cirrhosis for at least the past 10 years status post multiple endoscopies and ligations as well as bleeding--last upper endoscopy with ligation 11/2013 and due for another 03/2015-followed also peripherally [name] 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 dam as well as by [name] on chronic suppressive amoxicillin for at least a year, type 2 diabetes mellitus.  He was recently admitted to [facility] 3 weeks ago with hepatic encephalopathy. He went home and had persisting nausea vomiting.  He states he started taking some over the counter mylanta due to heartburn and nausea over the last few days but had been compliant with his lactulose.  He takes it three times a day and has  watery stools 5-7 times per day.  Emergency room workup significant for ammonia 105.    Hospital Course:   Decompensated cirrhosis with hepatic encephalopathy, last meld score of 17.  He had no signs of underlying infection.  He underwent paracentesis with removal of 2.5L of fluid which was negative for SBP.  He was continued on lactulose and had improvement in his mentation and after discussion with [name] from GI, he was also started on rifaximin which he will continue at discharge.  He will f/u with [name] in 1-2 weeks for reevaluation.  Although he was hydrated, he diuretics were resumed at discharge and he was given zofran to use as prior to meals and medications to help with his nausea.    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    Diabetes mellitus type 2, stable, resume home medications.    BPH, stable, continue Flomax 0.4 daily  Chronic pain, stable, continue morphine 15 every 12 as well as Percocet  Procedures:  Paracentesis 7/26 by IR  Consultations:  IR  Discharge Exam: Filed Vitals:   01/31/15 0454  BP: 112/67  Pulse: 87  Temp: 99 F (37.2 C)  Resp:    Filed Vitals:   01/30/15 1706 01/30/15 1750 01/30/15 2103 01/31/15 0454  BP: 127/73 137/77 126/71 112/67  Pulse: 71 76 76 87  Temp:  97.5 F (36.4 C) 98.1 F (36.7 C) 99 F (37.2 C)  TempSrc:  Oral Oral Oral  Resp: 11     Height:  5\' 10"  (1.778 m)    Weight:  71.305 kg (157 lb 3.2 oz)    SpO2: 99% 100%  100% 100%    General: adult male, NAD Cardiovascular: RRR, no mrg, 2+ pulses Respiratory: CTAB ABD:  NABS, soft, mildly distended, NT MSK:  No LEE Neuro:  Awake, alert, mildly slowed speech.    Discharge Instructions      Discharge Instructions    Call MD for:  difficulty breathing, headache or visual disturbances    Complete by:  As directed      Call MD for:  extreme fatigue    Complete by:  As directed      Call MD for:  hives    Complete by:  As directed      Call  MD for:  persistant dizziness or light-headedness    Complete by:  As directed      Call MD for:  persistant nausea and vomiting    Complete by:  As directed      Call MD for:  redness, tenderness, or signs of infection (pain, swelling, redness, odor or green/yellow discharge around incision site)    Complete by:  As directed      Call MD for:  severe uncontrolled pain    Complete by:  As directed      Call MD for:  temperature >100.4    Complete by:  As directed      Diet - low sodium heart healthy    Complete by:  As directed      Discharge instructions    Complete by:  As directed   You were hospitalized with confusion from liver disease.  Please continue to take your lactulose, but I have added rifaximin to your medication list.  Please take rifaximin twice a day and follow up with [name] in 1-2 weeks.  Please call to schedule an appointment as soon as possible.  If you feel you are becoming dehydrated, it is okay to stop your lasix and spironolactone and call [name] Media office right away.  Take zofran before meals/medications to prevent nausea.     Increase activity slowly    Complete by:  As directed             Medication List    STOP taking these medications        methocarbamol 500 MG tablet  Commonly known as:  ROBAXIN     morphine 15 MG 12 hr tablet  Commonly known as:  MS CONTIN     oxyCODONE 5 MG immediate release tablet  Commonly known as:  Oxy IR/ROXICODONE     senna-docusate 8.6-50 MG per tablet  Commonly known as:  Senokot-S     simethicone 80 MG chewable tablet  Commonly known as:  MYLICON     Vitamin D (Ergocalciferol) 50000 UNITS Caps capsule  Commonly known as:  DRISDOL      TAKE these medications        amoxicillin 500 MG capsule  Commonly known as:  AMOXIL  Take 1 capsule (500 mg total) by mouth 3 (three) times daily.     bethanechol 50 MG tablet  Commonly known as:  URECHOLINE  Take 1 tablet by mouth 2 (two) times daily.      Dulaglutide 1.5 MG/0.5ML Sopn  Inject 1.5 mg into the skin once a week.     furosemide 20 MG tablet  Commonly known as:  LASIX  Take 2 tablets (40 mg total) by mouth daily.     gabapentin 100 MG capsule  Commonly known as:  NEURONTIN  Take 100 mg by mouth 3 (three)  times daily as needed (May increase to 4 capsules every 8 hours as needed).     GLUCERNA PO  Take by mouth as needed.     ENSURE PLUS Liqd  Take 237 mLs by mouth as needed.     lactulose 10 G packet  Commonly known as:  CEPHULAC  Take 10 g by mouth 3 (three) times daily.     ondansetron 4 MG tablet  Commonly known as:  ZOFRAN  Take 1 tablet (4 mg total) by mouth every 8 (eight) hours as needed for nausea or vomiting.     pantoprazole 40 MG tablet  Commonly known as:  PROTONIX  TAKE 1 TABLET (40 MG TOTAL) BY MOUTH DAILY.     rifaximin 550 MG Tabs tablet  Commonly known as:  XIFAXAN  Take 1 tablet (550 mg total) by mouth 2 (two) times daily.     spironolactone 100 MG tablet  Commonly known as:  ALDACTONE  Take 1 tablet (100 mg total) by mouth 2 (two) times daily.     tamsulosin 0.4 MG Caps capsule  Commonly known as:  FLOMAX  Take 1 capsule by mouth daily. Take once daily by mouth 30 minutes after supper     TRESIBA FLEXTOUCH 200 UNIT/ML Sopn  Generic drug:  Insulin Degludec  Inject 200 Units into the skin daily.       Follow-up Information    Follow up with [name], MD. Schedule an appointment as soon as possible for a visit in 1 month.   Specialty:  Family Medicine   Contact information:   [place] [address] [phone]       Follow up with Scarlette Shorts, MD. Schedule an appointment as soon as possible for a visit in 1 week.   Specialty:  Gastroenterology   Contact information:   [address] [place] [address] [phone]        The results of significant diagnostics from this hospitalization (including imaging, microbiology, ancillary and  laboratory) are listed below for reference.    Significant Diagnostic Studies: Ct Head Wo Contrast  01/30/2015   CLINICAL DATA:  64 year old male with episode 1 month ago when patient was not very responsive and confused which lasted for 3 days. Repeat symptoms this morning. Recent cervical spine infection. On antibiotics. Subsequent encounter.  EXAM: CT HEAD WITHOUT CONTRAST  TECHNIQUE: Contiguous axial images were obtained from the base of the skull through the vertex without intravenous contrast.  COMPARISON:  01/15/2015  FINDINGS: No intracranial hemorrhage.  No CT evidence of large acute infarct.  No intracranial mass lesion noted on this unenhanced exam.  No hydrocephalus.  Radiopaque structure (suggestive of metal) adjacent to the medial aspect of the left globe once again noted.  Mastoid air cells and middle ear cavities as well as visualized paranasal sinuses are clear.  IMPRESSION: No intracranial hemorrhage.  No CT evidence of large acute infarct.  No intracranial mass lesion noted on this unenhanced exam.  Radiopaque structure (suggestive of metal) adjacent to the medial aspect of the left globe once again noted.   Electronically Signed   By: Genia Del M.D.   On: 01/30/2015 13:49   US Paracentesis  01/30/2015   INDICATION: Cirrhosis, recurrent ascites. Request is made for diagnostic and therapeutic paracentesis.  EXAM: ULTRASOUND-GUIDED DIAGNOSTIC AND THERAPEUTIC PARACENTESIS  COMPARISON:  Prior paracentesis on 01/24/2015  MEDICATIONS: None.  COMPLICATIONS: None immediate  TECHNIQUE: Informed written consent was obtained from the patient after  a discussion of the risks, benefits and alternatives to treatment. A timeout was performed prior to the initiation of the procedure.  Initial ultrasound scanning demonstrates a small to moderate amount of ascites within the left lower abdominal quadrant. The left lower abdomen was prepped and draped in the usual sterile fashion. 1% lidocaine was used for  local anesthesia. Under direct ultrasound guidance, a 19 gauge, 10-cm, Yueh catheter was introduced. An ultrasound image was saved for documentation purposed. The paracentesis was performed. The catheter was removed and a dressing was applied. The patient tolerated the procedure well without immediate post procedural complication.  FINDINGS: A total of approximately 2.5 liters of hazy, amber fluid was removed. Samples were sent to the laboratory as requested by the clinical team.  IMPRESSION: Successful ultrasound-guided diagnostic and therapeutic paracentesis yielding 2.5 liters of peritoneal fluid.  Read by: Rowe Robert, PA-C   Electronically Signed   By: Corrie Mckusick D.O.   On: 01/30/2015 16:53   US Paracentesis  01/24/2015   CLINICAL DATA:  Ascites secondary to NASH cirrhosis  EXAM: ULTRASOUND GUIDED RIGHT LOWER QUADRANT PARACENTESIS  COMPARISON:  None.  PROCEDURE: An ultrasound guided paracentesis was thoroughly discussed with the patient and questions answered. The benefits, risks, alternatives and complications were also discussed. The patient understands and wishes to proceed with the procedure. Written consent was obtained.  Ultrasound was performed to localize and mark an adequate pocket of fluid in the right lower quadrant of the abdomen. The area was then prepped and draped in the normal sterile fashion. 1% Lidocaine was used for local anesthesia. Under ultrasound guidance a 19 gauge Yueh catheter was introduced. Paracentesis was performed. The catheter was removed and a dressing applied.  COMPLICATIONS: None.  FINDINGS: A total of approximately 3.8 liters of blood tinged fluid was removed. A fluid sample was sent for laboratory analysis.  IMPRESSION: Successful ultrasound guided paracentesis yielding 3.8 liters of ascites.  Read by:  Gareth Eagle, PA-C   Electronically Signed   By: Sandi Mariscal M.D.   On: 01/24/2015 14:22   Dg Chest Port 1 View  01/30/2015   CLINICAL DATA:  Lethargic.  Vomiting.   EXAM: PORTABLE CHEST - 1 VIEW  COMPARISON:  January 15, 2015  FINDINGS: The heart size and mediastinal contours are within normal limits. The lung volumes are low. Both lungs are clear. The visualized skeletal structures are stable.  IMPRESSION: No active disease.   Electronically Signed   By: Abelardo Diesel M.D.   On: 01/30/2015 13:02    Microbiology: Recent Results (from the past 240 hour(s))  Urine culture     Status: None (Preliminary result)   Collection Time: 01/30/15  2:11 PM  Result Value Ref Range Status   Specimen Description URINE, CATHETERIZED  Final   Special Requests NONE  Final   Culture   Final    NO GROWTH < 24 HOURS Performed at [facility]    Report Status PENDING  Incomplete  Gram stain     Status: None (Preliminary result)   Collection Time: 01/30/15  5:18 PM  Result Value Ref Range Status   Specimen Description FLUID ABDOMEN  Final   Special Requests ASCITIC  Final   Gram Stain   Final    CYTOSPIN SLIDE WBC PRESENT,BOTH PMN AND MONONUCLEAR NO ORGANISMS SEEN Performed at [facility]    Report Status PENDING  Incomplete  Anaerobic culture     Status: None (Preliminary result)   Collection Time: 01/30/15  5:18  PM  Result Value Ref Range Status   Specimen Description ABDOMEN FLUID  Final   Special Requests ADDED 0456 01/31/15  Final   Gram Stain PENDING  Incomplete   Culture PENDING  Incomplete   Report Status PENDING  Incomplete     Labs: Basic Metabolic Panel:  Recent Labs Lab 01/30/15 1300 01/30/15 1316 01/31/15 0444  NA 137 137 136  K 4.2 4.1 3.8  CL 107 104 107  CO2 23  --  21*  GLUCOSE 154* 154* 126*  BUN 22* 21* 20  CREATININE 0.85 0.90 0.91  CALCIUM 9.2  --  8.9   Liver Function Tests:  Recent Labs Lab 01/30/15 1300 01/31/15 0444  AST 27 27  ALT 20 22  ALKPHOS 69 65  BILITOT 1.8* 1.8*  PROT 7.0 6.6  ALBUMIN 3.3* 3.1*   No results for input(s): LIPASE, AMYLASE in the last 168 hours.  Recent Labs Lab  01/30/15 1300  AMMONIA 105*   CBC:  Recent Labs Lab 01/30/15 1300 01/30/15 1316 01/31/15 0444  WBC 3.6*  --  3.3*  NEUTROABS 2.8  --   --   HGB 11.1* 11.2* 11.2*  HCT 31.9* 33.0* 31.7*  MCV 86.2  --  85.9  PLT 27*  --  32*   Cardiac Enzymes: No results for input(s): CKTOTAL, CKMB, CKMBINDEX, TROPONINI in the last 168 hours. BNP: BNP (last 3 results) No results for input(s): BNP in the last 8760 hours.  ProBNP (last 3 results) No results for input(s): PROBNP in the last 8760 hours.  CBG:  Recent Labs Lab 01/30/15 1314  GLUCAP 118*    Time coordinating discharge: 35 minutes  Signed:  Lotus Santillo  [facility] 01/31/2015, 2:27 PM

## 2015-01-31 NOTE — Progress Notes (Signed)
Pt is covered 100% at [name] for Xifaxan medication. Pt's wife was made given this information.

## 2015-02-01 ENCOUNTER — Telehealth: Payer: Self-pay | Admitting: Internal Medicine

## 2015-02-01 ENCOUNTER — Other Ambulatory Visit: Payer: Self-pay | Admitting: Internal Medicine

## 2015-02-01 LAB — URINE CULTURE: Culture: NO GROWTH

## 2015-02-01 NOTE — Telephone Encounter (Signed)
Left message for pt to call back.  Pt seen in ER recently for elevated ammonia. Pt told to follow-up with GI in 1-2 weeks. Pt scheduled to see Alonza Bogus PA 02/08/15@1 :30pm. Pt aware of appt.

## 2015-02-04 LAB — CULTURE, BODY FLUID-BOTTLE: Culture: NO GROWTH

## 2015-02-04 LAB — CULTURE, BODY FLUID W GRAM STAIN -BOTTLE

## 2015-02-05 ENCOUNTER — Telehealth: Payer: Self-pay

## 2015-02-05 MED ORDER — SPIRONOLACTONE 100 MG PO TABS: 100.0000 mg | ORAL_TABLET | Freq: Two times a day (BID) | ORAL | Status: DC

## 2015-02-05 NOTE — Telephone Encounter (Signed)
Refilled Aldactone in a 90 day form

## 2015-02-06 LAB — ANAEROBIC CULTURE

## 2015-02-08 ENCOUNTER — Encounter: Payer: Self-pay | Admitting: Gastroenterology

## 2015-02-08 ENCOUNTER — Ambulatory Visit (INDEPENDENT_AMBULATORY_CARE_PROVIDER_SITE_OTHER): Payer: Managed Care, Other (non HMO) | Admitting: Gastroenterology

## 2015-02-08 VITALS — BP 100/74 | HR 100 | Ht 70.0 in | Wt 174.5 lb

## 2015-02-08 DIAGNOSIS — K7290 Hepatic failure, unspecified without coma: Secondary | ICD-10-CM | POA: Diagnosis not present

## 2015-02-08 DIAGNOSIS — K7460 Unspecified cirrhosis of liver: Secondary | ICD-10-CM

## 2015-02-08 DIAGNOSIS — K7682 Hepatic encephalopathy: Secondary | ICD-10-CM

## 2015-02-08 DIAGNOSIS — R188 Other ascites: Secondary | ICD-10-CM

## 2015-02-08 NOTE — Progress Notes (Signed)
02/08/2015 [name] [id number] [birth year]   History of Present Illness:  This is a 64 year old male who is known to [name].  His history is outlined well by [name] in a recent office note from 6/29.  Since his his last visit he was hospitalized for hepatic encephalopathy.  Ammonia was 105.  He had already been back on his lactulose so was placed on Xifaxan 550 mg BID as well during his recent hospital admission.  He also underwent paracentesis on 7/26 with 2.5 Liters of fluid removed, negative for SBP.  He is currently on lasix 40 mg daily and spironolactone 200 mg daily.  His weight today is 174 pounds, up from 162 pounds at his visit with [name] on 6/29.  Feels like his is retaining more fluid in his abdomen, but he is not uncomfortable or short of breath.  He was supposed to have an appointment with [facility] later this month, however, it was rescheduled for November.  I have asked [name] to contact the liver clinic and explain his recent issues and decompensation to see if his appointment can be moved up sooner.   Current Medications, Allergies, Past Medical History, Past Surgical History, Family History and Social History were reviewed in [employer] record.   Physical Exam: BP 100/74 mmHg  Pulse 100  Ht 5\' 10"  (1.778 m)  Wt 174 lb 8 oz (79.153 kg)  BMI 25.04 kg/m2 General:  Chronically ill-appearing white male in no acute distress; muscle wasting noted. Head: Normocephalic and atraumatic Eyes:  Sclerae anicteric, conjunctiva pink  Ears: Normal auditory acuity Lungs: Clear throughout to auscultation Heart: Regular rate and rhythm Abdomen: Somewhat distended from ascites fluid, but soft.  Normal bowel sounds.  Non-tender. Musculoskeletal: Symmetrical with no gross deformities  Extremities: No edema  Neurological: Alert oriented x 4, grossly non-focal.  no  Psychological:  Alert and cooperative. Normal mood and  affect  Assessment and Recommendations: #1. Nash cirrhosis. Decompensated in the face of recent acute illness (epidural abscess) #2. Ascites and volume overload #3.  Hepatic encephalopathy.  Now on lactulose and xifaxan #4. History of bleeding varices. Last endoscopy December 2015 as described. 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 History of advanced colonic neoplasia. Last colonoscopy September 2013 #7. GERD. Asymptomatic on PPI #8. Multiple medical problems. Ongoing   -Will increase lasix to 60 mg daily.  Spironolactone will remain the same at 200 mg daily.  Will monitor daily weights at home.  Check BMP next week.  Will hold off on paracentesis for now. -Continue lactulose 10 grams BID-TID (titrated as need to 3-4 soft stools daily) and Xifaxan 550 mg BID. -Due for surveillance colonoscopy around September 2016. Indefinitely on hold given current medical problems -Due for surveillance upper endoscopy around December 2016. On hold indefinitely given acute medical problems -Ongoing follow-up at [facility] liver clinic.  Had an appointment later this month, however, it was just rescheduled to November.  I have asked them to call there and make the office aware of his recent issues/decompensation and try to get his appointment moved back up sooner. -GI office follow-up with [name] already scheduled for September. -Ongoing general medical care with multiple specialists and PCP  30 minutes spent with patient face-to-face. Greater than 50% of the time spent counseling giving his multiple significant problems as listed above and plans as outlined. Multiple questions from patient and Burch  also answered to their satisfaction

## 2015-02-08 NOTE — Patient Instructions (Signed)
Return to our lab in the basement of our building Thursday August 11th for repeat lab work.  Increase Lasix to 60mg  daily.

## 2015-02-11 NOTE — Progress Notes (Signed)
Reviewed and agree.

## 2015-02-16 ENCOUNTER — Other Ambulatory Visit (INDEPENDENT_AMBULATORY_CARE_PROVIDER_SITE_OTHER): Payer: Managed Care, Other (non HMO)

## 2015-02-16 ENCOUNTER — Telehealth: Payer: Self-pay

## 2015-02-16 DIAGNOSIS — K7460 Unspecified cirrhosis of liver: Secondary | ICD-10-CM | POA: Diagnosis not present

## 2015-02-16 DIAGNOSIS — K7469 Other cirrhosis of liver: Secondary | ICD-10-CM | POA: Diagnosis not present

## 2015-02-16 DIAGNOSIS — R188 Other ascites: Secondary | ICD-10-CM

## 2015-02-16 LAB — BASIC METABOLIC PANEL
BUN: 19 mg/dL (ref 6–23)
CALCIUM: 9.2 mg/dL (ref 8.4–10.5)
CO2: 28 mEq/L (ref 19–32)
Chloride: 99 mEq/L (ref 96–112)
Creatinine, Ser: 1.19 mg/dL (ref 0.40–1.50)
GFR: 65.74 mL/min (ref >60.00)
GLUCOSE: 151 mg/dL — AB (ref 70–99)
POTASSIUM: 4.1 meq/L (ref 3.5–5.1)
Sodium: 133 mEq/L — ABNORMAL LOW (ref 135–145)

## 2015-02-16 LAB — AMMONIA: Ammonia: 104 umol/L — ABNORMAL HIGH (ref 11–35)

## 2015-02-16 NOTE — Telephone Encounter (Signed)
I have left message for the patient to call back 

## 2015-02-16 NOTE — Telephone Encounter (Signed)
Did they increase his Lasix to 60 mg daily?  If so then continue on that dose for now because kidney function and electrolytes are ok for now.  Please order paracentesis with fluid sent for cell count and culture.  He needs to see about getting seen again soon at [facility].  Is he taking the lactulose?  How many times per day?  Is he having 3-4 soft to loose stools daily with it?  Thank you,  Jess

## 2015-02-16 NOTE — Telephone Encounter (Signed)
Patient walks in with his spouse complaining his abdominal girth has increased. He denies feeling SOB, states he is sleeping okay, but does feel his legs are heavier. His spouse wrote me a note saying his memory is bad. Patient had labs drawn today. Ammonia is 104. He doesn't know anything new on his [facility] appointment. He wanted to see about getting a paracentesis done soon. Please advise. Mrs. Harbour's cell is [phone].

## 2015-02-19 ENCOUNTER — Other Ambulatory Visit: Payer: Self-pay

## 2015-02-19 DIAGNOSIS — R188 Other ascites: Secondary | ICD-10-CM

## 2015-02-19 DIAGNOSIS — K7031 Alcoholic cirrhosis of liver with ascites: Secondary | ICD-10-CM

## 2015-02-19 MED ORDER — LACTULOSE 10 GM/15ML PO SOLN: 10.0000 g | Freq: Four times a day (QID) | ORAL | Status: DC

## 2015-02-19 NOTE — Telephone Encounter (Signed)
called patient's cell phone. He gives the phone to his spouse. Instructed on increase of Chronulac. New prescription sent to the pharmacy.

## 2015-02-19 NOTE — Telephone Encounter (Signed)
Doc of the Day- Cirrhosis with elevated ammonia. Most recent level 02/16/15 is 104. It was 105 on 01/30/15. He was discharged 01/31/15 on Lactulose 30 ml daily in divided doses and Xifaxin 550 mg BID was added. The wife reports he is no better than he was, have "bad memory" and is "slow". He is scheduled for u/s paracentesis on 02/22/15. I spoke with the patient who reports he "just about constipated." Are there any changes in his medications we can do?

## 2015-02-19 NOTE — Telephone Encounter (Signed)
Left another message for Interventional Radiology to call to schedule the patient.

## 2015-02-19 NOTE — Telephone Encounter (Signed)
Signs of hepatic encephalopathy in patient with known cirrhosis and portal hypertension Increase lactulose to 4-5 times today in order to begin having bowel movements. Goal is 3-4 soft but formed stools daily. Want to avoid constipation as constipation can exacerbate hepatic encephalopathy If mental status worsens needs to be taken to the ER Continue rifaximin 550 mg twice a day

## 2015-02-19 NOTE — Telephone Encounter (Signed)
Spoke with spouse. She confirms the medications are correct. Orders for paracentesis put into [facility]. Message left for IR requesting an appointment.

## 2015-02-21 NOTE — Telephone Encounter (Signed)
Labs, procedure notes and radiology faxed to [phone]

## 2015-02-22 ENCOUNTER — Ambulatory Visit (HOSPITAL_COMMUNITY)
Admission: RE | Admit: 2015-02-22 | Discharge: 2015-02-22 | Disposition: A | Discharge status: Home / Self Care | Payer: Managed Care, Other (non HMO) | Source: Ambulatory Visit | Attending: Internal Medicine | Admitting: Internal Medicine

## 2015-02-22 DIAGNOSIS — R188 Other ascites: Secondary | ICD-10-CM | POA: Insufficient documentation

## 2015-02-22 DIAGNOSIS — K7460 Unspecified cirrhosis of liver: Secondary | ICD-10-CM | POA: Insufficient documentation

## 2015-02-22 LAB — BODY FLUID CELL COUNT WITH DIFFERENTIAL
LYMPHS FL: 47 %
MONOCYTE-MACROPHAGE-SEROUS FLUID: 50 % (ref 50–90)
Neutrophil Count, Fluid: 3 % (ref 0–25)
WBC FLUID: 149 uL (ref 0–1000)

## 2015-02-22 LAB — GRAM STAIN

## 2015-02-22 MED ORDER — ALBUMIN HUMAN 25 % IV SOLN
INTRAVENOUS | Status: AC
  Filled 2015-02-22: qty 50

## 2015-02-22 MED ORDER — ALBUMIN HUMAN 25 % IV SOLN
62.5000 g | Freq: Once | INTRAVENOUS | Status: AC
  Administered 2015-02-22: 62.5 g via INTRAVENOUS
  Filled 2015-02-22: qty 300

## 2015-02-22 MED ORDER — SODIUM CHLORIDE 0.9 % IV SOLN: INTRAVENOUS | Status: DC

## 2015-02-22 MED ORDER — ALBUMIN HUMAN 25 % IV SOLN
INTRAVENOUS | Status: AC
  Filled 2015-02-22: qty 250

## 2015-02-23 LAB — PATHOLOGIST SMEAR REVIEW

## 2015-02-26 ENCOUNTER — Telehealth: Payer: Self-pay | Admitting: Internal Medicine

## 2015-02-26 MED ORDER — RIFAXIMIN 550 MG PO TABS: 550.0000 mg | ORAL_TABLET | Freq: Two times a day (BID) | ORAL | Status: DC

## 2015-02-26 MED ORDER — LACTULOSE 10 GM/15ML PO SOLN: 10.0000 g | Freq: Four times a day (QID) | ORAL | Status: DC

## 2015-02-26 NOTE — Telephone Encounter (Signed)
Refilled Xifaxan.  Chronulac was sent to pharmacy 8/15 by [facility] but resent it just in case it was not received by the pharmacy

## 2015-02-27 ENCOUNTER — Other Ambulatory Visit: Payer: Self-pay

## 2015-02-27 ENCOUNTER — Telehealth: Payer: Self-pay

## 2015-02-27 DIAGNOSIS — R188 Other ascites: Secondary | ICD-10-CM

## 2015-02-27 LAB — CULTURE, BODY FLUID-BOTTLE

## 2015-02-27 LAB — CULTURE, BODY FLUID W GRAM STAIN -BOTTLE: Culture: NO GROWTH

## 2015-02-27 NOTE — Telephone Encounter (Signed)
The patient and his wife come by the office. The patient wants to be scheduled for a paracentesis. He states the last one made a considerable difference but he feels he is back to the same issue. He complains of abdominal distention and discomfort. Please advise.

## 2015-02-27 NOTE — Telephone Encounter (Signed)
Pt scheduled for [place] para at [facility] 03/01/15@11am , pt to arrive there at 10:45am. Pt to receive Albumin 8gm IV for each liter of fluid removed. Max of 8 liters to be withdrawn. Left message for pt to call back.  Spoke with pts wife and she is aware of appt date and time and knows to keep track of daily wts at the same time daily and the sodium restricted diet. States he is currently taking Lasix 40mg  daily and Aldactone 200mg  daily.

## 2015-02-27 NOTE — Telephone Encounter (Signed)
1. Confirm his current diuretic regimen. Name and dosage. 2. Make sure he is on sodium restriction. 2 g sodium diet only 3. Make sure they are recording daily weight. Measure weight same time each day 4. Schedule ultrasound-guided paracentesis. Albumin replacement

## 2015-02-28 NOTE — Telephone Encounter (Signed)
Thanks

## 2015-03-01 ENCOUNTER — Ambulatory Visit (HOSPITAL_COMMUNITY)
Admission: RE | Admit: 2015-03-01 | Discharge: 2015-03-01 | Disposition: A | Discharge status: Home / Self Care | Payer: Managed Care, Other (non HMO) | Source: Ambulatory Visit | Attending: Internal Medicine | Admitting: Internal Medicine

## 2015-03-01 DIAGNOSIS — R188 Other ascites: Secondary | ICD-10-CM | POA: Diagnosis not present

## 2015-03-01 DIAGNOSIS — K7460 Unspecified cirrhosis of liver: Secondary | ICD-10-CM | POA: Insufficient documentation

## 2015-03-01 LAB — BODY FLUID CELL COUNT WITH DIFFERENTIAL
EOS FL: 0 %
Lymphs, Fluid: 23 %
MONOCYTE-MACROPHAGE-SEROUS FLUID: 75 % (ref 50–90)
Neutrophil Count, Fluid: 2 % (ref 0–25)
Total Nucleated Cell Count, Fluid: 270 cu mm (ref 0–1000)

## 2015-03-01 MED ORDER — ALBUMIN HUMAN 25 % IV SOLN
40.0000 g | Freq: Once | INTRAVENOUS | Status: AC
  Administered 2015-03-01: 40 g via INTRAVENOUS
  Filled 2015-03-01: qty 200

## 2015-03-01 MED ORDER — LIDOCAINE HCL (PF) 1 % IJ SOLN
INTRAMUSCULAR | Status: AC
  Filled 2015-03-01: qty 10

## 2015-03-01 MED ORDER — ALBUMIN HUMAN 25 % IV SOLN: 40.0000 g | Freq: Once | INTRAVENOUS | Status: DC

## 2015-03-01 NOTE — Procedures (Signed)
US guided diagnostic/therapeutic paracentesis performed yielding 4.8 liters slightly turbid, yellow fluid. A portion of the fluid was sent to the lab for cell count/diff. No immediate complications. The pt will receive IV albumin postprocedure.

## 2015-03-01 NOTE — Progress Notes (Signed)
Verified with Misty in ultrasound that patient was to get 40g albumin x1. Second order was duplicate.

## 2015-03-02 ENCOUNTER — Telehealth: Payer: Self-pay | Admitting: Internal Medicine

## 2015-03-02 LAB — PATHOLOGIST SMEAR REVIEW

## 2015-03-02 NOTE — Telephone Encounter (Signed)
Noted  

## 2015-03-07 ENCOUNTER — Telehealth: Payer: Self-pay | Admitting: *Deleted

## 2015-03-07 NOTE — Telephone Encounter (Signed)
Patient's emergency contact called requesting that the last two office notes be faxed to Dr. Tawni Levy at [facility] prior to his upcoming appt in October. Last two office notes faxed to [phone]. Patient's spouse was frustrated because she stated she has already called and spoke to someone in the front office and also signed a release at [facility] for all records to be faxed to [facility]. Stated, "I guess we'll just show up to the appointment, and hope for the best. Explained our policy that I can not send outside records, but I would be happy to send the last couple of office notes. Advised her to come into [facility] to sign a release if she needs anything further.  Myrtis Hopping

## 2015-03-09 ENCOUNTER — Other Ambulatory Visit: Payer: Self-pay

## 2015-03-09 ENCOUNTER — Telehealth: Payer: Self-pay | Admitting: Internal Medicine

## 2015-03-09 DIAGNOSIS — R188 Other ascites: Secondary | ICD-10-CM

## 2015-03-09 NOTE — Telephone Encounter (Signed)
Left message to call back.  [name] Pt states he has fluid building up in his abdomen again. Pt has history of cirrhosis and has had several paracentesis done recently. Wife states he has only had a 4 pound weight gain but pt states he feels like he needs another para. Please advise as doc of the day.

## 2015-03-09 NOTE — Telephone Encounter (Signed)
Can't get that done today - if feels very bad would need to go to ED O/w can set one up with albumin and cell count up to 8L for Tuesday or later

## 2015-03-09 NOTE — Telephone Encounter (Signed)
Pt scheduled to have [place] para at [facility] 03/14/15@10am , pt to arrive there at 9:45am. Pt to have no more than 8 liters drawn off, pt to receive albumin 8gm IV for each liter removed. Fluid to be sent for cell count and diff. Pt aware of appt.

## 2015-03-14 ENCOUNTER — Encounter (HOSPITAL_COMMUNITY)
Admission: RE | Admit: 2015-03-14 | Discharge: 2015-03-14 | Disposition: A | Discharge status: Home / Self Care | Payer: Managed Care, Other (non HMO) | Source: Ambulatory Visit | Attending: Internal Medicine | Admitting: Internal Medicine

## 2015-03-14 ENCOUNTER — Ambulatory Visit (HOSPITAL_COMMUNITY)
Admission: RE | Admit: 2015-03-14 | Discharge: 2015-03-14 | Disposition: A | Discharge status: Home / Self Care | Payer: Managed Care, Other (non HMO) | Source: Ambulatory Visit | Attending: Internal Medicine | Admitting: Internal Medicine

## 2015-03-14 ENCOUNTER — Encounter (HOSPITAL_COMMUNITY): Payer: Self-pay

## 2015-03-14 DIAGNOSIS — R188 Other ascites: Secondary | ICD-10-CM | POA: Insufficient documentation

## 2015-03-14 LAB — BODY FLUID CELL COUNT WITH DIFFERENTIAL
Lymphs, Fluid: 29 %
Monocyte-Macrophage-Serous Fluid: 68 % (ref 50–90)
Neutrophil Count, Fluid: 3 % (ref 0–25)
Total Nucleated Cell Count, Fluid: 455 cu mm (ref 0–1000)

## 2015-03-14 MED ORDER — ALBUMIN HUMAN 25 % IV SOLN
16.0000 g | Freq: Once | INTRAVENOUS | Status: AC
  Administered 2015-03-14: 16 g via INTRAVENOUS
  Filled 2015-03-14: qty 100

## 2015-03-14 MED ORDER — SODIUM CHLORIDE 0.9 % IV SOLN
INTRAVENOUS | Status: DC
  Administered 2015-03-14: 12:00:00 via INTRAVENOUS

## 2015-03-14 NOTE — Discharge Instructions (Signed)
° ° ° ° ° ° ° ° ° ° ° ° ° ° ° ° ° ° ° ° ° ° ° ° ° ° ° ° ° ° ° ° ° ° ° ° ° ° ° ° ° ° ° ° ° ° ° ° ° ° ° ° ° ° ° ° ° ° ° ° ° ° ° ° ° ° ° ° ° ° ° ° ° ° ° ° ° ° ° ° ° ° ° ° ° ° ° ° ° ° ° ° ° ° ° ° ° ° ° ° ° ° ° ° °  Albumin °Albumin tests are done as a screen for a liver disorder or kidney disease or to check nutritional status, especially in hospitalized patients (prealbumin is sometimes used instead of albumin in this situation). °Albumin is the most plentiful protein in the blood plasma. It keeps fluid from leaking out of blood vessels; nourishes tissues; and transports hormones, vitamins, drugs, and ions like calcium throughout the body. Albumin is made in the liver and is extremely sensitive to liver damage. The concentration of albumin drops when the liver is damaged, with kidney disease (nephrotic syndrome), when a person is malnourished, if a person experiences inflammation in the body, or with shock. Albumin increases when a person is dehydrated. °PREPARATION FOR TEST °No preparation or fasting is necessary. A blood sample is taken by a needle from a vein. Tell the person doing the test if you are pregnant. °NORMAL FINDINGS  °Adult/Elderly °· Total Protein: 6.4-8.3 g/dL or 64-83g/L (SI units) °· Albumin; 3.5-5 g/dL or 35-50 g/L (SI units) °· Globulin 2.3-3.4 g/dL °¨ Alpha1 globulin: 0.1-3 g/dL or 1-3 g/L (SI units) °¨ Alpha2 globulin: 0.6-1 g/dL or6-10 g/L (SI units) °¨ Beta globulin: 0.7-1.1 g/dL or 7-11 g/L (SI units) °Children °· Total protein. °¨ Premature infant: 4.2-7.6 g/L °¨ Newborn: 4.6-7.4 g/dL °¨ Infant: 6-6.7 g/L °· Albumin. °¨ Premature infant: 3-4.2 g/dL °¨ Newborn: 3.5-5.4 g/dL °¨ Infant: 4.4-5.4 g/dL °¨ Child: 4-5.9 g/dL °Ranges for normal findings may vary among different laboratories and hospitals. You should always check with your doctor after having lab work or other tests done to discuss the meaning of your test results and whether your values are considered within normal limits. °MEANING OF  TEST  °Your caregiver will go over the test results with you and discuss the importance and meaning of your results, as well as treatment options and the need for additional tests if necessary. °OBTAINING THE TEST RESULTS °It is your responsibility to obtain your test results. Ask the lab or department performing the test when and how you will get your results. °Document Released: 07/15/2004 Document Revised: 09/15/2011 Document Reviewed: 05/28/2008 °ExitCare® Patient Information ©2015 [employer]. This information is not intended to replace advice given to you by your health care provider. Make sure you discuss any questions you have with your health care provider. ° °

## 2015-03-14 NOTE — Procedures (Signed)
Successful US guided paracentesis from LLQ.  Yielded 2.2 liters of serous fluid.  No immediate complications.  Pt tolerated well.  Post procedure IV Albumin was ordered.  Specimen was sent for labs.  Tsosie Billing D PA-C 03/14/2015 11:39 AM

## 2015-03-15 NOTE — Progress Notes (Signed)
Quick Note:  Forward to [name] for review Cx pending ______

## 2015-03-19 ENCOUNTER — Inpatient Hospital Stay: Payer: Managed Care, Other (non HMO) | Admitting: Physical Medicine & Rehabilitation

## 2015-03-26 ENCOUNTER — Encounter: Payer: Managed Care, Other (non HMO) | Admitting: Physical Medicine & Rehabilitation

## 2015-03-30 ENCOUNTER — Telehealth: Payer: Self-pay | Admitting: Internal Medicine

## 2015-03-30 ENCOUNTER — Other Ambulatory Visit: Payer: Self-pay | Admitting: Internal Medicine

## 2015-03-30 NOTE — Telephone Encounter (Signed)
[name] pt with hepatic cirrhosis. Wife is calling because patient is having "swelling in his stomach again and feels like fire in his belly." thinks he needs to have fluid taken off again. Patient does report some SOB and is coughing. Patient's wife aware he may have to go to ED. DOD- Ardis Hughs please, advise.

## 2015-03-30 NOTE — Telephone Encounter (Signed)
We can work on out patient LVP for early next week, but since it's Friday if he is in trouble (severely swollen, worsening shortness of breath) he needs to go to ER.  If he thinks he can wait, 5 liter max LVP early next week through IR, Korea at the hospital needs to be set up.   Thanks

## 2015-03-30 NOTE — Telephone Encounter (Signed)
Spoke with patient's wife and gave her recommendations. She states patient has OV with [name] at 8:30 AM on Monday. She understands that he should go to ED if worsening SOB or increased swelling.

## 2015-04-02 ENCOUNTER — Other Ambulatory Visit (INDEPENDENT_AMBULATORY_CARE_PROVIDER_SITE_OTHER): Payer: Managed Care, Other (non HMO)

## 2015-04-02 ENCOUNTER — Ambulatory Visit (INDEPENDENT_AMBULATORY_CARE_PROVIDER_SITE_OTHER): Payer: Managed Care, Other (non HMO) | Admitting: Internal Medicine

## 2015-04-02 ENCOUNTER — Ambulatory Visit: Payer: Managed Care, Other (non HMO) | Admitting: Internal Medicine

## 2015-04-02 ENCOUNTER — Encounter: Payer: Self-pay | Admitting: Internal Medicine

## 2015-04-02 VITALS — BP 108/66 | HR 72 | Ht 70.0 in

## 2015-04-02 DIAGNOSIS — K7290 Hepatic failure, unspecified without coma: Secondary | ICD-10-CM

## 2015-04-02 DIAGNOSIS — K7682 Hepatic encephalopathy: Secondary | ICD-10-CM

## 2015-04-02 DIAGNOSIS — K7460 Unspecified cirrhosis of liver: Secondary | ICD-10-CM | POA: Diagnosis not present

## 2015-04-02 DIAGNOSIS — I8500 Esophageal varices without bleeding: Secondary | ICD-10-CM

## 2015-04-02 DIAGNOSIS — R188 Other ascites: Secondary | ICD-10-CM

## 2015-04-02 DIAGNOSIS — Z86010 Personal history of colonic polyps: Secondary | ICD-10-CM

## 2015-04-02 LAB — BASIC METABOLIC PANEL
BUN: 32 mg/dL — ABNORMAL HIGH (ref 6–23)
CALCIUM: 9.4 mg/dL (ref 8.4–10.5)
CO2: 25 meq/L (ref 19–32)
CREATININE: 1.31 mg/dL (ref 0.40–1.50)
Chloride: 98 mEq/L (ref 96–112)
GFR: 58.82 mL/min — AB (ref >60.00)
Glucose, Bld: 145 mg/dL — ABNORMAL HIGH (ref 70–99)
Potassium: 5 mEq/L (ref 3.5–5.1)
SODIUM: 131 meq/L — AB (ref 135–145)

## 2015-04-02 NOTE — Patient Instructions (Addendum)
Your physician has requested that you go to the basement for the following lab work before leaving today: BMP  You have been scheduled for an Paracentesis at [facility] (1st floor of hospital) on 04-04-15  At 10:00am . Please arrive 15 minutes prior to your appointment for registration. Should you need to reschedule your appointment, please contact radiology at [phone].   Please Follow up wit [name] in 3 months

## 2015-04-02 NOTE — Progress Notes (Signed)
HISTORY OF PRESENT ILLNESS:  [name] is a 64 y.o. male with multiple medical problems as listed below. 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 Complications of his liver disease have included variceal bleeding, encephalopathy and ascites. He is also followed at the [facility] liver clinic. When I saw the patient 1 year ago his liver disease was clinically compensated. 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 This is being managed medically. He was hospitalized in May with this problem and decompensated liver disease with ascites. He has been seen for refractory ascites over the summer. He underwent paracentesis in July, twice in August, and 3 weeks ago. Between 2 and 8 L have been removed at different sessions. I last saw the patient in the office in June. He has seen our physician assistant since. He was also seen at [facility] liver clinic last week. 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 He continues on amoxicillin. The chief complaint today is abdominal discomfort associated with recurrent ascites. No fevers. No evidence for SBP on multiple paracenteses. He has not been taking his medications for several days feeling like they upset his stomach. He is accompanied today by his wife. Less issues with encephalopathy by history. Review of outside records from [place]. Total bilirubin 1.7. Albumin 3.1. BUN 36. Creatinine 1.6. Glucose 215. Sodium 130. No active reflux symptoms. Last colonoscopy September 2013 with tubulovillous adenoma. Follow-up in 3 years recommended.  REVIEW OF SYSTEMS:  All non-GI ROS negative except for anxiety, depression, fatigue, muscle cramps, sleeping problems, shortness of breath, somnolent  Past Medical History  Diagnosis Date  . Reflux esophagitis   . Portal hypertension   . Stricture and  stenosis of esophagus   . Diverticulosis of colon (without mention of hemorrhage)   . Internal hemorrhoids without mention of complication   . Benign neoplasm of colon   . Duodenitis without mention of hemorrhage   . 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 Personal history of diseases of blood and blood-forming organs   . 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   Marland Kitchen Unspecified essential hypertension   . Obesity, unspecified   . Type II or unspecified type diabetes mellitus without mention of complication, not stated as uncontrolled   . GERD (gastroesophageal reflux disease)   . Hemorrhoids   . Colon polyps 2004, 2006, 2008, 11/2011    Adenomatous and tubulovillous adenoma.  . Pancytopenia   . Calculus of kidney yrs ago  . Ascites     Past Surgical History  Procedure Laterality Date  . Kidney stone surgery  yrs ago  . Esophagogastroduodenoscopy  08/01/2011    Procedure: ESOPHAGOGASTRODUODENOSCOPY (EGD);  Surgeon: Scarlette Shorts, MD;  Location: [facility];  Service: Endoscopy;  Laterality: N/A;  . Esophagogastroduodenoscopy N/A 10/27/2012    Procedure: ESOPHAGOGASTRODUODENOSCOPY (EGD);  Surgeon: Inda Castle, MD;  Location: [facility];  Service: Endoscopy;  Laterality: N/A;  . Esophagogastroduodenoscopy N/A 11/22/2012    Procedure: ESOPHAGOGASTRODUODENOSCOPY (EGD);  Surgeon: Irene Shipper, MD;  Location: [facility];  Service: Endoscopy;  Laterality: N/A;  . Colonoscopy    . Upper gastrointestinal endoscopy    . Esophagogastroduodenoscopy N/A 11/29/2013    Procedure: ESOPHAGOGASTRODUODENOSCOPY (EGD);  Surgeon: Irene Shipper, MD;  Location: [facility];  Service: Endoscopy;  Laterality: N/A;  .  Esophageal banding Bilateral 11/29/2013    Procedure: ESOPHAGEAL BANDING;  Surgeon: Irene Shipper, MD;  Location: [facility];  Service: Endoscopy;  Laterality: Bilateral;  . Esophagogastroduodenoscopy N/A 06/13/2014    Procedure: ESOPHAGOGASTRODUODENOSCOPY  (EGD);  Surgeon: Irene Shipper, MD;  Location: [facility];  Service: Endoscopy;  Laterality: N/A;  . Esophageal banding N/A 06/13/2014    Procedure: ESOPHAGEAL BANDING;  Surgeon: Irene Shipper, MD;  Location: [facility];  Service: Endoscopy;  Laterality: N/A;    Social History [name]  reports that he has never smoked. He has never used smokeless tobacco. He reports that he does not drink alcohol or use illicit drugs.  family history includes Aneurysm in his mother; Diabetes in his father and another family member; Heart disease in his father and other. There is no history of Colon cancer, Anesthesia problems, Hypotension, Malignant hyperthermia, or Pseudochol deficiency.  No Known Allergies     PHYSICAL EXAMINATION: Vital signs: BP 108/66 mmHg  Pulse 72  Ht 5\' 10"  (1.778 m)  Constitutional: Chronically ill-appearing, lying on examination table, no acute distress Psychiatric: alert and oriented x3, cooperative Eyes: extraocular movements intact, anicteric, conjunctiva pink Mouth: oral pharynx moist, no lesions Neck: supple without thyromegaly Lymph: no lymphadenopathy Cardiovascular: heart regular rate and rhythm, no murmur Lungs: clear to auscultation bilaterally Abdomen: soft, nontender, mildly distended with probable omitted ascites, no peritoneal signs, normal bowel sounds, no organomegaly Rectal: Ommitted Extremities: no clubbing cyanosis or lower extremity edema bilaterally Skin: no lesions on visible extremities. Hypervascularity of the face Neuro: No focal deficits. No asterixis.   ASSESSMENT:  #1. NASH cirrhosis. Decompensated liver disease in the face of acute illness (epidural abscess April 2016) #2. Refractory ascites. Ongoing #3. History of bleeding varices. Last upper endoscopy December 2015 without significant residual varices. Portal gastropathy present #4. History of hepatic encephalopathy. No evidence of such today #5. History of advanced colonic  neoplasia. Last colonoscopy September 2013. Not appropriate for surveillance at this time. Advised #6. GERD. Asymptomatic on PPI #7. Multiple medical problems. Ongoing   PLAN:  #1. Continue Aldactone 200 mg daily and Lasix 60 mg daily #2. Basic metabolic panel today. Monitor electrolytes and renal function #3. Compliance with medication stressed #4. Continue Xifaxan and lactulose #5. Continue PPI #6. Surveillance colonoscopy on hold indefinitely #7. Keep follow-up at [facility] liver clinic next month #8. GI follow-up in this office in 3 months. The patient and his wife requests out of work note for the next 3 months. This is completely appropriate as he is not physically capable of working in the immediate future.  60 minutes spent face-to-face with the patient. Greater than 50% of the time used for counseling regarding his multiple GI and liver issues. As well, to answer multiple questions from the patient and his wife.

## 2015-04-04 ENCOUNTER — Ambulatory Visit (HOSPITAL_COMMUNITY)
Admission: RE | Admit: 2015-04-04 | Discharge: 2015-04-04 | Disposition: A | Discharge status: Home / Self Care | Payer: Managed Care, Other (non HMO) | Source: Ambulatory Visit | Attending: Internal Medicine | Admitting: Internal Medicine

## 2015-04-04 DIAGNOSIS — R188 Other ascites: Secondary | ICD-10-CM

## 2015-04-04 LAB — BODY FLUID CELL COUNT WITH DIFFERENTIAL
Eos, Fluid: 1 %
Lymphs, Fluid: 35 %
MONOCYTE-MACROPHAGE-SEROUS FLUID: 56 % (ref 50–90)
Neutrophil Count, Fluid: 8 % (ref 0–25)
Total Nucleated Cell Count, Fluid: 194 cu mm (ref 0–1000)

## 2015-04-04 MED ORDER — ALBUMIN HUMAN 25 % IV SOLN
62.5000 g | INTRAVENOUS | Status: AC
  Administered 2015-04-04: 62.5 g via INTRAVENOUS
  Filled 2015-04-04: qty 300

## 2015-04-04 MED ORDER — LIDOCAINE HCL (PF) 1 % IJ SOLN
INTRAMUSCULAR | Status: AC
Start: 2015-04-04 — End: 2015-04-04
  Filled 2015-04-04: qty 10

## 2015-04-04 NOTE — Procedures (Signed)
Successful US guided paracentesis from LLQ.  Yielded 8L of clear yellow fluid.  No immediate complications.  Pt tolerated well.   Specimen was sent for labs.  Ascencion Dike PA-C 04/04/2015 11:07 AM

## 2015-04-05 LAB — PATHOLOGIST SMEAR REVIEW

## 2015-04-06 ENCOUNTER — Telehealth: Payer: Self-pay | Admitting: Internal Medicine

## 2015-04-06 ENCOUNTER — Other Ambulatory Visit (INDEPENDENT_AMBULATORY_CARE_PROVIDER_SITE_OTHER): Payer: Managed Care, Other (non HMO)

## 2015-04-06 ENCOUNTER — Other Ambulatory Visit: Payer: Self-pay

## 2015-04-06 DIAGNOSIS — R188 Other ascites: Principal | ICD-10-CM

## 2015-04-06 DIAGNOSIS — K7460 Unspecified cirrhosis of liver: Secondary | ICD-10-CM

## 2015-04-06 LAB — BASIC METABOLIC PANEL
BUN: 25 mg/dL — AB (ref 6–23)
CO2: 27 meq/L (ref 19–32)
Calcium: 9.1 mg/dL (ref 8.4–10.5)
Chloride: 96 mEq/L (ref 96–112)
Creatinine, Ser: 1.31 mg/dL (ref 0.40–1.50)
GFR: 58.82 mL/min — AB (ref >60.00)
GLUCOSE: 349 mg/dL — AB (ref 70–99)
POTASSIUM: 5 meq/L (ref 3.5–5.1)
Sodium: 130 mEq/L — ABNORMAL LOW (ref 135–145)

## 2015-04-06 NOTE — Telephone Encounter (Signed)
He just had 8 liters removed 2 days ago.  Has he restarted his diuretics as [name] advised on Monday?  If not, he needs to.   I don't think repeat LVP now is safe, could be high kidney risk since 8 liters just removed 2 days ago.  He should have BMET checked today.  Pending Cr level, it may be safe for repeat LVP next week.  Also advise him on strict low salt diet.

## 2015-04-06 NOTE — Telephone Encounter (Signed)
Pt calling requesting paracentesis. [name] pt that has been receiving LVP drawing off no more than 8 liters of fluid, receive albumin 8gm IV for each liter off and fluid sent for cell count and diff. Dr. Ardis Hughs as doc of the day please advise if ok to schedule?

## 2015-04-06 NOTE — Telephone Encounter (Signed)
Spoke with pts wife, she states he is taking the diuretics. Pt will come this afternoon for labs. Pt knows paracentesis would be scheduled next week if kidney function is ok.

## 2015-04-09 NOTE — Telephone Encounter (Signed)
Left message for pt to call back  °

## 2015-04-09 NOTE — Telephone Encounter (Signed)
Pt called Friday requesting another paracentesis. Please see note below from Dr. Ardis Hughs. Pt did have bmet Friday. Please advise if ok for pt to have paracentesis this week.

## 2015-04-09 NOTE — Telephone Encounter (Signed)
Continue diuretic's, 2 g sodium diet, measure daily weight at home and record, repeat basic metabolic panel in 1 week. He should contact us in one week with his weight measurements. Too soon for paracentesis this week. Need to watch kidney function. Might increase diuretic's, but not right now.,

## 2015-04-10 NOTE — Telephone Encounter (Signed)
Spoke with pt and his wife and they are aware. Order in epic for labs.

## 2015-04-11 ENCOUNTER — Encounter
Payer: Managed Care, Other (non HMO) | Attending: Physical Medicine & Rehabilitation | Admitting: Physical Medicine & Rehabilitation

## 2015-04-11 ENCOUNTER — Encounter: Payer: Self-pay | Admitting: Physical Medicine & Rehabilitation

## 2015-04-11 VITALS — BP 117/72 | HR 74

## 2015-04-11 DIAGNOSIS — D696 Thrombocytopenia, unspecified: Secondary | ICD-10-CM | POA: Insufficient documentation

## 2015-04-11 DIAGNOSIS — D126 Benign neoplasm of colon, unspecified: Secondary | ICD-10-CM | POA: Diagnosis not present

## 2015-04-11 DIAGNOSIS — R188 Other ascites: Secondary | ICD-10-CM | POA: Diagnosis not present

## 2015-04-11 DIAGNOSIS — E669 Obesity, unspecified: Secondary | ICD-10-CM | POA: Diagnosis not present

## 2015-04-11 DIAGNOSIS — K5730 Diverticulosis of large intestine without perforation or abscess without bleeding: Secondary | ICD-10-CM | POA: Diagnosis not present

## 2015-04-11 DIAGNOSIS — G959 Disease of spinal cord, unspecified: Secondary | ICD-10-CM | POA: Diagnosis not present

## 2015-04-11 DIAGNOSIS — K7581 Nonalcoholic steatohepatitis (NASH): Secondary | ICD-10-CM | POA: Diagnosis not present

## 2015-04-11 DIAGNOSIS — I8500 Esophageal varices without bleeding: Secondary | ICD-10-CM | POA: Diagnosis not present

## 2015-04-11 DIAGNOSIS — Z87442 Personal history of urinary calculi: Secondary | ICD-10-CM | POA: Insufficient documentation

## 2015-04-11 DIAGNOSIS — K210 Gastro-esophageal reflux disease with esophagitis: Secondary | ICD-10-CM | POA: Diagnosis not present

## 2015-04-11 DIAGNOSIS — E119 Type 2 diabetes mellitus without complications: Secondary | ICD-10-CM | POA: Diagnosis not present

## 2015-04-11 DIAGNOSIS — K7290 Hepatic failure, unspecified without coma: Secondary | ICD-10-CM | POA: Insufficient documentation

## 2015-04-11 DIAGNOSIS — I10 Essential (primary) hypertension: Secondary | ICD-10-CM | POA: Insufficient documentation

## 2015-04-11 DIAGNOSIS — K7682 Hepatic encephalopathy: Secondary | ICD-10-CM

## 2015-04-11 DIAGNOSIS — G061 Intraspinal abscess and granuloma: Secondary | ICD-10-CM | POA: Insufficient documentation

## 2015-04-11 MED ORDER — BACLOFEN 10 MG PO TABS: 5.0000 mg | ORAL_TABLET | Freq: Every day | ORAL | Status: DC

## 2015-04-11 NOTE — Progress Notes (Signed)
Subjective:    Patient ID: [name], male    DOB: 08-Apr-1951, 64 y.o.   MRN: [id number]  HPI   Mr. 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  He had a fall a couple months ago when he missed a step. He's been stumbling a fair amount because of his ongoing left leg weakness and impaired coordination. He gets cramping in his legs which really bother him at night. He has persistent numbness in the left hand which is a nuissance also.  He's had ongoing ascites which has required continued paracentesis. He is followed by [name]. He is on chronic xifaxan, lactulose for ammonia control.  He just started PT in [place] to work on strength and balance.       Pain Inventory Average Pain 4 Pain Right Now 4 My pain is tingling  In the last 24 hours, has pain interfered with the following? General activity 10 Relation with others 2 Enjoyment of life 10 What TIME of day is your pain at its worst? night Sleep (in general) Poor  Pain is worse with: all Pain improves with: heat/ice Relief from Meds: 4  Mobility walk without assistance how many minutes can you walk? 5 Do you have any goals in this area?  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  no  Physicians involved in your care Any changes since last visit?  no   Family History  Problem Relation Age of Onset  . Colon cancer Neg Hx   . Anesthesia problems Neg Hx   . Hypotension Neg Hx   . Malignant hyperthermia Neg Hx   . Pseudochol deficiency Neg Hx   . Diabetes Father   . Heart disease Father   . Aneurysm Mother   . Heart disease Other     neice  . Diabetes      niece   Social History   Social History  . Marital Status: Married    Spouse Name: N/A  . Number of Children: N/A  . Years of Education:  N/A   Occupational History  . retired    Social History Main Topics  . Smoking status: Never Smoker   . Smokeless tobacco: Never Used  . Alcohol Use: No     Comment: very rare  . Drug Use: No  . Sexual Activity: No   Other Topics Concern  . None   Social History Narrative   Past Surgical History  Procedure Laterality Date  . Kidney stone surgery  yrs ago  . Esophagogastroduodenoscopy  08/01/2011    Procedure: ESOPHAGOGASTRODUODENOSCOPY (EGD);  Surgeon: Scarlette Shorts, MD;  Location: [facility];  Service: Endoscopy;  Laterality: N/A;  . Esophagogastroduodenoscopy N/A 10/27/2012    Procedure: ESOPHAGOGASTRODUODENOSCOPY (EGD);  Surgeon: Inda Castle, MD;  Location: [facility];  Service: Endoscopy;  Laterality: N/A;  . Esophagogastroduodenoscopy N/A 11/22/2012    Procedure: ESOPHAGOGASTRODUODENOSCOPY (EGD);  Surgeon: Irene Shipper, MD;  Location: [facility];  Service: Endoscopy;  Laterality: N/A;  . Colonoscopy    . Upper gastrointestinal endoscopy    . Esophagogastroduodenoscopy N/A 11/29/2013    Procedure: ESOPHAGOGASTRODUODENOSCOPY (EGD);  Surgeon: Irene Shipper, MD;  Location: [facility];  Service: Endoscopy;  Laterality: N/A;  . Esophageal banding Bilateral 11/29/2013    Procedure: ESOPHAGEAL BANDING;  Surgeon: Irene Shipper, MD;  Location:  [facility];  Service: Endoscopy;  Laterality: Bilateral;  . Esophagogastroduodenoscopy N/A 06/13/2014    Procedure: ESOPHAGOGASTRODUODENOSCOPY (EGD);  Surgeon: Irene Shipper, MD;  Location: [facility];  Service: Endoscopy;  Laterality: N/A;  . Esophageal banding N/A 06/13/2014    Procedure: ESOPHAGEAL BANDING;  Surgeon: Irene Shipper, MD;  Location: [facility];  Service: Endoscopy;  Laterality: N/A;   Past Medical History  Diagnosis Date  . Reflux esophagitis   . Portal hypertension ([place])   . Stricture and stenosis of esophagus   . Diverticulosis of colon (without mention of hemorrhage)   . Internal hemorrhoids without mention of  complication   . Benign neoplasm of colon   . Duodenitis without mention of hemorrhage   . 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 Personal history of diseases of blood and blood-forming organs   . 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   Marland Kitchen Unspecified essential hypertension   . Obesity, unspecified   . Type II or unspecified type diabetes mellitus without mention of complication, not stated as uncontrolled   . GERD (gastroesophageal reflux disease)   . Hemorrhoids   . Colon polyps 2004, 2006, 2008, 11/2011    Adenomatous and tubulovillous adenoma.  . Pancytopenia   . Calculus of kidney yrs ago  . Ascites    BP 117/72 mmHg  Pulse 74  SpO2 100%  Opioid Risk Score:   Fall Risk Score:  `1  Depression screen PHQ 2/9  Depression screen PHQ 2/9 12/05/2014  Decreased Interest 0  Down, Depressed, Hopeless 0  PHQ - 2 Score 0      Review of Systems  Constitutional: Positive for chills.  Neurological: Positive for numbness.       Spasms   All other systems reviewed and are negative.      Objective:   Physical Exam  Constitutional: He is oriented to person, place, and time. He appears well-developed and well-nourished.  HENT:  Head: Normocephalic and atraumatic.  Eyes: Conjunctivae are normal. Pupils are equal, round, and reactive to light.  Neck: Muscular tenderness present. Decreased range of motion present.    Cardiovascular: Normal rate and regular rhythm.  Respiratory: Effort normal and diminished basilar breath sounds . No respiratory distress. bilateral wheezes.  GI: Abdomen softer but sill with ongoing ascites/ distention. Bowel sounds are improved. There is no tenderness.  Musculoskeletal: He exhibits edema (1+ edema pedally). Minimal scrotal edema.  Neck pain with ROM bilateral hips and LUE.  Neurological: He is alert and oriented to person, place, and time. Coordination abnormal.   . RUE 4+/5. L- deltoid 4/5, 4  biceps, 4 triceps, intrinsics 4+/5. BLE 4+ RLE, 4/5 LLE. Favors left leg during gait. Did not lose balance. DTR'sare 3+. MILD LT and pain loss in left arm and leg. DTR's 1+  Skin: Skin is warm and dry. Healing scar on left patella---appears to have blood undewrneath   Psychiatric: His speech is normal. He's alert   Assessment/Plan:  1. 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 Remove area rugs 2. Lower extremity cramps:  -low dose baclofen trial, 5-10mg  qhs  -stretching  -continue strengthening exercises  -adequate fluid intake 4. Mood: provide ego support.   5. Hepatic Cirrhosis due to NASH: per GI 6. Thrombocytopenia: chronic 7. DM type 2: per primary  -gabapentin for ?neruopathic pain.   Follow  up with me in two months. Extensive paperwork was completed. Thirty minutes of face to face patient care time were spent during this visit. All questions were encouraged and answered.

## 2015-04-11 NOTE — Patient Instructions (Signed)
PLEASE CALL ME WITH ANY PROBLEMS OR QUESTIONS (#[phone]).  HAVE A GOOD DAY!   WORK ON REGULAR STRETCHING BEFORE YOU GO TO BED. DRINK ADEQUATE FLUIDS

## 2015-04-16 ENCOUNTER — Telehealth: Payer: Self-pay

## 2015-04-16 NOTE — Telephone Encounter (Signed)
-----   Message from Algernon Huxley, RN sent at 04/09/2015  4:47 PM EDT ----- Regarding: bmet Pt needs bmet Monday, order in epic.

## 2015-04-16 NOTE — Telephone Encounter (Signed)
Spoke with pt and he states he will come on Wednesday for labs. Pts mother passed away this weekend. Orders in epic.

## 2015-04-18 ENCOUNTER — Encounter: Payer: Self-pay | Admitting: Infectious Disease

## 2015-04-18 ENCOUNTER — Ambulatory Visit (INDEPENDENT_AMBULATORY_CARE_PROVIDER_SITE_OTHER): Payer: Managed Care, Other (non HMO) | Admitting: Infectious Disease

## 2015-04-18 VITALS — BP 119/77 | HR 76 | Temp 97.7°F | Wt 175.0 lb

## 2015-04-18 DIAGNOSIS — B955 Unspecified streptococcus as the cause of diseases classified elsewhere: Secondary | ICD-10-CM

## 2015-04-18 DIAGNOSIS — G061 Intraspinal abscess and granuloma: Secondary | ICD-10-CM | POA: Diagnosis not present

## 2015-04-18 DIAGNOSIS — B954 Other streptococcus as the cause of diseases classified elsewhere: Secondary | ICD-10-CM

## 2015-04-18 DIAGNOSIS — K7581 Nonalcoholic steatohepatitis (NASH): Secondary | ICD-10-CM

## 2015-04-18 DIAGNOSIS — R7881 Bacteremia: Secondary | ICD-10-CM

## 2015-04-18 DIAGNOSIS — B951 Streptococcus, group B, as the cause of diseases classified elsewhere: Secondary | ICD-10-CM | POA: Diagnosis not present

## 2015-04-18 DIAGNOSIS — A491 Streptococcal infection, unspecified site: Secondary | ICD-10-CM

## 2015-04-18 DIAGNOSIS — M4642 Discitis, unspecified, cervical region: Secondary | ICD-10-CM

## 2015-04-18 LAB — BASIC METABOLIC PANEL WITH GFR
BUN: 20 mg/dL (ref 7–25)
CALCIUM: 9.3 mg/dL (ref 8.6–10.3)
CHLORIDE: 99 mmol/L (ref 98–110)
CO2: 26 mmol/L (ref 20–31)
CREATININE: 1.27 mg/dL — AB (ref 0.70–1.25)
GFR, Est African American: 70 mL/min (ref >=60)
GFR, Est Non African American: 60 mL/min (ref >=60)
Glucose, Bld: 176 mg/dL — ABNORMAL HIGH (ref 65–99)
Potassium: 4.6 mmol/L (ref 3.5–5.3)
SODIUM: 133 mmol/L — AB (ref 135–146)

## 2015-04-18 LAB — C-REACTIVE PROTEIN: CRP: 0.7 mg/dL — AB (ref <0.60)

## 2015-04-18 NOTE — Progress Notes (Signed)
Chief complaint: having less bowel movements  Subjective:    Patient ID: [name], male    DOB: [birth year], 64 y.o.   MRN: [id number]  HPI   64 y.o. male with with GROUP B STREPTOCCAL bacteremia and remarkable epidural abscess that extends in the foreman magnum down through C6 . He has comorbid NASH, thrombocytopenia and was not felt to be a good operative candidtate. Since being on ROcephin  he has had improvement in UE strength but his left leg has felt like a "board" and is painful and week intermittently.   We changed him to high dose amoxicillin and he has had MRI of C and L spine. 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 Saintclair Halsted wishes to continue with nonoperative management and protracted abx.  He also has had complicated course with him having been admitted to ED in [place] for hepatic encephalopathy due to his having stopped his lactulose.   He returns to clinic and seems to be redoing relatively well he still has residual persistent weakness more on the left side of his body but this is not worsened. I instructed him to please get in touch with Dr. Saintclair Halsted immediately should he have any worsening of his neurological symptoms.  Surprisingly still is not fairly adherent with his lactulose only taking it sporadically and not having even 3 bowel movements per day even having no bowel movements for several days. I warned him again this is a critically dangerous given his cirrhosis and prior episodes of hepatic encephalopathy. He is scheduled see [name] shortly has been having serial paracenteses    Review of Systems  Constitutional: Negative for fever, chills, diaphoresis, activity change, appetite change, fatigue and unexpected weight change.  HENT: Negative for congestion, rhinorrhea, sinus pressure, sneezing, sore throat and trouble swallowing.   Eyes: Negative for photophobia and visual disturbance.  Respiratory: Negative for cough, chest  tightness, shortness of breath, wheezing and stridor.   Cardiovascular: Negative for chest pain, palpitations and leg swelling.  Gastrointestinal: Negative for nausea, vomiting, abdominal pain, diarrhea, constipation, blood in stool, abdominal distention and anal bleeding.  Genitourinary: Negative for dysuria, hematuria, flank pain and difficulty urinating.  Musculoskeletal: Negative for myalgias, back pain, joint swelling, arthralgias and gait problem.  Skin: Negative for color change, pallor, rash and wound.  Neurological: Positive for weakness and numbness. Negative for dizziness, tremors and light-headedness.  Hematological: Negative for adenopathy. Does not bruise/bleed easily.  Psychiatric/Behavioral: Positive for confusion. Negative for behavioral problems, sleep disturbance, dysphoric mood, decreased concentration and agitation.       Objective:   Physical Exam  Constitutional: He is oriented to person, place, and time. He appears well-developed and well-nourished.  HENT:  Head: Normocephalic and atraumatic.  Eyes: Conjunctivae and EOM are normal.  Neck: Normal range of motion. Neck supple.  Cardiovascular: Normal rate and regular rhythm.   Pulmonary/Chest: Effort normal. No respiratory distress. He has no wheezes.  Abdominal: Soft. He exhibits no distension.  Musculoskeletal: Normal range of motion. He exhibits no edema or tenderness.  Neurological: He is alert and oriented to person, place, and time. No cranial nerve deficit. GCS eye subscore is 4. GCS verbal subscore is 5. GCS motor subscore is 6.  Reflex Scores:      Bicep reflexes are 4+ on the right side and 4+ on the left side.      Patellar reflexes are 4+ on the right side and 4+ on the left side.  Achilles reflexes are 4+ on the right side and 4+ on the left side. Skin: Skin is warm and dry. No rash noted. No erythema.  Psychiatric: He has a normal mood and affect. His behavior is normal.          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 Saintclair Halsted IMMEDIATELY should he have worsening neck pain, or new or worsening neurological ssx that would portend need for urgent Neurosurgical intervention  We are rechecking labs today   NASH: followed by Dr [name] with recent bout of encephalopathy. Encouraged him to ADHERE to his lactulose and care for his liver strictly, will let [name] know about his poor adherence. 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

## 2015-04-19 LAB — SEDIMENTATION RATE: SED RATE: 5 mm/h (ref 0–20)

## 2015-04-23 ENCOUNTER — Telehealth: Payer: Self-pay

## 2015-04-23 ENCOUNTER — Other Ambulatory Visit: Payer: Self-pay

## 2015-04-23 DIAGNOSIS — R188 Other ascites: Principal | ICD-10-CM

## 2015-04-23 DIAGNOSIS — K7460 Unspecified cirrhosis of liver: Secondary | ICD-10-CM

## 2015-04-23 NOTE — Telephone Encounter (Signed)
Pt had labs last week with ID. Pts wife called and wanted to make sure [name] saw those labs. BMET was drawn 04/18/15. [name] notified.

## 2015-04-23 NOTE — Telephone Encounter (Signed)
Looks fine. Repeat in 4 weeks

## 2015-04-23 NOTE — Telephone Encounter (Signed)
Pt aware, order in epic. 

## 2015-04-25 DIAGNOSIS — R188 Other ascites: Secondary | ICD-10-CM | POA: Insufficient documentation

## 2015-04-26 ENCOUNTER — Emergency Department (HOSPITAL_COMMUNITY): Payer: Managed Care, Other (non HMO)

## 2015-04-26 ENCOUNTER — Ambulatory Visit: Payer: Managed Care, Other (non HMO) | Admitting: Internal Medicine

## 2015-04-26 ENCOUNTER — Inpatient Hospital Stay (HOSPITAL_COMMUNITY)
Admission: EM | Admit: 2015-04-26 | Discharge: 2015-04-28 | DRG: 441 | Disposition: A | Discharge status: Home / Self Care | Payer: Managed Care, Other (non HMO) | Attending: Internal Medicine | Admitting: Internal Medicine

## 2015-04-26 ENCOUNTER — Telehealth: Payer: Self-pay

## 2015-04-26 ENCOUNTER — Encounter (HOSPITAL_COMMUNITY): Payer: Self-pay | Admitting: Emergency Medicine

## 2015-04-26 DIAGNOSIS — Z8249 Family history of ischemic heart disease and other diseases of the circulatory system: Secondary | ICD-10-CM | POA: Diagnosis not present

## 2015-04-26 DIAGNOSIS — I10 Essential (primary) hypertension: Secondary | ICD-10-CM | POA: Diagnosis present

## 2015-04-26 DIAGNOSIS — R140 Abdominal distension (gaseous): Secondary | ICD-10-CM | POA: Diagnosis present

## 2015-04-26 DIAGNOSIS — K766 Portal hypertension: Secondary | ICD-10-CM | POA: Diagnosis present

## 2015-04-26 DIAGNOSIS — B951 Streptococcus, group B, as the cause of diseases classified elsewhere: Secondary | ICD-10-CM | POA: Diagnosis present

## 2015-04-26 DIAGNOSIS — G061 Intraspinal abscess and granuloma: Secondary | ICD-10-CM | POA: Diagnosis present

## 2015-04-26 DIAGNOSIS — Z9119 Patient's noncompliance with other medical treatment and regimen: Secondary | ICD-10-CM | POA: Diagnosis not present

## 2015-04-26 DIAGNOSIS — I440 Atrioventricular block, first degree: Secondary | ICD-10-CM | POA: Diagnosis present

## 2015-04-26 DIAGNOSIS — D61818 Other pancytopenia: Secondary | ICD-10-CM | POA: Diagnosis present

## 2015-04-26 DIAGNOSIS — I8500 Esophageal varices without bleeding: Secondary | ICD-10-CM | POA: Diagnosis not present

## 2015-04-26 DIAGNOSIS — K210 Gastro-esophageal reflux disease with esophagitis: Secondary | ICD-10-CM | POA: Diagnosis present

## 2015-04-26 DIAGNOSIS — E1165 Type 2 diabetes mellitus with hyperglycemia: Secondary | ICD-10-CM | POA: Diagnosis present

## 2015-04-26 DIAGNOSIS — Z66 Do not resuscitate: Secondary | ICD-10-CM | POA: Diagnosis present

## 2015-04-26 DIAGNOSIS — N400 Enlarged prostate without lower urinary tract symptoms: Secondary | ICD-10-CM | POA: Diagnosis present

## 2015-04-26 DIAGNOSIS — R7881 Bacteremia: Secondary | ICD-10-CM | POA: Diagnosis present

## 2015-04-26 DIAGNOSIS — K7200 Acute and subacute hepatic failure without coma: Principal | ICD-10-CM | POA: Diagnosis present

## 2015-04-26 DIAGNOSIS — G062 Extradural and subdural abscess, unspecified: Secondary | ICD-10-CM | POA: Diagnosis not present

## 2015-04-26 DIAGNOSIS — K7581 Nonalcoholic steatohepatitis (NASH): Secondary | ICD-10-CM | POA: Diagnosis present

## 2015-04-26 DIAGNOSIS — Z9114 Patient's other noncompliance with medication regimen: Secondary | ICD-10-CM

## 2015-04-26 DIAGNOSIS — K7290 Hepatic failure, unspecified without coma: Secondary | ICD-10-CM | POA: Diagnosis not present

## 2015-04-26 DIAGNOSIS — B954 Other streptococcus as the cause of diseases classified elsewhere: Secondary | ICD-10-CM

## 2015-04-26 DIAGNOSIS — Z87442 Personal history of urinary calculi: Secondary | ICD-10-CM

## 2015-04-26 DIAGNOSIS — G8250 Quadriplegia, unspecified: Secondary | ICD-10-CM | POA: Diagnosis present

## 2015-04-26 DIAGNOSIS — Z833 Family history of diabetes mellitus: Secondary | ICD-10-CM | POA: Diagnosis not present

## 2015-04-26 DIAGNOSIS — Z86010 Personal history of colonic polyps: Secondary | ICD-10-CM | POA: Diagnosis not present

## 2015-04-26 DIAGNOSIS — D509 Iron deficiency anemia, unspecified: Secondary | ICD-10-CM | POA: Diagnosis present

## 2015-04-26 DIAGNOSIS — G8929 Other chronic pain: Secondary | ICD-10-CM | POA: Diagnosis present

## 2015-04-26 DIAGNOSIS — M4640 Discitis, unspecified, site unspecified: Secondary | ICD-10-CM | POA: Diagnosis present

## 2015-04-26 DIAGNOSIS — D6959 Other secondary thrombocytopenia: Secondary | ICD-10-CM | POA: Diagnosis present

## 2015-04-26 DIAGNOSIS — K3189 Other diseases of stomach and duodenum: Secondary | ICD-10-CM | POA: Diagnosis present

## 2015-04-26 DIAGNOSIS — Z792 Long term (current) use of antibiotics: Secondary | ICD-10-CM | POA: Diagnosis not present

## 2015-04-26 DIAGNOSIS — K5900 Constipation, unspecified: Secondary | ICD-10-CM | POA: Diagnosis present

## 2015-04-26 DIAGNOSIS — Z79899 Other long term (current) drug therapy: Secondary | ICD-10-CM | POA: Diagnosis not present

## 2015-04-26 DIAGNOSIS — K7460 Unspecified cirrhosis of liver: Secondary | ICD-10-CM

## 2015-04-26 DIAGNOSIS — R278 Other lack of coordination: Secondary | ICD-10-CM | POA: Diagnosis present

## 2015-04-26 DIAGNOSIS — K7031 Alcoholic cirrhosis of liver with ascites: Secondary | ICD-10-CM | POA: Diagnosis present

## 2015-04-26 DIAGNOSIS — G9340 Encephalopathy, unspecified: Secondary | ICD-10-CM | POA: Diagnosis present

## 2015-04-26 DIAGNOSIS — R4182 Altered mental status, unspecified: Secondary | ICD-10-CM

## 2015-04-26 DIAGNOSIS — D696 Thrombocytopenia, unspecified: Secondary | ICD-10-CM | POA: Diagnosis present

## 2015-04-26 DIAGNOSIS — K7682 Hepatic encephalopathy: Secondary | ICD-10-CM | POA: Diagnosis present

## 2015-04-26 DIAGNOSIS — Z8661 Personal history of infections of the central nervous system: Secondary | ICD-10-CM

## 2015-04-26 DIAGNOSIS — Z794 Long term (current) use of insulin: Secondary | ICD-10-CM

## 2015-04-26 DIAGNOSIS — K7469 Other cirrhosis of liver: Secondary | ICD-10-CM

## 2015-04-26 DIAGNOSIS — R1084 Generalized abdominal pain: Secondary | ICD-10-CM | POA: Diagnosis not present

## 2015-04-26 DIAGNOSIS — E7220 Disorder of urea cycle metabolism, unspecified: Secondary | ICD-10-CM

## 2015-04-26 DIAGNOSIS — R188 Other ascites: Secondary | ICD-10-CM | POA: Diagnosis not present

## 2015-04-26 LAB — CBC
HCT: 32.8 % — ABNORMAL LOW (ref 39.0–52.0)
Hemoglobin: 11.4 g/dL — ABNORMAL LOW (ref 13.0–17.0)
MCH: 30.8 pg (ref 26.0–34.0)
MCHC: 34.8 g/dL (ref 30.0–36.0)
MCV: 88.6 fL (ref 78.0–100.0)
PLATELETS: 34 10*3/uL — AB (ref 150–400)
RBC: 3.7 MIL/uL — ABNORMAL LOW (ref 4.22–5.81)
RDW: 15.6 % — AB (ref 11.5–15.5)
WBC: 3.5 10*3/uL — ABNORMAL LOW (ref 4.0–10.5)

## 2015-04-26 LAB — COMPREHENSIVE METABOLIC PANEL
ALK PHOS: 73 U/L (ref 38–126)
ALT: 26 U/L (ref 17–63)
ANION GAP: 6 (ref 5–15)
AST: 34 U/L (ref 15–41)
Albumin: 3.4 g/dL — ABNORMAL LOW (ref 3.5–5.0)
BUN: 21 mg/dL — ABNORMAL HIGH (ref 6–20)
CALCIUM: 9.2 mg/dL (ref 8.9–10.3)
CO2: 26 mmol/L (ref 22–32)
CREATININE: 1.28 mg/dL — AB (ref 0.61–1.24)
Chloride: 100 mmol/L — ABNORMAL LOW (ref 101–111)
GFR, EST NON AFRICAN AMERICAN: 58 mL/min — AB (ref >60)
Glucose, Bld: 237 mg/dL — ABNORMAL HIGH (ref 65–99)
Potassium: 4.4 mmol/L (ref 3.5–5.1)
SODIUM: 132 mmol/L — AB (ref 135–145)
TOTAL PROTEIN: 7.1 g/dL (ref 6.5–8.1)
Total Bilirubin: 1.4 mg/dL — ABNORMAL HIGH (ref 0.3–1.2)

## 2015-04-26 LAB — ALBUMIN, FLUID (OTHER): Albumin, Fluid: <1 g/dL

## 2015-04-26 LAB — AMMONIA: Ammonia: 191 umol/L — ABNORMAL HIGH (ref 9–35)

## 2015-04-26 LAB — URINALYSIS, ROUTINE W REFLEX MICROSCOPIC
BILIRUBIN URINE: NEGATIVE
GLUCOSE, UA: NEGATIVE mg/dL
HGB URINE DIPSTICK: NEGATIVE
KETONES UR: NEGATIVE mg/dL
Leukocytes, UA: NEGATIVE
Nitrite: NEGATIVE
PROTEIN: NEGATIVE mg/dL
Specific Gravity, Urine: >1.046 — ABNORMAL HIGH (ref 1.005–1.030)
UROBILINOGEN UA: 1 mg/dL (ref 0.0–1.0)
pH: 5.5 (ref 5.0–8.0)

## 2015-04-26 LAB — GLUCOSE, CAPILLARY: GLUCOSE-CAPILLARY: 164 mg/dL — AB (ref 65–99)

## 2015-04-26 LAB — BASIC METABOLIC PANEL
ANION GAP: 6 (ref 5–15)
BUN: 22 mg/dL — AB (ref 6–20)
CHLORIDE: 101 mmol/L (ref 101–111)
CO2: 26 mmol/L (ref 22–32)
Calcium: 9.3 mg/dL (ref 8.9–10.3)
Creatinine, Ser: 1.25 mg/dL — ABNORMAL HIGH (ref 0.61–1.24)
GFR calc Af Amer: >60 mL/min (ref >60)
GLUCOSE: 236 mg/dL — AB (ref 65–99)
POTASSIUM: 4.4 mmol/L (ref 3.5–5.1)
Sodium: 133 mmol/L — ABNORMAL LOW (ref 135–145)

## 2015-04-26 LAB — PROTEIN, BODY FLUID: Total protein, fluid: <3 g/dL

## 2015-04-26 LAB — I-STAT CG4 LACTIC ACID, ED
LACTIC ACID, VENOUS: 1.99 mmol/L (ref 0.5–2.0)
Lactic Acid, Venous: 2.04 mmol/L (ref 0.5–2.0)

## 2015-04-26 LAB — PROTIME-INR
INR: 1.38 (ref 0.00–1.49)
Prothrombin Time: 17.1 seconds — ABNORMAL HIGH (ref 11.6–15.2)

## 2015-04-26 LAB — GLUCOSE, PERITONEAL FLUID: Glucose, Peritoneal Fluid: 238 mg/dL

## 2015-04-26 LAB — LACTATE DEHYDROGENASE, PLEURAL OR PERITONEAL FLUID: LD FL: 46 U/L — AB (ref 3–23)

## 2015-04-26 LAB — LIPASE, BLOOD: Lipase: 52 U/L — ABNORMAL HIGH (ref 11–51)

## 2015-04-26 LAB — LACTATE DEHYDROGENASE

## 2015-04-26 MED ORDER — LACTULOSE 10 GM/15ML PO SOLN
20.0000 g | Freq: Once | ORAL | Status: DC
  Filled 2015-04-26: qty 30

## 2015-04-26 MED ORDER — ONDANSETRON HCL 4 MG PO TABS: 4.0000 mg | ORAL_TABLET | Freq: Four times a day (QID) | ORAL | Status: DC | PRN

## 2015-04-26 MED ORDER — FUROSEMIDE 40 MG PO TABS
40.0000 mg | ORAL_TABLET | Freq: Every day | ORAL | Status: DC
  Administered 2015-04-26: 40 mg via ORAL
  Filled 2015-04-26 (×2): qty 1

## 2015-04-26 MED ORDER — LABETALOL HCL 5 MG/ML IV SOLN: 10.0000 mg | Freq: Once | INTRAVENOUS | Status: DC

## 2015-04-26 MED ORDER — PANTOPRAZOLE SODIUM 40 MG PO TBEC
40.0000 mg | DELAYED_RELEASE_TABLET | Freq: Every day | ORAL | Status: DC
  Administered 2015-04-26 – 2015-04-28 (×3): 40 mg via ORAL
  Filled 2015-04-26 (×3): qty 1

## 2015-04-26 MED ORDER — FLUOXETINE HCL 10 MG PO CAPS
10.0000 mg | ORAL_CAPSULE | Freq: Every day | ORAL | Status: DC
  Administered 2015-04-26 – 2015-04-28 (×3): 10 mg via ORAL
  Filled 2015-04-26 (×3): qty 1

## 2015-04-26 MED ORDER — RIFAXIMIN 550 MG PO TABS
550.0000 mg | ORAL_TABLET | Freq: Two times a day (BID) | ORAL | Status: DC
  Administered 2015-04-26 – 2015-04-28 (×4): 550 mg via ORAL
  Filled 2015-04-26 (×5): qty 1

## 2015-04-26 MED ORDER — SORBITOL 70 % SOLN: 30.0000 mL | Freq: Every day | Status: DC | PRN

## 2015-04-26 MED ORDER — BETHANECHOL CHLORIDE 25 MG PO TABS
50.0000 mg | ORAL_TABLET | Freq: Two times a day (BID) | ORAL | Status: DC
  Administered 2015-04-26 – 2015-04-28 (×4): 50 mg via ORAL
  Filled 2015-04-26 (×5): qty 2

## 2015-04-26 MED ORDER — INSULIN DEGLUDEC 200 UNIT/ML ~~LOC~~ SOPN: 200.0000 [IU] | PEN_INJECTOR | Freq: Every day | SUBCUTANEOUS | Status: DC

## 2015-04-26 MED ORDER — SPIRONOLACTONE 100 MG PO TABS
100.0000 mg | ORAL_TABLET | Freq: Every day | ORAL | Status: DC
  Administered 2015-04-26: 100 mg via ORAL
  Filled 2015-04-26: qty 1
  Filled 2015-04-26: qty 4
  Filled 2015-04-26: qty 1
  Filled 2015-04-26: qty 4

## 2015-04-26 MED ORDER — ONDANSETRON HCL 4 MG/2ML IJ SOLN: 4.0000 mg | Freq: Four times a day (QID) | INTRAMUSCULAR | Status: DC | PRN

## 2015-04-26 MED ORDER — SODIUM CHLORIDE 0.9 % IJ SOLN
3.0000 mL | Freq: Two times a day (BID) | INTRAMUSCULAR | Status: DC
  Administered 2015-04-26 – 2015-04-28 (×4): 3 mL via INTRAVENOUS

## 2015-04-26 MED ORDER — OXYCODONE HCL 5 MG PO TABS: 5.0000 mg | ORAL_TABLET | ORAL | Status: DC | PRN

## 2015-04-26 MED ORDER — TAMSULOSIN HCL 0.4 MG PO CAPS
0.4000 mg | ORAL_CAPSULE | Freq: Every day | ORAL | Status: DC
  Administered 2015-04-26 – 2015-04-28 (×3): 0.4 mg via ORAL
  Filled 2015-04-26 (×4): qty 1

## 2015-04-26 MED ORDER — SODIUM CHLORIDE 0.9 % IJ SOLN: 3.0000 mL | INTRAMUSCULAR | Status: DC | PRN

## 2015-04-26 MED ORDER — LACTULOSE 10 GM/15ML PO SOLN
30.0000 g | Freq: Three times a day (TID) | ORAL | Status: DC
  Administered 2015-04-26 – 2015-04-28 (×6): 30 g via ORAL
  Filled 2015-04-26 (×7): qty 45

## 2015-04-26 MED ORDER — DEXTROSE 5 % IV SOLN
2.0000 g | INTRAVENOUS | Status: DC
  Administered 2015-04-26 – 2015-04-27 (×2): 2 g via INTRAVENOUS
  Filled 2015-04-26 (×3): qty 2

## 2015-04-26 MED ORDER — ADULT MULTIVITAMIN W/MINERALS CH
1.0000 | ORAL_TABLET | Freq: Every day | ORAL | Status: DC
  Administered 2015-04-26 – 2015-04-28 (×3): 1 via ORAL
  Filled 2015-04-26 (×3): qty 1

## 2015-04-26 MED ORDER — INSULIN ASPART 100 UNIT/ML ~~LOC~~ SOLN
0.0000 [IU] | Freq: Three times a day (TID) | SUBCUTANEOUS | Status: DC
  Administered 2015-04-27: 3 [IU] via SUBCUTANEOUS
  Administered 2015-04-28: 2 [IU] via SUBCUTANEOUS

## 2015-04-26 MED ORDER — FLUOXETINE HCL 20 MG PO TABS
10.0000 mg | ORAL_TABLET | Freq: Every day | ORAL | Status: DC
Start: 2015-04-26 — End: 2015-04-26
  Filled 2015-04-26: qty 1

## 2015-04-26 MED ORDER — ALUM & MAG HYDROXIDE-SIMETH 200-200-20 MG/5ML PO SUSP: 30.0000 mL | Freq: Four times a day (QID) | ORAL | Status: DC | PRN

## 2015-04-26 MED ORDER — LIDOCAINE-EPINEPHRINE 2 %-1:100000 IJ SOLN
20.0000 mL | Freq: Once | INTRAMUSCULAR | Status: AC
  Administered 2015-04-26: 20 mL via INTRADERMAL
  Filled 2015-04-26: qty 1

## 2015-04-26 MED ORDER — BACLOFEN 5 MG HALF TABLET
5.0000 mg | ORAL_TABLET | Freq: Every day | ORAL | Status: DC
  Administered 2015-04-26 – 2015-04-27 (×2): 10 mg via ORAL
  Filled 2015-04-26 (×3): qty 2

## 2015-04-26 MED ORDER — GABAPENTIN 300 MG PO CAPS: 300.0000 mg | ORAL_CAPSULE | Freq: Three times a day (TID) | ORAL | Status: DC | PRN

## 2015-04-26 MED ORDER — DEXTROSE 5 % IV SOLN: 1.0000 g | Freq: Once | INTRAVENOUS | Status: DC

## 2015-04-26 MED ORDER — SODIUM CHLORIDE 0.9 % IV SOLN: 250.0000 mL | INTRAVENOUS | Status: DC | PRN

## 2015-04-26 NOTE — ED Notes (Signed)
Pt still unable to give urine.  

## 2015-04-26 NOTE — Telephone Encounter (Signed)
Before pt left he started vomiting. Dr. Silverio Decamp is sending the pt to the ER.

## 2015-04-26 NOTE — ED Notes (Signed)
Attempted to call report to floor and nurse states she will return call.

## 2015-04-26 NOTE — ED Notes (Addendum)
Pt presents with distended abdomen d/t cirrhosis. Last paracentesis was 10 days ago and approximately 8 liters were removed. Says he has to have paracentesis every 10 days. Was recently seen at [facility] for a "cervical spine infection." Wife is unsure what kind of infection-currently taking Amoxicillin. RR even/unlabored. Denies pain. Reports emesis this morning but denies other symptoms. Is on Lactulose management for cirrhosis management at this time. No other c/c. Abdomen is distended with bruising on lower bilateral quadrants.

## 2015-04-26 NOTE — ED Provider Notes (Signed)
CSN: [id number]     Arrival date & time 04/26/15  1117 History   First MD Initiated Contact with Patient 04/26/15 1125     Chief Complaint  Patient presents with  . Abdominal distention   . Cirrhosis     HPI   64 yo M PMH cirrhosis, NASH, portal HTN, GBS bacteremia and epidural abscess (April 2016; now on chronic high dose amoxicillin), pw 1 day history of AMS, lethargy, and distended abdomen. Patient endorses N/V (one episode this morning), but denies any other symptoms, including pain.   Mr. Hangartner is on lactulose for cirrhosis; however, according to historical records, compliance may be an issue. He has been seen for refractory ascites and underwent symptomatic paracentesis in July twice in August and again in late September. He was last seen on September 26 at which time he was instructed to continue Aldactone 200 mg daily and Lasix 60 mg daily along with his daily dose of Xifaxan and lactulose. Patient was seen by [facility] this morning for his third Twinrix shunt and was told to come to ER for confusion and lethargy. MD advised infectious workup, as well as diagnostic paracentesis for SBP workup.   Past Medical History  Diagnosis Date  . Reflux esophagitis   . Portal hypertension ([place])   . Stricture and stenosis of esophagus   . Diverticulosis of colon (without mention of hemorrhage)   . Internal hemorrhoids without mention of complication   . Benign neoplasm of colon   . Duodenitis without mention of hemorrhage   . 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 Personal history of diseases of blood and blood-forming organs   . 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   Marland Kitchen Unspecified essential hypertension   . Obesity, unspecified   . Type II or unspecified type diabetes mellitus without mention of complication, not stated as uncontrolled   . GERD (gastroesophageal reflux disease)   . Hemorrhoids   . Colon polyps 2004, 2006, 2008, 11/2011     Adenomatous and tubulovillous adenoma.  . Pancytopenia   . Calculus of kidney yrs ago  . Ascites    Past Surgical History  Procedure Laterality Date  . Kidney stone surgery  yrs ago  . Esophagogastroduodenoscopy  08/01/2011    Procedure: ESOPHAGOGASTRODUODENOSCOPY (EGD);  Surgeon: Scarlette Shorts, MD;  Location: [facility];  Service: Endoscopy;  Laterality: N/A;  . Esophagogastroduodenoscopy N/A 10/27/2012    Procedure: ESOPHAGOGASTRODUODENOSCOPY (EGD);  Surgeon: Inda Castle, MD;  Location: [facility];  Service: Endoscopy;  Laterality: N/A;  . Esophagogastroduodenoscopy N/A 11/22/2012    Procedure: ESOPHAGOGASTRODUODENOSCOPY (EGD);  Surgeon: Irene Shipper, MD;  Location: [facility];  Service: Endoscopy;  Laterality: N/A;  . Colonoscopy    . Upper gastrointestinal endoscopy    . Esophagogastroduodenoscopy N/A 11/29/2013    Procedure: ESOPHAGOGASTRODUODENOSCOPY (EGD);  Surgeon: Irene Shipper, MD;  Location: [facility];  Service: Endoscopy;  Laterality: N/A;  . Esophageal banding Bilateral 11/29/2013    Procedure: ESOPHAGEAL BANDING;  Surgeon: Irene Shipper, MD;  Location: [facility];  Service: Endoscopy;  Laterality: Bilateral;  . Esophagogastroduodenoscopy N/A 06/13/2014    Procedure: ESOPHAGOGASTRODUODENOSCOPY (EGD);  Surgeon: Irene Shipper, MD;  Location: [facility];  Service: Endoscopy;  Laterality: N/A;  . Esophageal banding N/A 06/13/2014    Procedure: ESOPHAGEAL BANDING;  Surgeon: Irene Shipper, MD;  Location: [facility];  Service: Endoscopy;  Laterality: N/A;   Family History  Problem Relation Age of Onset  .  Colon cancer Neg Hx   . Anesthesia problems Neg Hx   . Hypotension Neg Hx   . Malignant hyperthermia Neg Hx   . Pseudochol deficiency Neg Hx   . Diabetes Father   . Heart disease Father   . Aneurysm Mother   . Heart disease Other     neice  . Diabetes      niece   Social History  Substance Use Topics  . Smoking status: Never Smoker   . Smokeless tobacco: Never  Used  . Alcohol Use: No     Comment: very rare    Review of Systems  Gastrointestinal: Positive for nausea, vomiting and abdominal distention. Negative for abdominal pain, diarrhea and constipation.  Hematological: Bruises/bleeds easily.  Psychiatric/Behavioral: Positive for confusion.  All other systems reviewed and are negative.     Allergies  Review of patient's allergies indicates no known allergies.  Home Medications   Prior to Admission medications   Medication Sig Start Date End Date Taking? Authorizing Provider  amoxicillin (AMOXIL) 500 MG capsule Take 1 capsule (500 mg total) by mouth 3 (three) times daily. 01/16/15  Yes Truman Hayward, MD  baclofen (LIORESAL) 10 MG tablet Take 0.5-1 tablets (5-10 mg total) by mouth at bedtime. 04/11/15  Yes Meredith Staggers, MD  bethanechol (URECHOLINE) 50 MG tablet Take 1 tablet by mouth 2 (two) times daily.  12/19/14  Yes Historical Provider, MD  Dulaglutide 1.5 MG/0.5ML SOPN Inject 1.5 mg into the skin once a week. On thursdays   Yes Historical Provider, MD  FLUoxetine (PROZAC) 10 MG tablet Take 10 mg by mouth daily.   Yes Historical Provider, MD  furosemide (LASIX) 20 MG tablet TAKE 2 TABLETS EVERY DAY 03/30/15  Yes Irene Shipper, MD  gabapentin (NEURONTIN) 100 MG capsule Take 300 mg by mouth 3 (three) times daily as needed (May increase to 4 capsules every 8 hours as needed).    Yes Historical Provider, MD  Insulin Degludec (TRESIBA FLEXTOUCH) 200 UNIT/ML SOPN Inject 200 Units into the skin daily.   Yes Historical Provider, MD  lactulose (CHRONULAC) 10 GM/15ML solution Take 15 mLs (10 g total) by mouth 4 (four) times daily. 02/26/15  Yes Irene Shipper, MD  Multiple Vitamin (MULTIVITAMIN WITH MINERALS) TABS tablet Take 1 tablet by mouth daily.   Yes Historical Provider, MD  pantoprazole (PROTONIX) 40 MG tablet TAKE 1 TABLET (40 MG TOTAL) BY MOUTH DAILY. 11/13/14  Yes Irene Shipper, MD  spironolactone (ALDACTONE) 100 MG tablet Take 1 tablet  (100 mg total) by mouth 2 (two) times daily. Patient taking differently: Take 200 mg by mouth once.  02/05/15  Yes Irene Shipper, MD  tamsulosin (FLOMAX) 0.4 MG CAPS capsule Take 1 capsule by mouth daily. Take once daily by mouth 30 minutes after supper 12/19/14  Yes Historical Provider, MD  Vitamin D, Ergocalciferol, (DRISDOL) 50000 UNITS CAPS capsule Take 50,000 Units by mouth every 7 (seven) days. 03/29/15  Yes Historical Provider, MD  XIFAXAN 550 MG TABS tablet TAKE 1 TABLET (550 MG TOTAL) BY MOUTH 2 (TWO) TIMES DAILY. 03/30/15  Yes Irene Shipper, MD   BP 101/68 mmHg  Pulse 69  Temp(Src) 97.9 F (36.6 C) (Oral)  Resp 14  SpO2 99% Physical Exam  Constitutional: He is oriented to person, place, and time. He appears well-developed and well-nourished. No distress.  HENT:  Head: Normocephalic and atraumatic.  Mouth/Throat: Oropharynx is clear and moist. No oropharyngeal exudate.  Eyes: Pupils are equal,  round, and reactive to light. Right eye exhibits no discharge. Left eye exhibits no discharge. Scleral icterus is present.  Neck: No tracheal deviation present.  Cardiovascular: Normal rate, regular rhythm and intact distal pulses.  Exam reveals no gallop and no friction rub.   Murmur heard. Pulmonary/Chest: Effort normal and breath sounds normal. No respiratory distress. He has no wheezes. He has no rales. He exhibits no tenderness.  Abdominal: He exhibits distension. He exhibits no mass. There is no tenderness. There is no rebound and no guarding.  Bruising present on lower abdomen BL (wife attributes this to "shots" he has gotten).  Musculoskeletal: He exhibits no edema or tenderness.  Lymphadenopathy:    He has no cervical adenopathy.  Neurological: He is alert and oriented to person, place, and time. Coordination normal.  Asterixis   Skin: Skin is warm and dry. No rash noted. He is not diaphoretic. No erythema.  Psychiatric: He has a normal mood and affect. His behavior is normal.   Lethargic.   Nursing note and vitals reviewed.   ED Course  Procedures  Labs Review Labs Reviewed  BASIC METABOLIC PANEL - Abnormal; Notable for the following:    Sodium 133 (*)    Glucose, Bld 236 (*)    BUN 22 (*)    Creatinine, Ser 1.25 (*)    All other components within normal limits  CBC - Abnormal; Notable for the following:    WBC 3.5 (*)    RBC 3.70 (*)    Hemoglobin 11.4 (*)    HCT 32.8 (*)    RDW 15.6 (*)    Platelets 34 (*)    All other components within normal limits  LIPASE, BLOOD - Abnormal; Notable for the following:    Lipase 52 (*)    All other components within normal limits  AMMONIA - Abnormal; Notable for the following:    Ammonia 191 (*)    All other components within normal limits  COMPREHENSIVE METABOLIC PANEL - Abnormal; Notable for the following:    Sodium 132 (*)    Chloride 100 (*)    Glucose, Bld 237 (*)    BUN 21 (*)    Creatinine, Ser 1.28 (*)    Albumin 3.4 (*)    Total Bilirubin 1.4 (*)    GFR calc non Af Amer 58 (*)    All other components within normal limits  PROTIME-INR - Abnormal; Notable for the following:    Prothrombin Time 17.1 (*)    All other components within normal limits  I-STAT CG4 LACTIC ACID, ED - Abnormal; Notable for the following:    Lactic Acid, Venous 2.04 (*)    All other components within normal limits  CULTURE, BLOOD (ROUTINE X 2)  CULTURE, BLOOD (ROUTINE X 2)  BODY FLUID CULTURE  URINALYSIS, ROUTINE W REFLEX MICROSCOPIC (NOT AT [facility])  LACTATE DEHYDROGENASE, BODY FLUID  GLUCOSE, PERITONEAL FLUID  PROTEIN, BODY FLUID  ALBUMIN, FLUID  CBG MONITORING, ED  I-STAT CG4 LACTIC ACID, ED    Imaging Review Dg Chest Portable 1 View  04/26/2015  CLINICAL DATA:  Altered mental status.  Lethargy. EXAM: PORTABLE CHEST 1 VIEW COMPARISON:  01/30/2015 FINDINGS: Very low lung volumes are seen with bibasilar atelectasis. No evidence of pulmonary consolidation or pleural effusion. Heart size is within normal limits  allowing for low lung volumes. IMPRESSION: Low lung volumes and mild bibasilar atelectasis. Electronically Signed   By: Earle Gell M.D.   On: 04/26/2015 13:22   I have personally reviewed  and evaluated these images and lab results as part of my medical decision-making.   EKG Interpretation   Date/Time:  Thursday April 26 2015 11:29:37 EDT Ventricular Rate:  75 PR Interval:  243 QRS Duration: 92 QT Interval:  419 QTC Calculation: 468 R Axis:   94 Text Interpretation:  Sinus rhythm Prolonged PR interval Right axis  deviation Low voltage, precordial leads Anteroseptal infarct, old  Borderline T abnormalities, inferior leads ED PHYSICIAN INTERPRETATION  AVAILABLE IN [facility] Confirmed by TEST, Record ([id number]) on  04/27/2015 7:24:24 AM      MDM   Final diagnoses:  Altered mental status, unspecified altered mental status type   Pt presenting with new onset AMS and lethargy. Ordered EKG, CXR, lipase, CBC, CMP, CBG, ammonia, lactic acid, blood culture x 2, PT-INR. Will do paracentesis.   Consent obtained. Paracentesis performed by Dr. Dayna Barker. Sent fluid to lab.   Pt afebrile, vital signs stable. Ammonia 191, lipase 52. Will order consult for admission for AMS, hepatic encephalopathy. Patient and spouse understanding and in agreement with the plan.   Spoke with Dr. Grandville Silos who advised admission to step-down bed, rocephin, and head CT without contrast.     Claremore Lions, PA-C 04/28/15 1527  Merrily Pew, MD 05/01/15 1453

## 2015-04-26 NOTE — Progress Notes (Signed)
Patient is more confused this AM, off balance. He stopped taking Rifaximin about 2 or 3 days ago, but continued lactulose and had a BM this morning. No fever or chills. Abominal girth increasing and may have gained ~10 lbs in past few weeks  On exam  Afebrile Somnolent, unsteady gait Asterixis++ S1s2 B/l lungs clear, with normal respiratory effort Abdomen distended, shifting dullness +, BS+ trace pedal edema  11 yr M with decompensated cirrhosis with worsening hepatic encephalopathy  ER for evaluation  Please admit to Hospitalist service  Will need labs including LFT and infectious work up diagnostic paracentesis to rule out spontaneous bacterial peritonitis  K. Denzil Magnuson , MD

## 2015-04-26 NOTE — ED Notes (Signed)
Per pt, states fluid build up in abdomin for over a couple of weeks-history of cirrhosis

## 2015-04-26 NOTE — H&P (Signed)
[facility] History and Physical  JONPAUL LUMM VVZ:[id number] DOB: Dec 01, 1950 DOA: 04/26/2015  Referring physician: Dr Dolly Rias PCP: [name], MD  Gastroenterologist [name] 04/26/2015 Infectious disease Dr. Tommy Medal Neurosurgery: Dr. Saintclair Halsted  Chief Complaint: Abdominal distention/confusion  HPI: [name] is a 64 y.o. 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 Patient had not taken his Xifaxan on lactulose in a few days. Patient was also noted to be lethargic. Patient was subsequently sent to the ED to be admitted for further evaluation and management. Patient is sleepy however easily arousable but falls back to sleep however answers questions appropriately with wife at bedside. Patient states that he's had increasing abdominal distention over the past 10 days. Patient endorses confusion for the past 1 day with some associated nausea, emesis, diffuse abdominal pain, constipation. Patient denies any fevers, no chills, no cough, no chest pain, no shortness of breath, no dysuria, no melena, no hematemesis, no hematochezia, no wheezing. Patient does endorse a generalized weakness and lethargy. Patient was seen in the emergency room ammonia level drawn was 191. Comprehensive metabolic profile had a sodium of 132 chloride of 100 BUN of 21 creatinine of 1.28 glucose of 237 total bilirubin of 1.4 otherwise was within normal limits.  Lactic acid level was 2.04. CBC had a white count of 3.5 hemoglobin of 11.4 and a platelet count of 34. INR was 1.38. PT was 17.1. Chest x-ray was negative for any acute infiltrate. Patient underwent a diagnostic paracentesis with 40 mL removed. EKG shows some T-wave inversions in leads 3 and aVF which is old, right axis deviation, prolonged PR interval of 243. [facility] were called to admit the patient for further evaluation and management.   Review of Systems: As per history of present illness otherwise negative. Constitutional:  No weight loss, night sweats, Fevers, chills, fatigue.  HEENT:  No headaches, Difficulty swallowing,Tooth/dental problems,Sore throat,  No sneezing, itching, ear ache, nasal congestion, post nasal drip,  Cardio-vascular:  No chest pain, Orthopnea, PND, swelling in lower extremities, anasarca, dizziness, palpitations  GI:  No heartburn, indigestion, abdominal pain, nausea, vomiting, diarrhea, change in bowel habits, loss of appetite  Resp:  No shortness of breath with exertion or at rest. No excess mucus, no productive cough, No non-productive cough, No coughing up of blood.No change in color of mucus.No wheezing.No chest wall deformity  Skin:  no rash or lesions.  GU:  no dysuria, change in color of urine, no urgency or frequency. No flank pain.  Musculoskeletal:  No joint pain or swelling. No decreased range of motion. No back pain.  Psych:  No change in mood or affect. No depression or anxiety. No memory loss.   Past Medical History  Diagnosis Date  . Reflux esophagitis   . Portal hypertension ([place])   . Stricture and stenosis of esophagus   . Diverticulosis of colon (without mention of hemorrhage)   . Internal hemorrhoids without mention of complication   . Benign neoplasm of colon   . Duodenitis without  mention of hemorrhage   . 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 Personal history of diseases of blood and blood-forming organs    . 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   Marland Kitchen Unspecified essential hypertension   . Obesity, unspecified   . Type II or unspecified type diabetes mellitus without mention of complication, not stated as uncontrolled   . GERD (gastroesophageal reflux disease)   . Hemorrhoids   . Colon polyps 2004, 2006, 2008, 11/2011    Adenomatous and tubulovillous adenoma.  . Pancytopenia   . Calculus of kidney yrs ago  . Ascites    Past Surgical History  Procedure Laterality Date  . Kidney stone surgery  yrs ago  . Esophagogastroduodenoscopy  08/01/2011    Procedure: ESOPHAGOGASTRODUODENOSCOPY (EGD);  Surgeon: Scarlette Shorts, MD;  Location: [facility];  Service: Endoscopy;  Laterality: N/A;  . Esophagogastroduodenoscopy N/A 10/27/2012    Procedure: ESOPHAGOGASTRODUODENOSCOPY (EGD);  Surgeon: Inda Castle, MD;  Location: [facility];  Service: Endoscopy;  Laterality: N/A;  . Esophagogastroduodenoscopy N/A 11/22/2012    Procedure: ESOPHAGOGASTRODUODENOSCOPY (EGD);  Surgeon: Irene Shipper, MD;  Location: [facility];  Service: Endoscopy;  Laterality: N/A;  . Colonoscopy    . Upper gastrointestinal endoscopy    . Esophagogastroduodenoscopy N/A 11/29/2013    Procedure: ESOPHAGOGASTRODUODENOSCOPY (EGD);  Surgeon: Irene Shipper, MD;  Location: [facility];  Service: Endoscopy;  Laterality: N/A;  . Esophageal banding Bilateral 11/29/2013    Procedure: ESOPHAGEAL BANDING;  Surgeon: Irene Shipper, MD;  Location: [facility];  Service: Endoscopy;  Laterality: Bilateral;  . Esophagogastroduodenoscopy N/A 06/13/2014    Procedure: ESOPHAGOGASTRODUODENOSCOPY (EGD);  Surgeon: Irene Shipper, MD;  Location: [facility];  Service: Endoscopy;  Laterality: N/A;  . Esophageal banding N/A 06/13/2014    Procedure: ESOPHAGEAL BANDING;  Surgeon: Irene Shipper, MD;  Location: [facility];  Service: Endoscopy;  Laterality: N/A;   Social History:  reports that he has never smoked. He has never used  smokeless tobacco. He reports that he does not drink alcohol or use illicit drugs.  No Known Allergies  Family History  Problem Relation Age of Onset  . Colon cancer Neg Hx   . Anesthesia problems Neg Hx   . Hypotension Neg Hx   . Malignant hyperthermia Neg Hx   . Pseudochol deficiency Neg Hx   . Diabetes Father   . Heart disease Father   . Aneurysm Mother   . Heart disease Other     [place]  . Diabetes      niece   mother recently deceased at age 64 2 weeks ago from complications from a colonic perforation. Father deceased at 23 from old age.  Prior to Admission medications   Medication Sig Start Date End Date Taking? Authorizing Provider  amoxicillin (AMOXIL) 500 MG capsule Take 1 capsule (500 mg total) by mouth 3 (three) times daily. 01/16/15  Yes Truman Hayward, MD  baclofen (LIORESAL) 10 MG tablet Take 0.5-1 tablets (5-10 mg total) by mouth at bedtime. 04/11/15  Yes Meredith Staggers, MD  bethanechol (URECHOLINE) 50 MG tablet Take 1 tablet by mouth 2 (two) times daily.  12/19/14  Yes Historical Provider, MD  Dulaglutide 1.5 MG/0.5ML SOPN Inject 1.5 mg into the skin once a week. On thursdays   Yes Historical Provider, MD  FLUoxetine (PROZAC) 10 MG tablet Take 10 mg by mouth daily.   Yes Historical Provider, MD  furosemide (LASIX) 20 MG tablet TAKE  2 TABLETS EVERY DAY 03/30/15  Yes Irene Shipper, MD  gabapentin (NEURONTIN) 100 MG capsule Take 300 mg by mouth 3 (three) times daily as needed (May increase to 4 capsules every 8 hours as needed).    Yes Historical Provider, MD  Insulin Degludec (TRESIBA FLEXTOUCH) 200 UNIT/ML SOPN Inject 200 Units into the skin daily.   Yes Historical Provider, MD  lactulose (CHRONULAC) 10 GM/15ML solution Take 15 mLs (10 g total) by mouth 4 (four) times daily. 02/26/15  Yes Irene Shipper, MD  Multiple Vitamin (MULTIVITAMIN WITH MINERALS) TABS tablet Take 1 tablet by mouth daily.   Yes Historical Provider, MD  pantoprazole (PROTONIX) 40 MG tablet TAKE 1  TABLET (40 MG TOTAL) BY MOUTH DAILY. 11/13/14  Yes Irene Shipper, MD  spironolactone (ALDACTONE) 100 MG tablet Take 1 tablet (100 mg total) by mouth 2 (two) times daily. Patient taking differently: Take 200 mg by mouth once.  02/05/15  Yes Irene Shipper, MD  tamsulosin (FLOMAX) 0.4 MG CAPS capsule Take 1 capsule by mouth daily. Take once daily by mouth 30 minutes after supper 12/19/14  Yes Historical Provider, MD  Vitamin D, Ergocalciferol, (DRISDOL) 50000 UNITS CAPS capsule Take 50,000 Units by mouth every 7 (seven) days. 03/29/15  Yes Historical Provider, MD  XIFAXAN 550 MG TABS tablet TAKE 1 TABLET (550 MG TOTAL) BY MOUTH 2 (TWO) TIMES DAILY. 03/30/15  Yes Irene Shipper, MD   Physical Exam: Filed Vitals:   04/26/15 1130 04/26/15 1248 04/26/15 1405 04/26/15 1628  BP: 128/74 118/72 101/68 104/72  Pulse: 69 70 69 71  Temp: 97.9 F (36.6 C)   97.9 F (36.6 C)  TempSrc: Oral   Temporal  Resp: 13 12 14 12   SpO2: 100% 98% 99% 99%    Wt Readings from Last 3 Encounters:  04/18/15 79.379 kg (175 lb)  04/04/15 75.751 kg (167 lb)  03/14/15 75.751 kg (167 lb)    General:  Well-developed well-nourished laying on gurney drowsy and lethargic however easily arousable speaking in full sentences however drifts back off to sleep. Asterixis present Eyes: PERRLA, EOMI, normal lids, irises & conjunctiva ENT: grossly normal hearing, lips & tongue Neck: no LAD, masses or thyromegaly Cardiovascular: RRR, no m/r/g. No LE edema. Respiratory: CTA bilaterally, no w/r/r. Normal respiratory effort. Abdomen: soft, mildly distended, shifting dullness, positive bowel sounds, some diffuse tenderness to palpation, no rebound, no guarding. Skin: no rash or induration seen on limited exam Musculoskeletal: grossly normal tone BUE/BLE Psychiatric: grossly normal mood and affect, speech fluent and appropriate Neurologic: Drowsy however easily arousable and following commands. CN2-12grossly intact. No focal deficits. Asterixis  present           Labs on Admission:  Basic Metabolic Panel:  Recent Labs Lab 04/26/15 1140 04/26/15 1214  NA 133* 132*  K 4.4 4.4  CL 101 100*  CO2 26 26  GLUCOSE 236* 237*  BUN 22* 21*  CREATININE 1.25* 1.28*  CALCIUM 9.3 9.2   Liver Function Tests:  Recent Labs Lab 04/26/15 1214  AST 34  ALT 26  ALKPHOS 73  BILITOT 1.4*  PROT 7.1  ALBUMIN 3.4*    Recent Labs Lab 04/26/15 1140  LIPASE 52*    Recent Labs Lab 04/26/15 1214  AMMONIA 191*   CBC:  Recent Labs Lab 04/26/15 1140  WBC 3.5*  HGB 11.4*  HCT 32.8*  MCV 88.6  PLT 34*   Cardiac Enzymes: No results for input(s): CKTOTAL, CKMB, CKMBINDEX, TROPONINI in the last  168 hours.  BNP (last 3 results) No results for input(s): BNP in the last 8760 hours.  ProBNP (last 3 results) No results for input(s): PROBNP in the last 8760 hours.  CBG: No results for input(s): GLUCAP in the last 168 hours.  Radiological Exams on Admission: Dg Chest Portable 1 View  04/26/2015  CLINICAL DATA:  Altered mental status.  Lethargy. EXAM: PORTABLE CHEST 1 VIEW COMPARISON:  01/30/2015 FINDINGS: Very low lung volumes are seen with bibasilar atelectasis. No evidence of pulmonary consolidation or pleural effusion. Heart size is within normal limits allowing for low lung volumes. IMPRESSION: Low lung volumes and mild bibasilar atelectasis. Electronically Signed   By: Earle Gell M.D.   On: 04/26/2015 13:22    EKG: Independently reviewed. Normal sinus rhythm. Right axis deviation. T-wave inversions in leads 3 and aVF which is old. Prolonged PR interval of 243.  Assessment/Plan Principal Problem:   Acute encephalopathy Active Problems:   Acute hepatic encephalopathy (HCC)   Pancytopenia (HCC)   Essential hypertension   Esophageal varices (HCC)   ESOPHAGITIS, REFLUX   GERD   Constipation   Hepatic cirrhosis (HCC)   Portal hypertension (HCC)   Abdominal pain, generalized   ASCITES   IRON DEFICIENCY ANEMIA, HX  OF   cervical Epidural abscess   Encephalopathy acute   Streptococcal bacteremia   NASH (nonalcoholic steatohepatitis)   Diabetes mellitus type 2, uncontrolled (HCC)   Thrombocytopenia (HCC)   Group B streptococcal infection   Ascites   Hyperammonemia (HCC)   Altered mental status  #1 acute encephalopathy/hepatic encephalopathy Likely secondary to hepatic encephalopathy and patient noted to have an ammonia level of 191 who has been noncompliant with his lactulose and Xifaxan. Chest x-ray negative for any acute infiltrate. UA is pending. Blood cultures have been ordered and are pending. Patient underwent a diagnostic paracentesis with 40 mL of fluid removed with cultures pending. Will check a head CT. Placed on Xifaxan and lactulose. Placed empirically on IV Rocephin 2 g daily until results from paracenteses are available. GI following and appreciate input and recommendations.  #2 cirrhosis with abdominal ascites Patient complaining of some diffuse abdominal pain. Patient presenting with an acute encephalopathy, asterixis, and has been noncompliant with his medications. Patient denies any overt bleeding. Patient underwent a diagnostic paracentesis in the ED with 40 mL of fluid removed and results sent off for cultures. Patient has been pancultured. We'll have interventional radiology see patient for ultrasound-guided diagnostic and therapeutic paracentesis. Continue Xifaxan and lactulose to achieve 3-5 bowel movements daily. Will place empirically on IV Rocephin 2 g daily. Continue home regimen of Lasix 40 mg daily. Will place on Aldactone 100 mg daily.  #3 group B streptococcal infection/epidural abscess Currently stable. Patient is currently afebrile. Patient's presentation likely secondary to hepatic encephalopathy. Patient was chronically on amoxicillin. Will hold amoxicillin while patient on IV Rocephin and resume amoxicillin was Rocephin has been discontinued. Outpatient follow-up with  neurosurgery and ID.  #4 chronic thrombocytopenia Likely secondary to problem #2. No overt bleeding. Follow.  #5 pancytopenia Likely secondary to problem #2. Follow.  #6 diabetes mellitus Check a hemoglobin A1c. Continue home regimen of Tresiba. Sliding scale insulin.  #7 gastroesophageal reflux disease Continue home regimen of Protonix.  #8 prophylaxis PPI for GI prophylaxis. SCDs for DVT prophylaxis.  Code Status: DNR DVT Prophylaxis:SCD Family Communication: updated patient and wife at bedside. Disposition Plan: Admit to SDU  Time spent: 47 mins  [facility] MD [facility] Pager [phone]

## 2015-04-26 NOTE — Telephone Encounter (Signed)
Pt here today for last Twinrix injection. Pt quite confused. Had stopped taking his lactulose and xifaxan. States that the xifaxan constipated him. Quintin Alto CMA spoke with doc of the day Dr. Silverio Decamp and pt was instructed to start taking his lactulose again as he is supposed to. Pt is asking about having a paracentesis done. Pts abdomen is extended. Please advise.

## 2015-04-26 NOTE — ED Notes (Signed)
RN will draw the second blood cultures

## 2015-04-26 NOTE — ED Notes (Signed)
Pt currently in CT.

## 2015-04-26 NOTE — Consult Note (Addendum)
Referring Provider: [facility] Primary Care Physician:  [name], MD Primary Gastroenterologist:  [name]  Reason for Consultation:  Hepatic encephalopathy, NASH cirrhosis  HPI: [name] is a 64 y.o. male 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 He has had variceal bleeding, and encephalopathy as well as ascites and liver disease. He is also followed at the [facility] liver clinic peripherally. 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 Not a candidate for anticoagulation secondary to history massive GI bleeds in the past. 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 He has been managed by infectious disease. He has been seen for refractory ascites and underwent paracentesis in July twice in August and again in late September. Between 2-8 L have been removed a different sessions. He was last seen on September 26 at which time he was instructed to continue Aldactone 200 mg daily and Lasix 60 mg daily along with his daily dose of Xifaxan and lactulose. Patient's wife says that the patient's mother passed away on Apr 22, 2023, and since that time between funeral arrangements and handling the estate, the patient has been very busy and has not been taking his lactulose and Xifaxan regularly. She reports that he says these medications cause constipation and doesn't like to take them. He has been seen at the [facility] liver but due to his [name] and thrombocytopenia he was felt to not be a good surgical candidate. He has been on Rocephin for his accident but was changed to high-dose amoxicillin. He recently had an MRI of cervical and lumbar spine and the cervical spine MRI revealed residual infection and discitis at C3, and decision was made to continue nonoperative management protracted antibiotics. Wife reports that he had had admission  [facility] after having discontinued his lactulose and becoming confused. Patient was seen in the GI office this morning for his third Twinrix shunt and was noted by the staff to be quite confused. His wife said she did not notice any abnormal confusion until last evening, and she reports that when driving in [place] today he was unable to tell her how to get to the GI office. He was advised by the GI office staff to come to the emergency room. His wife says he has not been having any fever, chills or night sweats. She says that despite him feeling constipated he was still moving his bowels once or twice daily.   Past Medical History  Diagnosis Date  . Reflux esophagitis   . Portal hypertension ([place])   . Stricture and stenosis of esophagus   . Diverticulosis of colon (without mention of hemorrhage)   . Internal hemorrhoids without mention of complication   . Benign neoplasm of colon   . Duodenitis without mention of hemorrhage   . 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 Personal history of diseases of blood and blood-forming organs   . 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   Marland Kitchen Unspecified essential hypertension   . Obesity, unspecified   . Type II or unspecified type diabetes mellitus without mention of complication, not stated as uncontrolled   . GERD (gastroesophageal reflux disease)   . Hemorrhoids   . Colon polyps 2004, 2006, 2008, 11/2011    Adenomatous and tubulovillous adenoma.  . Pancytopenia   . Calculus of kidney yrs ago  . Ascites  Past Surgical History  Procedure Laterality Date  . Kidney stone surgery  yrs ago  . Esophagogastroduodenoscopy  08/01/2011    Procedure: ESOPHAGOGASTRODUODENOSCOPY (EGD);  Surgeon: Scarlette Shorts, MD;  Location: [facility];  Service: Endoscopy;  Laterality: N/A;  . Esophagogastroduodenoscopy N/A 10/27/2012    Procedure: ESOPHAGOGASTRODUODENOSCOPY (EGD);  Surgeon: Inda Castle, MD;  Location: [facility]  ENDOSCOPY;  Service: Endoscopy;  Laterality: N/A;  . Esophagogastroduodenoscopy N/A 11/22/2012    Procedure: ESOPHAGOGASTRODUODENOSCOPY (EGD);  Surgeon: Irene Shipper, MD;  Location: [facility] ENDOSCOPY;  Service: Endoscopy;  Laterality: N/A;  . Colonoscopy    . Upper gastrointestinal endoscopy    . Esophagogastroduodenoscopy N/A 11/29/2013    Procedure: ESOPHAGOGASTRODUODENOSCOPY (EGD);  Surgeon: Irene Shipper, MD;  Location: [facility];  Service: Endoscopy;  Laterality: N/A;  . Esophageal banding Bilateral 11/29/2013    Procedure: ESOPHAGEAL BANDING;  Surgeon: Irene Shipper, MD;  Location: [facility] ENDOSCOPY;  Service: Endoscopy;  Laterality: Bilateral;  . Esophagogastroduodenoscopy N/A 06/13/2014    Procedure: ESOPHAGOGASTRODUODENOSCOPY (EGD);  Surgeon: Irene Shipper, MD;  Location: [facility];  Service: Endoscopy;  Laterality: N/A;  . Esophageal banding N/A 06/13/2014    Procedure: ESOPHAGEAL BANDING;  Surgeon: Irene Shipper, MD;  Location: [facility] ENDOSCOPY;  Service: Endoscopy;  Laterality: N/A;    Prior to Admission medications   Medication Sig Start Date End Date Taking? Authorizing Provider  amoxicillin (AMOXIL) 500 MG capsule Take 1 capsule (500 mg total) by mouth 3 (three) times daily. 01/16/15   Truman Hayward, MD  baclofen (LIORESAL) 10 MG tablet Take 0.5-1 tablets (5-10 mg total) by mouth at bedtime. 04/11/15   Meredith Staggers, MD  bethanechol (URECHOLINE) 50 MG tablet Take 1 tablet by mouth 2 (two) times daily.  12/19/14   Historical Provider, MD  Dulaglutide 1.5 MG/0.5ML SOPN Inject 1.5 mg into the skin once a week.    Historical Provider, MD  ENSURE PLUS (ENSURE PLUS) LIQD Take 237 mLs by mouth as needed.    Historical Provider, MD  FLUoxetine (PROZAC) 10 MG tablet Take 10 mg by mouth daily.    Historical Provider, MD  furosemide (LASIX) 20 MG tablet TAKE 2 TABLETS EVERY DAY 03/30/15   Irene Shipper, MD  gabapentin (NEURONTIN) 100 MG capsule Take 100 mg by mouth 3 (three) times daily as needed (May  increase to 4 capsules every 8 hours as needed).    Historical Provider, MD  Insulin Degludec (TRESIBA FLEXTOUCH) 200 UNIT/ML SOPN Inject 200 Units into the skin daily.    Historical Provider, MD  lactulose (CHRONULAC) 10 GM/15ML solution Take 15 mLs (10 g total) by mouth 4 (four) times daily. 02/26/15   Irene Shipper, MD  Nutritional Supplements (GLUCERNA PO) Take by mouth as needed.    Historical Provider, MD  pantoprazole (PROTONIX) 40 MG tablet TAKE 1 TABLET (40 MG TOTAL) BY MOUTH DAILY. 11/13/14   Irene Shipper, MD  spironolactone (ALDACTONE) 100 MG tablet Take 1 tablet (100 mg total) by mouth 2 (two) times daily. 02/05/15   Irene Shipper, MD  tamsulosin (FLOMAX) 0.4 MG CAPS capsule Take 1 capsule by mouth daily. Take once daily by mouth 30 minutes after supper 12/19/14   Historical Provider, MD  XIFAXAN 550 MG TABS tablet TAKE 1 TABLET (550 MG TOTAL) BY MOUTH 2 (TWO) TIMES DAILY. 03/30/15   Irene Shipper, MD    No current facility-administered medications for this encounter.   Current Outpatient Prescriptions  Medication Sig Dispense Refill  .  amoxicillin (AMOXIL) 500 MG capsule Take 1 capsule (500 mg total) by mouth 3 (three) times daily. 90 capsule 11  . baclofen (LIORESAL) 10 MG tablet Take 0.5-1 tablets (5-10 mg total) by mouth at bedtime. 30 tablet 2  . bethanechol (URECHOLINE) 50 MG tablet Take 1 tablet by mouth 2 (two) times daily.     . Dulaglutide 1.5 MG/0.5ML SOPN Inject 1.5 mg into the skin once a week.    . ENSURE PLUS (ENSURE PLUS) LIQD Take 237 mLs by mouth as needed.    Marland Kitchen FLUoxetine (PROZAC) 10 MG tablet Take 10 mg by mouth daily.    . furosemide (LASIX) 20 MG tablet TAKE 2 TABLETS EVERY DAY 60 tablet 3  . gabapentin (NEURONTIN) 100 MG capsule Take 100 mg by mouth 3 (three) times daily as needed (May increase to 4 capsules every 8 hours as needed).    . Insulin Degludec (TRESIBA FLEXTOUCH) 200 UNIT/ML SOPN Inject 200 Units into the skin daily.    Marland Kitchen lactulose (CHRONULAC) 10 GM/15ML  solution Take 15 mLs (10 g total) by mouth 4 (four) times daily. 1892 mL 3  . Nutritional Supplements (GLUCERNA PO) Take by mouth as needed.    . pantoprazole (PROTONIX) 40 MG tablet TAKE 1 TABLET (40 MG TOTAL) BY MOUTH DAILY. 30 tablet 5  . spironolactone (ALDACTONE) 100 MG tablet Take 1 tablet (100 mg total) by mouth 2 (two) times daily. 180 tablet 2  . tamsulosin (FLOMAX) 0.4 MG CAPS capsule Take 1 capsule by mouth daily. Take once daily by mouth 30 minutes after supper    . XIFAXAN 550 MG TABS tablet TAKE 1 TABLET (550 MG TOTAL) BY MOUTH 2 (TWO) TIMES DAILY. 60 tablet 0    Allergies as of 04/26/2015  . (No Known Allergies)    Family History  Problem Relation Age of Onset  . Colon cancer Neg Hx   . Anesthesia problems Neg Hx   . Hypotension Neg Hx   . Malignant hyperthermia Neg Hx   . Pseudochol deficiency Neg Hx   . Diabetes Father   . Heart disease Father   . Aneurysm Mother   . Heart disease Other     neice  . Diabetes      niece    Social History   Social History  . Marital Status: Married    Spouse Name: N/A  . Number of Children: N/A  . Years of Education: N/A   Occupational History  . retired    Social History Main Topics  . Smoking status: Never Smoker   . Smokeless tobacco: Never Used  . Alcohol Use: No     Comment: very rare  . Drug Use: No  . Sexual Activity: No   Other Topics Concern  . Not on file   Social History Narrative   ENDOSCOPIES: Last colonoscopy September 2013 with tubulovillous adenoma. Follow-up in 3 years recommended. EGD 06/13/14: ENDOSCOPIC IMPRESSION: 1. Distal esophageal scarring and small mid esophageal varices. No banding required 2. Portal gastropathy (moderate) RECOMMENDATIONS: 1. Routine office appointment with [name] in 6 months 2. Repeat upper Endoscopy with possible banding in 1 year  EGD 11/29/13: ENDOSCOPIC IMPRESSION: 1. Esophageal varices status post band ligation x2 2. Portal  gastropathy RECOMMENDATIONS: 1. Clear liquids until 2 PM, then soft foods rest of day. Resume prior diet tomorrow. 2. Continue current medications 3. Office visit in 3 months. Repeat Endoscopy in 6 months  EGD 11/23/14:mild portal gastropathy EGD 10/27/12:bleeding post banding EGD 08/01/11: grade 1  varix distal esophagus, portal gastropathy. EGD 12/11/10: Grade 2 varix distal esophagus, portal gastropathy EGD 04/26/2010: Grade 2 varices distal esophagus   Review of Systems: Unable to obtain as patient is not able to answer questions to his severe lethargy  Physical Exam: Vital signs in last 24 hours: Temp:  [97.9 F (36.6 C)-98.2 F (36.8 C)] 98.2 F (36.8 C) (10/20 1222) Pulse Rate:  [69-70] 70 (10/20 1248) Resp:  [12-13] 12 (10/20 1248) BP: (118-128)/(72-74) 118/72 mmHg (10/20 1248) SpO2:  [98 %-100 %] 98 % (10/20 1248)   General:   Lethargic  Well-developed, well-nourished, sleeping Head:  Normocephalic and atraumatic. Eyes:  Sclera clear, no icterus. Conjunctiva pink. Ears:  Normal auditory acuity. Nose:  No deformity, discharge,  or lesions. Mouth:  No deformity or lesions.   Neck:  Supple; no masses or thyromegaly. Lungs:  Clear throughout to auscultation.   No wheezes, crackles, or rhonchi . Heart:  Regular rate and rhythm; no murmurs, clicks, rubs,  or gallops. Abdomen:  Soft,nontender, multiple ecchymotic areas, moderate distention and no fluid wave appreciated, BS active,nonpalp mass or hsm.   Rectal:  Deferred  Msk:  Symmetrical without gross deformities. . Pulses:  Normal pulses noted. Extremities: Without clubbing or edema. Neurologic: Lethargic, sleepy Skin: Intact without significant lesions or rashes..   Lab Results:  Recent Labs  04/26/15 1140  WBC 3.5*  HGB 11.4*  HCT 32.8*  PLT 34*   BMET  Recent Labs  04/26/15 1140 04/26/15 1214  NA 133* 132*  K 4.4 4.4  CL 101 100*  CO2 26 26  GLUCOSE 236* 237*  BUN 22* 21*  CREATININE 1.25* 1.28*   CALCIUM 9.3 9.2   LFT  Recent Labs  04/26/15 1214  PROT 7.1  ALBUMIN 3.4*  AST 34  ALT 26  ALKPHOS 73  BILITOT 1.4*   Ammonia 191 Lipase 52  Studies/Results: Dg Chest Portable 1 View  04/26/2015  CLINICAL DATA:  Altered mental status.  Lethargy. EXAM: PORTABLE CHEST 1 VIEW COMPARISON:  01/30/2015 FINDINGS: Very low lung volumes are seen with bibasilar atelectasis. No evidence of pulmonary consolidation or pleural effusion. Heart size is within normal limits allowing for low lung volumes. IMPRESSION: Low lung volumes and mild bibasilar atelectasis. Electronically Signed   By: Earle Gell M.D.   On: 04/26/2015 13:22    IMPRESSION/PLAN: 64 year old male with [name] cirrhosis, portal gastropathy, varices, thrombocytopenia, status post recent cervical epidural abscess for which he is on chronic amoxicillin, now with change in mental status. This is likely due to to hepatic encephalopathy from noncompliance with his medication regimen. Would restart Xifaxan 550 twice a day and Chronulac 30 mL 3 times a day. Trend ammonia levels .Would benefit from diagnostic paracentesis to rule out SBP as well. Will check prothrombin time/INR. Will review with attending as to further recommendations.  History 64 year old abscess/discitis on chronic amoxicillin. Diabetes mellitus type 2 Chronic pain, BPH  Hvozdovic, Vita Barley PA-C 04/26/2015,  Pager [phone]  Mon-Fri 8a-5p [phone] after 5p, weekends, holidays  GI ATTENDING  History, labs, x-rays,endoscopy reports reviewed. Patient seen and examined. Agree with above consultation note. Patient well known to me with NASH related hepatic cirrhosis with prior complications of variceal bleed, ascites, and encephalopathy. Had been doing well and compensated until he developed cervical epidural abscess in April (followed by ID and Neurosurgery). Since then, problems with recurrent encephalopathy and resistant ascites with overall debilitation. Admitted now  with significant encephalopathy. May be due to being off meds, but need to  rule out precipitating causes (e.g., bleeding, infection - including SBP). Agree with monitoring stools and H/H as well as tap ascites (large volume with albumin replacement if needed) and send fluid for cell count with differential. Will resume Xifaxan and lactulose to achieve 3-5 BM daily... Will follow. Thanks   Docia Chuck. Geri Seminole., M.D. [facility] Division of Gastroenterology

## 2015-04-27 ENCOUNTER — Inpatient Hospital Stay (HOSPITAL_COMMUNITY): Payer: Managed Care, Other (non HMO)

## 2015-04-27 DIAGNOSIS — K210 Gastro-esophageal reflux disease with esophagitis: Secondary | ICD-10-CM

## 2015-04-27 DIAGNOSIS — B951 Streptococcus, group B, as the cause of diseases classified elsewhere: Secondary | ICD-10-CM

## 2015-04-27 DIAGNOSIS — R188 Other ascites: Secondary | ICD-10-CM

## 2015-04-27 DIAGNOSIS — K7200 Acute and subacute hepatic failure without coma: Principal | ICD-10-CM

## 2015-04-27 DIAGNOSIS — K219 Gastro-esophageal reflux disease without esophagitis: Secondary | ICD-10-CM

## 2015-04-27 LAB — MRSA PCR SCREENING: MRSA by PCR: NEGATIVE

## 2015-04-27 LAB — GRAM STAIN

## 2015-04-27 LAB — COMPREHENSIVE METABOLIC PANEL
ALT: 25 U/L (ref 17–63)
AST: 32 U/L (ref 15–41)
Albumin: 3.3 g/dL — ABNORMAL LOW (ref 3.5–5.0)
Alkaline Phosphatase: 63 U/L (ref 38–126)
Anion gap: 7 (ref 5–15)
BILIRUBIN TOTAL: 1.9 mg/dL — AB (ref 0.3–1.2)
BUN: 20 mg/dL (ref 6–20)
CALCIUM: 9.5 mg/dL (ref 8.9–10.3)
CO2: 25 mmol/L (ref 22–32)
CREATININE: 1.07 mg/dL (ref 0.61–1.24)
Chloride: 101 mmol/L (ref 101–111)
GFR calc Af Amer: >60 mL/min (ref >60)
Glucose, Bld: 83 mg/dL (ref 65–99)
POTASSIUM: 4.3 mmol/L (ref 3.5–5.1)
Sodium: 133 mmol/L — ABNORMAL LOW (ref 135–145)
TOTAL PROTEIN: 6.7 g/dL (ref 6.5–8.1)

## 2015-04-27 LAB — GLUCOSE, CAPILLARY
Glucose-Capillary: 76 mg/dL (ref 65–99)
Glucose-Capillary: 127 mg/dL — ABNORMAL HIGH (ref 65–99)
Glucose-Capillary: 225 mg/dL — ABNORMAL HIGH (ref 65–99)
Glucose-Capillary: 185 mg/dL — ABNORMAL HIGH (ref 65–99)

## 2015-04-27 LAB — BODY FLUID CELL COUNT WITH DIFFERENTIAL
LYMPHS FL: 24 %
Monocyte-Macrophage-Serous Fluid: 74 % (ref 50–90)
NEUTROPHIL FLUID: 2 % (ref 0–25)
Total Nucleated Cell Count, Fluid: 160 cu mm (ref 0–1000)

## 2015-04-27 LAB — CBC
HEMATOCRIT: 30.4 % — AB (ref 39.0–52.0)
Hemoglobin: 10.6 g/dL — ABNORMAL LOW (ref 13.0–17.0)
MCH: 31.2 pg (ref 26.0–34.0)
MCHC: 34.9 g/dL (ref 30.0–36.0)
MCV: 89.4 fL (ref 78.0–100.0)
PLATELETS: 38 10*3/uL — AB (ref 150–400)
RBC: 3.4 MIL/uL — ABNORMAL LOW (ref 4.22–5.81)
RDW: 15.7 % — AB (ref 11.5–15.5)
WBC: 4.2 10*3/uL (ref 4.0–10.5)

## 2015-04-27 LAB — ALBUMIN, FLUID (OTHER)

## 2015-04-27 LAB — MAGNESIUM: Magnesium: 1.9 mg/dL (ref 1.7–2.4)

## 2015-04-27 LAB — HEMOGLOBIN A1C
Hgb A1c MFr Bld: 6.4 % — ABNORMAL HIGH (ref 4.8–5.6)
MEAN PLASMA GLUCOSE: 137 mg/dL

## 2015-04-27 LAB — PROTEIN, BODY FLUID

## 2015-04-27 LAB — GLUCOSE, SEROUS FLUID: GLUCOSE FL: 108 mg/dL

## 2015-04-27 LAB — LACTATE DEHYDROGENASE, PLEURAL OR PERITONEAL FLUID: LD, Fluid: 46 U/L — ABNORMAL HIGH (ref 3–23)

## 2015-04-27 LAB — PROTIME-INR
INR: 1.5 — AB (ref 0.00–1.49)
PROTHROMBIN TIME: 18.2 s — AB (ref 11.6–15.2)

## 2015-04-27 LAB — APTT: APTT: 36 s (ref 24–37)

## 2015-04-27 LAB — AMMONIA: Ammonia: 45 umol/L — ABNORMAL HIGH (ref 9–35)

## 2015-04-27 MED ORDER — ALBUMIN HUMAN 25 % IV SOLN
INTRAVENOUS | Status: AC
  Filled 2015-04-27: qty 100

## 2015-04-27 MED ORDER — ALBUMIN HUMAN 25 % IV SOLN
50.0000 g | Freq: Once | INTRAVENOUS | Status: AC
  Administered 2015-04-27: 50 g via INTRAVENOUS
  Filled 2015-04-27: qty 50

## 2015-04-27 MED ORDER — ALBUMIN HUMAN 25 % IV SOLN
INTRAVENOUS | Status: AC
  Filled 2015-04-27: qty 50

## 2015-04-27 NOTE — Progress Notes (Signed)
[facility] PROGRESS NOTE  [name] BWG:[id number] DOB: April 17, 1951 DOA: 04/26/2015 PCP: [name], MD  Assessment/Plan: #1 acute hepatic encephalopathy/acute encephalopathy Patient with some clinical improvement and less drowsy today. Likely secondary to noncompliance. CT head negative. Chest x-ray negative for any acute infiltrates. Patient is afebrile. White count of 4.2. Blood cultures pending. Urinalysis is nitrite negative leukocytes negative. Ammonia level at 45 from 191 on admission. Continue lactulose and Xifaxan. Continue empiric IV Rocephin while cultures pending from diagnostic paracentesis which was done in the ED. GI following.  #2 cirrhosis with abdominal ascites Patient with abdominal ascites with some complaints of lower abdominal pain. Patient is afebrile with a normal white count. Patient had a diagnostic paracentesis done in the ED with 120 mL removed however patient still with ascites noted on examination. Patient has been pancultured. Continue empiric IV Rocephin for now while cultures of paracentesis are pending. Patient for diagnostic and therapeutic paracentesis this morning. We'll give a dose of albumin. Patient on Lasix and Aldactone however blood pressure is borderline and will have to monitor closely.  #3 borderline blood pressure Likely secondary to cirrhotic state. Patient on Lasix and Aldactone. Patient has been pancultured. Patient currently asymptomatic. Monitor closely with diuretics. May need to hold diuretics if blood pressure remains borderline.  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 Patient afebrile. Patient with no complaints of neck pain. Patient was chronically on amoxicillin. Patient currently on IV Rocephin and a such will hold amoxicillin for now will resume once Rocephin has been discontinued. Outpatient follow-up with ID and neurosurgery.  #5 chronic thrombocytopenia Stable. No  overt bleeding. Follow.  #6 pancytopenia Likely secondary to prominent #2. Follow.  #7 diabetes mellitus type 2 Hemoglobin A1c 6.4. CBGs have ranged from 76 -164. Discontinue long acting insulin and will need to verify actual dose. Continue sliding scale insulin. Advance diet.  #8 gastroesophageal reflux disease PPI.  #9 prophylaxis PPI for GI prophylaxis. SCDs for DVT prophylaxis.  Code Status: DO NOT RESUSCITATE Family Communication: Updated patient. No family at bedside. Disposition Plan: Remain in stepdown.   Consultants:  Gastroenterology: [name] 04/26/2015  Procedures:  CT head 04/26/2015  Chest x-ray 04/26/2015  Diagnostic paracentesis. ED physician Dr. Dayna Barker 04/26/2015 with 120 mL removed  Antibiotics:  IV Rocephin 04/26/2015  HPI/Subjective: Patient sleeping however easily arousable. Patient alert and oriented to self place and time. Patient denies any shortness of breath. No chest pain. Patient states it fluid is drawn off his abdomen feels abdominal pain will improve.  Objective: Filed Vitals:   04/27/15 0400  BP: 111/64  Pulse:   Temp: 98.9 F (37.2 C)  Resp:    No intake or output data in the 24 hours ending 04/27/15 0848 Filed Weights   04/26/15 1858 04/27/15 0500  Weight: 78.5 kg (173 lb 1 oz) 77.5 kg (170 lb 13.7 oz)    Exam:   General:  NAD  Cardiovascular: RRR  Respiratory: CTAB  Abdomen: Distended, shifting dullness, some tenderness to deep palpation in the lower abdomen, positive bowel sounds  Musculoskeletal: No clubbing cyanosis or edema.   Data Reviewed: Basic Metabolic Panel:  Recent Labs Lab 04/26/15 1140 04/26/15 1214 04/27/15 0415  NA 133* 132* 133*  K 4.4 4.4 4.3  CL 101 100* 101  CO2 26 26 25   GLUCOSE 236* 237* 83  BUN 22* 21* 20  CREATININE 1.25* 1.28* 1.07  CALCIUM 9.3 9.2 9.5  MG  --   --  1.9  Liver Function Tests:  Recent Labs Lab 04/26/15 1214 04/27/15 0415  AST 34 32  ALT 26 25   ALKPHOS 73 63  BILITOT 1.4* 1.9*  PROT 7.1 6.7  ALBUMIN 3.4* 3.3*    Recent Labs Lab 04/26/15 1140  LIPASE 52*    Recent Labs Lab 04/26/15 1214 04/27/15 0415  AMMONIA 191* 45*   CBC:  Recent Labs Lab 04/26/15 1140 04/27/15 0415  WBC 3.5* 4.2  HGB 11.4* 10.6*  HCT 32.8* 30.4*  MCV 88.6 89.4  PLT 34* 38*   Cardiac Enzymes: No results for input(s): CKTOTAL, CKMB, CKMBINDEX, TROPONINI in the last 168 hours. BNP (last 3 results) No results for input(s): BNP in the last 8760 hours.  ProBNP (last 3 results) No results for input(s): PROBNP in the last 8760 hours.  CBG:  Recent Labs Lab 04/26/15 2209 04/27/15 0743  GLUCAP 164* 76    Recent Results (from the past 240 hour(s))  Body fluid culture     Status: None (Preliminary result)   Collection Time: 04/26/15  3:39 PM  Result Value Ref Range Status   Specimen Description PERITONEAL CAVITY  Final   Special Requests Normal  Final   Gram Stain   Final    FEW WBC PRESENT, PREDOMINANTLY MONONUCLEAR NO ORGANISMS SEEN Performed at [facility]    Culture PENDING  Incomplete   Report Status PENDING  Incomplete  MRSA PCR Screening     Status: None   Collection Time: 04/26/15  6:44 PM  Result Value Ref Range Status   MRSA by PCR NEGATIVE NEGATIVE Final    Comment:        The GeneXpert MRSA Assay (FDA approved for NASAL specimens only), is one component of a comprehensive MRSA colonization surveillance program. It is not intended to diagnose MRSA infection nor to guide or monitor treatment for MRSA infections.      Studies: Ct Head Wo Contrast  04/26/2015  CLINICAL DATA:  Altered mental status. Lethargic, off balance today. EXAM: CT HEAD WITHOUT CONTRAST TECHNIQUE: Contiguous axial images were obtained from the base of the skull through the vertex without intravenous contrast. COMPARISON:  01/30/2015. FINDINGS: No evidence for acute infarction, hemorrhage, mass lesion, hydrocephalus, or extra-axial  fluid. Slight premature atrophy. Hypoattenuation of white matter suggesting small vessel disease. No large vessel occlusion. Calvarium. Intraorbital calcification or metallic structure near the RIGHT nasolacrimal duct, could represent postsurgical change or calculi, stable from prior. Otherwise negative orbits, sinuses, and mastoids. IMPRESSION: No acute intracranial findings. Electronically Signed   By: Staci Righter M.D.   On: 04/26/2015 17:22   Dg Chest Portable 1 View  04/26/2015  CLINICAL DATA:  Altered mental status.  Lethargy. EXAM: PORTABLE CHEST 1 VIEW COMPARISON:  01/30/2015 FINDINGS: Very low lung volumes are seen with bibasilar atelectasis. No evidence of pulmonary consolidation or pleural effusion. Heart size is within normal limits allowing for low lung volumes. IMPRESSION: Low lung volumes and mild bibasilar atelectasis. Electronically Signed   By: Earle Gell M.D.   On: 04/26/2015 13:22    Scheduled Meds: . baclofen  5-10 mg Oral QHS  . bethanechol  50 mg Oral BID  . cefTRIAXone (ROCEPHIN)  IV  2 g Intravenous Q24H  . FLUoxetine  10 mg Oral Daily  . furosemide  40 mg Oral Daily  . insulin aspart  0-9 Units Subcutaneous TID WC  . lactulose  30 g Oral TID  . multivitamin with minerals  1 tablet Oral Daily  . pantoprazole  40  mg Oral Daily  . rifaximin  550 mg Oral BID  . sodium chloride  3 mL Intravenous Q12H  . sodium chloride  3 mL Intravenous Q12H  . spironolactone  100 mg Oral Daily  . tamsulosin  0.4 mg Oral Daily   Continuous Infusions:   Principal Problem:   Acute encephalopathy Active Problems:   Acute hepatic encephalopathy (HCC)   Pancytopenia (HCC)   Essential hypertension   Esophageal varices (HCC)   ESOPHAGITIS, REFLUX   GERD   Constipation   Hepatic cirrhosis (HCC)   Portal hypertension (HCC)   Abdominal pain, generalized   ASCITES   IRON DEFICIENCY ANEMIA, HX OF   cervical Epidural abscess   Encephalopathy acute   Streptococcal bacteremia   NASH  (nonalcoholic steatohepatitis)   Diabetes mellitus type 2, uncontrolled (HCC)   Thrombocytopenia (HCC)   Group B streptococcal infection   Ascites   Hyperammonemia ([place])   Altered mental status    Time spent: [place] MD [facility] Pager [phone]. If 7PM-7AM, please contact night-coverage at [email or web address], password [facility] 04/27/2015, 8:48 AM  LOS: 1 day

## 2015-04-27 NOTE — ED Provider Notes (Signed)
Medical screening examination/treatment/procedure(s) were conducted as a shared visit with non-physician practitioner(s) and myself.  I personally evaluated the patient during the encounter.  H/o cirrhosis, here with worsening confusion and distension. On exam patient sleepy but oriented. No abdominal ttp but is distended with a fluid wave. Heart and lungs normal. No hypotension or fever.   ddx large: meds, hepatic encephalopathy, SBP, sepsis from other source, developing hepatorenal syndrome.   Will plan for labs and admission. Diagnostic paracentesis performed by myself as documented below:   Physical Exam   ED Course  .Paracentesis Date/Time: 04/27/2015 7:17 AM Performed by: Merrily Pew Authorized by: Merrily Pew Consent: Verbal consent obtained. Written consent obtained. Risks and benefits: risks, benefits and alternatives were discussed Consent given by: patient Patient understanding: patient states understanding of the procedure being performed Patient consent: the patient's understanding of the procedure matches consent given Site marked: the operative site was marked Required items: required blood products, implants, devices, and special equipment available Patient identity confirmed: verbally with patient and arm band Initial or subsequent exam: initial Procedure purpose: diagnostic Indications: suspected peritonitis Anesthesia: local infiltration Local anesthetic: lidocaine 1% with epinephrine Anesthetic total: 2 ml Preparation: Patient was prepped and draped in the usual sterile fashion. Needle gauge: 18 Ultrasound guidance: yes Puncture site: left lower quadrant Fluid removed: 120(ml) Fluid appearance: serosanguinous Dressing: dermabond. Patient tolerance: Patient tolerated the procedure well with no immediate complications     Merrily Pew, MD 04/27/15 [phone]

## 2015-04-27 NOTE — Progress Notes (Addendum)
Inpatient Diabetes Program Recommendations  AACE/ADA: New Consensus Statement on Inpatient Glycemic Control (2015)  Target Ranges:  Prepandial:   less than 140 mg/dL      Peak postprandial:   less than 180 mg/dL (1-2 hours)      Critically ill patients:  140 - 180 mg/dL    Results for NELLO, CORRO (MRN [id number]) as of 04/27/2015 09:30  Ref. Range 04/27/2015 07:43  Glucose-Capillary Latest Ref Range: 65-99 mg/dL 76     Admit with: Hepatic Encephalopathy  History: DM, Cirrhosis, NASH  Home DM Meds: Tyler Aas insulin: 48-60 units daily (patient normally takes 48 units daily; takes 60 units daily if AM CBG >160 mg/dl)       Dulaglutide 1.5 mg Qweek (Thursday)  Current Insulin Orders: Novolog Sensitive SSI (0-9 units) TID AC    -Note per records, patient last took home dose of Tresiba insulin on 10/20.  -Per manufacturer web-site, Tyler Aas insulin can have a duration of action up to 42 hours in patients.  Patient's last dose of Tresiba insulin is likely still on board as patient's AM CBG today: 76 mg/dl.  -Note patient currently has orders for Novolog Sensitive SSI.     MD- If patient's fasting glucose becomes elevated tomorrow AM (10/22), recommend starting Levemir 25 units daily in the AM (this would be ~50% of patient's home dose of basal insulin).  Can titrate upward as needed.    If patient's fasting glucose tomorrow AM (10/22) is WNL, do not recommend starting basal insulin yet.     --Will follow patient during hospitalization--  Wyn Quaker RN, MSN, CDE Diabetes Coordinator Inpatient Glycemic Control Team Team Pager: [phone] (8a-5p)

## 2015-04-27 NOTE — Care Management Note (Signed)
Case Management Note  Patient Details  Name: [name] MRN: [id number] Date of Birth: October 06, 1950  Subjective/Objective:              Gross confusion and encephaleopthy      Action/Plan: Will follow for needs/chart reviewed  Expected Discharge Date:   (unknown)               Expected Discharge Plan:  Home/Self Care  In-House Referral:  NA  Discharge planning Services  CM Consult  Post Acute Care Choice:  NA Choice offered to:  NA  DME Arranged:    DME Agency:     HH Arranged:    [place] Agency:     Status of Service:  In process, will continue to follow  Medicare Important Message Given:    Date Medicare IM Given:    Medicare IM give by:    Date Additional Medicare IM Given:    Additional Medicare Important Message give by:     If discussed at [place] of Stay Meetings, dates discussed:    Additional Comments:  [name], RN 04/27/2015, 11:11 AM

## 2015-04-27 NOTE — Procedures (Signed)
Successful US guided paracentesis from RLQ.  Yielded 5.2 liters of serous fluid.  No immediate complications.  Pt tolerated well.   Specimen was sent for labs.  Tsosie Billing D PA-C 04/27/2015 10:15 AM

## 2015-04-27 NOTE — Progress Notes (Signed)
Patient ID: [name], male   DOB: Jun 12, 1951, 64 y.o.   MRN: [id number]    Progress Note   Subjective   Pt in [place] for diagnostic and therapeutic  paracentesis- Hospitalist extensive note reviewed- pt improved today Ammonia 45  Paracentesis yesterday in ER 120 cc removed- sent for culture- no cell counts- On [place]   Objective   Vital signs in last 24 hours: Temp:  [97.3 F (36.3 C)-98.9 F (37.2 C)] 98.9 F (37.2 C) (10/21 0400) Pulse Rate:  [69-91] 91 (10/21 0812) Resp:  [12-14] 14 (10/21 0812) BP: (88-134)/(47-92) 88/47 mmHg (10/21 0812) SpO2:  [95 %-100 %] 95 % (10/21 0812) Weight:  [170 lb 13.7 oz (77.5 kg)-173 lb 1 oz (78.5 kg)] 170 lb 13.7 oz (77.5 kg) (10/21 0500) Last BM Date: 04/26/15    Will examine later General:     Heart:  Regular rate and rhythm; no murmurs Lungs: Respirations even and unlabored, lungs CTA bilaterally Abdomen:  Soft, nontender and nondistended. Normal bowel sounds. Extremities:  Without edema. Neurologic:  Alert and oriented,  grossly normal neurologically. Psych:  Cooperative. Normal mood and affect.  Intake/Output from previous day:   Intake/Output this shift:    Lab Results:  Recent Labs  04/26/15 1140 04/27/15 0415  WBC 3.5* 4.2  HGB 11.4* 10.6*  HCT 32.8* 30.4*  PLT 34* 38*   BMET  Recent Labs  04/26/15 1140 04/26/15 1214 04/27/15 0415  NA 133* 132* 133*  K 4.4 4.4 4.3  CL 101 100* 101  CO2 26 26 25   GLUCOSE 236* 237* 83  BUN 22* 21* 20  CREATININE 1.25* 1.28* 1.07  CALCIUM 9.3 9.2 9.5   LFT  Recent Labs  04/27/15 0415  PROT 6.7  ALBUMIN 3.3*  AST 32  ALT 25  ALKPHOS 63  BILITOT 1.9*   PT/INR  Recent Labs  04/26/15 1432 04/27/15 0415  LABPROT 17.1* 18.2*  INR 1.38 1.50*    Studies/Results: Ct Head Wo Contrast  04/26/2015  CLINICAL DATA:  Altered mental status. Lethargic, off balance today. EXAM: CT HEAD WITHOUT CONTRAST TECHNIQUE: Contiguous axial images were obtained from the base of the  skull through the vertex without intravenous contrast. COMPARISON:  01/30/2015. FINDINGS: No evidence for acute infarction, hemorrhage, mass lesion, hydrocephalus, or extra-axial fluid. Slight premature atrophy. Hypoattenuation of white matter suggesting small vessel disease. No large vessel occlusion. Calvarium. Intraorbital calcification or metallic structure near the RIGHT nasolacrimal duct, could represent postsurgical change or calculi, stable from prior. Otherwise negative orbits, sinuses, and mastoids. IMPRESSION: No acute intracranial findings. Electronically Signed   By: Staci Righter M.D.   On: 04/26/2015 17:22   Dg Chest Portable 1 View  04/26/2015  CLINICAL DATA:  Altered mental status.  Lethargy. EXAM: PORTABLE CHEST 1 VIEW COMPARISON:  01/30/2015 FINDINGS: Very low lung volumes are seen with bibasilar atelectasis. No evidence of pulmonary consolidation or pleural effusion. Heart size is within normal limits allowing for low lung volumes. IMPRESSION: Low lung volumes and mild bibasilar atelectasis. Electronically Signed   By: Earle Gell M.D.   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/2014 #4 AODM  Principal  Problem:   Acute encephalopathy Active Problems:   Pancytopenia (HCC)   Essential hypertension   Esophageal varices (HCC)   ESOPHAGITIS, REFLUX   GERD   Constipation   Hepatic cirrhosis (HCC)   Portal hypertension (HCC)   Abdominal pain, generalized   ASCITES   IRON DEFICIENCY ANEMIA, HX OF   cervical Epidural abscess   Encephalopathy acute   Streptococcal bacteremia   NASH (nonalcoholic  steatohepatitis)   Diabetes mellitus type 2, uncontrolled (HCC)   Thrombocytopenia (HCC)   Group B streptococcal infection   Ascites   Hyperammonemia (HCC)   Acute hepatic encephalopathy (HCC)   Altered mental status     LOS: 1 day   [name]  04/27/2015, 10:03 AM  GI ATTENDING  Interval history data reviewed. Patient personally seen and examined. Wife in room. He looks much better today. Alert with no evidence for encephalopathy. No additional precipitating factors (other than education noncompliance) identified. He underwent large volume paracentesis today. Cell count with differential pending. At this point he could be moved to a regular floor. I would anticipate that he could be discharged home tomorrow resuming his previous medications. I stressed the importance of titrating lactulose to achieve 3-5 bowel movements per day. They appear to understand.  Docia Chuck. Geri Seminole., M.D. [facility] Division of Gastroenterology

## 2015-04-28 ENCOUNTER — Other Ambulatory Visit: Payer: Self-pay | Admitting: Internal Medicine

## 2015-04-28 DIAGNOSIS — R1084 Generalized abdominal pain: Secondary | ICD-10-CM

## 2015-04-28 LAB — URINE CULTURE: Culture: NO GROWTH

## 2015-04-28 LAB — COMPREHENSIVE METABOLIC PANEL
ALT: 23 U/L (ref 17–63)
AST: 28 U/L (ref 15–41)
Albumin: 3.5 g/dL (ref 3.5–5.0)
Alkaline Phosphatase: 56 U/L (ref 38–126)
Anion gap: 9 (ref 5–15)
BUN: 20 mg/dL (ref 6–20)
CO2: 22 mmol/L (ref 22–32)
Calcium: 9.2 mg/dL (ref 8.9–10.3)
Chloride: 101 mmol/L (ref 101–111)
Creatinine, Ser: 1.09 mg/dL (ref 0.61–1.24)
GFR calc Af Amer: >60 mL/min (ref >60)
GFR calc non Af Amer: >60 mL/min (ref >60)
Glucose, Bld: 112 mg/dL — ABNORMAL HIGH (ref 65–99)
Potassium: 4 mmol/L (ref 3.5–5.1)
Sodium: 132 mmol/L — ABNORMAL LOW (ref 135–145)
Total Bilirubin: 1.4 mg/dL — ABNORMAL HIGH (ref 0.3–1.2)
Total Protein: 6.6 g/dL (ref 6.5–8.1)

## 2015-04-28 LAB — CBC
HCT: 27.9 % — ABNORMAL LOW (ref 39.0–52.0)
Hemoglobin: 9.8 g/dL — ABNORMAL LOW (ref 13.0–17.0)
MCH: 31.1 pg (ref 26.0–34.0)
MCHC: 35.1 g/dL (ref 30.0–36.0)
MCV: 88.6 fL (ref 78.0–100.0)
Platelets: 29 10*3/uL — CL (ref 150–400)
RBC: 3.15 MIL/uL — ABNORMAL LOW (ref 4.22–5.81)
RDW: 15.4 % (ref 11.5–15.5)
WBC: 2.9 10*3/uL — ABNORMAL LOW (ref 4.0–10.5)

## 2015-04-28 LAB — GLUCOSE, CAPILLARY
Glucose-Capillary: 94 mg/dL (ref 65–99)
Glucose-Capillary: 151 mg/dL — ABNORMAL HIGH (ref 65–99)

## 2015-04-28 LAB — AMMONIA: Ammonia: 87 umol/L — ABNORMAL HIGH (ref 9–35)

## 2015-04-28 MED ORDER — LACTULOSE 10 GM/15ML PO SOLN: 30.0000 g | Freq: Three times a day (TID) | ORAL | Status: DC

## 2015-04-28 MED ORDER — SPIRONOLACTONE 100 MG PO TABS: 100.0000 mg | ORAL_TABLET | Freq: Every day | ORAL | Status: DC

## 2015-04-28 NOTE — Discharge Summary (Signed)
Physician Discharge Summary  [name] IEP:[id number] DOB: [birth year] DOA: 04/26/2015  PCP: [name], MD  Admit date: 04/26/2015 Discharge date: 04/28/2015  Time spent: 65 minutes  Recommendations for Outpatient Follow-up:  1. Follow-up with [name] of gastroenterology in 2 weeks.  Discharge Diagnoses:  Principal Problem:   Acute encephalopathy Active Problems:   Acute hepatic encephalopathy (HCC)   Pancytopenia (HCC)   Essential hypertension   Esophageal varices (HCC)   ESOPHAGITIS, REFLUX   GERD   Constipation   Hepatic cirrhosis (HCC)   Portal hypertension (HCC)   Abdominal pain, generalized   ASCITES   IRON DEFICIENCY ANEMIA, HX OF   cervical Epidural abscess   Encephalopathy acute   Streptococcal bacteremia   NASH (nonalcoholic steatohepatitis)   Diabetes mellitus type 2, uncontrolled (HCC)   Thrombocytopenia (HCC)   Group B streptococcal infection   Ascites   Hyperammonemia (HCC)   Altered mental status   Alcoholic cirrhosis of liver with ascites ([facility])   Discharge Condition: Stable and improved  Diet recommendation: Heart healthy  Filed Weights   04/26/15 1858 04/27/15 0500 04/28/15 0400  Weight: 78.5 kg (173 lb 1 oz) 77.5 kg (170 lb 13.7 oz) 76.8 kg (169 lb 5 oz)    History of present illness:  [name] is a 64 y.o. 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 Patient had not taken his  Xifaxan or lactulose in a few days. Patient was also noted to be lethargic. Patient was subsequently sent to the ED to be admitted for further evaluation and management. Patient was sleepy, however easily arousable but falls back to sleep however answers questions appropriately with wife at bedside. Patient stated that he's had increasing abdominal distention over the past 10 days. Patient endorsed confusion for the past 1 day with some associated nausea, emesis, diffuse abdominal pain, constipation. Patient denied any fevers, no chills, no cough, no chest pain, no shortness of breath, no dysuria, no melena, no hematemesis, no hematochezia, no wheezing. Patient did endorse a generalized weakness and lethargy. Patient was seen in the emergency room ammonia level drawn was 191. Comprehensive metabolic profile had a sodium of 132 chloride of 100 BUN of 21 creatinine of 1.28 glucose of 237 total bilirubin of 1.4 otherwise was within normal limits. Lactic acid level was 2.04. CBC had a white count of 3.5 hemoglobin of 11.4 and a platelet count of 34. INR was 1.38. PT was 17.1. Chest x-ray was negative for any acute infiltrate. Patient underwent a diagnostic paracentesis with 40 mL removed. EKG shows some T-wave inversions in leads 3 and aVF which is old, right axis deviation, prolonged PR interval of 243. [facility] hospital[facility] were called to admit the patient for further evaluation and management  Hospital Course:  #1 acute hepatic encephalopathy/acute encephalopathy Patient was admitted with acute encephalopathy felt to be secondary to hepatic encephalopathy. Ammonia levels on admission were elevated 191. Patient's acute hepatic encephalopathy was secondary to medical noncompliance and patient hasn't been taking his lactulose and Xifaxan as  prescribed. CT head negative. Chest x-ray negative for any acute infiltrates. Patient remained afebrile. White count of 4.2. Blood cultures pending. Urinalysis is nitrite negative  leukocytes negative. Ammonia level trended down. Patient was placed on lactulose 30 g 3 times daily as well as Xifaxan. Patient was also placed empirically on IV Rocephin while awaiting paracentesis Gram stain and culture results. Patient improved clinically and was back to baseline by day of discharge. Culture results from paracentesis were negative and a such IV Rocephin will be discontinued on discharge. Patient was seen by gastroenterology throughout the hospitalization and will follow-up with them as outpatient. Patient be discharged on lactulose 30 g 3 times daily to be titrated as needed for 3-5 bowel movements per day as well as Xifaxan.  #2 cirrhosis with abdominal ascites Patient with abdominal ascites on admission, with some complaints of lower abdominal pain. Patient is afebrile with a normal white count. Patient had a diagnostic paracentesis done in the ED with 120 mL removed however patient still with ascites noted on examination on admission. Patient was pancultured. Patient underwent diagnostic and therapeutic paracentesis by interventional radiology 04/27/2015 with 5.2 L of serous fluid removed. Gram stain and cultures negative to date. Patient was placed empirically on IV Rocephin however cultures were negative from paracentesis and distal discontinued on discharge. Due to patient's borderline blood pressure patient's Lasix and Aldactone were held during the hospitalization. Patient will be resumed back on home dose Lasix and Aldactone dose to be decreased to 100 mg daily due to borderline blood pressure. Patient will be continued on Xifaxan. Patient will follow-up with GI as outpatient.   #3 borderline blood pressure Likely secondary to cirrhotic state. Patient on Lasix and Aldactone. Patient has been pancultured. Patient currently asymptomatic. Lasix and Aldactone were discontinued secondary to borderline blood pressure.   #4 history of group B streptococcal infection/epidural abscess  April 2016 Currently stable. Patient afebrile. Patient with no complaints of neck pain. Patient was chronically on amoxicillin. Patient was placed empirically on IV Rocephin on admission due to concerns for possible SBP and amoxicillin held. Cultures from paracenteses were negative and a such IV Rocephin will be discontinued on discharge and patient be resumed back home regimen of amoxicillin. Outpatient follow-up with ID and neurosurgery.  #5 chronic thrombocytopenia Stable. No overt bleeding. Follow.  #6 pancytopenia Likely secondary to #2. No overt bleeding. Follow.  #7 diabetes mellitus type 2 Hemoglobin A1c 6.4. Patient was maintained on a sliding scale during the hospitalization.  #8 gastroesophageal reflux disease PPI.   Procedures:  CT head 04/26/2015  Chest x-ray 04/26/2015  Diagnostic paracentesis. ED physician Dr. Dayna Barker 04/26/2015 with 120 mL removed  Diagnostic and therapeutic paracenteses per Tsosie Billing, PA interventional radiology with 5.2 L of serous fluid removed 04/27/2015    Consultations:  Gastroenterology: [name] 04/26/2015  Discharge Exam: Filed Vitals:   04/28/15 0800  BP: 91/52  Pulse: 78  Temp: 98.8 F (37.1 C)  Resp: 13    General: NAD Cardiovascular: RRR Respiratory: CTAB  Discharge Instructions   Discharge Instructions    Diet - low sodium heart healthy    Complete by:  As directed      Discharge instructions    Complete by:  As directed   Follow up with Dr [name] in 2 weeks.     Increase activity slowly    Complete by:  As directed           Current Discharge Medication List    CONTINUE these medications  which have CHANGED   Details  lactulose (CHRONULAC) 10 GM/15ML solution Take 45 mLs (30 g total) by mouth 3 (three) times daily. Qty: 1892 mL, Refills: 3    spironolactone (ALDACTONE) 100 MG tablet Take 1 tablet (100 mg total) by mouth daily. Qty: 180 tablet, Refills: 0      CONTINUE these medications which have  NOT CHANGED   Details  amoxicillin (AMOXIL) 500 MG capsule Take 1 capsule (500 mg total) by mouth 3 (three) times daily. Qty: 90 capsule, Refills: 11   Associated Diagnoses: Streptococcal bacteremia    baclofen (LIORESAL) 10 MG tablet Take 0.5-1 tablets (5-10 mg total) by mouth at bedtime. Qty: 30 tablet, Refills: 2    bethanechol (URECHOLINE) 50 MG tablet Take 1 tablet by mouth 2 (two) times daily.     Dulaglutide 1.5 MG/0.5ML SOPN Inject 1.5 mg into the skin once a week. On thursdays    FLUoxetine (PROZAC) 10 MG tablet Take 10 mg by mouth daily.    furosemide (LASIX) 20 MG tablet TAKE 2 TABLETS EVERY DAY Qty: 60 tablet, Refills: 3    gabapentin (NEURONTIN) 100 MG capsule Take 300 mg by mouth 3 (three) times daily as needed (May increase to 4 capsules every 8 hours as needed).     Insulin Degludec (TRESIBA FLEXTOUCH) 200 UNIT/ML SOPN Inject 48-60 Units into the skin daily.     Multiple Vitamin (MULTIVITAMIN WITH MINERALS) TABS tablet Take 1 tablet by mouth daily.    pantoprazole (PROTONIX) 40 MG tablet TAKE 1 TABLET (40 MG TOTAL) BY MOUTH DAILY. Qty: 30 tablet, Refills: 5    tamsulosin (FLOMAX) 0.4 MG CAPS capsule Take 1 capsule by mouth daily. Take once daily by mouth 30 minutes after supper    Vitamin D, Ergocalciferol, (DRISDOL) 50000 UNITS CAPS capsule Take 50,000 Units by mouth every 7 (seven) days. Refills: 11    XIFAXAN 550 MG TABS tablet TAKE 1 TABLET (550 MG TOTAL) BY MOUTH 2 (TWO) TIMES DAILY. Qty: 60 tablet, Refills: 0       No Known Allergies Follow-up Information    Follow up with Scarlette Shorts, MD. Schedule an appointment as soon as possible for a visit in 2 weeks.   Specialty:  Gastroenterology   Why:  Patient will be called with appointment time.   Contact information:   [address] [place] [address] [phone]        The results of significant diagnostics from this hospitalization (including imaging, microbiology, ancillary and laboratory)  are listed below for reference.    Significant Diagnostic Studies: Ct Head Wo Contrast  04/26/2015  CLINICAL DATA:  Altered mental status. Lethargic, off balance today. EXAM: CT HEAD WITHOUT CONTRAST TECHNIQUE: Contiguous axial images were obtained from the base of the skull through the vertex without intravenous contrast. COMPARISON:  01/30/2015. FINDINGS: No evidence for acute infarction, hemorrhage, mass lesion, hydrocephalus, or extra-axial fluid. Slight premature atrophy. Hypoattenuation of white matter suggesting small vessel disease. No large vessel occlusion. Calvarium. Intraorbital calcification or metallic structure near the RIGHT nasolacrimal duct, could represent postsurgical change or calculi, stable from prior. Otherwise negative orbits, sinuses, and mastoids. IMPRESSION: No acute intracranial findings. Electronically Signed   By: Staci Righter M.D.   On: 04/26/2015 17:22   US Paracentesis  04/27/2015  INDICATION: Cirrhosis, recurrent ascites and request for diagnostic and therapeutic paracentesis. EXAM: ULTRASOUND-GUIDED PARACENTESIS COMPARISON:  Paracentesis 04/04/2015. MEDICATIONS: None. COMPLICATIONS: None immediate TECHNIQUE: Informed written consent was obtained from the patient after a discussion of the  risks, benefits and alternatives to treatment. A timeout was performed prior to the initiation of the procedure. Initial ultrasound scanning demonstrates a large amount of ascites within the right lower abdominal quadrant. The right lower abdomen was prepped and draped in the usual sterile fashion. 1% lidocaine was used for local anesthesia. Under direct ultrasound guidance, a 19 gauge, 7-cm, Yueh catheter was introduced. An ultrasound image was saved for documentation purposed. The paracentesis was performed. The catheter was removed and a dressing was applied. The patient tolerated the procedure well without immediate post procedural complication. FINDINGS: A total of approximately 5.2  liters of serous fluid was removed. Samples were sent to the laboratory as requested by the clinical team. IMPRESSION: Successful ultrasound-guided paracentesis yielding 5.2 liters of peritoneal fluid. Read By:  Tsosie Billing PA-C Electronically Signed   By: Sandi Mariscal M.D.   On: 04/27/2015 11:03   US Paracentesis  04/04/2015  CLINICAL DATA:  Abdominal distention.  Recurrent ascites. EXAM: ULTRASOUND GUIDED PARACENTESIS COMPARISON:  None. PROCEDURE: An ultrasound guided paracentesis was thoroughly discussed with the patient and questions answered. The benefits, risks, alternatives and complications were also discussed. The patient understands and wishes to proceed with the procedure. Written consent was obtained. Ultrasound was performed to localize and mark an adequate pocket of fluid in the left lower quadrant of the abdomen. The area was then prepped and draped in the normal sterile fashion. 1% Lidocaine was used for local anesthesia. Under ultrasound guidance a 19 gauge Yueh catheter was introduced. Paracentesis was performed. The catheter was removed and a dressing applied. COMPLICATIONS: None immediate FINDINGS: A total of approximately 8 L of clear yellow fluid was removed. A fluid sample was sent for laboratory analysis. IMPRESSION: Successful ultrasound guided paracentesis yielding 8 L of ascites. Read by: Ascencion Dike PA-C Electronically Signed   By: Markus Daft M.D.   On: 04/04/2015 12:22   Dg Chest Portable 1 View  04/26/2015  CLINICAL DATA:  Altered mental status.  Lethargy. EXAM: PORTABLE CHEST 1 VIEW COMPARISON:  01/30/2015 FINDINGS: Very low lung volumes are seen with bibasilar atelectasis. No evidence of pulmonary consolidation or pleural effusion. Heart size is within normal limits allowing for low lung volumes. IMPRESSION: Low lung volumes and mild bibasilar atelectasis. Electronically Signed   By: Earle Gell M.D.   On: 04/26/2015 13:22    Microbiology: Recent Results (from the past  240 hour(s))  Blood culture (routine x 2)     Status: None (Preliminary result)   Collection Time: 04/26/15 12:32 PM  Result Value Ref Range Status   Specimen Description BLOOD LEFT HAND  Final   Special Requests BOTTLES DRAWN AEROBIC AND ANAEROBIC 5CC  Final   Culture   Final    NO GROWTH 2 DAYS Performed at [facility]    Report Status PENDING  Incomplete  Blood culture (routine x 2)     Status: None (Preliminary result)   Collection Time: 04/26/15 12:45 PM  Result Value Ref Range Status   Specimen Description BLOOD RIGHT FOREARM  Final   Special Requests BOTTLES DRAWN AEROBIC AND ANAEROBIC 5CC  Final   Culture   Final    NO GROWTH 2 DAYS Performed at [facility]    Report Status PENDING  Incomplete  Body fluid culture     Status: None (Preliminary result)   Collection Time: 04/26/15  3:39 PM  Result Value Ref Range Status   Specimen Description PERITONEAL CAVITY  Final   Special Requests Normal  Final   Gram Stain   Final    FEW WBC PRESENT, PREDOMINANTLY MONONUCLEAR NO ORGANISMS SEEN    Culture   Final    NO GROWTH 2 DAYS Performed at [facility]    Report Status PENDING  Incomplete  MRSA PCR Screening     Status: None   Collection Time: 04/26/15  6:44 PM  Result Value Ref Range Status   MRSA by PCR NEGATIVE NEGATIVE Final    Comment:        The GeneXpert MRSA Assay (FDA approved for NASAL specimens only), is one component of a comprehensive MRSA colonization surveillance program. It is not intended to diagnose MRSA infection nor to guide or monitor treatment for MRSA infections.   Culture, body fluid-bottle     Status: None (Preliminary result)   Collection Time: 04/27/15 11:04 AM  Result Value Ref Range Status   Specimen Description FLUID PERITONEAL  Final   Special Requests NONE  Final   Culture   Final    NO GROWTH < 24 HOURS Performed at [facility]    Report Status PENDING  Incomplete  Gram stain     Status: None    Collection Time: 04/27/15 11:04 AM  Result Value Ref Range Status   Specimen Description FLUID PERITONEAL  Final   Special Requests NONE  Final   Gram Stain   Final    RARE WBC PRESENT, PREDOMINANTLY MONONUCLEAR NO ORGANISMS SEEN Performed at [facility]    Report Status 04/27/2015 FINAL  Final     Labs: Basic Metabolic Panel:  Recent Labs Lab 04/26/15 1140 04/26/15 1214 04/27/15 0415 04/28/15 0415  NA 133* 132* 133* 132*  K 4.4 4.4 4.3 4.0  CL 101 100* 101 101  CO2 26 26 25 22   GLUCOSE 236* 237* 83 112*  BUN 22* 21* 20 20  CREATININE 1.25* 1.28* 1.07 1.09  CALCIUM 9.3 9.2 9.5 9.2  MG  --   --  1.9  --    Liver Function Tests:  Recent Labs Lab 04/26/15 1214 04/27/15 0415 04/28/15 0415  AST 34 32 28  ALT 26 25 23   ALKPHOS 73 63 56  BILITOT 1.4* 1.9* 1.4*  PROT 7.1 6.7 6.6  ALBUMIN 3.4* 3.3* 3.5    Recent Labs Lab 04/26/15 1140  LIPASE 52*    Recent Labs Lab 04/26/15 1214 04/27/15 0415 04/28/15 0415  AMMONIA 191* 45* 87*   CBC:  Recent Labs Lab 04/26/15 1140 04/27/15 0415 04/28/15 0415  WBC 3.5* 4.2 2.9*  HGB 11.4* 10.6* 9.8*  HCT 32.8* 30.4* 27.9*  MCV 88.6 89.4 88.6  PLT 34* 38* 29*   Cardiac Enzymes: No results for input(s): CKTOTAL, CKMB, CKMBINDEX, TROPONINI in the last 168 hours. BNP: BNP (last 3 results) No results for input(s): BNP in the last 8760 hours.  ProBNP (last 3 results) No results for input(s): PROBNP in the last 8760 hours.  CBG:  Recent Labs Lab 04/27/15 0743 04/27/15 1245 04/27/15 1643 04/27/15 2120 04/28/15 0739  GLUCAP 76 127* 225* 185* 94       Signed:  Huckleberry Martinson MD [facility] 04/28/2015, 11:42 AM

## 2015-04-28 NOTE — Progress Notes (Signed)
CRITICAL VALUE ALERT  Critical value received:  Platelets 29  Date of notification:  04/28/15  Time of notification:  05:20  Critical value read back:Yes.    Nurse who received alert:  Osmara Drummonds D. Kymari Lollis  MD notified (1st page):  [facility]  Time of first page:  05:30  MD notified (2nd page): N/A  Time of second page: N/A  Responding MD:  Kathline Magic  Time MD responded:  05:32

## 2015-04-28 NOTE — Progress Notes (Signed)
[id number] PROGRESS NOTE  KEMPTON MILNE AYT:[id number] DOB: [birth year] DOA: 04/26/2015 PCP: [name], MD  Assessment/Plan: #1 acute hepatic encephalopathy/acute encephalopathy Patient alert and oriented 3. Clinical improvement. Likely secondary to noncompliance. CT head negative. Chest x-ray negative for any acute infiltrates. Patient is afebrile. White count of 4.2. Blood cultures pending. Urinalysis is nitrite negative leukocytes negative. Ammonia level at 87 from 191 on admission. Continue lactulose and Xifaxan. Continue empiric IV Rocephin while cultures pending from diagnostic paracentesis which was done in the ED. GI following.  #2 cirrhosis with abdominal ascites Patient with abdominal ascites on admission, with some complaints of lower abdominal pain. Patient is afebrile with a normal white count. Patient had a diagnostic paracentesis done in the ED with 120 mL removed however patient still with ascites noted on examination yesterday. Patient has been pancultured. Patient status post diagnostic and therapeutic paracentesis by interventional radiology 04/27/2015 with 5.2 L of serous fluid removed. Gram stain and cultures negative to date. Continue empiric IV Rocephin for now while cultures of paracentesis are pending. Due to borderline blood pressure Lasix and Aldactone have been discontinued.   #3 borderline blood pressure Likely secondary to cirrhotic state. Patient on Lasix and Aldactone. Patient has been pancultured. Patient currently asymptomatic. Lasix and Aldactone have been discontinued secondary to borderline blood pressure.   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 Patient afebrile. Patient with no complaints of neck pain. Patient was chronically on amoxicillin. Patient currently on IV Rocephin and as such will hold amoxicillin for now and will resume once Rocephin has been discontinued. Outpatient follow-up  with ID and neurosurgery.  #5 chronic thrombocytopenia Stable. No overt bleeding. Follow.  #6 pancytopenia Likely secondary to #2. No overt bleeding. Follow.  #7 diabetes mellitus type 2 Hemoglobin A1c 6.4. CBGs have ranged from 94 -185. Continue sliding scale insulin.   #8 gastroesophageal reflux disease PPI.  #9 prophylaxis PPI for GI prophylaxis. SCDs for DVT prophylaxis.  Code Status: DO NOT RESUSCITATE Family Communication: Updated patient. No family at bedside. Disposition Plan: Pending GI assessment possibly home today versus tomorrow.   Consultants:  Gastroenterology: [name] 04/26/2015  Procedures:  CT head 04/26/2015  Chest x-ray 04/26/2015  Diagnostic paracentesis. ED physician Dr. Dayna Barker 04/26/2015 with 120 mL removed  Diagnostic and therapeutic paracenteses per Tsosie Billing, PA interventional radiology with 5.2 L of serous fluid removed 04/27/2015  Antibiotics:  IV Rocephin 04/26/2015  HPI/Subjective: Patient sleeping however easily arousable. Patient alert and oriented to self, place and time. Patient denies any shortness of breath. No chest pain. No abdominal pain. Feeling better.  Objective: Filed Vitals:   04/28/15 0800  BP: 91/52  Pulse: 78  Temp: 98.8 F (37.1 C)  Resp: 13    Intake/Output Summary (Last 24 hours) at 04/28/15 0841 Last data filed at 04/28/15 0000  Gross per 24 hour  Intake    290 ml  Output      2 ml  Net    288 ml   Filed Weights   04/26/15 1858 04/27/15 0500 04/28/15 0400  Weight: 78.5 kg (173 lb 1 oz) 77.5 kg (170 lb 13.7 oz) 76.8 kg (169 lb 5 oz)    Exam:   General:  NAD  Cardiovascular: RRR  Respiratory: CTAB  Abdomen: Soft/NT/ND/+BS  Musculoskeletal: No clubbing cyanosis or edema.   Data Reviewed: Basic Metabolic Panel:  Recent Labs Lab 04/26/15 1140 04/26/15 1214 04/27/15 0415 04/28/15 0415  NA 133* 132* 133* 132*  K  4.4 4.4 4.3 4.0  CL 101 100* 101 101  CO2 26 26 25 22   GLUCOSE  236* 237* 83 112*  BUN 22* 21* 20 20  CREATININE 1.25* 1.28* 1.07 1.09  CALCIUM 9.3 9.2 9.5 9.2  MG  --   --  1.9  --    Liver Function Tests:  Recent Labs Lab 04/26/15 1214 04/27/15 0415 04/28/15 0415  AST 34 32 28  ALT 26 25 23   ALKPHOS 73 63 56  BILITOT 1.4* 1.9* 1.4*  PROT 7.1 6.7 6.6  ALBUMIN 3.4* 3.3* 3.5    Recent Labs Lab 04/26/15 1140  LIPASE 52*    Recent Labs Lab 04/26/15 1214 04/27/15 0415 04/28/15 0415  AMMONIA 191* 45* 87*   CBC:  Recent Labs Lab 04/26/15 1140 04/27/15 0415 04/28/15 0415  WBC 3.5* 4.2 2.9*  HGB 11.4* 10.6* 9.8*  HCT 32.8* 30.4* 27.9*  MCV 88.6 89.4 88.6  PLT 34* 38* 29*   Cardiac Enzymes: No results for input(s): CKTOTAL, CKMB, CKMBINDEX, TROPONINI in the last 168 hours. BNP (last 3 results) No results for input(s): BNP in the last 8760 hours.  ProBNP (last 3 results) No results for input(s): PROBNP in the last 8760 hours.  CBG:  Recent Labs Lab 04/27/15 0743 04/27/15 1245 04/27/15 1643 04/27/15 2120 04/28/15 0739  GLUCAP 76 127* 225* 185* 94    Recent Results (from the past 240 hour(s))  Body fluid culture     Status: None (Preliminary result)   Collection Time: 04/26/15  3:39 PM  Result Value Ref Range Status   Specimen Description PERITONEAL CAVITY  Final   Special Requests Normal  Final   Gram Stain   Final    FEW WBC PRESENT, PREDOMINANTLY MONONUCLEAR NO ORGANISMS SEEN    Culture   Final    NO GROWTH < 24 HOURS Performed at [facility]    Report Status PENDING  Incomplete  MRSA PCR Screening     Status: None   Collection Time: 04/26/15  6:44 PM  Result Value Ref Range Status   MRSA by PCR NEGATIVE NEGATIVE Final    Comment:        The GeneXpert MRSA Assay (FDA approved for NASAL specimens only), is one component of a comprehensive MRSA colonization surveillance program. It is not intended to diagnose MRSA infection nor to guide or monitor treatment for MRSA infections.   Gram  stain     Status: None   Collection Time: 04/27/15 11:04 AM  Result Value Ref Range Status   Specimen Description FLUID PERITONEAL  Final   Special Requests NONE  Final   Gram Stain   Final    RARE WBC PRESENT, PREDOMINANTLY MONONUCLEAR NO ORGANISMS SEEN Performed at [facility]    Report Status 04/27/2015 FINAL  Final     Studies: Ct Head Wo Contrast  04/26/2015  CLINICAL DATA:  Altered mental status. Lethargic, off balance today. EXAM: CT HEAD WITHOUT CONTRAST TECHNIQUE: Contiguous axial images were obtained from the base of the skull through the vertex without intravenous contrast. COMPARISON:  01/30/2015. FINDINGS: No evidence for acute infarction, hemorrhage, mass lesion, hydrocephalus, or extra-axial fluid. Slight premature atrophy. Hypoattenuation of white matter suggesting small vessel disease. No large vessel occlusion. Calvarium. Intraorbital calcification or metallic structure near the RIGHT nasolacrimal duct, could represent postsurgical change or calculi, stable from prior. Otherwise negative orbits, sinuses, and mastoids. IMPRESSION: No acute intracranial findings. Electronically Signed   By: Staci Righter M.D.   On:  04/26/2015 17:22   US Paracentesis  04/27/2015  INDICATION: Cirrhosis, recurrent ascites and request for diagnostic and therapeutic paracentesis. EXAM: ULTRASOUND-GUIDED PARACENTESIS COMPARISON:  Paracentesis 04/04/2015. MEDICATIONS: None. COMPLICATIONS: None immediate TECHNIQUE: Informed written consent was obtained from the patient after a discussion of the risks, benefits and alternatives to treatment. A timeout was performed prior to the initiation of the procedure. Initial ultrasound scanning demonstrates a large amount of ascites within the right lower abdominal quadrant. The right lower abdomen was prepped and draped in the usual sterile fashion. 1% lidocaine was used for local anesthesia. Under direct ultrasound guidance, a 19 gauge, 7-cm, Yueh catheter  was introduced. An ultrasound image was saved for documentation purposed. The paracentesis was performed. The catheter was removed and a dressing was applied. The patient tolerated the procedure well without immediate post procedural complication. FINDINGS: A total of approximately 5.2 liters of serous fluid was removed. Samples were sent to the laboratory as requested by the clinical team. IMPRESSION: Successful ultrasound-guided paracentesis yielding 5.2 liters of peritoneal fluid. Read By:  Tsosie Billing PA-C Electronically Signed   By: Sandi Mariscal M.D.   On: 04/27/2015 11:03   Dg Chest Portable 1 View  04/26/2015  CLINICAL DATA:  Altered mental status.  Lethargy. EXAM: PORTABLE CHEST 1 VIEW COMPARISON:  01/30/2015 FINDINGS: Very low lung volumes are seen with bibasilar atelectasis. No evidence of pulmonary consolidation or pleural effusion. Heart size is within normal limits allowing for low lung volumes. IMPRESSION: Low lung volumes and mild bibasilar atelectasis. Electronically Signed   By: Earle Gell M.D.   On: 04/26/2015 13:22    Scheduled Meds: . baclofen  5-10 mg Oral QHS  . bethanechol  50 mg Oral BID  . cefTRIAXone (ROCEPHIN)  IV  2 g Intravenous Q24H  . FLUoxetine  10 mg Oral Daily  . insulin aspart  0-9 Units Subcutaneous TID WC  . lactulose  30 g Oral TID  . multivitamin with minerals  1 tablet Oral Daily  . pantoprazole  40 mg Oral Daily  . rifaximin  550 mg Oral BID  . sodium chloride  3 mL Intravenous Q12H  . sodium chloride  3 mL Intravenous Q12H  . tamsulosin  0.4 mg Oral Daily   Continuous Infusions:   Principal Problem:   Acute encephalopathy Active Problems:   Acute hepatic encephalopathy (HCC)   Pancytopenia (HCC)   Essential hypertension   Esophageal varices (HCC)   ESOPHAGITIS, REFLUX   GERD   Constipation   Hepatic cirrhosis (HCC)   Portal hypertension (HCC)   Abdominal pain, generalized   ASCITES   IRON DEFICIENCY ANEMIA, HX OF   cervical Epidural  abscess   Encephalopathy acute   Streptococcal bacteremia   NASH (nonalcoholic steatohepatitis)   Diabetes mellitus type 2, uncontrolled (HCC)   Thrombocytopenia (HCC)   Group B streptococcal infection   Ascites   Hyperammonemia (HCC)   Altered mental status   Alcoholic cirrhosis of liver with ascites ([place])    Time spent: [facility] Pager [phone]. If 7PM-7AM, please contact night-coverage at [email or web address], password [email or web address] 04/28/2015, 8:41 AM  LOS: 2 days

## 2015-04-28 NOTE — Progress Notes (Signed)
Discharge instructions provided and discussed in full with patient and his wife. All questions answered. IV's removed. Patient taken downstairs via wheelchair by NT at 1310.

## 2015-04-28 NOTE — Progress Notes (Signed)
Patient ID: [name], male   DOB: 11/30/1950, 64 y.o.   MRN: [id number]    Progress Note   Subjective  Feels ok -wife says back to baseline mental status Having more frequent BM's. Abdomen more comfortable post paracentesis.     Objective   Vital signs in last 24 hours: Temp:  [97.5 F (36.4 C)-98.9 F (37.2 C)] 98.8 F (37.1 C) (10/22 0800) Pulse Rate:  [74-92] 78 (10/22 0800) Resp:  [12-20] 13 (10/22 0800) BP: (89-111)/(41-69) 91/52 mmHg (10/22 0800) SpO2:  [95 %-100 %] 97 % (10/22 0800) Weight:  [169 lb 5 oz (76.8 kg)] 169 lb 5 oz (76.8 kg) (10/22 0400) Last BM Date: 04/28/15 General:    whitemale in NAD Heart:  Regular rate and rhythm; no murmurs Lungs: Respirations even and unlabored, lungs CTA bilaterally Abdomen:  Soft, nontender and nontense  ascites. Normal bowel sounds. Extremities:  Without edema. Neurologic:  Alert and oriented,  grossly normal neurologically no asterixis Psych:  Cooperative. Normal mood and affect.  Intake/Output from previous day: 10/21 0701 - 10/22 0700 In: 290 [P.O.:240; IV Piggyback:50] Out: 2 [Stool:2] Intake/Output this shift:    Lab Results:  Recent Labs  04/26/15 1140 04/27/15 0415 04/28/15 0415  WBC 3.5* 4.2 2.9*  HGB 11.4* 10.6* 9.8*  HCT 32.8* 30.4* 27.9*  PLT 34* 38* 29*   BMET  Recent Labs  04/26/15 1214 04/27/15 0415 04/28/15 0415  NA 132* 133* 132*  K 4.4 4.3 4.0  CL 100* 101 101  CO2 26 25 22   GLUCOSE 237* 83 112*  BUN 21* 20 20  CREATININE 1.28* 1.07 1.09  CALCIUM 9.2 9.5 9.2   LFT  Recent Labs  04/28/15 0415  PROT 6.6  ALBUMIN 3.5  AST 28  ALT 23  ALKPHOS 56  BILITOT 1.4*   PT/INR  Recent Labs  04/26/15 1432 04/27/15 0415  LABPROT 17.1* 18.2*  INR 1.38 1.50*    Studies/Results: Ct Head Wo Contrast  04/26/2015  CLINICAL DATA:  Altered mental status. Lethargic, off balance today. EXAM: CT HEAD WITHOUT CONTRAST TECHNIQUE: Contiguous axial images were obtained from the base of  the skull through the vertex without intravenous contrast. COMPARISON:  01/30/2015. FINDINGS: No evidence for acute infarction, hemorrhage, mass lesion, hydrocephalus, or extra-axial fluid. Slight premature atrophy. Hypoattenuation of white matter suggesting small vessel disease. No large vessel occlusion. Calvarium. Intraorbital calcification or metallic structure near the RIGHT nasolacrimal duct, could represent postsurgical change or calculi, stable from prior. Otherwise negative orbits, sinuses, and mastoids. IMPRESSION: No acute intracranial findings. Electronically Signed   By: Staci Righter M.D.   On: 04/26/2015 17:22   US Paracentesis  04/27/2015  INDICATION: Cirrhosis, recurrent ascites and request for diagnostic and therapeutic paracentesis. EXAM: ULTRASOUND-GUIDED PARACENTESIS COMPARISON:  Paracentesis 04/04/2015. MEDICATIONS: None. COMPLICATIONS: None immediate TECHNIQUE: Informed written consent was obtained from the patient after a discussion of the risks, benefits and alternatives to treatment. A timeout was performed prior to the initiation of the procedure. Initial ultrasound scanning demonstrates a large amount of ascites within the right lower abdominal quadrant. The right lower abdomen was prepped and draped in the usual sterile fashion. 1% lidocaine was used for local anesthesia. Under direct ultrasound guidance, a 19 gauge, 7-cm, Yueh catheter was introduced. An ultrasound image was saved for documentation purposed. The paracentesis was performed. The catheter was removed and a dressing was applied. The patient tolerated the procedure well without immediate post procedural complication. FINDINGS: A total of approximately 5.2 liters of serous  fluid was removed. Samples were sent to the laboratory as requested by the clinical team. IMPRESSION: Successful ultrasound-guided paracentesis yielding 5.2 liters of peritoneal fluid. Read By:  Tsosie Billing PA-C Electronically Signed   By: Sandi Mariscal  M.D.   On: 04/27/2015 11:03   Dg Chest Portable 1 View  04/26/2015  CLINICAL DATA:  Altered mental status.  Lethargy. EXAM: PORTABLE CHEST 1 VIEW COMPARISON:  01/30/2015 FINDINGS: Very low lung volumes are seen with bibasilar atelectasis. No evidence of pulmonary consolidation or pleural effusion. Heart size is within normal limits allowing for low lung volumes. IMPRESSION: Low lung volumes and mild bibasilar atelectasis. Electronically Signed   By: Earle Gell M.D.   On: 04/26/2015 13:22       Assessment / Plan:    #1 64 yo male with decompensated cirrhosis secondary to NASH with exacerbation of hepatic encephalopathy -noncompliance with meds /off schedule with mothers death etc No SBP Back to baseline  #2 ascites- stable post paracentesis-5.2 liters- cell counts not c/w SBP #3 severe thrombocytopenia -chronic   Plan; pt is stable for  D/C home Will arrange Outpt follow up in 2 weeks in office , with labs prior to visit  Resume diuretics at home-same doses- Lasix 40 mg po daily and aldactone 200 mg po daily Xifaxan 550 mg po BID Chronulac 30 cc TID - increase if not having at least 3 BM's per day  Principal Problem:   Acute encephalopathy Active Problems:   Pancytopenia ([place])   Essential hypertension   Esophageal varices (HCC)   ESOPHAGITIS, REFLUX   GERD   Constipation   Hepatic cirrhosis (HCC)   Portal hypertension (HCC)   Abdominal pain, generalized   ASCITES   IRON DEFICIENCY ANEMIA, HX OF   cervical Epidural abscess   Encephalopathy acute   Streptococcal bacteremia   NASH (nonalcoholic steatohepatitis)   Diabetes mellitus type 2, uncontrolled ([place])   Thrombocytopenia (HCC)   Group B streptococcal infection   Ascites   Hyperammonemia (HCC)   Acute hepatic encephalopathy (HCC)   Altered mental status   Alcoholic cirrhosis of liver with ascites ([place])     LOS: 2 days   [name]  04/28/2015, 9:34 AM  GI ATTENDING  Interval history and data reviewed.  Patient seen and examined. Agree with interval progress note. Agree with assessment and plans exactly as outlined. I have discussed this with the patient and his wife. Ready for discharge.  Docia Chuck. Geri [place]., M.D. [facility] Division of Gastroenterology

## 2015-04-29 LAB — BODY FLUID CULTURE
Culture: NO GROWTH
SPECIAL REQUESTS: NORMAL

## 2015-05-01 ENCOUNTER — Telehealth: Payer: Self-pay

## 2015-05-01 ENCOUNTER — Other Ambulatory Visit: Payer: Self-pay

## 2015-05-01 DIAGNOSIS — R188 Other ascites: Principal | ICD-10-CM

## 2015-05-01 DIAGNOSIS — K7460 Unspecified cirrhosis of liver: Secondary | ICD-10-CM

## 2015-05-01 LAB — CULTURE, BLOOD (ROUTINE X 2)
CULTURE: NO GROWTH
CULTURE: NO GROWTH

## 2015-05-01 NOTE — Telephone Encounter (Signed)
Pt to have labs 05/12/15, orders in epic. Pt scheduled to see [name] PA 05/14/15@1 :30pm. Pt aware of appt.

## 2015-05-01 NOTE — Telephone Encounter (Signed)
Pt scheduled to see [name] PA 05/14/15@1 :30pm. Pt to have labs several days prior to visit.

## 2015-05-01 NOTE — Telephone Encounter (Signed)
-----   Message from [name], PA-C sent at 04/28/2015 10:24 AM EDT ----- Regarding: office follow up [name] is being discharged today after admit with hepatic encephalopathy. He needs follow up in 2 weeks with me or Cecille Rubin, and cbc, cmet,ammonia prior to visit (day or 2 before visit).[name] pt... thanks

## 2015-05-01 NOTE — Telephone Encounter (Signed)
-----   Message from [name], PA-C sent at 04/28/2015 10:24 AM EDT ----- Regarding: office follow up [name] is being discharged today after admit with hepatic encephalopathy. He needs follow up in 2 weeks with me or Cecille Rubin, and cbc, cmet,ammonia prior to visit (day or 2 before visit).[name] pt... thanks

## 2015-05-02 LAB — CULTURE, BODY FLUID-BOTTLE

## 2015-05-02 LAB — CULTURE, BODY FLUID W GRAM STAIN -BOTTLE: Culture: NO GROWTH

## 2015-05-07 LAB — PH, BODY FLUID: PH, BODY FLUID: 7.8

## 2015-05-10 DIAGNOSIS — Z6826 Body mass index (BMI) 26.0-26.9, adult: Secondary | ICD-10-CM | POA: Insufficient documentation

## 2015-05-11 ENCOUNTER — Other Ambulatory Visit (INDEPENDENT_AMBULATORY_CARE_PROVIDER_SITE_OTHER): Payer: Managed Care, Other (non HMO)

## 2015-05-11 DIAGNOSIS — R188 Other ascites: Principal | ICD-10-CM

## 2015-05-11 DIAGNOSIS — K7460 Unspecified cirrhosis of liver: Secondary | ICD-10-CM | POA: Diagnosis not present

## 2015-05-11 LAB — CBC WITH DIFFERENTIAL/PLATELET
BASOS ABS: 0 10*3/uL (ref 0.0–0.1)
Basophils Relative: 0.2 % (ref 0.0–3.0)
EOS ABS: 0.1 10*3/uL (ref 0.0–0.7)
Eosinophils Relative: 1.7 % (ref 0.0–5.0)
HEMATOCRIT: 34.7 % — AB (ref 39.0–52.0)
Hemoglobin: 11.6 g/dL — ABNORMAL LOW (ref 13.0–17.0)
LYMPHS ABS: 0.5 10*3/uL — AB (ref 0.7–4.0)
LYMPHS PCT: 12.7 % (ref 12.0–46.0)
MCHC: 33.6 g/dL (ref 30.0–36.0)
MCV: 90.8 fl (ref 78.0–100.0)
Monocytes Absolute: 0.5 10*3/uL (ref 0.1–1.0)
Monocytes Relative: 13.3 % — ABNORMAL HIGH (ref 3.0–12.0)
NEUTROS ABS: 2.6 10*3/uL (ref 1.4–7.7)
NEUTROS PCT: 72.1 % (ref 43.0–77.0)
RBC: 3.82 Mil/uL — ABNORMAL LOW (ref 4.22–5.81)
RDW: 16.1 % — ABNORMAL HIGH (ref 11.5–15.5)
WBC: 3.6 10*3/uL — ABNORMAL LOW (ref 4.0–10.5)

## 2015-05-11 LAB — COMPREHENSIVE METABOLIC PANEL
ALT: 23 U/L (ref 0–53)
AST: 24 U/L (ref 0–37)
Albumin: 3.4 g/dL — ABNORMAL LOW (ref 3.5–5.2)
Alkaline Phosphatase: 75 U/L (ref 39–117)
BUN: 25 mg/dL — AB (ref 6–23)
CHLORIDE: 98 meq/L (ref 96–112)
CO2: 28 mEq/L (ref 19–32)
Calcium: 9.8 mg/dL (ref 8.4–10.5)
Creatinine, Ser: 1.47 mg/dL (ref 0.40–1.50)
GFR: 51.48 mL/min — ABNORMAL LOW (ref >60.00)
Glucose, Bld: 151 mg/dL — ABNORMAL HIGH (ref 70–99)
Potassium: 4.4 mEq/L (ref 3.5–5.1)
Sodium: 133 mEq/L — ABNORMAL LOW (ref 135–145)
Total Bilirubin: 1.9 mg/dL — ABNORMAL HIGH (ref 0.2–1.2)
Total Protein: 7 g/dL (ref 6.0–8.3)

## 2015-05-11 LAB — AMMONIA: AMMONIA: 60 umol/L — AB (ref 11–35)

## 2015-05-14 ENCOUNTER — Encounter: Payer: Self-pay | Admitting: Physician Assistant

## 2015-05-14 ENCOUNTER — Ambulatory Visit (INDEPENDENT_AMBULATORY_CARE_PROVIDER_SITE_OTHER): Payer: Managed Care, Other (non HMO) | Admitting: Physician Assistant

## 2015-05-14 VITALS — BP 110/70 | HR 72 | Ht 70.0 in | Wt 182.4 lb

## 2015-05-14 DIAGNOSIS — R188 Other ascites: Secondary | ICD-10-CM

## 2015-05-14 DIAGNOSIS — G9340 Encephalopathy, unspecified: Secondary | ICD-10-CM

## 2015-05-14 DIAGNOSIS — K7469 Other cirrhosis of liver: Secondary | ICD-10-CM

## 2015-05-14 MED ORDER — LACTULOSE 10 GM/15ML PO SOLN: ORAL | Status: DC

## 2015-05-14 NOTE — Progress Notes (Signed)
Agree with assessment and plans as outlined 

## 2015-05-14 NOTE — Progress Notes (Signed)
Patient ID: [name], male   DOB: [birth year], 64 y.o.   MRN: [id number]   Subjective:    Patient ID: [name], male    DOB: [birth year], 64 y.o.   MRN: [id number]  HPI  Nam is a pleasant 64 year old white male known to [name] with history of decompensated cirrhosis secondary to [place]. This has recently been complicated by encephalopathy and ascites. He also has a severe thrombocytopenia. He has been established with the liver clinic at [facility] and states he has follow-up in December. Patient had recent hospitalization about 2 weeks ago with acute encephalopathy. He underwent large volume paracentesis and had no evidence of SBP. He had been noncompliant with his medications for several days prior to because of the death of his mother and an interrupted schedule. He was discharged home on Lasix 40 mg by mouth daily Aldactone 200 mg daily Xifaxan 550 twice a day which she's been on chronically and Chronulac 30 mL 3 times a day. Labs were done on 05/11/2015 venous ammonia 60 WBC of 3.6 hemoglobin 11.6 hematocrit of 34.7 platelet count 30,000 creatinine 1.47 total bilirubin 1.9, INR not done. Patient complains of fatigue "feeling bad all over" over the past few days. His wife says she's been eating okay but he seems to have no energy and has been sleeping more during the day. There's been no confusion she feels his responses are times a bit slower. Has not had any falls but does complain of unsteadiness. He says he is taking all of his medications as he is supposed to and is having one to 2 bowel movements per day. He feels that he is retaining fluid in his abdomen says he feels more short of breath when his abdomen is distended and would like to have another paracentesis. Weight is up 5 pounds over the past week  Review of Systems Pertinent positive and negative review of systems were noted in the above HPI section.  All other review of systems was otherwise negative.  Outpatient Encounter  Prescriptions as of 05/14/2015  Medication Sig  . amoxicillin (AMOXIL) 500 MG capsule Take 1 capsule (500 mg total) by mouth 3 (three) times daily.  . baclofen (LIORESAL) 10 MG tablet Take 0.5-1 tablets (5-10 mg total) by mouth at bedtime.  . bethanechol (URECHOLINE) 50 MG tablet Take 1 tablet by mouth 2 (two) times daily.   . Dulaglutide 1.5 MG/0.5ML SOPN Inject 1.5 mg into the skin once a week. On thursdays  . FLUoxetine (PROZAC) 10 MG tablet Take 10 mg by mouth daily.  . furosemide (LASIX) 20 MG tablet TAKE 2 TABLETS EVERY DAY  . gabapentin (NEURONTIN) 100 MG capsule Take 300 mg by mouth 3 (three) times daily as needed (May increase to 4 capsules every 8 hours as needed).   . Insulin Degludec (TRESIBA FLEXTOUCH) 200 UNIT/ML SOPN Inject 48-60 Units into the skin daily.   Marland Kitchen lactulose (CHRONULAC) 10 GM/15ML solution Take 3 tablespoons 4 times daily.  . Multiple Vitamin (MULTIVITAMIN WITH MINERALS) TABS tablet Take 1 tablet by mouth daily.  . pantoprazole (PROTONIX) 40 MG tablet TAKE 1 TABLET (40 MG TOTAL) BY MOUTH DAILY.  Marland Kitchen spironolactone (ALDACTONE) 100 MG tablet Take 1 tablet (100 mg total) by mouth daily.  . tamsulosin (FLOMAX) 0.4 MG CAPS capsule Take 1 capsule by mouth daily. Take once daily by mouth 30 minutes after supper  . Vitamin D, Ergocalciferol, (DRISDOL) 50000 UNITS CAPS capsule Take 50,000 Units by mouth every 7 (seven) days.  Marland Kitchen  XIFAXAN 550 MG TABS tablet TAKE 1 TABLET (550 MG TOTAL) BY MOUTH 2 (TWO) TIMES DAILY.  . [DISCONTINUED] lactulose (CHRONULAC) 10 GM/15ML solution Take 45 mLs (30 g total) by mouth 3 (three) times daily.   No facility-administered encounter medications on file as of 05/14/2015.   No Known Allergies Patient Active Problem List   Diagnosis Date Noted  . Alcoholic cirrhosis of liver with ascites ([place])   . Altered mental status 04/26/2015  . Acute encephalopathy 04/26/2015  . Encephalopathy, hepatic ([place]) 02/08/2015  . Hyperammonemia ([place]) 01/30/2015  .  Hepatic encephalopathy ([place]) 01/30/2015  . Acute hepatic encephalopathy ([place]) 01/30/2015  . Ascites 10/30/2014  . Ileus, postoperative 10/26/2014  . Left cervical radiculopathy 10/25/2014  . Cervical myelopathy ([place]) 10/25/2014  . Thrombocytopenia ([place]) 10/22/2014  . Group B streptococcal infection   . Abscess in epidural space of cervical spine   . Encephalopathy acute   . Streptococcal bacteremia   . NASH (nonalcoholic steatohepatitis)   . Other cirrhosis of liver ([place])   . Diabetes mellitus type 2, uncontrolled ([place])   . 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 UGIB (upper gastrointestinal bleed) 10/27/2012  . Anemia due to acute blood loss 10/27/2012  . Volume depletion 10/27/2012  . Bleeding esophageal varices ([place]) 10/27/2012  . GERD 03/28/2010  . Constipation 03/28/2010  . Abdominal pain, generalized 03/28/2010  . ASCITES 06/13/2008  . PERSONAL HX COLONIC POLYPS 06/13/2008  . DM 09/27/2007  . OBESITY 09/27/2007  . Pancytopenia ([place]) 09/27/2007  . Essential hypertension 09/27/2007  . INTERNAL HEMORRHOIDS 09/27/2007  . Hepatic cirrhosis ([place]) 09/27/2007  . NEPHROLITHIASIS 09/27/2007  . IRON DEFICIENCY ANEMIA, HX OF 09/27/2007  . COLONIC POLYPS, ADENOMATOUS 11/19/2006  . Esophageal varices ([place]) 11/19/2006  . DUODENITIS 11/19/2006  . ESOPHAGITIS, REFLUX 09/23/2004  . ESOPHAGEAL STRICTURE 09/23/2004  . Portal hypertension ([place]) 09/23/2004  . DIVERTICULOSIS, COLON 08/14/2004   Social History   Social History  . Marital Status: Married    Spouse Name: N/A  . Number of Children: N/A  . Years of Education: N/A   Occupational History  . retired    Social History Main Topics  . Smoking status: Never Smoker   . Smokeless tobacco: Never Used  . Alcohol Use: No     Comment: very rare  . Drug Use: No  . Sexual Activity: No   Other Topics Concern  . Not on file   Social History Narrative    Mr. Gagliano's family history includes Aneurysm in his mother; Diabetes  in his father and another family member; Heart disease in his father and other. There is no history of Colon cancer, Anesthesia problems, Hypotension, Malignant hyperthermia, or Pseudochol deficiency.      Objective:    Filed Vitals:   05/14/15 1321  BP: 110/70  Pulse: 72    Physical Exam  well-developed older white male in no acute distress, accompanied by his wife blood pressure 110/70 pulse 72 height 5 foot 10 weight 182. HEENT ;nontraumatic normocephalic EOMI PERRLA sclera anicteric, He does have fetor  hepaticus, Cardiovascular; regular rate and rhythm with S1-S2, pulmonary clear bilaterally, Abdomen;large protuberant with fairly tense ascites no palpable mass no focal tenderness bowel sounds present, Rectal exam not done, Extremities ;no clubbing cyanosis or edema skin warm and dry, Neuropsych ;mood and affect appropriate trace asterixis       Assessment & Plan:   #1 64 yo male with decompensated cirrhosis secondary to NASH- recent admit with exacerbation of hepatic encephalopathy secondary to nonintentional  non compliance with meds. Fatigue and increased sleepiness persist #2 ascites- s/p large volume paracentesis 2 weeks ago- weight on the rise again #3 severe thrombocytopenia- plts around 30 #4 CRI #5 AODM #6 Group B strep epidural abscess- ID managing  Plan; Will schedule for another large volume paracentesis to remove 5-6 L of fluid and we will replace with albumin Strict 2 g sodium diet Continue same doses of diuretics with Lasix 40 mg by mouth daily and Aldactone 200 mg by mouth daily Increase Chronulac to 3 tablespoons 4 times daily, and emphasized importance of having 3-5 bowel movements per day. His wife is to monitor and if no improvement in his fatigue and daytime sleepiness she will increase to 4 tablespoons 4 times daily. Follow-up with [name] in one month or sooner when necessary and patient is to have labs prior to that visit to include INR to calculate  MELD He appears to be slowly declining.     Amy S Esterwood PA-C 05/14/2015   Cc: [name], MD

## 2015-05-14 NOTE — Patient Instructions (Signed)
Increaser Chronulac to 3 tablespoons 4 times daily. If no better in 4-5 days, increaser to 4 tablespoons 4 times dialy.  Come to our lab on 06-14-2015.  You have an office visit with [name] on 06-18-2015 at 1:30 PM .  You have been scheduled for an abdominal ultrasound Paracentesis at [facility]iology (1st floor of hospital) on 05-21-2015 at 11:00 am . Please arrive 15 minutes prior to your appointment for registration. Make certain not to have anything to eat or drink 6 hours prior to your appointment. Should you need to reschedule your appointment, please contact radiology at [phone]. This test typically takes about 30 minutes to perform. [place]

## 2015-05-18 ENCOUNTER — Other Ambulatory Visit: Payer: Self-pay | Admitting: *Deleted

## 2015-05-18 DIAGNOSIS — R188 Other ascites: Secondary | ICD-10-CM

## 2015-05-21 ENCOUNTER — Telehealth: Payer: Self-pay

## 2015-05-21 ENCOUNTER — Encounter (HOSPITAL_COMMUNITY)
Admission: RE | Admit: 2015-05-21 | Discharge: 2015-05-21 | Disposition: A | Discharge status: Home / Self Care | Payer: Managed Care, Other (non HMO) | Source: Ambulatory Visit | Attending: Internal Medicine | Admitting: Internal Medicine

## 2015-05-21 ENCOUNTER — Ambulatory Visit (HOSPITAL_COMMUNITY): Admission: RE | Admit: 2015-05-21 | Payer: Managed Care, Other (non HMO) | Source: Ambulatory Visit

## 2015-05-21 ENCOUNTER — Ambulatory Visit (HOSPITAL_COMMUNITY)
Admission: RE | Admit: 2015-05-21 | Discharge: 2015-05-21 | Disposition: A | Discharge status: Home / Self Care | Payer: Managed Care, Other (non HMO) | Source: Ambulatory Visit | Attending: Physician Assistant | Admitting: Physician Assistant

## 2015-05-21 ENCOUNTER — Encounter (HOSPITAL_COMMUNITY): Payer: Self-pay

## 2015-05-21 DIAGNOSIS — Z029 Encounter for administrative examinations, unspecified: Secondary | ICD-10-CM | POA: Diagnosis present

## 2015-05-21 DIAGNOSIS — K7460 Unspecified cirrhosis of liver: Secondary | ICD-10-CM | POA: Diagnosis not present

## 2015-05-21 DIAGNOSIS — R188 Other ascites: Secondary | ICD-10-CM | POA: Diagnosis not present

## 2015-05-21 LAB — GLUCOSE, CAPILLARY: GLUCOSE-CAPILLARY: 100 mg/dL — AB (ref 65–99)

## 2015-05-21 MED ORDER — FUROSEMIDE 20 MG PO TABS: 40.0000 mg | ORAL_TABLET | Freq: Every day | ORAL | Status: DC

## 2015-05-21 MED ORDER — SODIUM CHLORIDE 0.9 % IV SOLN: Freq: Once | INTRAVENOUS | Status: DC

## 2015-05-21 MED ORDER — ALBUMIN HUMAN 25 % IV SOLN
50.0000 g | Freq: Once | INTRAVENOUS | Status: AC
  Administered 2015-05-21: 50 g via INTRAVENOUS
  Filled 2015-05-21: qty 200

## 2015-05-21 NOTE — Procedures (Signed)
Successful US guided paracentesis from [facility].  Yielded 6 liter maximum ordered of serous fluid.  No immediate complications.  Pt tolerated well.   Specimen was not sent for labs.  Tsosie Billing D PA-C 05/21/2015 11:39 AM

## 2015-05-21 NOTE — Telephone Encounter (Signed)
Resent Furosemide as 90 day supply

## 2015-05-21 NOTE — Discharge Instructions (Signed)
Albumin injection °What is this medicine? °ALBUMIN (al BYOO min) is used to treat or prevent shock following serious injury, bleeding, surgery, or burns by increasing the volume of blood plasma. This medicine can also replace low blood protein. °This medicine may be used for other purposes; ask your health care provider or pharmacist if you have questions. °What should I tell my health care provider before I take this medicine? °They need to know if you have any of the following conditions: °-anemia °-heart disease °-kidney disease °-an unusual or allergic reaction to albumin, other medicines, foods, dyes, or preservatives °-pregnant or trying to get pregnant °-breast-feeding °How should I use this medicine? °This medicine is for infusion into a vein. It is given by a health-care professional in a hospital or clinic. °Talk to your pediatrician regarding the use of this medicine in children. While this drug may be prescribed for selected conditions, precautions do apply. °Overdosage: If you think you have taken too much of this medicine contact a poison control center or emergency room at once. °NOTE: This medicine is only for you. Do not share this medicine with others. °What if I miss a dose? °This does not apply. °What may interact with this medicine? °Interactions are not expected. °This list may not describe all possible interactions. Give your health care provider a list of all the medicines, herbs, non-prescription drugs, or dietary supplements you use. Also tell them if you smoke, drink alcohol, or use illegal drugs. Some items may interact with your medicine. °What should I watch for while using this medicine? °Your condition will be closely monitored while you receive this medicine. °Some products are derived from human plasma, and there is a small risk that these products may contain certain types of virus or bacteria. All products are processed to kill most viruses and bacteria. If you have questions  concerning the risk of infections, discuss them with your doctor or health care professional. °What side effects may I notice from receiving this medicine? °Side effects that you should report to your doctor or health care professional as soon as possible: °-allergic reactions like skin rash, itching or hives, swelling of the face, lips, or tongue °-breathing problems °-changes in heartbeat °-fever, chills °-pain, redness or swelling at the injection site °-signs of viral infection including fever, drowsiness, chills, runny nose followed in about 2 weeks by a rash and joint pain °-tightness in the chest °Side effects that usually do not require medical attention (report to your doctor or health care professional if they continue or are bothersome): °-increased salivation °-nausea, vomiting °This list may not describe all possible side effects. Call your doctor for medical advice about side effects. You may report side effects to FDA at 1-800-FDA-1088. °Where should I keep my medicine? °This does not apply. You will not be given this medicine to store at home. °NOTE: This sheet is a summary. It may not cover all possible information. If you have questions about this medicine, talk to your doctor, pharmacist, or health care provider. °  °© 2016, Elsevier/Gold Standard. (2007-09-16 10:18:55) °Paracentesis °Paracentesis is a procedure to remove excess fluid (ascites) from the belly (abdomen). Ascites can result from certain conditions, such as infection, inflammation, abdominal injury, heart failure, chronic scarring of the liver (cirrhosis), or cancer. Ascites is removed using a needle that is inserted through the skin and tissue into the abdomen. °This procedure may be done: °· To determine the cause of the ascites. °· To relieve symptoms that are caused   by the ascites, such as pain or shortness of breath. °· To see if there is bleeding after an abdominal injury. °LET YOUR HEALTH CARE PROVIDER KNOW ABOUT: °· Any  allergies you have. °· All medicines you are taking, including vitamins, herbs, eye drops, creams, and over-the-counter medicines. °· Previous problems you or members of your family have had with the use of anesthetics. °· Any blood disorders you have. °· Previous surgeries you have had. °· Any medical conditions you have. °· Whether you are pregnant or may be pregnant. °RISKS AND COMPLICATIONS °Generally, this is a safe procedure. However, problems may occur, including: °· Infection. °· Bleeding. °· Injury to an abdominal organ, such as the bowel (large intestine), liver, spleen, or bladder. °· Low blood pressure (hypotension). °· Spreading of cancer, if there are cancer cells in the abdominal fluid. °· Mental status changes in people who have liver disease. These changes would be caused by shifts in the balance of fluids and minerals (electrolytes) in the body. °BEFORE THE PROCEDURE °· Ask your health care provider about: °¨ Changing or stopping your regular medicines. This is especially important if you are taking diabetes medicines or blood thinners. °¨ Taking medicines such as aspirin and ibuprofen. These medicines can thin your blood. Do not take these medicines before your procedure if your health care provider instructs you not to. °· A blood sample may be done to determine your blood clotting time. °· You will be asked to urinate. °PROCEDURE °· You may be asked to lie on your back with your head raised (elevated). °· To reduce your risk of infection: °¨ Your health care team will wash or sanitize their hands. °¨ Your skin will be washed with soap. °· You will be given a medicine to numb the area (local anesthetic). °· Your abdominal skin will be punctured with a needle or a scalpel. °· A drainage tube will be inserted through the puncture site. Fluid will drain through the tube into a container. °· After enough fluid has been removed, the tube will be removed. °· A sample of the fluid will be sent for  examination. °· A bandage (dressing) will be placed over the puncture site. °The procedure may vary among health care providers and hospitals. °AFTER THE PROCEDURE °· It is your responsibility to get your test results. Ask your health care provider or the department performing the test when your results will be ready. °  °This information is not intended to replace advice given to you by your health care provider. Make sure you discuss any questions you have with your health care provider. °  °Document Released: 01/06/2005 Document Revised: 03/14/2015 Document Reviewed: 09/05/2014 °Elsevier Interactive Patient Education ©2016 [employer]. ° ° °

## 2015-05-23 ENCOUNTER — Ambulatory Visit (INDEPENDENT_AMBULATORY_CARE_PROVIDER_SITE_OTHER): Payer: Managed Care, Other (non HMO) | Admitting: Neurology

## 2015-05-23 ENCOUNTER — Encounter: Payer: Self-pay | Admitting: Neurology

## 2015-05-23 VITALS — BP 125/78 | HR 73 | Ht 70.0 in | Wt 180.5 lb

## 2015-05-23 DIAGNOSIS — R188 Other ascites: Principal | ICD-10-CM

## 2015-05-23 DIAGNOSIS — D696 Thrombocytopenia, unspecified: Secondary | ICD-10-CM | POA: Diagnosis not present

## 2015-05-23 DIAGNOSIS — K7460 Unspecified cirrhosis of liver: Secondary | ICD-10-CM

## 2015-05-23 DIAGNOSIS — K7469 Other cirrhosis of liver: Secondary | ICD-10-CM

## 2015-05-23 NOTE — Progress Notes (Signed)
Reason for visit: Myelitis  Referring physician: Dr. Alfonse Spruce is a 64 y.o. male  History of present illness:  [name] is a 64 year old right-handed white male with a history of nonalcoholic cirrhosis of the liver. 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 The patient had epidural abscess in the cervical and lumbar spine areas. The patient did not undergo surgery, but he has been followed through infectious disease with ongoing antibiotic therapy that continues to the present date. The patient is felt to have a discitis problem at the C3 level. The patient denies any fevers, he does report some problems with chills. He has had some improvement initially, he stopped using a cane for ambulation about 3 months ago. He denies any issues controlling the bowels or the bladder, but he does have chronic problems with constipation. He may have some difficulty initiating urination. The patient has begun to have some worsening of his ability to walk, and some increased weakness on the left arm and leg a month ago. The patient has had a recent MRI of the cervical spine done on 05/09/2015. This showed evidence of significant edema within the cervical spinal cord, with expansion of the cord. The patient reports some tingling in the left hand, and he has fallen on occasion. The patient denies any headache, difficulty swallowing, difficulty breathing. He does have some problems with memory and disorientation at times, he has had elevations in the ammonia levels associated with his liver disease. The patient has required paracentesis of the peritoneal cavity, and he has been found to have low platelet levels running around 30K, with pancytopenia. He is sent to this office for further evaluation of the new changes of the spinal cord by MRI.  Past Medical History  Diagnosis Date  . Reflux esophagitis   . Portal  hypertension ([place])   . Stricture and stenosis of esophagus   . Diverticulosis of colon (without mention of hemorrhage)   . Internal hemorrhoids without mention of complication   . Benign neoplasm of colon   . Duodenitis without mention of hemorrhage   . 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 Personal history of diseases of blood and blood-forming organs   . 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   Marland Kitchen Unspecified essential hypertension   . Obesity, unspecified   . Type II or unspecified type diabetes mellitus without mention of complication, not stated as uncontrolled   . GERD (gastroesophageal reflux disease)   . Hemorrhoids   . Colon polyps 2004, 2006, 2008, 11/2011    Adenomatous and tubulovillous adenoma.  . Pancytopenia   . Calculus of kidney yrs ago  . Ascites     Past Surgical History  Procedure Laterality Date  . Kidney stone surgery  yrs ago  . Esophagogastroduodenoscopy  08/01/2011    Procedure: ESOPHAGOGASTRODUODENOSCOPY (EGD);  Surgeon: Scarlette Shorts, MD;  Location: [facility];  Service: Endoscopy;  Laterality: N/A;  . Esophagogastroduodenoscopy N/A 10/27/2012    Procedure: ESOPHAGOGASTRODUODENOSCOPY (EGD);  Surgeon: Inda Castle, MD;  Location: [facility];  Service: Endoscopy;  Laterality: N/A;  . Esophagogastroduodenoscopy N/A 11/22/2012    Procedure: ESOPHAGOGASTRODUODENOSCOPY (EGD);  Surgeon: Irene Shipper, MD;  Location: [facility];  Service: Endoscopy;  Laterality: N/A;  . Colonoscopy    . Upper gastrointestinal endoscopy    . Esophagogastroduodenoscopy N/A 11/29/2013    Procedure: ESOPHAGOGASTRODUODENOSCOPY (EGD);  Surgeon: Irene Shipper, MD;  Location: [facility];  Service: Endoscopy;  Laterality: N/A;  . Esophageal banding Bilateral 11/29/2013    Procedure: ESOPHAGEAL BANDING;  Surgeon: Irene Shipper, MD;  Location: [facility];  Service: Endoscopy;  Laterality: Bilateral;  . Esophagogastroduodenoscopy N/A 06/13/2014     Procedure: ESOPHAGOGASTRODUODENOSCOPY (EGD);  Surgeon: Irene Shipper, MD;  Location: [facility];  Service: Endoscopy;  Laterality: N/A;  . Esophageal banding N/A 06/13/2014    Procedure: ESOPHAGEAL BANDING;  Surgeon: Irene Shipper, MD;  Location: [facility];  Service: Endoscopy;  Laterality: N/A;    Family History  Problem Relation Age of Onset  . Colon cancer Neg Hx   . Anesthesia problems Neg Hx   . Hypotension Neg Hx   . Malignant hyperthermia Neg Hx   . Pseudochol deficiency Neg Hx   . Diabetes Father   . Heart disease Father   . Aneurysm Mother   . Heart disease Other     neice  . Diabetes      niece    Social history:  reports that he has never smoked. He has never used smokeless tobacco. He reports that he does not drink alcohol or use illicit drugs.  Medications:  Prior to Admission medications   Medication Sig Start Date End Date Taking? Authorizing Provider  amoxicillin (AMOXIL) 500 MG capsule Take 1 capsule (500 mg total) by mouth 3 (three) times daily. 01/16/15  Yes Truman Hayward, MD  baclofen (LIORESAL) 10 MG tablet Take 0.5-1 tablets (5-10 mg total) by mouth at bedtime. 04/11/15  Yes Meredith Staggers, MD  bethanechol (URECHOLINE) 50 MG tablet Take 1 tablet by mouth 2 (two) times daily.  12/19/14  Yes Historical Provider, MD  Dulaglutide 1.5 MG/0.5ML SOPN Inject 1.5 mg into the skin once a week. On thursdays   Yes Historical Provider, MD  FLUoxetine (PROZAC) 10 MG tablet Take 10 mg by mouth daily.   Yes Historical Provider, MD  furosemide (LASIX) 20 MG tablet Take 2 tablets (40 mg total) by mouth daily. 05/21/15  Yes Irene Shipper, MD  gabapentin (NEURONTIN) 100 MG capsule Take 300 mg by mouth 3 (three) times daily as needed (May increase to 4 capsules every 8 hours as needed).    Yes Historical Provider, MD  Insulin Degludec (TRESIBA FLEXTOUCH) 200 UNIT/ML SOPN Inject 48-60 Units into the skin daily.    Yes Historical Provider, MD  lactulose (CHRONULAC) 10 GM/15ML  solution Take 3 tablespoons 4 times daily. 05/14/15  Yes Amy S Esterwood, PA-C  Multiple Vitamin (MULTIVITAMIN WITH MINERALS) TABS tablet Take 1 tablet by mouth daily.   Yes Historical Provider, MD  pantoprazole (PROTONIX) 40 MG tablet TAKE 1 TABLET (40 MG TOTAL) BY MOUTH DAILY. 11/13/14  Yes Irene Shipper, MD  propranolol (INDERAL) 20 MG tablet Take 20 mg by mouth daily.   Yes Historical Provider, MD  spironolactone (ALDACTONE) 100 MG tablet Take 1 tablet (100 mg total) by mouth daily. 04/28/15  Yes Eugenie Filler, MD  tamsulosin (FLOMAX) 0.4 MG CAPS capsule Take 1 capsule by mouth daily. Take once daily by mouth 30 minutes after supper 12/19/14  Yes Historical Provider, MD  Vitamin D, Ergocalciferol, (DRISDOL) 50000 UNITS CAPS capsule Take 50,000 Units by mouth every 7 (seven) days. 03/29/15  Yes Historical Provider, MD  XIFAXAN 550 MG TABS tablet TAKE 1 TABLET (550 MG TOTAL) BY MOUTH 2 (TWO) TIMES DAILY. 04/30/15  Yes Irene Shipper, MD     No  Known Allergies  ROS:  Out of a complete 14 system review of symptoms, the patient complains only of the following symptoms, and all other reviewed systems are negative.  Chills Fatigue Constipation Itching Impotence Easy bruising, easy bleeding Feeling cold Joint pain, muscle cramps, aching muscles Memory loss, confusion, numbness Not enough sleep, disinterest in activities Insomnia, restless legs  Blood pressure 125/78, pulse 73, height 5\' 10"  (1.778 m), weight 180 lb 8 oz (81.874 kg).  Physical Exam  General: The patient is alert and cooperative at the time of the examination.  Eyes: Pupils are equal, round, and reactive to light. Discs are flat bilaterally.  Neck: The neck is supple, no carotid bruits are noted.  Respiratory: The respiratory examination is clear.  Cardiovascular: The cardiovascular examination reveals a regular rate and rhythm, no obvious murmurs or rubs are noted.  Skin: Extremities are without significant  edema.  Neurologic Exam  Mental status: The patient is alert and oriented x 3 at the time of the examination. The patient has apparent normal recent and remote memory, with an apparently normal attention span and concentration ability.  Cranial nerves: Facial symmetry is present. There is good sensation of the face to pinprick and soft touch bilaterally. The strength of the facial muscles and the muscles to head turning and shoulder shrug are normal bilaterally. Speech is well enunciated, no aphasia or dysarthria is noted. Extraocular movements are full. Visual fields are full. The tongue is midline, and the patient has symmetric elevation of the soft palate. No obvious hearing deficits are noted.  Motor: The motor testing reveals 5 over 5 strength of all 4 extremities with the exception of some slight weakness of grip with the left hand. Good symmetric motor tone is noted throughout.  Sensory: Sensory testing is intact to pinprick, soft touch, vibration sensation, and position sense on all 4 extremities, with exception of some decrease in position sense in both feet. No evidence of extinction is noted.  Coordination: Cerebellar testing reveals good finger-nose-finger and heel-to-shin bilaterally.  Gait and station: Gait is slightly wide-based, the patient can walk independently. Tandem gait was not attempted. Romberg is negative. No drift is seen. No evidence of asterixis is noted.  Reflexes: Deep tendon reflexes are symmetric and normal bilaterally. Toes are downgoing bilaterally.   Assessment/Plan:  1. Nonalcoholic cirrhosis of the liver  2. Pancytopenia  3. Epidural abscess, cervical and lumbar area  4. 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 Treatment for this is quite limited at this time, ongoing antibiotic therapy for treatment  of the underlying infection is continuing. I do not believe that the patient has an infectious myelitis. The patient is continued be followed by infectious disease. The patient will follow-up if needed.  Jonathan Alexanders MD 05/23/2015 6:40 PM  [facility] [address] [place], [place] [address]  Phone [phone] Fax [phone]

## 2015-05-25 ENCOUNTER — Telehealth: Payer: Self-pay | Admitting: Internal Medicine

## 2015-05-25 NOTE — Telephone Encounter (Signed)
Left message for pt to call back.  Pts wife states pts abdomen is "swelling up again." Asked how much weight gain he has had and wife could not answer the question. Pt requesting to have a paracentesis. Pt just had [place] para done on 05/21/15 and 6 liters of fluid were removed. [name] please advise.

## 2015-05-26 NOTE — Telephone Encounter (Signed)
Get BMET. Then schedule large volume paracentesis (up to 8L) with albumin replacement. Send fluid for cell count with diff. They must record daily weights (same time each day, if possible) and limit sodium to 2 grams. No added salt

## 2015-05-28 ENCOUNTER — Other Ambulatory Visit: Payer: Self-pay

## 2015-05-28 DIAGNOSIS — R188 Other ascites: Secondary | ICD-10-CM

## 2015-05-28 NOTE — Telephone Encounter (Signed)
Pt to come for bmet. Pt scheduled for [place] para at [facility] 06/01/15@7 :30am. Pt to arrive there at 7:15am. Pt to receive albumin replacement and max of 8 liters removed. Pt knows to record daily wts and limit sodium to 2gm and no added salt. Pt aware of appt.

## 2015-05-29 ENCOUNTER — Other Ambulatory Visit: Payer: Self-pay

## 2015-05-29 ENCOUNTER — Other Ambulatory Visit (INDEPENDENT_AMBULATORY_CARE_PROVIDER_SITE_OTHER): Payer: Managed Care, Other (non HMO)

## 2015-05-29 DIAGNOSIS — R188 Other ascites: Secondary | ICD-10-CM

## 2015-05-29 DIAGNOSIS — K7460 Unspecified cirrhosis of liver: Secondary | ICD-10-CM

## 2015-05-29 LAB — BASIC METABOLIC PANEL
BUN: 32 mg/dL — AB (ref 6–23)
CHLORIDE: 98 meq/L (ref 96–112)
CO2: 27 meq/L (ref 19–32)
CREATININE: 1.65 mg/dL — AB (ref 0.40–1.50)
Calcium: 9.5 mg/dL (ref 8.4–10.5)
GFR: 45.05 mL/min — ABNORMAL LOW (ref >60.00)
Glucose, Bld: 350 mg/dL — ABNORMAL HIGH (ref 70–99)
Potassium: 4.9 mEq/L (ref 3.5–5.1)
Sodium: 132 mEq/L — ABNORMAL LOW (ref 135–145)

## 2015-06-01 ENCOUNTER — Ambulatory Visit (HOSPITAL_COMMUNITY): Payer: Managed Care, Other (non HMO)

## 2015-06-05 ENCOUNTER — Ambulatory Visit (HOSPITAL_COMMUNITY)
Admission: RE | Admit: 2015-06-05 | Discharge: 2015-06-05 | Disposition: A | Discharge status: Home / Self Care | Payer: Managed Care, Other (non HMO) | Source: Ambulatory Visit | Attending: Internal Medicine | Admitting: Internal Medicine

## 2015-06-05 ENCOUNTER — Encounter (HOSPITAL_COMMUNITY)
Admission: RE | Admit: 2015-06-05 | Discharge: 2015-06-05 | Disposition: A | Discharge status: Home / Self Care | Payer: Managed Care, Other (non HMO) | Source: Ambulatory Visit | Attending: Internal Medicine | Admitting: Internal Medicine

## 2015-06-05 DIAGNOSIS — R188 Other ascites: Secondary | ICD-10-CM | POA: Insufficient documentation

## 2015-06-05 DIAGNOSIS — K7460 Unspecified cirrhosis of liver: Secondary | ICD-10-CM | POA: Insufficient documentation

## 2015-06-05 LAB — BODY FLUID CELL COUNT WITH DIFFERENTIAL
Eos, Fluid: 0 %
Lymphs, Fluid: 21 %
MONOCYTE-MACROPHAGE-SEROUS FLUID: 74 % (ref 50–90)
Neutrophil Count, Fluid: 5 % (ref 0–25)
Total Nucleated Cell Count, Fluid: 108 cu mm (ref 0–1000)

## 2015-06-05 MED ORDER — ALBUMIN HUMAN 25 % IV SOLN
55.0000 g | Freq: Once | INTRAVENOUS | Status: AC
  Administered 2015-06-05: 55 g via INTRAVENOUS
  Filled 2015-06-05: qty 250

## 2015-06-05 MED ORDER — SODIUM CHLORIDE 0.9 % IV SOLN
Freq: Once | INTRAVENOUS | Status: AC
  Administered 2015-06-05: 250 mL via INTRAVENOUS

## 2015-06-05 NOTE — Procedures (Signed)
US guided diagnostic/therapeutic paracentesis performed yielding 6.9 liters slightly hazy, yellow fluid. A portion of the fluid was sent to the lab for preordered studies. No immediate complications. The pt will receive IV albumin postprocedure.

## 2015-06-05 NOTE — Discharge Instructions (Signed)
Albumin injection °What is this medicine? °ALBUMIN (al BYOO min) is used to treat or prevent shock following serious injury, bleeding, surgery, or burns by increasing the volume of blood plasma. This medicine can also replace low blood protein. °This medicine may be used for other purposes; ask your health care provider or pharmacist if you have questions. °What should I tell my health care provider before I take this medicine? °They need to know if you have any of the following conditions: °-anemia °-heart disease °-kidney disease °-an unusual or allergic reaction to albumin, other medicines, foods, dyes, or preservatives °-pregnant or trying to get pregnant °-breast-feeding °How should I use this medicine? °This medicine is for infusion into a vein. It is given by a health-care professional in a hospital or clinic. °Talk to your pediatrician regarding the use of this medicine in children. While this drug may be prescribed for selected conditions, precautions do apply. °Overdosage: If you think you have taken too much of this medicine contact a poison control center or emergency room at once. °NOTE: This medicine is only for you. Do not share this medicine with others. °What if I miss a dose? °This does not apply. °What may interact with this medicine? °Interactions are not expected. °This list may not describe all possible interactions. Give your health care provider a list of all the medicines, herbs, non-prescription drugs, or dietary supplements you use. Also tell them if you smoke, drink alcohol, or use illegal drugs. Some items may interact with your medicine. °What should I watch for while using this medicine? °Your condition will be closely monitored while you receive this medicine. °Some products are derived from human plasma, and there is a small risk that these products may contain certain types of virus or bacteria. All products are processed to kill most viruses and bacteria. If you have questions  concerning the risk of infections, discuss them with your doctor or health care professional. °What side effects may I notice from receiving this medicine? °Side effects that you should report to your doctor or health care professional as soon as possible: °-allergic reactions like skin rash, itching or hives, swelling of the face, lips, or tongue °-breathing problems °-changes in heartbeat °-fever, chills °-pain, redness or swelling at the injection site °-signs of viral infection including fever, drowsiness, chills, runny nose followed in about 2 weeks by a rash and joint pain °-tightness in the chest °Side effects that usually do not require medical attention (report to your doctor or health care professional if they continue or are bothersome): °-increased salivation °-nausea, vomiting °This list may not describe all possible side effects. Call your doctor for medical advice about side effects. You may report side effects to FDA at 1-800-FDA-1088. °Where should I keep my medicine? °This does not apply. You will not be given this medicine to store at home. °NOTE: This sheet is a summary. It may not cover all possible information. If you have questions about this medicine, talk to your doctor, pharmacist, or health care provider. °  °© 2016, Elsevier/Gold Standard. (2007-09-16 10:18:55) °Paracentesis °Paracentesis is a procedure to remove excess fluid (ascites) from the belly (abdomen). Ascites can result from certain conditions, such as infection, inflammation, abdominal injury, heart failure, chronic scarring of the liver (cirrhosis), or cancer. Ascites is removed using a needle that is inserted through the skin and tissue into the abdomen. °This procedure may be done: °· To determine the cause of the ascites. °· To relieve symptoms that are caused   by the ascites, such as pain or shortness of breath. °· To see if there is bleeding after an abdominal injury. °LET YOUR HEALTH CARE PROVIDER KNOW ABOUT: °· Any  allergies you have. °· All medicines you are taking, including vitamins, herbs, eye drops, creams, and over-the-counter medicines. °· Previous problems you or members of your family have had with the use of anesthetics. °· Any blood disorders you have. °· Previous surgeries you have had. °· Any medical conditions you have. °· Whether you are pregnant or may be pregnant. °RISKS AND COMPLICATIONS °Generally, this is a safe procedure. However, problems may occur, including: °· Infection. °· Bleeding. °· Injury to an abdominal organ, such as the bowel (large intestine), liver, spleen, or bladder. °· Low blood pressure (hypotension). °· Spreading of cancer, if there are cancer cells in the abdominal fluid. °· Mental status changes in people who have liver disease. These changes would be caused by shifts in the balance of fluids and minerals (electrolytes) in the body. °BEFORE THE PROCEDURE °· Ask your health care provider about: °¨ Changing or stopping your regular medicines. This is especially important if you are taking diabetes medicines or blood thinners. °¨ Taking medicines such as aspirin and ibuprofen. These medicines can thin your blood. Do not take these medicines before your procedure if your health care provider instructs you not to. °· A blood sample may be done to determine your blood clotting time. °· You will be asked to urinate. °PROCEDURE °· You may be asked to lie on your back with your head raised (elevated). °· To reduce your risk of infection: °¨ Your health care team will wash or sanitize their hands. °¨ Your skin will be washed with soap. °· You will be given a medicine to numb the area (local anesthetic). °· Your abdominal skin will be punctured with a needle or a scalpel. °· A drainage tube will be inserted through the puncture site. Fluid will drain through the tube into a container. °· After enough fluid has been removed, the tube will be removed. °· A sample of the fluid will be sent for  examination. °· A bandage (dressing) will be placed over the puncture site. °The procedure may vary among health care providers and hospitals. °AFTER THE PROCEDURE °· It is your responsibility to get your test results. Ask your health care provider or the department performing the test when your results will be ready. °  °This information is not intended to replace advice given to you by your health care provider. Make sure you discuss any questions you have with your health care provider. °  °Document Released: 01/06/2005 Document Revised: 03/14/2015 Document Reviewed: 09/05/2014 °Elsevier Interactive Patient Education ©2016 [employer]. ° ° °

## 2015-06-11 ENCOUNTER — Encounter
Payer: Managed Care, Other (non HMO) | Attending: Physical Medicine & Rehabilitation | Admitting: Physical Medicine & Rehabilitation

## 2015-06-11 ENCOUNTER — Other Ambulatory Visit (INDEPENDENT_AMBULATORY_CARE_PROVIDER_SITE_OTHER): Payer: Managed Care, Other (non HMO)

## 2015-06-11 ENCOUNTER — Telehealth: Payer: Self-pay

## 2015-06-11 ENCOUNTER — Encounter: Payer: Self-pay | Admitting: Physical Medicine & Rehabilitation

## 2015-06-11 VITALS — BP 117/63 | HR 71

## 2015-06-11 DIAGNOSIS — D696 Thrombocytopenia, unspecified: Secondary | ICD-10-CM | POA: Diagnosis not present

## 2015-06-11 DIAGNOSIS — G061 Intraspinal abscess and granuloma: Secondary | ICD-10-CM | POA: Insufficient documentation

## 2015-06-11 DIAGNOSIS — R188 Other ascites: Principal | ICD-10-CM

## 2015-06-11 DIAGNOSIS — K7290 Hepatic failure, unspecified without coma: Secondary | ICD-10-CM | POA: Diagnosis not present

## 2015-06-11 DIAGNOSIS — D126 Benign neoplasm of colon, unspecified: Secondary | ICD-10-CM | POA: Diagnosis not present

## 2015-06-11 DIAGNOSIS — Z87442 Personal history of urinary calculi: Secondary | ICD-10-CM | POA: Insufficient documentation

## 2015-06-11 DIAGNOSIS — E669 Obesity, unspecified: Secondary | ICD-10-CM | POA: Insufficient documentation

## 2015-06-11 DIAGNOSIS — K210 Gastro-esophageal reflux disease with esophagitis: Secondary | ICD-10-CM | POA: Diagnosis not present

## 2015-06-11 DIAGNOSIS — K5730 Diverticulosis of large intestine without perforation or abscess without bleeding: Secondary | ICD-10-CM | POA: Diagnosis not present

## 2015-06-11 DIAGNOSIS — K7581 Nonalcoholic steatohepatitis (NASH): Secondary | ICD-10-CM | POA: Diagnosis not present

## 2015-06-11 DIAGNOSIS — I8500 Esophageal varices without bleeding: Secondary | ICD-10-CM | POA: Insufficient documentation

## 2015-06-11 DIAGNOSIS — K7682 Hepatic encephalopathy: Secondary | ICD-10-CM

## 2015-06-11 DIAGNOSIS — G959 Disease of spinal cord, unspecified: Secondary | ICD-10-CM | POA: Insufficient documentation

## 2015-06-11 DIAGNOSIS — K7460 Unspecified cirrhosis of liver: Secondary | ICD-10-CM

## 2015-06-11 DIAGNOSIS — E119 Type 2 diabetes mellitus without complications: Secondary | ICD-10-CM | POA: Diagnosis not present

## 2015-06-11 DIAGNOSIS — M5412 Radiculopathy, cervical region: Secondary | ICD-10-CM | POA: Diagnosis not present

## 2015-06-11 DIAGNOSIS — I10 Essential (primary) hypertension: Secondary | ICD-10-CM | POA: Insufficient documentation

## 2015-06-11 LAB — BASIC METABOLIC PANEL
BUN: 28 mg/dL — ABNORMAL HIGH (ref 6–23)
CALCIUM: 9.6 mg/dL (ref 8.4–10.5)
CO2: 27 mEq/L (ref 19–32)
CREATININE: 1.51 mg/dL — AB (ref 0.40–1.50)
Chloride: 93 mEq/L — ABNORMAL LOW (ref 96–112)
GFR: 49.89 mL/min — AB (ref >60.00)
Glucose, Bld: 516 mg/dL (ref 70–99)
Potassium: 5 mEq/L (ref 3.5–5.1)
Sodium: 128 mEq/L — ABNORMAL LOW (ref 135–145)

## 2015-06-11 MED ORDER — BACLOFEN 20 MG PO TABS: 20.0000 mg | ORAL_TABLET | Freq: Every day | ORAL | Status: DC

## 2015-06-11 NOTE — Telephone Encounter (Signed)
Pt aware.

## 2015-06-11 NOTE — Progress Notes (Signed)
Subjective:    Patient ID: [name], male    DOB: 1951/03/07, 64 y.o.   MRN: [id number]  HPI   [name] is here in follow up of his cervical epidural abscess. He walks to the mailbox and walks the dog occasionally. He is still having leg cramps despite the baclofen. The baclofen may have helped slightly.\  He had a follow up MRI which showed some continued cord edema which is felt to be potentially due to venous thrombosis.   He continues with cirrhosis mgt per GI. He is requiring regular paracenteses. He feels tired many days from his high ammonia levels as well as his sleep which is often affected by cramps.     Pain Inventory Average Pain 7 Pain Right Now 5 My pain is tingling and aching  In the last 24 hours, has pain interfered with the following? General activity 7 Relation with others 7 Enjoyment of life 8 What TIME of day is your pain at its worst? night Sleep (in general) Poor  Pain is worse with: walking and some activites Pain improves with: rest and medication Relief from Meds: 6  Mobility walk with assistance use a cane how many minutes can you walk? 5 ability to climb steps?  yes do you drive?  no  Function disabled: date disabled 10/08/2014 I need assistance with the following:  meal prep, household duties and shopping  Neuro/Psych bladder control problems bowel control problems weakness numbness tremor tingling trouble walking spasms dizziness confusion depression anxiety  Prior Studies Any changes since last visit?  yes  Physicians involved in your care Any changes since last visit?  yes   Family History  Problem Relation Age of Onset  . Colon cancer Neg Hx   . Anesthesia problems Neg Hx   . Hypotension Neg Hx   . Malignant hyperthermia Neg Hx   . Pseudochol deficiency Neg Hx   . Diabetes Father   . Heart disease Father   . Aneurysm Mother   . Heart disease Other     neice  . Diabetes      niece   Social History    Social History  . Marital Status: Married    Spouse Name: N/A  . Number of Children: 1  . Years of Education: 13   Occupational History  . retired    Social History Main Topics  . Smoking status: Never Smoker   . Smokeless tobacco: Never Used  . Alcohol Use: No     Comment: very rare  . Drug Use: No  . Sexual Activity: No   Other Topics Concern  . None   Social History Narrative   Patient drinks caffeine 1-2 times per week.   Patient is right handed.    Past Surgical History  Procedure Laterality Date  . Kidney stone surgery  yrs ago  . Esophagogastroduodenoscopy  08/01/2011    Procedure: ESOPHAGOGASTRODUODENOSCOPY (EGD);  Surgeon: Scarlette Shorts, MD;  Location: [facility];  Service: Endoscopy;  Laterality: N/A;  . Esophagogastroduodenoscopy N/A 10/27/2012    Procedure: ESOPHAGOGASTRODUODENOSCOPY (EGD);  Surgeon: Inda Castle, MD;  Location: [facility];  Service: Endoscopy;  Laterality: N/A;  . Esophagogastroduodenoscopy N/A 11/22/2012    Procedure: ESOPHAGOGASTRODUODENOSCOPY (EGD);  Surgeon: Irene Shipper, MD;  Location: [facility];  Service: Endoscopy;  Laterality: N/A;  . Colonoscopy    . Upper gastrointestinal endoscopy    . Esophagogastroduodenoscopy N/A 11/29/2013    Procedure: ESOPHAGOGASTRODUODENOSCOPY (EGD);  Surgeon: Irene Shipper, MD;  Location:  [facility];  Service: Endoscopy;  Laterality: N/A;  . Esophageal banding Bilateral 11/29/2013    Procedure: ESOPHAGEAL BANDING;  Surgeon: Irene Shipper, MD;  Location: [facility];  Service: Endoscopy;  Laterality: Bilateral;  . Esophagogastroduodenoscopy N/A 06/13/2014    Procedure: ESOPHAGOGASTRODUODENOSCOPY (EGD);  Surgeon: Irene Shipper, MD;  Location: [facility];  Service: Endoscopy;  Laterality: N/A;  . Esophageal banding N/A 06/13/2014    Procedure: ESOPHAGEAL BANDING;  Surgeon: Irene Shipper, MD;  Location: [facility];  Service: Endoscopy;  Laterality: N/A;   Past Medical History  Diagnosis Date  . Reflux  esophagitis   . Portal hypertension ([place])   . Stricture and stenosis of esophagus   . Diverticulosis of colon (without mention of hemorrhage)   . Internal hemorrhoids without mention of complication   . Benign neoplasm of colon   . Duodenitis without mention of hemorrhage   . 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 Personal history of diseases of blood and blood-forming organs   . 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   Marland Kitchen Unspecified essential hypertension   . Obesity, unspecified   . Type II or unspecified type diabetes mellitus without mention of complication, not stated as uncontrolled   . GERD (gastroesophageal reflux disease)   . Hemorrhoids   . Colon polyps 2004, 2006, 2008, 11/2011    Adenomatous and tubulovillous adenoma.  . Pancytopenia   . Calculus of kidney yrs ago  . Ascites    BP 117/63 mmHg  Pulse 71  SpO2 97%  Opioid Risk Score:   Fall Risk Score:  `1  Depression screen PHQ 2/9  Depression screen [facility] 2/9 04/11/2015 12/05/2014  Decreased Interest 3 0  Down, Depressed, Hopeless 1 0  PHQ - 2 Score 4 0  Altered sleeping 1 -  Tired, decreased energy 3 -  Change in appetite 2 -  Feeling bad or failure about yourself  1 -  Trouble concentrating 2 -  Moving slowly or fidgety/restless 2 -  Suicidal thoughts 0 -  PHQ-9 Score 15 -  Difficult doing work/chores Very difficult -     Review of Systems  Gastrointestinal:       Bowel Control Problems  Genitourinary:       Bladder control problems  Musculoskeletal: Positive for gait problem.       Spasms  Neurological: Positive for dizziness, tremors, weakness and numbness.       Tingling  Psychiatric/Behavioral: Positive for confusion. The patient is nervous/anxious.        Depression  All other systems reviewed and are negative.      Objective:   Physical Exam  Constitutional: He is oriented to person, place, and time. He appears well-developed and well-nourished.    HENT:  Head: Normocephalic and atraumatic.  Eyes: Conjunctivae are normal. Pupils are equal, round, and reactive to light.  Neck: Muscular tenderness present. Decreased range of motion present.  Cardiovascular: Normal rate and regular rhythm.  Respiratory: Effort normal and diminished basilar breath sounds . No respiratory distress. bilateral wheezes.  GI: Abdomen softer but sill with ongoing ascites/ distention. Bowel sounds are improved. There is no tenderness.  Musculoskeletal: He exhibits edema (1+ edema pedally). Minimal scrotal edema .  Neck pain with ROM bilateral hips and LUE.  Neurological: He is alert and oriented to person, place, and time. LT seems intact.  . RUE 4+/5. L- deltoid 4+/5, 4+ biceps, 4+ triceps, intrinsics 4+/5. BLE 4+ RLE, 4+/5  LLE. Still favors left leg during gait. Did not lose balance. DTR'sare 3+. MILD LT and pain loss in left arm and leg. DTR's 1+ . He walks with a narrow, short stride. He tends to shuffle somewhat and lean forwards.  Skin: Skin is warm and dry. Healing scar on left patella---appears to have blood undewrneath   Psychiatric: His speech is normal. He's alert  Assessment/Plan:  1. 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 Lower extremity cramps:  -increase baclofen to 20mg  qhs---need to re-establish sleep -stretching daily -continue strengthening exercises    4. Mood: provide ego support.  5. 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 Thrombocytopenia: chronic  7. DM type 2: per primary  -gabapentin for ?neruopathic pain.   Follow up with me in 3 months. Fifteen minutes of face to face patient care time were spent during this visit. All questions were encouraged and answered.  . All questions were encouraged and answered.

## 2015-06-11 NOTE — Telephone Encounter (Signed)
See if he took his meds at all but try to have him see his PCP about this today if possible - or at least get advice from them about med changes and if having sxs related to the high sugar then go to urgent care or ED

## 2015-06-11 NOTE — Telephone Encounter (Signed)
-----   Message from Algernon Huxley, RN sent at 05/29/2015  4:48 PM EST ----- Regarding: FW: Labs   ----- Message -----    From: Algernon Huxley, RN    Sent: 05/29/2015   4:47 PM      To: Algernon Huxley, RN Subject: Labs                                           Pt needs bmet in 2 weeks, orders in epic.

## 2015-06-11 NOTE — Patient Instructions (Signed)
PLEASE CALL ME WITH ANY PROBLEMS OR QUESTIONS [id number]). HAVE A HAPPY HOLIDAY SEASON!!!   TRY 15MG  OF BACLOFEN FIRST----INCREASE TO 20MG  AT NIGHT AFTER 3 DAYS IF INEFFECTIVE.

## 2015-06-11 NOTE — Telephone Encounter (Signed)
Spoke with pts wife and she does not think he took his meds today. Instructed them to call his PCP to discuss meds and if they wanted him to make any changes. She was instructed to call now, she verbalized understanding.

## 2015-06-11 NOTE — Telephone Encounter (Signed)
[name] pt, history of cirrhosis. Received call from lab. Pt has critical glucose of 516. Please advise as doc of the day.

## 2015-06-12 ENCOUNTER — Other Ambulatory Visit: Payer: Self-pay

## 2015-06-12 DIAGNOSIS — K7469 Other cirrhosis of liver: Secondary | ICD-10-CM

## 2015-06-18 ENCOUNTER — Encounter: Payer: Self-pay | Admitting: Internal Medicine

## 2015-06-18 ENCOUNTER — Ambulatory Visit (INDEPENDENT_AMBULATORY_CARE_PROVIDER_SITE_OTHER): Payer: Managed Care, Other (non HMO) | Admitting: Internal Medicine

## 2015-06-18 ENCOUNTER — Telehealth: Payer: Self-pay | Admitting: Internal Medicine

## 2015-06-18 VITALS — BP 102/68 | HR 76 | Ht 70.0 in | Wt 186.8 lb

## 2015-06-18 DIAGNOSIS — R188 Other ascites: Secondary | ICD-10-CM

## 2015-06-18 DIAGNOSIS — Z23 Encounter for immunization: Secondary | ICD-10-CM | POA: Diagnosis not present

## 2015-06-18 MED ORDER — LACTULOSE 10 GM/15ML PO SOLN: ORAL | Status: DC

## 2015-06-18 NOTE — Telephone Encounter (Signed)
[name] from [employer] benefits called in regarding this.   Prior auth of medical necessity needs to be sent in for this medication. P: [id number].

## 2015-06-18 NOTE — Patient Instructions (Signed)
We have sent medications to your pharmacy for you to pick up at your convenience.  You have been scheduled for an abdominal ultrasound at [facility] (1st floor of hospital) on 06/26/2015 at 11:00am. Please arrive 15 minutes prior to your appointment for registration. Make certain not to have anything to eat or drink 6 hours prior to your appointment. Should you need to reschedule your appointment, please contact radiology at [phone]. This test typically takes about 30 minutes to perform.

## 2015-06-18 NOTE — Progress Notes (Signed)
HISTORY OF PRESENT ILLNESS:  [name] is a 64 y.o. 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 Complications of his liver disease have included variceal bleeding, encephalopathy, and ascites. He is also followed at the [facility] liver clinic. 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 He was last seen in this office 05/14/2015. He continues on diuretics (Aldactone 200 mg daily and Lasix 60 mg daily) and medication for his hepatic encephalopathy (lactulose, Xifaxan). He presents today for follow-up. He is accompanied by his wife. He has undergone interval paracentesis. This past weekend he went to his local hospital emergency room for nausea and vomiting. He was diagnosed with constipation. Given an enema. It does not appear that he has been using lactulose as prescribed to achieve 3-5 bowel movements daily. Actually, can go a day or 2 without a bowel movement. We have gone over this with them repeatedly. He is accompanied today by his wife. His chief complaint today is fatigue. He has had transient somnolence but no full-blown encephalopathy. No evidence for GI bleeding. Ascites has reaccumulated, though non-tense. His weight is up a few pounds from last visit. He is interested in paracentesis next week before the holidays. Blood work from December 5 was stable though blood sugar was greater then 500. He has address this with his PCP. He has upcoming appointments with infectious diseases and [facility]. Recently saw his neurosurgeon  REVIEW OF SYSTEMS:  All non-GI ROS negative except for fatigue, ankle swelling,  Past Medical History  Diagnosis Date  . Reflux esophagitis   . Portal hypertension ([place])   . Stricture and stenosis of esophagus   . Diverticulosis of colon (without mention of hemorrhage)   . Internal  hemorrhoids without mention of complication   . Benign neoplasm of colon   . Duodenitis without mention of hemorrhage   . 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 Personal history of diseases of blood and blood-forming organs   . 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   Marland Kitchen Unspecified essential hypertension   . Obesity, unspecified   . Type II or unspecified type diabetes mellitus without mention of complication, not stated as uncontrolled   . GERD (gastroesophageal reflux disease)   . Hemorrhoids   . Colon polyps 2004, 2006, 2008, 11/2011    Adenomatous and tubulovillous adenoma.  . Pancytopenia   . Calculus of kidney yrs ago  . Ascites     Past Surgical History  Procedure Laterality Date  . Kidney stone surgery  yrs ago  . Esophagogastroduodenoscopy  08/01/2011    Procedure: ESOPHAGOGASTRODUODENOSCOPY (EGD);  Surgeon: Scarlette Shorts, MD;  Location: [facility];  Service: Endoscopy;  Laterality: N/A;  . Esophagogastroduodenoscopy N/A 10/27/2012    Procedure: ESOPHAGOGASTRODUODENOSCOPY (EGD);  Surgeon: Inda Castle, MD;  Location: [facility];  Service: Endoscopy;  Laterality: N/A;  . Esophagogastroduodenoscopy N/A 11/22/2012    Procedure: ESOPHAGOGASTRODUODENOSCOPY (EGD);  Surgeon: Irene Shipper, MD;  Location: [facility];  Service: Endoscopy;  Laterality: N/A;  . Colonoscopy    . Upper gastrointestinal endoscopy    . Esophagogastroduodenoscopy N/A 11/29/2013    Procedure: ESOPHAGOGASTRODUODENOSCOPY (EGD);  Surgeon: Irene Shipper, MD;  Location: [facility];  Service: Endoscopy;  Laterality: N/A;  . Esophageal banding Bilateral 11/29/2013    Procedure: ESOPHAGEAL BANDING;  Surgeon: Irene Shipper, MD;  Location: [facility];  Service: Endoscopy;  Laterality: Bilateral;  . Esophagogastroduodenoscopy N/A 06/13/2014    Procedure: ESOPHAGOGASTRODUODENOSCOPY (EGD);  Surgeon: Irene Shipper, MD;  Location: [facility];  Service: Endoscopy;  Laterality: N/A;   . Esophageal banding N/A 06/13/2014    Procedure: ESOPHAGEAL BANDING;  Surgeon: Irene Shipper, MD;  Location: [facility];  Service: Endoscopy;  Laterality: N/A;    Social History [name]  reports that he has never smoked. He has never used smokeless tobacco. He reports that he does not drink alcohol or use illicit drugs.  family history includes Aneurysm in his mother; Diabetes in his father and another family member; Heart disease in his father and other. There is no history of Colon cancer, Anesthesia problems, Hypotension, Malignant hyperthermia, or Pseudochol deficiency.  No Known Allergies     PHYSICAL EXAMINATION: Vital signs: BP 102/68 mmHg  Pulse 76  Ht 5\' 10"  (1.778 m)  Wt 186 lb 12.8 oz (84.732 kg)  BMI 26.80 kg/m2  Constitutional: Pale, chronically ill-appearing, no acute distress Psychiatric: Fatigued appearing but alert and oriented x3, cooperative Eyes: extraocular movements intact, anicteric, conjunctiva pink Mouth: oral pharynx moist, no lesions Neck: supple without thyromegaly Lymph: no lymphadenopathy Cardiovascular: heart regular rate and rhythm, no murmur Lungs: clear to auscultation bilaterally Abdomen: soft, nontender, nondistended, with non-tense ascites, no peritoneal signs, normal bowel sounds, no organomegaly Rectal: Omitted Extremities: no clubbing or cyanosis. Trace lower extremity edema bilaterally Skin: no lesions on visible extremities. He does have ecchymoses on the abdomen from insulin injection Neuro: No focal deficits. No asterixis.   ASSESSMENT:  #1. NASH cirrhosis with portal hypertension and ongoing issues with ascites and intermittent encephalopathy.. History of bleeding esophageal varices last banded in May 2015 with insignificant varices on relook December 2015 #2. History of cervical epidural abscess with transient tetraplegia. Continues on antibiotics. Continues to be followed by ID and neurosurgery #3. GERD. On PPI #4.  History of adenomatous colon polyps. Last colonoscopy September 2013. Was due for surveillance around September 2016, but postponed due to other critical issues and overall poor health at present  PLAN:  #1. Continue diuretics at current dose #2. 2 g sodium diet #3. Measured daily weight and record #4. Emphasized to titrate lactulose to achieve 3-5 bowel movements daily. Prescription rewritten #5. Schedule ultrasound-guided paracentesis (large-volume) with albumin replacement next week #6. Keep upcoming appointment with [facility] #7. Routine GI follow-up in this office in 2 months #8. Twinrix vaccination series completed today  45 minutes was spent with the patient face-to-face. Greater than 50% of the time use for counseling he and his wife regarding his multiple GI issues and the proper treatment recommendations

## 2015-06-25 ENCOUNTER — Telehealth: Payer: Self-pay

## 2015-06-25 NOTE — Telephone Encounter (Signed)
Pt states he will come tomorrow for labs.

## 2015-06-25 NOTE — Telephone Encounter (Signed)
-----   Message from Algernon Huxley, RN sent at 06/12/2015 10:31 AM EST ----- Regarding: BMET Pt needs bmet in 2 weeks

## 2015-06-26 ENCOUNTER — Ambulatory Visit (HOSPITAL_COMMUNITY)
Admission: RE | Admit: 2015-06-26 | Discharge: 2015-06-26 | Disposition: A | Discharge status: Home / Self Care | Payer: Managed Care, Other (non HMO) | Source: Ambulatory Visit | Attending: Internal Medicine | Admitting: Internal Medicine

## 2015-06-26 ENCOUNTER — Other Ambulatory Visit (INDEPENDENT_AMBULATORY_CARE_PROVIDER_SITE_OTHER): Payer: Managed Care, Other (non HMO)

## 2015-06-26 ENCOUNTER — Encounter (HOSPITAL_COMMUNITY): Payer: Self-pay

## 2015-06-26 ENCOUNTER — Encounter: Payer: Self-pay | Admitting: Internal Medicine

## 2015-06-26 ENCOUNTER — Encounter (HOSPITAL_COMMUNITY)
Admission: RE | Admit: 2015-06-26 | Discharge: 2015-06-26 | Disposition: A | Discharge status: Home / Self Care | Payer: Managed Care, Other (non HMO) | Source: Ambulatory Visit | Attending: Internal Medicine | Admitting: Internal Medicine

## 2015-06-26 DIAGNOSIS — K7469 Other cirrhosis of liver: Secondary | ICD-10-CM | POA: Diagnosis not present

## 2015-06-26 DIAGNOSIS — K7460 Unspecified cirrhosis of liver: Secondary | ICD-10-CM | POA: Insufficient documentation

## 2015-06-26 DIAGNOSIS — Z23 Encounter for immunization: Secondary | ICD-10-CM

## 2015-06-26 DIAGNOSIS — R188 Other ascites: Secondary | ICD-10-CM | POA: Diagnosis present

## 2015-06-26 LAB — BODY FLUID CELL COUNT WITH DIFFERENTIAL
EOS FL: 0 %
Lymphs, Fluid: 24 %
Monocyte-Macrophage-Serous Fluid: 76 % (ref 50–90)
NEUTROPHIL FLUID: 0 % (ref 0–25)
WBC FLUID: 141 uL (ref 0–1000)

## 2015-06-26 LAB — BASIC METABOLIC PANEL
BUN: 28 mg/dL — AB (ref 6–23)
CHLORIDE: 97 meq/L (ref 96–112)
CO2: 27 meq/L (ref 19–32)
CREATININE: 1.59 mg/dL — AB (ref 0.40–1.50)
Calcium: 10 mg/dL (ref 8.4–10.5)
GFR: 47 mL/min — ABNORMAL LOW (ref >60.00)
Glucose, Bld: 115 mg/dL — ABNORMAL HIGH (ref 70–99)
POTASSIUM: 4.1 meq/L (ref 3.5–5.1)
Sodium: 133 mEq/L — ABNORMAL LOW (ref 135–145)

## 2015-06-26 MED ORDER — ALBUMIN HUMAN 25 % IV SOLN
62.5000 g | Freq: Once | INTRAVENOUS | Status: AC
  Administered 2015-06-26: 62.5 g via INTRAVENOUS
  Filled 2015-06-26: qty 300
  Filled 2015-06-26: qty 250

## 2015-06-26 MED ORDER — SODIUM CHLORIDE 0.9 % IV SOLN
INTRAVENOUS | Status: DC
  Administered 2015-06-26: 13:00:00 via INTRAVENOUS

## 2015-06-26 NOTE — Procedures (Signed)
Successful US guided paracentesis from [facility].  Yielded 8L of serosnaguinous fluid.  No immediate complications.  Pt tolerated well.   Specimen was sent for labs.  Ascencion Dike PA-C 06/26/2015 12:13 PM

## 2015-06-26 NOTE — Discharge Instructions (Signed)
Albumin injection °What is this medicine? °ALBUMIN (al BYOO min) is used to treat or prevent shock following serious injury, bleeding, surgery, or burns by increasing the volume of blood plasma. This medicine can also replace low blood protein. °This medicine may be used for other purposes; ask your health care provider or pharmacist if you have questions. °What should I tell my health care provider before I take this medicine? °They need to know if you have any of the following conditions: °-anemia °-heart disease °-kidney disease °-an unusual or allergic reaction to albumin, other medicines, foods, dyes, or preservatives °-pregnant or trying to get pregnant °-breast-feeding °How should I use this medicine? °This medicine is for infusion into a vein. It is given by a health-care professional in a hospital or clinic. °Talk to your pediatrician regarding the use of this medicine in children. While this drug may be prescribed for selected conditions, precautions do apply. °Overdosage: If you think you have taken too much of this medicine contact a poison control center or emergency room at once. °NOTE: This medicine is only for you. Do not share this medicine with others. °What if I miss a dose? °This does not apply. °What may interact with this medicine? °Interactions are not expected. °This list may not describe all possible interactions. Give your health care provider a list of all the medicines, herbs, non-prescription drugs, or dietary supplements you use. Also tell them if you smoke, drink alcohol, or use illegal drugs. Some items may interact with your medicine. °What should I watch for while using this medicine? °Your condition will be closely monitored while you receive this medicine. °Some products are derived from human plasma, and there is a small risk that these products may contain certain types of virus or bacteria. All products are processed to kill most viruses and bacteria. If you have questions  concerning the risk of infections, discuss them with your doctor or health care professional. °What side effects may I notice from receiving this medicine? °Side effects that you should report to your doctor or health care professional as soon as possible: °-allergic reactions like skin rash, itching or hives, swelling of the face, lips, or tongue °-breathing problems °-changes in heartbeat °-fever, chills °-pain, redness or swelling at the injection site °-signs of viral infection including fever, drowsiness, chills, runny nose followed in about 2 weeks by a rash and joint pain °-tightness in the chest °Side effects that usually do not require medical attention (report to your doctor or health care professional if they continue or are bothersome): °-increased salivation °-nausea, vomiting °This list may not describe all possible side effects. Call your doctor for medical advice about side effects. You may report side effects to FDA at 1-800-FDA-1088. °Where should I keep my medicine? °This does not apply. You will not be given this medicine to store at home. °NOTE: This sheet is a summary. It may not cover all possible information. If you have questions about this medicine, talk to your doctor, pharmacist, or health care provider. °  °© 2016, [employer]. (2007-09-16 10:18:55) ° °

## 2015-06-27 ENCOUNTER — Other Ambulatory Visit: Payer: Self-pay

## 2015-06-27 ENCOUNTER — Other Ambulatory Visit: Payer: Self-pay | Admitting: Internal Medicine

## 2015-06-27 DIAGNOSIS — K7469 Other cirrhosis of liver: Secondary | ICD-10-CM

## 2015-06-27 LAB — PATHOLOGIST SMEAR REVIEW

## 2015-06-27 NOTE — Telephone Encounter (Signed)
Submitted prior authorization for Lactulose to Cover my Meds

## 2015-07-03 ENCOUNTER — Other Ambulatory Visit: Payer: Self-pay

## 2015-07-03 ENCOUNTER — Telehealth: Payer: Self-pay | Admitting: Internal Medicine

## 2015-07-03 DIAGNOSIS — R188 Other ascites: Secondary | ICD-10-CM

## 2015-07-03 MED ORDER — ALBUMIN HUMAN 25 % IV SOLN: 8.0000 g | Freq: Once | INTRAVENOUS | Status: DC

## 2015-07-03 NOTE — Telephone Encounter (Signed)
This patient is NASH. Denies any missed medications or changes in dosing. He thinks he has gained about 10 lbs since the paracentesis 06/26/15. He cannot bend at the waist. He is due labs again in one week.

## 2015-07-03 NOTE — Telephone Encounter (Signed)
Thank you :)

## 2015-07-03 NOTE — Telephone Encounter (Signed)
Please confirm current diuretics and the dosages. Set up a large volume paracentesis, up to 8 L, with albumin replacement. Send the fluid for cell count with differential. Thank you

## 2015-07-03 NOTE — Telephone Encounter (Signed)
Scheduled for 07/06/15 arrive at 9:45 am at [facility]. Per spouse, the patient takes lasix 20 mg three times daily, Aldactone 100 mg twice daily.

## 2015-07-04 NOTE — Telephone Encounter (Signed)
Received documentation that stated Lactulose would not need a prior authorization

## 2015-07-06 ENCOUNTER — Ambulatory Visit (HOSPITAL_COMMUNITY)
Admission: RE | Admit: 2015-07-06 | Discharge: 2015-07-06 | Disposition: A | Discharge status: Home / Self Care | Payer: Managed Care, Other (non HMO) | Source: Ambulatory Visit | Attending: Internal Medicine | Admitting: Internal Medicine

## 2015-07-06 ENCOUNTER — Encounter (HOSPITAL_COMMUNITY)
Admission: RE | Admit: 2015-07-06 | Discharge: 2015-07-06 | Disposition: A | Discharge status: Home / Self Care | Payer: Managed Care, Other (non HMO) | Source: Ambulatory Visit | Attending: Internal Medicine | Admitting: Internal Medicine

## 2015-07-06 ENCOUNTER — Telehealth: Payer: Self-pay

## 2015-07-06 DIAGNOSIS — R188 Other ascites: Secondary | ICD-10-CM | POA: Insufficient documentation

## 2015-07-06 DIAGNOSIS — K7460 Unspecified cirrhosis of liver: Secondary | ICD-10-CM | POA: Diagnosis not present

## 2015-07-06 LAB — BODY FLUID CELL COUNT WITH DIFFERENTIAL
Eos, Fluid: 0 %
LYMPHS FL: 27 %
Monocyte-Macrophage-Serous Fluid: 67 % (ref 50–90)
NEUTROPHIL FLUID: 6 % (ref 0–25)
Total Nucleated Cell Count, Fluid: 127 cu mm (ref 0–1000)

## 2015-07-06 MED ORDER — ALBUMIN HUMAN 25 % IV SOLN
62.5000 g | Freq: Once | INTRAVENOUS | Status: DC
  Filled 2015-07-06: qty 300

## 2015-07-06 MED ORDER — SODIUM CHLORIDE 0.9 % IV SOLN: Freq: Once | INTRAVENOUS | Status: DC

## 2015-07-06 MED ORDER — ALBUMIN HUMAN 25 % IV SOLN
50.0000 g | Freq: Once | INTRAVENOUS | Status: AC
  Administered 2015-07-06: 50 g via INTRAVENOUS
  Filled 2015-07-06: qty 200

## 2015-07-06 MED ORDER — SODIUM CHLORIDE 0.9 % IV SOLN
Freq: Once | INTRAVENOUS | Status: AC
  Administered 2015-07-06: 11:00:00 via INTRAVENOUS

## 2015-07-06 MED ORDER — ALBUMIN HUMAN 25 % IV SOLN
12.5000 g | Freq: Once | INTRAVENOUS | Status: AC
  Administered 2015-07-06: 12.5 g via INTRAVENOUS
  Filled 2015-07-06: qty 50

## 2015-07-06 NOTE — Telephone Encounter (Signed)
Spoke with [facility] who wanted clarification on amount of Albumin for pts paracentesis and volume of fluid to be removed.  Per [name], up to 8 L removed.  Albumin amount was not mentioned in the phone note I reviewed so per Barb Merino - 8 g per liter removed.  The tech acknowledged and understood.

## 2015-07-06 NOTE — Procedures (Signed)
Successful US guided paracentesis from [facility].  Yielded 8 liters of serosanguinous fluid.  No immediate complications.  Pt tolerated well.  Post procedure IV Albumin was ordered.  Specimen was sent for labs.  Tsosie Billing D PA-C 07/06/2015 11:12 AM

## 2015-07-08 ENCOUNTER — Other Ambulatory Visit: Payer: Self-pay | Admitting: Physical Medicine & Rehabilitation

## 2015-07-10 ENCOUNTER — Telehealth: Payer: Self-pay | Admitting: Internal Medicine

## 2015-07-10 ENCOUNTER — Telehealth: Payer: Self-pay

## 2015-07-10 ENCOUNTER — Emergency Department (HOSPITAL_COMMUNITY): Payer: Managed Care, Other (non HMO)

## 2015-07-10 ENCOUNTER — Encounter (HOSPITAL_COMMUNITY): Payer: Self-pay | Admitting: Emergency Medicine

## 2015-07-10 ENCOUNTER — Inpatient Hospital Stay (HOSPITAL_COMMUNITY)
Admission: EM | Admit: 2015-07-10 | Discharge: 2015-07-13 | DRG: 441 | Disposition: A | Discharge status: Home Health | Payer: Managed Care, Other (non HMO) | Attending: Internal Medicine | Admitting: Internal Medicine

## 2015-07-10 DIAGNOSIS — F329 Major depressive disorder, single episode, unspecified: Secondary | ICD-10-CM | POA: Diagnosis present

## 2015-07-10 DIAGNOSIS — G9341 Metabolic encephalopathy: Secondary | ICD-10-CM | POA: Diagnosis present

## 2015-07-10 DIAGNOSIS — E860 Dehydration: Secondary | ICD-10-CM | POA: Diagnosis present

## 2015-07-10 DIAGNOSIS — E1121 Type 2 diabetes mellitus with diabetic nephropathy: Secondary | ICD-10-CM | POA: Diagnosis not present

## 2015-07-10 DIAGNOSIS — Z6824 Body mass index (BMI) 24.0-24.9, adult: Secondary | ICD-10-CM | POA: Diagnosis not present

## 2015-07-10 DIAGNOSIS — Z8661 Personal history of infections of the central nervous system: Secondary | ICD-10-CM | POA: Diagnosis not present

## 2015-07-10 DIAGNOSIS — K7581 Nonalcoholic steatohepatitis (NASH): Secondary | ICD-10-CM | POA: Diagnosis present

## 2015-07-10 DIAGNOSIS — K766 Portal hypertension: Secondary | ICD-10-CM | POA: Diagnosis present

## 2015-07-10 DIAGNOSIS — D638 Anemia in other chronic diseases classified elsewhere: Secondary | ICD-10-CM | POA: Diagnosis present

## 2015-07-10 DIAGNOSIS — Z79899 Other long term (current) drug therapy: Secondary | ICD-10-CM

## 2015-07-10 DIAGNOSIS — Z794 Long term (current) use of insulin: Secondary | ICD-10-CM | POA: Diagnosis not present

## 2015-07-10 DIAGNOSIS — E875 Hyperkalemia: Secondary | ICD-10-CM | POA: Diagnosis present

## 2015-07-10 DIAGNOSIS — E1122 Type 2 diabetes mellitus with diabetic chronic kidney disease: Secondary | ICD-10-CM | POA: Diagnosis present

## 2015-07-10 DIAGNOSIS — K7290 Hepatic failure, unspecified without coma: Secondary | ICD-10-CM | POA: Diagnosis not present

## 2015-07-10 DIAGNOSIS — I129 Hypertensive chronic kidney disease with stage 1 through stage 4 chronic kidney disease, or unspecified chronic kidney disease: Secondary | ICD-10-CM | POA: Diagnosis present

## 2015-07-10 DIAGNOSIS — N179 Acute kidney failure, unspecified: Secondary | ICD-10-CM | POA: Diagnosis not present

## 2015-07-10 DIAGNOSIS — Z792 Long term (current) use of antibiotics: Secondary | ICD-10-CM

## 2015-07-10 DIAGNOSIS — D696 Thrombocytopenia, unspecified: Secondary | ICD-10-CM | POA: Diagnosis not present

## 2015-07-10 DIAGNOSIS — K7460 Unspecified cirrhosis of liver: Secondary | ICD-10-CM | POA: Diagnosis present

## 2015-07-10 DIAGNOSIS — D691 Qualitative platelet defects: Secondary | ICD-10-CM

## 2015-07-10 DIAGNOSIS — E11649 Type 2 diabetes mellitus with hypoglycemia without coma: Secondary | ICD-10-CM | POA: Diagnosis present

## 2015-07-10 DIAGNOSIS — R188 Other ascites: Secondary | ICD-10-CM | POA: Diagnosis present

## 2015-07-10 DIAGNOSIS — E1140 Type 2 diabetes mellitus with diabetic neuropathy, unspecified: Secondary | ICD-10-CM | POA: Diagnosis present

## 2015-07-10 DIAGNOSIS — N184 Chronic kidney disease, stage 4 (severe): Secondary | ICD-10-CM | POA: Diagnosis present

## 2015-07-10 DIAGNOSIS — E1165 Type 2 diabetes mellitus with hyperglycemia: Secondary | ICD-10-CM | POA: Diagnosis present

## 2015-07-10 DIAGNOSIS — Z9119 Patient's noncompliance with other medical treatment and regimen: Secondary | ICD-10-CM | POA: Diagnosis not present

## 2015-07-10 DIAGNOSIS — B951 Streptococcus, group B, as the cause of diseases classified elsewhere: Secondary | ICD-10-CM | POA: Diagnosis not present

## 2015-07-10 DIAGNOSIS — K219 Gastro-esophageal reflux disease without esophagitis: Secondary | ICD-10-CM | POA: Diagnosis present

## 2015-07-10 DIAGNOSIS — E7220 Disorder of urea cycle metabolism, unspecified: Secondary | ICD-10-CM

## 2015-07-10 DIAGNOSIS — R4182 Altered mental status, unspecified: Secondary | ICD-10-CM

## 2015-07-10 DIAGNOSIS — D61818 Other pancytopenia: Secondary | ICD-10-CM | POA: Diagnosis present

## 2015-07-10 LAB — CBC WITH DIFFERENTIAL/PLATELET
BASOS ABS: 0 10*3/uL (ref 0.0–0.1)
Basophils Relative: 0 %
EOS ABS: 0 10*3/uL (ref 0.0–0.7)
Eosinophils Relative: 1 %
HCT: 32 % — ABNORMAL LOW (ref 39.0–52.0)
Hemoglobin: 10.9 g/dL — ABNORMAL LOW (ref 13.0–17.0)
LYMPHS ABS: 0.8 10*3/uL (ref 0.7–4.0)
Lymphocytes Relative: 20 %
MCH: 30.8 pg (ref 26.0–34.0)
MCHC: 34.1 g/dL (ref 30.0–36.0)
MCV: 90.4 fL (ref 78.0–100.0)
MONO ABS: 0 10*3/uL — AB (ref 0.1–1.0)
Monocytes Relative: 1 %
NEUTROS PCT: 78 %
Neutro Abs: 3 10*3/uL (ref 1.7–7.7)
PLATELETS: 28 10*3/uL — AB (ref 150–400)
RBC: 3.54 MIL/uL — AB (ref 4.22–5.81)
RDW: 15.5 % (ref 11.5–15.5)
WBC: 3.8 10*3/uL — AB (ref 4.0–10.5)

## 2015-07-10 LAB — URINALYSIS, ROUTINE W REFLEX MICROSCOPIC
BILIRUBIN URINE: NEGATIVE
Glucose, UA: NEGATIVE mg/dL
Hgb urine dipstick: NEGATIVE
KETONES UR: NEGATIVE mg/dL
LEUKOCYTES UA: NEGATIVE
NITRITE: NEGATIVE
PH: 5.5 (ref 5.0–8.0)
PROTEIN: NEGATIVE mg/dL
Specific Gravity, Urine: 1.021 (ref 1.005–1.030)

## 2015-07-10 LAB — COMPREHENSIVE METABOLIC PANEL
ALT: 23 U/L (ref 17–63)
AST: 31 U/L (ref 15–41)
Albumin: 3.6 g/dL (ref 3.5–5.0)
Alkaline Phosphatase: 71 U/L (ref 38–126)
Anion gap: 12 (ref 5–15)
BILIRUBIN TOTAL: 2.3 mg/dL — AB (ref 0.3–1.2)
BUN: 42 mg/dL — AB (ref 6–20)
CALCIUM: 9.5 mg/dL (ref 8.9–10.3)
CHLORIDE: 101 mmol/L (ref 101–111)
CO2: 22 mmol/L (ref 22–32)
CREATININE: 2.21 mg/dL — AB (ref 0.61–1.24)
GFR, EST AFRICAN AMERICAN: 35 mL/min — AB (ref >60)
GFR, EST NON AFRICAN AMERICAN: 30 mL/min — AB (ref >60)
Glucose, Bld: 113 mg/dL — ABNORMAL HIGH (ref 65–99)
Potassium: 5.3 mmol/L — ABNORMAL HIGH (ref 3.5–5.1)
Sodium: 135 mmol/L (ref 135–145)
TOTAL PROTEIN: 6.7 g/dL (ref 6.5–8.1)

## 2015-07-10 LAB — RAPID URINE DRUG SCREEN, HOSP PERFORMED
Amphetamines: NOT DETECTED
BENZODIAZEPINES: NOT DETECTED
Barbiturates: NOT DETECTED
COCAINE: NOT DETECTED
Opiates: NOT DETECTED
Tetrahydrocannabinol: NOT DETECTED

## 2015-07-10 LAB — CBG MONITORING, ED
GLUCOSE-CAPILLARY: 64 mg/dL — AB (ref 65–99)
GLUCOSE-CAPILLARY: 91 mg/dL (ref 65–99)

## 2015-07-10 LAB — ACETAMINOPHEN LEVEL

## 2015-07-10 LAB — AMMONIA: AMMONIA: 92 umol/L — AB (ref 9–35)

## 2015-07-10 LAB — GLUCOSE, CAPILLARY: GLUCOSE-CAPILLARY: 80 mg/dL (ref 65–99)

## 2015-07-10 LAB — SALICYLATE LEVEL: Salicylate Lvl: <4 mg/dL (ref 2.8–30.0)

## 2015-07-10 MED ORDER — RIFAXIMIN 550 MG PO TABS
550.0000 mg | ORAL_TABLET | Freq: Two times a day (BID) | ORAL | Status: DC
  Administered 2015-07-10 – 2015-07-13 (×6): 550 mg via ORAL
  Filled 2015-07-10 (×6): qty 1

## 2015-07-10 MED ORDER — SODIUM CHLORIDE 0.9 % IV SOLN
INTRAVENOUS | Status: DC
  Administered 2015-07-10 – 2015-07-13 (×5): via INTRAVENOUS

## 2015-07-10 MED ORDER — ONDANSETRON HCL 4 MG/2ML IJ SOLN: 4.0000 mg | Freq: Four times a day (QID) | INTRAMUSCULAR | Status: DC | PRN

## 2015-07-10 MED ORDER — GABAPENTIN 100 MG PO CAPS
100.0000 mg | ORAL_CAPSULE | Freq: Three times a day (TID) | ORAL | Status: DC
  Administered 2015-07-10 – 2015-07-13 (×8): 100 mg via ORAL
  Filled 2015-07-10 (×9): qty 1

## 2015-07-10 MED ORDER — INSULIN DEGLUDEC 200 UNIT/ML ~~LOC~~ SOPN
30.0000 [IU] | PEN_INJECTOR | Freq: Every day | SUBCUTANEOUS | Status: DC
  Administered 2015-07-11 – 2015-07-13 (×3): 30 [IU] via SUBCUTANEOUS

## 2015-07-10 MED ORDER — ACETAMINOPHEN 650 MG RE SUPP: 650.0000 mg | Freq: Four times a day (QID) | RECTAL | Status: DC | PRN

## 2015-07-10 MED ORDER — AMOXICILLIN 500 MG PO CAPS
500.0000 mg | ORAL_CAPSULE | Freq: Three times a day (TID) | ORAL | Status: DC
  Administered 2015-07-10 – 2015-07-13 (×8): 500 mg via ORAL
  Filled 2015-07-10 (×9): qty 1

## 2015-07-10 MED ORDER — LACTULOSE 10 GM/15ML PO SOLN
20.0000 g | Freq: Three times a day (TID) | ORAL | Status: DC
  Administered 2015-07-10 – 2015-07-12 (×7): 20 g via ORAL
  Filled 2015-07-10 (×9): qty 30

## 2015-07-10 MED ORDER — PANTOPRAZOLE SODIUM 40 MG PO TBEC
40.0000 mg | DELAYED_RELEASE_TABLET | Freq: Every day | ORAL | Status: DC
  Administered 2015-07-10 – 2015-07-13 (×4): 40 mg via ORAL
  Filled 2015-07-10 (×4): qty 1

## 2015-07-10 MED ORDER — BETHANECHOL CHLORIDE 25 MG PO TABS
50.0000 mg | ORAL_TABLET | Freq: Two times a day (BID) | ORAL | Status: DC
  Administered 2015-07-10 – 2015-07-13 (×6): 50 mg via ORAL
  Filled 2015-07-10 (×6): qty 2

## 2015-07-10 MED ORDER — INSULIN ASPART 100 UNIT/ML ~~LOC~~ SOLN
0.0000 [IU] | Freq: Three times a day (TID) | SUBCUTANEOUS | Status: DC
  Administered 2015-07-11: 1 [IU] via SUBCUTANEOUS
  Administered 2015-07-11: 2 [IU] via SUBCUTANEOUS

## 2015-07-10 MED ORDER — FLUOXETINE HCL 10 MG PO CAPS
10.0000 mg | ORAL_CAPSULE | Freq: Every day | ORAL | Status: DC
  Administered 2015-07-10 – 2015-07-13 (×4): 10 mg via ORAL
  Filled 2015-07-10 (×4): qty 1

## 2015-07-10 MED ORDER — TAMSULOSIN HCL 0.4 MG PO CAPS
0.4000 mg | ORAL_CAPSULE | Freq: Every day | ORAL | Status: DC
  Administered 2015-07-10 – 2015-07-13 (×4): 0.4 mg via ORAL
  Filled 2015-07-10 (×4): qty 1

## 2015-07-10 MED ORDER — ADULT MULTIVITAMIN W/MINERALS CH
1.0000 | ORAL_TABLET | Freq: Every day | ORAL | Status: DC
  Administered 2015-07-10 – 2015-07-13 (×4): 1 via ORAL
  Filled 2015-07-10 (×4): qty 1

## 2015-07-10 MED ORDER — ACETAMINOPHEN 325 MG PO TABS
650.0000 mg | ORAL_TABLET | Freq: Four times a day (QID) | ORAL | Status: DC | PRN
  Administered 2015-07-10: 650 mg via ORAL
  Filled 2015-07-10 (×2): qty 2

## 2015-07-10 MED ORDER — ONDANSETRON HCL 4 MG PO TABS: 4.0000 mg | ORAL_TABLET | Freq: Four times a day (QID) | ORAL | Status: DC | PRN

## 2015-07-10 NOTE — Telephone Encounter (Signed)
Pt was supposed to have BMET today. Wife called to let us know he is now at [facility] ER. [name] notified.

## 2015-07-10 NOTE — H&P (Addendum)
[facility] History and Physical  [name] UUE:[id number] DOB: February 13, 1951 DOA: 07/10/2015  Referring physician: EDP PCP: [name], MD   Chief Complaint: weakness HPI: [name] is a 65 y.o. 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 He underwent large volume paracentesis on 12/30 in IR per Gi recs, 8L drained them. His wife reports generalized weakness for 1-2 days, he had been sleeping all the time, very poor by mouth intake. She denies any recent changes in medications, no fevers or chills. No abdominal pain, no nausea, no vomiting  No hematemesis, no melena, no focal neurological symptoms. In the emergency room noted to have potassium of 5.3, BUN and creatinine higher than baseline at 42/2.2, ammonia at 92, pancytopenia which is stable at baseline   Review of Systems: positives bolded Constitutional:  No weight loss, night sweats, Fevers, chills, fatigue.  HEENT:  No headaches, Difficulty swallowing,Tooth/dental problems,Sore throat,  No sneezing, itching, ear ache, nasal congestion, post nasal drip,  Cardio-vascular:  No chest pain, Orthopnea, PND, swelling in lower extremities, anasarca, dizziness, palpitations  GI:  No heartburn, indigestion, abdominal pain, nausea, vomiting, diarrhea, change in bowel habits, loss of appetite  Resp:  No shortness of breath with exertion or at rest. No excess mucus, no productive cough, No non-productive cough, No coughing up of blood.No change in color of  mucus.No wheezing.No chest wall deformity  Skin:  no rash or lesions.  GU:  no dysuria, change in color of urine, no urgency or frequency. No flank pain.  Musculoskeletal:  No joint pain or swelling. No decreased range of motion. No back pain.  Psych:  No change in mood or affect. No depression or anxiety. No memory loss.   Past Medical History  Diagnosis Date  . Reflux esophagitis   . Portal hypertension ([place])   . Stricture and stenosis of esophagus   . Diverticulosis of colon (without mention of hemorrhage)   . Internal hemorrhoids without mention of complication   . Benign neoplasm of colon   . Duodenitis without mention of hemorrhage   . 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 Personal history of diseases of blood and blood-forming organs   . 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   Marland Kitchen Unspecified essential hypertension   . Obesity, unspecified   . Type II or unspecified type diabetes mellitus without mention of complication, not stated as uncontrolled   . GERD (gastroesophageal reflux disease)   . Hemorrhoids   . Colon polyps 2004, 2006, 2008, 11/2011    Adenomatous and tubulovillous adenoma.  . Pancytopenia   . Calculus of kidney yrs ago  . Ascites    Past Surgical History  Procedure Laterality Date  . Kidney stone surgery  yrs ago  . Esophagogastroduodenoscopy  08/01/2011    Procedure: ESOPHAGOGASTRODUODENOSCOPY (EGD);  Surgeon: Scarlette Shorts, MD;  Location: [facility];  Service: Endoscopy;  Laterality: N/A;  . Esophagogastroduodenoscopy N/A 10/27/2012    Procedure: ESOPHAGOGASTRODUODENOSCOPY (EGD);  Surgeon: Inda Castle, MD;  Location: [facility];  Service: Endoscopy;  Laterality: N/A;  . Esophagogastroduodenoscopy N/A  11/22/2012    Procedure: ESOPHAGOGASTRODUODENOSCOPY (EGD);  Surgeon: Irene Shipper, MD;  Location: [facility];  Service: Endoscopy;  Laterality: N/A;  . Colonoscopy    . Upper gastrointestinal endoscopy    .  Esophagogastroduodenoscopy N/A 11/29/2013    Procedure: ESOPHAGOGASTRODUODENOSCOPY (EGD);  Surgeon: Irene Shipper, MD;  Location: [facility];  Service: Endoscopy;  Laterality: N/A;  . Esophageal banding Bilateral 11/29/2013    Procedure: ESOPHAGEAL BANDING;  Surgeon: Irene Shipper, MD;  Location: [facility];  Service: Endoscopy;  Laterality: Bilateral;  . Esophagogastroduodenoscopy N/A 06/13/2014    Procedure: ESOPHAGOGASTRODUODENOSCOPY (EGD);  Surgeon: Irene Shipper, MD;  Location: [facility];  Service: Endoscopy;  Laterality: N/A;  . Esophageal banding N/A 06/13/2014    Procedure: ESOPHAGEAL BANDING;  Surgeon: Irene Shipper, MD;  Location: [facility];  Service: Endoscopy;  Laterality: N/A;   Social History:  reports that he has never smoked. He has never used smokeless tobacco. He reports that he does not drink alcohol or use illicit drugs.  No Known Allergies  Family History  Problem Relation Age of Onset  . Colon cancer Neg Hx   . Anesthesia problems Neg Hx   . Hypotension Neg Hx   . Malignant hyperthermia Neg Hx   . Pseudochol deficiency Neg Hx   . Diabetes Father   . Heart disease Father   . Aneurysm Mother   . Heart disease Other     neice  . Diabetes      niece    Prior to Admission medications   Medication Sig Start Date End Date Taking? Authorizing Provider  amoxicillin (AMOXIL) 500 MG capsule Take 1 capsule (500 mg total) by mouth 3 (three) times daily. 01/16/15  Yes Truman Hayward, MD  baclofen (LIORESAL) 20 MG tablet Take 1 tablet (20 mg total) by mouth at bedtime. 06/11/15  Yes Meredith Staggers, MD  bethanechol (URECHOLINE) 50 MG tablet Take 1 tablet by mouth 2 (two) times daily.  12/19/14  Yes Historical Provider, MD  FLUoxetine (PROZAC) 10 MG tablet Take 10 mg by mouth daily.   Yes Historical Provider, MD  furosemide (LASIX) 20 MG tablet Take 20 mg by mouth 3 (three) times daily.   Yes Historical Provider, MD  gabapentin (NEURONTIN) 100 MG capsule Take 300 mg by  mouth 3 (three) times daily.    Yes Historical Provider, MD  Insulin Degludec (TRESIBA FLEXTOUCH) 200 UNIT/ML SOPN Inject 60 Units into the skin daily.    Yes Historical Provider, MD  lactulose (CHRONULAC) 10 GM/15ML solution Take 6 tablespoons 4 times daily. 06/18/15  Yes Irene Shipper, MD  Multiple Vitamin (MULTIVITAMIN WITH MINERALS) TABS tablet Take 1 tablet by mouth daily.   Yes Historical Provider, MD  pantoprazole (PROTONIX) 40 MG tablet TAKE 1 TABLET (40 MG TOTAL) BY MOUTH DAILY. 11/13/14  Yes Irene Shipper, MD  spironolactone (ALDACTONE) 100 MG tablet Take 100 mg by mouth 2 (two) times daily.   Yes Historical Provider, MD  tamsulosin (FLOMAX) 0.4 MG CAPS capsule Take 1 capsule by mouth daily. Take once daily by mouth 30 minutes after supper 12/19/14  Yes Historical Provider, MD  Vitamin D, Ergocalciferol, (DRISDOL) 50000 UNITS CAPS capsule Take 50,000 Units by mouth every 7 (seven) days. Mondays. 03/29/15  Yes Historical Provider, MD  XIFAXAN 550 MG TABS tablet TAKE 1 TABLET (550 MG TOTAL) BY MOUTH 2 (TWO) TIMES DAILY. 06/27/15  Yes Irene Shipper, MD   Physical Exam: Filed Vitals:   07/10/15 1521  07/10/15 1713  BP: 114/71 117/74  Pulse: 77 72  Temp: 97.5 F (36.4 C)   TempSrc: Oral   Resp: 18 14  SpO2: 100% 100%    Wt Readings from Last 3 Encounters:  06/18/15 84.732 kg (186 lb 12.8 oz)  05/23/15 81.874 kg (180 lb 8 oz)  05/14/15 82.725 kg (182 lb 6 oz)    General:  Appears calm and comfortable Eyes: PERRL, normal lids, irises & conjunctiva ENT: grossly normal hearing, lips & tongue Neck: no LAD, masses or thyromegaly Cardiovascular: RRR, no m/r/g. No LE edema. Telemetry: SR, no arrhythmias  Respiratory: CTA bilaterally, no w/r/r. Normal respiratory effort. Abdomen: soft, ntnd Skin: no rash or induration seen on limited exam Musculoskeletal: grossly normal tone BUE/BLE Psychiatric: grossly normal mood and affect, speech fluent and appropriate Neurologic: grossly non-focal.            Labs on Admission:  Basic Metabolic Panel:  Recent Labs Lab 07/10/15 1607  NA 135  K 5.3*  CL 101  CO2 22  GLUCOSE 113*  BUN 42*  CREATININE 2.21*  CALCIUM 9.5   Liver Function Tests:  Recent Labs Lab 07/10/15 1607  AST 31  ALT 23  ALKPHOS 71  BILITOT 2.3*  PROT 6.7  ALBUMIN 3.6   No results for input(s): LIPASE, AMYLASE in the last 168 hours.  Recent Labs Lab 07/10/15 1607  AMMONIA 92*   CBC:  Recent Labs Lab 07/10/15 1607  WBC 3.8*  NEUTROABS 3.0  HGB 10.9*  HCT 32.0*  MCV 90.4  PLT 28*   Cardiac Enzymes: No results for input(s): CKTOTAL, CKMB, CKMBINDEX, TROPONINI in the last 168 hours.  BNP (last 3 results) No results for input(s): BNP in the last 8760 hours.  ProBNP (last 3 results) No results for input(s): PROBNP in the last 8760 hours.  CBG:  Recent Labs Lab 07/10/15 1516 07/10/15 1711  GLUCAP 64* 91    Radiological Exams on Admission: Dg Chest 2 View  07/10/2015  CLINICAL DATA:  Generalized weakness and aching pain. EXAM: CHEST - 2 VIEW COMPARISON:  One-view chest x-ray 04/26/2015. Two-view chest x-ray 10/21/2014 FINDINGS: Low lung volumes exaggerate the heart size. Mild bibasilar atelectasis or scarring is again seen. No other significant airspace consolidation is present. There is no edema or effusion to suggest failure. Exaggerated thoracic kyphosis is similar the prior exam. IMPRESSION: No acute cardiopulmonary disease or significant interval change. Electronically Signed   By: San Morelle M.D.   On: 07/10/2015 16:39   Ct Head Wo Contrast  07/10/2015  CLINICAL DATA:  Hx of cirrhosis, need for liver transplant. Here today for generalized weakness, generalized aching pain. Wife states all of this over the last three days. Presents at the ER with lethargy, mild confusion, CBG 64 EXAM: CT HEAD WITHOUT CONTRAST TECHNIQUE: Contiguous axial images were obtained from the base of the skull through the vertex without  intravenous contrast. COMPARISON:  PET-CT 04/2015 FINDINGS: No acute intracranial hemorrhage. No focal mass lesion. No CT evidence of acute infarction. No midline shift or mass effect. No hydrocephalus. Basilar cisterns are patent. Mild generalized cortical atrophy periventricular white matter hypodensities not changed prior. Paranasal sinuses and  mastoid air cells are clear. IMPRESSION: 1. No acute intracranial findings. 2. Stable atrophy and white matter microvascular disease. Electronically Signed   By: Suzy Bouchard M.D.   On: 07/10/2015 16:33    Assessment/Plan Principal Problem:  Metabolic encephalopathy.  I suspect this is multifactorial, his ammonia level is higher than baseline  at 44 hence hepatic encephalopathy is definitely contributing -In addition his BUN and creatinine are higher than baseline, could be a component of dehydration/volume repletion related to his large volume paracentesis -Thirdly he's on gabapentin 300 mg 3 times a day which in the setting of acute kidney injury can cause increased retention and encephalopathy hence will cut down the dose to 100 mg 3 times a day. -Resume lactulose and rifaximin   Hepatic encephalopathy  -As above    NASH with cirrhosis /portal hypertension /recurrent ascites /severe thrombocytopenia  -Was just seen at the [facility] last week for transplant evaluation not considered a candidate to be on the list at this time due to ongoing cervical spine infection/epidural abscess  -I will hold diuretic status at this time due to AKI   Diabetes mellitus type 2, uncontrolled  Resume insulin at half home dose, sliding scale sensitive    Severe  Thrombocytopenia  -Chronic close to baseline , he is an ecchymotic area on his back  -Monitor, transfuse for platelets less than 10,000 or active bleeding     AKI (acute kidney injury)  -on CKD 3, baseline creatinine around 1.5, hydrate and monitor -Suspect this was secondary to large volume paracentesis  on 12/30, 8 L drained then - hold Lasix and Aldactone, gentle IV fluids,  -bmet in a.m.    H/o Large cervical epidural abscess/GBS bacteremia with residual persistent MRI changes in disk at C3 -on chronic amoxicillin regimen per Dr.VanDam and Dr.Cram -Afebrile, stable and from this standpoint, follow-up with Dr.Van Dam   mild hyperkalemia  -due to acute kidney injury, no EKG changes, hold Aldactone  - repeat B met  in a.m.  Code Status: DNR DVT Prophylaxis:SCDs Family Communication: wife at bedside Disposition Plan: inpatient  Time spent: 55mn  Jylan Loeza [facility] Pager [phone]

## 2015-07-10 NOTE — ED Notes (Signed)
Pt aware of urine sample.urinal in hand 

## 2015-07-10 NOTE — Telephone Encounter (Signed)
Left message for pt to call back.  Wife aware.

## 2015-07-10 NOTE — ED Notes (Addendum)
CBG had dropped from 97 with EMS to 64 on arrival to ED. Gave patient some orange juice, was able to swallow without problem, but swallowed very slowly.

## 2015-07-10 NOTE — Telephone Encounter (Signed)
Order faxed to [facility] at [phone], wife aware.

## 2015-07-10 NOTE — Telephone Encounter (Signed)
-----   Message from Algernon Huxley, RN sent at 06/27/2015  1:22 PM EST ----- Regarding: bmet Pt needs labs in 2 weeks, orders in epic.

## 2015-07-10 NOTE — ED Notes (Signed)
UNABLE TO COLLECT LABS ON PATIENT MD IS IN THE ROOM WITH THE PATIENT.

## 2015-07-10 NOTE — ED Provider Notes (Signed)
CSN: [id number]     Arrival date & time 07/10/15  1504 History   First MD Initiated Contact with Patient 07/10/15 1551     Chief Complaint  Patient presents with  . Altered Mental Status  . Weakness  . Generalized Body Aches      HPI Per EMS, hx of cirrhosis, need for liver transplant. Here today for generalized weakness, generalized aching pain. Wife states all of this over the last three days. Presents at the ER with lethargy, mild confusion, CBG 64 after getting an insulin shot without eating lunch. Alert to person, place, situation. Able to answer what month it was after some prompting.  Past Medical History  Diagnosis Date  . Reflux esophagitis   . Portal hypertension ([place])   . Stricture and stenosis of esophagus   . Diverticulosis of colon (without mention of hemorrhage)   . Internal hemorrhoids without mention of complication   . Benign neoplasm of colon   . Duodenitis without mention of hemorrhage   . 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 Personal history of diseases of blood and blood-forming organs   . 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   Marland Kitchen Unspecified essential hypertension   . Obesity, unspecified   . Type II or unspecified type diabetes mellitus without mention of complication, not stated as uncontrolled   . GERD (gastroesophageal reflux disease)   . Hemorrhoids   . Colon polyps 2004, 2006, 2008, 11/2011    Adenomatous and tubulovillous adenoma.  . Pancytopenia   . Calculus of kidney yrs ago  . Ascites    Past Surgical History  Procedure Laterality Date  . Kidney stone surgery  yrs ago  . Esophagogastroduodenoscopy  08/01/2011    Procedure: ESOPHAGOGASTRODUODENOSCOPY (EGD);  Surgeon: Scarlette Shorts, MD;  Location: [facility];  Service: Endoscopy;  Laterality: N/A;  . Esophagogastroduodenoscopy N/A 10/27/2012    Procedure: ESOPHAGOGASTRODUODENOSCOPY (EGD);  Surgeon: Inda Castle, MD;  Location: [facility];  Service:  Endoscopy;  Laterality: N/A;  . Esophagogastroduodenoscopy N/A 11/22/2012    Procedure: ESOPHAGOGASTRODUODENOSCOPY (EGD);  Surgeon: Irene Shipper, MD;  Location: [facility];  Service: Endoscopy;  Laterality: N/A;  . Colonoscopy    . Upper gastrointestinal endoscopy    . Esophagogastroduodenoscopy N/A 11/29/2013    Procedure: ESOPHAGOGASTRODUODENOSCOPY (EGD);  Surgeon: Irene Shipper, MD;  Location: [facility];  Service: Endoscopy;  Laterality: N/A;  . Esophageal banding Bilateral 11/29/2013    Procedure: ESOPHAGEAL BANDING;  Surgeon: Irene Shipper, MD;  Location: [facility];  Service: Endoscopy;  Laterality: Bilateral;  . Esophagogastroduodenoscopy N/A 06/13/2014    Procedure: ESOPHAGOGASTRODUODENOSCOPY (EGD);  Surgeon: Irene Shipper, MD;  Location: [facility];  Service: Endoscopy;  Laterality: N/A;  . Esophageal banding N/A 06/13/2014    Procedure: ESOPHAGEAL BANDING;  Surgeon: Irene Shipper, MD;  Location: [facility];  Service: Endoscopy;  Laterality: N/A;   Family History  Problem Relation Age of Onset  . Colon cancer Neg Hx   . Anesthesia problems Neg Hx   . Hypotension Neg Hx   . Malignant hyperthermia Neg Hx   . Pseudochol deficiency Neg Hx   . Diabetes Father   . Heart disease Father   . Aneurysm Mother   . Heart disease Other     neice  . Diabetes      niece   Social History  Substance Use Topics  . Smoking status: Never Smoker   . Smokeless tobacco: Never Used  .  Alcohol Use: No     Comment: very rare    Review of Systems  All other systems reviewed and are negative.     Allergies  Review of patient's allergies indicates no known allergies.  Home Medications   Prior to Admission medications   Medication Sig Start Date End Date Taking? Authorizing Provider  amoxicillin (AMOXIL) 500 MG capsule Take 1 capsule (500 mg total) by mouth 3 (three) times daily. 01/16/15  Yes Truman Hayward, MD  baclofen (LIORESAL) 20 MG tablet Take 1 tablet (20 mg total) by mouth  at bedtime. 06/11/15  Yes Meredith Staggers, MD  bethanechol (URECHOLINE) 50 MG tablet Take 1 tablet by mouth 2 (two) times daily.  12/19/14  Yes Historical Provider, MD  FLUoxetine (PROZAC) 10 MG tablet Take 10 mg by mouth daily.   Yes Historical Provider, MD  gabapentin (NEURONTIN) 100 MG capsule Take 300 mg by mouth 3 (three) times daily.    Yes Historical Provider, MD  lactulose (CHRONULAC) 10 GM/15ML solution Take 6 tablespoons 4 times daily. 06/18/15  Yes Irene Shipper, MD  Multiple Vitamin (MULTIVITAMIN WITH MINERALS) TABS tablet Take 1 tablet by mouth daily.   Yes Historical Provider, MD  pantoprazole (PROTONIX) 40 MG tablet TAKE 1 TABLET (40 MG TOTAL) BY MOUTH DAILY. 11/13/14  Yes Irene Shipper, MD  spironolactone (ALDACTONE) 100 MG tablet Take 100 mg by mouth 2 (two) times daily.   Yes Historical Provider, MD  tamsulosin (FLOMAX) 0.4 MG CAPS capsule Take 1 capsule by mouth daily. Take once daily by mouth 30 minutes after supper 12/19/14  Yes Historical Provider, MD  Vitamin D, Ergocalciferol, (DRISDOL) 50000 UNITS CAPS capsule Take 50,000 Units by mouth every 7 (seven) days. Mondays. 03/29/15  Yes Historical Provider, MD  XIFAXAN 550 MG TABS tablet TAKE 1 TABLET (550 MG TOTAL) BY MOUTH 2 (TWO) TIMES DAILY. 06/27/15  Yes Irene Shipper, MD  furosemide (LASIX) 20 MG tablet Take 1 tablet (20 mg total) by mouth 2 (two) times daily. 07/13/15   Robbie Lis, MD  insulin aspart (NOVOLOG FLEXPEN) 100 UNIT/ML FlexPen CBG 121 - 150: 1 unit    CBG 151 - 200: 2 units    CBG 201 - 250: 3 units    CBG 251 - 300: 5 units    CBG 301 - 350: 7 units    CBG 351 - 400 9 units 07/13/15   Robbie Lis, MD  Insulin Degludec (TRESIBA FLEXTOUCH) 200 UNIT/ML SOPN Inject 45 Units into the skin daily. 07/13/15   Robbie Lis, MD   BP 99/59 mmHg  Pulse 72  Temp(Src) 97.8 F (36.6 C) (Oral)  Resp 20  Ht 5\' 10"  (1.778 m)  Wt 171 lb 4.8 oz (77.7 kg)  BMI 24.58 kg/m2  SpO2 100% Physical Exam Physical Exam   Nursing note and vitals reviewed. Constitutional: He appears sleepy and lethargic.  He does awaken and answer questions along with a basic commands and then falls back asleep.   HENT:  Head: Normocephalic and atraumatic.  Eyes: Pupils are equal, round, and reactive to light.  Neck: Normal range of motion.  Cardiovascular: Normal rate and intact distal pulses.   Pulmonary/Chest: No respiratory distress.  Abdominal: Normal appearance. He exhibits no distension.  Musculoskeletal: Normal range of motion.  Neurological: . No cranial nerve deficit.  no gross motor sensory deficit. Skin: Skin is warm and dry.  Patient has petechiae on the left side of his neck and also  large petechial area on the posterior thorax.     ED Course  Procedures (including critical care time)   Labs Review Labs Reviewed  COMPREHENSIVE METABOLIC PANEL - Abnormal; Notable for the following:    Potassium 5.3 (*)    Glucose, Bld 113 (*)    BUN 42 (*)    Creatinine, Ser 2.21 (*)    Total Bilirubin 2.3 (*)    GFR calc non Af Amer 30 (*)    GFR calc Af Amer 35 (*)    All other components within normal limits  CBC WITH DIFFERENTIAL/PLATELET - Abnormal; Notable for the following:    WBC 3.8 (*)    RBC 3.54 (*)    Hemoglobin 10.9 (*)    HCT 32.0 (*)    Platelets 28 (*)    Monocytes Absolute 0.0 (*)    All other components within normal limits  AMMONIA - Abnormal; Notable for the following:    Ammonia 92 (*)    All other components within normal limits  URINALYSIS, ROUTINE W REFLEX MICROSCOPIC (NOT AT [facility]) - Abnormal; Notable for the following:    Color, Urine AMBER (*)    APPearance CLOUDY (*)    All other components within normal limits  ACETAMINOPHEN LEVEL - Abnormal; Notable for the following:    Acetaminophen (Tylenol), Serum <10 (*)    All other components within normal limits  CBC - Abnormal; Notable for the following:    RBC 3.43 (*)    Hemoglobin 10.7 (*)    HCT 30.9 (*)    RDW 15.6 (*)     Platelets 28 (*)    All other components within normal limits  COMPREHENSIVE METABOLIC PANEL - Abnormal; Notable for the following:    Glucose, Bld 52 (*)    BUN 45 (*)    Creatinine, Ser 2.42 (*)    Total Bilirubin 2.6 (*)    GFR calc non Af Amer 27 (*)    GFR calc Af Amer 31 (*)    All other components within normal limits  GLUCOSE, CAPILLARY - Abnormal; Notable for the following:    Glucose-Capillary 51 (*)    All other components within normal limits  GLUCOSE, CAPILLARY - Abnormal; Notable for the following:    Glucose-Capillary 272 (*)    All other components within normal limits  GLUCOSE, CAPILLARY - Abnormal; Notable for the following:    Glucose-Capillary 170 (*)    All other components within normal limits  GLUCOSE, CAPILLARY - Abnormal; Notable for the following:    Glucose-Capillary 145 (*)    All other components within normal limits  GLUCOSE, CAPILLARY - Abnormal; Notable for the following:    Glucose-Capillary 59 (*)    All other components within normal limits  CBC - Abnormal; Notable for the following:    WBC 3.3 (*)    RBC 3.41 (*)    Hemoglobin 10.6 (*)    HCT 30.9 (*)    Platelets 26 (*)    All other components within normal limits  BASIC METABOLIC PANEL - Abnormal; Notable for the following:    Sodium 133 (*)    BUN 33 (*)    Creatinine, Ser 1.46 (*)    GFR calc non Af Amer 50 (*)    GFR calc Af Amer 58 (*)    All other components within normal limits  GLUCOSE, CAPILLARY - Abnormal; Notable for the following:    Glucose-Capillary 100 (*)    All other components within normal limits  GLUCOSE, CAPILLARY - Abnormal; Notable for the following:    Glucose-Capillary 124 (*)    All other components within normal limits  GLUCOSE, CAPILLARY - Abnormal; Notable for the following:    Glucose-Capillary 223 (*)    All other components within normal limits  GLUCOSE, CAPILLARY - Abnormal; Notable for the following:    Glucose-Capillary 118 (*)    All other  components within normal limits  GLUCOSE, CAPILLARY - Abnormal; Notable for the following:    Glucose-Capillary 202 (*)    All other components within normal limits  CBG MONITORING, ED - Abnormal; Notable for the following:    Glucose-Capillary 64 (*)    All other components within normal limits  URINE CULTURE  URINE RAPID DRUG SCREEN, HOSP PERFORMED  SALICYLATE LEVEL  GLUCOSE, CAPILLARY  GLUCOSE, CAPILLARY  GLUCOSE, CAPILLARY  CBG MONITORING, ED    Imaging Review     CT Head Wo Contrast (Final result) Result time: 07/10/15 16:33:27   Final result by Rad Results In Interface (07/10/15 16:33:27)   Narrative:   CLINICAL DATA: Hx of cirrhosis, need for liver transplant. Here today for generalized weakness, generalized aching pain. Wife states all of this over the last three days. Presents at the ER with lethargy, mild confusion, CBG 64  EXAM: CT HEAD WITHOUT CONTRAST  TECHNIQUE: Contiguous axial images were obtained from the base of the skull through the vertex without intravenous contrast.  COMPARISON: PET-CT 04/2015  FINDINGS: No acute intracranial hemorrhage. No focal mass lesion. No CT evidence of acute infarction. No midline shift or mass effect. No hydrocephalus. Basilar cisterns are patent.  Mild generalized cortical atrophy periventricular white matter hypodensities not changed prior.  Paranasal sinuses and mastoid air cells are clear.  IMPRESSION: 1. No acute intracranial findings. 2. Stable atrophy and white matter microvascular disease.   Electronically Signed By: Suzy Bouchard M.D. On: 07/10/2015 16:33     CRITICAL CARE Performed by: Leonard Schwartz L Total critical care time: 30 minutes Critical care time was exclusive of separately billable procedures and treating other patients. Critical care was necessary to treat or prevent imminent or life-threatening deterioration. Critical care was time spent personally by me on the following  activities: development of treatment plan with patient and/or surrogate as well as nursing, discussions with consultants, evaluation of patient's response to treatment, examination of patient, obtaining history from patient or surrogate, ordering and performing treatments and interventions, ordering and review of laboratory studies, ordering and review of radiographic studies, pulse oximetry and re-evaluation of patient's condition.   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

## 2015-07-10 NOTE — ED Notes (Signed)
Per EMS, hx of cirrhosis, need for liver transplant. Here today for generalized weakness, generalized aching pain. Wife states all of this over the last three days. Presents at the ER with lethargy, mild confusion, CBG 64 after getting an insulin shot without eating lunch. Alert to person, place, situation. Able to answer what month it was after some prompting.

## 2015-07-11 DIAGNOSIS — D638 Anemia in other chronic diseases classified elsewhere: Secondary | ICD-10-CM

## 2015-07-11 DIAGNOSIS — K7290 Hepatic failure, unspecified without coma: Principal | ICD-10-CM

## 2015-07-11 DIAGNOSIS — N184 Chronic kidney disease, stage 4 (severe): Secondary | ICD-10-CM

## 2015-07-11 DIAGNOSIS — Z794 Long term (current) use of insulin: Secondary | ICD-10-CM

## 2015-07-11 DIAGNOSIS — D696 Thrombocytopenia, unspecified: Secondary | ICD-10-CM

## 2015-07-11 DIAGNOSIS — N179 Acute kidney failure, unspecified: Secondary | ICD-10-CM

## 2015-07-11 DIAGNOSIS — E1121 Type 2 diabetes mellitus with diabetic nephropathy: Secondary | ICD-10-CM

## 2015-07-11 LAB — COMPREHENSIVE METABOLIC PANEL
ALT: 23 U/L (ref 17–63)
AST: 24 U/L (ref 15–41)
Albumin: 3.7 g/dL (ref 3.5–5.0)
Alkaline Phosphatase: 64 U/L (ref 38–126)
Anion gap: 9 (ref 5–15)
BUN: 45 mg/dL — AB (ref 6–20)
CHLORIDE: 103 mmol/L (ref 101–111)
CO2: 24 mmol/L (ref 22–32)
CREATININE: 2.42 mg/dL — AB (ref 0.61–1.24)
Calcium: 9.5 mg/dL (ref 8.9–10.3)
GFR calc non Af Amer: 27 mL/min — ABNORMAL LOW (ref >60)
GFR, EST AFRICAN AMERICAN: 31 mL/min — AB (ref >60)
Glucose, Bld: 52 mg/dL — ABNORMAL LOW (ref 65–99)
Potassium: 5 mmol/L (ref 3.5–5.1)
SODIUM: 136 mmol/L (ref 135–145)
TOTAL PROTEIN: 6.5 g/dL (ref 6.5–8.1)
Total Bilirubin: 2.6 mg/dL — ABNORMAL HIGH (ref 0.3–1.2)

## 2015-07-11 LAB — CBC
HCT: 30.9 % — ABNORMAL LOW (ref 39.0–52.0)
HEMOGLOBIN: 10.7 g/dL — AB (ref 13.0–17.0)
MCH: 31.2 pg (ref 26.0–34.0)
MCHC: 34.6 g/dL (ref 30.0–36.0)
MCV: 90.1 fL (ref 78.0–100.0)
PLATELETS: 28 10*3/uL — AB (ref 150–400)
RBC: 3.43 MIL/uL — AB (ref 4.22–5.81)
RDW: 15.6 % — AB (ref 11.5–15.5)
WBC: 4.1 10*3/uL (ref 4.0–10.5)

## 2015-07-11 LAB — GLUCOSE, CAPILLARY
GLUCOSE-CAPILLARY: 51 mg/dL — AB (ref 65–99)
GLUCOSE-CAPILLARY: 272 mg/dL — AB (ref 65–99)
Glucose-Capillary: 170 mg/dL — ABNORMAL HIGH (ref 65–99)
Glucose-Capillary: 145 mg/dL — ABNORMAL HIGH (ref 65–99)

## 2015-07-11 NOTE — Evaluation (Addendum)
Physical Therapy Evaluation Patient Details Name: [name] MRN: [id number] DOB: 04-02-1951 Today's Date: 07/11/2015   History of Present Illness  65 yo male admitted with hepatic encephalopathy.   Clinical Impression  On eval, pt required Min guard assist for mobility-walked ~150 feet while holding on to IV pole. Pt tolerated distance well. Wife present during session. Recommend HHPT follow up at discharge. Pt has cane and walker available at home. Instructed him to use walker PRN.     Follow Up Recommendations Home health PT;Supervision/Assistance - 24 hour    Equipment Recommendations  None recommended by PT    Recommendations for Other Services       Precautions / Restrictions Precautions Precautions: Fall Restrictions Weight Bearing Restrictions: No      Mobility  Bed Mobility Overal bed mobility: Needs Assistance Bed Mobility: Supine to Sit;Sit to Supine     Supine to sit: Modified independent (Device/Increase time) Sit to supine: Modified independent (Device/Increase time)      Transfers Overall transfer level: Needs assistance Equipment used: Rolling walker (2 wheeled) Transfers: Sit to/from Stand           General transfer comment: for safety  Ambulation/Gait Ambulation/Gait assistance: Min guard Ambulation Distance (Feet): 150 Feet Assistive device:  (IV pole) Gait Pattern/deviations: Step-through pattern;Decreased stride length     General Gait Details: close guard for safety. Pt tolerated distance well. Slightly unsteady  Stairs            Wheelchair Mobility    Modified Rankin (Stroke Patients Only)       Balance Overall balance assessment: Needs assistance         Standing balance support: During functional activity Standing balance-Leahy Scale: Fair                               Pertinent Vitals/Pain Pain Assessment: No/denies pain    Home Living Family/patient expects to be discharged to:: Private  residence Living Arrangements: Spouse/significant other   Type of Home: House Home Access: Stairs to enter Entrance Stairs-Rails: Right Entrance Stairs-Number of Steps: 2 Home Layout: Multi-level;Able to live on main level with bedroom/bathroom Home Equipment: Kasandra Knudsen - single point;Walker - 2 wheels;Bedside commode;Shower seat      Prior Function Level of Independence: Independent with assistive device(s)         Comments: using cane in community. furniture walking inside home.      Hand Dominance        Extremity/Trunk Assessment   Upper Extremity Assessment: Overall WFL for tasks assessed           Lower Extremity Assessment: Generalized weakness      Cervical / Trunk Assessment: Normal  Communication   Communication: No difficulties  Cognition Arousal/Alertness: Awake/alert Behavior During Therapy: WFL for tasks assessed/performed Overall Cognitive Status: Within Functional Limits for tasks assessed                      General Comments      Exercises        Assessment/Plan    PT Assessment Patient needs continued PT services  PT Diagnosis Difficulty walking;Generalized weakness   PT Problem List Decreased balance;Decreased activity tolerance;Decreased mobility;Decreased strength  PT Treatment Interventions Gait training;Functional mobility training;Therapeutic activities;Patient/family education;Balance training;Therapeutic exercise   PT Goals (Current goals can be found in the Care Plan section) Acute Rehab PT Goals Patient Stated Goal: to keep getting better PT Goal  Formulation: With patient/family Time For Goal Achievement: 07/25/15 Potential to Achieve Goals: Good    Frequency Min 3X/week   Barriers to discharge        Co-evaluation               End of Session Equipment Utilized During Treatment: Gait belt Activity Tolerance: Patient tolerated treatment well Patient left: in bed;with call bell/phone within reach;with  family/visitor present;with bed alarm set           Time: 1414-1441 PT Time Calculation (min) (ACUTE ONLY): 27 min   Charges:   PT Evaluation $PT Eval Low Complexity: 1 Procedure PT Treatments $Gait Training: 8-22 mins   PT G Codes:        [name], MPT Pager: [phone]

## 2015-07-11 NOTE — Care Management Note (Signed)
Case Management Note  Patient Details  Name: [name] MRN: [id number] Date of Birth: 29-Mar-1951  Subjective/Objective: 65 y/o m admitted w/Hepatic encephalopathy. Hx: cirrhosis. From home.                   Action/Plan:d/c plan home.   Expected Discharge Date:                  Expected Discharge Plan:  Home/Self Care  In-House Referral:     Discharge planning Services  CM Consult  Post Acute Care Choice:    Choice offered to:     DME Arranged:    DME Agency:     HH Arranged:    HH Agency:     Status of Service:  In process, will continue to follow  Medicare Important Message Given:    Date Medicare IM Given:    Medicare IM give by:    Date Additional Medicare IM Given:    Additional Medicare Important Message give by:     If discussed at [place] of Stay Meetings, dates discussed:    Additional Comments:  Dessa Phi, RN 07/11/2015, 12:16 PM

## 2015-07-11 NOTE — Progress Notes (Signed)
Pt somewhat confused.  Discussed walking to bathroom to void earlier in the shift.  Pt reports he does not remember going in to use the bathroom.  Pt reported that he did void to the tech when he was assisted earlier.  Notified MD via phone earlier. Andre Lefort

## 2015-07-11 NOTE — Progress Notes (Signed)
Patient ID: [name], male   DOB: 01-25-1951, 65 y.o.   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 He underwent large volume paracentesis 07/06/2015 with 8 L fluid drained at that time patient's wife reported patient had poor by mouth intake and was more weak and lethargic.  On admission, ammonia level was 92 and potassium was 5.3. He was admitted for further evaluation and management of acute encephalopathy.   Assessment/Plan:    Principal Problem:   Hepatic encephalopathy (HCC) / NASH (nonalcoholic steatohepatitis) - Acute encephalopathy likely related to end-stage liver disease - Patient still remains intermittently confused although mental status is better since the admission - Ammonia level was 92 at the time of the admission although we don't routinely test ammonia level as it may not correlate with mental status changes - We will continue monitor clinical course - Continue lactulose and rifaximin - Continue Protonix - Obtain physical therapy evaluation  Active Problems:   Thrombocytopenia ([place])  - Related to bone marrow suppression from liver cirrhosis - Stable at 28 - Using SCDs for DVT prophylaxis    Anemia of chronic disease - Secondary to end-stage liver disease - Hemoglobin is 10.7, stable    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      Acute renal failure superimposed on chronic kidney disease stage IV - Baseline  creatinine in November 2016 was 1.65 and on this admission 2.21 - Worsening renal function could be a component of hepatorenal syndrome - We'll continue to monitor creatinine on daily basis    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  Family Communication:  plan of care discussed with the patient Disposition Plan: anticipated discharge by 07/14/2015. Patient needs physical therapy evaluation for safe discharge plan  IV access:  Peripheral IV  Procedures and diagnostic studies:    Dg Chest 2 View 07/10/2015  No acute cardiopulmonary disease or significant interval change. Electronically Signed   By: San Morelle M.D.   On: 07/10/2015 16:39   Ct Head Wo Contrast 07/10/2015  1. No acute intracranial findings. 2. Stable atrophy and white matter microvascular disease. Electronically Signed   By: Suzy Bouchard M.D.   On: 07/10/2015 16:33   Medical Consultants:  None   Other Consultants:  PT  IAnti-Infectives:   Amoxicillin - on chronic abx therapy     Leisa Lenz, MD  [facility] Pager [phone]  Time spent in minutes: 25 minutes  If 7PM-7AM, please contact night-coverage [email or web address] Password [facility] 07/11/2015, 12:47 PM   LOS: 1 day    HPI/Subjective: No acute overnight events. Patient reports no abdominal pain.  Objective: Filed Vitals:   07/10/15 1850 07/10/15 1948 07/10/15 2013 07/11/15 [id number]  BP: 98/54 116/72 98/57 99/57   Pulse: 66 69 76 81  Temp:   97.9 F (36.6 C) 98.7 F (37.1 C)  TempSrc:   Oral Oral  Resp: 14 14 16 16   Height:   5\' 10"  (1.778 m)   Weight:   171  lb 4.8 oz (77.7 kg)   SpO2: 98% 100% 100% 97%    Intake/Output Summary (Last 24 hours) at 07/11/15 1247 Last data filed at 07/11/15 1000  Gross per 24 hour  Intake  987.5 ml  Output    250 ml  Net  737.5 ml    Exam:   General:  Pt is alert, not in acute distress  Cardiovascular: Regular rate and rhythm, S1/S2, no murmurs  Respiratory: Clear to auscultation bilaterally, no wheezing, no crackles, no rhonchi  Abdomen: distended, bowel sounds present  Extremities: No edema, pulses DP and PT palpable bilaterally  Neuro: Grossly nonfocal  Data Reviewed: Basic Metabolic Panel:  Recent Labs Lab 07/10/15 1607 07/11/15 0520  NA 135 136  K 5.3* 5.0  CL 101 103  CO2 22 24  GLUCOSE 113* 52*  BUN 42* 45*  CREATININE 2.21* 2.42*  CALCIUM 9.5 9.5   Liver Function Tests:  Recent Labs Lab 07/10/15 1607 07/11/15 0520  AST 31 24  ALT 23 23  ALKPHOS 71 64  BILITOT 2.3* 2.6*  PROT 6.7 6.5  ALBUMIN 3.6 3.7   No results for input(s): LIPASE, AMYLASE in the last 168 hours.  Recent Labs Lab 07/10/15 1607  AMMONIA 92*   CBC:  Recent Labs Lab 07/10/15 1607 07/11/15 0520  WBC 3.8* 4.1  NEUTROABS 3.0  --   HGB 10.9* 10.7*  HCT 32.0* 30.9*  MCV 90.4 90.1  PLT 28* 28*   Cardiac Enzymes: No results for input(s): CKTOTAL, CKMB, CKMBINDEX, TROPONINI in the last 168 hours. BNP: Invalid input(s): POCBNP CBG:  Recent Labs Lab 07/10/15 1711 07/10/15 2128 07/11/15 0729 07/11/15 0742 07/11/15 1149  GLUCAP 91 80 51* 272* 170*    Recent Results (from the past 240 hour(s))  Urine culture     Status: None (Preliminary result)   Collection Time: 07/10/15  6:05 PM  Result Value Ref Range Status   Specimen Description URINE, CLEAN CATCH  Final   Special Requests NONE  Final   Culture   Final    NO GROWTH < 24 HOURS Performed at [facility]    Report Status PENDING  Incomplete     Scheduled Meds: . amoxicillin  500 mg Oral TID  . bethanechol  50 mg  Oral BID  . FLUoxetine  10 mg Oral Daily  . gabapentin  100 mg Oral TID  . insulin aspart  0-9 Units Subcutaneous TID WC  . Insulin Degludec  30 Units Subcutaneous Daily  . lactulose  20 g Oral TID  . multivitamin with minerals  1 tablet Oral Daily  . pantoprazole  40 mg Oral Daily  . rifaximin  550 mg Oral BID  . tamsulosin  0.4 mg Oral Daily   Continuous Infusions: . sodium chloride 75 mL/hr at 07/11/15 0915

## 2015-07-11 NOTE — Progress Notes (Signed)
Pt reports inability to void.  Bladder scan for 250cc.  Notified MD. Andre Lefort

## 2015-07-12 LAB — URINE CULTURE

## 2015-07-12 LAB — GLUCOSE, CAPILLARY
GLUCOSE-CAPILLARY: 87 mg/dL (ref 65–99)
GLUCOSE-CAPILLARY: 59 mg/dL — AB (ref 65–99)
GLUCOSE-CAPILLARY: 69 mg/dL (ref 65–99)
Glucose-Capillary: 100 mg/dL — ABNORMAL HIGH (ref 65–99)

## 2015-07-12 NOTE — Care Management Note (Signed)
Case Management Note  Patient Details  Name: [name] MRN: [id number] Date of Birth: 1950-11-19  Subjective/Objective: HHPT ordered. Will check benefits for in network [facility] agency.[facility]-no, [place]-no, [facility]-no, [place].                   Action/Plan:d/c plan home w/HHPT.   Expected Discharge Date:                  Expected Discharge Plan:  [place]  In-House Referral:     Discharge planning Services  CM Consult  Post Acute Care Choice:    Choice offered to:  Patient  DME Arranged:    DME Agency:     HH Arranged:    [place] Agency:     Status of Service:  In process, will continue to follow  Medicare Important Message Given:    Date Medicare IM Given:    Medicare IM give by:    Date Additional Medicare IM Given:    Additional Medicare Important Message give by:     If discussed at [place] of Stay Meetings, dates discussed:    Additional Comments:  Dessa Phi, RN 07/12/2015, 1:18 PM

## 2015-07-12 NOTE — Progress Notes (Signed)
Patient ID: [name], male   DOB: June 29, 1951, 65 y.o.   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 He underwent large volume paracentesis 07/06/2015 with 8 L fluid drained at that time patient's wife reported patient had poor by mouth intake and was more weak and lethargic.   On admission, ammonia level was 92 and potassium was 5.3. He was admitted for further evaluation and management of acute encephalopathy.   Assessment/Plan:    Principal Problem:   Hepatic encephalopathy (HCC) / NASH (nonalcoholic steatohepatitis) - Acute encephalopathy likely related to end-stage liver disease - CT head with no acute intracranial findings  - Mental status better this morning although he does go through periods of intermittent confusion per nursing report - Continue lactulose and rifaximin - Continue Protonix - PT recommended home health PT. 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      Acute renal failure superimposed on chronic kidney disease stage IV - Baseline creatinine in November 2016 was 1.65 and on this admission 2.21 - Worsening renal  function could be a component of hepatorenal syndrome - We'll continue to monitor creatinine on daily basis    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  Family Communication:  plan of care discussed with the patient and his wife at the bedside Disposition Plan: home with [facility] 07/14/2015   IV access:  Peripheral IV  Procedures and diagnostic studies:    Dg Chest 2 View 07/10/2015  No acute cardiopulmonary disease or significant interval change. Electronically Signed   By: San Morelle M.D.   On: 07/10/2015 16:39   Ct Head Wo Contrast 07/10/2015  1. No acute intracranial findings. 2. Stable atrophy and white matter microvascular disease. Electronically Signed   By: Suzy Bouchard M.D.   On: 07/10/2015 16:33   Medical Consultants:  None   Other Consultants:  PT  IAnti-Infectives:   Amoxicillin - on chronic abx therapy     Leisa Lenz, MD  [facility] Pager [phone]  Time spent in minutes: 25 minutes  If 7PM-7AM, please contact night-coverage [email or web address] Password [email or web address] 07/12/2015, 12:30 PM   LOS: 2 days    HPI/Subjective: No acute overnight events. Patient reports he feels better.  Objective: Filed Vitals:   07/11/15 0534 07/11/15 1422 07/11/15 2104 07/12/15 0427  BP: 99/57 108/58 101/66 99/54  Pulse: 81 67 71 64  Temp: 98.7 F (37.1 C) 98.5 F (36.9 C) 98.9 F (37.2 C) 98.4 F (36.9 C)  TempSrc: Oral Oral Oral Oral  Resp: 16 20 20 20   Height:      Weight:      SpO2: 97% 98% 100% 100%    Intake/Output Summary (Last 24 hours) at  07/12/15 1230 Last data filed at 07/12/15 0643  Gross per 24 hour  Intake 2055.83 ml  Output      0 ml  Net 2055.83 ml    Exam:   General:  Pt is awake, more alert this morning  Cardiovascular: Rate controlled, appreciate S1-S2  Respiratory: No wheezing, no rhonchi  Abdomen: distended but not tender, appreciate bowel sounds  Extremities: No leg swelling, pulses palpable  Neuro: No focal deficits  Data Reviewed: Basic Metabolic Panel:  Recent Labs Lab 07/10/15 1607 07/11/15 0520  NA 135 136  K 5.3* 5.0  CL 101 103  CO2 22 24  GLUCOSE 113* 52*  BUN 42* 45*  CREATININE 2.21* 2.42*  CALCIUM 9.5 9.5   Liver Function Tests:  Recent Labs Lab 07/10/15 1607 07/11/15 0520  AST 31 24  ALT 23 23  ALKPHOS 71 64  BILITOT 2.3* 2.6*  PROT 6.7 6.5  ALBUMIN 3.6 3.7   No results for input(s): LIPASE, AMYLASE in the last 168 hours.  Recent Labs Lab 07/10/15 1607  AMMONIA 92*   CBC:  Recent Labs Lab 07/10/15 1607 07/11/15 0520  WBC 3.8* 4.1  NEUTROABS 3.0  --   HGB 10.9* 10.7*  HCT 32.0* 30.9*  MCV 90.4 90.1  PLT 28* 28*   Cardiac Enzymes: No results for input(s): CKTOTAL, CKMB, CKMBINDEX, TROPONINI in the last 168 hours. BNP: Invalid input(s): POCBNP CBG:  Recent Labs Lab 07/11/15 0729 07/11/15 0742 07/11/15 1149 07/11/15 1642 07/12/15 0740  GLUCAP 51* 272* 170* 145* 87    Recent Results (from the past 240 hour(s))  Urine culture     Status: None   Collection Time: 07/10/15  6:05 PM  Result Value Ref Range Status   Specimen Description URINE, CLEAN CATCH  Final   Special Requests NONE  Final   Culture   Final    60,000 COLONIES/ml YEAST Performed at [facility]    Report Status 07/12/2015 FINAL  Final     Scheduled Meds: . amoxicillin  500 mg Oral TID  . bethanechol  50 mg Oral BID  . FLUoxetine  10 mg Oral Daily  . gabapentin  100 mg Oral TID  . insulin aspart  0-9 Units Subcutaneous TID WC  . Insulin Degludec  30 Units  Subcutaneous Daily  . lactulose  20 g Oral TID  . multivitamin with minerals  1 tablet Oral Daily  . pantoprazole  40 mg Oral Daily  . rifaximin  550 mg Oral BID  . tamsulosin  0.4 mg Oral Daily   Continuous Infusions: . sodium chloride 50 mL/hr at 07/12/15 1204

## 2015-07-12 NOTE — Progress Notes (Signed)
Contact made with On Call provider Baltazar Najjar, to report Pt CBG of 223 no night coverage oders are avaliable. Baltazar Najjar responded stating that no orders or action to take at this time.

## 2015-07-12 NOTE — Care Management Note (Signed)
Case Management Note  Patient Details  Name: [name] MRN: [id number] Date of Birth: 12/11/1950  Subjective/Objective: [facility] accepted patient's insurance-spoke to [place] tel#[phone]/fax#[phone]-faxed electronically-called to confirm received-HHRN/PT/OT/aide-SOC 07/18/15-MD/patient/spouse in agreement.  Will fax d/c summary in am once received.                 Action/Plan:d/c in am HHC.   Expected Discharge Date:                  Expected Discharge Plan:  [place]  In-House Referral:     Discharge planning Services  CM Consult  Post Acute Care Choice:    Choice offered to:  Patient  DME Arranged:    DME Agency:     HH Arranged:  RN, PT, OT, Nurse's Aide [place] Agency:  [place]  Status of Service:  In process, will continue to follow  Medicare Important Message Given:    Date Medicare IM Given:    Medicare IM give by:    Date Additional Medicare IM Given:    Additional Medicare Important Message give by:     If discussed at [place] of Stay Meetings, dates discussed:    Additional Comments:  Dessa Phi, RN 07/12/2015, 2:06 PM

## 2015-07-13 DIAGNOSIS — K7581 Nonalcoholic steatohepatitis (NASH): Secondary | ICD-10-CM

## 2015-07-13 DIAGNOSIS — B951 Streptococcus, group B, as the cause of diseases classified elsewhere: Secondary | ICD-10-CM

## 2015-07-13 LAB — CBC
HEMATOCRIT: 30.9 % — AB (ref 39.0–52.0)
HEMOGLOBIN: 10.6 g/dL — AB (ref 13.0–17.0)
MCH: 31.1 pg (ref 26.0–34.0)
MCHC: 34.3 g/dL (ref 30.0–36.0)
MCV: 90.6 fL (ref 78.0–100.0)
PLATELETS: 26 10*3/uL — AB (ref 150–400)
RBC: 3.41 MIL/uL — AB (ref 4.22–5.81)
RDW: 15.5 % (ref 11.5–15.5)
WBC: 3.3 10*3/uL — ABNORMAL LOW (ref 4.0–10.5)

## 2015-07-13 LAB — BASIC METABOLIC PANEL
Anion gap: 8 (ref 5–15)
BUN: 33 mg/dL — AB (ref 6–20)
CO2: 23 mmol/L (ref 22–32)
CREATININE: 1.46 mg/dL — AB (ref 0.61–1.24)
Calcium: 8.9 mg/dL (ref 8.9–10.3)
Chloride: 102 mmol/L (ref 101–111)
GFR, EST AFRICAN AMERICAN: 58 mL/min — AB (ref >60)
GFR, EST NON AFRICAN AMERICAN: 50 mL/min — AB (ref >60)
Glucose, Bld: 79 mg/dL (ref 65–99)
POTASSIUM: 4.6 mmol/L (ref 3.5–5.1)
SODIUM: 133 mmol/L — AB (ref 135–145)

## 2015-07-13 LAB — GLUCOSE, CAPILLARY
GLUCOSE-CAPILLARY: 124 mg/dL — AB (ref 65–99)
GLUCOSE-CAPILLARY: 223 mg/dL — AB (ref 65–99)
Glucose-Capillary: 118 mg/dL — ABNORMAL HIGH (ref 65–99)
Glucose-Capillary: 202 mg/dL — ABNORMAL HIGH (ref 65–99)

## 2015-07-13 MED ORDER — INSULIN ASPART 100 UNIT/ML ~~LOC~~ SOLN: 0.0000 [IU] | Freq: Three times a day (TID) | SUBCUTANEOUS | Status: DC

## 2015-07-13 MED ORDER — FUROSEMIDE 20 MG PO TABS: 20.0000 mg | ORAL_TABLET | Freq: Two times a day (BID) | ORAL | Status: DC

## 2015-07-13 MED ORDER — INSULIN ASPART 100 UNIT/ML FLEXPEN: PEN_INJECTOR | SUBCUTANEOUS | Status: DC

## 2015-07-13 MED ORDER — INSULIN DEGLUDEC 200 UNIT/ML ~~LOC~~ SOPN: 45.0000 [IU] | PEN_INJECTOR | Freq: Every day | SUBCUTANEOUS | Status: DC

## 2015-07-13 MED ORDER — VITAMINS A & D EX OINT
TOPICAL_OINTMENT | CUTANEOUS | Status: AC
  Administered 2015-07-13: 01:00:00
  Filled 2015-07-13: qty 5

## 2015-07-13 NOTE — Discharge Summary (Addendum)
Physician Discharge Summary  [name] [id number] DOB: 1950-10-27 DOA: 07/10/2015  PCP: [name], MD  Admit date: 07/10/2015 Discharge date: 07/13/2015  Recommendations for Outpatient Follow-up:  1. Sliding scale insulin prescription provided   Discharge Diagnoses:  Principal Problem:   Hepatic encephalopathy ([place]) Active Problems:   NASH (nonalcoholic steatohepatitis)   Other cirrhosis of liver (HCC)   Diabetes mellitus type 2, uncontrolled (HCC)   Thrombocytopenia (HCC)   Group B streptococcal infection   Hyperammonemia (HCC)   AKI (acute kidney injury) ([place])   Altered level of consciousness   Encephalopathy, metabolic    Discharge Condition: stable   Diet recommendation: as tolerated   History of present illness:  65 year old male with past medical history of non-alcoholic steatohepatosis, liver cirrhosis, history of variceal bleeding, encephalopathy, ascites, group B strep bacteremia and 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 He underwent large volume paracentesis 07/06/2015 with 8 L fluid drained at that time patient's wife reported patient had poor by mouth intake and was more weak and lethargic.   On admission, ammonia level was 92 and potassium was 5.3. He was admitted for further evaluation and management of acute encephalopathy.  Hospital Course:   Assessment/Plan:    Principal Problem:  Hepatic encephalopathy (HCC) / NASH (nonalcoholic steatohepatitis) - Acute encephalopathy likely related to end-stage liver disease - CT head with no acute intracranial findings  - Mental status better this morning  - Continue lactulose and rifaximin on discharge  - Continue Protonix - HH orders placed per PT evaluation    Active Problems:   Thrombocytopenia ([place])  - Related to bone marrow suppression from liver cirrhosis - Stable at 28 - Check CBC in the morning   Anemia of chronic disease - Secondary to end-stage liver disease - Hemoglobin is 10.6, stable   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  Family Communication: plan of care discussed with the patient and his wife at the bedside   IV access:  Peripheral IV  Procedures and diagnostic studies:   Dg Chest 2 View 07/10/2015 No acute cardiopulmonary disease or significant interval change. Electronically Signed By: San Morelle M.D. On: 07/10/2015 16:39   Ct Head Wo Contrast 07/10/2015 1. No acute intracranial findings. 2. Stable atrophy and white matter microvascular disease. Electronically Signed By: Suzy Bouchard M.D. On: 07/10/2015 16:33   Medical Consultants:  None   Other Consultants:  PT  IAnti-Infectives:   Amoxicillin - on chronic abx therapy     Signed:  Leisa Lenz, MD  [facility] 07/13/2015, 9:32 AM  Pager #: [phone]  Time  spent in minutes: less than 30 minutes   Discharge Exam: Filed Vitals:    07/12/15 2100 07/13/15 0435  BP: 116/64 99/59  Pulse: 64 72  Temp: 98 F (36.7 C) 97.8 F (36.6 C)  Resp: 20 20   Filed Vitals:   07/12/15 1444 07/12/15 1510 07/12/15 2100 07/13/15 0435  BP: 85/56 94/53 116/64 99/59  Pulse: 62  64 72  Temp: 98.1 F (36.7 C)  98 F (36.7 C) 97.8 F (36.6 C)  TempSrc: Oral  Oral Oral  Resp: 20  20 20   Height:      Weight:      SpO2: 100%  100% 100%    General: Pt is alert, follows commands appropriately, not in acute distress Cardiovascular: Regular rate and rhythm, S1/S2 +, no murmurs Respiratory: Clear to auscultation bilaterally, no wheezing, no crackles, no rhonchi Abdominal: Soft, non tender, non distended, bowel sounds +, no guarding Extremities: no edema, no cyanosis, pulses palpable bilaterally DP and PT Neuro: Grossly nonfocal  Discharge Instructions  Discharge Instructions    Call MD for:  difficulty breathing, headache or visual disturbances    Complete by:  As directed      Call MD for:  persistant dizziness or light-headedness    Complete by:  As directed      Call MD for:  persistant nausea and vomiting    Complete by:  As directed      Call MD for:  severe uncontrolled pain    Complete by:  As directed      Diet - low sodium heart healthy    Complete by:  As directed      Discharge instructions    Complete by:  As directed   Sliding scale insulin:  CBG 121 - 150: 1 unit    CBG 151 - 200: 2 units    CBG 201 - 250: 3 units    CBG 251 - 300: 5 units    CBG 301 - 350: 7 units    CBG 351 - 400 9 units     Increase activity slowly    Complete by:  As directed             Medication List    TAKE these medications        amoxicillin 500 MG capsule  Commonly known as:  AMOXIL  Take 1 capsule (500 mg total) by mouth 3 (three) times daily.     baclofen 20 MG tablet  Commonly known as:  LIORESAL  Take 1 tablet (20 mg total) by mouth at bedtime.     bethanechol 50 MG tablet  Commonly known as:   URECHOLINE  Take 1 tablet by mouth 2 (two) times daily.     FLUoxetine 10 MG tablet  Commonly known as:  PROZAC  Take 10 mg by mouth daily.     furosemide 20 MG tablet  Commonly known as:  LASIX  Take 1 tablet (20 mg total) by mouth 2 (two) times daily.     gabapentin 100 MG capsule  Commonly known as:  NEURONTIN  Take 300 mg by mouth 3 (three) times daily.     insulin aspart 100 UNIT/ML FlexPen  Commonly known as:  NOVOLOG FLEXPEN  CBG 121 - 150:1 unit CBG 151 - 200:2 units CBG 201 - 250:3 units CBG 251 - 300:5 units CBG 301 - 350:7 units CBG 351 - 4009 units     Insulin Degludec 200 UNIT/ML Sopn  Commonly known as:  TRESIBA FLEXTOUCH  Inject 45 Units into the skin daily.     lactulose 10 GM/15ML solution  Commonly known as:  CHRONULAC  Take 6 tablespoons 4 times daily.     multivitamin with minerals Tabs tablet  Take 1 tablet by mouth daily.     pantoprazole 40 MG tablet  Commonly known as:  PROTONIX  TAKE 1 TABLET (40 MG TOTAL) BY MOUTH DAILY.     spironolactone 100 MG tablet  Commonly known as:  ALDACTONE  Take 100 mg by mouth 2 (two) times daily.     tamsulosin 0.4 MG Caps capsule  Commonly known as:  FLOMAX  Take 1 capsule by mouth daily. Take once daily by mouth 30 minutes after supper     Vitamin D (Ergocalciferol) 50000 units Caps capsule  Commonly known as:  DRISDOL  Take 50,000 Units by mouth every 7 (seven) days. Mondays.     XIFAXAN 550 MG Tabs tablet  Generic drug:  rifaximin  TAKE 1 TABLET (550 MG TOTAL) BY MOUTH 2 (TWO) TIMES DAILY.            Follow-up Information    Follow up with [facility].   Why:  HHRN/PT/OT/aide-start of care 07/18/15   Contact information:   [phone]      Follow up with [name], MD. Schedule an appointment as soon as possible for a visit in 1 week.   Specialty:  Family Medicine   Why:  Follow up appt after recent hospitalization   Contact information:   [place] [phone]        The results of significant diagnostics from this hospitalization (including imaging, microbiology, ancillary and laboratory) are listed below for reference.    Significant Diagnostic Studies: Dg Chest 2 View  07/10/2015  CLINICAL DATA:  Generalized weakness and aching pain. EXAM: CHEST - 2 VIEW COMPARISON:  One-view chest x-ray 04/26/2015. Two-view chest x-ray 10/21/2014 FINDINGS: Low lung volumes exaggerate the heart size. Mild bibasilar atelectasis or scarring is again seen. No other significant airspace consolidation is present. There is no edema or effusion to suggest failure. Exaggerated thoracic kyphosis is similar the prior exam. IMPRESSION: No acute cardiopulmonary disease or significant interval change. Electronically Signed   By: San Morelle M.D.   On: 07/10/2015 16:39   Ct Head Wo Contrast  07/10/2015  CLINICAL DATA:  Hx of cirrhosis, need for liver transplant. Here today for generalized weakness, generalized aching pain. Wife states all of this over the last three days. Presents at the ER with lethargy, mild confusion, CBG 64 EXAM: CT HEAD WITHOUT CONTRAST TECHNIQUE: Contiguous axial images were obtained from the base of the skull through the vertex without intravenous contrast. COMPARISON:  PET-CT 04/2015 FINDINGS: No acute intracranial hemorrhage. No focal mass lesion. No CT evidence of acute infarction. No midline shift or mass effect. No hydrocephalus. Basilar cisterns are patent. Mild generalized cortical atrophy periventricular white matter hypodensities not changed prior. Paranasal sinuses and  mastoid air cells are clear. IMPRESSION: 1. No acute intracranial findings. 2. Stable atrophy and white matter microvascular disease. Electronically Signed   By: Suzy Bouchard M.D.   On: 07/10/2015 16:33   US Paracentesis  07/06/2015  INDICATION: Cirrhosis with recurrent ascites. Request diagnostic and therapeutic paracentesis.  EXAM: ULTRASOUND-GUIDED PARACENTESIS COMPARISON:  Paracentesis 06/26/2015. MEDICATIONS: None. COMPLICATIONS: None immediate TECHNIQUE: Informed written consent was obtained from the patient after a discussion of the risks, benefits and alternatives to  treatment. A timeout was performed prior to the initiation of the procedure. Initial ultrasound scanning demonstrates a large amount of ascites within the left lower abdominal quadrant. The left lower abdomen was prepped and draped in the usual sterile fashion. 1% lidocaine was used for local anesthesia. Under direct ultrasound guidance, a 19 gauge, 7-cm, Yueh catheter was introduced. An ultrasound image was saved for documentation purposed. The paracentesis was performed. The catheter was removed and a dressing was applied. The patient tolerated the procedure well without immediate post procedural complication. Post procedure IV Albumin was ordered. FINDINGS: A total of approximately 8 liters of serosanguinous fluid was removed. Samples were sent to the laboratory as requested by the clinical team. IMPRESSION: Successful ultrasound-guided paracentesis yielding 8 liters of peritoneal fluid. Read By:  Tsosie Billing PA-C Electronically Signed   By: Aletta Edouard M.D.   On: 07/06/2015 12:48   US Paracentesis  06/26/2015  CLINICAL DATA:  Cirrhosis with recurrent ascites. Request diagnostic and therapeutic paracentesis. EXAM: ULTRASOUND GUIDED PARACENTESIS COMPARISON:  Previous paracentesis PROCEDURE: An ultrasound guided paracentesis was thoroughly discussed with the patient and questions answered. The benefits, risks, alternatives and complications were also discussed. The patient understands and wishes to proceed with the procedure. Written consent was obtained. Ultrasound was performed to localize and mark an adequate pocket of fluid in the left lower quadrant of the abdomen. The area was then prepped and draped in the normal sterile fashion. 1% Lidocaine was used  for local anesthesia. Under ultrasound guidance a Safe-T-Centesis catheter was introduced. Paracentesis was performed. The catheter was removed and a dressing applied. COMPLICATIONS: None immediate FINDINGS: A total of approximately 8 L of serosanguineous fluid was removed. A fluid sample was sent for laboratory analysis. IMPRESSION: Successful ultrasound guided paracentesis yielding 8 L of ascites. Read by: Ascencion Dike PA-C Electronically Signed   By: Lucrezia Europe M.D.   On: 06/26/2015 12:15    Microbiology: Recent Results (from the past 240 hour(s))  Urine culture     Status: None   Collection Time: 07/10/15  6:05 PM  Result Value Ref Range Status   Specimen Description URINE, CLEAN CATCH  Final   Special Requests NONE  Final   Culture   Final    60,000 COLONIES/ml YEAST Performed at [facility]    Report Status 07/12/2015 FINAL  Final     Labs: Basic Metabolic Panel:  Recent Labs Lab 07/10/15 1607 07/11/15 0520 07/13/15 0453  NA 135 136 133*  K 5.3* 5.0 4.6  CL 101 103 102  CO2 22 24 23   GLUCOSE 113* 52* 79  BUN 42* 45* 33*  CREATININE 2.21* 2.42* 1.46*  CALCIUM 9.5 9.5 8.9   Liver Function Tests:  Recent Labs Lab 07/10/15 1607 07/11/15 0520  AST 31 24  ALT 23 23  ALKPHOS 71 64  BILITOT 2.3* 2.6*  PROT 6.7 6.5  ALBUMIN 3.6 3.7   No results for input(s): LIPASE, AMYLASE in the last 168 hours.  Recent Labs Lab 07/10/15 1607  AMMONIA 92*   CBC:  Recent Labs Lab 07/10/15 1607 07/11/15 0520 07/13/15 0453  WBC 3.8* 4.1 3.3*  NEUTROABS 3.0  --   --   HGB 10.9* 10.7* 10.6*  HCT 32.0* 30.9* 30.9*  MCV 90.4 90.1 90.6  PLT 28* 28* 26*   Cardiac Enzymes: No results for input(s): CKTOTAL, CKMB, CKMBINDEX, TROPONINI in the last 168 hours. BNP: BNP (last 3 results) No results for input(s): BNP in the last 8760 hours.  ProBNP (  last 3 results) No results for input(s): PROBNP in the last 8760 hours.  CBG:  Recent Labs Lab 07/12/15 1203  07/12/15 1229 07/12/15 1711 07/12/15 2108 07/13/15 0754  GLUCAP 59* 69 100* 223* 118*

## 2015-07-13 NOTE — Care Management Note (Signed)
Case Management Note  Patient Details  Name: YEDIEL ABOOD MRN: [id number] Date of Birth: 11/26/1950  Subjective/Objective:Faxed d/c order, d/c summary to [facility] care electronically-fax#[phone].                    Action/Plan:d/c home w/HHC.   Expected Discharge Date:                  Expected Discharge Plan:  [place]  In-House Referral:     Discharge planning Services  CM Consult  Post Acute Care Choice:    Choice offered to:  Patient  DME Arranged:    DME Agency:     HH Arranged:  RN, PT, OT, Nurse's Aide [place] Agency:  [place]  Status of Service:  In process, will continue to follow  Medicare Important Message Given:    Date Medicare IM Given:    Medicare IM give by:    Date Additional Medicare IM Given:    Additional Medicare Important Message give by:     If discussed at [place] of Stay Meetings, dates discussed:    Additional Comments:  Dessa Phi, RN 07/13/2015, 12:30 PM

## 2015-07-13 NOTE — Discharge Instructions (Signed)
Blood Glucose Monitoring, Adult ° °Monitoring your blood glucose (also know as blood sugar) helps you to manage your diabetes. It also helps you and your health care provider monitor your diabetes and determine how well your treatment plan is working. °WHY SHOULD YOU MONITOR YOUR BLOOD GLUCOSE? °· It can help you understand how food, exercise, and medicine affect your blood glucose. °· It allows you to know what your blood glucose is at any given moment. You can quickly tell if you are having low blood glucose (hypoglycemia) or high blood glucose (hyperglycemia). °· It can help you and your health care provider know how to adjust your medicines. °· It can help you understand how to manage an illness or adjust medicine for exercise. °WHEN SHOULD YOU TEST? °Your health care provider will help you decide how often you should check your blood glucose. This may depend on the type of diabetes you have, your diabetes control, or the types of medicines you are taking. Be sure to write down all of your blood glucose readings so that this information can be reviewed with your health care provider. See below for examples of testing times that your health care provider may suggest. °Type 1 Diabetes °· Test at least 2 times per day if your diabetes is well controlled, if you are using an insulin pump, or if you perform multiple daily injections. °· If your diabetes is not well controlled or if you are sick, you may need to test more often. °· It is a good idea to also test: °¨ Before every insulin injection. °¨ Before and after exercise. °¨ Between meals and 2 hours after a meal. °¨ Occasionally between 2:00 a.m. and 3:00 a.m. °Type 2 Diabetes °· If you are taking insulin, test at least 2 times per day. However, it is best to test before every insulin injection. °· If you take medicines by mouth (orally), test 2 times a day. °· If you are on a controlled diet, test once a day. °· If your diabetes is not well controlled or if you  are sick, you may need to monitor more often. °HOW TO MONITOR YOUR BLOOD GLUCOSE °Supplies Needed °· Blood glucose meter. °· Test strips for your meter. Each meter has its own strips. You must use the strips that go with your own meter. °· A pricking needle (lancet). °· A device that holds the lancet (lancing device). °· A journal or log book to write down your results. °Procedure °· Wash your hands with soap and water. Alcohol is not preferred. °· Prick the side of your finger (not the tip) with the lancet. °· Gently milk the finger until a small drop of blood appears. °· Follow the instructions that come with your meter for inserting the test strip, applying blood to the strip, and using your blood glucose meter. °Other Areas to Get Blood for Testing °Some meters allow you to use other areas of your body (other than your finger) to test your blood. These areas are called alternative sites. The most common alternative sites are: °· The forearm. °· The thigh. °· The back area of the lower leg. °· The palm of the hand. °The blood flow in these areas is slower. Therefore, the blood glucose values you get may be delayed, and the numbers are different from what you would get from your fingers. Do not use alternative sites if you think you are having hypoglycemia. Your reading will not be accurate. Always use a finger if you   are having hypoglycemia. Also, if you cannot feel your lows (hypoglycemia unawareness), always use your fingers for your blood glucose checks. °ADDITIONAL TIPS FOR GLUCOSE MONITORING °· Do not reuse lancets. °· Always carry your supplies with you. °· All blood glucose meters have a 24-hour "hotline" number to call if you have questions or need help. °· Adjust (calibrate) your blood glucose meter with a control solution after finishing a few boxes of strips. °BLOOD GLUCOSE RECORD KEEPING °It is a good idea to keep a daily record or log of your blood glucose readings. Most glucose meters, if not all,  keep your glucose records stored in the meter. Some meters come with the ability to download your records to your home computer. Keeping a record of your blood glucose readings is especially helpful if you are wanting to look for patterns. Make notes to go along with the blood glucose readings because you might forget what happened at that exact time. Keeping good records helps you and your health care provider to work together to achieve good diabetes management.  °  °This information is not intended to replace advice given to you by your health care provider. Make sure you discuss any questions you have with your health care provider. °  °Document Released: 06/26/2003 Document Revised: 07/14/2014 Document Reviewed: 11/15/2012 °Elsevier Interactive Patient Education ©2016 Elsevier Inc. ° °

## 2015-07-18 ENCOUNTER — Telehealth: Payer: Self-pay | Admitting: Internal Medicine

## 2015-07-18 ENCOUNTER — Other Ambulatory Visit: Payer: Self-pay

## 2015-07-18 DIAGNOSIS — R188 Other ascites: Secondary | ICD-10-CM

## 2015-07-18 NOTE — Telephone Encounter (Signed)
Pt scheduled for [place] para at [facility] 07/24/15@9am , pt to arrive there at 8:45am. Pt to receive albumin replacement for each liter drawn off. Max 8 liters to be removed. Fluid to be sent for cell count and diff. Pts wife aware of appt.

## 2015-07-18 NOTE — Telephone Encounter (Signed)
See below

## 2015-07-18 NOTE — Telephone Encounter (Signed)
Large-volume paracentesis with IV albumin replacement. Send fluid for cell count with differential

## 2015-07-18 NOTE — Telephone Encounter (Signed)
Left message for pt to call back  °

## 2015-07-18 NOTE — Telephone Encounter (Signed)
Pts wife states pt has a lot of fluid in his belly again and has had a 10 lb wt gain. Pt would like to have another paracentesis. Please advise.

## 2015-07-20 ENCOUNTER — Telehealth: Payer: Self-pay | Admitting: Internal Medicine

## 2015-07-20 NOTE — Telephone Encounter (Signed)
Pt states he doesn't know if he can wait until Tuesday for his paracentesis. Discussed with pt that if he cannot wait until Tuesday he could always go to the ER. Pt wanted to schedule follow-up appt. Pt scheduled to see [name] 08/17/15@2 :45pm. Pt aware of appt.

## 2015-07-23 ENCOUNTER — Telehealth: Payer: Self-pay | Admitting: Internal Medicine

## 2015-07-23 ENCOUNTER — Other Ambulatory Visit: Payer: Self-pay

## 2015-07-23 DIAGNOSIS — R188 Other ascites: Secondary | ICD-10-CM

## 2015-07-23 NOTE — Telephone Encounter (Signed)
Pt states his home health nurse mentioned a "one way valve" that could be placed when he had his paracentesis and it would allow her to come and drain the fluid at home twice a week. Discussed with pt that cirrhosis patients usually did not have this done due to the risk of infection. Pt wanted to know what [name] thinks about this. Please advise.

## 2015-07-24 ENCOUNTER — Ambulatory Visit (HOSPITAL_COMMUNITY): Payer: Managed Care, Other (non HMO)

## 2015-07-24 ENCOUNTER — Encounter (HOSPITAL_COMMUNITY)
Admission: RE | Admit: 2015-07-24 | Discharge: 2015-07-24 | Disposition: A | Discharge status: Home / Self Care | Payer: Managed Care, Other (non HMO) | Source: Ambulatory Visit | Attending: Internal Medicine | Admitting: Internal Medicine

## 2015-07-24 ENCOUNTER — Ambulatory Visit (HOSPITAL_COMMUNITY)
Admission: RE | Admit: 2015-07-24 | Discharge: 2015-07-24 | Disposition: A | Discharge status: Home / Self Care | Payer: Managed Care, Other (non HMO) | Source: Ambulatory Visit | Attending: Internal Medicine | Admitting: Internal Medicine

## 2015-07-24 DIAGNOSIS — R188 Other ascites: Secondary | ICD-10-CM | POA: Insufficient documentation

## 2015-07-24 DIAGNOSIS — K7460 Unspecified cirrhosis of liver: Secondary | ICD-10-CM | POA: Insufficient documentation

## 2015-07-24 LAB — BODY FLUID CELL COUNT WITH DIFFERENTIAL
LYMPHS FL: 25 %
Monocyte-Macrophage-Serous Fluid: 72 % (ref 50–90)
NEUTROPHIL FLUID: 3 % (ref 0–25)
WBC FLUID: 73 uL (ref 0–1000)

## 2015-07-24 LAB — PATHOLOGIST SMEAR REVIEW

## 2015-07-24 MED ORDER — ALBUMIN HUMAN 25 % IV SOLN
INTRAVENOUS | Status: AC
  Administered 2015-07-24: 62.5 g via INTRAVENOUS
  Filled 2015-07-24: qty 250

## 2015-07-24 MED ORDER — ALBUMIN HUMAN 25 % IV SOLN
62.5000 g | Freq: Once | INTRAVENOUS | Status: AC
  Administered 2015-07-24: 62.5 g via INTRAVENOUS
  Filled 2015-07-24: qty 300

## 2015-07-24 MED ORDER — SODIUM CHLORIDE 0.9 % IV SOLN
INTRAVENOUS | Status: AC
Start: 2015-07-24 — End: 2015-07-24
  Administered 2015-07-24: 11:00:00 via INTRAVENOUS

## 2015-07-24 NOTE — Telephone Encounter (Signed)
Not advised because of risk of infection

## 2015-07-24 NOTE — Discharge Instructions (Signed)
Paracentesis, Care After Refer to this sheet in the next few weeks. These instructions provide you with information about caring for yourself after your procedure. Your health care provider may also give you more specific instructions. Your treatment has been planned according to current medical practices, but problems sometimes occur. Call your health care provider if you have any problems or questions after your procedure. WHAT TO EXPECT AFTER THE PROCEDURE After your procedure, it is common to have a small amount of clear fluid coming from the puncture site. HOME CARE INSTRUCTIONS  Return to your normal activities as told by your health care provider. Ask your health care provider what activities are safe for you.  Take over-the-counter and prescription medicines only as told by your health care provider.  Do not take baths, swim, or use a hot tub until your health care provider approves.  Follow instructions from your health care provider about:  How to take care of your puncture site.  When and how you should change your bandage (dressing).  When you should remove your dressing.  Check your puncture area every day signs of infection. Watch for:  Redness, swelling, or pain.  Fluid, blood, or pus.  Keep all follow-up visits as told by your health care provider. This is important. SEEK MEDICAL CARE IF:  You have redness, swelling, or pain at your puncture site.  You start to have more clear fluid coming from your puncture site.  You have blood or pus coming from your puncture site.  You have chills.  You have a fever. SEEK IMMEDIATE MEDICAL CARE IF:  You develop chest pain or shortness of breath.  You develop increasing pain, discomfort, or swelling in your abdomen.  You feel dizzy or light-headed or you pass out.   This information is not intended to replace advice given to you by your health care provider. Make sure you discuss any questions you have with your health  care provider.   Document Released: 11/07/2014 Document Reviewed: 11/07/2014 Elsevier Interactive Patient Education 2016 Elsevier Inc.     Albumin injection What is this medicine? ALBUMIN (al BYOO min) is used to treat or prevent shock following serious injury, bleeding, surgery, or burns by increasing the volume of blood plasma. This medicine can also replace low blood protein. This medicine may be used for other purposes; ask your health care provider or pharmacist if you have questions. What should I tell my health care provider before I take this medicine? They need to know if you have any of the following conditions: -anemia -heart disease -kidney disease -an unusual or allergic reaction to albumin, other medicines, foods, dyes, or preservatives -pregnant or trying to get pregnant -breast-feeding How should I use this medicine? This medicine is for infusion into a vein. It is given by a health-care professional in a hospital or clinic. Talk to your pediatrician regarding the use of this medicine in children. While this drug may be prescribed for selected conditions, precautions do apply. Overdosage: If you think you have taken too much of this medicine contact a poison control center or emergency room at once. NOTE: This medicine is only for you. Do not share this medicine with others. What if I miss a dose? This does not apply. What may interact with this medicine? Interactions are not expected. This list may not describe all possible interactions. Give your health care provider a list of all the medicines, herbs, non-prescription drugs, or dietary supplements you use. Also tell them if you  smoke, drink alcohol, or use illegal drugs. Some items may interact with your medicine. What should I watch for while using this medicine? Your condition will be closely monitored while you receive this medicine. Some products are derived from human plasma, and there is a small risk that  these products may contain certain types of virus or bacteria. All products are processed to kill most viruses and bacteria. If you have questions concerning the risk of infections, discuss them with your doctor or health care professional. What side effects may I notice from receiving this medicine? Side effects that you should report to your doctor or health care professional as soon as possible: -allergic reactions like skin rash, itching or hives, swelling of the face, lips, or tongue -breathing problems -changes in heartbeat -fever, chills -pain, redness or swelling at the injection site -signs of viral infection including fever, drowsiness, chills, runny nose followed in about 2 weeks by a rash and joint pain -tightness in the chest Side effects that usually do not require medical attention (report to your doctor or health care professional if they continue or are bothersome): -increased salivation -nausea, vomiting This list may not describe all possible side effects. Call your doctor for medical advice about side effects. You may report side effects to FDA at 1-800-FDA-1088. Where should I keep my medicine? This does not apply. You will not be given this medicine to store at home. NOTE: This sheet is a summary. It may not cover all possible information. If you have questions about this medicine, talk to your doctor, pharmacist, or health care provider.    2016, Elsevier/Gold Standard. (2007-09-16 10:18:55)

## 2015-07-24 NOTE — Telephone Encounter (Signed)
Pt aware.

## 2015-07-24 NOTE — Procedures (Signed)
Successful US guided paracentesis from RLQ.  Yielded 8 liters of clear yellow fluid.  No immediate complications.  Pt tolerated well.   Specimen was sent for labs.  Alexsys Eskin S Fedor Kazmierski PA-C 07/24/2015 10:07 AM

## 2015-07-25 ENCOUNTER — Inpatient Hospital Stay (HOSPITAL_COMMUNITY)
Admission: EM | Admit: 2015-07-25 | Discharge: 2015-07-31 | DRG: 441 | Disposition: A | Discharge status: Home / Self Care | Payer: Managed Care, Other (non HMO) | Attending: Internal Medicine | Admitting: Internal Medicine

## 2015-07-25 ENCOUNTER — Encounter (HOSPITAL_COMMUNITY): Payer: Self-pay | Admitting: Emergency Medicine

## 2015-07-25 ENCOUNTER — Telehealth: Payer: Self-pay | Admitting: Internal Medicine

## 2015-07-25 DIAGNOSIS — K7290 Hepatic failure, unspecified without coma: Secondary | ICD-10-CM

## 2015-07-25 DIAGNOSIS — K5730 Diverticulosis of large intestine without perforation or abscess without bleeding: Secondary | ICD-10-CM | POA: Diagnosis present

## 2015-07-25 DIAGNOSIS — E1140 Type 2 diabetes mellitus with diabetic neuropathy, unspecified: Secondary | ICD-10-CM | POA: Diagnosis present

## 2015-07-25 DIAGNOSIS — E1121 Type 2 diabetes mellitus with diabetic nephropathy: Secondary | ICD-10-CM | POA: Diagnosis present

## 2015-07-25 DIAGNOSIS — Z792 Long term (current) use of antibiotics: Secondary | ICD-10-CM | POA: Diagnosis not present

## 2015-07-25 DIAGNOSIS — K219 Gastro-esophageal reflux disease without esophagitis: Secondary | ICD-10-CM | POA: Diagnosis present

## 2015-07-25 DIAGNOSIS — D638 Anemia in other chronic diseases classified elsewhere: Secondary | ICD-10-CM

## 2015-07-25 DIAGNOSIS — K429 Umbilical hernia without obstruction or gangrene: Secondary | ICD-10-CM | POA: Diagnosis present

## 2015-07-25 DIAGNOSIS — E43 Unspecified severe protein-calorie malnutrition: Secondary | ICD-10-CM | POA: Diagnosis present

## 2015-07-25 DIAGNOSIS — E1122 Type 2 diabetes mellitus with diabetic chronic kidney disease: Secondary | ICD-10-CM | POA: Diagnosis present

## 2015-07-25 DIAGNOSIS — E872 Acidosis: Secondary | ICD-10-CM | POA: Diagnosis present

## 2015-07-25 DIAGNOSIS — Z833 Family history of diabetes mellitus: Secondary | ICD-10-CM

## 2015-07-25 DIAGNOSIS — Z794 Long term (current) use of insulin: Secondary | ICD-10-CM | POA: Diagnosis not present

## 2015-07-25 DIAGNOSIS — Z8249 Family history of ischemic heart disease and other diseases of the circulatory system: Secondary | ICD-10-CM | POA: Diagnosis not present

## 2015-07-25 DIAGNOSIS — K7581 Nonalcoholic steatohepatitis (NASH): Secondary | ICD-10-CM | POA: Diagnosis present

## 2015-07-25 DIAGNOSIS — N179 Acute kidney failure, unspecified: Secondary | ICD-10-CM | POA: Diagnosis present

## 2015-07-25 DIAGNOSIS — D689 Coagulation defect, unspecified: Secondary | ICD-10-CM

## 2015-07-25 DIAGNOSIS — D696 Thrombocytopenia, unspecified: Secondary | ICD-10-CM | POA: Diagnosis not present

## 2015-07-25 DIAGNOSIS — Z87442 Personal history of urinary calculi: Secondary | ICD-10-CM | POA: Diagnosis not present

## 2015-07-25 DIAGNOSIS — I129 Hypertensive chronic kidney disease with stage 1 through stage 4 chronic kidney disease, or unspecified chronic kidney disease: Secondary | ICD-10-CM | POA: Diagnosis present

## 2015-07-25 DIAGNOSIS — K222 Esophageal obstruction: Secondary | ICD-10-CM | POA: Diagnosis present

## 2015-07-25 DIAGNOSIS — F329 Major depressive disorder, single episode, unspecified: Secondary | ICD-10-CM

## 2015-07-25 DIAGNOSIS — F32A Depression, unspecified: Secondary | ICD-10-CM | POA: Diagnosis present

## 2015-07-25 DIAGNOSIS — K744 Secondary biliary cirrhosis: Secondary | ICD-10-CM | POA: Insufficient documentation

## 2015-07-25 DIAGNOSIS — K7682 Hepatic encephalopathy: Secondary | ICD-10-CM | POA: Diagnosis present

## 2015-07-25 DIAGNOSIS — K7200 Acute and subacute hepatic failure without coma: Secondary | ICD-10-CM | POA: Diagnosis present

## 2015-07-25 DIAGNOSIS — I8501 Esophageal varices with bleeding: Secondary | ICD-10-CM | POA: Diagnosis present

## 2015-07-25 DIAGNOSIS — N183 Chronic kidney disease, stage 3 (moderate): Secondary | ICD-10-CM | POA: Diagnosis not present

## 2015-07-25 DIAGNOSIS — Z86010 Personal history of colonic polyps: Secondary | ICD-10-CM | POA: Diagnosis not present

## 2015-07-25 DIAGNOSIS — D61818 Other pancytopenia: Secondary | ICD-10-CM | POA: Diagnosis present

## 2015-07-25 DIAGNOSIS — Z8661 Personal history of infections of the central nervous system: Secondary | ICD-10-CM

## 2015-07-25 DIAGNOSIS — E1340 Other specified diabetes mellitus with diabetic neuropathy, unspecified: Secondary | ICD-10-CM

## 2015-07-25 DIAGNOSIS — E860 Dehydration: Secondary | ICD-10-CM | POA: Diagnosis present

## 2015-07-25 DIAGNOSIS — K7460 Unspecified cirrhosis of liver: Secondary | ICD-10-CM | POA: Diagnosis present

## 2015-07-25 DIAGNOSIS — N189 Chronic kidney disease, unspecified: Secondary | ICD-10-CM

## 2015-07-25 DIAGNOSIS — R188 Other ascites: Secondary | ICD-10-CM | POA: Diagnosis not present

## 2015-07-25 LAB — COMPREHENSIVE METABOLIC PANEL
ALK PHOS: 57 U/L (ref 38–126)
ALT: 20 U/L (ref 17–63)
ALT: 19 U/L (ref 17–63)
ANION GAP: 11 (ref 5–15)
ANION GAP: 11 (ref 5–15)
AST: 24 U/L (ref 15–41)
AST: 25 U/L (ref 15–41)
Albumin: 3.7 g/dL (ref 3.5–5.0)
Albumin: 3.9 g/dL (ref 3.5–5.0)
Alkaline Phosphatase: 60 U/L (ref 38–126)
BILIRUBIN TOTAL: 2 mg/dL — AB (ref 0.3–1.2)
BUN: 38 mg/dL — ABNORMAL HIGH (ref 6–20)
BUN: 39 mg/dL — AB (ref 6–20)
CALCIUM: 10 mg/dL (ref 8.9–10.3)
CHLORIDE: 102 mmol/L (ref 101–111)
CO2: 21 mmol/L — AB (ref 22–32)
CO2: 23 mmol/L (ref 22–32)
Calcium: 10 mg/dL (ref 8.9–10.3)
Chloride: 101 mmol/L (ref 101–111)
Creatinine, Ser: 2.44 mg/dL — ABNORMAL HIGH (ref 0.61–1.24)
Creatinine, Ser: 2.34 mg/dL — ABNORMAL HIGH (ref 0.61–1.24)
GFR calc Af Amer: 33 mL/min — ABNORMAL LOW (ref >60)
GFR, EST AFRICAN AMERICAN: 31 mL/min — AB (ref >60)
GFR, EST NON AFRICAN AMERICAN: 27 mL/min — AB (ref >60)
GFR, EST NON AFRICAN AMERICAN: 28 mL/min — AB (ref >60)
Glucose, Bld: 189 mg/dL — ABNORMAL HIGH (ref 65–99)
Glucose, Bld: 159 mg/dL — ABNORMAL HIGH (ref 65–99)
POTASSIUM: 5.1 mmol/L (ref 3.5–5.1)
Potassium: 4.7 mmol/L (ref 3.5–5.1)
SODIUM: 133 mmol/L — AB (ref 135–145)
Sodium: 136 mmol/L (ref 135–145)
TOTAL PROTEIN: 6.8 g/dL (ref 6.5–8.1)
Total Bilirubin: 1.6 mg/dL — ABNORMAL HIGH (ref 0.3–1.2)
Total Protein: 6.5 g/dL (ref 6.5–8.1)

## 2015-07-25 LAB — CBC WITH DIFFERENTIAL/PLATELET
BASOS ABS: 0 10*3/uL (ref 0.0–0.1)
BASOS ABS: 0 10*3/uL (ref 0.0–0.1)
BASOS PCT: 0 %
Basophils Relative: 0 %
EOS ABS: 0 10*3/uL (ref 0.0–0.7)
EOS ABS: 0 10*3/uL (ref 0.0–0.7)
EOS PCT: 1 %
Eosinophils Relative: 1 %
HEMATOCRIT: 29.4 % — AB (ref 39.0–52.0)
HEMATOCRIT: 29.8 % — AB (ref 39.0–52.0)
HEMOGLOBIN: 10.3 g/dL — AB (ref 13.0–17.0)
Hemoglobin: 10.3 g/dL — ABNORMAL LOW (ref 13.0–17.0)
Lymphocytes Relative: 16 %
Lymphocytes Relative: 15 %
Lymphs Abs: 0.5 10*3/uL — ABNORMAL LOW (ref 0.7–4.0)
Lymphs Abs: 0.5 10*3/uL — ABNORMAL LOW (ref 0.7–4.0)
MCH: 31.4 pg (ref 26.0–34.0)
MCH: 31.1 pg (ref 26.0–34.0)
MCHC: 35 g/dL (ref 30.0–36.0)
MCHC: 34.6 g/dL (ref 30.0–36.0)
MCV: 89.6 fL (ref 78.0–100.0)
MCV: 90 fL (ref 78.0–100.0)
MONOS PCT: 6 %
Monocytes Absolute: 0.2 10*3/uL (ref 0.1–1.0)
Monocytes Absolute: 0.2 10*3/uL (ref 0.1–1.0)
Monocytes Relative: 6 %
NEUTROS ABS: 2.6 10*3/uL (ref 1.7–7.7)
NEUTROS ABS: 2.4 10*3/uL (ref 1.7–7.7)
NEUTROS PCT: 78 %
Neutrophils Relative %: 77 %
Platelets: 24 10*3/uL — CL (ref 150–400)
Platelets: 25 10*3/uL — CL (ref 150–400)
RBC: 3.28 MIL/uL — ABNORMAL LOW (ref 4.22–5.81)
RBC: 3.31 MIL/uL — AB (ref 4.22–5.81)
RDW: 15.7 % — AB (ref 11.5–15.5)
RDW: 15.8 % — ABNORMAL HIGH (ref 11.5–15.5)
WBC: 3.3 10*3/uL — ABNORMAL LOW (ref 4.0–10.5)
WBC: 3.1 10*3/uL — AB (ref 4.0–10.5)

## 2015-07-25 LAB — PROTIME-INR
INR: 1.73 — AB (ref 0.00–1.49)
INR: 1.6 — AB (ref 0.00–1.49)
PROTHROMBIN TIME: 19.1 s — AB (ref 11.6–15.2)
Prothrombin Time: 20.2 seconds — ABNORMAL HIGH (ref 11.6–15.2)

## 2015-07-25 LAB — AMMONIA: AMMONIA: 144 umol/L — AB (ref 9–35)

## 2015-07-25 LAB — GLUCOSE, CAPILLARY: GLUCOSE-CAPILLARY: 145 mg/dL — AB (ref 65–99)

## 2015-07-25 LAB — BRAIN NATRIURETIC PEPTIDE: B NATRIURETIC PEPTIDE 5: 75.7 pg/mL (ref 0.0–100.0)

## 2015-07-25 LAB — TSH: TSH: 1.748 u[IU]/mL (ref 0.350–4.500)

## 2015-07-25 LAB — I-STAT CG4 LACTIC ACID, ED: Lactic Acid, Venous: 3.18 mmol/L (ref 0.5–2.0)

## 2015-07-25 LAB — APTT: APTT: 37 s (ref 24–37)

## 2015-07-25 LAB — ETHANOL

## 2015-07-25 LAB — LIPASE, BLOOD: LIPASE: 55 U/L — AB (ref 11–51)

## 2015-07-25 LAB — PHOSPHORUS: PHOSPHORUS: 3.6 mg/dL (ref 2.5–4.6)

## 2015-07-25 LAB — MAGNESIUM: MAGNESIUM: 2.2 mg/dL (ref 1.7–2.4)

## 2015-07-25 MED ORDER — SPIRONOLACTONE 25 MG PO TABS
100.0000 mg | ORAL_TABLET | Freq: Two times a day (BID) | ORAL | Status: DC
  Administered 2015-07-25: 100 mg via ORAL
  Filled 2015-07-25: qty 4

## 2015-07-25 MED ORDER — FLUOXETINE HCL 20 MG PO TABS
10.0000 mg | ORAL_TABLET | Freq: Every day | ORAL | Status: DC
  Filled 2015-07-25: qty 1

## 2015-07-25 MED ORDER — LACTULOSE ENEMA
300.0000 mL | Freq: Three times a day (TID) | ORAL | Status: DC
  Filled 2015-07-25 (×2): qty 300

## 2015-07-25 MED ORDER — BACLOFEN 10 MG PO TABS
20.0000 mg | ORAL_TABLET | Freq: Every day | ORAL | Status: DC
  Administered 2015-07-25 – 2015-07-30 (×6): 20 mg via ORAL
  Filled 2015-07-25 (×6): qty 2

## 2015-07-25 MED ORDER — ACETAMINOPHEN 650 MG RE SUPP: 650.0000 mg | Freq: Four times a day (QID) | RECTAL | Status: DC | PRN

## 2015-07-25 MED ORDER — VITAMIN D (ERGOCALCIFEROL) 1.25 MG (50000 UNIT) PO CAPS
50000.0000 [IU] | ORAL_CAPSULE | ORAL | Status: DC
  Administered 2015-07-31: 50000 [IU] via ORAL
  Filled 2015-07-25: qty 1

## 2015-07-25 MED ORDER — PANTOPRAZOLE SODIUM 40 MG PO TBEC
40.0000 mg | DELAYED_RELEASE_TABLET | Freq: Every day | ORAL | Status: DC
Start: 2015-07-25 — End: 2015-07-31
  Administered 2015-07-25 – 2015-07-31 (×7): 40 mg via ORAL
  Filled 2015-07-25 (×7): qty 1

## 2015-07-25 MED ORDER — ADULT MULTIVITAMIN W/MINERALS CH
1.0000 | ORAL_TABLET | Freq: Every day | ORAL | Status: DC
  Administered 2015-07-25 – 2015-07-31 (×7): 1 via ORAL
  Filled 2015-07-25 (×7): qty 1

## 2015-07-25 MED ORDER — ONDANSETRON HCL 4 MG PO TABS: 4.0000 mg | ORAL_TABLET | Freq: Four times a day (QID) | ORAL | Status: DC | PRN

## 2015-07-25 MED ORDER — INSULIN ASPART 100 UNIT/ML ~~LOC~~ SOLN
0.0000 [IU] | Freq: Every day | SUBCUTANEOUS | Status: DC
  Administered 2015-07-28: 3 [IU] via SUBCUTANEOUS
  Administered 2015-07-30: 2 [IU] via SUBCUTANEOUS

## 2015-07-25 MED ORDER — ONDANSETRON HCL 4 MG/2ML IJ SOLN
4.0000 mg | Freq: Four times a day (QID) | INTRAMUSCULAR | Status: DC | PRN
  Administered 2015-07-25: 4 mg via INTRAVENOUS
  Filled 2015-07-25: qty 2

## 2015-07-25 MED ORDER — ONDANSETRON HCL 4 MG/2ML IJ SOLN: 4.0000 mg | Freq: Three times a day (TID) | INTRAMUSCULAR | Status: DC | PRN

## 2015-07-25 MED ORDER — TAMSULOSIN HCL 0.4 MG PO CAPS
0.4000 mg | ORAL_CAPSULE | Freq: Every day | ORAL | Status: DC
  Administered 2015-07-26 – 2015-07-30 (×5): 0.4 mg via ORAL
  Filled 2015-07-25 (×5): qty 1

## 2015-07-25 MED ORDER — INSULIN DEGLUDEC 200 UNIT/ML ~~LOC~~ SOPN
20.0000 [IU] | PEN_INJECTOR | Freq: Every day | SUBCUTANEOUS | Status: DC
  Administered 2015-07-27 – 2015-07-31 (×5): 20 [IU] via SUBCUTANEOUS

## 2015-07-25 MED ORDER — AMOXICILLIN 250 MG PO CAPS
500.0000 mg | ORAL_CAPSULE | Freq: Three times a day (TID) | ORAL | Status: DC
  Administered 2015-07-25 – 2015-07-31 (×17): 500 mg via ORAL
  Filled 2015-07-25 (×17): qty 2

## 2015-07-25 MED ORDER — SODIUM CHLORIDE 0.9 % IV SOLN
INTRAVENOUS | Status: DC
  Administered 2015-07-25: via INTRAVENOUS

## 2015-07-25 MED ORDER — INSULIN ASPART 100 UNIT/ML ~~LOC~~ SOLN
0.0000 [IU] | Freq: Three times a day (TID) | SUBCUTANEOUS | Status: DC
  Administered 2015-07-26: 2 [IU] via SUBCUTANEOUS
  Administered 2015-07-26: 3 [IU] via SUBCUTANEOUS
  Administered 2015-07-27 (×2): 2 [IU] via SUBCUTANEOUS
  Administered 2015-07-27: 5 [IU] via SUBCUTANEOUS
  Administered 2015-07-28 (×2): 3 [IU] via SUBCUTANEOUS
  Administered 2015-07-29: 5 [IU] via SUBCUTANEOUS
  Administered 2015-07-29: 3 [IU] via SUBCUTANEOUS
  Administered 2015-07-29 – 2015-07-30 (×2): 8 [IU] via SUBCUTANEOUS
  Administered 2015-07-30: 5 [IU] via SUBCUTANEOUS
  Administered 2015-07-30: 2 [IU] via SUBCUTANEOUS
  Administered 2015-07-31: 3 [IU] via SUBCUTANEOUS
  Administered 2015-07-31: 8 [IU] via SUBCUTANEOUS

## 2015-07-25 MED ORDER — GABAPENTIN 300 MG PO CAPS
300.0000 mg | ORAL_CAPSULE | Freq: Three times a day (TID) | ORAL | Status: DC
  Administered 2015-07-25 – 2015-07-29 (×12): 300 mg via ORAL
  Filled 2015-07-25 (×12): qty 1

## 2015-07-25 MED ORDER — BETHANECHOL CHLORIDE 25 MG PO TABS
50.0000 mg | ORAL_TABLET | Freq: Two times a day (BID) | ORAL | Status: DC
  Administered 2015-07-25 – 2015-07-31 (×12): 50 mg via ORAL
  Filled 2015-07-25 (×13): qty 2

## 2015-07-25 MED ORDER — LACTULOSE 10 GM/15ML PO SOLN
20.0000 g | Freq: Three times a day (TID) | ORAL | Status: DC
  Administered 2015-07-25 (×2): 20 g via ORAL
  Filled 2015-07-25 (×2): qty 30

## 2015-07-25 MED ORDER — SODIUM CHLORIDE 0.9 % IV SOLN
INTRAVENOUS | Status: DC
Start: 2015-07-25 — End: 2015-07-26

## 2015-07-25 MED ORDER — FLUCONAZOLE 100 MG PO TABS
100.0000 mg | ORAL_TABLET | Freq: Every day | ORAL | Status: DC
  Administered 2015-07-26 – 2015-07-31 (×6): 100 mg via ORAL
  Filled 2015-07-25 (×6): qty 1

## 2015-07-25 MED ORDER — ACETAMINOPHEN 325 MG PO TABS: 650.0000 mg | ORAL_TABLET | Freq: Four times a day (QID) | ORAL | Status: DC | PRN

## 2015-07-25 MED ORDER — RIFAXIMIN 550 MG PO TABS
550.0000 mg | ORAL_TABLET | Freq: Two times a day (BID) | ORAL | Status: DC
  Administered 2015-07-25 – 2015-07-31 (×12): 550 mg via ORAL
  Filled 2015-07-25 (×13): qty 1

## 2015-07-25 NOTE — H&P (Signed)
[facility] History and Physical  [name] [id number] DOB: [birth year] DOA: 07/25/2015  Referring physician: ER physician PCP: [name], MD  Chief Complaint: altered mental status   HPI:  65 y.o. 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 He is on chronic amoxicillin for this. Pt presented to [facility] ED with worsening mental status changes over past day or so associated with lethargy and confusion, weakness, nausea and vomiting and poor po intake. No fevers. His abdomen was more distended but no reports of abdominal pain. No diarrhea or constipation. No reports of blood in stool or urine. No loss of consciousness.  Per GI note, pt was most recently evaluated by Emilio Math on 07/04/2015 at [facility]. It was felt that transplantation cannot be considered while he is requiring suppressive antibiotic therapy for a chronic infectious process as infection as an exclusionary contraindication for transplant. He was advised to follow-up at [facility] in 3 months  In ED, BP was 122/59, HR 79, RR 18, Oxygen saturaiton 100%. Blood work was notable for WBC count 3.3, hemoglobin 10.3, paltelets 24, creatinine 2.44, INR 1.73 and lactic acid 3.18, ammonia was 144. He had paracentesis 1/17 with 8 L fluid removed at that time.   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  - No reports of bleeding - Follow up daily CBC    Ascites - S/P paracentesis 1/17 with 8 L fluid removed    Acute renal failure superimposed on stage 3 chronic kidney disease (HCC) - Baseline Cr 1.2 and on this admission Cr higher at 2.44 which could be due to dehdyration or large volume ascites or hepatorenal syndrome - Monitor renal function     Anemia of chronic disease - Due to history  Of liver cirrhosis - Hemoglobin is 10.3, stable    Depression - Continue Prozac    Controlled type 2 diabetes mellitus with diabetic nephropathy, without long-term current use of insulin (HCC) / Diabetic neuropathy - Recent A1c in 04/2015 was 6.4 - Resume insulin at lower dose than what he takes at hoem since he is NPO due to altered mental status - Also start SSI  DVT prophylaxis:  - SCD's bilaterally due to thrombocytopenia   Radiological Exams on Admission: US Paracentesis 07/24/2015  Successful ultrasound-guided paracentesis yielding 8 liters of peritoneal fluid. Read by:  Gareth Eagle, PA-C Electronically Signed   By: Marybelle Killings M.D.   On: 07/24/2015 10:07    Code Status: Full Family Communication: no family at the bedside  Disposition Plan: Admit for further evaluation, telemetry   Jabriel Vanduyne, Dedra Skeens, MD  [facility] Pager [phone]  Time spent in minutes: 75 minutes  Review of Systems:  Constitutional: Negative for fever, chills and malaise/fatigue. Negative for diaphoresis.  HENT: Negative for hearing loss,  ear pain, nosebleeds, congestion, sore throat, neck pain, tinnitus and ear discharge.   Eyes: Negative for blurred vision, double vision, photophobia, pain, discharge and redness.  Respiratory: Negative for cough, hemoptysis, sputum production, shortness of breath, wheezing and stridor.   Cardiovascular: Negative for chest pain, palpitations, orthopnea, claudication  and leg swelling.  Gastrointestinal: Negative for nausea, vomiting and abdominal pain. Negative for heartburn, constipation, blood in stool and melena.  Genitourinary: Negative for dysuria, urgency, frequency, hematuria and flank pain.  Musculoskeletal: Negative for myalgias, back pain, joint pain and falls.  Skin: Negative for itching and rash.  Neurological: Negative for dizziness and weakness. Negative for tingling, tremors, sensory change, speech change, focal weakness, loss of consciousness and headaches.  Endo/Heme/Allergies: Negative for environmental allergies and polydipsia. Does not bruise/bleed easily.  Psychiatric/Behavioral: Negative for suicidal ideas. The patient is not nervous/anxious.      Past Medical History  Diagnosis Date  . Reflux esophagitis   . Portal hypertension ([place])   . Stricture and stenosis of esophagus   . Diverticulosis of colon (without mention of hemorrhage)   . Internal hemorrhoids without mention of complication   . Benign neoplasm of colon   . Duodenitis without mention of hemorrhage   . 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 Personal history of diseases of blood and blood-forming organs   . 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   Marland Kitchen Unspecified essential hypertension   . Obesity, unspecified   . Type II or unspecified type diabetes mellitus without mention of complication, not stated as uncontrolled   . GERD (gastroesophageal reflux disease)   . Hemorrhoids   . Colon polyps 2004, 2006, 2008, 11/2011    Adenomatous and tubulovillous adenoma.  . Pancytopenia   . Calculus of kidney yrs ago  . Ascites    Past Surgical History  Procedure Laterality Date  . Kidney stone surgery  yrs ago  . Esophagogastroduodenoscopy  08/01/2011    Procedure: ESOPHAGOGASTRODUODENOSCOPY (EGD);  Surgeon: Scarlette Shorts, MD;  Location: [facility];  Service: Endoscopy;  Laterality: N/A;  . Esophagogastroduodenoscopy N/A 10/27/2012     Procedure: ESOPHAGOGASTRODUODENOSCOPY (EGD);  Surgeon: Inda Castle, MD;  Location: [facility];  Service: Endoscopy;  Laterality: N/A;  . Esophagogastroduodenoscopy N/A 11/22/2012    Procedure: ESOPHAGOGASTRODUODENOSCOPY (EGD);  Surgeon: Irene Shipper, MD;  Location: [facility];  Service: Endoscopy;  Laterality: N/A;  . Colonoscopy    . Upper gastrointestinal endoscopy    . Esophagogastroduodenoscopy N/A 11/29/2013    Procedure: ESOPHAGOGASTRODUODENOSCOPY (EGD);  Surgeon: Irene Shipper, MD;  Location: [facility];  Service: Endoscopy;  Laterality: N/A;  . Esophageal banding Bilateral 11/29/2013    Procedure: ESOPHAGEAL BANDING;  Surgeon: Irene Shipper, MD;  Location: [facility];  Service: Endoscopy;  Laterality: Bilateral;  . Esophagogastroduodenoscopy N/A 06/13/2014    Procedure: ESOPHAGOGASTRODUODENOSCOPY (EGD);  Surgeon: Irene Shipper, MD;  Location: [facility];  Service: Endoscopy;  Laterality: N/A;  . Esophageal banding N/A 06/13/2014    Procedure: ESOPHAGEAL BANDING;  Surgeon: Irene Shipper, MD;  Location: [facility] ENDOSCOPY;  Service: Endoscopy;  Laterality: N/A;   Social History:  reports that he has never smoked. He has never used smokeless tobacco. He reports that he does not drink alcohol or use illicit drugs.  No Known Allergies  Family History:  Family History  Problem Relation Age of Onset  . Colon cancer Neg Hx   . Anesthesia problems Neg Hx   . Hypotension Neg Hx   .  Malignant hyperthermia Neg Hx   . Pseudochol deficiency Neg Hx   . Diabetes Father   . Heart disease Father   . Aneurysm Mother   . Heart disease Other     neice  . Diabetes      niece     Prior to Admission medications   Medication Sig Start Date End Date Taking? Authorizing Provider  amoxicillin (AMOXIL) 500 MG capsule Take 1 capsule (500 mg total) by mouth 3 (three) times daily. 01/16/15  Yes Truman Hayward, MD  baclofen (LIORESAL) 20 MG tablet Take 1 tablet (20 mg total) by mouth at bedtime.  06/11/15  Yes Meredith Staggers, MD  bethanechol (URECHOLINE) 50 MG tablet Take 1 tablet by mouth 2 (two) times daily.  12/19/14  Yes Historical Provider, MD  fluconazole (DIFLUCAN) 100 MG tablet Take 1 tablet by mouth daily. 07/19/15  Yes Historical Provider, MD  FLUoxetine (PROZAC) 10 MG tablet Take 10 mg by mouth daily.   Yes Historical Provider, MD  furosemide (LASIX) 20 MG tablet Take 1 tablet (20 mg total) by mouth 2 (two) times daily. 07/13/15  Yes Robbie Lis, MD  gabapentin (NEURONTIN) 100 MG capsule Take 300 mg by mouth 3 (three) times daily.    Yes Historical Provider, MD  Insulin Degludec (TRESIBA FLEXTOUCH) 200 UNIT/ML SOPN Inject 45 Units into the skin daily. 07/13/15  Yes Robbie Lis, MD  lactulose (CHRONULAC) 10 GM/15ML solution Take 6 tablespoons 4 times daily. 06/18/15  Yes Irene Shipper, MD  Multiple Vitamin (MULTIVITAMIN WITH MINERALS) TABS tablet Take 1 tablet by mouth daily.   Yes Historical Provider, MD  pantoprazole (PROTONIX) 40 MG tablet TAKE 1 TABLET (40 MG TOTAL) BY MOUTH DAILY. 11/13/14  Yes Irene Shipper, MD  spironolactone (ALDACTONE) 100 MG tablet Take 100 mg by mouth 2 (two) times daily.   Yes Historical Provider, MD  tamsulosin (FLOMAX) 0.4 MG CAPS capsule Take 1 capsule by mouth daily. Take once daily by mouth 30 minutes after supper 12/19/14  Yes Historical Provider, MD  Vitamin D, Ergocalciferol, (DRISDOL) 50000 UNITS CAPS capsule Take 50,000 Units by mouth every 7 (seven) days. Mondays. 03/29/15  Yes Historical Provider, MD  XIFAXAN 550 MG TABS tablet TAKE 1 TABLET (550 MG TOTAL) BY MOUTH 2 (TWO) TIMES DAILY. 06/27/15  Yes Irene Shipper, MD  insulin aspart (NOVOLOG FLEXPEN) 100 UNIT/ML FlexPen CBG 121 - 150: 1 unit    CBG 151 - 200: 2 units    CBG 201 - 250: 3 units    CBG 251 - 300: 5 units    CBG 301 - 350: 7 units    CBG 351 - 400 9 units Patient not taking: Reported on 07/25/2015 07/13/15   Robbie Lis, MD   Physical Exam: Filed Vitals:   07/25/15 1152   BP: 97/70  Pulse: 72  Temp: 97.9 F (36.6 C)  TempSrc: Oral  Resp: 18  SpO2: 100%    Physical Exam  Constitutional: Appears well-developed and well-nourished. No distress.  HENT: Normocephalic. No tonsillar erythema or exudates Eyes: Conjunctivae are normal. No scleral icterus.  Neck: Normal ROM. Neck supple. No JVD. No tracheal deviation. No thyromegaly.  CVS: RRR, S1/S2 +, no murmurs, no gallops, no carotid bruit.  Pulmonary: Effort and breath sounds normal, no stridor, rhonchi, wheezes, rales.  Abdominal: Soft. BS +,  appreciate distension, no tenderness, rebound or guarding.  Musculoskeletal: Normal range of motion. No tenderness.  Lymphadenopathy: No lymphadenopathy noted, cervical, inguinal.  Neuro: Alert. Normal reflexes, muscle tone coordination. No focal neurologic deficits. Skin: Skin is warm and dry. No rash noted.  No erythema. No pallor.  Psychiatric: Normal mood and affect. Behavior, judgment, thought content normal.   Labs on Admission:  Basic Metabolic Panel:  Recent Labs Lab 07/25/15 1144  NA 133*  K 4.7  CL 101  CO2 21*  GLUCOSE 189*  BUN 38*  CREATININE 2.44*  CALCIUM 10.0   Liver Function Tests:  Recent Labs Lab 07/25/15 1144  AST 24  ALT 20  ALKPHOS 57  BILITOT 1.6*  PROT 6.5  ALBUMIN 3.7    Recent Labs Lab 07/25/15 1144  LIPASE 55*    Recent Labs Lab 07/25/15 1143  AMMONIA 144*   CBC:  Recent Labs Lab 07/25/15 1144  WBC 3.3*  NEUTROABS 2.6  HGB 10.3*  HCT 29.4*  MCV 89.6  PLT 24*   Cardiac Enzymes: No results for input(s): CKTOTAL, CKMB, CKMBINDEX, TROPONINI in the last 168 hours. BNP: Invalid input(s): POCBNP CBG: No results for input(s): GLUCAP in the last 168 hours.  If 7PM-7AM, please contact night-coverage [email or web address] Password [email or web address] 07/25/2015, 1:51 PM

## 2015-07-25 NOTE — Telephone Encounter (Signed)
Agree with plan as outlined.

## 2015-07-25 NOTE — Telephone Encounter (Signed)
Pt had paracentesis done yesterday. Wife states pt started throwing up last night and has not been able to stop vomiting. Home health nurse is there and pts BP is 82/56. They are calling EMS to take pt to [facility]. [name] notifed.

## 2015-07-25 NOTE — Progress Notes (Signed)
ED CM called LVM for medical director

## 2015-07-25 NOTE — Progress Notes (Signed)
Pt was set up by unit Cm with  HH Arranged: RN, PT, OT, Nurse's Aide HH Agency: [facility] On 07/13/15, last d/c date   Not an available [facility] in house representative to notify to follow for d/c needs  ED Cm reviewed chart and noted that [facility] HHRN with pt on 07/23/15

## 2015-07-25 NOTE — ED Notes (Signed)
Per EMS pt from home, spouse reports decreased loc today. Hx cirrhosis , pt was seen yesterday and had paracentesis, 2 gallons output. Per ems pt also reports emesis post procedure. Pt was repetitive off all answers to ems. Pt oriented to self.  Pt was actively vomiting upon arrival.

## 2015-07-25 NOTE — ED Provider Notes (Signed)
CSN: [id number]     Arrival date & time 07/25/15  1110 History   First MD Initiated Contact with Patient 07/25/15 1114     Chief Complaint  Patient presents with  . Altered Mental Status    HPI  Patient presents with his wife was provided the history of present illness. Patient is confused, disoriented, level V caveat. He has a known history of cirrhosis, works with specialist at [employer], trying to strengthen for candidate for liver transplant. Patient had paracentesis yesterday, which he describes as removing 4 gallons of fluid. Actual amounts of clear. With state that since yesterday he has had worsening confusion, disorientation, multiple episodes of vomiting. No report of fever, falling. Patient himself is disoriented, but seems to deny pain. He does acknowledge nausea.   Past Medical History  Diagnosis Date  . Reflux esophagitis   . Portal hypertension ([place])   . Stricture and stenosis of esophagus   . Diverticulosis of colon (without mention of hemorrhage)   . Internal hemorrhoids without mention of complication   . Benign neoplasm of colon   . Duodenitis without mention of hemorrhage   . 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 Personal history of diseases of blood and blood-forming organs   . 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   Marland Kitchen Unspecified essential hypertension   . Obesity, unspecified   . Type II or unspecified type diabetes mellitus without mention of complication, not stated as uncontrolled   . GERD (gastroesophageal reflux disease)   . Hemorrhoids   . Colon polyps 2004, 2006, 2008, 11/2011    Adenomatous and tubulovillous adenoma.  . Pancytopenia   . Calculus of kidney yrs ago  . Ascites    Past Surgical History  Procedure Laterality Date  . Kidney stone surgery  yrs ago  . Esophagogastroduodenoscopy  08/01/2011    Procedure: ESOPHAGOGASTRODUODENOSCOPY (EGD);  Surgeon: Scarlette Shorts, MD;  Location: [facility];   Service: Endoscopy;  Laterality: N/A;  . Esophagogastroduodenoscopy N/A 10/27/2012    Procedure: ESOPHAGOGASTRODUODENOSCOPY (EGD);  Surgeon: Inda Castle, MD;  Location: [facility];  Service: Endoscopy;  Laterality: N/A;  . Esophagogastroduodenoscopy N/A 11/22/2012    Procedure: ESOPHAGOGASTRODUODENOSCOPY (EGD);  Surgeon: Irene Shipper, MD;  Location: [facility];  Service: Endoscopy;  Laterality: N/A;  . Colonoscopy    . Upper gastrointestinal endoscopy    . Esophagogastroduodenoscopy N/A 11/29/2013    Procedure: ESOPHAGOGASTRODUODENOSCOPY (EGD);  Surgeon: Irene Shipper, MD;  Location: [facility];  Service: Endoscopy;  Laterality: N/A;  . Esophageal banding Bilateral 11/29/2013    Procedure: ESOPHAGEAL BANDING;  Surgeon: Irene Shipper, MD;  Location: [facility];  Service: Endoscopy;  Laterality: Bilateral;  . Esophagogastroduodenoscopy N/A 06/13/2014    Procedure: ESOPHAGOGASTRODUODENOSCOPY (EGD);  Surgeon: Irene Shipper, MD;  Location: [facility];  Service: Endoscopy;  Laterality: N/A;  . Esophageal banding N/A 06/13/2014    Procedure: ESOPHAGEAL BANDING;  Surgeon: Irene Shipper, MD;  Location: [facility];  Service: Endoscopy;  Laterality: N/A;   Family History  Problem Relation Age of Onset  . Colon cancer Neg Hx   . Anesthesia problems Neg Hx   . Hypotension Neg Hx   . Malignant hyperthermia Neg Hx   . Pseudochol deficiency Neg Hx   . Diabetes Father   . Heart disease Father   . Aneurysm Mother   . Heart disease Other     neice  . Diabetes      niece  Social History  Substance Use Topics  . Smoking status: Never Smoker   . Smokeless tobacco: Never Used  . Alcohol Use: No     Comment: very rare    Review of Systems  Unable to perform ROS: Mental status change  Constitutional:       Per HPI, otherwise negative  HENT:       Per HPI, otherwise negative  Respiratory:       Per HPI, otherwise negative  Cardiovascular:       Per HPI, otherwise negative   Gastrointestinal: Positive for vomiting.  Endocrine:       Negative aside from HPI  Genitourinary:       Neg aside from HPI   Musculoskeletal:       Per HPI, otherwise negative  Skin: Positive for color change.  Neurological: Positive for weakness and light-headedness.      Allergies  Review of patient's allergies indicates no known allergies.  Home Medications   Prior to Admission medications   Medication Sig Start Date End Date Taking? Authorizing Provider  amoxicillin (AMOXIL) 500 MG capsule Take 1 capsule (500 mg total) by mouth 3 (three) times daily. 01/16/15  Yes Truman Hayward, MD  baclofen (LIORESAL) 20 MG tablet Take 1 tablet (20 mg total) by mouth at bedtime. 06/11/15  Yes Meredith Staggers, MD  bethanechol (URECHOLINE) 50 MG tablet Take 1 tablet by mouth 2 (two) times daily.  12/19/14  Yes Historical Provider, MD  fluconazole (DIFLUCAN) 100 MG tablet Take 1 tablet by mouth daily. 07/19/15  Yes Historical Provider, MD  FLUoxetine (PROZAC) 10 MG tablet Take 10 mg by mouth daily.   Yes Historical Provider, MD  furosemide (LASIX) 20 MG tablet Take 1 tablet (20 mg total) by mouth 2 (two) times daily. 07/13/15  Yes Robbie Lis, MD  gabapentin (NEURONTIN) 100 MG capsule Take 300 mg by mouth 3 (three) times daily.    Yes Historical Provider, MD  Insulin Degludec (TRESIBA FLEXTOUCH) 200 UNIT/ML SOPN Inject 45 Units into the skin daily. 07/13/15  Yes Robbie Lis, MD  lactulose (CHRONULAC) 10 GM/15ML solution Take 6 tablespoons 4 times daily. 06/18/15  Yes Irene Shipper, MD  Multiple Vitamin (MULTIVITAMIN WITH MINERALS) TABS tablet Take 1 tablet by mouth daily.   Yes Historical Provider, MD  pantoprazole (PROTONIX) 40 MG tablet TAKE 1 TABLET (40 MG TOTAL) BY MOUTH DAILY. 11/13/14  Yes Irene Shipper, MD  spironolactone (ALDACTONE) 100 MG tablet Take 100 mg by mouth 2 (two) times daily.   Yes Historical Provider, MD  tamsulosin (FLOMAX) 0.4 MG CAPS capsule Take 1 capsule by mouth daily.  Take once daily by mouth 30 minutes after supper 12/19/14  Yes Historical Provider, MD  Vitamin D, Ergocalciferol, (DRISDOL) 50000 UNITS CAPS capsule Take 50,000 Units by mouth every 7 (seven) days. Mondays. 03/29/15  Yes Historical Provider, MD  XIFAXAN 550 MG TABS tablet TAKE 1 TABLET (550 MG TOTAL) BY MOUTH 2 (TWO) TIMES DAILY. 06/27/15  Yes Irene Shipper, MD  insulin aspart (NOVOLOG FLEXPEN) 100 UNIT/ML FlexPen CBG 121 - 150: 1 unit    CBG 151 - 200: 2 units    CBG 201 - 250: 3 units    CBG 251 - 300: 5 units    CBG 301 - 350: 7 units    CBG 351 - 400 9 units Patient not taking: Reported on 07/25/2015 07/13/15   Robbie Lis, MD   BP 97/70 mmHg  Pulse  72  Temp(Src) 97.9 F (36.6 C) (Oral)  Resp 18  SpO2 100% Physical Exam  Constitutional:  Morbidly obese elderly appearing male with vomit on his shirt, speaking clearly, but disoriented.  HENT:  Head: Normocephalic and atraumatic.  Eyes: Conjunctivae and EOM are normal.  Cardiovascular: Normal rate and regular rhythm.   Pulmonary/Chest: Effort normal. No stridor. No respiratory distress.  Abdominal:  Large, minimally tender abdomen  Musculoskeletal: He exhibits no edema.  Neurological: He is alert. He displays atrophy. He displays no tremor. No cranial nerve deficit. He displays no seizure activity. Coordination normal.  No asterixis  Skin: Skin is warm and dry.  Multiple small papular lesions, no confluent erythema.  Skin is vaguely jaundiced.   Psychiatric: His speech is delayed. He is slowed. Cognition and memory are impaired.  Nursing note and vitals reviewed.   ED Course  Procedures (including critical care time) Labs Review Labs Reviewed  AMMONIA - Abnormal; Notable for the following:    Ammonia 144 (*)    All other components within normal limits  PROTIME-INR - Abnormal; Notable for the following:    Prothrombin Time 20.2 (*)    INR 1.73 (*)    All other components within normal limits  COMPREHENSIVE  METABOLIC PANEL - Abnormal; Notable for the following:    Sodium 133 (*)    CO2 21 (*)    Glucose, Bld 189 (*)    BUN 38 (*)    Creatinine, Ser 2.44 (*)    Total Bilirubin 1.6 (*)    GFR calc non Af Amer 27 (*)    GFR calc Af Amer 31 (*)    All other components within normal limits  LIPASE, BLOOD - Abnormal; Notable for the following:    Lipase 55 (*)    All other components within normal limits  CBC WITH DIFFERENTIAL/PLATELET - Abnormal; Notable for the following:    WBC 3.3 (*)    RBC 3.28 (*)    Hemoglobin 10.3 (*)    HCT 29.4 (*)    RDW 15.7 (*)    Platelets 24 (*)    Lymphs Abs 0.5 (*)    All other components within normal limits  I-STAT CG4 LACTIC ACID, ED - Abnormal; Notable for the following:    Lactic Acid, Venous 3.18 (*)    All other components within normal limits  ETHANOL  BRAIN NATRIURETIC PEPTIDE    Imaging Review US Paracentesis  07/24/2015  INDICATION: Cirrhosis with recurrent large volume ascites. Request diagnostic and therapeutic paracentesis EXAM: ULTRASOUND-GUIDED PARACENTESIS COMPARISON:  None. MEDICATIONS: 1% Lidocaine COMPLICATIONS: None immediate TECHNIQUE: Informed written consent was obtained from the patient after a discussion of the risks, benefits and alternatives to treatment. A timeout was performed prior to the initiation of the procedure. Initial ultrasound scanning demonstrates a large amount of ascites within the right lower abdominal quadrant. An ultrasound image was saved for documentation purposes. The right lower abdomen was prepped and draped in the usual sterile fashion. 1% lidocaine with epinephrine was used for local anesthesia. Under direct ultrasound guidance, a 19 gauge, 7-cm, Yueh catheter was introduced. The paracentesis was performed. The catheter was removed and a dressing was applied. The patient tolerated the procedure well without immediate post procedural complication. FINDINGS: A total of approximately 8 liters of clear yellow  fluid was removed. Samples were sent to the laboratory as requested by the clinical team. IMPRESSION: Successful ultrasound-guided paracentesis yielding 8 liters of peritoneal fluid. Read by:  Gareth Eagle, PA-C Electronically Signed  By: Marybelle Killings M.D.   On: 07/24/2015 10:07   I have personally reviewed and evaluated these images and lab results as part of my medical decision-making.   Chart review notable for multiple visits for consultations of his cirrhosis.  Initial labs notable for lactic acidosis, elevated ammonia level. Patient is afebrile, no leukocytosis.   Remainder labs notable for impaired hepatic synthetic function, elevated creatinine. With evidence for hepatorenal syndrome, I discussed his case with our gastroenterology team.   Follow as a consulting team.   MDM  Patient was cirrhosis presents with disorientation. There is evidence for hepatic encephalopathy, with worsening hepatic and renal function. Patient received initial fluid resuscitation, and after discussion with our gastroenterology team, he was admitted for further evaluation and management.  Carmin Muskrat, MD 07/25/15 1332

## 2015-07-25 NOTE — Consult Note (Signed)
Referring Provider: [facility] Primary Care Physician:  [name], MD Primary Gastroenterologist:  [name]  Reason for Consultation:  Hepatic encephalopathy, possible HRS      HPI: [name] is a 65 y.o. male who is known to [name] with a history of [name], cirrhosis, portal hypertension, variceal bleeding, encephalopathy, as well as ascites. 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 He is on chronic amoxicillin. He has chronic thrombocytopenia and hence been deemed a poor operative candidate. He has a history of medical noncompliance. He had a large volume paracentesis on December 30 at which time 8 L was drained. He subsequently developed weakness and was admitted January 3 through 07/13/2015. He had a repeat large volume paracentesis on 07/24/2015, with 8 L of peritoneal fluid removed. Samples were sent to the laboratory. No evidence of SBP.  Patient's wife states that he began feeling poorly last night. He complained of weakness, nausea, and vomiting and vomited several times. This morning visiting nurse went to visit him and he was found to be hypotensive. His wife also noted that he was becoming increasingly confused. She called the GI office was advised to come to the emergency room. It should be noted that he has been evaluated at [facility] for possible transplantation. He was most recently evaluated by Emilio Math on 07/04/2015 at [facility]. It was felt that transplantation cannot be considered while he is requiring suppressive antibiotic therapy for a chronic infectious process as infection as an exclusionary contraindication for transplant. He was advised to follow-up at [facility] in 3 months.  Blood in the ER today revealed an elevated ammonia level, elevated creatinine, impaired liver function, and lactic acidosis.   Past Medical History    Diagnosis Date  . Reflux esophagitis   . Portal hypertension ([place])   . Stricture and stenosis of esophagus   . Diverticulosis of colon (without mention of hemorrhage)   . Internal hemorrhoids without mention of complication   . Benign neoplasm of colon   . Duodenitis without mention of hemorrhage   . 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 Personal history of diseases of blood and blood-forming organs   . 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   Marland Kitchen Unspecified essential hypertension   . Obesity, unspecified   . Type II or unspecified type diabetes mellitus without mention of complication, not stated as uncontrolled   . GERD (gastroesophageal reflux disease)   . Hemorrhoids   . Colon polyps 2004, 2006, 2008, 11/2011    Adenomatous and tubulovillous adenoma.  . Pancytopenia   . Calculus of kidney yrs ago  . Ascites     Past Surgical History  Procedure Laterality Date  . Kidney stone surgery  yrs ago  . Esophagogastroduodenoscopy  08/01/2011    Procedure: ESOPHAGOGASTRODUODENOSCOPY (EGD);  Surgeon: Scarlette Shorts, MD;  Location: [facility];  Service: Endoscopy;  Laterality: N/A;  . Esophagogastroduodenoscopy N/A 10/27/2012    Procedure: ESOPHAGOGASTRODUODENOSCOPY (EGD);  Surgeon: Inda Castle, MD;  Location: [facility];  Service: Endoscopy;  Laterality: N/A;  . Esophagogastroduodenoscopy N/A 11/22/2012    Procedure: ESOPHAGOGASTRODUODENOSCOPY (EGD);  Surgeon: Irene Shipper, MD;  Location: [facility];  Service: Endoscopy;  Laterality: N/A;  . Colonoscopy    . Upper gastrointestinal endoscopy    . Esophagogastroduodenoscopy N/A 11/29/2013    Procedure: ESOPHAGOGASTRODUODENOSCOPY (EGD);  Surgeon: Irene Shipper, MD;  Location: [facility];  Service: Endoscopy;  Laterality: N/A;  . Esophageal banding Bilateral 11/29/2013    Procedure: ESOPHAGEAL BANDING;  Surgeon: Irene Shipper, MD;  Location: [facility];  Service: Endoscopy;  Laterality: Bilateral;   . Esophagogastroduodenoscopy N/A 06/13/2014    Procedure: ESOPHAGOGASTRODUODENOSCOPY (EGD);  Surgeon: Irene Shipper, MD;  Location: [facility];  Service: Endoscopy;  Laterality: N/A;  . Esophageal banding N/A 06/13/2014    Procedure: ESOPHAGEAL BANDING;  Surgeon: Irene Shipper, MD;  Location: [facility];  Service: Endoscopy;  Laterality: N/A;    Prior to Admission medications   Medication Sig Start Date End Date Taking? Authorizing Provider  amoxicillin (AMOXIL) 500 MG capsule Take 1 capsule (500 mg total) by mouth 3 (three) times daily. 01/16/15  Yes Truman Hayward, MD  baclofen (LIORESAL) 20 MG tablet Take 1 tablet (20 mg total) by mouth at bedtime. 06/11/15  Yes Meredith Staggers, MD  bethanechol (URECHOLINE) 50 MG tablet Take 1 tablet by mouth 2 (two) times daily.  12/19/14  Yes Historical Provider, MD  fluconazole (DIFLUCAN) 100 MG tablet Take 1 tablet by mouth daily. 07/19/15  Yes Historical Provider, MD  FLUoxetine (PROZAC) 10 MG tablet Take 10 mg by mouth daily.   Yes Historical Provider, MD  furosemide (LASIX) 20 MG tablet Take 1 tablet (20 mg total) by mouth 2 (two) times daily. 07/13/15  Yes Robbie Lis, MD  gabapentin (NEURONTIN) 100 MG capsule Take 300 mg by mouth 3 (three) times daily.    Yes Historical Provider, MD  Insulin Degludec (TRESIBA FLEXTOUCH) 200 UNIT/ML SOPN Inject 45 Units into the skin daily. 07/13/15  Yes Robbie Lis, MD  lactulose (CHRONULAC) 10 GM/15ML solution Take 6 tablespoons 4 times daily. 06/18/15  Yes Irene Shipper, MD  Multiple Vitamin (MULTIVITAMIN WITH MINERALS) TABS tablet Take 1 tablet by mouth daily.   Yes Historical Provider, MD  pantoprazole (PROTONIX) 40 MG tablet TAKE 1 TABLET (40 MG TOTAL) BY MOUTH DAILY. 11/13/14  Yes Irene Shipper, MD  spironolactone (ALDACTONE) 100 MG tablet Take 100 mg by mouth 2 (two) times daily.   Yes Historical Provider, MD  tamsulosin (FLOMAX) 0.4 MG CAPS capsule Take 1 capsule by mouth daily. Take once daily by mouth 30  minutes after supper 12/19/14  Yes Historical Provider, MD  Vitamin D, Ergocalciferol, (DRISDOL) 50000 UNITS CAPS capsule Take 50,000 Units by mouth every 7 (seven) days. Mondays. 03/29/15  Yes Historical Provider, MD  XIFAXAN 550 MG TABS tablet TAKE 1 TABLET (550 MG TOTAL) BY MOUTH 2 (TWO) TIMES DAILY. 06/27/15  Yes Irene Shipper, MD  insulin aspart (NOVOLOG FLEXPEN) 100 UNIT/ML FlexPen CBG 121 - 150: 1 unit    CBG 151 - 200: 2 units    CBG 201 - 250: 3 units    CBG 251 - 300: 5 units    CBG 301 - 350: 7 units    CBG 351 - 400 9 units Patient not taking: Reported on 07/25/2015 07/13/15   Robbie Lis, MD    No current facility-administered medications for this encounter.   Current Outpatient Prescriptions  Medication Sig Dispense Refill  . amoxicillin (AMOXIL) 500 MG capsule Take 1 capsule (500 mg total) by mouth 3 (three) times daily. 90 capsule 11  . baclofen (LIORESAL) 20 MG tablet Take 1 tablet (20 mg total) by mouth at bedtime. 30 tablet 3  . bethanechol (URECHOLINE) 50 MG tablet Take 1 tablet by mouth  2 (two) times daily.     . fluconazole (DIFLUCAN) 100 MG tablet Take 1 tablet by mouth daily.    Marland Kitchen FLUoxetine (PROZAC) 10 MG tablet Take 10 mg by mouth daily.    . furosemide (LASIX) 20 MG tablet Take 1 tablet (20 mg total) by mouth 2 (two) times daily. 60 tablet 0  . gabapentin (NEURONTIN) 100 MG capsule Take 300 mg by mouth 3 (three) times daily.     . Insulin Degludec (TRESIBA FLEXTOUCH) 200 UNIT/ML SOPN Inject 45 Units into the skin daily. 10 mL 0  . lactulose (CHRONULAC) 10 GM/15ML solution Take 6 tablespoons 4 times daily. 1892 mL 4  . Multiple Vitamin (MULTIVITAMIN WITH MINERALS) TABS tablet Take 1 tablet by mouth daily.    . pantoprazole (PROTONIX) 40 MG tablet TAKE 1 TABLET (40 MG TOTAL) BY MOUTH DAILY. 30 tablet 5  . spironolactone (ALDACTONE) 100 MG tablet Take 100 mg by mouth 2 (two) times daily.    . tamsulosin (FLOMAX) 0.4 MG CAPS capsule Take 1 capsule by mouth  daily. Take once daily by mouth 30 minutes after supper    . Vitamin D, Ergocalciferol, (DRISDOL) 50000 UNITS CAPS capsule Take 50,000 Units by mouth every 7 (seven) days. Mondays.  11  . XIFAXAN 550 MG TABS tablet TAKE 1 TABLET (550 MG TOTAL) BY MOUTH 2 (TWO) TIMES DAILY. 60 tablet 1  . insulin aspart (NOVOLOG FLEXPEN) 100 UNIT/ML FlexPen CBG 121 - 150: 1 unit    CBG 151 - 200: 2 units    CBG 201 - 250: 3 units    CBG 251 - 300: 5 units    CBG 301 - 350: 7 units    CBG 351 - 400 9 units (Patient not taking: Reported on 07/25/2015) 15 mL 1    Allergies as of 07/25/2015  . (No Known Allergies)    Family History  Problem Relation Age of Onset  . Colon cancer Neg Hx   . Anesthesia problems Neg Hx   . Hypotension Neg Hx   . Malignant hyperthermia Neg Hx   . Pseudochol deficiency Neg Hx   . Diabetes Father   . Heart disease Father   . Aneurysm Mother   . Heart disease Other     neice  . Diabetes      niece    Social History   Social History  . Marital Status: Married    Spouse Name: N/A  . Number of Children: 1  . Years of Education: 13   Occupational History  . retired    Social History Main Topics  . Smoking status: Never Smoker   . Smokeless tobacco: Never Used  . Alcohol Use: No     Comment: very rare  . Drug Use: No  . Sexual Activity: No   Other Topics Concern  . Not on file   Social History Narrative   Patient drinks caffeine 1-2 times per week.   Patient is right handed.     Review of Systems: Unable to perform as patient is quite confused.  Physical Exam: Vital signs in last 24 hours: Temp:  [97.9 F (36.6 C)] 97.9 F (36.6 C) (01/18 1152) Pulse Rate:  [72] 72 (01/18 1152) Resp:  [18] 18 (01/18 1152) BP: (97)/(70) 97/70 mmHg (01/18 1152) SpO2:  [100 %] 100 % (01/18 1152)   General:   Disoriented white male in no apparent distress Head:  Normocephalic and atraumatic. Eyes:  Sclera anicteric. Conjunctiva pink. Ears:  Normal auditory  acuity. Nose:  No deformity, discharge,  or lesions. Mouth:  No deformity or lesions.   Neck:  Supple; no masses or thyromegaly. Lungs:  Clear throughout to auscultation.   No wheezes, crackles, or rhonchi.  Heart:  Regular rate and rhythm; no murmurs, clicks, rubs,  or gallops. Abdomen:  Softt, distended, mild diffuse tenderness to palpation BS active,nonpalp mass or hsm.   Rectal:  Deferred  Msk:  Symmetrical without gross deformities. . Pulses:  Normal pulses noted. Extremities:  Without edema. Neurologic: Disoriented Skin: Multiple small papular lesions, mild jaundice Psych: Disoriented   Lab Results:  Recent Labs  07/25/15 1144  WBC 3.3*  HGB 10.3*  HCT 29.4*  PLT 24*   BMET  Recent Labs  07/25/15 1144  NA 133*  K 4.7  CL 101  CO2 21*  GLUCOSE 189*  BUN 38*  CREATININE 2.44*  CALCIUM 10.0   LFT  Recent Labs  07/25/15 1144  PROT 6.5  ALBUMIN 3.7  AST 24  ALT 20  ALKPHOS 57  BILITOT 1.6*   PT/INR  Recent Labs  07/25/15 1144  LABPROT 20.2*  INR 1.73*   No SBP per cell count 1/17 ascites  Studies/Results: US Paracentesis  07/24/2015  INDICATION: Cirrhosis with recurrent large volume ascites. Request diagnostic and therapeutic paracentesis EXAM: ULTRASOUND-GUIDED PARACENTESIS COMPARISON:  None. MEDICATIONS: 1% Lidocaine COMPLICATIONS: None immediate TECHNIQUE: Informed written consent was obtained from the patient after a discussion of the risks, benefits and alternatives to treatment. A timeout was performed prior to the initiation of the procedure. Initial ultrasound scanning demonstrates a large amount of ascites within the right lower abdominal quadrant. An ultrasound image was saved for documentation purposes. The right lower abdomen was prepped and draped in the usual sterile fashion. 1% lidocaine with epinephrine was used for local anesthesia. Under direct ultrasound guidance, a 19 gauge, 7-cm, Yueh catheter was introduced. The paracentesis was  performed. The catheter was removed and a dressing was applied. The patient tolerated the procedure well without immediate post procedural complication. FINDINGS: A total of approximately 8 liters of clear yellow fluid was removed. Samples were sent to the laboratory as requested by the clinical team. IMPRESSION: Successful ultrasound-guided paracentesis yielding 8 liters of peritoneal fluid. Read by:  Gareth Eagle, PA-C Electronically Signed   By: Marybelle Killings M.D.   On: 07/24/2015 10:07    IMPRESSION/PLAN: 65 year old male with [name] with cirrhosis, recurrent ascites, severe pancytopenia, portal hypertension admitted with weakness and change in mental status. Ammonia is elevated, hepatic encephalopathy likely etiology of his confusion. Total bili 1.6, alkaline phosphatase is 57, transaminases normal. BUN and creatinine 38 and 2.44 respectively. He may have a component of dehydration/volume depletion related to his large volume paracentesis, but may be developing hepatorenal syndrome. Recommend IV hydration and close monitoring of I/O. Hold diuretics. Trend renal function. If renal function continues to decline may need initiation of octreotide, midodrine, and albumin as well as nephrology consult. He has severe thrombocytopenia and would follow this closely and transfuse for platelets less than 10,000 or bleeding. For encephalopathy would start lactulose enemas 3 times a day and add Xifaxan when taking by mouth. Will review with attending as to further recommendations. MELD approx 23.   Hvozdovic, Deloris Ping 07/25/2015,  Pager [phone]  Mon-Fri 8a-5p [phone] after 5p, weekends, holidays    [facility] Attending   I have taken an interval history, reviewed the chart and examined the patient. I agree with the Advanced Practitioner's note, impression and recommendations.  AKI and encephalopathy after large volume paracentesis - repeat episode. Treat w/ hydration and lactulose with plans to add  xifaxan soon.  If hydration not helping add albumin at leaast. He got albumin with the paracentesis.  Gatha Mayer, MD, [facility] [phone] (pager) [phone] after 5 PM, weekends and holidays  07/25/2015 6:21 PM

## 2015-07-25 NOTE — ED Notes (Signed)
Code sepsis called.

## 2015-07-26 DIAGNOSIS — E43 Unspecified severe protein-calorie malnutrition: Secondary | ICD-10-CM | POA: Insufficient documentation

## 2015-07-26 DIAGNOSIS — K7200 Acute and subacute hepatic failure without coma: Principal | ICD-10-CM

## 2015-07-26 LAB — COMPREHENSIVE METABOLIC PANEL
ALBUMIN: 3.9 g/dL (ref 3.5–5.0)
ALK PHOS: 56 U/L (ref 38–126)
ALT: 19 U/L (ref 17–63)
ANION GAP: 10 (ref 5–15)
AST: 24 U/L (ref 15–41)
BILIRUBIN TOTAL: 2.2 mg/dL — AB (ref 0.3–1.2)
BUN: 41 mg/dL — AB (ref 6–20)
CALCIUM: 10 mg/dL (ref 8.9–10.3)
CO2: 23 mmol/L (ref 22–32)
CREATININE: 2.41 mg/dL — AB (ref 0.61–1.24)
Chloride: 103 mmol/L (ref 101–111)
GFR calc Af Amer: 31 mL/min — ABNORMAL LOW (ref >60)
GFR calc non Af Amer: 27 mL/min — ABNORMAL LOW (ref >60)
GLUCOSE: 128 mg/dL — AB (ref 65–99)
Potassium: 4.7 mmol/L (ref 3.5–5.1)
SODIUM: 136 mmol/L (ref 135–145)
TOTAL PROTEIN: 6.6 g/dL (ref 6.5–8.1)

## 2015-07-26 LAB — CBC
HEMATOCRIT: 29.3 % — AB (ref 39.0–52.0)
HEMOGLOBIN: 10.2 g/dL — AB (ref 13.0–17.0)
MCH: 31.5 pg (ref 26.0–34.0)
MCHC: 34.8 g/dL (ref 30.0–36.0)
MCV: 90.4 fL (ref 78.0–100.0)
Platelets: 25 10*3/uL — CL (ref 150–400)
RBC: 3.24 MIL/uL — AB (ref 4.22–5.81)
RDW: 15.9 % — ABNORMAL HIGH (ref 11.5–15.5)
WBC: 3.6 10*3/uL — ABNORMAL LOW (ref 4.0–10.5)

## 2015-07-26 LAB — GLUCOSE, CAPILLARY
GLUCOSE-CAPILLARY: 105 mg/dL — AB (ref 65–99)
GLUCOSE-CAPILLARY: 178 mg/dL — AB (ref 65–99)
Glucose-Capillary: 169 mg/dL — ABNORMAL HIGH (ref 65–99)
Glucose-Capillary: 121 mg/dL — ABNORMAL HIGH (ref 65–99)

## 2015-07-26 LAB — AMMONIA: AMMONIA: 90 umol/L — AB (ref 9–35)

## 2015-07-26 MED ORDER — PRO-STAT SUGAR FREE PO LIQD
30.0000 mL | Freq: Three times a day (TID) | ORAL | Status: DC
  Administered 2015-07-26 – 2015-07-31 (×15): 30 mL via ORAL
  Filled 2015-07-26 (×15): qty 30

## 2015-07-26 MED ORDER — SODIUM CHLORIDE 0.9 % IV SOLN
INTRAVENOUS | Status: DC
  Administered 2015-07-26: 100 mL via INTRAVENOUS
  Administered 2015-07-27: 06:00:00 via INTRAVENOUS
  Administered 2015-07-27: 100 mL/h via INTRAVENOUS

## 2015-07-26 MED ORDER — FLUOXETINE HCL 10 MG PO CAPS
10.0000 mg | ORAL_CAPSULE | Freq: Every day | ORAL | Status: DC
  Administered 2015-07-26 – 2015-07-31 (×6): 10 mg via ORAL
  Filled 2015-07-26 (×6): qty 1

## 2015-07-26 MED ORDER — LACTULOSE ENEMA
300.0000 mL | Freq: Three times a day (TID) | ORAL | Status: DC
  Administered 2015-07-26 – 2015-07-28 (×7): 300 mL via RECTAL
  Filled 2015-07-26 (×11): qty 300

## 2015-07-26 NOTE — Progress Notes (Signed)
     [facility] Progress Note  Subjective:  BUN/creat 41/2.41.Lethargic, confused.   Objective:  Vital signs in last 24 hours: Temp:  [97.8 F (36.6 C)-98.6 F (37 C)] 98.6 F (37 C) (01/19 0456) Pulse Rate:  [67-77] 75 (01/19 0456) Resp:  [16-22] 18 (01/19 0456) BP: (95-152)/(51-70) 95/51 mmHg (01/19 0456) SpO2:  [97 %-100 %] 97 % (01/19 0456) Weight:  [167 lb 12.3 oz (76.1 kg)-168 lb 14 oz (76.6 kg)] 168 lb 14 oz (76.6 kg) (01/19 0456) Last BM Date: 07/25/15 General:   Lethargic, able to be roused with loud voice and tactile stimulus Heart:  Regular rate and rhythm; no murmurs Pulm;lungs clear Abdomen:  Soft, distended, NT Normal bowel sounds, without guarding, and without rebound.   Extremities:  Without edema. Neurologic:Lethargic, confused, knows name, but not time, place, etc   Intake/Output from previous day: 01/18 0701 - 01/19 0700 In: 519.2 [P.O.:400; I.V.:119.2] Out: 850 [Urine:850] Intake/Output this shift:    Lab Results:  Recent Labs  07/25/15 1144 07/25/15 2132 07/26/15 0440  WBC 3.3* 3.1* 3.6*  HGB 10.3* 10.3* 10.2*  HCT 29.4* 29.8* 29.3*  PLT 24* 25* 25*   BMET  Recent Labs  07/25/15 1144 07/25/15 2132 07/26/15 0440  NA 133* 136 136  K 4.7 5.1 4.7  CL 101 102 103  CO2 21* 23 23  GLUCOSE 189* 159* 128*  BUN 38* 39* 41*  CREATININE 2.44* 2.34* 2.41*  CALCIUM 10.0 10.0 10.0   LFT  Recent Labs  07/26/15 0440  PROT 6.6  ALBUMIN 3.9  AST 24  ALT 19  ALKPHOS 76  BILITOT 2.2*     ASSESSMENT/PLAN:  65 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 Likely secondary to HE. Pts wife upset that he has not recieved lactulose. Will change po lactulose to lactulose enemas, recheck ammonia. Kidney function climbing--likely due to a component of dehydration. Will increase IVF. Trend renal function. Na+ nl now, but will check urine sodium. Nephrology eval -? Early HRS. (may  need initiation of octreotide, midodrine and albumin). Continue supportive care per hospitalist service.    LOS: 1 day   Hvozdovic, Vita Barley PA-C 07/26/2015, Pager [phone] Mon-Fri 8a-5p [phone] after 5p, weekends, holidays    [facility] Attending   I have taken an interval history, reviewed the chart and examined the patient. I agree with the Advanced Practitioner's note, impression and recommendations.     A bit more awake this PM + asterixis confused Lactulose enemas started Getting some po meds in now that more alert  Will f/u tomorrow  Gatha Mayer, MD, [facility] [phone] (pager) [phone] after 5 PM, weekends and holidays  07/26/2015 2:33 PM

## 2015-07-26 NOTE — Clinical Social Work Placement (Signed)
   CLINICAL SOCIAL WORK PLACEMENT  NOTE  Date:  07/26/2015  Patient Details  Name: [name] MRN: [id number] Date of Birth: [birth year]  Clinical Social Work is seeking post-discharge placement for this patient at the [place] level of care (*CSW will initial, date and re-position this form in  chart as items are completed):  Yes   Patient/family provided with [facility] Work Department's list of facilities offering this level of care within the geographic area requested by the patient (or if unable, by the patient's family).  Yes   Patient/family informed of their freedom to choose among providers that offer the needed level of care, that participate in Medicare, Medicaid or managed care program needed by the patient, have an available bed and are willing to accept the patient.  Yes   Patient/family informed of [facility]'s ownership interest in [facility] and [facility], as well as of the fact that they are under no obligation to receive care at these facilities.  PASRR submitted to EDS on 07/26/15     PASRR number received on 07/26/15     Existing PASRR number confirmed on       FL2 transmitted to all facilities in geographic area requested by pt/family on 07/26/15     FL2 transmitted to all facilities within larger geographic area on       Patient informed that his/her managed care company has contracts with or will negotiate with certain facilities, including the following:            Patient/family informed of bed offers received.  Patient chooses bed at       Physician recommends and patient chooses bed at      Patient to be transferred to   on  .  Patient to be transferred to facility by       Patient family notified on   of transfer.  Name of family member notified:        PHYSICIAN       Additional Comment:    _______________________________________________ Standley Brooking, LCSW 07/26/2015, 3:30 PM

## 2015-07-26 NOTE — Evaluation (Signed)
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 Pt currently with functional limitations due to the deficits listed below (see PT Problem List).  Pt will benefit from skilled PT to increase their independence and safety with mobility to allow discharge to the venue listed below.  Evaluation limited by lethargy.  Pt with recent admission and spouse also reports "this is the worst I've seen him."  Pt will likely require SNF however will keep recommendations updated per pt progress.    Follow Up Recommendations Supervision/Assistance - 24 hour;SNF    Equipment Recommendations  None recommended by PT    Recommendations for Other Services       Precautions / Restrictions Precautions Precautions: Fall      Mobility  Bed Mobility Overal bed mobility: Needs Assistance Bed Mobility: Supine to Sit;Sit to Supine     Supine to sit: Total assist Sit to supine: Total assist   General bed mobility comments: attempted and pt initiates however unable to complete task, was not able to perform full transfer due to weakness and poor cognitive status  Transfers                    Ambulation/Gait                Stairs            Wheelchair Mobility    Modified Rankin (Stroke Patients Only)       Balance                                             Pertinent Vitals/Pain Pain Assessment: No/denies pain    Home Living Family/patient expects to be discharged to:: Private residence Living Arrangements:  Spouse/significant other   Type of Home: House Home Access: Stairs to enter Entrance Stairs-Rails: Right Entrance Stairs-Number of Steps: 2 Home Layout: Multi-level;Able to live on main level with bedroom/bathroom Home Equipment: Kasandra Knudsen - single point;Walker - 2 wheels;Bedside commode;Shower seat      Prior Function Level of Independence: Independent with assistive device(s)         Comments: using cane in community. furniture walking inside home. spouse reports decline in functioning over the past few weeks prior to admission     Hand Dominance        Extremity/Trunk Assessment   Upper Extremity Assessment: Generalized weakness;Difficult to assess due to impaired cognition           Lower Extremity Assessment: Generalized weakness;Difficult to assess due to impaired cognition         Communication   Communication: No difficulties  Cognition Arousal/Alertness: Lethargic   Overall Cognitive Status: Impaired/Different from baseline                 General Comments: lethargic, will answer questions yes/no and open eyes briefly, spouse reports he is not at baseline    General Comments      Exercises        Assessment/Plan    PT Assessment Patient  needs continued PT services  PT Diagnosis Altered mental status;Difficulty walking;Generalized weakness   PT Problem List Decreased activity tolerance;Decreased mobility;Decreased strength;Decreased cognition  PT Treatment Interventions Gait training;Functional mobility training;Therapeutic activities;Patient/family education;Balance training;Therapeutic exercise;DME instruction;Cognitive remediation   PT Goals (Current goals can be found in the Care Plan section) Acute Rehab PT Goals PT Goal Formulation: With patient/family Time For Goal Achievement: 08/09/15 Potential to Achieve Goals: Fair    Frequency Min 3X/week   Barriers to discharge        Co-evaluation               End of Session    Activity Tolerance: Patient limited by lethargy Patient left: in bed;with call bell/phone within reach;with bed alarm set;with family/visitor present           Time: 1159-1209 PT Time Calculation (min) (ACUTE ONLY): 10 min   Charges:   PT Evaluation $PT Eval Low Complexity: 1 Procedure     PT G Codes:        Karrah Mangini,KATHrine E 07/26/2015, 1:29 PM Carmelia Bake, PT, DPT 07/26/2015 Pager: [phone]

## 2015-07-26 NOTE — Clinical Social Work Note (Signed)
Clinical Social Work Assessment  Patient Details  Name: [name] MRN: [id number] Date of Birth: Apr 11, 1951  Date of referral:  07/26/15               Reason for consult:  Facility Placement                Permission sought to share information with:  Chartered certified accountant granted to share information::  Yes, Verbal Permission Granted  Name::        Agency::     Relationship::     Contact Information:     Housing/Transportation Living arrangements for the past 2 months:  Single Family Home Source of Information:  Spouse Patient Interpreter Needed:  None Criminal Activity/Legal Involvement Pertinent to Current Situation/Hospitalization:  No - Comment as needed Significant Relationships:  Adult Children, Spouse Lives with:  Spouse Do you feel safe going back to the place where you live?  No Need for family participation in patient care:  Yes (Comment)  Care giving concerns:  CSW received consult for SNF placement - reviewed PT evaluation recommending SNF.    Social Worker assessment / plan:  CSW spoke with patient's wife at bedside as patient rested.   Employment status:  [employer] Services information:  Other (Comment Required) Psychologist, counselling) PT Recommendations:  [place] / Referral to community resources:  [place]  Patient/Family's Response to care:  Wife would like to discuss with her husband & their son re: discharge plan - SNF vs. Home. CSW will check back tomorrow re: decision. Wife agreed to have information sent out to [facility], will follow-up with bed offers. Patient's mother & father-in-law had both been to [facility] and had bad experiences.   Patient/Family's Understanding of and Emotional Response to Diagnosis, Current Treatment, and Prognosis:    Emotional Assessment Appearance:  Appears stated age Attitude/Demeanor/Rapport:    Affect (typically observed):  Quiet,  Pleasant, Calm Orientation:  Oriented to Self, Oriented to Place, Oriented to  Time, Oriented to Situation Alcohol / Substance use:    Psych involvement (Current and /or in the community):     Discharge Needs  Concerns to be addressed:    Readmission within the last 30 days:    Current discharge risk:    Barriers to Discharge:      Standley Brooking, LCSW 07/26/2015, 3:28 PM

## 2015-07-26 NOTE — Consult Note (Signed)
Referring Provider: No ref. provider found Primary Care Physician:  [name], MD Primary Nephrologist:  .   Reason for Consultation:   Acute kidney Injury   HPI:64 y.o. 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 He is on chronic amoxicillin for this. Pt presented to [facility] ED with worsening mental status changes over past day or so associated with lethargy and confusion, weakness, nausea and vomiting and poor po intake. No fevers. His abdomen was more distended but no reports of abdominal pain. No diarrhea or constipation. No reports of blood in stool or urine. No loss of consciousness.  Per GI note, pt was most recently evaluated by Emilio Math on 07/04/2015 at [facility]. It was felt that transplantation cannot be considered while he is requiring suppressive antibiotic therapy for a chronic infectious process as infection as an exclusionary contraindication for transplant. He was advised to follow-up at [facility] in 3 months  In ED, BP was 122/59, HR 79, RR 18, Oxygen saturaiton 100%. Blood work was notable for WBC count 3.3, hemoglobin 10.3, paltelets 24, creatinine 2.44, INR 1.73 and lactic acid 3.18, ammonia was 144. He had paracentesis 1/17 with 8 L fluid removed at that time.   His last reported normal creatinine was 1.47   05/11/15   Since then his creatinine has been elevated. His urinalysis has been bland with no casts or cellular debris and has gradually risen to 2.4 range   He is making urine and has had no exposure to radiocontrast dye or NSAIDS  He has relatively low blood pressure measurements of 90 - [id number] systolic   Medications include flomax and aldactone         History reviewed. No pertinent past medical history.  Past Surgical History   Procedure Laterality Date  . Kidney stone surgery  yrs ago  . Esophagogastroduodenoscopy  08/01/2011    Procedure: ESOPHAGOGASTRODUODENOSCOPY (EGD);  Surgeon: Scarlette Shorts, MD;  Location: [facility];  Service: Endoscopy;  Laterality: N/A;  . Esophagogastroduodenoscopy N/A 10/27/2012    Procedure: ESOPHAGOGASTRODUODENOSCOPY (EGD);  Surgeon: Inda Castle, MD;  Location: [facility];  Service: Endoscopy;  Laterality: N/A;  . Esophagogastroduodenoscopy N/A 11/22/2012    Procedure: ESOPHAGOGASTRODUODENOSCOPY (EGD);  Surgeon: Irene Shipper, MD;  Location: [facility];  Service: Endoscopy;  Laterality: N/A;  . Colonoscopy    . Upper gastrointestinal endoscopy    . Esophagogastroduodenoscopy N/A 11/29/2013    Procedure: ESOPHAGOGASTRODUODENOSCOPY (EGD);  Surgeon: Irene Shipper, MD;  Location: [facility];  Service: Endoscopy;  Laterality: N/A;  . Esophageal banding Bilateral 11/29/2013    Procedure: ESOPHAGEAL BANDING;  Surgeon: Irene Shipper, MD;  Location: [facility] ENDOSCOPY;  Service: Endoscopy;  Laterality: Bilateral;  . Esophagogastroduodenoscopy N/A 06/13/2014    Procedure: ESOPHAGOGASTRODUODENOSCOPY (EGD);  Surgeon: Irene Shipper, MD;  Location: [facility];  Service: Endoscopy;  Laterality: N/A;  . Esophageal banding N/A 06/13/2014    Procedure: ESOPHAGEAL BANDING;  Surgeon: Irene Shipper, MD;  Location: [facility] ENDOSCOPY;  Service: Endoscopy;  Laterality: N/A;    Prior to Admission medications   Medication Sig Start Date End Date Taking? Authorizing Provider  amoxicillin (AMOXIL) 500 MG capsule Take 1 capsule (500 mg total) by mouth 3 (three) times daily. 01/16/15  Yes Truman Hayward, MD  baclofen (LIORESAL) 20 MG tablet Take  1 tablet (20 mg total) by mouth at bedtime. 06/11/15  Yes Meredith Staggers, MD  bethanechol (URECHOLINE) 50 MG tablet Take 1 tablet by mouth 2 (two) times daily.  12/19/14  Yes Historical Provider, MD  fluconazole (DIFLUCAN) 100 MG tablet Take 1 tablet by mouth daily. 07/19/15  Yes  Historical Provider, MD  FLUoxetine (PROZAC) 10 MG tablet Take 10 mg by mouth daily.   Yes Historical Provider, MD  furosemide (LASIX) 20 MG tablet Take 1 tablet (20 mg total) by mouth 2 (two) times daily. 07/13/15  Yes Robbie Lis, MD  gabapentin (NEURONTIN) 100 MG capsule Take 300 mg by mouth 3 (three) times daily.    Yes Historical Provider, MD  Insulin Degludec (TRESIBA FLEXTOUCH) 200 UNIT/ML SOPN Inject 45 Units into the skin daily. 07/13/15  Yes Robbie Lis, MD  lactulose (CHRONULAC) 10 GM/15ML solution Take 6 tablespoons 4 times daily. 06/18/15  Yes Irene Shipper, MD  Multiple Vitamin (MULTIVITAMIN WITH MINERALS) TABS tablet Take 1 tablet by mouth daily.   Yes Historical Provider, MD  pantoprazole (PROTONIX) 40 MG tablet TAKE 1 TABLET (40 MG TOTAL) BY MOUTH DAILY. 11/13/14  Yes Irene Shipper, MD  spironolactone (ALDACTONE) 100 MG tablet Take 100 mg by mouth 2 (two) times daily.   Yes Historical Provider, MD  tamsulosin (FLOMAX) 0.4 MG CAPS capsule Take 1 capsule by mouth daily. Take once daily by mouth 30 minutes after supper 12/19/14  Yes Historical Provider, MD  Vitamin D, Ergocalciferol, (DRISDOL) 50000 UNITS CAPS capsule Take 50,000 Units by mouth every 7 (seven) days. Mondays. 03/29/15  Yes Historical Provider, MD  XIFAXAN 550 MG TABS tablet TAKE 1 TABLET (550 MG TOTAL) BY MOUTH 2 (TWO) TIMES DAILY. 06/27/15  Yes Irene Shipper, MD  insulin aspart (NOVOLOG FLEXPEN) 100 UNIT/ML FlexPen CBG 121 - 150: 1 unit    CBG 151 - 200: 2 units    CBG 201 - 250: 3 units    CBG 251 - 300: 5 units    CBG 301 - 350: 7 units    CBG 351 - 400 9 units Patient not taking: Reported on 07/25/2015 07/13/15   Robbie Lis, MD    Current Facility-Administered Medications  Medication Dose Route Frequency Provider Last Rate Last Dose  . 0.9 %  sodium chloride infusion   Intravenous Continuous Lori P Hvozdovic, PA-C 100 mL/hr at 07/26/15 1717 100 mL at 07/26/15 1717  . acetaminophen (TYLENOL) tablet 650 mg   650 mg Oral Q6H PRN Robbie Lis, MD       Or  . acetaminophen (TYLENOL) suppository 650 mg  650 mg Rectal Q6H PRN Robbie Lis, MD      . amoxicillin (AMOXIL) capsule 500 mg  500 mg Oral TID Robbie Lis, MD   500 mg at 07/26/15 1942  . baclofen (LIORESAL) tablet 20 mg  20 mg Oral QHS Robbie Lis, MD   20 mg at 07/25/15 2340  . bethanechol (URECHOLINE) tablet 50 mg  50 mg Oral BID Robbie Lis, MD   50 mg at 07/26/15 1305  . feeding supplement (PRO-STAT SUGAR FREE 64) liquid 30 mL  30 mL Oral TID Maricela Bo Ostheim, RD   30 mL at 07/26/15 1743  . fluconazole (DIFLUCAN) tablet 100 mg  100 mg Oral Daily Robbie Lis, MD   100 mg at 07/26/15 1308  . FLUoxetine (PROZAC) capsule 10 mg  10 mg Oral Daily Robbie Lis, MD  10 mg at 07/26/15 1308  . gabapentin (NEURONTIN) capsule 300 mg  300 mg Oral TID Robbie Lis, MD   300 mg at 07/26/15 1742  . insulin aspart (novoLOG) injection 0-15 Units  0-15 Units Subcutaneous TID WC Robbie Lis, MD   2 Units at 07/26/15 1721  . insulin aspart (novoLOG) injection 0-5 Units  0-5 Units Subcutaneous QHS Robbie Lis, MD   0 Units at 07/25/15 2200  . Insulin Degludec SOPN 20 Units  20 Units Subcutaneous Daily Robbie Lis, MD   20 Units at 07/26/15 1000  . lactulose (CHRONULAC) enema 200 gm  300 mL Rectal TID Lori P Hvozdovic, PA-C   300 mL at 07/26/15 1616  . multivitamin with minerals tablet 1 tablet  1 tablet Oral Daily Robbie Lis, MD   1 tablet at 07/26/15 1309  . ondansetron (ZOFRAN) tablet 4 mg  4 mg Oral Q6H PRN Robbie Lis, MD       Or  . ondansetron [facility]) injection 4 mg  4 mg Intravenous Q6H PRN Robbie Lis, MD   4 mg at 07/25/15 1701  . pantoprazole (PROTONIX) EC tablet 40 mg  40 mg Oral Daily Robbie Lis, MD   40 mg at 07/26/15 1310  . rifaximin (XIFAXAN) tablet 550 mg  550 mg Oral BID Robbie Lis, MD   550 mg at 07/26/15 1311  . tamsulosin (FLOMAX) capsule 0.4 mg  0.4 mg Oral QPC supper Robbie Lis, MD   0.4 mg at 07/26/15  1943  . [START ON 07/31/2015] Vitamin D (Ergocalciferol) (DRISDOL) capsule 50,000 Units  50,000 Units Oral Q7 days Robbie Lis, MD        Allergies as of 07/25/2015  . (No Known Allergies)    Family History  Problem Relation Age of Onset  . Colon cancer Neg Hx   . Anesthesia problems Neg Hx   . Hypotension Neg Hx   . Malignant hyperthermia Neg Hx   . Pseudochol deficiency Neg Hx   . Diabetes Father   . Heart disease Father   . Aneurysm Mother   . Heart disease Other     neice  . Diabetes      niece    Social History   Social History  . Marital Status: Married    Spouse Name: N/A  . Number of Children: 1  . Years of Education: 13   Occupational History  . retired    Social History Main Topics  . Smoking status: Never Smoker   . Smokeless tobacco: Never Used  . Alcohol Use: No     Comment: very rare  . Drug Use: No  . Sexual Activity: No   Other Topics Concern  . Not on file   Social History Narrative   Patient drinks caffeine 1-2 times per week.   Patient is right handed.     Review of Systems: Unobtainable   Physical Exam: Vital signs in last 24 hours: Temp:  [98.3 F (36.8 C)-98.6 F (37 C)] 98.3 F (36.8 C) (01/19 1442) Pulse Rate:  [72-75] 72 (01/19 1442) Resp:  [18-20] 20 (01/19 1442) BP: (95-106)/(51-63) 106/63 mmHg (01/19 1442) SpO2:  [97 %] 97 % (01/19 1442) Weight:  [76.6 kg (168 lb 14 oz)] 76.6 kg (168 lb 14 oz) (01/19 0456) Last BM Date: 07/25/15 General:    Somnolent  Head:  Normocephalic and atraumatic. Eyes:  Sclera clear, no icterus.   Conjunctiva pink. Ears:  Normal  auditory acuity. Nose:  No deformity, discharge,  or lesions. Mouth:  No deformity or lesions, dentition normal. Neck:  Supple; no masses or thyromegaly. JVP not elevated Lungs:  Clear throughout to auscultation.   No wheezes, crackles, or rhonchi. No acute distress. Heart:  Regular rate and rhythm; no murmurs, clicks, rubs,  or gallops. Abdomen:  Soft, nontender  and nondistended. No masses, hepatosplenomegaly or hernias noted. Normal bowel sounds, without guarding, and without rebound.   Msk:  Symmetrical without gross deformities. Normal posture. Pulses:  No carotid, renal, femoral bruits. DP and PT symmetrical and equal Extremities:  Without clubbing or edema. Neurologic:  Alert and  oriented x4;  grossly normal neurologically. Skin:  Intact without significant lesions or rashes. Cervical Nodes:  No significant cervical adenopathy. Psych:  Alert and cooperative. Normal mood and affect.  Intake/Output from previous day: 01/18 0701 - 01/19 0700 In: 519.2 [P.O.:400; I.V.:119.2] Out: 850 [Urine:850] Intake/Output this shift:    Lab Results:  Recent Labs  07/25/15 1144 07/25/15 2132 07/26/15 0440  WBC 3.3* 3.1* 3.6*  HGB 10.3* 10.3* 10.2*  HCT 29.4* 29.8* 29.3*  PLT 24* 25* 25*   BMET  Recent Labs  07/25/15 1144 07/25/15 2132 07/26/15 0440  NA 133* 136 136  K 4.7 5.1 4.7  CL 101 102 103  CO2 21* 23 23  GLUCOSE 189* 159* 128*  BUN 38* 39* 41*  CREATININE 2.44* 2.34* 2.41*  CALCIUM 10.0 10.0 10.0  PHOS  --  3.6  --    LFT  Recent Labs  07/26/15 0440  PROT 6.6  ALBUMIN 3.9  AST 24  ALT 19  ALKPHOS 56  BILITOT 2.2*   PT/INR  Recent Labs  07/25/15 1144 07/25/15 2132  LABPROT 20.2* 19.1*  INR 1.73* 1.60*   Hepatitis Panel No results for input(s): HEPBSAG, HCVAB, HEPAIGM, HEPBIGM in the last 72 hours.  Studies/Results: No results found.  Assessment/Plan:  Acute kidney injury . This appears to have occurred around November 2016 and has been fluctuating. It appears that clinically this is related to hypotensive spells and paracentesis and would conclude that this is due to decreased renal perfusion.  Hepatorenal would be in the differential and would consider this a possibility should his renal function continue to worsen despite normalization of hemodynamics  Acute interstitial nephritis would also be a  possibility although there is no white cells in the urine sediment  PLAN        For now I would continue supportive care  Urine Na and a renal U/S may be useful adjuncts        Overall prognosis would depend on his liver function and ability to handle ascites    LOS: 1 Ziara Thelander W @TODAY @9 :31 PM

## 2015-07-26 NOTE — H&P (Signed)
[facility] PROGRESS NOTE  [name] [id number] DOB: April 09, 1951 DOA: 07/25/2015 PCP: [name], MD  Assessment/Plan: Principal Problem:   Acute hepatic encephalopathy ([place]) - GI on board and assisting - on lactulose and rifaximin  Active Problems:   ESOPHAGEAL STRICTURE - GI on board  Depression - stable on prozac  Dehydration - agree with increasing fluids    Controlled type 2 diabetes mellitus with diabetic nephropathy, without long-term current use of insulin (HCC) - Stable on SSI   Diabetic neuropathy ([place]) - given drowsiness agree with holding gabapentin   Coagulopathy (HCC)   NASH (nonalcoholic steatohepatitis)   Protein-calorie malnutrition, severe - continue MV  Code Status:  full Family Communication: discussed with patient and spouse  Disposition Plan: pending improvement in condition   Consultants:  GI  Procedures:  Radiology  Antibiotics:  Amoxicillin  Rifaximin  HPI/Subjective: Pt is somnolent but answers my questions appropriately  Objective: Filed Vitals:   07/26/15 0456 07/26/15 1442  BP: 95/51 106/63  Pulse: 75 72  Temp: 98.6 F (37 C) 98.3 F (36.8 C)  Resp: 18 20    Intake/Output Summary (Last 24 hours) at 07/26/15 1632 Last data filed at 07/26/15 1443  Gross per 24 hour  Intake 879.17 ml  Output   1250 ml  Net -370.83 ml   Filed Weights   07/25/15 2046 07/26/15 0456  Weight: 76.1 kg (167 lb 12.3 oz) 76.6 kg (168 lb 14 oz)    Exam:   General:  Pt in nad, somnolent but arrousable   Cardiovascular: rrr, no rubs  Respiratory: no increased wob  Abdomen: soft, no guarding  Musculoskeletal: no cyanosis    Data Reviewed: Basic Metabolic Panel:  Recent Labs Lab 07/25/15 1144 07/25/15 2132 07/26/15 0440  NA 133* 136 136  K 4.7 5.1 4.7  CL 101 102 103  CO2 21* 23 23  GLUCOSE 189* 159* 128*  BUN 38* 39* 41*  CREATININE 2.44* 2.34* 2.41*  CALCIUM 10.0 10.0 10.0  MG  --  2.2  --   PHOS   --  3.6  --    Liver Function Tests:  Recent Labs Lab 07/25/15 1144 07/25/15 2132 07/26/15 0440  AST 24 25 24   ALT 20 19 19   ALKPHOS 57 60 56  BILITOT 1.6* 2.0* 2.2*  PROT 6.5 6.8 6.6  ALBUMIN 3.7 3.9 3.9    Recent Labs Lab 07/25/15 1144  LIPASE 55*    Recent Labs Lab 07/25/15 1143 07/26/15 1218  AMMONIA 144* 90*   CBC:  Recent Labs Lab 07/25/15 1144 07/25/15 2132 07/26/15 0440  WBC 3.3* 3.1* 3.6*  NEUTROABS 2.6 2.4  --   HGB 10.3* 10.3* 10.2*  HCT 29.4* 29.8* 29.3*  MCV 89.6 90.0 90.4  PLT 24* 25* 25*   Cardiac Enzymes: No results for input(s): CKTOTAL, CKMB, CKMBINDEX, TROPONINI in the last 168 hours. BNP (last 3 results)  Recent Labs  07/25/15 1144  BNP 75.7    ProBNP (last 3 results) No results for input(s): PROBNP in the last 8760 hours.  CBG:  Recent Labs Lab 07/25/15 2211 07/26/15 0739 07/26/15 1139  GLUCAP 145* 105* 169*    No results found for this or any previous visit (from the past 240 hour(s)).   Studies: No results found.  Scheduled Meds: . amoxicillin  500 mg Oral TID  . baclofen  20 mg Oral QHS  . bethanechol  50 mg Oral BID  . feeding supplement (PRO-STAT SUGAR FREE 64)  30 mL Oral  TID  . fluconazole  100 mg Oral Daily  . FLUoxetine  10 mg Oral Daily  . gabapentin  300 mg Oral TID  . insulin aspart  0-15 Units Subcutaneous TID WC  . insulin aspart  0-5 Units Subcutaneous QHS  . Insulin Degludec  20 Units Subcutaneous Daily  . lactulose  300 mL Rectal TID  . multivitamin with minerals  1 tablet Oral Daily  . pantoprazole  40 mg Oral Daily  . rifaximin  550 mg Oral BID  . tamsulosin  0.4 mg Oral QPC supper  . [START ON 07/31/2015] Vitamin D (Ergocalciferol)  50,000 Units Oral Q7 days   Continuous Infusions: . sodium chloride 100 mL/hr at 07/26/15 1316    Time spent: > 35 minutes  Velvet Bathe  [facility] Pager [phone] If 7PM-7AM, please contact night-coverage at [email or web address], password  [facility] 07/26/2015, 4:32 PM  LOS: 1 day

## 2015-07-26 NOTE — NC FL2 (Signed)
[place] MEDICAID FL2 LEVEL OF CARE SCREENING TOOL     IDENTIFICATION  Patient Name: [name] Birthdate: 07/20/1950 Sex: male Admission Date (Current Location): 07/25/2015  [facility] and [place] Number:  [place] and Address:  [facility],  [place] [address], [place]      Provider Number: [id number]  Attending Physician Name and Address:  Velvet Bathe, MD  Relative Name and Phone Number:       Current Level of Care: Hospital Recommended Level of Care: [place] Prior Approval Number:    Date Approved/Denied:   PASRR Number: [id number]  Discharge Plan: SNF    Current Diagnoses: Patient Active Problem List   Diagnosis Date Noted  . Protein-calorie malnutrition, severe 07/26/2015  . Ascites 07/25/2015  . Acute renal failure superimposed on stage 3 chronic kidney disease ([place]) 07/25/2015  . Anemia of chronic disease 07/25/2015  . Depression 07/25/2015  . Controlled type 2 diabetes mellitus with diabetic nephropathy, without long-term current use of insulin ([place]) 07/25/2015  . Diabetic neuropathy ([place]) 07/25/2015  . Coagulopathy ([place]) 07/25/2015  . NASH (nonalcoholic steatohepatitis) 07/25/2015  . Acute hepatic encephalopathy ([place]) 01/30/2015  . Thrombocytopenia ([place]) 10/22/2014  . Bleeding esophageal varices ([place]) 10/27/2012  . ESOPHAGEAL STRICTURE 09/23/2004    Orientation RESPIRATION BLADDER Height & Weight    Self, Time, Situation, Place  Normal Continent, Indwelling catheter 5\' 10"  (177.8 cm) 168 lbs.  BEHAVIORAL SYMPTOMS/MOOD NEUROLOGICAL BOWEL NUTRITION STATUS      Incontinent Diet (Heart Healthy)  AMBULATORY STATUS COMMUNICATION OF NEEDS Skin   Limited Assist Verbally Normal                       Personal Care Assistance Level of Assistance  Bathing, Feeding, Dressing Bathing Assistance: Limited assistance Feeding assistance: Limited assistance Dressing Assistance: Limited assistance      Functional Limitations [place]  PT (By licensed PT), OT (By licensed OT)     PT Frequency: 5 OT Frequency: 5            Contractures      Additional Factors Info  Code Status, Allergies Code Status Info: fullcode Allergies Info: NKDA           Current Medications (07/26/2015):  This is the current hospital active medication list Current Facility-Administered Medications  Medication Dose Route Frequency Provider Last Rate Last Dose  . 0.9 %  sodium chloride infusion   Intravenous Continuous Lori P Hvozdovic, PA-C 100 mL/hr at 07/26/15 1316    . acetaminophen (TYLENOL) tablet 650 mg  650 mg Oral Q6H PRN Robbie Lis, MD       Or  . acetaminophen (TYLENOL) suppository 650 mg  650 mg Rectal Q6H PRN Robbie Lis, MD      . amoxicillin (AMOXIL) capsule 500 mg  500 mg Oral TID Robbie Lis, MD   500 mg at 07/26/15 1306  . baclofen (LIORESAL) tablet 20 mg  20 mg Oral QHS Robbie Lis, MD   20 mg at 07/25/15 2340  . bethanechol (URECHOLINE) tablet 50 mg  50 mg Oral BID Robbie Lis, MD   50 mg at 07/26/15 1305  . feeding supplement (PRO-STAT SUGAR FREE 64) liquid 30 mL  30 mL Oral TID Maricela Bo Ostheim, RD   30 mL at 07/26/15 1100  . fluconazole (DIFLUCAN) tablet 100 mg  100 mg Oral Daily Robbie Lis, MD   100 mg at 07/26/15 1308  . FLUoxetine (PROZAC) capsule 10 mg  10 mg Oral Daily Robbie Lis, MD   10 mg at 07/26/15 1308  . gabapentin (NEURONTIN) capsule 300 mg  300 mg Oral TID Robbie Lis, MD   300 mg at 07/26/15 1308  . insulin aspart (novoLOG) injection 0-15 Units  0-15 Units Subcutaneous TID WC Robbie Lis, MD   3 Units at 07/26/15 1223  . insulin aspart (novoLOG) injection 0-5 Units  0-5 Units Subcutaneous QHS Robbie Lis, MD   0 Units at 07/25/15 2200  . Insulin Degludec SOPN 20 Units  20 Units Subcutaneous Daily Robbie Lis, MD   20 Units at 07/26/15 1000  . lactulose (CHRONULAC) enema 200 gm  300 mL Rectal TID  Lori P Hvozdovic, PA-C      . multivitamin with minerals tablet 1 tablet  1 tablet Oral Daily Robbie Lis, MD   1 tablet at 07/26/15 1309  . ondansetron (ZOFRAN) tablet 4 mg  4 mg Oral Q6H PRN Robbie Lis, MD       Or  . ondansetron [facility]) injection 4 mg  4 mg Intravenous Q6H PRN Robbie Lis, MD   4 mg at 07/25/15 1701  . pantoprazole (PROTONIX) EC tablet 40 mg  40 mg Oral Daily Robbie Lis, MD   40 mg at 07/26/15 1310  . rifaximin (XIFAXAN) tablet 550 mg  550 mg Oral BID Robbie Lis, MD   550 mg at 07/26/15 1311  . tamsulosin (FLOMAX) capsule 0.4 mg  0.4 mg Oral QPC supper Robbie Lis, MD   0.4 mg at 07/25/15 2200  . [START ON 07/31/2015] Vitamin D (Ergocalciferol) (DRISDOL) capsule 50,000 Units  50,000 Units Oral Q7 days Robbie Lis, MD         Discharge Medications: Please see discharge summary for a list of discharge medications.  Relevant Imaging Results:  Relevant Lab Results:   Additional Information SSN: [id number]  Standley Brooking, LCSW

## 2015-07-26 NOTE — Progress Notes (Signed)
Initial Nutrition Assessment  DOCUMENTATION CODES:   Severe malnutrition in context of chronic illness  INTERVENTION:  - Will order 30 mL Prostat TID, each supplement provides 100 kcal and 15 grams of protein. - Encourage PO intakes of meals and supplements - RD will continue to monitor for needs  NUTRITION DIAGNOSIS:   Inadequate oral intake related to poor appetite, chronic illness as evidenced by per patient/family report.  GOAL:   Patient will meet greater than or equal to 90% of their needs  MONITOR:   PO intake, Supplement acceptance, Weight trends, Labs, I & O's  REASON FOR ASSESSMENT:   Consult Diet education  ASSESSMENT:   65 y.o. 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 He is on chronic amoxicillin for this. Pt presented to [facility] ED with worsening mental status changes over past day or so associated with lethargy and confusion, weakness, nausea and vomiting and poor po intake. No fevers. His abdomen was more distended but no reports of abdominal pain. No diarrhea or constipation. No reports of blood in stool or urine. No loss of consciousness.  Pt seen for consult for education; education provided and full assessment also done. BMI indicates normal weight. No intakes documented since admission. Pt sleeping at time of RD visit and notes indicate that he has been confused since admission. Spoke with pt's wife, who is at bedside, who reported all information.  Breakfast tray in the room with ~50% completion of bowl of Cheerios and 2 cups of orange juice consumed. Wife states that pt has had a very poor appetite x1 week due to not feeling well. She states that in April 2016 he had an infection and was at [facility] for 3 weeks. Since that time he  has not had much of an appetite although it did slightly increase over the summer.   Wife states that pt is being followed at [facility] for liver transplant. Pt and wife have been extensively educated on high protein, low sodium diet. Wife reports that she does not use salt when cooking or add it after cooking. Pt's biggest source of sodium intake is from [employer] canned soups. Pt is unable to finish an entire can and will only consume a half can when he eats this item.  Unable to perform physical assessment at this time. Wife reports that pt weighed 235 lbs prior to hospitalization in April 2016. This indicates, using CBW, 95 lb weight loss (40% body weight) in <1 year which is significant for time frame.   Pt with hx of cirrhosis and documentation indicates he has esophageal varices. Wife denies pt having chewing or swallowing difficulty or complaining of pain with swallowing. Per chart review, pt had paracentesis 07/06/15 which yielded 8L and repeat paracentesis 07/24/15 also yielded 8L.  Not meeting needs. Will order supplements to provide additional protein; will monitor for need to adjust this as pt has hx of CKD stage 3 with current ARF exacerbating this. Medications reviewed. Labs reviewed; CBGs: 69-223 mg/dL, BUN/creatinine elevated and trending up, GFR: 27.   Diet Order:  Diet Heart Room service appropriate?: Yes; Fluid consistency:: Thin  Skin:  Reviewed, no issues  Last BM:  1/18  Height:   Ht Readings from Last 1 Encounters:  07/25/15 5\' 10"  (1.778 m)    Weight:  Wt Readings from Last 1 Encounters:  07/26/15 168 lb 14 oz (76.6 kg)    Ideal Body Weight:  75.45 kg (kg)  BMI:  Body mass index is 24.23 kg/(m^2).  Estimated Nutritional Needs:   Kcal:  1900-2100  Protein:  85-95 grams  Fluid:  1.5 L/day  EDUCATION NEEDS:   Education needs addressed     Jarome Matin, RD, LDN Inpatient Clinical Dietitian Pager # [phone] After hours/weekend pager # [phone]

## 2015-07-27 ENCOUNTER — Inpatient Hospital Stay (HOSPITAL_COMMUNITY): Payer: Managed Care, Other (non HMO)

## 2015-07-27 LAB — COMPREHENSIVE METABOLIC PANEL
ALT: 19 U/L (ref 17–63)
ANION GAP: 7 (ref 5–15)
AST: 23 U/L (ref 15–41)
Albumin: 3.6 g/dL (ref 3.5–5.0)
Alkaline Phosphatase: 55 U/L (ref 38–126)
BUN: 37 mg/dL — ABNORMAL HIGH (ref 6–20)
CHLORIDE: 104 mmol/L (ref 101–111)
CO2: 22 mmol/L (ref 22–32)
Calcium: 9.1 mg/dL (ref 8.9–10.3)
Creatinine, Ser: 2.01 mg/dL — ABNORMAL HIGH (ref 0.61–1.24)
GFR, EST AFRICAN AMERICAN: 39 mL/min — AB (ref >60)
GFR, EST NON AFRICAN AMERICAN: 34 mL/min — AB (ref >60)
Glucose, Bld: 143 mg/dL — ABNORMAL HIGH (ref 65–99)
POTASSIUM: 4.7 mmol/L (ref 3.5–5.1)
Sodium: 133 mmol/L — ABNORMAL LOW (ref 135–145)
Total Bilirubin: 2.7 mg/dL — ABNORMAL HIGH (ref 0.3–1.2)
Total Protein: 6.2 g/dL — ABNORMAL LOW (ref 6.5–8.1)

## 2015-07-27 LAB — CBC
HCT: 30.5 % — ABNORMAL LOW (ref 39.0–52.0)
Hemoglobin: 10.4 g/dL — ABNORMAL LOW (ref 13.0–17.0)
MCH: 31.2 pg (ref 26.0–34.0)
MCHC: 34.1 g/dL (ref 30.0–36.0)
MCV: 91.6 fL (ref 78.0–100.0)
PLATELETS: 24 10*3/uL — AB (ref 150–400)
RBC: 3.33 MIL/uL — ABNORMAL LOW (ref 4.22–5.81)
RDW: 15.8 % — AB (ref 11.5–15.5)
WBC: 2.8 10*3/uL — AB (ref 4.0–10.5)

## 2015-07-27 LAB — SODIUM, URINE, RANDOM: Sodium, Ur: 19 mmol/L

## 2015-07-27 LAB — GLUCOSE, CAPILLARY
GLUCOSE-CAPILLARY: 244 mg/dL — AB (ref 65–99)
GLUCOSE-CAPILLARY: 154 mg/dL — AB (ref 65–99)
Glucose-Capillary: 127 mg/dL — ABNORMAL HIGH (ref 65–99)

## 2015-07-27 LAB — AMMONIA: AMMONIA: 38 umol/L — AB (ref 9–35)

## 2015-07-27 NOTE — Progress Notes (Addendum)
[facility] PROGRESS NOTE  [name] [id number] DOB: March 28, 1951 DOA: 07/25/2015 PCP: [name], MD  Assessment/Plan: Principal Problem:   Acute hepatic encephalopathy ([place]) - GI on board and assisting - on lactulose and rifaximin addendum: will continue regimen as recommended by GI  Active Problems:   ESOPHAGEAL STRICTURE - GI on board  Depression - stable on prozac  AKI - improving and most likely due to dehydration  Dehydration - agree with increasing fluids    Controlled type 2 diabetes mellitus with diabetic nephropathy, without long-term current use of insulin (HCC) - Stable on SSI   Diabetic neuropathy ([place]) - given drowsiness agree with holding gabapentin   Coagulopathy (HCC)   NASH (nonalcoholic steatohepatitis)   Protein-calorie malnutrition, severe - continue MV  Code Status:  full Family Communication: discussed with patient and spouse  Disposition Plan: pending improvement in condition   Consultants:  GI  Procedures:  Radiology  Antibiotics:  Amoxicillin  Rifaximin  HPI/Subjective: Pt has no new complaints. More alert today  Objective: Filed Vitals:   07/26/15 2135 07/27/15 0457  BP: 107/70 111/60  Pulse: 71 73  Temp: 98.4 F (36.9 C) 98.3 F (36.8 C)  Resp: 18 18    Intake/Output Summary (Last 24 hours) at 07/27/15 1253 Last data filed at 07/27/15 1000  Gross per 24 hour  Intake 2653.33 ml  Output   1200 ml  Net 1453.33 ml   Filed Weights   07/25/15 2046 07/26/15 0456 07/27/15 0457  Weight: 76.1 kg (167 lb 12.3 oz) 76.6 kg (168 lb 14 oz) 78.2 kg (172 lb 6.4 oz)    Exam:   General:  Pt in nad, somnolent but arrousable   Cardiovascular: rrr, no rubs  Respiratory: no increased wob  Abdomen: soft, no guarding  Musculoskeletal: no cyanosis    Data Reviewed: Basic Metabolic Panel:  Recent Labs Lab 07/25/15 1144 07/25/15 2132 07/26/15 0440 07/27/15 0915  NA 133* 136 136 133*  K 4.7 5.1 4.7  4.7  CL 101 102 103 104  CO2 21* 23 23 22   GLUCOSE 189* 159* 128* 143*  BUN 38* 39* 41* 37*  CREATININE 2.44* 2.34* 2.41* 2.01*  CALCIUM 10.0 10.0 10.0 9.1  MG  --  2.2  --   --   PHOS  --  3.6  --   --    Liver Function Tests:  Recent Labs Lab 07/25/15 1144 07/25/15 2132 07/26/15 0440 07/27/15 0915  AST 24 25 24 23   ALT 20 19 19 19   ALKPHOS 57 60 56 55  BILITOT 1.6* 2.0* 2.2* 2.7*  PROT 6.5 6.8 6.6 6.2*  ALBUMIN 3.7 3.9 3.9 3.6    Recent Labs Lab 07/25/15 1144  LIPASE 55*    Recent Labs Lab 07/25/15 1143 07/26/15 1218 07/27/15 1050  AMMONIA 144* 90* 38*   CBC:  Recent Labs Lab 07/25/15 1144 07/25/15 2132 07/26/15 0440 07/27/15 0915  WBC 3.3* 3.1* 3.6* 2.8*  NEUTROABS 2.6 2.4  --   --   HGB 10.3* 10.3* 10.2* 10.4*  HCT 29.4* 29.8* 29.3* 30.5*  MCV 89.6 90.0 90.4 91.6  PLT 24* 25* 25* 24*   Cardiac Enzymes: No results for input(s): CKTOTAL, CKMB, CKMBINDEX, TROPONINI in the last 168 hours. BNP (last 3 results)  Recent Labs  07/25/15 1144  BNP 75.7    ProBNP (last 3 results) No results for input(s): PROBNP in the last 8760 hours.  CBG:  Recent Labs Lab 07/26/15 0739 07/26/15 1139 07/26/15 1655 07/26/15 2219 07/27/15  0730  GLUCAP 105* 169* 121* 178* 127*    No results found for this or any previous visit (from the past 240 hour(s)).   Studies: US Renal  07/27/2015  CLINICAL DATA:  Acute renal failure. EXAM: RENAL / URINARY TRACT ULTRASOUND COMPLETE COMPARISON:  Multiple prior ultrasounds for paracentesis localization. FINDINGS: Right Kidney: Length: 11.1 cm. Echogenicity within normal limits. No mass or hydronephrosis visualized. Left Kidney: Length: 11.8 cm. Echogenicity within normal limits. No mass or hydronephrosis visualized. Bladder: Decompressed by Foley catheter. Additional findings:  Splenomegaly.  Ascites. IMPRESSION: No hydronephrosis. Normal renal architecture. No evidence for bladder outlet obstruction. Electronically Signed    By: Staci Righter M.D.   On: 07/27/2015 10:04    Scheduled Meds: . amoxicillin  500 mg Oral TID  . baclofen  20 mg Oral QHS  . bethanechol  50 mg Oral BID  . feeding supplement (PRO-STAT SUGAR FREE 64)  30 mL Oral TID  . fluconazole  100 mg Oral Daily  . FLUoxetine  10 mg Oral Daily  . gabapentin  300 mg Oral TID  . insulin aspart  0-15 Units Subcutaneous TID WC  . insulin aspart  0-5 Units Subcutaneous QHS  . Insulin Degludec  20 Units Subcutaneous Daily  . lactulose  300 mL Rectal TID  . multivitamin with minerals  1 tablet Oral Daily  . pantoprazole  40 mg Oral Daily  . rifaximin  550 mg Oral BID  . tamsulosin  0.4 mg Oral QPC supper  . [START ON 07/31/2015] Vitamin D (Ergocalciferol)  50,000 Units Oral Q7 days   Continuous Infusions: . sodium chloride 100 mL/hr at 07/27/15 0615    Time spent: > 35 minutes  Velvet Bathe  [facility] Pager [phone] If 7PM-7AM, please contact night-coverage at [email or web address], password [email or web address] 07/27/2015, 12:53 PM  LOS: 2 days

## 2015-07-27 NOTE — Progress Notes (Signed)
     [facility] Progress Note  Subjective:  Hgb 10.4, platelets 24K. BUN/creat 37/2.01.Alert today, knows name, place, time, recognizes wife.Renal U/S this morning with no hydronephrosis, normal renal architecture, no bladder outlet obstruction.   Objective:  Vital signs in last 24 hours: Temp:  [98.3 F (36.8 C)-98.4 F (36.9 C)] 98.3 F (36.8 C) (01/20 0457) Pulse Rate:  [71-73] 73 (01/20 0457) Resp:  [18-20] 18 (01/20 0457) BP: (106-111)/(60-70) 111/60 mmHg (01/20 0457) SpO2:  [97 %-100 %] 100 % (01/20 0457) Weight:  [172 lb 6.4 oz (78.2 kg)] 172 lb 6.4 oz (78.2 kg) (01/20 0457) Last BM Date: 07/25/15 General:   Alert,  Well-developed,    in NAD Heart:  Regular rate and rhythm; no murmurs Pulm;lungs clear Abdomen:  Soft, nontender , distended w mod ascites, Normal bowel sounds, without guarding, and without rebound.   Extremities:  Without edema. Neurologic: Alert and  Oriented no asterixis Psych: Alert and cooperative.    Lab Results:  Recent Labs  07/25/15 2132 07/26/15 0440 07/27/15 0915  WBC 3.1* 3.6* 2.8*  HGB 10.3* 10.2* 10.4*  HCT 29.8* 29.3* 30.5*  PLT 25* 25* 24*   BMET  Recent Labs  07/25/15 1144 07/25/15 2132 07/26/15 0440  NA 133* 136 136  K 4.7 5.1 4.7  CL 101 102 103  CO2 21* 23 23  GLUCOSE 189* 159* 128*  BUN 38* 39* 41*  CREATININE 2.44* 2.34* 2.41*  CALCIUM 10.0 10.0 10.0   NH3 90 yesterday 38 today  ASSESSMENT/PLAN:     65 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 Likely secondary to HE. Mental status much improved today. Continue lactulose eneam tid--may be able to change to po tomorrow. Will check ammonia. Renal status improving. Continue supportive care.  Appreciate renal input.         LOS: 2 days   Hvozdovic, Vita Barley PA-C 07/27/2015, Pager [phone] Mon-Fri 8a-5p [phone] after 5p, weekends, holidays    [facility] Attending   I have taken an  interval history, reviewed the chart and examined the patient. I agree with the Advanced Practitioner's note, impression and recommendations.   Renal suspects hypoperfusion related to paracenteses and vol shifts as cause of AKI Encephalopathy better not resolved Re: ascites management - will need lower vol paracentesis if done in future Better but not out of the woods yet  Will follow-up tomorrow  Gatha Mayer, MD, [facility] [phone] (pager) [phone] after 5 PM, weekends and holidays  07/27/2015 1:09 PM

## 2015-07-27 NOTE — Care Management Note (Signed)
Case Management Note  Patient Details  Name: [name] MRN: [id number] Date of Birth: 1950/09/08  Subjective/Objective:   Pt admitted with Acute Hepatic encephalopathy                 Action/Plan: Pt plan to dc home with [facility].    Expected Discharge Date:   (unknown)               Expected Discharge Plan:  [place]  In-House Referral:     Discharge planning Services  CM Consult  Post Acute Care Choice:    Choice offered to:  Patient, Spouse  DME Arranged:    DME Agency:     HH Arranged:  RN, NA [place] Agency:  [place]  Status of Service:  In process, will continue to follow  Medicare Important Message Given:    Date Medicare IM Given:    Medicare IM give by:    Date Additional Medicare IM Given:    Additional Medicare Important Message give by:     If discussed at [place] of Stay Meetings, dates discussed:    Additional CommentsPurcell Mouton, RN 07/27/2015, 12:25 PM

## 2015-07-27 NOTE — Progress Notes (Signed)
CSW spoke with patient's wife re: discharge planning. Wife informed CSW that patient has been doing better & that she plans to take him back home at discharge. RNCM, Cookie aware.   No further CSW needs identified - CSW signing off.   Raynaldo Opitz, [facility] Clinical Social Worker cell #: [phone]

## 2015-07-27 NOTE — Progress Notes (Signed)
Assessment/Plan:  Acute kidney injury---improved.  Hemodynamically mediated due to fluid shifts  Subjective: Interval History: Foley cath irritation  Objective: Vital signs in last 24 hours: Temp:  [98.1 F (36.7 C)-98.4 F (36.9 C)] 98.1 F (36.7 C) (01/20 1402) Pulse Rate:  [71-80] 80 (01/20 1402) Resp:  [18-20] 20 (01/20 1402) BP: (105-111)/(60-70) 105/62 mmHg (01/20 1402) SpO2:  [97 %-100 %] 100 % (01/20 1402) Weight:  [78.2 kg (172 lb 6.4 oz)] 78.2 kg (172 lb 6.4 oz) (01/20 0457) Weight change: 2.1 kg (4 lb 10.1 oz)  Intake/Output from previous day: 01/19 0701 - 01/20 0700 In: 2373.3 [P.O.:600; I.V.:1773.3] Out: 1200 [Urine:1200] Intake/Output this shift: Total I/O In: 640 [P.O.:640] Out: 350 [Urine:350]  General appearance: alert and cooperative GI: soft, non-tender; bowel sounds normal; no masses,  no organomegaly and ascites Extremities: edema tr  Lab Results:  Recent Labs  07/26/15 0440 07/27/15 0915  WBC 3.6* 2.8*  HGB 10.2* 10.4*  HCT 29.3* 30.5*  PLT 25* 24*   BMET:  Recent Labs  07/26/15 0440 07/27/15 0915  NA 136 133*  K 4.7 4.7  CL 103 104  CO2 23 22  GLUCOSE 128* 143*  BUN 41* 37*  CREATININE 2.41* 2.01*  CALCIUM 10.0 9.1   No results for input(s): PTH in the last 72 hours. Iron Studies: No results for input(s): IRON, TIBC, TRANSFERRIN, FERRITIN in the last 72 hours. Studies/Results: US Renal  07/27/2015  CLINICAL DATA:  Acute renal failure. EXAM: RENAL / URINARY TRACT ULTRASOUND COMPLETE COMPARISON:  Multiple prior ultrasounds for paracentesis localization. FINDINGS: Right Kidney: Length: 11.1 cm. Echogenicity within normal limits. No mass or hydronephrosis visualized. Left Kidney: Length: 11.8 cm. Echogenicity within normal limits. No mass or hydronephrosis visualized. Bladder: Decompressed by Foley catheter. Additional findings:  Splenomegaly.  Ascites. IMPRESSION: No hydronephrosis. Normal renal architecture. No evidence for bladder  outlet obstruction. Electronically Signed   By: Staci Righter M.D.   On: 07/27/2015 10:04    Scheduled: . amoxicillin  500 mg Oral TID  . baclofen  20 mg Oral QHS  . bethanechol  50 mg Oral BID  . feeding supplement (PRO-STAT SUGAR FREE 64)  30 mL Oral TID  . fluconazole  100 mg Oral Daily  . FLUoxetine  10 mg Oral Daily  . gabapentin  300 mg Oral TID  . insulin aspart  0-15 Units Subcutaneous TID WC  . insulin aspart  0-5 Units Subcutaneous QHS  . Insulin Degludec  20 Units Subcutaneous Daily  . lactulose  300 mL Rectal TID  . multivitamin with minerals  1 tablet Oral Daily  . pantoprazole  40 mg Oral Daily  . rifaximin  550 mg Oral BID  . tamsulosin  0.4 mg Oral QPC supper  . [START ON 07/31/2015] Vitamin D (Ergocalciferol)  50,000 Units Oral Q7 days    LOS: 2 days   Lennox Dolberry C 07/27/2015,2:31 PM

## 2015-07-28 DIAGNOSIS — K744 Secondary biliary cirrhosis: Secondary | ICD-10-CM | POA: Insufficient documentation

## 2015-07-28 LAB — COMPREHENSIVE METABOLIC PANEL
ALBUMIN: 3.4 g/dL — AB (ref 3.5–5.0)
ALT: 17 U/L (ref 17–63)
AST: 23 U/L (ref 15–41)
Alkaline Phosphatase: 55 U/L (ref 38–126)
Anion gap: 7 (ref 5–15)
BILIRUBIN TOTAL: 1.5 mg/dL — AB (ref 0.3–1.2)
BUN: 37 mg/dL — AB (ref 6–20)
CHLORIDE: 105 mmol/L (ref 101–111)
CO2: 23 mmol/L (ref 22–32)
Calcium: 9.1 mg/dL (ref 8.9–10.3)
Creatinine, Ser: 1.9 mg/dL — ABNORMAL HIGH (ref 0.61–1.24)
GFR calc Af Amer: 42 mL/min — ABNORMAL LOW (ref >60)
GFR calc non Af Amer: 36 mL/min — ABNORMAL LOW (ref >60)
GLUCOSE: 124 mg/dL — AB (ref 65–99)
POTASSIUM: 5 mmol/L (ref 3.5–5.1)
Sodium: 135 mmol/L (ref 135–145)
Total Protein: 6.1 g/dL — ABNORMAL LOW (ref 6.5–8.1)

## 2015-07-28 LAB — CBC
HCT: 28.4 % — ABNORMAL LOW (ref 39.0–52.0)
Hemoglobin: 10.1 g/dL — ABNORMAL LOW (ref 13.0–17.0)
MCH: 31.8 pg (ref 26.0–34.0)
MCHC: 35.6 g/dL (ref 30.0–36.0)
MCV: 89.3 fL (ref 78.0–100.0)
PLATELETS: 24 10*3/uL — AB (ref 150–400)
RBC: 3.18 MIL/uL — AB (ref 4.22–5.81)
RDW: 15.6 % — ABNORMAL HIGH (ref 11.5–15.5)
WBC: 3 10*3/uL — AB (ref 4.0–10.5)

## 2015-07-28 LAB — GLUCOSE, CAPILLARY
GLUCOSE-CAPILLARY: 197 mg/dL — AB (ref 65–99)
Glucose-Capillary: 182 mg/dL — ABNORMAL HIGH (ref 65–99)
Glucose-Capillary: 106 mg/dL — ABNORMAL HIGH (ref 65–99)
Glucose-Capillary: 200 mg/dL — ABNORMAL HIGH (ref 65–99)

## 2015-07-28 LAB — AMMONIA: AMMONIA: 49 umol/L — AB (ref 9–35)

## 2015-07-28 MED ORDER — LACTULOSE 10 GM/15ML PO SOLN
30.0000 g | Freq: Three times a day (TID) | ORAL | Status: DC
  Administered 2015-07-28 – 2015-07-31 (×9): 30 g via ORAL
  Filled 2015-07-28 (×9): qty 60

## 2015-07-28 MED ORDER — LIP MEDEX EX OINT
TOPICAL_OINTMENT | CUTANEOUS | Status: DC | PRN
  Filled 2015-07-28 (×2): qty 7

## 2015-07-28 NOTE — Progress Notes (Signed)
Assessment: 1 Acute on CKD3 - slight improvement, creat 1.90 today.  Suspect hemodynamic. Baseline creat 1.5, peak creat here 2.45. UA bland. Progressive moderately severe renal failure it appears since May '16. No evid GN by UA. UNa 19. Poor prognosis overall. Not HD candidate w comorbidities. Have d/w [name]'s wife.  2 Cirrhosis/ NASH - f/b [facility], wife says not on Tx list yet, needs to "get stronger" 3 DM2 4 Prot-cal malnutrition 5 AMS d/t hepatic enceph- better  Plan- check creat in am. Repeat urine lytes. DC'd IVF's .   Subjective: Interval History: no c/o's  Objective: Vital signs in last 24 hours: Temp:  [98.2 F (36.8 C)-98.7 F (37.1 C)] 98.3 F (36.8 C) (01/21 1415) Pulse Rate:  [70-72] 72 (01/21 1415) Resp:  [18-20] 18 (01/21 1415) BP: (103-119)/(65-75) 103/70 mmHg (01/21 1415) SpO2:  [99 %-100 %] 100 % (01/21 1415) Weight:  [81 kg (178 lb 9.2 oz)] 81 kg (178 lb 9.2 oz) (01/21 0536) Weight change: 2.8 kg (6 lb 2.8 oz)  Intake/Output from previous day: 01/20 0701 - 01/21 0700 In: 3520 [P.O.:1120; I.V.:2400] Out: 850 [Urine:850] Intake/Output this shift:   Exam:  General appearance: alert and cooperative GI: soft, non-tender; 3+ tense ascites Extremities: edema tr Neuro no asterixis  Lab Results:  Recent Labs  07/27/15 0915 07/28/15 0534  WBC 2.8* 3.0*  HGB 10.4* 10.1*  HCT 30.5* 28.4*  PLT 24* 24*   BMET:   Recent Labs  07/27/15 0915 07/28/15 0534  NA 133* 135  K 4.7 5.0  CL 104 105  CO2 22 23  GLUCOSE 143* 124*  BUN 37* 37*  CREATININE 2.01* 1.90*  CALCIUM 9.1 9.1   No results for input(s): PTH in the last 72 hours. Iron Studies: No results for input(s): IRON, TIBC, TRANSFERRIN, FERRITIN in the last 72 hours. Studies/Results: US Renal  07/27/2015  CLINICAL DATA:  Acute renal failure. EXAM: RENAL / URINARY TRACT ULTRASOUND COMPLETE COMPARISON:  Multiple prior ultrasounds for paracentesis localization. FINDINGS: Right Kidney: Length: 11.1 cm.  Echogenicity within normal limits. No mass or hydronephrosis visualized. Left Kidney: Length: 11.8 cm. Echogenicity within normal limits. No mass or hydronephrosis visualized. Bladder: Decompressed by Foley catheter. Additional findings:  Splenomegaly.  Ascites. IMPRESSION: No hydronephrosis. Normal renal architecture. No evidence for bladder outlet obstruction. Electronically Signed   By: Staci Righter M.D.   On: 07/27/2015 10:04    Scheduled: . amoxicillin  500 mg Oral TID  . baclofen  20 mg Oral QHS  . bethanechol  50 mg Oral BID  . feeding supplement (PRO-STAT SUGAR FREE 64)  30 mL Oral TID  . fluconazole  100 mg Oral Daily  . FLUoxetine  10 mg Oral Daily  . gabapentin  300 mg Oral TID  . insulin aspart  0-15 Units Subcutaneous TID WC  . insulin aspart  0-5 Units Subcutaneous QHS  . Insulin Degludec  20 Units Subcutaneous Daily  . lactulose  30 g Oral TID  . multivitamin with minerals  1 tablet Oral Daily  . pantoprazole  40 mg Oral Daily  . rifaximin  550 mg Oral BID  . tamsulosin  0.4 mg Oral QPC supper  . [START ON 07/31/2015] Vitamin D (Ergocalciferol)  50,000 Units Oral Q7 days    LOS: 3 days   Calea Hribar D 07/28/2015,7:37 PM

## 2015-07-28 NOTE — Progress Notes (Signed)
[facility] PROGRESS NOTE  [name] [id number] DOB: 1950-08-01 DOA: 07/25/2015 PCP: [name], MD  Assessment/Plan:   Acute hepatic encephalopathy ([place]) - GI on board and assisting - on lactulose and rifaximin  -improving,   Depression - stable on prozac  AKI; appreciated renal assistance.  - improving and most likely due to dehydration, fluids shift from paracentesis.  -defer to renal management of fluids.     Controlled type 2 diabetes mellitus with diabetic nephropathy,  - Stable on SSI -on lantus.   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 He is on chronic amoxicillin for this.    Diabetic neuropathy ([place]) -on gabapentin, will ask pharmacy to adjust dose for renal function.     Coagulopathy (HCC)   NASH (nonalcoholic steatohepatitis)   Thrombocytopenia; stable, secondary to     Protein-calorie malnutrition, severe - continue MV    ESOPHAGEAL STRICTURE - GI on board  Code Status:  full Family Communication: discussed with patient. Disposition Plan: pending improvement in condition, renal failure and hepatic encephalopathy   Consultants:  GI  nephrology  Procedures:  Radiology  Antibiotics:  Amoxicillin  Rifaximin  HPI/Subjective: He is alert, no distress. Feeling better. Eat big breakfast.   Objective: Filed Vitals:   07/27/15 2127 07/28/15 0536  BP: 113/65 119/75  Pulse: 70 72  Temp: 98.7 F (37.1 C) 98.2 F (36.8 C)  Resp: 20 18    Intake/Output Summary (Last 24 hours) at 07/28/15 1018 Last data filed at 07/28/15 0800  Gross per 24 hour  Intake   3120 ml  Output    950 ml  Net   2170 ml   Filed Weights   07/26/15 0456 07/27/15 0457 07/28/15 0536  Weight: 76.6 kg (168 lb 14 oz) 78.2 kg (172 lb 6.4 oz) 81 kg (178 lb 9.2 oz)    Exam:   General:  Alert in no distress  Cardiovascular:  rrr, no rubs  Respiratory: CTA  Abdomen: soft, no guarding  Musculoskeletal: no cyanosis    Data Reviewed: Basic Metabolic Panel:  Recent Labs Lab 07/25/15 1144 07/25/15 2132 07/26/15 0440 07/27/15 0915 07/28/15 0534  NA 133* 136 136 133* 135  K 4.7 5.1 4.7 4.7 5.0  CL 101 102 103 104 105  CO2 21* 23 23 22 23   GLUCOSE 189* 159* 128* 143* 124*  BUN 38* 39* 41* 37* 37*  CREATININE 2.44* 2.34* 2.41* 2.01* 1.90*  CALCIUM 10.0 10.0 10.0 9.1 9.1  MG  --  2.2  --   --   --   PHOS  --  3.6  --   --   --    Liver Function Tests:  Recent Labs Lab 07/25/15 1144 07/25/15 2132 07/26/15 0440 07/27/15 0915 07/28/15 0534  AST 24 25 24 23 23   ALT 20 19 19 19 17   ALKPHOS 57 60 56 55 55  BILITOT 1.6* 2.0* 2.2* 2.7* 1.5*  PROT 6.5 6.8 6.6 6.2* 6.1*  ALBUMIN 3.7 3.9 3.9 3.6 3.4*    Recent Labs Lab 07/25/15 1144  LIPASE 55*    Recent Labs Lab 07/25/15 1143 07/26/15 1218 07/27/15 1050 07/28/15 0534  AMMONIA 144* 90* 38* 49*   CBC:  Recent Labs Lab 07/25/15 1144 07/25/15 2132 07/26/15 0440 07/27/15 0915 07/28/15 0534  WBC 3.3* 3.1* 3.6* 2.8* 3.0*  NEUTROABS 2.6 2.4  --   --   --  HGB 10.3* 10.3* 10.2* 10.4* 10.1*  HCT 29.4* 29.8* 29.3* 30.5* 28.4*  MCV 89.6 90.0 90.4 91.6 89.3  PLT 24* 25* 25* 24* 24*   Cardiac Enzymes: No results for input(s): CKTOTAL, CKMB, CKMBINDEX, TROPONINI in the last 168 hours. BNP (last 3 results)  Recent Labs  07/25/15 1144  BNP 75.7    ProBNP (last 3 results) No results for input(s): PROBNP in the last 8760 hours.  CBG:  Recent Labs Lab 07/27/15 0730 07/27/15 1221 07/27/15 1642 07/27/15 2126 07/28/15 0734  GLUCAP 127* 244* 154* 182* 106*    No results found for this or any previous visit (from the past 240 hour(s)).   Studies: US Renal  07/27/2015  CLINICAL DATA:  Acute renal failure. EXAM: RENAL / URINARY TRACT ULTRASOUND COMPLETE COMPARISON:  Multiple prior ultrasounds for paracentesis localization.  FINDINGS: Right Kidney: Length: 11.1 cm. Echogenicity within normal limits. No mass or hydronephrosis visualized. Left Kidney: Length: 11.8 cm. Echogenicity within normal limits. No mass or hydronephrosis visualized. Bladder: Decompressed by Foley catheter. Additional findings:  Splenomegaly.  Ascites. IMPRESSION: No hydronephrosis. Normal renal architecture. No evidence for bladder outlet obstruction. Electronically Signed   By: Staci Righter M.D.   On: 07/27/2015 10:04    Scheduled Meds: . amoxicillin  500 mg Oral TID  . baclofen  20 mg Oral QHS  . bethanechol  50 mg Oral BID  . feeding supplement (PRO-STAT SUGAR FREE 64)  30 mL Oral TID  . fluconazole  100 mg Oral Daily  . FLUoxetine  10 mg Oral Daily  . gabapentin  300 mg Oral TID  . insulin aspart  0-15 Units Subcutaneous TID WC  . insulin aspart  0-5 Units Subcutaneous QHS  . Insulin Degludec  20 Units Subcutaneous Daily  . lactulose  300 mL Rectal TID  . multivitamin with minerals  1 tablet Oral Daily  . pantoprazole  40 mg Oral Daily  . rifaximin  550 mg Oral BID  . tamsulosin  0.4 mg Oral QPC supper  . [START ON 07/31/2015] Vitamin D (Ergocalciferol)  50,000 Units Oral Q7 days   Continuous Infusions: . sodium chloride 100 mL/hr (07/27/15 1822)    Time spent: > 35 minutes  Lovada Barwick A  [facility] Pager [phone] If 7PM-7AM, please contact night-coverage at [email or web address], password [facility] 07/28/2015, 10:18 AM  LOS: 3 days

## 2015-07-28 NOTE — Progress Notes (Signed)
     [facility] Progress Note  Subjective:  Hgb 10.1, platelets 24K. Ammonia 49.BUN 37, creat 1.90.Alert today, some mild slurring of speech. Says she prefers the lactulose enemas over oral lactulose because oral tastes so bad he sometimes doesn't take it.   Objective:  Vital signs in last 24 hours: Temp:  [98.1 F (36.7 C)-98.7 F (37.1 C)] 98.2 F (36.8 C) (01/21 0536) Pulse Rate:  [70-80] 72 (01/21 0536) Resp:  [18-20] 18 (01/21 0536) BP: (105-119)/(62-75) 119/75 mmHg (01/21 0536) SpO2:  [99 %-100 %] 100 % (01/21 0536) Weight:  [178 lb 9.2 oz (81 kg)] 178 lb 9.2 oz (81 kg) (01/21 0536) Last BM Date: 07/27/15 General:   Alert,  Well-developed,    in NAD Heart:  Regular rate and rhythm; no murmurs Pulm;lungs clear Abdomen:  Soft, nontender, distended.Normal bowel sounds, without guarding, and without rebound.   Extremities:  Without edema. Neurologic: Alert and  oriented x4;  grossly normal neurologically. Psych:  Alert and cooperative. Normal mood and affect.  Intake/Output from previous day: 01/20 0701 - 01/21 0700 In: 3520 [P.O.:1120; I.V.:2400] Out: 850 [Urine:850] Intake/Output this shift: Total I/O In: -  Out: 100 [Urine:100]  Lab Results:  Recent Labs  07/26/15 0440 07/27/15 0915 07/28/15 0534  WBC 3.6* 2.8* 3.0*  HGB 10.2* 10.4* 10.1*  HCT 29.3* 30.5* 28.4*  PLT 25* 24* 24*   BMET  Recent Labs  07/26/15 0440 07/27/15 0915 07/28/15 0534  NA 136 133* 135  K 4.7 4.7 5.0  CL 103 104 105  CO2 23 22 23   GLUCOSE 128* 143* 124*  BUN 41* 37* 37*  CREATININE 2.41* 2.01* 1.90*  CALCIUM 10.0 9.1 9.1   LFT  Recent Labs  07/28/15 0534  PROT 6.1*  ALBUMIN 3.4*  AST 23  ALT 17  ALKPHOS 32  BILITOT 1.5*      ASSESSMENT/PLAN:  65 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 Likely secondary to HE. Mental status much improved today. Continue lactulose eneam tid--pt does not  want to change to po lactulose. Renal status improving. Pt likely not a candidate for tips due to ongoing infection for which he is on amoxicillin. May be able to restart low dose diuretics tomorrow if renal function continues to improve.( if approved by nephrology). Pt is asking for paracentesis prior to d/c--will discuss with attending. Likely will not recommend removing more that 3-4 liters at a time.   LOS: 3 days   Hvozdovic, Vita Barley PA-C 07/28/2015, Pager [phone] Mon-Fri 8a-5p [phone] after 5p, weekends, holidays    [facility] Attending   I have taken an interval history, reviewed the chart and examined the patient. I agree with the Advanced Practitioner's note, impression and recommendations.   Hepatic encephalopathy improving  He takes lactulose in ginger ale at home - will do that No diuretics or paracentesis yet - await f/u creat and nephrology Smaller taps for sure   Gatha Mayer, MD, [facility] [phone] (pager) [phone] after 5 PM, weekends and holidays  07/28/2015 4:28 PM

## 2015-07-29 LAB — GLUCOSE, CAPILLARY
GLUCOSE-CAPILLARY: 182 mg/dL — AB (ref 65–99)
GLUCOSE-CAPILLARY: 283 mg/dL — AB (ref 65–99)
Glucose-Capillary: 257 mg/dL — ABNORMAL HIGH (ref 65–99)
Glucose-Capillary: 233 mg/dL — ABNORMAL HIGH (ref 65–99)
Glucose-Capillary: 186 mg/dL — ABNORMAL HIGH (ref 65–99)

## 2015-07-29 LAB — SODIUM, URINE, RANDOM: Sodium, Ur: 21 mmol/L

## 2015-07-29 LAB — CBC
HCT: 30 % — ABNORMAL LOW (ref 39.0–52.0)
Hemoglobin: 10.2 g/dL — ABNORMAL LOW (ref 13.0–17.0)
MCH: 31.2 pg (ref 26.0–34.0)
MCHC: 34 g/dL (ref 30.0–36.0)
MCV: 91.7 fL (ref 78.0–100.0)
PLATELETS: 27 10*3/uL — AB (ref 150–400)
RBC: 3.27 MIL/uL — AB (ref 4.22–5.81)
RDW: 15.9 % — AB (ref 11.5–15.5)
WBC: 3.8 10*3/uL — AB (ref 4.0–10.5)

## 2015-07-29 LAB — COMPREHENSIVE METABOLIC PANEL
ALK PHOS: 56 U/L (ref 38–126)
ALT: 17 U/L (ref 17–63)
ANION GAP: 8 (ref 5–15)
AST: 22 U/L (ref 15–41)
Albumin: 3.2 g/dL — ABNORMAL LOW (ref 3.5–5.0)
BILIRUBIN TOTAL: 1.5 mg/dL — AB (ref 0.3–1.2)
BUN: 34 mg/dL — ABNORMAL HIGH (ref 6–20)
CALCIUM: 8.7 mg/dL — AB (ref 8.9–10.3)
CO2: 17 mmol/L — ABNORMAL LOW (ref 22–32)
Chloride: 108 mmol/L (ref 101–111)
Creatinine, Ser: 1.53 mg/dL — ABNORMAL HIGH (ref 0.61–1.24)
GFR calc non Af Amer: 47 mL/min — ABNORMAL LOW (ref >60)
GFR, EST AFRICAN AMERICAN: 55 mL/min — AB (ref >60)
Glucose, Bld: 209 mg/dL — ABNORMAL HIGH (ref 65–99)
Potassium: 4.6 mmol/L (ref 3.5–5.1)
Sodium: 133 mmol/L — ABNORMAL LOW (ref 135–145)
TOTAL PROTEIN: 5.7 g/dL — AB (ref 6.5–8.1)

## 2015-07-29 LAB — PROTIME-INR
INR: 1.61 — ABNORMAL HIGH (ref 0.00–1.49)
Prothrombin Time: 19.2 seconds — ABNORMAL HIGH (ref 11.6–15.2)

## 2015-07-29 LAB — AMMONIA: Ammonia: 58 umol/L — ABNORMAL HIGH (ref 9–35)

## 2015-07-29 LAB — CREATININE, URINE, RANDOM: CREATININE, URINE: 134.59 mg/dL

## 2015-07-29 MED ORDER — GABAPENTIN 300 MG PO CAPS
300.0000 mg | ORAL_CAPSULE | Freq: Two times a day (BID) | ORAL | Status: DC
  Administered 2015-07-29 – 2015-07-31 (×4): 300 mg via ORAL
  Filled 2015-07-29 (×4): qty 1

## 2015-07-29 MED ORDER — ALBUMIN HUMAN 25 % IV SOLN
12.5000 g | Freq: Two times a day (BID) | INTRAVENOUS | Status: AC
  Administered 2015-07-29 (×2): 12.5 g via INTRAVENOUS
  Filled 2015-07-29 (×2): qty 50

## 2015-07-29 NOTE — Progress Notes (Signed)
     [facility] Progress Note  Subjective: BUN/creat improving. Ammonia 58; pt says he feels good and would like to be able to ambulate in hall.Abd more distended--says it feels tight. No SOB.   Objective:  Vital signs in last 24 hours: Temp:  [98.3 F (36.8 C)-98.6 F (37 C)] 98.6 F (37 C) (01/22 0543) Pulse Rate:  [72-88] 88 (01/22 0543) Resp:  [18] 18 (01/22 0543) BP: (103-116)/(64-70) 112/64 mmHg (01/22 0543) SpO2:  [100 %] 100 % (01/22 0543) Weight:  [178 lb 2.1 oz (80.8 kg)] 178 lb 2.1 oz (80.8 kg) (01/22 0543) Last BM Date: 07/28/15 General:   Alert,  Well-developed,    in NAD Heart:  Regular rate and rhythm; no murmurs Pulm;lungs clear Abdomen:  tense, nontender,distended. Normal bowel sounds  Extremities:  trace edema. Neurologic:  Alert and  oriented x4; no asterixis   Intake/Output from previous day: 01/21 0701 - 01/22 0700 In: 930 [P.O.:650; I.V.:280] Out: 975 [Urine:975] Intake/Output this shift:    Lab Results:  Recent Labs  07/27/15 0915 07/28/15 0534 07/29/15 0505  WBC 2.8* 3.0* 3.8*  HGB 10.4* 10.1* 10.2*  HCT 30.5* 28.4* 30.0*  PLT 24* 24* 27*   BMET  Recent Labs  07/27/15 0915 07/28/15 0534 07/29/15 0505  NA 133* 135 133*  K 4.7 5.0 4.6  CL 104 105 108  CO2 22 23 17*  GLUCOSE 143* 124* 209*  BUN 37* 37* 34*  CREATININE 2.01* 1.90* 1.53*  CALCIUM 9.1 9.1 8.7*   LFT  Recent Labs  07/29/15 0505  PROT 5.7*  ALBUMIN 3.2*  AST 22  ALT 17  ALKPHOS 49  BILITOT 1.5*       ASSESSMENT/PLAN:     65 yo male with NASH cirrhosis, portal HTN, ascites, encephalopathy,thrombocytopenia, admitted with AMS, likely secondary to HE. HE improving. Creat 1.53 today. Continue lactulose and xifaxan. Will review with attending as to possible paracentesis of 3 liters tentatively  tomorrow. Will give albumin today.        LOS: 4 days   Hvozdovic, Vita Barley PA-C 07/29/2015, Pager [phone] Mon-Fri 8a-5p [phone] after 5p,  weekends, holidays    [facility] Attending   I have taken an interval history, reviewed the chart and examined the patient. I agree with the Advanced Practitioner's note, impression and recommendations.    Overall better kidney fx and hepatic encephalopathy Ascites a problem Renal says limit paracentesis to 2 L so I changed orders Will f/u in AM  Fluid management getting difficult  Gatha Mayer, MD, [facility] [phone] (pager) [phone] after 5 PM, weekends and holidays  07/29/2015 10:36 AM

## 2015-07-29 NOTE — Progress Notes (Signed)
Subjective: Interval History: has complaints . abdm cont to swell.  Objective: Vital signs in last 24 hours: Temp:  [98.3 F (36.8 C)-98.6 F (37 C)] 98.6 F (37 C) (01/22 0543) Pulse Rate:  [72-88] 88 (01/22 0543) Resp:  [18] 18 (01/22 0543) BP: (103-116)/(64-70) 112/64 mmHg (01/22 0543) SpO2:  [100 %] 100 % (01/22 0543) Weight:  [80.8 kg (178 lb 2.1 oz)] 80.8 kg (178 lb 2.1 oz) (01/22 0543) Weight change: -0.2 kg (-7.1 oz)  Intake/Output from previous day: 01/21 0701 - 01/22 0700 In: 930 [P.O.:650; I.V.:280] Out: 975 [Urine:975] Intake/Output this shift:    General appearance: alert, cooperative and no distress Resp: diminished breath sounds bibasilar Cardio: S1, S2 normal GI: pos bs, distended, pos FW,  Extremities: edema 1+  Lab Results:  Recent Labs  07/28/15 0534 07/29/15 0505  WBC 3.0* 3.8*  HGB 10.1* 10.2*  HCT 28.4* 30.0*  PLT 24* 27*   BMET:  Recent Labs  07/28/15 0534 07/29/15 0505  NA 135 133*  K 5.0 4.6  CL 105 108  CO2 23 17*  GLUCOSE 124* 209*  BUN 37* 34*  CREATININE 1.90* 1.53*  CALCIUM 9.1 8.7*   No results for input(s): PTH in the last 72 hours. Iron Studies: No results for input(s): IRON, TIBC, TRANSFERRIN, FERRITIN in the last 72 hours.  Studies/Results: No results found.  I have reviewed the patient's current medications.  Assessment/Plan: 1 AKI secondary to large vol paracentesis, Need to avoid in future.  Limit vol off to 2 Liters. 2 Cirrhosis 3 DM 4 Anemia P need to d/c ivf,  Limit paracentesis to 2 liters,, will s/o at this time    LOS: 4 days   [name] L 07/29/2015,9:28 AM

## 2015-07-29 NOTE — Progress Notes (Signed)
[facility] PROGRESS NOTE  EZE TONTHAT [id number] DOB: 1951/02/26 DOA: 07/25/2015 PCP: [name], MD  Assessment/Plan:   Acute hepatic encephalopathy ([place]) - GI on board and assisting - on lactulose and rifaximin  -improving,   Depression - stable on prozac  AKI; appreciated renal assistance.  - improving and most likely due to dehydration, fluids shift from paracentesis.  -NSL . Renal function improved.     Controlled type 2 diabetes mellitus with diabetic nephropathy,  - Stable on SSI -on lantus.   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 He is on chronic amoxicillin for this.    Diabetic neuropathy (HCC) -on gabapentin.    Coagulopathy (HCC)   NASH (nonalcoholic steatohepatitis)   Thrombocytopenia; stable, secondary to cirrhosis Plan for paracentesis 1-23, no more than 2 L.     Protein-calorie malnutrition, severe - continue MV    ESOPHAGEAL STRICTURE - GI on board  Code Status:  full Family Communication: discussed with patient. Disposition Plan: pending improvement in condition, renal failure and hepatic encephalopathy, paracentesis 1-23   Consultants:  GI  nephrology  Procedures:  Radiology  Antibiotics:  Amoxicillin  Rifaximin  HPI/Subjective: He is alert, no distress. Feels abdomen distended. Tight.   Objective: Filed Vitals:   07/28/15 2230 07/29/15 0543  BP: 116/66 112/64  Pulse: 87 88  Temp: 98.6 F (37 C) 98.6 F (37 C)  Resp: 18 18    Intake/Output Summary (Last 24 hours) at 07/29/15 1056 Last data filed at 07/28/15 2231  Gross per 24 hour  Intake    530 ml  Output    875 ml  Net   -345 ml   Filed Weights   07/27/15 0457 07/28/15 0536 07/29/15 0543  Weight: 78.2 kg (172 lb 6.4 oz) 81 kg (178 lb 9.2 oz) 80.8 kg (178 lb 2.1 oz)    Exam:   General:  Alert in no  distress  Cardiovascular: rrr, no rubs  Respiratory: CTA  Abdomen: soft, no guarding  Musculoskeletal: no cyanosis    Data Reviewed: Basic Metabolic Panel:  Recent Labs Lab 07/25/15 2132 07/26/15 0440 07/27/15 0915 07/28/15 0534 07/29/15 0505  NA 136 136 133* 135 133*  K 5.1 4.7 4.7 5.0 4.6  CL 102 103 104 105 108  CO2 23 23 22 23  17*  GLUCOSE 159* 128* 143* 124* 209*  BUN 39* 41* 37* 37* 34*  CREATININE 2.34* 2.41* 2.01* 1.90* 1.53*  CALCIUM 10.0 10.0 9.1 9.1 8.7*  MG 2.2  --   --   --   --   PHOS 3.6  --   --   --   --    Liver Function Tests:  Recent Labs Lab 07/25/15 2132 07/26/15 0440 07/27/15 0915 07/28/15 0534 07/29/15 0505  AST 25 24 23 23 22   ALT 19 19 19 17 17   ALKPHOS 60 56 55 55 56  BILITOT 2.0* 2.2* 2.7* 1.5* 1.5*  PROT 6.8 6.6 6.2* 6.1* 5.7*  ALBUMIN 3.9 3.9 3.6 3.4* 3.2*    Recent Labs Lab 07/25/15 1144  LIPASE 55*    Recent Labs Lab 07/25/15 1143 07/26/15 1218 07/27/15 1050 07/28/15 0534 07/29/15 0505  AMMONIA 144* 90* 38* 49* 58*   CBC:  Recent Labs Lab 07/25/15 1144 07/25/15 2132 07/26/15 0440 07/27/15 0915 07/28/15 0534 07/29/15 0505  WBC 3.3* 3.1* 3.6* 2.8* 3.0* 3.8*  NEUTROABS 2.6 2.4  --   --   --   --  HGB 10.3* 10.3* 10.2* 10.4* 10.1* 10.2*  HCT 29.4* 29.8* 29.3* 30.5* 28.4* 30.0*  MCV 89.6 90.0 90.4 91.6 89.3 91.7  PLT 24* 25* 25* 24* 24* 27*   Cardiac Enzymes: No results for input(s): CKTOTAL, CKMB, CKMBINDEX, TROPONINI in the last 168 hours. BNP (last 3 results)  Recent Labs  07/25/15 1144  BNP 75.7    ProBNP (last 3 results) No results for input(s): PROBNP in the last 8760 hours.  CBG:  Recent Labs Lab 07/28/15 0734 07/28/15 1201 07/28/15 1728 07/28/15 2224 07/29/15 0744  GLUCAP 106* 200* 197* 257* 182*    No results found for this or any previous visit (from the past 240 hour(s)).   Studies: No results found.  Scheduled Meds: . albumin human  12.5 g Intravenous q12n4p  .  amoxicillin  500 mg Oral TID  . baclofen  20 mg Oral QHS  . bethanechol  50 mg Oral BID  . feeding supplement (PRO-STAT SUGAR FREE 64)  30 mL Oral TID  . fluconazole  100 mg Oral Daily  . FLUoxetine  10 mg Oral Daily  . gabapentin  300 mg Oral TID  . insulin aspart  0-15 Units Subcutaneous TID WC  . insulin aspart  0-5 Units Subcutaneous QHS  . Insulin Degludec  20 Units Subcutaneous Daily  . lactulose  30 g Oral TID  . multivitamin with minerals  1 tablet Oral Daily  . pantoprazole  40 mg Oral Daily  . rifaximin  550 mg Oral BID  . tamsulosin  0.4 mg Oral QPC supper  . [START ON 07/31/2015] Vitamin D (Ergocalciferol)  50,000 Units Oral Q7 days   Continuous Infusions:    Time spent: > 35 minutes  Regalado, Belkys A  [facility] Pager [phone] If 7PM-7AM, please contact night-coverage at [email or web address], password [facility] 07/29/2015, 10:56 AM  LOS: 4 days

## 2015-07-30 ENCOUNTER — Inpatient Hospital Stay (HOSPITAL_COMMUNITY): Payer: Managed Care, Other (non HMO)

## 2015-07-30 DIAGNOSIS — D696 Thrombocytopenia, unspecified: Secondary | ICD-10-CM

## 2015-07-30 DIAGNOSIS — K7460 Unspecified cirrhosis of liver: Secondary | ICD-10-CM

## 2015-07-30 LAB — BASIC METABOLIC PANEL
ANION GAP: 8 (ref 5–15)
BUN: 41 mg/dL — ABNORMAL HIGH (ref 6–20)
CALCIUM: 9.1 mg/dL (ref 8.9–10.3)
CO2: 16 mmol/L — AB (ref 22–32)
Chloride: 109 mmol/L (ref 101–111)
Creatinine, Ser: 1.34 mg/dL — ABNORMAL HIGH (ref 0.61–1.24)
GFR, EST NON AFRICAN AMERICAN: 55 mL/min — AB (ref >60)
Glucose, Bld: 177 mg/dL — ABNORMAL HIGH (ref 65–99)
Potassium: 4.4 mmol/L (ref 3.5–5.1)
SODIUM: 133 mmol/L — AB (ref 135–145)

## 2015-07-30 LAB — GLUCOSE, CAPILLARY
GLUCOSE-CAPILLARY: 145 mg/dL — AB (ref 65–99)
GLUCOSE-CAPILLARY: 263 mg/dL — AB (ref 65–99)
GLUCOSE-CAPILLARY: 221 mg/dL — AB (ref 65–99)
GLUCOSE-CAPILLARY: 226 mg/dL — AB (ref 65–99)

## 2015-07-30 LAB — CBC
HEMATOCRIT: 28.7 % — AB (ref 39.0–52.0)
Hemoglobin: 10 g/dL — ABNORMAL LOW (ref 13.0–17.0)
MCH: 31.1 pg (ref 26.0–34.0)
MCHC: 34.8 g/dL (ref 30.0–36.0)
MCV: 89.1 fL (ref 78.0–100.0)
Platelets: 23 10*3/uL — CL (ref 150–400)
RBC: 3.22 MIL/uL — ABNORMAL LOW (ref 4.22–5.81)
RDW: 15.9 % — AB (ref 11.5–15.5)
WBC: 3.2 10*3/uL — AB (ref 4.0–10.5)

## 2015-07-30 LAB — BODY FLUID CELL COUNT WITH DIFFERENTIAL
Lymphs, Fluid: 24 %
Monocyte-Macrophage-Serous Fluid: 71 % (ref 50–90)
NEUTROPHIL FLUID: 5 % (ref 0–25)
Total Nucleated Cell Count, Fluid: 87 cu mm (ref 0–1000)

## 2015-07-30 MED ORDER — SODIUM BICARBONATE 650 MG PO TABS
650.0000 mg | ORAL_TABLET | Freq: Two times a day (BID) | ORAL | Status: DC
  Administered 2015-07-30 (×2): 650 mg via ORAL
  Filled 2015-07-30 (×2): qty 1

## 2015-07-30 MED ORDER — ALBUMIN HUMAN 25 % IV SOLN
25.0000 g | Freq: Once | INTRAVENOUS | Status: AC
  Administered 2015-07-30: 25 g via INTRAVENOUS
  Filled 2015-07-30: qty 100

## 2015-07-30 NOTE — Plan of Care (Signed)
Problem: Education: Goal: Knowledge of [facility] Education information/materials will improve Outcome: Completed/Met Date Met:  07/30/15 .  Problem: Health Behavior/Discharge Planning: Goal: Ability to manage health-related needs will improve Continue.    Problem: Pain Managment: Goal: General experience of comfort will improve Outcome: Completed/Met Date Met:  07/30/15 .  Problem: Physical Regulation: Goal: Ability to maintain clinical measurements within normal limits will improve Continue.   Goal: Will remain free from infection Outcome: Progressing Continue.    Problem: Skin Integrity: Goal: Risk for impaired skin integrity will decrease Continue.    Problem: Activity: Goal: Risk for activity intolerance will decrease Continue.

## 2015-07-30 NOTE — Progress Notes (Signed)
     [facility] Progress Note  Subjective:   Hgb 10, platelets 23.  INR 1.6. Creat 1.34. For parcentesis today--per nephrology no more than 2 liters to be drawn off at a time. No complaints.   Objective:  Vital signs in last 24 hours: Temp:  [97.8 F (36.6 C)-98.3 F (36.8 C)] 97.8 F (36.6 C) (01/23 0422) Pulse Rate:  [69-87] 69 (01/23 0422) Resp:  [18-20] 20 (01/23 0422) BP: (107-116)/(61-76) 113/75 mmHg (01/23 0422) SpO2:  [99 %-100 %] 100 % (01/23 0422) Weight:  [186 lb 11.7 oz (84.7 kg)] 186 lb 11.7 oz (84.7 kg) (01/23 0422) Last BM Date: 07/30/15 General:   Alert,  Well-developed,    in NAD Heart:  Regular rate and rhythm; no murmurs Pulm;lungs clear Abdomen:  Distended, Normal bowel sounds, without guarding, and without rebound.   Extremities:  1+ LE edema Neurologic  Alert and  oriented x4;  grossly normal neurologically. Psych: Alert and cooperative. Normal mood and affect.  Intake/Output from previous day: 01/22 0701 - 01/23 0700 In: 580 [P.O.:480; IV Piggyback:100] Out: 525 [Urine:525] Intake/Output this shift: Total I/O In: -  Out: 150 [Urine:150]  Lab Results:  Recent Labs  07/28/15 0534 07/29/15 0505 07/30/15 0514  WBC 3.0* 3.8* 3.2*  HGB 10.1* 10.2* 10.0*  HCT 28.4* 30.0* 28.7*  PLT 24* 27* 23*   BMET  Recent Labs  07/28/15 0534 07/29/15 0505 07/30/15 0514  NA 135 133* 133*  K 5.0 4.6 4.4  CL 105 108 109  CO2 23 17* 16*  GLUCOSE 124* 209* 177*  BUN 37* 34* 41*  CREATININE 1.90* 1.53* 1.34*  CALCIUM 9.1 8.7* 9.1   LFT  Recent Labs  07/29/15 0505  PROT 5.7*  ALBUMIN 3.2*  AST 22  ALT 17  ALKPHOS 56  BILITOT 1.5*   PT/INR  Recent Labs  07/29/15 1025  LABPROT 19.2*  INR 1.61*     ASSESSMENT/PLAN:     65 yo male with NASH cirrhosis, portal HTN, ascites, encephalopathy, thrombocytopenia, admitted with AMS, likely secondary to HE, and AKI secondary to large volume paracentesis. HE improving. Cr today  1.34.Will recheck ammonia. Tentatively for paracentesis today-no more than 2 liters. Will review with attending as to plans restarting diuretics. 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         Principal Problem:   Acute hepatic encephalopathy (HCC) Active Problems:   ESOPHAGEAL STRICTURE   Bleeding esophageal varices (HCC)   Thrombocytopenia (HCC)   Ascites   Acute renal failure superimposed on stage 3 chronic kidney disease (HCC)   Anemia of chronic disease   Depression   Controlled type 2 diabetes mellitus with diabetic nephropathy, without long-term current use of insulin (HCC)   Diabetic neuropathy (HCC)   Coagulopathy (HCC)   NASH (nonalcoholic steatohepatitis)   Protein-calorie malnutrition, severe   Secondary biliary cirrhosis ([place])     LOS: 5 days   Hvozdovic, Vita Barley PA-C 07/30/2015, Pager [phone] Mon-Fri 8a-5p [phone] after 5p, weekends, holidays     Attending physician's note   I have taken an interval history, reviewed the chart and examined the patient. I agree with the Advanced Practitioner's note, impression and recommendations.   Lucio Edward, MD [facility] [phone] Mon-Fri 8a-5p [phone] after 5p, weekends, holidays

## 2015-07-30 NOTE — Procedures (Signed)
Successful US guided paracentesis from LLQ.  Yielded a max of 2L of clear yellow fluid as requested No immediate complications.  Pt tolerated well.   Specimen was sent for labs.  Ascencion Dike PA-C 07/30/2015 11:56 AM

## 2015-07-30 NOTE — Progress Notes (Addendum)
[facility] PROGRESS NOTE  [name] [id number] DOB: [birth year] DOA: 07/25/2015 PCP: Jonathan Reichert, MD  Assessment/Plan:   Acute hepatic encephalopathy ([place]) - GI on board and assisting - on lactulose and rifaximin  Resolved.   Depression - stable on prozac  AKI; appreciated renal assistance.  - improving and most likely due to dehydration, fluids shift from paracentesis.  -NSL . Renal function improved.  Repeat labs in am after paracentesis.  Metabolic acidosis, will start bicarb.     Controlled type 2 diabetes mellitus with diabetic nephropathy,  - Stable on SSI -on lantus.   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 He is on chronic amoxicillin for this.    Diabetic neuropathy (HCC) -on gabapentin.    Coagulopathy (HCC)   NASH (nonalcoholic steatohepatitis)   Thrombocytopenia; stable, secondary to cirrhosis   Plan for paracentesis 1-23, no more than 2 L.     Protein-calorie malnutrition, severe - continue MV    ESOPHAGEAL STRICTURE - GI on board  Code Status:  full Family Communication: discussed with patient. Disposition Plan: pending improvement in condition, renal failure and hepatic encephalopathy, paracentesis 1-23, repeat renal function 1-24 after paracentesis. Home 1-24 if renal function stable.    Consultants:  GI  nephrology  Procedures:  Radiology  Antibiotics:  Amoxicillin  Rifaximin  HPI/Subjective: Feeling ok, no new complaints.   Objective: Filed Vitals:   07/30/15 1159 07/30/15 1204  BP: 118/75 127/75  Pulse:    Temp:    Resp:      Intake/Output Summary (Last 24 hours) at 07/30/15 1603 Last data filed at 07/30/15 1259  Gross per 24 hour  Intake    580 ml  Output    300 ml  Net    280 ml   Filed Weights   07/28/15 0536 07/29/15 0543 07/30/15 0422  Weight: 81 kg (178 lb 9.2 oz) 80.8  kg (178 lb 2.1 oz) 84.7 kg (186 lb 11.7 oz)    Exam:   General:  Alert in no distress  Cardiovascular: rrr, no rubs  Respiratory: CTA  Abdomen: soft, no guarding  Musculoskeletal: no cyanosis    Data Reviewed: Basic Metabolic Panel:  Recent Labs Lab 07/25/15 2132 07/26/15 0440 07/27/15 0915 07/28/15 0534 07/29/15 0505 07/30/15 0514  NA 136 136 133* 135 133* 133*  K 5.1 4.7 4.7 5.0 4.6 4.4  CL 102 103 104 105 108 109  CO2 23 23 22 23  17* 16*  GLUCOSE 159* 128* 143* 124* 209* 177*  BUN 39* 41* 37* 37* 34* 41*  CREATININE 2.34* 2.41* 2.01* 1.90* 1.53* 1.34*  CALCIUM 10.0 10.0 9.1 9.1 8.7* 9.1  MG 2.2  --   --   --   --   --   PHOS 3.6  --   --   --   --   --    Liver Function Tests:  Recent Labs Lab 07/25/15 2132 07/26/15 0440 07/27/15 0915 07/28/15 0534 07/29/15 0505  AST 25 24 23 23 22   ALT 19 19 19 17 17   ALKPHOS 60 56 55 55 56  BILITOT 2.0* 2.2* 2.7* 1.5* 1.5*  PROT 6.8 6.6 6.2* 6.1* 5.7*  ALBUMIN 3.9 3.9 3.6 3.4* 3.2*    Recent Labs Lab 07/25/15 1144  LIPASE 55*    Recent Labs Lab 07/25/15 1143 07/26/15 1218 07/27/15 1050 07/28/15 0534 07/29/15 0505  AMMONIA 144* 90* 38*  49* 58*   CBC:  Recent Labs Lab 07/25/15 1144 07/25/15 2132 07/26/15 0440 07/27/15 0915 07/28/15 0534 07/29/15 0505 07/30/15 0514  WBC 3.3* 3.1* 3.6* 2.8* 3.0* 3.8* 3.2*  NEUTROABS 2.6 2.4  --   --   --   --   --   HGB 10.3* 10.3* 10.2* 10.4* 10.1* 10.2* 10.0*  HCT 29.4* 29.8* 29.3* 30.5* 28.4* 30.0* 28.7*  MCV 89.6 90.0 90.4 91.6 89.3 91.7 89.1  PLT 24* 25* 25* 24* 24* 27* 23*   Cardiac Enzymes: No results for input(s): CKTOTAL, CKMB, CKMBINDEX, TROPONINI in the last 168 hours. BNP (last 3 results)  Recent Labs  07/25/15 1144  BNP 75.7    ProBNP (last 3 results) No results for input(s): PROBNP in the last 8760 hours.  CBG:  Recent Labs Lab 07/29/15 1138 07/29/15 1639 07/29/15 2308 07/30/15 0735 07/30/15 1228  GLUCAP 233* 283* 186* 145*  263*    No results found for this or any previous visit (from the past 240 hour(s)).   Studies: No results found.  Scheduled Meds: . amoxicillin  500 mg Oral TID  . baclofen  20 mg Oral QHS  . bethanechol  50 mg Oral BID  . feeding supplement (PRO-STAT SUGAR FREE 64)  30 mL Oral TID  . fluconazole  100 mg Oral Daily  . FLUoxetine  10 mg Oral Daily  . gabapentin  300 mg Oral BID  . insulin aspart  0-15 Units Subcutaneous TID WC  . insulin aspart  0-5 Units Subcutaneous QHS  . Insulin Degludec  20 Units Subcutaneous Daily  . lactulose  30 g Oral TID  . multivitamin with minerals  1 tablet Oral Daily  . pantoprazole  40 mg Oral Daily  . rifaximin  550 mg Oral BID  . sodium bicarbonate  650 mg Oral BID  . tamsulosin  0.4 mg Oral QPC supper  . [START ON 07/31/2015] Vitamin D (Ergocalciferol)  50,000 Units Oral Q7 days   Continuous Infusions:    Time spent: > 35 minutes  [name] A  [facility] Pager [phone] If 7PM-7AM, please contact night-coverage at [email or web address], password [facility] 07/30/2015, 4:03 PM  LOS: 5 days

## 2015-07-31 ENCOUNTER — Other Ambulatory Visit: Payer: Self-pay

## 2015-07-31 ENCOUNTER — Telehealth: Payer: Self-pay

## 2015-07-31 DIAGNOSIS — N183 Chronic kidney disease, stage 3 (moderate): Secondary | ICD-10-CM

## 2015-07-31 DIAGNOSIS — R188 Other ascites: Secondary | ICD-10-CM

## 2015-07-31 DIAGNOSIS — N179 Acute kidney failure, unspecified: Secondary | ICD-10-CM

## 2015-07-31 LAB — CBC
HEMATOCRIT: 28.2 % — AB (ref 39.0–52.0)
HEMOGLOBIN: 9.6 g/dL — AB (ref 13.0–17.0)
MCH: 31.4 pg (ref 26.0–34.0)
MCHC: 34 g/dL (ref 30.0–36.0)
MCV: 92.2 fL (ref 78.0–100.0)
Platelets: 25 10*3/uL — CL (ref 150–400)
RBC: 3.06 MIL/uL — AB (ref 4.22–5.81)
RDW: 16 % — ABNORMAL HIGH (ref 11.5–15.5)
WBC: 2.8 10*3/uL — AB (ref 4.0–10.5)

## 2015-07-31 LAB — BASIC METABOLIC PANEL
ANION GAP: 5 (ref 5–15)
BUN: 39 mg/dL — ABNORMAL HIGH (ref 6–20)
CALCIUM: 9.4 mg/dL (ref 8.9–10.3)
CHLORIDE: 107 mmol/L (ref 101–111)
CO2: 21 mmol/L — AB (ref 22–32)
Creatinine, Ser: 1.56 mg/dL — ABNORMAL HIGH (ref 0.61–1.24)
GFR calc non Af Amer: 46 mL/min — ABNORMAL LOW (ref >60)
GFR, EST AFRICAN AMERICAN: 53 mL/min — AB (ref >60)
Glucose, Bld: 276 mg/dL — ABNORMAL HIGH (ref 65–99)
Potassium: 5 mmol/L (ref 3.5–5.1)
SODIUM: 133 mmol/L — AB (ref 135–145)

## 2015-07-31 LAB — GLUCOSE, CAPILLARY
GLUCOSE-CAPILLARY: 186 mg/dL — AB (ref 65–99)
GLUCOSE-CAPILLARY: 274 mg/dL — AB (ref 65–99)

## 2015-07-31 LAB — AMMONIA: AMMONIA: 63 umol/L — AB (ref 9–35)

## 2015-07-31 MED ORDER — LACTULOSE 10 GM/15ML PO SOLN: 30.0000 g | Freq: Three times a day (TID) | ORAL | Status: DC

## 2015-07-31 MED ORDER — INSULIN DEGLUDEC 200 UNIT/ML ~~LOC~~ SOPN: 20.0000 [IU] | PEN_INJECTOR | Freq: Every day | SUBCUTANEOUS | Status: DC

## 2015-07-31 MED ORDER — PRO-STAT SUGAR FREE PO LIQD: 30.0000 mL | Freq: Three times a day (TID) | ORAL | Status: DC

## 2015-07-31 NOTE — Progress Notes (Signed)
[facility] Progress Note  Subjective:  Feels well today.  Would like to shower.  Very clear mental status today.  Cr up slightly to 1.56 today.  Objective:  Vital signs in last 24 hours: Temp:  [98 F (36.7 C)-98.3 F (36.8 C)] 98.3 F (36.8 C) (01/24 0617) Pulse Rate:  [67-75] 75 (01/24 0617) Resp:  [18] 18 (01/24 0617) BP: (99-127)/(57-76) 125/76 mmHg (01/24 0617) SpO2:  [100 %] 100 % (01/24 0617) Last BM Date: 07/30/15 General:  Alert, Well-developed, in NAD Heart:  Regular rate and rhythm; no murmurs Pulm:  CTAB.  No W/R/R. Abdomen:  Soft, distended.  BS present.  Non-tender.  Umbilical hernia noted. Extremities:  Without edema. Neurologic:  Alert and oriented x 4;  grossly normal neurologically. Psych:  Alert and cooperative. Normal mood and affect.  Intake/Output from previous day: 01/23 0701 - 01/24 0700 In: 820 [P.O.:720; IV Piggyback:100] Out: 150 [Urine:150]  Lab Results:  Recent Labs  07/29/15 0505 07/30/15 0514 07/31/15 0355  WBC 3.8* 3.2* 2.8*  HGB 10.2* 10.0* 9.6*  HCT 30.0* 28.7* 28.2*  PLT 27* 23* 25*   BMET  Recent Labs  07/29/15 0505 07/30/15 0514 07/31/15 0355  NA 133* 133* 133*  K 4.6 4.4 5.0  CL 108 109 107  CO2 17* 16* 21*  GLUCOSE 209* 177* 276*  BUN 34* 41* 39*  CREATININE 1.53* 1.34* 1.56*  CALCIUM 8.7* 9.1 9.4   LFT  Recent Labs  07/29/15 0505  PROT 5.7*  ALBUMIN 3.2*  AST 22  ALT 17  ALKPHOS 56  BILITOT 1.5*   PT/INR  Recent Labs  07/29/15 1025  LABPROT 19.2*  INR 1.61*   US Paracentesis  07/31/2015  INDICATION: Abdominal distention secondary to recurrent ascites. Request therapeutic paracentesis of up to 2 L max. EXAM: ULTRASOUND GUIDED LEFT LOWER PARACENTESIS MEDICATIONS: None. ANESTHESIA/SEDATION: Moderate Sedation Time:  NONE COMPLICATIONS: None immediate. PROCEDURE: Informed written consent was obtained from the patient after a discussion of the risks, benefits and alternatives to  treatment. A timeout was performed prior to the initiation of the procedure. Initial ultrasound scanning demonstrates a large amount of ascites within the right lower abdominal quadrant. The right lower abdomen was prepped and draped in the usual sterile fashion. 1% lidocaine with epinephrine was used for local anesthesia. Under direct ultrasound guidance, a 19 gauge, 7-cm, Yueh catheter was introduced. An ultrasound image was saved for documentation purposes. The paracentesis was performed. The catheter was removed and a dressing was applied. The patient tolerated the procedure well without immediate post procedural complication. FINDINGS: A total of approximately 2 L of clear yellow fluid was removed. Samples were sent to the laboratory as requested by the clinical team. IMPRESSION: Successful ultrasound-guided paracentesis yielding 2 liters of peritoneal fluid. Read by: Ascencion Dike PA-C Electronically Signed   By: Jerilynn Mages.  Shick M.D.   On: 07/31/2015 08:38    Assessment / Plan: 65 yo male with NASH cirrhosis, portal HTN, ascites, encephalopathy, thrombocytopenia.  Admitted with AMS, likely secondary to HE and AKI secondary to large volume paracentesis. HE improving. Cr up some again today s/p paracentesis.  Paracentesis 1/23 with fluid negative for SBP.  Do not think that we have any plans for restarting diuretics at this point as he does not seem to handle them well. 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 Initial plan is to have him undergo paracentesis every few days with no more than  2 Liters removed each time.  He has a follow-up with [name] in 2 weeks and [name] Media nurse to discuss with him regarding future mgmt of his ascites.    -Patient ok to shower. -Will sign off from a GI standpoint and he is ok for discharge from our standpoint as well.   LOS: 6 days   ZEHR, JESSICA D.  07/31/2015, 10:26 AM  Pager number [phone]     Attending  physician's note   I have taken an interval history, reviewed the chart and examined the patient. I agree with the Advanced Practitioner's note, impression and recommendations. HE has improved and he appears back to his baseline. His AKI was on an improving trend however Cr increased slightly today post paracentesis yesterday. Difficult situation to manage his ascites without a negative renal impact. Initially we will try lower volume paracenteses with albumin every 3 days without diuretics and he will follow up with [name] in 2 weeks for ongoing mgmt. OK for discharge from GI standpoint. GI signing off.   Lucio Edward, MD [facility] [phone] Mon-Fri 8a-5p [phone] after 5p, weekends, holidays

## 2015-07-31 NOTE — Progress Notes (Signed)
Pt discharging home with resumption of HH from [facility], referral called to [facility] at [phone] and faxed to [phone].

## 2015-07-31 NOTE — Progress Notes (Signed)
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 Improved strength and activity tolerance noted. Pt will need continued follow up from PT-recommend HHPT. Wife states she is able to assist as needed in home. Encouraged pt to use RW for ambulation safety. Also spoke with wife about having pt stay on 1st level of home for first few days before attempting full flight of steps.   Follow Up Recommendations  Home health PT;Supervision/Assistance - 24 hour     Equipment Recommendations  None recommended by PT    Recommendations for Other Services       Precautions / Restrictions Precautions Precautions: Fall Restrictions Weight Bearing Restrictions: No    Mobility  Bed Mobility Overal bed mobility: Modified Independent                Transfers Overall transfer level: Needs assistance   Transfers: Sit to/from Stand Sit to Stand: Min assist         General transfer comment: assist to stabilize  Ambulation/Gait Ambulation/Gait assistance: Min assist Ambulation Distance (Feet): 175 Feet Assistive device: Rolling walker (2 wheeled) Gait Pattern/deviations: Trunk flexed;Decreased stride length;Step-through pattern     General Gait Details: Furniture walked short distances, ~15 feet, in room . Walked in hallway with RW. Pt tolerated distance well. Only limited due to emergency bathroom break.    Stairs            Wheelchair Mobility    Modified Rankin (Stroke Patients Only)       Balance Overall balance assessment: Needs  assistance         Standing balance support: During functional activity Standing balance-Leahy Scale: Fair Standing balance comment: LOB x 1 while walking without external support.                    Cognition Arousal/Alertness: Awake/alert Behavior During Therapy: WFL for tasks assessed/performed Overall Cognitive Status: Within Functional Limits for tasks assessed                      Exercises      General Comments        Pertinent Vitals/Pain Pain Assessment: No/denies pain    Home Living Family/patient expects to be discharged to:: Private residence Living Arrangements: Spouse/significant other Available Help at Discharge: Family Type of Home: House Home Access: Stairs to enter Entrance Stairs-Rails: Right;Left;Can reach both Home Layout: Multi-level;Able to live on main level with bedroom/bathroom Home Equipment: Gilford Rile - 2 wheels;Shower seat;Cane - single point;Bedside commode      Prior Function Level of Independence: Needs assistance  Gait / Transfers Assistance Needed: using cane ADL's / Homemaking Assistance Needed: wife assists with dressing PRN. pt usually able to bathe himself.  Comments: using cane in community. furniture walk inside home.    PT Goals (current goals can now be found in the care plan section) Progress towards PT goals: Progressing toward goals    Frequency  Min 3X/week    PT Plan Current plan remains appropriate    Co-evaluation             End of  Session Equipment Utilized During Treatment: Gait belt Activity Tolerance: Patient tolerated treatment well Patient left: in chair;with call bell/phone within reach;with family/visitor present     Time: [id number] PT Time Calculation (min) (ACUTE ONLY): 28 min  Charges:  $Gait Training: 23-37 mins                    G Codes:      Weston Anna, MPT Pager: [phone]

## 2015-07-31 NOTE — Telephone Encounter (Signed)
Dr. [name] PA called and wanted me to schedule this pt for US guided paracentesis to be done every 3 days until his OV with you 08/17/15. Per Nephrology pt cannot have more than 2 liters of fluid drawn off at a time and pt cannot tolerate diuretics. States pt is to go home perhaps today. [name] advise.

## 2015-07-31 NOTE — Discharge Summary (Signed)
Physician Discharge Summary  [name] [id number] DOB: 17-Jun-1951 DOA: 07/25/2015  PCP: [name], MD  Admit date: 07/25/2015 Discharge date: 07/31/2015  Time spent: 35 minutes  Recommendations for Outpatient Follow-up:  Needs B-met to follow K level and renal function Paracentesis every 3 days per GI recommendation, GI to arrange.    Discharge Diagnoses:    Acute hepatic encephalopathy ([place])   ESOPHAGEAL STRICTURE   Bleeding esophageal varices (HCC)   Thrombocytopenia (HCC)   Ascites   Acute renal failure superimposed on stage 3 chronic kidney disease (HCC)   Anemia of chronic disease   Depression   Controlled type 2 diabetes mellitus with diabetic nephropathy, without long-term current use of insulin (HCC)   Diabetic neuropathy (HCC)   Coagulopathy (HCC)   NASH (nonalcoholic steatohepatitis)   Protein-calorie malnutrition, severe   Secondary biliary cirrhosis ([place])   Discharge Condition: Stable  Diet recommendation: renal diet.   Filed Weights   07/28/15 0536 07/29/15 0543 07/30/15 0422  Weight: 81 kg (178 lb 9.2 oz) 80.8 kg (178 lb 2.1 oz) 84.7 kg (186 lb 11.7 oz)    History of present illness:  65 y.o. 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 He is on chronic amoxicillin for this. Pt presented to [facility] ED with worsening mental status changes over past day or so associated with lethargy and confusion, weakness, nausea and vomiting and poor po intake. No fevers. His abdomen was more distended but no reports of abdominal pain. No diarrhea or constipation. No reports of blood in stool or urine. No loss of consciousness.  Per GI note, pt was most recently evaluated by Emilio Math on 07/04/2015 at [facility]. It was felt that  transplantation cannot be considered while he is requiring suppressive antibiotic therapy for a chronic infectious process as infection as an exclusionary contraindication for transplant. He was advised to follow-up at [facility] in 3 months  In ED, BP was 122/59, HR 79, RR 18, Oxygen saturaiton 100%. Blood work was notable for WBC count 3.3, hemoglobin 10.3, paltelets 24, creatinine 2.44, INR 1.73 and lactic acid 3.18, ammonia was 144. He had paracentesis 1/17 with 8 L fluid removed at that time.    Hospital Course:  Acute hepatic encephalopathy ([place]) - GI on board and assisting - on lactulose and rifaximin  - Resolved.   Depression - stable on prozac  AKI; appreciated renal assistance.  - improving and most likely due to dehydration, fluids shift from paracentesis.  -NSL . Renal function improved.  Repeat labs in am after paracentesis cr at 1.5 hold diuretics at discharge. Needs B=met to follow renal function.  Metabolic acidosis improved.    Controlled type 2 diabetes mellitus with diabetic nephropathy,  - Stable on SSI -on lantus.   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 He is on chronic amoxicillin for this.   Diabetic neuropathy (HCC) -on gabapentin.   Coagulopathy (HCC)  NASH (nonalcoholic steatohepatitis)  Thrombocytopenia; stable, secondary to cirrhosis  Had  paracentesis 1-23, no more than 2 L removed with paracentesis.    Chronic Pancytopenia, stable.    Protein-calorie malnutrition, severe - continue MV   ESOPHAGEAL STRICTURE - GI on board  Procedures:  Paracentesis    Consultations: [facility]  Discharge Exam: Filed Vitals:   07/30/15 2121 07/31/15 0617  BP: 99/57 125/76  Pulse: 67 75  Temp: 98 F (36.7 C) 98.3 F (36.8 C)  Resp: 18 18    General: NAD Cardiovascular: S 1, S 2 RRR Respiratory: CTA  Discharge  Instructions   Discharge Instructions    Diet - low sodium heart healthy    Complete by:  As directed      Increase activity slowly    Complete by:  As directed           Current Discharge Medication List    START taking these medications   Details  Amino Acids-Protein Hydrolys (FEEDING SUPPLEMENT, PRO-STAT SUGAR FREE 64,) LIQD Take 30 mLs by mouth 3 (three) times daily. Qty: 900 mL, Refills: 0      CONTINUE these medications which have CHANGED   Details  Insulin Degludec (TRESIBA FLEXTOUCH) 200 UNIT/ML SOPN Inject 20 Units into the skin daily. Qty: 10 mL, Refills: 0    lactulose (CHRONULAC) 10 GM/15ML solution Take 45 mLs (30 g total) by mouth 3 (three) times daily. Qty: 240 mL, Refills: 0      CONTINUE these medications which have NOT CHANGED   Details  amoxicillin (AMOXIL) 500 MG capsule Take 1 capsule (500 mg total) by mouth 3 (three) times daily. Qty: 90 capsule, Refills: 11   Associated Diagnoses: Streptococcal bacteremia    baclofen (LIORESAL) 20 MG tablet Take 1 tablet (20 mg total) by mouth at bedtime. Qty: 30 tablet, Refills: 3   Associated Diagnoses: Left cervical radiculopathy; Cervical myelopathy ([place]); Encephalopathy, hepatic (HCC)    bethanechol (URECHOLINE) 50 MG tablet Take 1 tablet by mouth 2 (two) times daily.     fluconazole (DIFLUCAN) 100 MG tablet Take 1 tablet by mouth daily.    FLUoxetine (PROZAC) 10 MG tablet Take 10 mg by mouth daily.    gabapentin (NEURONTIN) 100 MG capsule Take 300 mg by mouth 3 (three) times daily.     Multiple Vitamin (MULTIVITAMIN WITH MINERALS) TABS tablet Take 1 tablet by mouth daily.    pantoprazole (PROTONIX) 40 MG tablet TAKE 1 TABLET (40 MG TOTAL) BY MOUTH DAILY. Qty: 30 tablet, Refills: 5    tamsulosin (FLOMAX) 0.4 MG CAPS capsule Take 1 capsule by mouth daily. Take once daily by mouth 30 minutes after supper    Vitamin D, Ergocalciferol, (DRISDOL) 50000 UNITS CAPS capsule Take 50,000 Units by mouth every 7  (seven) days. Mondays. Refills: 11    XIFAXAN 550 MG TABS tablet TAKE 1 TABLET (550 MG TOTAL) BY MOUTH 2 (TWO) TIMES DAILY. Qty: 60 tablet, Refills: 1    insulin aspart (NOVOLOG FLEXPEN) 100 UNIT/ML FlexPen CBG 121 - 150: 1 unit    CBG 151 - 200: 2 units    CBG 201 - 250: 3 units    CBG 251 - 300: 5 units    CBG 301 - 350: 7 units    CBG 351 - 400 9 units Qty: 15 mL, Refills: 1      STOP taking these medications     furosemide (LASIX) 20 MG tablet      spironolactone (ALDACTONE) 100 MG tablet        No Known Allergies Follow-up Information    Follow up with [name], MD In 1 week.   Specialty:  Family Medicine   Contact information:   [address] [place] [address] [phone]  The results of significant diagnostics from this hospitalization (including imaging, microbiology, ancillary and laboratory) are listed below for reference.    Significant Diagnostic Studies: Dg Chest 2 View  07/10/2015  CLINICAL DATA:  Generalized weakness and aching pain. EXAM: CHEST - 2 VIEW COMPARISON:  One-view chest x-ray 04/26/2015. Two-view chest x-ray 10/21/2014 FINDINGS: Low lung volumes exaggerate the heart size. Mild bibasilar atelectasis or scarring is again seen. No other significant airspace consolidation is present. There is no edema or effusion to suggest failure. Exaggerated thoracic kyphosis is similar the prior exam. IMPRESSION: No acute cardiopulmonary disease or significant interval change. Electronically Signed   By: San Morelle M.D.   On: 07/10/2015 16:39   Ct Head Wo Contrast  07/10/2015  CLINICAL DATA:  Hx of cirrhosis, need for liver transplant. Here today for generalized weakness, generalized aching pain. Wife states all of this over the last three days. Presents at the ER with lethargy, mild confusion, CBG 64 EXAM: CT HEAD WITHOUT CONTRAST TECHNIQUE: Contiguous axial images were obtained from the base of the skull through  the vertex without intravenous contrast. COMPARISON:  PET-CT 04/2015 FINDINGS: No acute intracranial hemorrhage. No focal mass lesion. No CT evidence of acute infarction. No midline shift or mass effect. No hydrocephalus. Basilar cisterns are patent. Mild generalized cortical atrophy periventricular white matter hypodensities not changed prior. Paranasal sinuses and  mastoid air cells are clear. IMPRESSION: 1. No acute intracranial findings. 2. Stable atrophy and white matter microvascular disease. Electronically Signed   By: Suzy Bouchard M.D.   On: 07/10/2015 16:33   US Renal  07/27/2015  CLINICAL DATA:  Acute renal failure. EXAM: RENAL / URINARY TRACT ULTRASOUND COMPLETE COMPARISON:  Multiple prior ultrasounds for paracentesis localization. FINDINGS: Right Kidney: Length: 11.1 cm. Echogenicity within normal limits. No mass or hydronephrosis visualized. Left Kidney: Length: 11.8 cm. Echogenicity within normal limits. No mass or hydronephrosis visualized. Bladder: Decompressed by Foley catheter. Additional findings:  Splenomegaly.  Ascites. IMPRESSION: No hydronephrosis. Normal renal architecture. No evidence for bladder outlet obstruction. Electronically Signed   By: Staci Righter M.D.   On: 07/27/2015 10:04   US Paracentesis  07/31/2015  INDICATION: Abdominal distention secondary to recurrent ascites. Request therapeutic paracentesis of up to 2 L max. EXAM: ULTRASOUND GUIDED LEFT LOWER PARACENTESIS MEDICATIONS: None. ANESTHESIA/SEDATION: Moderate Sedation Time:  NONE COMPLICATIONS: None immediate. PROCEDURE: Informed written consent was obtained from the patient after a discussion of the risks, benefits and alternatives to treatment. A timeout was performed prior to the initiation of the procedure. Initial ultrasound scanning demonstrates a large amount of ascites within the right lower abdominal quadrant. The right lower abdomen was prepped and draped in the usual sterile fashion. 1% lidocaine with  epinephrine was used for local anesthesia. Under direct ultrasound guidance, a 19 gauge, 7-cm, Yueh catheter was introduced. An ultrasound image was saved for documentation purposes. The paracentesis was performed. The catheter was removed and a dressing was applied. The patient tolerated the procedure well without immediate post procedural complication. FINDINGS: A total of approximately 2 L of clear yellow fluid was removed. Samples were sent to the laboratory as requested by the clinical team. IMPRESSION: Successful ultrasound-guided paracentesis yielding 2 liters of peritoneal fluid. Read by: Ascencion Dike PA-C Electronically Signed   By: Jerilynn Mages.  Shick M.D.   On: 07/31/2015 08:38   US Paracentesis  07/24/2015  INDICATION: Cirrhosis with recurrent large volume ascites. Request diagnostic and therapeutic paracentesis EXAM: ULTRASOUND-GUIDED PARACENTESIS COMPARISON:  None. MEDICATIONS: 1% Lidocaine COMPLICATIONS: None immediate  TECHNIQUE: Informed written consent was obtained from the patient after a discussion of the risks, benefits and alternatives to treatment. A timeout was performed prior to the initiation of the procedure. Initial ultrasound scanning demonstrates a large amount of ascites within the right lower abdominal quadrant. An ultrasound image was saved for documentation purposes. The right lower abdomen was prepped and draped in the usual sterile fashion. 1% lidocaine with epinephrine was used for local anesthesia. Under direct ultrasound guidance, a 19 gauge, 7-cm, Yueh catheter was introduced. The paracentesis was performed. The catheter was removed and a dressing was applied. The patient tolerated the procedure well without immediate post procedural complication. FINDINGS: A total of approximately 8 liters of clear yellow fluid was removed. Samples were sent to the laboratory as requested by the clinical team. IMPRESSION: Successful ultrasound-guided paracentesis yielding 8 liters of peritoneal  fluid. Read by:  Gareth Eagle, PA-C Electronically Signed   By: Marybelle Killings M.D.   On: 07/24/2015 10:07   US Paracentesis  07/06/2015  INDICATION: Cirrhosis with recurrent ascites. Request diagnostic and therapeutic paracentesis. EXAM: ULTRASOUND-GUIDED PARACENTESIS COMPARISON:  Paracentesis 06/26/2015. MEDICATIONS: None. COMPLICATIONS: None immediate TECHNIQUE: Informed written consent was obtained from the patient after a discussion of the risks, benefits and alternatives to treatment. A timeout was performed prior to the initiation of the procedure. Initial ultrasound scanning demonstrates a large amount of ascites within the left lower abdominal quadrant. The left lower abdomen was prepped and draped in the usual sterile fashion. 1% lidocaine was used for local anesthesia. Under direct ultrasound guidance, a 19 gauge, 7-cm, Yueh catheter was introduced. An ultrasound image was saved for documentation purposed. The paracentesis was performed. The catheter was removed and a dressing was applied. The patient tolerated the procedure well without immediate post procedural complication. Post procedure IV Albumin was ordered. FINDINGS: A total of approximately 8 liters of serosanguinous fluid was removed. Samples were sent to the laboratory as requested by the clinical team. IMPRESSION: Successful ultrasound-guided paracentesis yielding 8 liters of peritoneal fluid. Read By:  Tsosie Billing PA-C Electronically Signed   By: Aletta Edouard M.D.   On: 07/06/2015 12:48    Microbiology: No results found for this or any previous visit (from the past 240 hour(s)).   Labs: Basic Metabolic Panel:  Recent Labs Lab 07/25/15 2132  07/27/15 0915 07/28/15 0534 07/29/15 0505 07/30/15 0514 07/31/15 0355  NA 136  < > 133* 135 133* 133* 133*  K 5.1  < > 4.7 5.0 4.6 4.4 5.0  CL 102  < > 104 105 108 109 107  CO2 23  < > 22 23 17* 16* 21*  GLUCOSE 159*  < > 143* 124* 209* 177* 276*  BUN 39*  < > 37* 37* 34* 41*  39*  CREATININE 2.34*  < > 2.01* 1.90* 1.53* 1.34* 1.56*  CALCIUM 10.0  < > 9.1 9.1 8.7* 9.1 9.4  MG 2.2  --   --   --   --   --   --   PHOS 3.6  --   --   --   --   --   --   < > = values in this interval not displayed. Liver Function Tests:  Recent Labs Lab 07/25/15 2132 07/26/15 0440 07/27/15 0915 07/28/15 0534 07/29/15 0505  AST _0 ALT _1 ALKPHOS 60 56 55 55 56  BILITOT 2.0* 2.2* 2.7* 1.5* 1.5*  PROT 6.8 6.6 6.2* 6.1*  5.7*  ALBUMIN 3.9 3.9 3.6 3.4* 3.2*    Recent Labs Lab 07/25/15 1144  LIPASE 55*    Recent Labs Lab 07/26/15 1218 07/27/15 1050 07/28/15 0534 07/29/15 0505 07/31/15 0355  AMMONIA 90* 38* 49* 58* 63*   CBC:  Recent Labs Lab 07/25/15 1144 07/25/15 2132  07/27/15 0915 07/28/15 0534 07/29/15 0505 07/30/15 0514 07/31/15 0355  WBC 3.3* 3.1*  < > 2.8* 3.0* 3.8* 3.2* 2.8*  NEUTROABS 2.6 2.4  --   --   --   --   --   --   HGB 10.3* 10.3*  < > 10.4* 10.1* 10.2* 10.0* 9.6*  HCT 29.4* 29.8*  < > 30.5* 28.4* 30.0* 28.7* 28.2*  MCV 89.6 90.0  < > 91.6 89.3 91.7 89.1 92.2  PLT 24* 25*  < > 24* 24* 27* 23* 25*  < > = values in this interval not displayed. Cardiac Enzymes: No results for input(s): CKTOTAL, CKMB, CKMBINDEX, TROPONINI in the last 168 hours. BNP: BNP (last 3 results)  Recent Labs  07/25/15 1144  BNP 75.7    ProBNP (last 3 results) No results for input(s): PROBNP in the last 8760 hours.  CBG:  Recent Labs Lab 07/30/15 1228 07/30/15 1655 07/30/15 2114 07/31/15 0729 07/31/15 1150  GLUCAP 263* 221* 226* 186* 274*       Signed:  Niel Hummer A MD.  [facility] 07/31/2015, 3:44 PM

## 2015-07-31 NOTE — Progress Notes (Signed)
Nutrition Follow-up  DOCUMENTATION CODES:   Severe malnutrition in context of chronic illness  INTERVENTION:  - Continue Prostat TID - RD will continue to monitor for needs  NUTRITION DIAGNOSIS:   Inadequate oral intake related to poor appetite, chronic illness as evidenced by per patient/family report. -resolved  No new nutrition dx  GOAL:   Patient will meet greater than or equal to 90% of their needs -met at this time  MONITOR:   PO intake, Supplement acceptance, Weight trends, Labs, I & O's  ASSESSMENT:   65 y.o. 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 He is on chronic amoxicillin for this. Pt presented to [facility] ED with worsening mental status changes over past day or so associated with lethargy and confusion, weakness, nausea and vomiting and poor po intake. No fevers. His abdomen was more distended but no reports of abdominal pain. No diarrhea or constipation. No reports of blood in stool or urine. No loss of consciousness.  1/24 Pt had repeat paracentesis 1/23 with 2L (max) removed at that time. See nephrology note related to this max amount. Per chart review, pt has mainly been eating 100% of meals since previous RD assessment. Pt states that he has a good appetite and denies any discomfort when eating. He states that for lunch today he had pot roast, mashed potatoes, and pasta salad. Pt eating Italian ice during RD visit.  Possible d/c today. Pt and wife, who is at bedside, deny having nutrition-related questions or concerns at this time. Pt is followed at [facility] and has had extensive diet education related to cirrhosis in the past.   Meeting needs at this time. Medications reviewed. Labs reviewed; CBGs: 145-283 mg/dL, Na: 133 mmol/L,  BUN/creatinine elevated, GFR: 46.    1/19 - Pt sleeping at time of RD visit and notes indicate that he has been confused since admission.  - Spoke with pt's wife, who is at bedside, who reported all information. - Breakfast tray in the room with ~50% completion of bowl of Cheerios and 2 cups of orange juice consumed.  - Wife states that pt has had a very poor appetite x1 week due to not feeling well.  - She states that in April 2016 he had an infection and was at [facility] for 3 weeks.  - Since that time he has not had much of an appetite although it did slightly increase over the summer.  - Wife states that pt is being followed at [facility] for liver transplant.  - Pt and wife have been extensively educated on high protein, low sodium diet.  - Wife reports that she does not use salt when cooking or add it after cooking. -  Pt's biggest source of sodium intake is from [employer] canned soups.  - Pt is unable to finish an entire can and will only consume a half can when he eats this item. - Unable to perform physical assessment at this time.  - Wife reports that pt weighed 235 lbs prior to hospitalization in April 2016.  - This indicates, using CBW, 95 lb weight loss (40% body weight) in <1 year which is significant for time frame.  - Pt with hx of cirrhosis and documentation indicates he has esophageal varices.  - Wife denies pt having chewing or swallowing difficulty  or complaining of pain with swallowing.  - Per chart review, pt had paracentesis 07/06/15 which yielded 8L and repeat paracentesis 07/24/15 also yielded 8L.  Diet Order:  Diet Heart Room service appropriate?: Yes; Fluid consistency:: Thin  Skin:  Reviewed, no issues  Last BM:  1/23  Height:   Ht Readings from Last 1 Encounters:  07/25/15 _0  (1.778 m)    Weight:   Wt Readings from Last 1 Encounters:  07/30/15 186 lb 11.7 oz (84.7 kg)    Ideal Body Weight:  75.45 kg (kg)  BMI:  Body mass index is 26.79  kg/(m^2).  Estimated Nutritional Needs:   Kcal:  1900-2100  Protein:  85-95 grams  Fluid:  1.5 L/day  EDUCATION NEEDS:   Education needs addressed     Jarome Matin, RD, LDN Inpatient Clinical Dietitian Pager # [phone] After hours/weekend pager # [phone]

## 2015-07-31 NOTE — Telephone Encounter (Signed)
If that's what the expert recommendation is, then okay. Needs albumin replacement with each paracentesis.

## 2015-07-31 NOTE — Telephone Encounter (Signed)
Orders entered in epic for US paracentesis every 72 hours, draw off no more than 2 liters. Pt to receive albumin 16gm IV. Fluid to be sent for cell count and diff.

## 2015-08-01 ENCOUNTER — Other Ambulatory Visit: Payer: Self-pay

## 2015-08-01 DIAGNOSIS — R188 Other ascites: Secondary | ICD-10-CM

## 2015-08-01 NOTE — Telephone Encounter (Signed)
Pt scheduled for [place] para no more than 2 liters to be drawn off, pt to receive Albumin 8gm IV for each liter drawn off. Fluid to be sent for cell count and diff. See below for schedule:  1/31 at [facility] 10am 2/3 at [facility] 10am 2/6 at [facility] 10am 2/10 at [facility] 10am Pt to arrive at hospitals at 9:45am  Pt to have BMET at office 2/1.  Spoke with pts wife and she is aware of appts.

## 2015-08-03 ENCOUNTER — Telehealth: Payer: Self-pay | Admitting: Internal Medicine

## 2015-08-03 NOTE — Telephone Encounter (Signed)
Discussed with pts wife that the Kidney specialists that consulted on pt in the hospital stated that the pt could not tolerate diuretics and he can only have 2 liters of fluid drawn off due to kidneys. Wife wanted to know if pts appt could be moved up with [name]. Pt rescheduled to see [name] 08/09/15@8 :30am. Wife aware.

## 2015-08-04 ENCOUNTER — Observation Stay (HOSPITAL_COMMUNITY)
Admission: EM | Admit: 2015-08-04 | Discharge: 2015-08-05 | Disposition: A | Discharge status: Home / Self Care | Payer: Managed Care, Other (non HMO) | Attending: Internal Medicine | Admitting: Internal Medicine

## 2015-08-04 ENCOUNTER — Encounter (HOSPITAL_COMMUNITY): Payer: Self-pay

## 2015-08-04 ENCOUNTER — Emergency Department (HOSPITAL_COMMUNITY): Payer: Managed Care, Other (non HMO)

## 2015-08-04 DIAGNOSIS — D638 Anemia in other chronic diseases classified elsewhere: Secondary | ICD-10-CM | POA: Insufficient documentation

## 2015-08-04 DIAGNOSIS — R140 Abdominal distension (gaseous): Secondary | ICD-10-CM | POA: Insufficient documentation

## 2015-08-04 DIAGNOSIS — R079 Chest pain, unspecified: Secondary | ICD-10-CM | POA: Insufficient documentation

## 2015-08-04 DIAGNOSIS — R188 Other ascites: Secondary | ICD-10-CM | POA: Diagnosis not present

## 2015-08-04 DIAGNOSIS — R1010 Upper abdominal pain, unspecified: Secondary | ICD-10-CM | POA: Diagnosis present

## 2015-08-04 DIAGNOSIS — K7200 Acute and subacute hepatic failure without coma: Secondary | ICD-10-CM | POA: Diagnosis not present

## 2015-08-04 DIAGNOSIS — N183 Chronic kidney disease, stage 3 (moderate): Secondary | ICD-10-CM | POA: Insufficient documentation

## 2015-08-04 DIAGNOSIS — R0602 Shortness of breath: Secondary | ICD-10-CM | POA: Diagnosis not present

## 2015-08-04 DIAGNOSIS — Z794 Long term (current) use of insulin: Secondary | ICD-10-CM | POA: Insufficient documentation

## 2015-08-04 DIAGNOSIS — Z79899 Other long term (current) drug therapy: Secondary | ICD-10-CM | POA: Insufficient documentation

## 2015-08-04 DIAGNOSIS — Z7951 Long term (current) use of inhaled steroids: Secondary | ICD-10-CM | POA: Diagnosis not present

## 2015-08-04 DIAGNOSIS — K222 Esophageal obstruction: Secondary | ICD-10-CM | POA: Diagnosis not present

## 2015-08-04 DIAGNOSIS — E1140 Type 2 diabetes mellitus with diabetic neuropathy, unspecified: Secondary | ICD-10-CM | POA: Insufficient documentation

## 2015-08-04 DIAGNOSIS — Z792 Long term (current) use of antibiotics: Secondary | ICD-10-CM | POA: Insufficient documentation

## 2015-08-04 DIAGNOSIS — I8501 Esophageal varices with bleeding: Secondary | ICD-10-CM | POA: Insufficient documentation

## 2015-08-04 DIAGNOSIS — D696 Thrombocytopenia, unspecified: Secondary | ICD-10-CM | POA: Diagnosis not present

## 2015-08-04 DIAGNOSIS — D689 Coagulation defect, unspecified: Secondary | ICD-10-CM | POA: Diagnosis not present

## 2015-08-04 DIAGNOSIS — E43 Unspecified severe protein-calorie malnutrition: Secondary | ICD-10-CM | POA: Diagnosis not present

## 2015-08-04 HISTORY — DX: Anemia in other chronic diseases classified elsewhere: D63.8

## 2015-08-04 HISTORY — DX: Major depressive disorder, single episode, unspecified: F32.9

## 2015-08-04 HISTORY — DX: Esophageal obstruction: K22.2

## 2015-08-04 HISTORY — DX: Type 2 diabetes mellitus with diabetic neuropathy, unspecified: E11.40

## 2015-08-04 HISTORY — DX: Depression, unspecified: F32.A

## 2015-08-04 HISTORY — DX: Acute and subacute hepatic failure without coma: K72.00

## 2015-08-04 HISTORY — DX: Chronic kidney disease, stage 3 (moderate): N18.3

## 2015-08-04 HISTORY — DX: Acute kidney failure, unspecified: N17.9

## 2015-08-04 HISTORY — DX: Chronic kidney disease, stage 3 unspecified: N18.30

## 2015-08-04 HISTORY — DX: Coagulation defect, unspecified: D68.9

## 2015-08-04 HISTORY — DX: Type 2 diabetes mellitus without complications: E11.9

## 2015-08-04 HISTORY — DX: Unspecified severe protein-calorie malnutrition: E43

## 2015-08-04 HISTORY — DX: Secondary biliary cirrhosis: K74.4

## 2015-08-04 HISTORY — DX: Esophageal varices with bleeding: I85.01

## 2015-08-04 HISTORY — DX: Thrombocytopenia, unspecified: D69.6

## 2015-08-04 HISTORY — DX: Nonalcoholic steatohepatitis (NASH): K75.81

## 2015-08-04 HISTORY — DX: Hepatic encephalopathy: K76.82

## 2015-08-04 LAB — LIPASE, BLOOD: Lipase: 62 U/L — ABNORMAL HIGH (ref 11–51)

## 2015-08-04 LAB — COMPREHENSIVE METABOLIC PANEL
ALK PHOS: 77 U/L (ref 38–126)
ALT: 26 U/L (ref 17–63)
ANION GAP: 7 (ref 5–15)
AST: 34 U/L (ref 15–41)
Albumin: 3.3 g/dL — ABNORMAL LOW (ref 3.5–5.0)
BUN: 49 mg/dL — ABNORMAL HIGH (ref 6–20)
CALCIUM: 9.2 mg/dL (ref 8.9–10.3)
CO2: 16 mmol/L — ABNORMAL LOW (ref 22–32)
CREATININE: 1.87 mg/dL — AB (ref 0.61–1.24)
Chloride: 107 mmol/L (ref 101–111)
GFR, EST AFRICAN AMERICAN: 43 mL/min — AB (ref >60)
GFR, EST NON AFRICAN AMERICAN: 37 mL/min — AB (ref >60)
Glucose, Bld: 211 mg/dL — ABNORMAL HIGH (ref 65–99)
Potassium: 4.9 mmol/L (ref 3.5–5.1)
SODIUM: 130 mmol/L — AB (ref 135–145)
TOTAL PROTEIN: 6 g/dL — AB (ref 6.5–8.1)
Total Bilirubin: 1.3 mg/dL — ABNORMAL HIGH (ref 0.3–1.2)

## 2015-08-04 LAB — URINALYSIS, ROUTINE W REFLEX MICROSCOPIC
Bilirubin Urine: NEGATIVE
Glucose, UA: NEGATIVE mg/dL
HGB URINE DIPSTICK: NEGATIVE
Ketones, ur: NEGATIVE mg/dL
LEUKOCYTES UA: NEGATIVE
NITRITE: NEGATIVE
PROTEIN: NEGATIVE mg/dL
SPECIFIC GRAVITY, URINE: 1.023 (ref 1.005–1.030)
pH: 6 (ref 5.0–8.0)

## 2015-08-04 LAB — CBC
HCT: 28.7 % — ABNORMAL LOW (ref 39.0–52.0)
HEMOGLOBIN: 9.9 g/dL — AB (ref 13.0–17.0)
MCH: 31.3 pg (ref 26.0–34.0)
MCHC: 34.5 g/dL (ref 30.0–36.0)
MCV: 90.8 fL (ref 78.0–100.0)
PLATELETS: 26 10*3/uL — AB (ref 150–400)
RBC: 3.16 MIL/uL — AB (ref 4.22–5.81)
RDW: 15.8 % — ABNORMAL HIGH (ref 11.5–15.5)
WBC: 3.3 10*3/uL — AB (ref 4.0–10.5)

## 2015-08-04 LAB — I-STAT TROPONIN, ED: TROPONIN I, POC: 0 ng/mL (ref 0.00–0.08)

## 2015-08-04 MED ORDER — ONDANSETRON HCL 4 MG/2ML IJ SOLN: 4.0000 mg | Freq: Once | INTRAMUSCULAR | Status: DC | PRN

## 2015-08-04 NOTE — ED Notes (Addendum)
Pt c/o of progressive abd pain, chest pain and sob since this AM. Pt has hx of Ascites. Pt presents to ED with sizeable abdominal girth. Pt states he was SOB earlier and that has resolved since arriving here.  Pt A+OX4, speaking in complete sentences. Pt stated he vomited a mimimal amount upon EMS arrival to his home.

## 2015-08-04 NOTE — ED Notes (Signed)
Patient transported to X-ray 

## 2015-08-04 NOTE — ED Provider Notes (Signed)
CSN: [id number]     Arrival date & time 08/04/15  2013 History   First MD Initiated Contact with Patient 08/04/15 2021     Chief Complaint  Patient presents with  . Abdominal Pain  . Shortness of Breath  . Chest Pain   HPI  Mr. Magstadt is a 65 year old male with PMHx of NASH, cirrhosis, acute hepatic encephalopathy, esophageal varices, DM and CKD presenting with abdominal pain. He states he has had chronic abdominal pain since he began developing ascites approximately 1 year ago. Earlier today he had mild discomfort in his upper abdomen. He states this is the pain he gets "when it's ready to get some fluid taken off". He notes associated SOB on exertion when EMS arrived. He states he is not SOB at rest. He also notes some mild chest pain that he states "is the fluid pushing on my diaphragm". He has a scheduled paracentesis date of 1/31 but he feels that he couldn't make it to this date with the amount of discomfort he is having. He reports compliance with his lactuolose and has 3-4 BMs per day. Denies fevers, chills, AMS, dizziness, syncope, chest pain, cough, nausea or vomiting. He has no other complaints today.   Past Medical History  Diagnosis Date  . Ascites   . Acute hepatic encephalopathy ([place])   . Acute renal failure superimposed on stage 3 chronic kidney disease ([place])   . Anemia of chronic disease   . Bleeding esophageal varices ([place])   . Coagulopathy ([place])   . Diabetes mellitus type II, controlled ([place])   . Depression   . Diabetic neuropathy ([place])   . Esophageal stricture   . NASH (nonalcoholic steatohepatitis)   . Protein-calorie malnutrition, severe ([place])   . Secondary biliary cirrhosis ([place])   . Thrombocytopenia [facility])    Past Surgical History  Procedure Laterality Date  . Kidney stone surgery  yrs ago  . Esophagogastroduodenoscopy  08/01/2011    Procedure: ESOPHAGOGASTRODUODENOSCOPY (EGD);  Surgeon: Scarlette Shorts, MD;  Location: [facility];  Service: Endoscopy;  Laterality:  N/A;  . Esophagogastroduodenoscopy N/A 10/27/2012    Procedure: ESOPHAGOGASTRODUODENOSCOPY (EGD);  Surgeon: Inda Castle, MD;  Location: [facility];  Service: Endoscopy;  Laterality: N/A;  . Esophagogastroduodenoscopy N/A 11/22/2012    Procedure: ESOPHAGOGASTRODUODENOSCOPY (EGD);  Surgeon: Irene Shipper, MD;  Location: [facility];  Service: Endoscopy;  Laterality: N/A;  . Colonoscopy    . Upper gastrointestinal endoscopy    . Esophagogastroduodenoscopy N/A 11/29/2013    Procedure: ESOPHAGOGASTRODUODENOSCOPY (EGD);  Surgeon: Irene Shipper, MD;  Location: [facility];  Service: Endoscopy;  Laterality: N/A;  . Esophageal banding Bilateral 11/29/2013    Procedure: ESOPHAGEAL BANDING;  Surgeon: Irene Shipper, MD;  Location: [facility] ENDOSCOPY;  Service: Endoscopy;  Laterality: Bilateral;  . Esophagogastroduodenoscopy N/A 06/13/2014    Procedure: ESOPHAGOGASTRODUODENOSCOPY (EGD);  Surgeon: Irene Shipper, MD;  Location: [facility];  Service: Endoscopy;  Laterality: N/A;  . Esophageal banding N/A 06/13/2014    Procedure: ESOPHAGEAL BANDING;  Surgeon: Irene Shipper, MD;  Location: [facility] ENDOSCOPY;  Service: Endoscopy;  Laterality: N/A;   Family History  Problem Relation Age of Onset  . Colon cancer Neg Hx   . Anesthesia problems Neg Hx   . Hypotension Neg Hx   . Malignant hyperthermia Neg Hx   . Pseudochol deficiency Neg Hx   . Diabetes Father   . Heart disease Father   . Aneurysm Mother   . Heart disease Other  neice  . Diabetes      niece   Social History  Substance Use Topics  . Smoking status: Never Smoker   . Smokeless tobacco: Never Used  . Alcohol Use: No     Comment: very rare    Review of Systems  Respiratory: Positive for shortness of breath.   Gastrointestinal: Positive for abdominal pain and abdominal distention.  All other systems reviewed and are negative.     Allergies  Review of patient's allergies indicates no known allergies.  Home Medications   Prior to  Admission medications   Medication Sig Start Date End Date Taking? Authorizing Provider  amoxicillin (AMOXIL) 500 MG capsule Take 1 capsule (500 mg total) by mouth 3 (three) times daily. 01/16/15  Yes Truman Hayward, MD  baclofen (LIORESAL) 20 MG tablet Take 1 tablet (20 mg total) by mouth at bedtime. 06/11/15  Yes Meredith Staggers, MD  bethanechol (URECHOLINE) 50 MG tablet Take 1 tablet by mouth 2 (two) times daily.  12/19/14  Yes Historical Provider, MD  fluconazole (DIFLUCAN) 100 MG tablet Take 1 tablet by mouth daily. 07/19/15  Yes Historical Provider, MD  FLUoxetine (PROZAC) 10 MG tablet Take 10 mg by mouth daily.   Yes Historical Provider, MD  fluticasone (FLONASE) 50 MCG/ACT nasal spray Place 1 spray into both nostrils daily. 08/02/15  Yes Historical Provider, MD  gabapentin (NEURONTIN) 100 MG capsule Take 100 mg by mouth 3 (three) times daily.    Yes Historical Provider, MD  Insulin Degludec (TRESIBA FLEXTOUCH) 200 UNIT/ML SOPN Inject 20 Units into the skin daily. 07/31/15  Yes Belkys A Regalado, MD  lactulose (CHRONULAC) 10 GM/15ML solution Take 45 mLs (30 g total) by mouth 3 (three) times daily. 07/31/15  Yes Belkys A Regalado, MD  Multiple Vitamin (MULTIVITAMIN WITH MINERALS) TABS tablet Take 1 tablet by mouth daily.   Yes Historical Provider, MD  pantoprazole (PROTONIX) 40 MG tablet TAKE 1 TABLET (40 MG TOTAL) BY MOUTH DAILY. 11/13/14  Yes Irene Shipper, MD  tamsulosin (FLOMAX) 0.4 MG CAPS capsule Take 1 capsule by mouth 2 (two) times daily.  12/19/14  Yes Historical Provider, MD  Vitamin D, Ergocalciferol, (DRISDOL) 50000 UNITS CAPS capsule Take 50,000 Units by mouth every 7 (seven) days. Mondays. 03/29/15  Yes Historical Provider, MD  XIFAXAN 550 MG TABS tablet TAKE 1 TABLET (550 MG TOTAL) BY MOUTH 2 (TWO) TIMES DAILY. 06/27/15  Yes Irene Shipper, MD  Amino Acids-Protein Hydrolys (FEEDING SUPPLEMENT, PRO-STAT SUGAR FREE 64,) LIQD Take 30 mLs by mouth 3 (three) times daily. 07/31/15   Belkys A  Regalado, MD  insulin aspart (NOVOLOG FLEXPEN) 100 UNIT/ML FlexPen CBG 121 - 150: 1 unit    CBG 151 - 200: 2 units    CBG 201 - 250: 3 units    CBG 251 - 300: 5 units    CBG 301 - 350: 7 units    CBG 351 - 400 9 units Patient not taking: Reported on 07/25/2015 07/13/15   Robbie Lis, MD   BP 125/79 mmHg  Pulse 83  Temp(Src) 98.6 F (37 C) (Oral)  Resp 17  Wt 88.622 kg  SpO2 99% Physical Exam  Constitutional: He is oriented to person, place, and time. He appears well-developed and well-nourished. No distress.  HENT:  Head: Normocephalic and atraumatic.  Mouth/Throat: Oropharynx is clear and moist.  Eyes: Conjunctivae are normal. Right eye exhibits no discharge. Left eye exhibits no discharge. No scleral icterus.  Neck: Normal range  of motion. Neck supple.  Cardiovascular: Normal rate, regular rhythm and normal heart sounds.   Pulmonary/Chest: Effort normal and breath sounds normal. No respiratory distress.  Slightly diminished breath sounds bilateral lung bases  Abdominal: Soft. He exhibits distension.  Largely distended abdomen with fluid wave. Abdomen is non-tender to palpation.   Musculoskeletal: Normal range of motion.  Neurological: He is alert and oriented to person, place, and time. Coordination normal.  AOx4.   Skin: Skin is warm and dry.  Psychiatric: He has a normal mood and affect. His behavior is normal.  Nursing note and vitals reviewed.   ED Course  Procedures (including critical care time) Labs Review Labs Reviewed  LIPASE, BLOOD - Abnormal; Notable for the following:    Lipase 62 (*)    All other components within normal limits  COMPREHENSIVE METABOLIC PANEL - Abnormal; Notable for the following:    Sodium 130 (*)    CO2 16 (*)    Glucose, Bld 211 (*)    BUN 49 (*)    Creatinine, Ser 1.87 (*)    Total Protein 6.0 (*)    Albumin 3.3 (*)    Total Bilirubin 1.3 (*)    GFR calc non Af Amer 37 (*)    GFR calc Af Amer 43 (*)    All other  components within normal limits  CBC - Abnormal; Notable for the following:    WBC 3.3 (*)    RBC 3.16 (*)    Hemoglobin 9.9 (*)    HCT 28.7 (*)    RDW 15.8 (*)    Platelets 26 (*)    All other components within normal limits  URINALYSIS, ROUTINE W REFLEX MICROSCOPIC (NOT AT [facility])  Randolm Idol, ED    Imaging Review Dg Chest 2 View  08/04/2015  CLINICAL DATA:  Progressive abdominal and chest pain since this morning. Shortness of breath. Ascites and abdominal distention. EXAM: CHEST  2 VIEW COMPARISON:  07/10/2015 FINDINGS: Very low lung volumes are again seen with bibasilar atelectasis. No evidence of pulmonary consolidation or pleural effusion. No pneumothorax visualized. Heart size is within normal limits. IMPRESSION: Very low lung volumes with bibasilar atelectasis. Electronically Signed   By: Earle Gell M.D.   On: 08/04/2015 21:00   I have personally reviewed and evaluated these images and lab results as part of my medical decision-making.   EKG Interpretation None      MDM   Final diagnoses:  Ascites   65 year old male presenting with gradually worsening ascites. He reports his abdomen is pressing on his diaphragm and causing SOB and chest discomfort. He has a scheduled paracentesis on 1/31 but states he cannot make it until then. VSS. Pt is nontoxic appearing. Lungs CTAB with slightly diminished breath sounds at the bases. Abdomen largely distended and soft with fluid wave. Blood work is at his baseline. CXR shows low lung volumes. Discussed patient with ultrasound who have him scheduled for 9 AM paracentesis. Consulted Dr. Alcario Drought for observation admission due to worsening SOB and ascites. Pt will be observed until his paracentesis tomorrow morning.     Lahoma Crocker Andoni Busch, PA-C 08/05/15 0020  Lajean Saver, MD 08/06/15 [phone]

## 2015-08-05 ENCOUNTER — Observation Stay (HOSPITAL_COMMUNITY): Payer: Managed Care, Other (non HMO)

## 2015-08-05 DIAGNOSIS — E119 Type 2 diabetes mellitus without complications: Secondary | ICD-10-CM

## 2015-08-05 DIAGNOSIS — D696 Thrombocytopenia, unspecified: Secondary | ICD-10-CM

## 2015-08-05 DIAGNOSIS — R188 Other ascites: Secondary | ICD-10-CM

## 2015-08-05 DIAGNOSIS — K7460 Unspecified cirrhosis of liver: Secondary | ICD-10-CM

## 2015-08-05 DIAGNOSIS — K7581 Nonalcoholic steatohepatitis (NASH): Secondary | ICD-10-CM | POA: Diagnosis not present

## 2015-08-05 DIAGNOSIS — K7290 Hepatic failure, unspecified without coma: Secondary | ICD-10-CM

## 2015-08-05 DIAGNOSIS — Z794 Long term (current) use of insulin: Secondary | ICD-10-CM

## 2015-08-05 LAB — GLUCOSE, CAPILLARY
Glucose-Capillary: 125 mg/dL — ABNORMAL HIGH (ref 65–99)
Glucose-Capillary: 132 mg/dL — ABNORMAL HIGH (ref 65–99)

## 2015-08-05 MED ORDER — AMOXICILLIN 250 MG PO CAPS
500.0000 mg | ORAL_CAPSULE | Freq: Three times a day (TID) | ORAL | Status: DC
  Administered 2015-08-05: 500 mg via ORAL
  Filled 2015-08-05: qty 2

## 2015-08-05 MED ORDER — GABAPENTIN 100 MG PO CAPS
100.0000 mg | ORAL_CAPSULE | Freq: Three times a day (TID) | ORAL | Status: DC
  Administered 2015-08-05: 100 mg via ORAL
  Filled 2015-08-05: qty 1

## 2015-08-05 MED ORDER — ALBUMIN HUMAN 25 % IV SOLN
12.5000 g | Freq: Once | INTRAVENOUS | Status: AC
  Administered 2015-08-05: 12.5 g via INTRAVENOUS
  Filled 2015-08-05: qty 50

## 2015-08-05 MED ORDER — ONDANSETRON HCL 4 MG/2ML IJ SOLN: 4.0000 mg | Freq: Three times a day (TID) | INTRAMUSCULAR | Status: AC | PRN

## 2015-08-05 MED ORDER — PANTOPRAZOLE SODIUM 40 MG PO TBEC
40.0000 mg | DELAYED_RELEASE_TABLET | Freq: Every day | ORAL | Status: DC
  Administered 2015-08-05: 40 mg via ORAL
  Filled 2015-08-05: qty 1

## 2015-08-05 MED ORDER — INSULIN ASPART 100 UNIT/ML ~~LOC~~ SOLN: 0.0000 [IU] | Freq: Three times a day (TID) | SUBCUTANEOUS | Status: DC

## 2015-08-05 MED ORDER — FLUOXETINE HCL 10 MG PO CAPS
10.0000 mg | ORAL_CAPSULE | Freq: Every day | ORAL | Status: DC
  Administered 2015-08-05: 10 mg via ORAL
  Filled 2015-08-05: qty 1

## 2015-08-05 MED ORDER — RIFAXIMIN 550 MG PO TABS
550.0000 mg | ORAL_TABLET | Freq: Two times a day (BID) | ORAL | Status: DC
  Administered 2015-08-05: 550 mg via ORAL
  Filled 2015-08-05 (×2): qty 1

## 2015-08-05 MED ORDER — BACLOFEN 10 MG PO TABS
20.0000 mg | ORAL_TABLET | Freq: Every day | ORAL | Status: DC
  Administered 2015-08-05: 20 mg via ORAL
  Filled 2015-08-05: qty 1

## 2015-08-05 MED ORDER — FLUCONAZOLE 100 MG PO TABS
100.0000 mg | ORAL_TABLET | Freq: Every day | ORAL | Status: DC
  Administered 2015-08-05: 100 mg via ORAL
  Filled 2015-08-05: qty 1

## 2015-08-05 MED ORDER — FLUTICASONE PROPIONATE 50 MCG/ACT NA SUSP
1.0000 | Freq: Every day | NASAL | Status: DC
  Filled 2015-08-05: qty 16

## 2015-08-05 MED ORDER — LIP MEDEX EX OINT
TOPICAL_OINTMENT | CUTANEOUS | Status: DC | PRN
  Filled 2015-08-05: qty 7

## 2015-08-05 MED ORDER — TAMSULOSIN HCL 0.4 MG PO CAPS
0.4000 mg | ORAL_CAPSULE | Freq: Two times a day (BID) | ORAL | Status: DC
  Administered 2015-08-05 (×2): 0.4 mg via ORAL
  Filled 2015-08-05 (×2): qty 1

## 2015-08-05 MED ORDER — PRO-STAT SUGAR FREE PO LIQD
30.0000 mL | Freq: Three times a day (TID) | ORAL | Status: DC
  Administered 2015-08-05: 30 mL via ORAL
  Filled 2015-08-05: qty 30

## 2015-08-05 MED ORDER — LACTULOSE 10 GM/15ML PO SOLN
30.0000 g | Freq: Three times a day (TID) | ORAL | Status: DC
  Filled 2015-08-05: qty 45

## 2015-08-05 MED ORDER — BETHANECHOL CHLORIDE 25 MG PO TABS
50.0000 mg | ORAL_TABLET | Freq: Two times a day (BID) | ORAL | Status: DC
Start: 2015-08-05 — End: 2015-08-05
  Administered 2015-08-05 (×2): 50 mg via ORAL
  Filled 2015-08-05 (×3): qty 2

## 2015-08-05 MED ORDER — INSULIN DEGLUDEC 200 UNIT/ML ~~LOC~~ SOPN
20.0000 [IU] | PEN_INJECTOR | Freq: Every day | SUBCUTANEOUS | Status: DC
  Administered 2015-08-05: 20 [IU] via SUBCUTANEOUS

## 2015-08-05 MED ORDER — LACTULOSE ENEMA
300.0000 mL | Freq: Once | ORAL | Status: AC
  Administered 2015-08-05: 300 mL via RECTAL
  Filled 2015-08-05: qty 300

## 2015-08-05 MED ORDER — FLUOXETINE HCL 20 MG PO TABS
10.0000 mg | ORAL_TABLET | Freq: Every day | ORAL | Status: DC
  Filled 2015-08-05: qty 1

## 2015-08-05 MED ORDER — ADULT MULTIVITAMIN W/MINERALS CH
1.0000 | ORAL_TABLET | Freq: Every day | ORAL | Status: DC
  Administered 2015-08-05: 1 via ORAL
  Filled 2015-08-05: qty 1

## 2015-08-05 NOTE — Progress Notes (Signed)
Discharge instructions reviewed with patient and wife, questions answered, verbalized understanding.  No prescriptions given.  Patient transported to front of hospital via wheelchair accompanied by wife and Nurse tech for transport home.

## 2015-08-05 NOTE — Discharge Summary (Signed)
Discharge Summary  [name] [id number] DOB: 10/05/1950  PCP: Jonathan Reichert, MD  Admit date: 08/04/2015 Discharge date: 08/05/2015  Time spent: >50mins  Recommendations for Outpatient Follow-up:  1. F/u with gastroenterologist Dr. Scarlette Burch on 2/2 2. Patient is to continue scheduled outpatient paracentesis  Discharge Diagnoses:  Active Hospital Problems   Diagnosis Date Noted  . Ascites 07/25/2015    Resolved Hospital Problems   Diagnosis Date Noted Date Resolved  No resolved problems to display.    Discharge Condition: stable  Diet recommendation: heart healthy/carb modified  Filed Weights   08/04/15 2320 08/05/15 0118  Weight: 88.622 kg (195 lb 6 oz) 88.542 kg (195 lb 3.2 oz)    History of present illness:  [name] is a 65 y.o. 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  Patient presents to ED with c/o worsening ascites, he has appointment on the 31st for drainage but says he cant wait. IR has drainage scheduled for 9AM tomorrow. Hospitalist asked to admit to obs for ascities drainage.  Hospital Course:  Principal Problem:   Ascites  NASH, cirrhosis, chronic thrombocytopenia, hepatic encephalopathy, ascites (has been  off diuretics due to worsening of renal function), now he gets frequent paracentesis.  He had 2liter removed on Sunday 1/29 , he is to continue outpatient paracentesis, which is scheduled on 1/31. he might need two times a week, due to not able to tolerate large volume paracentesis.patient received 12.5g albumin after paracentesis on 1/29.  Patient missed lactulose dose on 1/28 night and 1/29 am and noon, he was initially drowsy, he received lactulose enema on 1/29 after paracentesis, he is to continue home dose lactulose. Now he is alert and oriented x3 with good short term memory.  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   Procedures:  Ultrasound guided paracentesis on 1/29  Consultations:  none  Discharge Exam: BP 107/57 mmHg  Pulse 77  Temp(Src) 97.8 F (36.6 C) (Oral)  Resp 16  Ht 5\' 10"  (1.778 m)  Wt 88.542 kg (195 lb 3.2 oz)  BMI 28.01 kg/m2  SpO2 100%  General: aaox3, NAD Cardiovascular: RRR Respiratory: CTABL Ab: distended, no tender, +bowel sounds Extremity: no edema  Discharge Instructions You were cared for by a hospitalist during your hospital stay. If you have any questions about your discharge medications or the care you received while you were in the hospital after you are discharged, you can call the unit and asked to speak with the hospitalist on call if the hospitalist that took care of you is not available. Once you are discharged, your primary care physician will handle any further medical issues. Please note that NO REFILLS for any discharge medications will be authorized once you are discharged, as it is imperative that you return to your primary care physician (or establish a relationship with a primary care physician if you do not have one) for your aftercare needs so that they can reassess your need for medications and monitor your lab values.      Discharge Instructions    Diet - low sodium heart healthy    Complete by:  As directed      Increase activity slowly    Complete by:  As directed             Medication List    TAKE these medications        amoxicillin 500 MG capsule  Commonly known as:  AMOXIL  Take 1 capsule (500 mg total) by mouth 3 (three) times daily.     baclofen 20 MG tablet  Commonly known as:  LIORESAL  Take 1 tablet (20 mg total) by mouth at bedtime.     bethanechol 50 MG tablet  Commonly known as:   URECHOLINE  Take 1 tablet by mouth 2 (two) times daily.     feeding supplement (PRO-STAT SUGAR FREE 64) Liqd  Take 30 mLs by mouth 3 (three) times daily.     fluconazole 100 MG tablet  Commonly known as:  DIFLUCAN  Take 1 tablet by mouth daily.     FLUoxetine 10 MG tablet  Commonly known as:  PROZAC  Take 10 mg by mouth daily.     fluticasone 50 MCG/ACT nasal spray  Commonly known as:  FLONASE  Place 1 spray into both nostrils daily.     gabapentin 100 MG capsule  Commonly known as:  NEURONTIN  Take 100 mg by mouth 3 (three) times daily.     Insulin Degludec 200 UNIT/ML Sopn  Commonly known as:  TRESIBA FLEXTOUCH  Inject 20 Units into the skin daily.     lactulose 10 GM/15ML solution  Commonly known as:  CHRONULAC  Take 45 mLs (30 g total) by mouth 3 (three) times daily.     multivitamin with minerals Tabs tablet  Take 1 tablet by mouth daily.     pantoprazole 40 MG tablet  Commonly known as:  PROTONIX  TAKE 1 TABLET (40 MG TOTAL) BY MOUTH DAILY.     tamsulosin 0.4 MG Caps capsule  Commonly known as:  FLOMAX  Take 1 capsule by mouth 2 (two) times daily.     Vitamin D (Ergocalciferol) 50000 units Caps capsule  Commonly known as:  DRISDOL  Take 50,000 Units by mouth every 7 (seven) days. Mondays.     XIFAXAN 550 MG Tabs tablet  Generic drug:  rifaximin  TAKE 1 TABLET (550 MG TOTAL) BY MOUTH 2 (TWO) TIMES DAILY.       No Known Allergies Follow-up Information    Follow up with US guided paracentesis On 08/07/2015.      Follow up with Jonathan Shorts, MD On 08/09/2015.   Specialty:  Gastroenterology   Why:  for cirrhosis, ascites   Contact information:   [address] [place] [address] [phone]        The results of significant diagnostics from this hospitalization (including imaging, microbiology, ancillary and laboratory) are listed below for reference.    Significant Diagnostic Studies: Dg Chest 2 View  08/04/2015  CLINICAL DATA:  Progressive  abdominal and chest pain since this morning. Shortness of breath. Ascites and abdominal distention. EXAM: CHEST  2 VIEW COMPARISON:  07/10/2015 FINDINGS: Very low lung volumes are again seen with bibasilar atelectasis. No evidence of pulmonary consolidation or pleural effusion. No pneumothorax visualized. Heart size is within normal limits. IMPRESSION: Very low lung volumes with bibasilar atelectasis. Electronically Signed   By: Earle Gell M.D.   On: 08/04/2015 21:00   Dg Chest 2 View  07/10/2015  CLINICAL DATA:  Generalized weakness and aching pain. EXAM: CHEST - 2 VIEW COMPARISON:  One-view chest x-ray 04/26/2015. Two-view chest x-ray 10/21/2014 FINDINGS: Low lung volumes exaggerate the heart size.  Mild bibasilar atelectasis or scarring is again seen. No other significant airspace consolidation is present. There is no edema or effusion to suggest failure. Exaggerated thoracic kyphosis is similar the prior exam. IMPRESSION: No acute cardiopulmonary disease or significant interval change. Electronically Signed   By: San Morelle M.D.   On: 07/10/2015 16:39   Ct Head Wo Contrast  07/10/2015  CLINICAL DATA:  Hx of cirrhosis, need for liver transplant. Here today for generalized weakness, generalized aching pain. Wife states all of this over the last three days. Presents at the ER with lethargy, mild confusion, CBG 64 EXAM: CT HEAD WITHOUT CONTRAST TECHNIQUE: Contiguous axial images were obtained from the base of the skull through the vertex without intravenous contrast. COMPARISON:  PET-CT 04/2015 FINDINGS: No acute intracranial hemorrhage. No focal mass lesion. No CT evidence of acute infarction. No midline shift or mass effect. No hydrocephalus. Basilar cisterns are patent. Mild generalized cortical atrophy periventricular white matter hypodensities not changed prior. Paranasal sinuses and  mastoid air cells are clear. IMPRESSION: 1. No acute intracranial findings. 2. Stable atrophy and white matter  microvascular disease. Electronically Signed   By: Suzy Bouchard M.D.   On: 07/10/2015 16:33   US Renal  07/27/2015  CLINICAL DATA:  Acute renal failure. EXAM: RENAL / URINARY TRACT ULTRASOUND COMPLETE COMPARISON:  Multiple prior ultrasounds for paracentesis localization. FINDINGS: Right Kidney: Length: 11.1 cm. Echogenicity within normal limits. No mass or hydronephrosis visualized. Left Kidney: Length: 11.8 cm. Echogenicity within normal limits. No mass or hydronephrosis visualized. Bladder: Decompressed by Foley catheter. Additional findings:  Splenomegaly.  Ascites. IMPRESSION: No hydronephrosis. Normal renal architecture. No evidence for bladder outlet obstruction. Electronically Signed   By: Staci Righter M.D.   On: 07/27/2015 10:04   US Paracentesis  08/05/2015  INDICATION: Cirrhosis. Renal failure. Recurrent ascites. Request for therapeutic paracentesis of up to 2 L max. EXAM: ULTRASOUND GUIDED RIGHT LOWER QUADRANT PARACENTESIS MEDICATIONS: None. COMPLICATIONS: None immediate. PROCEDURE: Informed written consent was obtained from the patient after a discussion of the risks, benefits and alternatives to treatment. A timeout was performed prior to the initiation of the procedure. Initial ultrasound scanning demonstrates a large amount of ascites within the right lower abdominal quadrant. The right lower abdomen was prepped and draped in the usual sterile fashion. 1% lidocaine with epinephrine was used for local anesthesia. Following this, a 19 gauge, 7 cm Yueh catheter was introduced. An ultrasound image was saved for documentation purposes. The paracentesis was performed. The catheter was removed and a dressing was applied. The patient tolerated the procedure well without immediate post procedural complication. FINDINGS: A total of approximately 2 L of clear yellow fluid was removed. Samples were sent to the laboratory as requested by the clinical team. IMPRESSION: Successful ultrasound-guided  paracentesis yielding to liters of peritoneal fluid. Read by: Ascencion Dike PA-C Electronically Signed   By: Jerilynn Mages.  Shick M.D.   On: 08/05/2015 12:22   US Paracentesis  07/31/2015  INDICATION: Abdominal distention secondary to recurrent ascites. Request therapeutic paracentesis of up to 2 L max. EXAM: ULTRASOUND GUIDED LEFT LOWER PARACENTESIS MEDICATIONS: None. ANESTHESIA/SEDATION: Moderate Sedation Time:  NONE COMPLICATIONS: None immediate. PROCEDURE: Informed written consent was obtained from the patient after a discussion of the risks, benefits and alternatives to treatment. A timeout was performed prior to the initiation of the procedure. Initial ultrasound scanning demonstrates a large amount of ascites within the right lower abdominal quadrant. The right lower abdomen was prepped and draped in the usual sterile fashion. 1%  lidocaine with epinephrine was used for local anesthesia. Under direct ultrasound guidance, a 19 gauge, 7-cm, Yueh catheter was introduced. An ultrasound image was saved for documentation purposes. The paracentesis was performed. The catheter was removed and a dressing was applied. The patient tolerated the procedure well without immediate post procedural complication. FINDINGS: A total of approximately 2 L of clear yellow fluid was removed. Samples were sent to the laboratory as requested by the clinical team. IMPRESSION: Successful ultrasound-guided paracentesis yielding 2 liters of peritoneal fluid. Read by: Ascencion Dike PA-C Electronically Signed   By: Jerilynn Mages.  Shick M.D.   On: 07/31/2015 08:38   US Paracentesis  07/24/2015  INDICATION: Cirrhosis with recurrent large volume ascites. Request diagnostic and therapeutic paracentesis EXAM: ULTRASOUND-GUIDED PARACENTESIS COMPARISON:  None. MEDICATIONS: 1% Lidocaine COMPLICATIONS: None immediate TECHNIQUE: Informed written consent was obtained from the patient after a discussion of the risks, benefits and alternatives to treatment. A timeout  was performed prior to the initiation of the procedure. Initial ultrasound scanning demonstrates a large amount of ascites within the right lower abdominal quadrant. An ultrasound image was saved for documentation purposes. The right lower abdomen was prepped and draped in the usual sterile fashion. 1% lidocaine with epinephrine was used for local anesthesia. Under direct ultrasound guidance, a 19 gauge, 7-cm, Yueh catheter was introduced. The paracentesis was performed. The catheter was removed and a dressing was applied. The patient tolerated the procedure well without immediate post procedural complication. FINDINGS: A total of approximately 8 liters of clear yellow fluid was removed. Samples were sent to the laboratory as requested by the clinical team. IMPRESSION: Successful ultrasound-guided paracentesis yielding 8 liters of peritoneal fluid. Read by:  Gareth Eagle, PA-C Electronically Signed   By: Marybelle Killings M.D.   On: 07/24/2015 10:07    Microbiology: No results found for this or any previous visit (from the past 240 hour(s)).   Labs: Basic Metabolic Panel:  Recent Labs Lab 07/30/15 0514 07/31/15 0355 08/04/15 2106  NA 133* 133* 130*  K 4.4 5.0 4.9  CL 109 107 107  CO2 16* 21* 16*  GLUCOSE 177* 276* 211*  BUN 41* 39* 49*  CREATININE 1.34* 1.56* 1.87*  CALCIUM 9.1 9.4 9.2   Liver Function Tests:  Recent Labs Lab 08/04/15 2106  AST 34  ALT 26  ALKPHOS 77  BILITOT 1.3*  PROT 6.0*  ALBUMIN 3.3*    Recent Labs Lab 08/04/15 2106  LIPASE 62*    Recent Labs Lab 07/31/15 0355  AMMONIA 63*   CBC:  Recent Labs Lab 07/30/15 0514 07/31/15 0355 08/04/15 2106  WBC 3.2* 2.8* 3.3*  HGB 10.0* 9.6* 9.9*  HCT 28.7* 28.2* 28.7*  MCV 89.1 92.2 90.8  PLT 23* 25* 26*   Cardiac Enzymes: No results for input(s): CKTOTAL, CKMB, CKMBINDEX, TROPONINI in the last 168 hours. BNP: BNP (last 3 results)  Recent Labs  07/25/15 1144  BNP 75.7    ProBNP (last 3 results) No  results for input(s): PROBNP in the last 8760 hours.  CBG:  Recent Labs Lab 07/30/15 1655 07/30/15 2114 07/31/15 0729 07/31/15 1150 08/05/15 1008  GLUCAP 221* 226* 186* 274* 125*       Signed:  Chalyn Amescua MD, PhD  [facility] 08/05/2015, 2:17 PM

## 2015-08-05 NOTE — H&P (Signed)
[facility] History and Physical  [name] [id number] DOB: Nov 18, 1950 DOA: 08/04/2015  Referring physician: EDP PCP: [name], MD   Chief Complaint: Ascites   HPI: [name] is a 65 y.o. 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  Patient presents to ED with c/o worsening ascites, he has appointment on the 31st for drainage but says he cant wait.  IR has drainage scheduled for 9AM tomorrow.  Hospitalist asked to admit to obs for ascities drainage.  Review of Systems: Systems reviewed.  As above, otherwise negative  Past Medical History  Diagnosis Date  . Ascites   . Acute hepatic encephalopathy ([place])   . Acute renal failure superimposed on stage 3 chronic kidney disease ([place])   . Anemia of chronic disease   . Bleeding esophageal varices ([place])   . Coagulopathy ([place])   . Diabetes mellitus type II, controlled ([place])   . Depression   . Diabetic neuropathy ([place])   . Esophageal stricture   . NASH (nonalcoholic steatohepatitis)   . Protein-calorie malnutrition, severe ([place])   . Secondary biliary cirrhosis ([place])   . Thrombocytopenia [facility])    Past Surgical History  Procedure Laterality Date  . Kidney stone surgery  yrs ago  . Esophagogastroduodenoscopy  08/01/2011    Procedure: ESOPHAGOGASTRODUODENOSCOPY (EGD);  Surgeon: Scarlette Shorts, MD;  Location: [facility];  Service: Endoscopy;  Laterality: N/A;  . Esophagogastroduodenoscopy N/A 10/27/2012    Procedure: ESOPHAGOGASTRODUODENOSCOPY (EGD);  Surgeon: Inda Castle, MD;  Location: [facility];  Service: Endoscopy;  Laterality: N/A;  . Esophagogastroduodenoscopy N/A 11/22/2012    Procedure: ESOPHAGOGASTRODUODENOSCOPY (EGD);  Surgeon: Irene Shipper, MD;  Location: [facility];  Service: Endoscopy;  Laterality: N/A;  . Colonoscopy    . Upper gastrointestinal endoscopy    .  Esophagogastroduodenoscopy N/A 11/29/2013    Procedure: ESOPHAGOGASTRODUODENOSCOPY (EGD);  Surgeon: Irene Shipper, MD;  Location: [facility];  Service: Endoscopy;  Laterality: N/A;  . Esophageal banding Bilateral 11/29/2013    Procedure: ESOPHAGEAL BANDING;  Surgeon: Irene Shipper, MD;  Location: [facility] ENDOSCOPY;  Service: Endoscopy;  Laterality: Bilateral;  . Esophagogastroduodenoscopy N/A 06/13/2014    Procedure: ESOPHAGOGASTRODUODENOSCOPY (EGD);  Surgeon: Irene Shipper, MD;  Location: [facility];  Service: Endoscopy;  Laterality: N/A;  . Esophageal banding N/A 06/13/2014    Procedure: ESOPHAGEAL BANDING;  Surgeon: Irene Shipper, MD;  Location: [facility] ENDOSCOPY;  Service: Endoscopy;  Laterality: N/A;   Social History:  reports that he has never smoked. He has never used smokeless tobacco. He reports that he does not drink alcohol or use illicit drugs.  No Known Allergies  Family History  Problem Relation Age of Onset  . Colon cancer Neg Hx   . Anesthesia problems Neg Hx   . Hypotension Neg Hx   . Malignant hyperthermia Neg Hx   . Pseudochol deficiency Neg Hx   . Diabetes Father   . Heart disease Father   . Aneurysm Mother   . Heart disease Other     neice  . Diabetes      niece     Prior to Admission medications   Medication Sig Start Date End Date Taking? Authorizing Provider  amoxicillin (AMOXIL) 500 MG capsule Take 1 capsule (500 mg total) by mouth 3 (three) times daily. 01/16/15  Yes Truman Hayward, MD  baclofen (LIORESAL) 20 MG tablet Take 1 tablet (20 mg total) by  mouth at bedtime. 06/11/15  Yes Meredith Staggers, MD  bethanechol (URECHOLINE) 50 MG tablet Take 1 tablet by mouth 2 (two) times daily.  12/19/14  Yes Historical Provider, MD  fluconazole (DIFLUCAN) 100 MG tablet Take 1 tablet by mouth daily. 07/19/15  Yes Historical Provider, MD  FLUoxetine (PROZAC) 10 MG tablet Take 10 mg by mouth daily.   Yes Historical Provider, MD  fluticasone (FLONASE) 50 MCG/ACT nasal spray Place 1  spray into both nostrils daily. 08/02/15  Yes Historical Provider, MD  gabapentin (NEURONTIN) 100 MG capsule Take 100 mg by mouth 3 (three) times daily.    Yes Historical Provider, MD  Insulin Degludec (TRESIBA FLEXTOUCH) 200 UNIT/ML SOPN Inject 20 Units into the skin daily. 07/31/15  Yes Belkys A Regalado, MD  lactulose (CHRONULAC) 10 GM/15ML solution Take 45 mLs (30 g total) by mouth 3 (three) times daily. 07/31/15  Yes Belkys A Regalado, MD  Multiple Vitamin (MULTIVITAMIN WITH MINERALS) TABS tablet Take 1 tablet by mouth daily.   Yes Historical Provider, MD  pantoprazole (PROTONIX) 40 MG tablet TAKE 1 TABLET (40 MG TOTAL) BY MOUTH DAILY. 11/13/14  Yes Irene Shipper, MD  tamsulosin (FLOMAX) 0.4 MG CAPS capsule Take 1 capsule by mouth 2 (two) times daily.  12/19/14  Yes Historical Provider, MD  Vitamin D, Ergocalciferol, (DRISDOL) 50000 UNITS CAPS capsule Take 50,000 Units by mouth every 7 (seven) days. Mondays. 03/29/15  Yes Historical Provider, MD  XIFAXAN 550 MG TABS tablet TAKE 1 TABLET (550 MG TOTAL) BY MOUTH 2 (TWO) TIMES DAILY. 06/27/15  Yes Irene Shipper, MD  Amino Acids-Protein Hydrolys (FEEDING SUPPLEMENT, PRO-STAT SUGAR FREE 64,) LIQD Take 30 mLs by mouth 3 (three) times daily. 07/31/15   Elmarie Shiley, MD   Physical Exam: Filed Vitals:   08/04/15 2200 08/04/15 2310  BP: 123/80 125/79  Pulse: 77 83  Temp:    Resp: 17 17    BP 125/79 mmHg  Pulse 83  Temp(Src) 98.6 F (37 C) (Oral)  Resp 17  Wt 88.622 kg (195 lb 6 oz)  SpO2 99%  General Appearance:    Alert, oriented, no distress, appears stated age  Head:    Normocephalic, atraumatic  Eyes:    PERRL, EOMI, sclera non-icteric        Nose:   Nares without drainage or epistaxis. Mucosa, turbinates normal  Throat:   Moist mucous membranes. Oropharynx without erythema or exudate.  Neck:   Supple. No carotid bruits.  No thyromegaly.  No lymphadenopathy.   Back:     No CVA tenderness, no spinal tenderness  Lungs:     Clear to  auscultation bilaterally, without wheezes, rhonchi or rales  Chest wall:    No tenderness to palpitation  Heart:    Regular rate and rhythm without murmurs, gallops, rubs  Abdomen:     Soft, non-tender, nondistended, normal bowel sounds, no organomegaly  Genitalia:    deferred  Rectal:    deferred  Extremities:   No clubbing, cyanosis or edema.  Pulses:   2+ and symmetric all extremities  Skin:   Skin color, texture, turgor normal, no rashes or lesions  Lymph nodes:   Cervical, supraclavicular, and axillary nodes normal  Neurologic:   CNII-XII intact. Normal strength, sensation and reflexes      throughout    Labs on Admission:  Basic Metabolic Panel:  Recent Labs Lab 07/29/15 0505 07/30/15 0514 07/31/15 0355 08/04/15 2106  NA 133* 133* 133* 130*  K 4.6 4.4  5.0 4.9  CL 108 109 107 107  CO2 17* 16* 21* 16*  GLUCOSE 209* 177* 276* 211*  BUN 34* 41* 39* 49*  CREATININE 1.53* 1.34* 1.56* 1.87*  CALCIUM 8.7* 9.1 9.4 9.2   Liver Function Tests:  Recent Labs Lab 07/29/15 0505 08/04/15 2106  AST 22 34  ALT 17 26  ALKPHOS 56 77  BILITOT 1.5* 1.3*  PROT 5.7* 6.0*  ALBUMIN 3.2* 3.3*    Recent Labs Lab 08/04/15 2106  LIPASE 62*    Recent Labs Lab 07/29/15 0505 07/31/15 0355  AMMONIA 58* 63*   CBC:  Recent Labs Lab 07/29/15 0505 07/30/15 0514 07/31/15 0355 08/04/15 2106  WBC 3.8* 3.2* 2.8* 3.3*  HGB 10.2* 10.0* 9.6* 9.9*  HCT 30.0* 28.7* 28.2* 28.7*  MCV 91.7 89.1 92.2 90.8  PLT 27* 23* 25* 26*   Cardiac Enzymes: No results for input(s): CKTOTAL, CKMB, CKMBINDEX, TROPONINI in the last 168 hours.  BNP (last 3 results) No results for input(s): PROBNP in the last 8760 hours. CBG:  Recent Labs Lab 07/30/15 1228 07/30/15 1655 07/30/15 2114 07/31/15 0729 07/31/15 1150  GLUCAP 263* 221* 226* 186* 274*    Radiological Exams on Admission: Dg Chest 2 View  08/04/2015  CLINICAL DATA:  Progressive abdominal and chest pain since this morning. Shortness  of breath. Ascites and abdominal distention. EXAM: CHEST  2 VIEW COMPARISON:  07/10/2015 FINDINGS: Very low lung volumes are again seen with bibasilar atelectasis. No evidence of pulmonary consolidation or pleural effusion. No pneumothorax visualized. Heart size is within normal limits. IMPRESSION: Very low lung volumes with bibasilar atelectasis. Electronically Signed   By: Earle Gell M.D.   On: 08/04/2015 21:00    EKG: Independently reviewed.  Assessment/Plan Principal Problem:   Ascites   1. Ascites - plan for IR drainage of 2L tomorrow at 9AM 1. Patient should be good for discharge after this 2. Chronic and stable conditions including: NASH, cirrhosis, epidural abscess, thrombocytopenia, CKD, DM - continuing home meds.    Code Status: Full  Family Communication: Family at bedside Disposition Plan: Admit to obs, home in AM   Time spent: 30 min  Kamden Stanislaw M. [facility] Pager [phone]  If 7AM-7PM, please contact the day team taking care of the patient [email or web address] Password [facility] 08/05/2015, 12:35 AM

## 2015-08-07 ENCOUNTER — Encounter (HOSPITAL_COMMUNITY): Payer: Self-pay

## 2015-08-07 ENCOUNTER — Ambulatory Visit (HOSPITAL_COMMUNITY)
Admission: RE | Admit: 2015-08-07 | Discharge: 2015-08-07 | Disposition: A | Discharge status: Home / Self Care | Payer: Managed Care, Other (non HMO) | Source: Ambulatory Visit | Attending: Internal Medicine | Admitting: Internal Medicine

## 2015-08-07 ENCOUNTER — Other Ambulatory Visit (INDEPENDENT_AMBULATORY_CARE_PROVIDER_SITE_OTHER): Payer: Managed Care, Other (non HMO)

## 2015-08-07 ENCOUNTER — Encounter (HOSPITAL_COMMUNITY)
Admission: RE | Admit: 2015-08-07 | Discharge: 2015-08-07 | Disposition: A | Discharge status: Home / Self Care | Payer: Managed Care, Other (non HMO) | Source: Ambulatory Visit | Attending: Internal Medicine | Admitting: Internal Medicine

## 2015-08-07 DIAGNOSIS — K7460 Unspecified cirrhosis of liver: Secondary | ICD-10-CM | POA: Insufficient documentation

## 2015-08-07 DIAGNOSIS — R188 Other ascites: Secondary | ICD-10-CM | POA: Diagnosis not present

## 2015-08-07 DIAGNOSIS — N19 Unspecified kidney failure: Secondary | ICD-10-CM | POA: Insufficient documentation

## 2015-08-07 LAB — BODY FLUID CELL COUNT WITH DIFFERENTIAL
LYMPHS FL: 10 %
Monocyte-Macrophage-Serous Fluid: 88 % (ref 50–90)
NEUTROPHIL FLUID: 2 % (ref 0–25)
WBC FLUID: 83 uL (ref 0–1000)

## 2015-08-07 LAB — BASIC METABOLIC PANEL
BUN: 42 mg/dL — ABNORMAL HIGH (ref 6–23)
CALCIUM: 9.6 mg/dL (ref 8.4–10.5)
CO2: 21 meq/L (ref 19–32)
CREATININE: 1.69 mg/dL — AB (ref 0.40–1.50)
Chloride: 106 mEq/L (ref 96–112)
GFR: 43.79 mL/min — ABNORMAL LOW (ref >60.00)
Glucose, Bld: 188 mg/dL — ABNORMAL HIGH (ref 70–99)
Potassium: 4.6 mEq/L (ref 3.5–5.1)
Sodium: 133 mEq/L — ABNORMAL LOW (ref 135–145)

## 2015-08-07 MED ORDER — ALBUMIN HUMAN 25 % IV SOLN
25.0000 g | Freq: Once | INTRAVENOUS | Status: AC
  Administered 2015-08-07: 25 g via INTRAVENOUS
  Filled 2015-08-07: qty 100

## 2015-08-07 MED ORDER — SODIUM CHLORIDE 0.9 % IV SOLN
INTRAVENOUS | Status: DC
  Administered 2015-08-07: 12:00:00 via INTRAVENOUS

## 2015-08-07 NOTE — Procedures (Signed)
Ultrasound-guided diagnostic and therapeutic paracentesis performed yielding 2 liters (maximum ordered) of yellow fluid. No immediate complications. A portion of the fluid was sent to the lab for preordered studies.

## 2015-08-08 ENCOUNTER — Other Ambulatory Visit: Payer: Self-pay

## 2015-08-08 DIAGNOSIS — K7469 Other cirrhosis of liver: Secondary | ICD-10-CM

## 2015-08-08 LAB — PATHOLOGIST SMEAR REVIEW

## 2015-08-09 ENCOUNTER — Other Ambulatory Visit: Payer: Self-pay

## 2015-08-09 ENCOUNTER — Encounter: Payer: Self-pay | Admitting: Internal Medicine

## 2015-08-09 ENCOUNTER — Telehealth: Payer: Self-pay

## 2015-08-09 ENCOUNTER — Ambulatory Visit (INDEPENDENT_AMBULATORY_CARE_PROVIDER_SITE_OTHER): Payer: Managed Care, Other (non HMO) | Admitting: Internal Medicine

## 2015-08-09 VITALS — BP 110/74 | HR 76 | Ht 70.0 in | Wt 192.4 lb

## 2015-08-09 DIAGNOSIS — R188 Other ascites: Secondary | ICD-10-CM

## 2015-08-09 DIAGNOSIS — G9340 Encephalopathy, unspecified: Secondary | ICD-10-CM

## 2015-08-09 DIAGNOSIS — N289 Disorder of kidney and ureter, unspecified: Secondary | ICD-10-CM

## 2015-08-09 DIAGNOSIS — I8501 Esophageal varices with bleeding: Secondary | ICD-10-CM

## 2015-08-09 DIAGNOSIS — K7469 Other cirrhosis of liver: Secondary | ICD-10-CM | POA: Diagnosis not present

## 2015-08-09 MED ORDER — LACTULOSE 10 GM/15ML PO SOLN: 30.0000 g | Freq: Three times a day (TID) | ORAL | Status: DC

## 2015-08-09 NOTE — Progress Notes (Signed)
HISTORY OF PRESENT ILLNESS:  [name] is a 65 y.o. 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 Complications of his liver disease have included variceal bleeding, encephalopathy, and ascites. 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 I last saw the patient 08/18/2014. See that dictation. He did complete Twinrix vaccination series. He is due for his [facility] liver clinic follow-up soon. He was hospitalized 2 weeks ago and cared for by my colleagues. The principal problem was encephalopathy. Secondary problem was worsening renal function. He has had intractable ascites. He was seen by nephrology. Diuretics were discontinued. Limited paracentesis up to 2 L several times per week recommended. He was discharged from the hospital 1 week ago. Has had small-volume outpatient paracentesis without SBP. He is requesting larger volume paracentesis. I discussed with him issues regarding his kidneys. He has been compliant with medications for hepatic encephalopathy. No evidence of problems according to his wife since discharge. He seems a bit disgruntled today  REVIEW OF SYSTEMS:  All non-GI ROS negative except for fatigue  Past Medical History  Diagnosis Date  . Ascites   . Acute hepatic encephalopathy ([place])   . Acute renal failure superimposed on stage 3 chronic kidney disease ([place])   . Anemia of chronic disease   . Bleeding esophageal varices ([place])   . Coagulopathy ([place])   . Diabetes mellitus type II, controlled ([place])   . Depression   . Diabetic neuropathy ([place])   . Esophageal stricture   . NASH (nonalcoholic steatohepatitis)   . Protein-calorie malnutrition, severe ([place])   . Secondary biliary cirrhosis ([place])   . Thrombocytopenia  [facility])     Past Surgical History  Procedure Laterality Date  . Kidney stone surgery  yrs ago  . Esophagogastroduodenoscopy  08/01/2011    Procedure: ESOPHAGOGASTRODUODENOSCOPY (EGD);  Surgeon: Scarlette Shorts, MD;  Location: [facility];  Service: Endoscopy;  Laterality: N/A;  . Esophagogastroduodenoscopy N/A 10/27/2012    Procedure: ESOPHAGOGASTRODUODENOSCOPY (EGD);  Surgeon: Inda Castle, MD;  Location: [facility];  Service: Endoscopy;  Laterality: N/A;  . Esophagogastroduodenoscopy N/A 11/22/2012    Procedure: ESOPHAGOGASTRODUODENOSCOPY (EGD);  Surgeon: Irene Shipper, MD;  Location: [facility];  Service: Endoscopy;  Laterality: N/A;  . Colonoscopy    . Upper gastrointestinal endoscopy    . Esophagogastroduodenoscopy N/A 11/29/2013    Procedure: ESOPHAGOGASTRODUODENOSCOPY (EGD);  Surgeon: Irene Shipper, MD;  Location: [facility];  Service: Endoscopy;  Laterality: N/A;  . Esophageal banding Bilateral 11/29/2013    Procedure: ESOPHAGEAL BANDING;  Surgeon: Irene Shipper, MD;  Location: [facility];  Service: Endoscopy;  Laterality: Bilateral;  . Esophagogastroduodenoscopy N/A 06/13/2014    Procedure: ESOPHAGOGASTRODUODENOSCOPY (EGD);  Surgeon: Irene Shipper, MD;  Location: [facility];  Service: Endoscopy;  Laterality: N/A;  . Esophageal banding N/A 06/13/2014    Procedure: ESOPHAGEAL BANDING;  Surgeon: Irene Shipper, MD;  Location: [facility]OS[facility];  Service: Endoscopy;  Laterality: N/A;    Social History Smaran Bee Hazel  reports that he has never smoked. He has never used smokeless tobacco. He reports that he does not drink alcohol or use illicit drugs.  family history includes Aneurysm in his mother; Diabetes in his father; Heart disease in his father and other. There is no history of Colon cancer, Anesthesia  problems, Hypotension, Malignant hyperthermia, or Pseudochol deficiency.  No Known Allergies     PHYSICAL EXAMINATION: Vital signs: BP 110/74 mmHg  Pulse 76  Ht 5\' 10"  (1.778 m)  Wt  192 lb 6.4 oz (87.272 kg)  BMI 27.61 kg/m2  Constitutional: Chronically ill-appearing, no acute distress Psychiatric: alert and oriented x3, cooperative Eyes: extraocular movements intact, anicteric, conjunctiva pink Mouth: oral pharynx moist, no lesions Neck: supple no lymphadenopathy Cardiovascular: heart regular rate and rhythm, no murmur Lungs: clear to auscultation bilaterally Abdomen: soft, nontender, distended with obvious non-tense ascites, no peritoneal signs, normal bowel sounds, no organomegaly Rectal: Omitted Extremities: 1+ lower extremity edema bilaterally. No clubbing or cyanosis Skin: no lesions on visible extremities Neuro: No focal deficits. No asterixis.  ASSESSMENT:  #1. NASH cirrhosis with portal hypertension and refractory ascites #2. Renal insufficiency #3. History of variceal bleed #4. Recurrent hepatic encephalopathy #5. History of cervical epidural abscess #6. Follow-up [facility] liver transplant center. Not actively listed patient for medical reasons #7. Variable medical compliance over the years   PLAN:  #1. Continue with lower volume paracentesis twice per week. Will increase to 3 L with albumin replacement. He is insistent. Understands the risk. Arranged #2. Send fluids for cell count with differential. Arranged #3. Hold diuretics until further notice #4. Continue to monitor renal function regularly. Arranged #5. Continue Xifaxan and lactulose for hepatic encephalopathy. Refill lactulose per his request #6. Ongoing continuity care with ID clinic #7. Ongoing continuity care with liver transplant center. A request that we contact the Center to move up his appointment. We have today #8. Ongoing continuity care with his PCP for multiple medical problems #9. Routine GI follow-up 3 months.  The patient continues to decline over time. I'm concerned about his short and long-term prognosis. Discussed   40 minutes spent face-to-face with the patient his wife.  Greater than 50% of the time used for counseling regarding his, located liver disease and other issues

## 2015-08-09 NOTE — Telephone Encounter (Signed)
Pt scheduled to have [place] para with albumin replacement. Pt to have no more than 3 liters drawn off, fluid for cell count and diff, pt to receive albumin 8gm iv for each liter removed. Pt scheduled for the following dates at [facility]:  2/13@2  2/16@2  2/20@2  2/23@2  2/27@2  3/2@2  3/6@2  3/9@2   Pt to arrive there at 1:45pm for each appt. Spoke with pts wife and she is aware of appts.

## 2015-08-09 NOTE — Patient Instructions (Signed)
Continue to have your labs drawn weekly.  [name] will work on scheduling upcoming paracenteses and call you.  I will continue to try to contact the liver clinic on your behalf

## 2015-08-10 ENCOUNTER — Telehealth: Payer: Self-pay | Admitting: Internal Medicine

## 2015-08-10 ENCOUNTER — Encounter (HOSPITAL_COMMUNITY)
Admission: RE | Admit: 2015-08-10 | Discharge: 2015-08-10 | Disposition: A | Discharge status: Home / Self Care | Payer: Managed Care, Other (non HMO) | Source: Ambulatory Visit | Attending: Internal Medicine | Admitting: Internal Medicine

## 2015-08-10 ENCOUNTER — Other Ambulatory Visit: Payer: Self-pay

## 2015-08-10 ENCOUNTER — Ambulatory Visit (HOSPITAL_COMMUNITY)
Admission: RE | Admit: 2015-08-10 | Discharge: 2015-08-10 | Disposition: A | Discharge status: Home / Self Care | Payer: Managed Care, Other (non HMO) | Source: Ambulatory Visit | Attending: Internal Medicine | Admitting: Internal Medicine

## 2015-08-10 ENCOUNTER — Encounter (HOSPITAL_COMMUNITY): Payer: Self-pay

## 2015-08-10 DIAGNOSIS — N19 Unspecified kidney failure: Secondary | ICD-10-CM | POA: Diagnosis not present

## 2015-08-10 DIAGNOSIS — R188 Other ascites: Secondary | ICD-10-CM | POA: Diagnosis not present

## 2015-08-10 DIAGNOSIS — K7460 Unspecified cirrhosis of liver: Secondary | ICD-10-CM | POA: Insufficient documentation

## 2015-08-10 LAB — BODY FLUID CELL COUNT WITH DIFFERENTIAL
Lymphs, Fluid: 18 %
MONOCYTE-MACROPHAGE-SEROUS FLUID: 77 % (ref 50–90)
Neutrophil Count, Fluid: 5 % (ref 0–25)
WBC FLUID: 81 uL (ref 0–1000)

## 2015-08-10 MED ORDER — SODIUM CHLORIDE 0.9 % IV SOLN: 250.0000 mL | INTRAVENOUS | Status: DC

## 2015-08-10 MED ORDER — LACTULOSE 10 GM/15ML PO SOLN: 30.0000 g | Freq: Three times a day (TID) | ORAL | Status: DC

## 2015-08-10 MED ORDER — SODIUM CHLORIDE 0.9 % IV SOLN
250.0000 mL | INTRAVENOUS | Status: DC
  Administered 2015-08-10: 250 mL via INTRAVENOUS

## 2015-08-10 MED ORDER — ALBUMIN HUMAN 25 % IV SOLN
25.0000 g | Freq: Once | INTRAVENOUS | Status: AC
  Administered 2015-08-10: 25 g via INTRAVENOUS
  Filled 2015-08-10: qty 100

## 2015-08-10 NOTE — Procedures (Signed)
Ultrasound-guided diagnostic and therapeutic paracentesis performed yielding 3 liters (maximum ordered) of yellow  fluid. No immediate complications. A portion of the fluid was sent to the lab for cell count/ differential. The patient will receive IV albumin infusion post procedure.

## 2015-08-10 NOTE — Telephone Encounter (Signed)
Sent Lactulose to [facility]

## 2015-08-13 ENCOUNTER — Encounter: Payer: Self-pay | Admitting: Infectious Disease

## 2015-08-13 ENCOUNTER — Ambulatory Visit (INDEPENDENT_AMBULATORY_CARE_PROVIDER_SITE_OTHER): Payer: Managed Care, Other (non HMO) | Admitting: Infectious Disease

## 2015-08-13 ENCOUNTER — Telehealth: Payer: Self-pay | Admitting: Internal Medicine

## 2015-08-13 ENCOUNTER — Ambulatory Visit (HOSPITAL_COMMUNITY)
Admission: RE | Admit: 2015-08-13 | Discharge: 2015-08-13 | Disposition: A | Discharge status: Home / Self Care | Payer: Managed Care, Other (non HMO) | Source: Ambulatory Visit | Attending: Internal Medicine | Admitting: Internal Medicine

## 2015-08-13 ENCOUNTER — Encounter (HOSPITAL_COMMUNITY)
Admission: RE | Admit: 2015-08-13 | Discharge: 2015-08-13 | Disposition: A | Discharge status: Home / Self Care | Payer: Managed Care, Other (non HMO) | Source: Ambulatory Visit | Attending: Internal Medicine | Admitting: Internal Medicine

## 2015-08-13 VITALS — BP 111/69 | HR 71 | Temp 97.7°F | Ht 70.0 in | Wt 188.0 lb

## 2015-08-13 DIAGNOSIS — K744 Secondary biliary cirrhosis: Secondary | ICD-10-CM

## 2015-08-13 DIAGNOSIS — R7881 Bacteremia: Secondary | ICD-10-CM | POA: Diagnosis not present

## 2015-08-13 DIAGNOSIS — G0491 Myelitis, unspecified: Secondary | ICD-10-CM

## 2015-08-13 DIAGNOSIS — R188 Other ascites: Secondary | ICD-10-CM | POA: Insufficient documentation

## 2015-08-13 DIAGNOSIS — G062 Extradural and subdural abscess, unspecified: Secondary | ICD-10-CM | POA: Diagnosis not present

## 2015-08-13 DIAGNOSIS — K7581 Nonalcoholic steatohepatitis (NASH): Secondary | ICD-10-CM | POA: Diagnosis not present

## 2015-08-13 DIAGNOSIS — B954 Other streptococcus as the cause of diseases classified elsewhere: Secondary | ICD-10-CM

## 2015-08-13 DIAGNOSIS — K766 Portal hypertension: Secondary | ICD-10-CM

## 2015-08-13 DIAGNOSIS — G061 Intraspinal abscess and granuloma: Secondary | ICD-10-CM | POA: Insufficient documentation

## 2015-08-13 DIAGNOSIS — D696 Thrombocytopenia, unspecified: Secondary | ICD-10-CM

## 2015-08-13 DIAGNOSIS — K7460 Unspecified cirrhosis of liver: Secondary | ICD-10-CM | POA: Diagnosis not present

## 2015-08-13 DIAGNOSIS — B955 Unspecified streptococcus as the cause of diseases classified elsewhere: Secondary | ICD-10-CM

## 2015-08-13 HISTORY — DX: Intraspinal abscess and granuloma: G06.1

## 2015-08-13 LAB — CBC WITH DIFFERENTIAL/PLATELET
BASOS ABS: 0 10*3/uL (ref 0.0–0.1)
Basophils Relative: 0 % (ref 0–1)
EOS ABS: 0.1 10*3/uL (ref 0.0–0.7)
EOS PCT: 4 % (ref 0–5)
HEMATOCRIT: 28.7 % — AB (ref 39.0–52.0)
Hemoglobin: 9.7 g/dL — ABNORMAL LOW (ref 13.0–17.0)
LYMPHS ABS: 0.3 10*3/uL — AB (ref 0.7–4.0)
LYMPHS PCT: 13 % (ref 12–46)
MCH: 30.9 pg (ref 26.0–34.0)
MCHC: 33.8 g/dL (ref 30.0–36.0)
MCV: 91.4 fL (ref 78.0–100.0)
MONOS PCT: 15 % — AB (ref 3–12)
MPV: 9.6 fL (ref 8.6–12.4)
Monocytes Absolute: 0.3 10*3/uL (ref 0.1–1.0)
Neutro Abs: 1.6 10*3/uL — ABNORMAL LOW (ref 1.7–7.7)
Neutrophils Relative %: 68 % (ref 43–77)
PLATELETS: 23 10*3/uL — AB (ref 150–400)
RBC: 3.14 MIL/uL — ABNORMAL LOW (ref 4.22–5.81)
RDW: 16 % — ABNORMAL HIGH (ref 11.5–15.5)
WBC: 2.3 10*3/uL — ABNORMAL LOW (ref 4.0–10.5)

## 2015-08-13 LAB — BASIC METABOLIC PANEL WITH GFR
BUN: 39 mg/dL — ABNORMAL HIGH (ref 7–25)
CALCIUM: 8.9 mg/dL (ref 8.6–10.3)
CHLORIDE: 105 mmol/L (ref 98–110)
CO2: 18 mmol/L — AB (ref 20–31)
CREATININE: 2.21 mg/dL — AB (ref 0.70–1.25)
GFR, Est African American: 36 mL/min — ABNORMAL LOW (ref >=60)
GFR, Est Non African American: 31 mL/min — ABNORMAL LOW (ref >=60)
GLUCOSE: 227 mg/dL — AB (ref 65–99)
Potassium: 5.3 mmol/L (ref 3.5–5.3)
Sodium: 132 mmol/L — ABNORMAL LOW (ref 135–146)

## 2015-08-13 LAB — BODY FLUID CELL COUNT WITH DIFFERENTIAL
EOS FL: 0 %
Lymphs, Fluid: 29 %
Monocyte-Macrophage-Serous Fluid: 71 % (ref 50–90)
NEUTROPHIL FLUID: 0 % (ref 0–25)
Total Nucleated Cell Count, Fluid: 58 cu mm (ref 0–1000)

## 2015-08-13 LAB — SEDIMENTATION RATE: Sed Rate: 3 mm/hr (ref 0–20)

## 2015-08-13 LAB — C-REACTIVE PROTEIN: CRP: 0.5 mg/dL (ref <0.60)

## 2015-08-13 MED ORDER — ALBUMIN HUMAN 25 % IV SOLN: 25.0000 g | Freq: Once | INTRAVENOUS | Status: DC

## 2015-08-13 MED ORDER — LIDOCAINE HCL (PF) 1 % IJ SOLN
INTRAMUSCULAR | Status: AC
Start: 2015-08-13 — End: 2015-08-13
  Filled 2015-08-13: qty 10

## 2015-08-13 MED ORDER — ALBUMIN HUMAN 25 % IV SOLN
25.0000 g | Freq: Once | INTRAVENOUS | Status: AC
  Administered 2015-08-13: 25 g via INTRAVENOUS
  Filled 2015-08-13: qty 100

## 2015-08-13 MED ORDER — AMOXICILLIN 500 MG PO CAPS: 500.0000 mg | ORAL_CAPSULE | Freq: Three times a day (TID) | ORAL | Status: DC

## 2015-08-13 NOTE — Progress Notes (Signed)
Chief complaint: worsening ascites, abdominal pain  Subjective:    Patient ID: [name], male    DOB: February 12, 1951, 65 y.o.   MRN: [id number]  HPI   65 y.o. male with with GROUP B STREPTOCCAL bacteremia and remarkable epidural abscess that extended fromthe foreman magnum down through C6 . He has comorbid cirrhotic liver with  thrombocytopenia and was not felt to be a good operative candidtate. Since being on ROcephin  he has had improvement in UE strength but his left leg has felt like a "board" and is painful and weak intermittently.   We changed him to high dose amoxicillin and he has had MRI of C and L spine. MRI C spine apparently last Spring that  showed residual infection and diskitis at C3 but Dr. Saintclair Halsted wished to continue with nonoperative management and protracted abx.  He had an even more  recent MRI of the cervical spine done on 05/09/2015. This showed evidence of significant edema within the cervical spinal cord, with expansion of the cord but improvement in diskitis. He was sent to Neurology for evaluation and rx of this  He is currently having therapeutic paracenteses performed every 3 days. He claims to be taking the lactuloase and says he had BM "all last night:' but none today.  Past Medical History  Diagnosis Date  . Ascites   . Acute hepatic encephalopathy ([place])   . Acute renal failure superimposed on stage 3 chronic kidney disease ([place])   . Anemia of chronic disease   . Bleeding esophageal varices ([place])   . Coagulopathy ([place])   . Diabetes mellitus type II, controlled ([place])   . Depression   . Diabetic neuropathy ([place])   . Esophageal stricture   . NASH (nonalcoholic steatohepatitis)   . Protein-calorie malnutrition, severe ([place])   . Secondary biliary cirrhosis ([place])   . Thrombocytopenia ([place])   . Abscess in epidural space of cervical spine 08/13/2015    Past Surgical History  Procedure Laterality Date  . Kidney stone surgery  yrs ago  .  Esophagogastroduodenoscopy  08/01/2011    Procedure: ESOPHAGOGASTRODUODENOSCOPY (EGD);  Surgeon: Scarlette Shorts, MD;  Location: [facility];  Service: Endoscopy;  Laterality: N/A;  . Esophagogastroduodenoscopy N/A 10/27/2012    Procedure: ESOPHAGOGASTRODUODENOSCOPY (EGD);  Surgeon: Inda Castle, MD;  Location: [facility];  Service: Endoscopy;  Laterality: N/A;  . Esophagogastroduodenoscopy N/A 11/22/2012    Procedure: ESOPHAGOGASTRODUODENOSCOPY (EGD);  Surgeon: Irene Shipper, MD;  Location: [facility];  Service: Endoscopy;  Laterality: N/A;  . Colonoscopy    . Upper gastrointestinal endoscopy    . Esophagogastroduodenoscopy N/A 11/29/2013    Procedure: ESOPHAGOGASTRODUODENOSCOPY (EGD);  Surgeon: Irene Shipper, MD;  Location: [facility];  Service: Endoscopy;  Laterality: N/A;  . Esophageal banding Bilateral 11/29/2013    Procedure: ESOPHAGEAL BANDING;  Surgeon: Irene Shipper, MD;  Location: [facility];  Service: Endoscopy;  Laterality: Bilateral;  . Esophagogastroduodenoscopy N/A 06/13/2014    Procedure: ESOPHAGOGASTRODUODENOSCOPY (EGD);  Surgeon: Irene Shipper, MD;  Location: [facility];  Service: Endoscopy;  Laterality: N/A;  . Esophageal banding N/A 06/13/2014    Procedure: ESOPHAGEAL BANDING;  Surgeon: Irene Shipper, MD;  Location: [facility];  Service: Endoscopy;  Laterality: N/A;    Family History  Problem Relation Age of Onset  . Colon cancer Neg Hx   . Anesthesia problems Neg Hx   . Hypotension Neg Hx   . Malignant hyperthermia Neg Hx   . Pseudochol deficiency Neg Hx   .  Diabetes Father   . Heart disease Father   . Aneurysm Mother   . Heart disease Other     neice  . Diabetes      niece      Social History   Social History  . Marital Status: Married    Spouse Name: N/A  . Number of Children: 1  . Years of Education: 13   Occupational History  . retired    Social History Main Topics  . Smoking status: Never Smoker   . Smokeless tobacco: Never Used  . Alcohol  Use: No     Comment: very rare  . Drug Use: No  . Sexual Activity: No   Other Topics Concern  . None   Social History Narrative   Patient drinks caffeine 1-2 times per week.   Patient is right handed.     No Known Allergies   Current outpatient prescriptions:  .  Amino Acids-Protein Hydrolys (FEEDING SUPPLEMENT, PRO-STAT SUGAR FREE 64,) LIQD, Take 30 mLs by mouth 3 (three) times daily., Disp: 900 mL, Rfl: 0 .  amoxicillin (AMOXIL) 500 MG capsule, Take 1 capsule (500 mg total) by mouth 3 (three) times daily., Disp: 90 capsule, Rfl: 11 .  baclofen (LIORESAL) 20 MG tablet, Take 1 tablet (20 mg total) by mouth at bedtime., Disp: 30 tablet, Rfl: 3 .  bethanechol (URECHOLINE) 50 MG tablet, Take 1 tablet by mouth 2 (two) times daily. , Disp: , Rfl:  .  fluconazole (DIFLUCAN) 100 MG tablet, Take 1 tablet by mouth daily., Disp: , Rfl:  .  FLUoxetine (PROZAC) 10 MG tablet, Take 10 mg by mouth daily., Disp: , Rfl:  .  fluticasone (FLONASE) 50 MCG/ACT nasal spray, Place 1 spray into both nostrils daily., Disp: , Rfl:  .  gabapentin (NEURONTIN) 100 MG capsule, Take 100 mg by mouth 3 (three) times daily. , Disp: , Rfl:  .  Insulin Degludec (TRESIBA FLEXTOUCH) 200 UNIT/ML SOPN, Inject 20 Units into the skin daily., Disp: 10 mL, Rfl: 0 .  lactulose (CHRONULAC) 10 GM/15ML solution, Take 45 mLs (30 g total) by mouth 3 (three) times daily., Disp: 240 mL, Rfl: 1 .  lactulose (CHRONULAC) 10 GM/15ML solution, Take 45 mLs (30 g total) by mouth 3 (three) times daily., Disp: 240 mL, Rfl: 2 .  Multiple Vitamin (MULTIVITAMIN WITH MINERALS) TABS tablet, Take 1 tablet by mouth daily., Disp: , Rfl:  .  pantoprazole (PROTONIX) 40 MG tablet, TAKE 1 TABLET (40 MG TOTAL) BY MOUTH DAILY., Disp: 30 tablet, Rfl: 5 .  tamsulosin (FLOMAX) 0.4 MG CAPS capsule, Take 1 capsule by mouth 2 (two) times daily. , Disp: , Rfl:  .  Vitamin D, Ergocalciferol, (DRISDOL) 50000 UNITS CAPS capsule, Take 50,000 Units by mouth every 7  (seven) days. Mondays., Disp: , Rfl: 11 .  XIFAXAN 550 MG TABS tablet, TAKE 1 TABLET (550 MG TOTAL) BY MOUTH 2 (TWO) TIMES DAILY., Disp: 60 tablet, Rfl: 1 No current facility-administered medications for this visit.  Facility-Administered Medications Ordered in Other Visits:  .  albumin human 25 % solution 25 g, 25 g, Intravenous, Once, [employer], PA-C .  lidocaine (PF) (XYLOCAINE) 1 % injection, , , ,      Review of Systems  Constitutional: Negative for fever, chills, diaphoresis, activity change, appetite change, fatigue and unexpected weight change.  HENT: Negative for congestion, rhinorrhea, sinus pressure, sneezing, sore throat and trouble swallowing.   Eyes: Negative for photophobia and visual disturbance.  Respiratory: Negative for cough, chest  tightness, shortness of breath, wheezing and stridor.   Cardiovascular: Negative for chest pain, palpitations and leg swelling.  Gastrointestinal: Positive for diarrhea and abdominal distention. Negative for nausea, vomiting, abdominal pain, constipation, blood in stool and anal bleeding.  Genitourinary: Negative for dysuria, hematuria, flank pain and difficulty urinating.  Musculoskeletal: Negative for myalgias, back pain, joint swelling, arthralgias and gait problem.  Skin: Negative for color change, pallor, rash and wound.  Neurological: Positive for weakness and numbness. Negative for dizziness, tremors and light-headedness.  Hematological: Negative for adenopathy. Does not bruise/bleed easily.  Psychiatric/Behavioral: Positive for confusion. Negative for behavioral problems, sleep disturbance, dysphoric mood, decreased concentration and agitation.       Objective:   Physical Exam  Constitutional: He is oriented to person, place, and time. He appears well-developed and well-nourished.  HENT:  Head: Normocephalic and atraumatic.  Eyes: Conjunctivae and EOM are normal.  Neck: Normal range of motion. Neck supple.  Cardiovascular:  Normal rate, regular rhythm and normal heart sounds.   Pulmonary/Chest: Effort normal and breath sounds normal. No respiratory distress. He has no wheezes.  Abdominal: Soft. He exhibits distension. There is tenderness. There is no rebound.  Musculoskeletal: Normal range of motion. He exhibits edema. He exhibits no tenderness.  Neurological: He is alert and oriented to person, place, and time. No cranial nerve deficit. GCS eye subscore is 4. GCS verbal subscore is 5. GCS motor subscore is 6.  Reflex Scores:      Bicep reflexes are 4+ on the right side and 4+ on the left side.      Patellar reflexes are 4+ on the right side and 4+ on the left side.      Achilles reflexes are 4+ on the right side and 4+ on the left side. Skin: Skin is warm and dry. No rash noted. No erythema.  Psychiatric: He has a normal mood and affect. His behavior is normal. His speech is delayed.          Assessment & Plan:   Large cervical epidural abscess in setting of GBS bacteremia and severe sepsis now with residual persistent MRI changes in disk at C3  --continue high dose oral amoxicillin for a MINIMUM of > 1 year (which would be this April) if not longer  --he is to report to myself and more importantly to Dr. Saintclair Halsted IMMEDIATELY should he have worsening neck pain, or new or worsening neurological ssx that would portend need for urgent Neurosurgical intervention   NASH: followed by Dr [name] and reqiring frequent paracenteses. He has been followed at [facility] for consideration of transplantation. 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

## 2015-08-13 NOTE — Procedures (Signed)
Ultrasound-guided diagnostic and therapeutic paracentesis performed yielding 3 liters of clear yellow colored fluid. No immediate complications.  [name] E 11:34 AM 08/13/2015

## 2015-08-14 LAB — PROTIME-INR
INR: 1.72 — ABNORMAL HIGH (ref <1.50)
Prothrombin Time: 20.4 seconds — ABNORMAL HIGH (ref 11.6–15.2)

## 2015-08-14 LAB — AMMONIA: Ammonia: 80 umol/L — ABNORMAL HIGH (ref 16–53)

## 2015-08-14 LAB — PATHOLOGIST SMEAR REVIEW

## 2015-08-14 MED ORDER — LACTULOSE 10 GM/15ML PO SOLN: 30.0000 g | Freq: Three times a day (TID) | ORAL | Status: DC

## 2015-08-14 NOTE — Telephone Encounter (Signed)
Spoke with Christella Scheuermann to find out if they shipped the Lactulose I ordered 08/10/2015.  That shipped but is only 10 days worth.  I refilled it again at a 90 day supply so she will have plenty going forward.  Communicated this to patient's wife who acknowledged and understood.

## 2015-08-15 ENCOUNTER — Ambulatory Visit: Payer: Managed Care, Other (non HMO) | Admitting: Infectious Disease

## 2015-08-17 ENCOUNTER — Ambulatory Visit: Payer: Managed Care, Other (non HMO) | Admitting: Internal Medicine

## 2015-08-17 ENCOUNTER — Encounter (HOSPITAL_COMMUNITY)
Admission: RE | Admit: 2015-08-17 | Discharge: 2015-08-17 | Disposition: A | Discharge status: Home / Self Care | Payer: Managed Care, Other (non HMO) | Source: Ambulatory Visit | Attending: Internal Medicine | Admitting: Internal Medicine

## 2015-08-17 ENCOUNTER — Ambulatory Visit (HOSPITAL_COMMUNITY)
Admission: RE | Admit: 2015-08-17 | Discharge: 2015-08-17 | Disposition: A | Discharge status: Home / Self Care | Payer: Managed Care, Other (non HMO) | Source: Ambulatory Visit | Attending: Internal Medicine | Admitting: Internal Medicine

## 2015-08-17 DIAGNOSIS — N19 Unspecified kidney failure: Secondary | ICD-10-CM | POA: Diagnosis not present

## 2015-08-17 DIAGNOSIS — R188 Other ascites: Secondary | ICD-10-CM | POA: Diagnosis not present

## 2015-08-17 DIAGNOSIS — K7460 Unspecified cirrhosis of liver: Secondary | ICD-10-CM | POA: Diagnosis not present

## 2015-08-17 MED ORDER — ALBUMIN HUMAN 25 % IV SOLN
25.0000 g | Freq: Once | INTRAVENOUS | Status: AC
  Administered 2015-08-17: 25 g via INTRAVENOUS
  Filled 2015-08-17: qty 100

## 2015-08-17 MED ORDER — LIDOCAINE HCL (PF) 1 % IJ SOLN
INTRAMUSCULAR | Status: AC
  Filled 2015-08-17: qty 10

## 2015-08-17 NOTE — Procedures (Signed)
Ultrasound-guided  therapeutic paracentesis performed yielding 3 liters (maximum ordered) of yellow  fluid. No immediate complications.The pt will receive IV albumin postprocedure.

## 2015-08-20 ENCOUNTER — Ambulatory Visit: Payer: Managed Care, Other (non HMO) | Admitting: Infectious Disease

## 2015-08-20 ENCOUNTER — Emergency Department (HOSPITAL_COMMUNITY)
Admission: EM | Admit: 2015-08-20 | Discharge: 2015-08-20 | Disposition: A | Discharge status: Home / Self Care | Payer: Managed Care, Other (non HMO) | Attending: Emergency Medicine | Admitting: Emergency Medicine

## 2015-08-20 ENCOUNTER — Ambulatory Visit (HOSPITAL_COMMUNITY)
Admission: RE | Admit: 2015-08-20 | Discharge: 2015-08-20 | Disposition: A | Discharge status: Home / Self Care | Payer: Managed Care, Other (non HMO) | Source: Ambulatory Visit | Attending: Internal Medicine | Admitting: Internal Medicine

## 2015-08-20 ENCOUNTER — Telehealth: Payer: Self-pay | Admitting: Internal Medicine

## 2015-08-20 ENCOUNTER — Encounter (HOSPITAL_COMMUNITY): Payer: Self-pay | Admitting: *Deleted

## 2015-08-20 DIAGNOSIS — Z8669 Personal history of other diseases of the nervous system and sense organs: Secondary | ICD-10-CM | POA: Insufficient documentation

## 2015-08-20 DIAGNOSIS — Z79899 Other long term (current) drug therapy: Secondary | ICD-10-CM | POA: Insufficient documentation

## 2015-08-20 DIAGNOSIS — R400 Somnolence: Secondary | ICD-10-CM

## 2015-08-20 DIAGNOSIS — N183 Chronic kidney disease, stage 3 (moderate): Secondary | ICD-10-CM | POA: Diagnosis not present

## 2015-08-20 DIAGNOSIS — F329 Major depressive disorder, single episode, unspecified: Secondary | ICD-10-CM | POA: Insufficient documentation

## 2015-08-20 DIAGNOSIS — Z792 Long term (current) use of antibiotics: Secondary | ICD-10-CM | POA: Insufficient documentation

## 2015-08-20 DIAGNOSIS — K7469 Other cirrhosis of liver: Secondary | ICD-10-CM

## 2015-08-20 DIAGNOSIS — N19 Unspecified kidney failure: Secondary | ICD-10-CM | POA: Insufficient documentation

## 2015-08-20 DIAGNOSIS — Z7951 Long term (current) use of inhaled steroids: Secondary | ICD-10-CM | POA: Diagnosis not present

## 2015-08-20 DIAGNOSIS — Z862 Personal history of diseases of the blood and blood-forming organs and certain disorders involving the immune mechanism: Secondary | ICD-10-CM | POA: Insufficient documentation

## 2015-08-20 DIAGNOSIS — K7460 Unspecified cirrhosis of liver: Secondary | ICD-10-CM | POA: Insufficient documentation

## 2015-08-20 DIAGNOSIS — R4182 Altered mental status, unspecified: Secondary | ICD-10-CM | POA: Diagnosis present

## 2015-08-20 DIAGNOSIS — R188 Other ascites: Secondary | ICD-10-CM | POA: Insufficient documentation

## 2015-08-20 DIAGNOSIS — Z8639 Personal history of other endocrine, nutritional and metabolic disease: Secondary | ICD-10-CM | POA: Diagnosis not present

## 2015-08-20 DIAGNOSIS — Z794 Long term (current) use of insulin: Secondary | ICD-10-CM | POA: Diagnosis not present

## 2015-08-20 DIAGNOSIS — Z8679 Personal history of other diseases of the circulatory system: Secondary | ICD-10-CM | POA: Insufficient documentation

## 2015-08-20 DIAGNOSIS — R630 Anorexia: Secondary | ICD-10-CM

## 2015-08-20 LAB — BASIC METABOLIC PANEL
Anion gap: 14 (ref 5–15)
BUN: 50 mg/dL — AB (ref 6–20)
CHLORIDE: 102 mmol/L (ref 101–111)
CO2: 16 mmol/L — ABNORMAL LOW (ref 22–32)
CREATININE: 2.47 mg/dL — AB (ref 0.61–1.24)
Calcium: 9.7 mg/dL (ref 8.9–10.3)
GFR calc Af Amer: 31 mL/min — ABNORMAL LOW (ref >60)
GFR calc non Af Amer: 26 mL/min — ABNORMAL LOW (ref >60)
GLUCOSE: 191 mg/dL — AB (ref 65–99)
Potassium: 5 mmol/L (ref 3.5–5.1)
SODIUM: 132 mmol/L — AB (ref 135–145)

## 2015-08-20 LAB — CBC
HEMATOCRIT: 29.2 % — AB (ref 39.0–52.0)
Hemoglobin: 10.3 g/dL — ABNORMAL LOW (ref 13.0–17.0)
MCH: 31.1 pg (ref 26.0–34.0)
MCHC: 35.3 g/dL (ref 30.0–36.0)
MCV: 88.2 fL (ref 78.0–100.0)
PLATELETS: 24 10*3/uL — AB (ref 150–400)
RBC: 3.31 MIL/uL — ABNORMAL LOW (ref 4.22–5.81)
RDW: 15.8 % — AB (ref 11.5–15.5)
WBC: 3.2 10*3/uL — AB (ref 4.0–10.5)

## 2015-08-20 LAB — BODY FLUID CELL COUNT WITH DIFFERENTIAL
Eos, Fluid: 0 %
LYMPHS FL: 21 %
MONOCYTE-MACROPHAGE-SEROUS FLUID: 76 % (ref 50–90)
NEUTROPHIL FLUID: 3 % (ref 0–25)
Other Cells, Fluid: 0 %
WBC FLUID: 78 uL (ref 0–1000)

## 2015-08-20 LAB — AMMONIA: Ammonia: 44 umol/L — ABNORMAL HIGH (ref 9–35)

## 2015-08-20 LAB — CBG MONITORING, ED: Glucose-Capillary: 150 mg/dL — ABNORMAL HIGH (ref 65–99)

## 2015-08-20 MED ORDER — LIDOCAINE HCL (PF) 1 % IJ SOLN
INTRAMUSCULAR | Status: AC
  Filled 2015-08-20: qty 10

## 2015-08-20 MED ORDER — ALBUMIN HUMAN 25 % IV SOLN
25.0000 g | Freq: Once | INTRAVENOUS | Status: AC
  Administered 2015-08-20: 25 g via INTRAVENOUS
  Filled 2015-08-20: qty 100

## 2015-08-20 NOTE — Telephone Encounter (Signed)
Pt aware.

## 2015-08-20 NOTE — Telephone Encounter (Signed)
Pt scheduled to see Lori Hvozdovic, PA-C 08/22/15@2 :45pm.

## 2015-08-20 NOTE — Procedures (Signed)
Ultrasound-guided diagnostic and therapeutic paracentesis performed yielding 3 liters of clear yellow colored fluid. No immediate complications. 25g of albumin given in short stay per order.  [name] E 2:59 PM 08/20/2015

## 2015-08-20 NOTE — Telephone Encounter (Signed)
Pt went to ER today. Pt was very uncomfortable and confused. Pt had paracentesis done and was told to call for OV with GI this week. No appts available this week. Pt had labs done in ER today also. Please advise.

## 2015-08-20 NOTE — ED Notes (Signed)
Critical Lab Value:  Platelet Count 24  Dr. Johnney Killian made aware

## 2015-08-20 NOTE — ED Notes (Signed)
CBG: 150 

## 2015-08-20 NOTE — Discharge Instructions (Signed)
Cirrhosis °Cirrhosis is long-term (chronic) liver injury. The liver is your largest internal organ, and it performs many functions. The liver converts food into energy, removes toxic material from your blood, makes important proteins, and absorbs necessary vitamins from your diet. °If you have cirrhosis, it means many of your healthy liver cells have been replaced by scar tissue. This prevents blood from flowing through your liver, which makes it difficult for your liver to function. This scarring is not reversible, but treatment can prevent it from getting worse.  °CAUSES  °Hepatitis C and long-term alcohol abuse are the most common causes of cirrhosis. Other causes include: °· Nonalcoholic fatty liver disease. °· Hepatitis B infection. °· Autoimmune hepatitis. °· Diseases that cause blockage of ducts inside the liver. °· Inherited liver diseases. °· Reactions to certain long-term medicines. °· Parasitic infections. °· Long-term exposure to certain toxins. °RISK FACTORS °You may have a higher risk of cirrhosis if you: °· Have certain hepatitis viruses. °· Abuse alcohol, especially if you are male. °· Are overweight. °· Share needles. °· Have unprotected sex with someone who has hepatitis. °SYMPTOMS  °You may not have any signs and symptoms at first. Symptoms may not develop until the damage to your liver starts to get worse. Signs and symptoms of cirrhosis may include:  °· Tenderness in the right-upper part of your abdomen. °· Weakness and tiredness (fatigue). °· Loss of appetite. °· Nausea. °· Weight loss and muscle loss. °· Itchiness. °· Yellow skin and eyes (jaundice). °· Buildup of fluid in the abdomen (ascites). °· Swelling of the feet and ankles (edema). °· Appearance of tiny blood vessels under the skin. °· Mental confusion. °· Easy bruising and bleeding. °DIAGNOSIS  °Your health care provider may suspect cirrhosis based on your symptoms and medical history, especially if you have other medical conditions  or a history of alcohol abuse. Your health care provider will do a physical exam to feel your liver and check for signs of cirrhosis. Your health care provider may perform other tests, including:  °· Blood tests to check:   °¨ Whether you have hepatitis B or C.   °¨ Kidney function. °¨ Liver function. °· Imaging tests such as: °¨ MRI or CT scan to look for changes seen in advanced cirrhosis. °¨ Ultrasound to see if normal liver tissue is being replaced by scar tissue. °· A procedure using a long needle to take a sample of liver tissue (biopsy) for examination under a microscope. Liver biopsy can confirm the diagnosis of cirrhosis.   °TREATMENT  °Treatment depends on how damaged your liver is and what caused the damage. Treatment may include treating cirrhosis symptoms or treating the underlying causes of the condition to try to slow the progression of the damage. Treatment may include: °· Making lifestyle changes, such as:   °¨ Eating a healthy diet. °¨ Restricting salt intake.  °¨ Maintaining a healthy weight.   °¨ Not abusing drugs or alcohol. °· Taking medicines to: °¨ Treat liver infections or other infections. °¨ Control itching. °¨ Reduce fluid buildup. °¨ Reduce certain blood toxins. °¨ Reduce risk of bleeding from enlarged blood vessels in the stomach or esophagus (varices). °· If varices are causing bleeding problems, you may need treatment with a procedure that ties up the vessels causing them to fall off (band ligation). °· If cirrhosis is causing your liver to fail, your health care provider may recommend a liver transplant. °· Other treatments may be recommended depending on any complications of cirrhosis, such as liver-related kidney failure (hepatorenal   syndrome). °HOME CARE INSTRUCTIONS  °· Take medicines only as directed by your health care provider. Do not use drugs that are toxic to your liver. Ask your health care provider before taking any new medicines, including over-the-counter medicines.    °· Rest as needed. °· Eat a well-balanced diet. Ask your health care provider or dietitian for more information.   °· You may have to follow a low-salt diet or restrict your water intake as directed. °· Do not drink alcohol. This is especially important if you are taking acetaminophen. °· Keep all follow-up visits as directed by your health care provider. This is important. °SEEK MEDICAL CARE IF: °· You have fatigue or weakness that is getting worse. °· You develop swelling of the hands, feet, legs, or face. °· You have a fever. °· You develop loss of appetite. °· You have nausea or vomiting. °· You develop jaundice. °· You develop easy bruising or bleeding. °SEEK IMMEDIATE MEDICAL CARE IF: °· You vomit bright red blood or a material that looks like coffee grounds. °· You have blood in your stools. °· Your stools appear black and tarry. °· You become confused. °· You have chest pain or trouble breathing. °  °This information is not intended to replace advice given to you by your health care provider. Make sure you discuss any questions you have with your health care provider. °  °Document Released: 06/23/2005 Document Revised: 07/14/2014 Document Reviewed: 03/01/2014 °Elsevier Interactive Patient Education ©2016 [employer] ° °

## 2015-08-20 NOTE — Telephone Encounter (Signed)
Left message for pt to call back  °

## 2015-08-20 NOTE — ED Notes (Signed)
Pt reports feeling generalized weakness and confusion for 3 days. Pt states that he has had lower blood presser as well. Pt noted to have abdominal swelling. Pt reports having multiple paracentesis.

## 2015-08-20 NOTE — ED Provider Notes (Signed)
CSN: [id number]     Arrival date & time 08/20/15  0709 History   First MD Initiated Contact with Patient 08/20/15 [phone]     Chief Complaint  Patient presents with  . Altered Mental Status  . Fatigue     (Consider location/radiation/quality/duration/timing/severity/associated sxs/prior Treatment) HPI Patient has a complex medical history of cirrhosis and other comorbidities. He gets routine paracentesis. Patient is scheduled for paracentesis at 2 PM today. His wife reports that he has been more drowsy than typical and also eating less this week. She does report that he has severe and chronic liver disease which has been progressing over the past weeks and months. He has not had a fever. Patient denies localizing pain. Predominant symptoms or presentation to the emergency department are increasing drowsiness and decreased by mouth intake. Patient has known significant thrombocytopenia. The patient's wife reports that he stubbed his fourth toe on the left foot about 3 days ago. She reports as been black and blue and she would like it looked at. He bruises very easily. Past Medical History  Diagnosis Date  . Ascites   . Acute hepatic encephalopathy ([place])   . Acute renal failure superimposed on stage 3 chronic kidney disease ([place])   . Anemia of chronic disease   . Bleeding esophageal varices ([place])   . Coagulopathy ([place])   . Diabetes mellitus type II, controlled ([place])   . Depression   . Diabetic neuropathy ([place])   . Esophageal stricture   . NASH (nonalcoholic steatohepatitis)   . Protein-calorie malnutrition, severe ([place])   . Secondary biliary cirrhosis ([place])   . Thrombocytopenia ([place])   . 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 Kidney stone surgery  yrs ago  . Esophagogastroduodenoscopy  08/01/2011    Procedure: ESOPHAGOGASTRODUODENOSCOPY (EGD);  Surgeon: Scarlette Shorts, MD;  Location: [facility];  Service: Endoscopy;   Laterality: N/A;  . Esophagogastroduodenoscopy N/A 10/27/2012    Procedure: ESOPHAGOGASTRODUODENOSCOPY (EGD);  Surgeon: Inda Castle, MD;  Location: [facility];  Service: Endoscopy;  Laterality: N/A;  . Esophagogastroduodenoscopy N/A 11/22/2012    Procedure: ESOPHAGOGASTRODUODENOSCOPY (EGD);  Surgeon: Irene Shipper, MD;  Location: [facility];  Service: Endoscopy;  Laterality: N/A;  . Colonoscopy    . Upper gastrointestinal endoscopy    . Esophagogastroduodenoscopy N/A 11/29/2013    Procedure: ESOPHAGOGASTRODUODENOSCOPY (EGD);  Surgeon: Irene Shipper, MD;  Location: [facility];  Service: Endoscopy;  Laterality: N/A;  . Esophageal banding Bilateral 11/29/2013    Procedure: ESOPHAGEAL BANDING;  Surgeon: Irene Shipper, MD;  Location: [facility];  Service: Endoscopy;  Laterality: Bilateral;  . Esophagogastroduodenoscopy N/A 06/13/2014    Procedure: ESOPHAGOGASTRODUODENOSCOPY (EGD);  Surgeon: Irene Shipper, MD;  Location: [facility];  Service: Endoscopy;  Laterality: N/A;  . Esophageal banding N/A 06/13/2014    Procedure: ESOPHAGEAL BANDING;  Surgeon: Irene Shipper, MD;  Location: [facility];  Service: Endoscopy;  Laterality: N/A;   Family History  Problem Relation Age of Onset  . Colon cancer Neg Hx   . Anesthesia problems Neg Hx   . Hypotension Neg Hx   . Malignant hyperthermia Neg Hx   . Pseudochol deficiency Neg Hx   . Diabetes Father   . Heart disease Father   . Aneurysm Mother   . Heart disease Other     neice  . Diabetes      niece   Social History  Substance Use Topics  . Smoking status:  Never Smoker   . Smokeless tobacco: Never Used  . Alcohol Use: No     Comment: very rare    Review of Systems 10 Systems reviewed and are negative for acute change except as noted in the HPI.    Allergies  Review of patient's allergies indicates no known allergies.  Home Medications   Prior to Admission medications   Medication Sig Start Date End Date Taking? Authorizing  Provider  Amino Acids-Protein Hydrolys (FEEDING SUPPLEMENT, PRO-STAT SUGAR FREE 64,) LIQD Take 30 mLs by mouth 3 (three) times daily. 07/31/15  Yes Belkys A Regalado, MD  amoxicillin (AMOXIL) 500 MG capsule Take 1 capsule (500 mg total) by mouth 3 (three) times daily. 08/13/15  Yes Truman Hayward, MD  baclofen (LIORESAL) 20 MG tablet Take 1 tablet (20 mg total) by mouth at bedtime. 06/11/15  Yes Meredith Staggers, MD  bethanechol (URECHOLINE) 50 MG tablet Take 1 tablet by mouth 2 (two) times daily.  12/19/14  Yes Historical Provider, MD  fluconazole (DIFLUCAN) 100 MG tablet Take 1 tablet by mouth daily. 07/19/15  Yes Historical Provider, MD  FLUoxetine (PROZAC) 10 MG tablet Take 10 mg by mouth daily.   Yes Historical Provider, MD  fluticasone (FLONASE) 50 MCG/ACT nasal spray Place 1 spray into both nostrils daily. 08/02/15  Yes Historical Provider, MD  gabapentin (NEURONTIN) 100 MG capsule Take 100 mg by mouth 3 (three) times daily.    Yes Historical Provider, MD  Insulin Degludec (TRESIBA FLEXTOUCH) 200 UNIT/ML SOPN Inject 20 Units into the skin daily. Patient taking differently: Inject 24 Units into the skin daily.  07/31/15  Yes Belkys A Regalado, MD  lactulose (CHRONULAC) 10 GM/15ML solution Take 45 mLs (30 g total) by mouth 3 (three) times daily. 08/09/15  Yes Irene Shipper, MD  lactulose (CHRONULAC) 10 GM/15ML solution Take 45 mLs (30 g total) by mouth 3 (three) times daily. 08/14/15  Yes Irene Shipper, MD  Multiple Vitamin (MULTIVITAMIN WITH MINERALS) TABS tablet Take 1 tablet by mouth daily.   Yes Historical Provider, MD  pantoprazole (PROTONIX) 40 MG tablet TAKE 1 TABLET (40 MG TOTAL) BY MOUTH DAILY. 11/13/14  Yes Irene Shipper, MD  tamsulosin (FLOMAX) 0.4 MG CAPS capsule Take 1 capsule by mouth 2 (two) times daily.  12/19/14  Yes Historical Provider, MD  XIFAXAN 550 MG TABS tablet TAKE 1 TABLET (550 MG TOTAL) BY MOUTH 2 (TWO) TIMES DAILY. 06/27/15  Yes Irene Shipper, MD  Vitamin D, Ergocalciferol,  (DRISDOL) 50000 UNITS CAPS capsule Take 50,000 Units by mouth every 7 (seven) days. Mondays. 03/29/15   Historical Provider, MD   BP 115/66 mmHg  Pulse 72  Temp(Src) 97.7 F (36.5 C) (Oral)  Resp 10  SpO2 98% Physical Exam  Constitutional:  A chronically ill, fatigued appearing gentleman. He is sleeping but awakens to light stimulus. He is oriented and answers simple questions appropriately when awake.  HENT:  Head: Normocephalic and atraumatic.  Nose: Nose normal.  Mouth/Throat: Oropharynx is clear and moist.  Eyes: EOM are normal. Pupils are equal, round, and reactive to light. No scleral icterus.  Neck: Neck supple.  Cardiovascular: Normal rate, regular rhythm, normal heart sounds and intact distal pulses.   Pulmonary/Chest: Effort normal and breath sounds normal.  Abdominal:  Abdomen is very distended and taut. No significant pain to palpation. Large ascites.  Musculoskeletal:  Minimal peripheral edema. Patient has ecchymosis of the fourth toe on the left foot. There does not appear to be  any associated cellulitis.  Neurological:  Patient is somnolent but awakens to light stimulus. He is generally quite weak but has use of 4 extremities purposely.  Skin: Skin is warm and dry.  Skin has multiple areas of petechiae and ecchymoses.    ED Course  Procedures (including critical care time) Labs Review Labs Reviewed  BASIC METABOLIC PANEL - Abnormal; Notable for the following:    Sodium 132 (*)    CO2 16 (*)    Glucose, Bld 191 (*)    BUN 50 (*)    Creatinine, Ser 2.47 (*)    GFR calc non Af Amer 26 (*)    GFR calc Af Amer 31 (*)    All other components within normal limits  CBC - Abnormal; Notable for the following:    WBC 3.2 (*)    RBC 3.31 (*)    Hemoglobin 10.3 (*)    HCT 29.2 (*)    RDW 15.8 (*)    Platelets 24 (*)    All other components within normal limits  AMMONIA - Abnormal; Notable for the following:    Ammonia 44 (*)    All other components within normal  limits  CBG MONITORING, ED - Abnormal; Notable for the following:    Glucose-Capillary 150 (*)    All other components within normal limits    Imaging Review No results found. I have personally reviewed and evaluated these images and lab results as part of my medical decision-making.   EKG Interpretation None      MDM   Final diagnoses:  Somnolence  Anorexia  Other cirrhosis of liver (HCC)   Patient's diagnostic findings are stable. He does not have worsening anemia or significant elevated ammonia level. Patient awakens and converses normally. He does not show signs of acute encephalopathy. He has nontoxic appearance. He does have chronic disease with significant ascites, atrophy and deconditioning. At this time I do feel is appropriate to pursue the paracentesis which is scheduled for 2 PM today. The patient is advised to follow-up with his family physician this week.    Charlesetta Shanks, MD 08/20/15 1150

## 2015-08-21 LAB — PATHOLOGIST SMEAR REVIEW

## 2015-08-22 ENCOUNTER — Ambulatory Visit (INDEPENDENT_AMBULATORY_CARE_PROVIDER_SITE_OTHER)
Admission: RE | Admit: 2015-08-22 | Discharge: 2015-08-22 | Disposition: A | Discharge status: Home / Self Care | Payer: Managed Care, Other (non HMO) | Source: Ambulatory Visit | Attending: Physician Assistant | Admitting: Physician Assistant

## 2015-08-22 ENCOUNTER — Ambulatory Visit (INDEPENDENT_AMBULATORY_CARE_PROVIDER_SITE_OTHER): Payer: Managed Care, Other (non HMO) | Admitting: Physician Assistant

## 2015-08-22 ENCOUNTER — Encounter: Payer: Self-pay | Admitting: Physician Assistant

## 2015-08-22 ENCOUNTER — Other Ambulatory Visit (INDEPENDENT_AMBULATORY_CARE_PROVIDER_SITE_OTHER): Payer: Managed Care, Other (non HMO)

## 2015-08-22 VITALS — BP 108/70 | HR 68 | Temp 97.8°F | Ht 70.0 in | Wt 193.4 lb

## 2015-08-22 DIAGNOSIS — K7460 Unspecified cirrhosis of liver: Secondary | ICD-10-CM

## 2015-08-22 DIAGNOSIS — R059 Cough, unspecified: Secondary | ICD-10-CM

## 2015-08-22 DIAGNOSIS — R300 Dysuria: Secondary | ICD-10-CM

## 2015-08-22 DIAGNOSIS — R05 Cough: Secondary | ICD-10-CM

## 2015-08-22 DIAGNOSIS — R6883 Chills (without fever): Secondary | ICD-10-CM

## 2015-08-22 DIAGNOSIS — N289 Disorder of kidney and ureter, unspecified: Secondary | ICD-10-CM

## 2015-08-22 DIAGNOSIS — R188 Other ascites: Secondary | ICD-10-CM

## 2015-08-22 LAB — CBC WITH DIFFERENTIAL/PLATELET
Basophils Absolute: 0 10*3/uL (ref 0.0–0.1)
Basophils Relative: 0.1 % (ref 0.0–3.0)
Eosinophils Absolute: 0.2 10*3/uL (ref 0.0–0.7)
Eosinophils Relative: 6.1 % — ABNORMAL HIGH (ref 0.0–5.0)
HEMATOCRIT: 28.5 % — AB (ref 39.0–52.0)
HEMOGLOBIN: 9.8 g/dL — AB (ref 13.0–17.0)
LYMPHS PCT: 7.7 % — AB (ref 12.0–46.0)
Lymphs Abs: 0.2 10*3/uL — ABNORMAL LOW (ref 0.7–4.0)
MCHC: 34.5 g/dL (ref 30.0–36.0)
MCV: 88.9 fl (ref 78.0–100.0)
MONOS PCT: 14.9 % — AB (ref 3.0–12.0)
Monocytes Absolute: 0.4 10*3/uL (ref 0.1–1.0)
Neutro Abs: 2 10*3/uL (ref 1.4–7.7)
Neutrophils Relative %: 71.2 % (ref 43.0–77.0)
Platelets: 23 10*3/uL — CL (ref 150.0–400.0)
RBC: 3.2 Mil/uL — ABNORMAL LOW (ref 4.22–5.81)
RDW: 16 % — AB (ref 11.5–15.5)
WBC: 2.8 10*3/uL — AB (ref 4.0–10.5)

## 2015-08-22 LAB — AMMONIA: Ammonia: 117 umol/L — ABNORMAL HIGH (ref 11–35)

## 2015-08-22 NOTE — Progress Notes (Signed)
Patient ID: [name], male   DOB: Mar 25, 1951, 65 y.o.   MRN: [id number]     History of Present Illness: [name] is a 65 year old male who was well known to Dr. Jenny Reichmann.. He has numerous medical problems and has been followed in this office for hepatic cirrhosis secondary to [facility], adenomatous colon polyps, GERD. His cirrhosis has been complicated with ascites, encephalopathy, and variceal bleeding. He has been evaluated at the [facility] liver clinic for possible transplantation but was felt not to be a candidate currently due to problems with cervical epidural abscess with transient tetraplegia. He was last seen here on February 2 at which time he was instructed to continue with lower volume paracentesis twice per week. He will have 3 L drawn each time with albumin replacement. His diuretics have been on hold and he has been instructed to continue Xifaxan and lactulose. He was seen in the emergency room 2 days ago for fatigue and advised to keep his appointment for his paracentesis which he did. He felt better after he had his fluid drawn off he is scheduled for another paracentesis tomorrow. He repeatedly asks if he can have the volume of his paracentesis increased for 5 L. We explained to him that due to his kidneys we will not increase past 3 L at this time. He reports that he has been using Xifaxan and lactulose feels periodically confused. He states his appetite has been good. He does report that he has been having some hesitancy and dysuria for the past several days and had chills last night. He has also had a cough for 4 days productive of dark yellow sputum.   Past Medical History  Diagnosis Date  . Ascites   . Acute hepatic encephalopathy ([place])   . Acute renal failure superimposed on stage 3 chronic kidney disease ([place])   . Anemia of chronic disease   . Bleeding esophageal varices ([place])   . Coagulopathy ([place])   . Diabetes mellitus type II, controlled ([place])   . Depression   . Diabetic  neuropathy ([place])   . Esophageal stricture   . NASH (nonalcoholic steatohepatitis)   . Protein-calorie malnutrition, severe ([place])   . Secondary biliary cirrhosis ([place])   . Thrombocytopenia ([place])   . Abscess in epidural space of cervical spine 08/13/2015    Past Surgical History  Procedure Laterality Date  . Kidney stone surgery  yrs ago  . Esophagogastroduodenoscopy  08/01/2011    Procedure: ESOPHAGOGASTRODUODENOSCOPY (EGD);  Surgeon: Scarlette Shorts, MD;  Location: [facility];  Service: Endoscopy;  Laterality: N/A;  . Esophagogastroduodenoscopy N/A 10/27/2012    Procedure: ESOPHAGOGASTRODUODENOSCOPY (EGD);  Surgeon: Inda Castle, MD;  Location: [facility];  Service: Endoscopy;  Laterality: N/A;  . Esophagogastroduodenoscopy N/A 11/22/2012    Procedure: ESOPHAGOGASTRODUODENOSCOPY (EGD);  Surgeon: Irene Shipper, MD;  Location: [facility];  Service: Endoscopy;  Laterality: N/A;  . Colonoscopy    . Upper gastrointestinal endoscopy    . Esophagogastroduodenoscopy N/A 11/29/2013    Procedure: ESOPHAGOGASTRODUODENOSCOPY (EGD);  Surgeon: Irene Shipper, MD;  Location: [facility];  Service: Endoscopy;  Laterality: N/A;  . Esophageal banding Bilateral 11/29/2013    Procedure: ESOPHAGEAL BANDING;  Surgeon: Irene Shipper, MD;  Location: [facility];  Service: Endoscopy;  Laterality: Bilateral;  . Esophagogastroduodenoscopy N/A 06/13/2014    Procedure: ESOPHAGOGASTRODUODENOSCOPY (EGD);  Surgeon: Irene Shipper, MD;  Location: [facility];  Service: Endoscopy;  Laterality: N/A;  . Esophageal banding N/A 06/13/2014    Procedure: ESOPHAGEAL BANDING;  Surgeon: Irene Shipper, MD;  Location: [facility];  Service: Endoscopy;  Laterality: N/A;   Family History  Problem Relation Age of Onset  . Colon cancer Neg Hx   . Anesthesia problems Neg Hx   . Hypotension Neg Hx   . Malignant hyperthermia Neg Hx   . Pseudochol deficiency Neg Hx   . Diabetes Father   . Heart disease Father   . Aneurysm Mother   .  Heart disease Other     neice  . Diabetes      niece   Social History  Substance Use Topics  . Smoking status: Never Smoker   . Smokeless tobacco: Never Used  . Alcohol Use: No     Comment: very rare   Current Outpatient Prescriptions  Medication Sig Dispense Refill  . Amino Acids-Protein Hydrolys (FEEDING SUPPLEMENT, PRO-STAT SUGAR FREE 64,) LIQD Take 30 mLs by mouth 3 (three) times daily. 900 mL 0  . amoxicillin (AMOXIL) 500 MG capsule Take 1 capsule (500 mg total) by mouth 3 (three) times daily. 90 capsule 11  . baclofen (LIORESAL) 20 MG tablet Take 1 tablet (20 mg total) by mouth at bedtime. 30 tablet 3  . bethanechol (URECHOLINE) 50 MG tablet Take 1 tablet by mouth 2 (two) times daily.     . fluconazole (DIFLUCAN) 100 MG tablet Take 1 tablet by mouth daily.    Marland Kitchen FLUoxetine (PROZAC) 10 MG tablet Take 10 mg by mouth daily.    . fluticasone (FLONASE) 50 MCG/ACT nasal spray Place 1 spray into both nostrils daily.    Marland Kitchen gabapentin (NEURONTIN) 100 MG capsule Take 100 mg by mouth 3 (three) times daily.     . Insulin Degludec (TRESIBA FLEXTOUCH) 200 UNIT/ML SOPN Inject 20 Units into the skin daily. (Patient taking differently: Inject 24 Units into the skin daily. ) 10 mL 0  . lactulose (CHRONULAC) 10 GM/15ML solution Take 45 mLs (30 g total) by mouth 3 (three) times daily. 240 mL 1  . lactulose (CHRONULAC) 10 GM/15ML solution Take 45 mLs (30 g total) by mouth 3 (three) times daily. 12000 mL 3  . Multiple Vitamin (MULTIVITAMIN WITH MINERALS) TABS tablet Take 1 tablet by mouth daily.    . pantoprazole (PROTONIX) 40 MG tablet TAKE 1 TABLET (40 MG TOTAL) BY MOUTH DAILY. 30 tablet 5  . tamsulosin (FLOMAX) 0.4 MG CAPS capsule Take 1 capsule by mouth 2 (two) times daily.     . Vitamin D, Ergocalciferol, (DRISDOL) 50000 UNITS CAPS capsule Take 50,000 Units by mouth every 7 (seven) days. Mondays.  11  . XIFAXAN 550 MG TABS tablet TAKE 1 TABLET (550 MG TOTAL) BY MOUTH 2 (TWO) TIMES DAILY. 60 tablet 1     No current facility-administered medications for this visit.   No Known Allergies   Review of Systems: Per history of present illness otherwise negative.  LAB RESULTS:  Recent Labs  08/20/15 0727  WBC 3.2*  HGB 10.3*  HCT 29.2*  PLT 24*   BMET  Recent Labs  08/20/15 0727  NA 132*  K 5.0  CL 102  CO2 16*  GLUCOSE 191*  BUN 50*  CREATININE 2.47*  CALCIUM 9.7     Physical Exam: BP 108/70 mmHg  Pulse 68  Temp(Src) 97.8 F (36.6 C)  Ht 5\' 10"  (1.778 m)  Wt 193 lb 6.4 oz (87.726 kg)  BMI 27.75 kg/m2 Constitutional: Chronically ill-appearing, no acute distress Psychiatric: alert and oriented x3, cooperative Eyes: extraocular movements intact, anicteric, conjunctiva pink  Mouth: oral pharynx moist, no lesions Neck: supple no lymphadenopathy Cardiovascular: heart regular rate and rhythm, no murmur Lungs: clear to auscultation bilaterally Abdomen: soft, nontender, distended with obvious non-tense ascites, no peritoneal signs, normal bowel sounds, no organomegaly Rectal: Omitted Extremities: 1+ lower extremity edema bilaterally. No clubbing or cyanosis Skin: no lesions on visible extremities Neuro: No focal deficits. No asterixis.  Assessment and Recommendations: 65 year old male with [name] cirrhosis with refractory ascites and portal hypertension as well as renal insufficiency. He has recurrent hepatic encephalopathy and has had variceal bleeds. At this point he will continue with low volume paracentesis twice per week with 3 L each drawl with albumin replacement. Fluids are to be sent for cell count with differential. He will continue to hold diuretics. He will continue Xifaxan and lactulose. He has an appointment at [facility] on March 15 and we'll keep this appointment. Due to his complaints of dysuria, we will check a urinalysis and a urine culture and sensitivity today. A chest x-ray will be obtained as well due to his complaints of productive cough. We will check a CBC  and ammonia today as well. Patient has been scheduled for follow-up in 3 months from the time of his last visit with [name], however the patient wife states she would like to keep closer follow-up and he will be given a follow-up in 4 or 5 weeks.        Santi Troung, Deloris Ping 08/22/2015,

## 2015-08-23 ENCOUNTER — Other Ambulatory Visit: Payer: Self-pay | Admitting: *Deleted

## 2015-08-23 ENCOUNTER — Ambulatory Visit (HOSPITAL_COMMUNITY)
Admission: RE | Admit: 2015-08-23 | Discharge: 2015-08-23 | Disposition: A | Discharge status: Home / Self Care | Payer: Managed Care, Other (non HMO) | Source: Ambulatory Visit | Attending: Internal Medicine | Admitting: Internal Medicine

## 2015-08-23 DIAGNOSIS — K7581 Nonalcoholic steatohepatitis (NASH): Secondary | ICD-10-CM | POA: Insufficient documentation

## 2015-08-23 DIAGNOSIS — N189 Chronic kidney disease, unspecified: Secondary | ICD-10-CM | POA: Insufficient documentation

## 2015-08-23 DIAGNOSIS — K7460 Unspecified cirrhosis of liver: Secondary | ICD-10-CM

## 2015-08-23 DIAGNOSIS — K7290 Hepatic failure, unspecified without coma: Secondary | ICD-10-CM | POA: Diagnosis not present

## 2015-08-23 DIAGNOSIS — R188 Other ascites: Secondary | ICD-10-CM | POA: Diagnosis not present

## 2015-08-23 DIAGNOSIS — G9340 Encephalopathy, unspecified: Secondary | ICD-10-CM | POA: Diagnosis not present

## 2015-08-23 LAB — BODY FLUID CELL COUNT WITH DIFFERENTIAL
Eos, Fluid: 0 %
LYMPHS FL: 19 %
Monocyte-Macrophage-Serous Fluid: 74 % (ref 50–90)
NEUTROPHIL FLUID: 7 % (ref 0–25)
WBC FLUID: 66 uL (ref 0–1000)

## 2015-08-23 LAB — URINALYSIS
BILIRUBIN URINE: NEGATIVE
Hgb urine dipstick: NEGATIVE
LEUKOCYTES UA: NEGATIVE
NITRITE: NEGATIVE
PH: 6 (ref 5.0–8.0)
SPECIFIC GRAVITY, URINE: 1.01 (ref 1.000–1.030)
Total Protein, Urine: NEGATIVE
Urine Glucose: NEGATIVE
Urobilinogen, UA: 0.2 (ref 0.0–1.0)

## 2015-08-23 MED ORDER — ALBUMIN HUMAN 25 % IV SOLN
25.0000 g | Freq: Once | INTRAVENOUS | Status: AC
  Administered 2015-08-23: 25 g via INTRAVENOUS
  Filled 2015-08-23: qty 100

## 2015-08-23 NOTE — Progress Notes (Signed)
Pt here for albumin, arrived by w/c, very lethargic. IV started and albumin administered. No problems during administration.

## 2015-08-23 NOTE — Progress Notes (Signed)
Agree with assessment and plans as outlined. Severe liver disease. Their chronic suboptimal insight into his disease continues to complicate management

## 2015-08-24 LAB — PATHOLOGIST SMEAR REVIEW

## 2015-08-24 LAB — URINE CULTURE
COLONY COUNT: NO GROWTH
ORGANISM ID, BACTERIA: NO GROWTH

## 2015-08-27 ENCOUNTER — Emergency Department (HOSPITAL_COMMUNITY): Payer: Managed Care, Other (non HMO)

## 2015-08-27 ENCOUNTER — Inpatient Hospital Stay (HOSPITAL_COMMUNITY)
Admission: EM | Admit: 2015-08-27 | Discharge: 2015-08-31 | DRG: 441 | Disposition: A | Discharge status: Home / Self Care | Payer: Managed Care, Other (non HMO) | Attending: Internal Medicine | Admitting: Internal Medicine

## 2015-08-27 ENCOUNTER — Other Ambulatory Visit: Payer: Self-pay | Admitting: Internal Medicine

## 2015-08-27 ENCOUNTER — Ambulatory Visit (HOSPITAL_COMMUNITY)
Admission: RE | Admit: 2015-08-27 | Discharge: 2015-08-27 | Disposition: A | Discharge status: Home / Self Care | Payer: Managed Care, Other (non HMO) | Source: Ambulatory Visit | Attending: Internal Medicine | Admitting: Internal Medicine

## 2015-08-27 ENCOUNTER — Encounter (HOSPITAL_COMMUNITY): Payer: Self-pay

## 2015-08-27 DIAGNOSIS — G061 Intraspinal abscess and granuloma: Secondary | ICD-10-CM | POA: Diagnosis present

## 2015-08-27 DIAGNOSIS — F329 Major depressive disorder, single episode, unspecified: Secondary | ICD-10-CM | POA: Diagnosis present

## 2015-08-27 DIAGNOSIS — M4640 Discitis, unspecified, site unspecified: Secondary | ICD-10-CM | POA: Diagnosis present

## 2015-08-27 DIAGNOSIS — D696 Thrombocytopenia, unspecified: Secondary | ICD-10-CM | POA: Diagnosis not present

## 2015-08-27 DIAGNOSIS — E1122 Type 2 diabetes mellitus with diabetic chronic kidney disease: Secondary | ICD-10-CM | POA: Diagnosis present

## 2015-08-27 DIAGNOSIS — N179 Acute kidney failure, unspecified: Secondary | ICD-10-CM | POA: Diagnosis present

## 2015-08-27 DIAGNOSIS — E1121 Type 2 diabetes mellitus with diabetic nephropathy: Secondary | ICD-10-CM | POA: Diagnosis present

## 2015-08-27 DIAGNOSIS — K767 Hepatorenal syndrome: Secondary | ICD-10-CM | POA: Diagnosis present

## 2015-08-27 DIAGNOSIS — N183 Chronic kidney disease, stage 3 unspecified: Secondary | ICD-10-CM | POA: Diagnosis present

## 2015-08-27 DIAGNOSIS — B951 Streptococcus, group B, as the cause of diseases classified elsewhere: Secondary | ICD-10-CM | POA: Diagnosis present

## 2015-08-27 DIAGNOSIS — K7460 Unspecified cirrhosis of liver: Secondary | ICD-10-CM | POA: Diagnosis present

## 2015-08-27 DIAGNOSIS — Z515 Encounter for palliative care: Secondary | ICD-10-CM

## 2015-08-27 DIAGNOSIS — K7200 Acute and subacute hepatic failure without coma: Principal | ICD-10-CM | POA: Diagnosis present

## 2015-08-27 DIAGNOSIS — Z794 Long term (current) use of insulin: Secondary | ICD-10-CM

## 2015-08-27 DIAGNOSIS — K7290 Hepatic failure, unspecified without coma: Secondary | ICD-10-CM | POA: Diagnosis present

## 2015-08-27 DIAGNOSIS — R188 Other ascites: Secondary | ICD-10-CM

## 2015-08-27 DIAGNOSIS — E1165 Type 2 diabetes mellitus with hyperglycemia: Secondary | ICD-10-CM | POA: Diagnosis present

## 2015-08-27 DIAGNOSIS — D638 Anemia in other chronic diseases classified elsewhere: Secondary | ICD-10-CM

## 2015-08-27 DIAGNOSIS — Z792 Long term (current) use of antibiotics: Secondary | ICD-10-CM | POA: Diagnosis not present

## 2015-08-27 DIAGNOSIS — D61818 Other pancytopenia: Secondary | ICD-10-CM | POA: Diagnosis present

## 2015-08-27 DIAGNOSIS — B9689 Other specified bacterial agents as the cause of diseases classified elsewhere: Secondary | ICD-10-CM | POA: Diagnosis present

## 2015-08-27 DIAGNOSIS — E43 Unspecified severe protein-calorie malnutrition: Secondary | ICD-10-CM | POA: Diagnosis present

## 2015-08-27 DIAGNOSIS — G9340 Encephalopathy, unspecified: Secondary | ICD-10-CM | POA: Diagnosis present

## 2015-08-27 DIAGNOSIS — E1140 Type 2 diabetes mellitus with diabetic neuropathy, unspecified: Secondary | ICD-10-CM | POA: Diagnosis present

## 2015-08-27 DIAGNOSIS — E872 Acidosis: Secondary | ICD-10-CM | POA: Diagnosis present

## 2015-08-27 DIAGNOSIS — E119 Type 2 diabetes mellitus without complications: Secondary | ICD-10-CM

## 2015-08-27 DIAGNOSIS — K7682 Hepatic encephalopathy: Secondary | ICD-10-CM | POA: Diagnosis present

## 2015-08-27 LAB — COMPREHENSIVE METABOLIC PANEL
ALT: 28 U/L (ref 17–63)
AST: 51 U/L — AB (ref 15–41)
Albumin: 3.6 g/dL (ref 3.5–5.0)
Alkaline Phosphatase: 89 U/L (ref 38–126)
Anion gap: 8 (ref 5–15)
BUN: 46 mg/dL — AB (ref 6–20)
CHLORIDE: 106 mmol/L (ref 101–111)
CO2: 18 mmol/L — AB (ref 22–32)
CREATININE: 2.16 mg/dL — AB (ref 0.61–1.24)
Calcium: 9.8 mg/dL (ref 8.9–10.3)
GFR calc Af Amer: 36 mL/min — ABNORMAL LOW (ref >60)
GFR, EST NON AFRICAN AMERICAN: 31 mL/min — AB (ref >60)
Glucose, Bld: 294 mg/dL — ABNORMAL HIGH (ref 65–99)
Potassium: 4.5 mmol/L (ref 3.5–5.1)
Sodium: 132 mmol/L — ABNORMAL LOW (ref 135–145)
Total Bilirubin: 1.3 mg/dL — ABNORMAL HIGH (ref 0.3–1.2)
Total Protein: 6.3 g/dL — ABNORMAL LOW (ref 6.5–8.1)

## 2015-08-27 LAB — CBC
HCT: 29.3 % — ABNORMAL LOW (ref 39.0–52.0)
Hemoglobin: 9.9 g/dL — ABNORMAL LOW (ref 13.0–17.0)
MCH: 29.6 pg (ref 26.0–34.0)
MCHC: 33.8 g/dL (ref 30.0–36.0)
MCV: 87.5 fL (ref 78.0–100.0)
PLATELETS: 22 10*3/uL — AB (ref 150–400)
RBC: 3.35 MIL/uL — ABNORMAL LOW (ref 4.22–5.81)
RDW: 15.6 % — AB (ref 11.5–15.5)
WBC: 3.2 10*3/uL — AB (ref 4.0–10.5)

## 2015-08-27 LAB — CBG MONITORING, ED: GLUCOSE-CAPILLARY: 167 mg/dL — AB (ref 65–99)

## 2015-08-27 LAB — URINALYSIS, ROUTINE W REFLEX MICROSCOPIC
BILIRUBIN URINE: NEGATIVE
Glucose, UA: NEGATIVE mg/dL
HGB URINE DIPSTICK: NEGATIVE
Ketones, ur: 15 mg/dL — AB
Leukocytes, UA: NEGATIVE
Nitrite: NEGATIVE
PH: 5.5 (ref 5.0–8.0)
Protein, ur: NEGATIVE mg/dL
SPECIFIC GRAVITY, URINE: 1.022 (ref 1.005–1.030)

## 2015-08-27 LAB — I-STAT CG4 LACTIC ACID, ED
Lactic Acid, Venous: 1.71 mmol/L (ref 0.5–2.0)
Lactic Acid, Venous: 1.69 mmol/L (ref 0.5–2.0)

## 2015-08-27 LAB — AMMONIA: Ammonia: 128 umol/L — ABNORMAL HIGH (ref 9–35)

## 2015-08-27 MED ORDER — PANTOPRAZOLE SODIUM 40 MG IV SOLR
40.0000 mg | Freq: Two times a day (BID) | INTRAVENOUS | Status: DC
  Administered 2015-08-27: 40 mg via INTRAVENOUS
  Filled 2015-08-27: qty 40

## 2015-08-27 MED ORDER — INSULIN ASPART 100 UNIT/ML ~~LOC~~ SOLN
0.0000 [IU] | Freq: Four times a day (QID) | SUBCUTANEOUS | Status: DC
  Administered 2015-08-27: 2 [IU] via SUBCUTANEOUS
  Filled 2015-08-27: qty 1

## 2015-08-27 MED ORDER — RIFAXIMIN 550 MG PO TABS
550.0000 mg | ORAL_TABLET | Freq: Once | ORAL | Status: DC
  Filled 2015-08-27: qty 1

## 2015-08-27 MED ORDER — LACTULOSE 10 GM/15ML PO SOLN
30.0000 g | Freq: Once | ORAL | Status: DC
  Filled 2015-08-27: qty 45

## 2015-08-27 MED ORDER — SODIUM CHLORIDE 0.45 % IV SOLN
INTRAVENOUS | Status: DC
  Administered 2015-08-27 – 2015-08-28 (×2): via INTRAVENOUS

## 2015-08-27 MED ORDER — DEXTROSE 5 % IV SOLN
4.0000 10*6.[IU] | Freq: Four times a day (QID) | INTRAVENOUS | Status: DC
Start: 2015-08-27 — End: 2015-08-28
  Administered 2015-08-27 – 2015-08-28 (×2): 4 10*6.[IU] via INTRAVENOUS
  Filled 2015-08-27 (×5): qty 4

## 2015-08-27 MED ORDER — LACTULOSE ENEMA
300.0000 mL | ORAL | Status: DC
  Administered 2015-08-27 – 2015-08-28 (×3): 300 mL via RECTAL
  Filled 2015-08-27 (×8): qty 300

## 2015-08-27 MED ORDER — LACTULOSE ENEMA
300.0000 mL | Freq: Once | ORAL | Status: AC
  Administered 2015-08-27: 300 mL via RECTAL
  Filled 2015-08-27: qty 300

## 2015-08-27 NOTE — ED Notes (Signed)
Ultrasound tech called-- cancelled paracentesis for today due to lethargy per radiology PA. Will reschedule for tomorrow.

## 2015-08-27 NOTE — ED Notes (Signed)
Pt placed on bedpan

## 2015-08-27 NOTE — ED Provider Notes (Signed)
CSN: [id number]     Arrival date & time 08/27/15  1451 History   First MD Initiated Contact with Patient 08/27/15 1532     Chief Complaint  Patient presents with  . Altered Mental Status    Low 5 caveat due to altered mental status. Patient is a 65 y.o. male presenting with altered mental status. The history is provided by the patient.  Altered Mental Status  patient presents with altered mental status. Has been more sleepy. Denies headache. Has a history of cirrhosis due to [place]. Was due to get his twice a week paracentesis, but was unable to get it because he was more sleepy. He has had a cough over the last week. Had a chest x-ray that was  Negative 5 days ago. No dysuria. No bleeding. No dysuria. His ammonia was elevated 5 days ago.  Past Medical History  Diagnosis Date  . Ascites   . Acute hepatic encephalopathy ([place])   . Acute renal failure superimposed on stage 3 chronic kidney disease ([place])   . Anemia of chronic disease   . Bleeding esophageal varices ([place])   . Coagulopathy ([place])   . Diabetes mellitus type II, controlled ([place])   . Depression   . Diabetic neuropathy ([place])   . Esophageal stricture   . NASH (nonalcoholic steatohepatitis)   . Protein-calorie malnutrition, severe ([place])   . Secondary biliary cirrhosis ([place])   . Thrombocytopenia ([place])   . 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 Kidney stone surgery  yrs ago  . Esophagogastroduodenoscopy  08/01/2011    Procedure: ESOPHAGOGASTRODUODENOSCOPY (EGD);  Surgeon: Scarlette Shorts, MD;  Location: [facility];  Service: Endoscopy;  Laterality: N/A;  . Esophagogastroduodenoscopy N/A 10/27/2012    Procedure: ESOPHAGOGASTRODUODENOSCOPY (EGD);  Surgeon: Inda Castle, MD;  Location: [facility];  Service: Endoscopy;  Laterality: N/A;  . Esophagogastroduodenoscopy N/A 11/22/2012    Procedure: ESOPHAGOGASTRODUODENOSCOPY (EGD);  Surgeon: Irene Shipper, MD;  Location:  [facility];  Service: Endoscopy;  Laterality: N/A;  . Colonoscopy    . Upper gastrointestinal endoscopy    . Esophagogastroduodenoscopy N/A 11/29/2013    Procedure: ESOPHAGOGASTRODUODENOSCOPY (EGD);  Surgeon: Irene Shipper, MD;  Location: [facility];  Service: Endoscopy;  Laterality: N/A;  . Esophageal banding Bilateral 11/29/2013    Procedure: ESOPHAGEAL BANDING;  Surgeon: Irene Shipper, MD;  Location: [facility];  Service: Endoscopy;  Laterality: Bilateral;  . Esophagogastroduodenoscopy N/A 06/13/2014    Procedure: ESOPHAGOGASTRODUODENOSCOPY (EGD);  Surgeon: Irene Shipper, MD;  Location: [facility];  Service: Endoscopy;  Laterality: N/A;  . Esophageal banding N/A 06/13/2014    Procedure: ESOPHAGEAL BANDING;  Surgeon: Irene Shipper, MD;  Location: [facility];  Service: Endoscopy;  Laterality: N/A;   Family History  Problem Relation Age of Onset  . Colon cancer Neg Hx   . Anesthesia problems Neg Hx   . Hypotension Neg Hx   . Malignant hyperthermia Neg Hx   . Pseudochol deficiency Neg Hx   . Diabetes Father   . Heart disease Father   . Aneurysm Mother   . Heart disease Other     neice  . Diabetes      niece   Social History  Substance Use Topics  . Smoking status: Never Smoker   . Smokeless tobacco: Never Used  . Alcohol Use: No     Comment: very rare    Review of Systems  Unable to perform ROS: Mental  status change      Allergies  Review of patient's allergies indicates no known allergies.  Home Medications   Prior to Admission medications   Medication Sig Start Date End Date Taking? Authorizing Provider  Amino Acids-Protein Hydrolys (FEEDING SUPPLEMENT, PRO-STAT SUGAR FREE 64,) LIQD Take 30 mLs by mouth 3 (three) times daily. 07/31/15   Belkys A Regalado, MD  amoxicillin (AMOXIL) 500 MG capsule Take 1 capsule (500 mg total) by mouth 3 (three) times daily. 08/13/15   Truman Hayward, MD  baclofen (LIORESAL) 20 MG tablet Take 1 tablet (20 mg total) by mouth at  bedtime. 06/11/15   Meredith Staggers, MD  bethanechol (URECHOLINE) 50 MG tablet Take 1 tablet by mouth 2 (two) times daily.  12/19/14   Historical Provider, MD  fluconazole (DIFLUCAN) 100 MG tablet Take 1 tablet by mouth daily. 07/19/15   Historical Provider, MD  FLUoxetine (PROZAC) 10 MG tablet Take 10 mg by mouth daily.    Historical Provider, MD  fluticasone (FLONASE) 50 MCG/ACT nasal spray Place 1 spray into both nostrils daily. 08/02/15   Historical Provider, MD  gabapentin (NEURONTIN) 100 MG capsule Take 100 mg by mouth 3 (three) times daily.     Historical Provider, MD  Insulin Degludec (TRESIBA FLEXTOUCH) 200 UNIT/ML SOPN Inject 20 Units into the skin daily. Patient taking differently: Inject 24 Units into the skin daily.  07/31/15   Belkys A Regalado, MD  lactulose (CHRONULAC) 10 GM/15ML solution Take 45 mLs (30 g total) by mouth 3 (three) times daily. 08/09/15   Irene Shipper, MD  lactulose (CHRONULAC) 10 GM/15ML solution Take 45 mLs (30 g total) by mouth 3 (three) times daily. 08/14/15   Irene Shipper, MD  Multiple Vitamin (MULTIVITAMIN WITH MINERALS) TABS tablet Take 1 tablet by mouth daily.    Historical Provider, MD  pantoprazole (PROTONIX) 40 MG tablet TAKE 1 TABLET (40 MG TOTAL) BY MOUTH DAILY. 11/13/14   Irene Shipper, MD  tamsulosin (FLOMAX) 0.4 MG CAPS capsule Take 1 capsule by mouth 2 (two) times daily.  12/19/14   Historical Provider, MD  Vitamin D, Ergocalciferol, (DRISDOL) 50000 UNITS CAPS capsule Take 50,000 Units by mouth every 7 (seven) days. Mondays. 03/29/15   Historical Provider, MD  XIFAXAN 550 MG TABS tablet TAKE 1 TABLET (550 MG TOTAL) BY MOUTH 2 (TWO) TIMES DAILY. 08/27/15   Irene Shipper, MD   BP 117/84 mmHg  Pulse 74  Temp(Src) 97.3 F (36.3 C) (Oral)  Resp 10  SpO2 100% Physical Exam  Constitutional: He appears well-developed.  HENT:  Head: Atraumatic.  Neck: Neck supple.  Cardiovascular: Normal rate.   Pulmonary/Chest: Effort normal.  Abdominal: He exhibits distension.  There is no tenderness.  Neurological: He is alert.  Patient is awake and pleasant but somewhat confused. He is able tell me his name the name of his wife. He however can tell me the year but not the date. He  said that the month is 12.  Skin: Skin is warm.    ED Course  Procedures (including critical care time) Labs Review Labs Reviewed  COMPREHENSIVE METABOLIC PANEL - Abnormal; Notable for the following:    Sodium 132 (*)    CO2 18 (*)    Glucose, Bld 294 (*)    BUN 46 (*)    Creatinine, Ser 2.16 (*)    Total Protein 6.3 (*)    AST 51 (*)    Total Bilirubin 1.3 (*)    GFR calc  non Af Amer 31 (*)    GFR calc Af Amer 36 (*)    All other components within normal limits  CBC - Abnormal; Notable for the following:    WBC 3.2 (*)    RBC 3.35 (*)    Hemoglobin 9.9 (*)    HCT 29.3 (*)    RDW 15.6 (*)    Platelets 22 (*)    All other components within normal limits  AMMONIA - Abnormal; Notable for the following:    Ammonia 128 (*)    All other components within normal limits  URINALYSIS, ROUTINE W REFLEX MICROSCOPIC (NOT AT [facility])  I-STAT CG4 LACTIC ACID, ED    Imaging Review Dg Chest 2 View  08/27/2015  CLINICAL DATA:  Cirrhosis, altered mental status EXAM: CHEST  2 VIEW COMPARISON:  08/22/2015 FINDINGS: Cardiomediastinal silhouette is stable. There is elevation of the right hemidiaphragm. No infiltrate or pulmonary edema. Mild basilar atelectasis. IMPRESSION: Cardiomegaly. Elevation of the right hemidiaphragm. Mild basilar atelectasis. No pulmonary edema. Electronically Signed   By: Lahoma Crocker M.D.   On: 08/27/2015 16:19   US Abdomen Limited  08/27/2015  CLINICAL DATA:  Evaluate ascites for possible paracentesis EXAM: LIMITED ABDOMEN ULTRASOUND FOR ASCITES TECHNIQUE: Limited ultrasound survey for ascites was performed in all four abdominal quadrants. COMPARISON:  Abdominal ultrasound of August 23, 2015 FINDINGS: The patient was noted be quite lethargic and confused and precluded  paracentesis. A moderate volume of fluid is seen in all quadrants of the abdomen. IMPRESSION: There is a moderate volume of ascites present. Ascites could not be performed due to the patient's clinical condition as perched [name], PAC. Electronically Signed   By: David  Martinique M.D.   On: 08/27/2015 15:35   I have personally reviewed and evaluated these images and lab results as part of my medical decision-making.   EKG Interpretation None      MDM   Final diagnoses:  Hepatic encephalopathy ([place])    Patient with cirrhosis from NASH. Worsening confusion. Unable to get his twice a week paracentesis. Ammonia is more elevated than it was a couple days ago. This goes along with a hepatic causing the altered mental status. Intracranially abnormality such as bleed felt less likely. Will admit to internal medicine.    Davonna Belling, MD 08/27/15 [phone]

## 2015-08-27 NOTE — ED Notes (Signed)
Dr, Sheran Fava MD at bedside.

## 2015-08-27 NOTE — ED Notes (Signed)
Patient here with altered loc, was scheduled to have paracentesis today but too sleepy to have procedure. Alert to person only. Denies pain. Will awoke to verbal command

## 2015-08-27 NOTE — Progress Notes (Signed)
Patient ID: [name], male   DOB: May 06, 1951, 65 y.o.   MRN: [id number] Patient presented today for outpatient paracentesis.  His vital signs were stable; however, the patient was very lethargic.  He was unable to even hold his eyes open to have a conversation.  I called Cecille Rubin, PA-C with [facility] and she encouraged [place] to send him to the ED for evaluation and likely admission.  Given he has cirrhosis, the thought is that his ammonia is likely elevated, but to make sure he does not have other issues as well, he will be sent to the ED for further evaluation.  If he gets admitted, then an order can be written for an inpatient paracentesis.  [name] E 3:15 PM 08/27/2015

## 2015-08-27 NOTE — ED Notes (Signed)
Patient was not able to get a sample at this time will try later

## 2015-08-27 NOTE — H&P (Signed)
[facility] History and Physical  [name] [id number] DOB: Jan 22, 1951 DOA: 08/27/2015  Referring physician:  Davonna Belling PCP:  [name], MD   Chief Complaint:  Confusion/AMS  HPI:  The patient is a 65 y.o. 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   He is from home with wife, spends more than 80% of his day in bed or in chair, and uses a rolling walker for limited ambulation. For the last 6 months he has had repeated hospitalizations, including one hospitalization in October, and 3 hospitalizations in January alone.  He was last seen by his gastroenterologist on 08/22/2015, at which time, [name] recommended continuing low volume paracenteses twice per week with a 3 L limit and albumin replacement to protect his kidneys.  He has been compliant with his medications and his wife keeps track and makes sure he takes them with no recent missed doses.  He has had two large bowel movements daily.  He has had some problems urinating recently with some dysuria.  He is encephalopathic and barely able to answer a few direct questions so his wife contributed to the history.  He has had progressive confusion and sleepiness for the last day with occasional jerking which is why she brought him to the ER.  In the emergency department, his vital signs were stable. Labs demonstrated stable pancytopenia with platelets of 22,000, similar to prior. Hemoglobin of 9.9.  Ammonia 128 despite medication compliance, creatinine 2.16 (baseline 1 and progressively  worsening likely due to hepatorenal syndrome and not a candidate for HD, last seen by Nephrology in January 2017).  He now has some metabolic acidosis and hyperglycemia. Urinalysis is pending.  He was ordered for oral rifaximin and lactulose but was too lethargic to take anything by mouth so they have been ordered rectally.    Review of Systems:  Patient confused and lethargic, unable to obtain.   Past Medical History  Diagnosis Date  . Ascites   . Acute hepatic encephalopathy ([place])   . Acute renal failure superimposed on stage 3 chronic kidney disease ([place])   . Anemia of chronic disease   . Bleeding esophageal varices ([place])   . Coagulopathy ([place])   . Diabetes mellitus type II, controlled ([place])   . Depression   . Diabetic neuropathy ([place])   . Esophageal stricture   . NASH (nonalcoholic steatohepatitis)   . Protein-calorie malnutrition, severe ([place])   . Secondary biliary cirrhosis ([place])   . Thrombocytopenia ([place])   . 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 Kidney stone surgery  yrs ago  . Esophagogastroduodenoscopy  08/01/2011    Procedure: ESOPHAGOGASTRODUODENOSCOPY (EGD);  Surgeon: Scarlette Shorts, MD;  Location: [facility];  Service: Endoscopy;  Laterality: N/A;  . Esophagogastroduodenoscopy N/A 10/27/2012    Procedure: ESOPHAGOGASTRODUODENOSCOPY (EGD);  Surgeon: Inda Castle, MD;  Location: [facility];  Service: Endoscopy;  Laterality: N/A;  . Esophagogastroduodenoscopy N/A 11/22/2012    Procedure: ESOPHAGOGASTRODUODENOSCOPY (EGD);  Surgeon: Irene Shipper, MD;  Location: [facility];  Service: Endoscopy;  Laterality: N/A;  . Colonoscopy    .  Upper gastrointestinal endoscopy    . Esophagogastroduodenoscopy N/A 11/29/2013    Procedure: ESOPHAGOGASTRODUODENOSCOPY (EGD);  Surgeon: Irene Shipper, MD;  Location: [facility];  Service: Endoscopy;  Laterality: N/A;  . Esophageal banding Bilateral 11/29/2013    Procedure:  ESOPHAGEAL BANDING;  Surgeon: Irene Shipper, MD;  Location: [facility];  Service: Endoscopy;  Laterality: Bilateral;  . Esophagogastroduodenoscopy N/A 06/13/2014    Procedure: ESOPHAGOGASTRODUODENOSCOPY (EGD);  Surgeon: Irene Shipper, MD;  Location: [facility];  Service: Endoscopy;  Laterality: N/A;  . Esophageal banding N/A 06/13/2014    Procedure: ESOPHAGEAL BANDING;  Surgeon: Irene Shipper, MD;  Location: [facility];  Service: Endoscopy;  Laterality: N/A;   Social History:  reports that he has never smoked. He has never used smokeless tobacco. He reports that he does not drink alcohol or use illicit drugs.   No Known Allergies  Family History  Problem Relation Age of Onset  . Colon cancer Neg Hx   . Anesthesia problems Neg Hx   . Hypotension Neg Hx   . Malignant hyperthermia Neg Hx   . Pseudochol deficiency Neg Hx   . Diabetes Father   . Heart disease Father   . Aneurysm Mother   . Heart disease Other     neice  . Diabetes      niece     Prior to Admission medications   Medication Sig Start Date End Date Taking? Authorizing Provider  Amino Acids-Protein Hydrolys (FEEDING SUPPLEMENT, PRO-STAT SUGAR FREE 64,) LIQD Take 30 mLs by mouth 3 (three) times daily. 07/31/15   Belkys A Regalado, MD  amoxicillin (AMOXIL) 500 MG capsule Take 1 capsule (500 mg total) by mouth 3 (three) times daily. 08/13/15   Truman Hayward, MD  baclofen (LIORESAL) 20 MG tablet Take 1 tablet (20 mg total) by mouth at bedtime. 06/11/15   Meredith Staggers, MD  bethanechol (URECHOLINE) 50 MG tablet Take 1 tablet by mouth 2 (two) times daily.  12/19/14   Historical Provider, MD  fluconazole (DIFLUCAN) 100 MG tablet Take 1 tablet by mouth daily. 07/19/15   Historical Provider, MD  FLUoxetine (PROZAC) 10 MG tablet Take 10 mg by mouth daily.    Historical Provider, MD  fluticasone (FLONASE) 50 MCG/ACT nasal spray Place 1 spray into both nostrils daily. 08/02/15   Historical Provider, MD  gabapentin (NEURONTIN) 100  MG capsule Take 100 mg by mouth 3 (three) times daily.     Historical Provider, MD  Insulin Degludec (TRESIBA FLEXTOUCH) 200 UNIT/ML SOPN Inject 20 Units into the skin daily. Patient taking differently: Inject 24 Units into the skin daily.  07/31/15   Belkys A Regalado, MD  lactulose (CHRONULAC) 10 GM/15ML solution Take 45 mLs (30 g total) by mouth 3 (three) times daily. 08/09/15   Irene Shipper, MD  lactulose (CHRONULAC) 10 GM/15ML solution Take 45 mLs (30 g total) by mouth 3 (three) times daily. 08/14/15   Irene Shipper, MD  Multiple Vitamin (MULTIVITAMIN WITH MINERALS) TABS tablet Take 1 tablet by mouth daily.    Historical Provider, MD  pantoprazole (PROTONIX) 40 MG tablet TAKE 1 TABLET (40 MG TOTAL) BY MOUTH DAILY. 11/13/14   Irene Shipper, MD  tamsulosin (FLOMAX) 0.4 MG CAPS capsule Take 1 capsule by mouth 2 (two) times daily.  12/19/14   Historical Provider, MD  Vitamin D, Ergocalciferol, (DRISDOL) 50000 UNITS CAPS capsule Take 50,000 Units by mouth every 7 (seven) days. Mondays. 03/29/15   Historical Provider, MD  [id number]  MG TABS tablet TAKE 1 TABLET (550 MG TOTAL) BY MOUTH 2 (TWO) TIMES DAILY. 08/27/15   Irene Shipper, MD   Physical Exam: Filed Vitals:   08/27/15 1500 08/27/15 1540 08/27/15 1542 08/27/15 1545  BP: 98/71 132/90 132/90 117/84  Pulse: 70 75 77 74  Temp: 97.3 F (36.3 C)     TempSrc: Oral     Resp: 20  16 10   SpO2: 98% 100% 100% 100%     General:  Adult male, snoring, eyes closed, arouses to firm touch  Eyes:  PERRL, anicteric, non-injected, unable to focus eyes on my face and falls asleep within 30 seconds of arousing  ENT:  Nares clear.  OP clear, non-erythematous without plaques or exudates.  MMM.  Neck:  Supple without TM or JVD.    Lymph:  No cervical, supraclavicular, or submandibular LAD.  Cardiovascular:  RRR, normal S1, S2, without m/r/g.  2+ pulses, warm extremities  Respiratory:  CTA bilaterally without increased WOB.  Abdomen:  NABS.  Severely distended  and tense but not tender to palpation, no guarding  Skin:  Petechial rash on bilateral ankles  Musculoskeletal:  Normal bulk and tone.  1+ LE edema.  Psychiatric:  Asleep, briefly arouseable, confused  Neurologic:  No obvious facial droop or focal neurologic deficits.    Labs on Admission:  Basic Metabolic Panel:  Recent Labs Lab 08/27/15 1510  NA 132*  K 4.5  CL 106  CO2 18*  GLUCOSE 294*  BUN 46*  CREATININE 2.16*  CALCIUM 9.8   Liver Function Tests:  Recent Labs Lab 08/27/15 1510  AST 51*  ALT 28  ALKPHOS 89  BILITOT 1.3*  PROT 6.3*  ALBUMIN 3.6   No results for input(s): LIPASE, AMYLASE in the last 168 hours.  Recent Labs Lab 08/22/15 1538 08/27/15 1523  AMMONIA 117* 128*   CBC:  Recent Labs Lab 08/22/15 1538 08/27/15 1510  WBC 2.8* 3.2*  NEUTROABS 2.0  --   HGB 9.8* 9.9*  HCT 28.5* 29.3*  MCV 88.9 87.5  PLT 23.0 Repeated and verified X2.* 22*   Cardiac Enzymes: No results for input(s): CKTOTAL, CKMB, CKMBINDEX, TROPONINI in the last 168 hours.  BNP (last 3 results)  Recent Labs  07/25/15 1144  BNP 75.7    ProBNP (last 3 results) No results for input(s): PROBNP in the last 8760 hours.  CBG: No results for input(s): GLUCAP in the last 168 hours.  Radiological Exams on Admission: Dg Chest 2 View  08/27/2015  CLINICAL DATA:  Cirrhosis, altered mental status EXAM: CHEST  2 VIEW COMPARISON:  08/22/2015 FINDINGS: Cardiomediastinal silhouette is stable. There is elevation of the right hemidiaphragm. No infiltrate or pulmonary edema. Mild basilar atelectasis. IMPRESSION: Cardiomegaly. Elevation of the right hemidiaphragm. Mild basilar atelectasis. No pulmonary edema. Electronically Signed   By: Lahoma Crocker M.D.   On: 08/27/2015 16:19   US Abdomen Limited  08/27/2015  CLINICAL DATA:  Evaluate ascites for possible paracentesis EXAM: LIMITED ABDOMEN ULTRASOUND FOR ASCITES TECHNIQUE: Limited ultrasound survey for ascites was performed in all four  abdominal quadrants. COMPARISON:  Abdominal ultrasound of August 23, 2015 FINDINGS: The patient was noted be quite lethargic and confused and precluded paracentesis. A moderate volume of fluid is seen in all quadrants of the abdomen. IMPRESSION: There is a moderate volume of ascites present. Ascites could not be performed due to the patient's clinical condition as perched [name], PAC. Electronically Signed   By: David  Martinique M.D.   On: 08/27/2015  15:35    EKG: Independently reviewed ECG from a week ago which demonstrated stable T-wave inversions in III and aVF and some possible mild ST segment depressions in V4-V6.    Assessment/Plan Active Problems:   Hepatic encephalopathy (HCC)  ---  Acute hepatic encephalopathy (HCC) due to NASH, which has recurred despite medication compliance and 2 large BMs daily.  Need to exclude infection and hemorrhage at contributing factors, but I worry that his disease is progressing.   - Ammonia 128 on admission, has been as high as 191 and recovered -  Hold all sedating medications, including baclofen, gabapentin -  Aspiration precautions and continuous pulse oximetry - AST minimally elevated - Start frequent rectal lactulose and if he recovers enough to tolerate PO, will transition to oral medication - q4h neuro checks - UA pending - send paracentesis fluid for analysis also -  CXR without evidence of pneumonia -  Monitor for bleeding  -  Palliative care consultation  Active Problems:  Esophageal varices ([place]) / ESOPHAGEAL STRICTURE - Start protonix IV    Thrombocytopenia ([place]) / Coagulapathy  - 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  - No reports of bleeding - Follow up daily CBC   Ascites - US guided paracentesis ordered with maximum volume of 3L   Acute renal failure superimposed on stage 3 chronic kidney disease ([place]), baseline creatinine of 1-1.2, but creatinine gradually increasing over last several months due to  probable hepatorenal syndrome - Not a candidate for hemodialysis due to comorbidities - start gentle hydration for now and repeat creatinine in AM   Anemia of chronic disease, hemoglobin at baseline near 10mg /dl   Depression - hold Prozac until able to tolerate PO   Controlled type 2 diabetes mellitus with diabetic nephropathy, without long-term current use of insulin ([place]) / Diabetic neuropathy, hyperglycemic - Recent A1c in 04/2015 was 6.4 - start low dose SSI  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  Discussed overall poor prognosis and not escalating to ICU level of care should he deteriorate.  Palliative care consult discussed with wife who is agreeable for [facility] and enrollment in hospice Disposition Plan: Admit to stepdown  Time spent: 60 min Chasten Blaze [facility] Pager [phone]  If 7PM-7AM, please contact night-coverage [email or web address] Password [facility] 08/27/2015, 6:24 PM

## 2015-08-27 NOTE — ED Notes (Signed)
Critical Lab Value called:  Platelets 22,000

## 2015-08-27 NOTE — ED Notes (Signed)
Patient transported to X-ray 

## 2015-08-28 ENCOUNTER — Inpatient Hospital Stay (HOSPITAL_COMMUNITY): Payer: Managed Care, Other (non HMO)

## 2015-08-28 DIAGNOSIS — Z515 Encounter for palliative care: Secondary | ICD-10-CM

## 2015-08-28 DIAGNOSIS — K7460 Unspecified cirrhosis of liver: Secondary | ICD-10-CM

## 2015-08-28 LAB — GRAM STAIN

## 2015-08-28 LAB — BODY FLUID CELL COUNT WITH DIFFERENTIAL
LYMPHS FL: 36 %
Monocyte-Macrophage-Serous Fluid: 57 % (ref 50–90)
NEUTROPHIL FLUID: 7 % (ref 0–25)
WBC FLUID: 82 uL (ref 0–1000)

## 2015-08-28 LAB — COMPREHENSIVE METABOLIC PANEL
ALBUMIN: 3.3 g/dL — AB (ref 3.5–5.0)
ALT: 26 U/L (ref 17–63)
ANION GAP: 9 (ref 5–15)
AST: 34 U/L (ref 15–41)
Alkaline Phosphatase: 79 U/L (ref 38–126)
BILIRUBIN TOTAL: 1.5 mg/dL — AB (ref 0.3–1.2)
BUN: 46 mg/dL — ABNORMAL HIGH (ref 6–20)
CO2: 16 mmol/L — ABNORMAL LOW (ref 22–32)
Calcium: 9.5 mg/dL (ref 8.9–10.3)
Chloride: 109 mmol/L (ref 101–111)
Creatinine, Ser: 2.12 mg/dL — ABNORMAL HIGH (ref 0.61–1.24)
GFR calc Af Amer: 37 mL/min — ABNORMAL LOW (ref >60)
GFR calc non Af Amer: 32 mL/min — ABNORMAL LOW (ref >60)
GLUCOSE: 156 mg/dL — AB (ref 65–99)
POTASSIUM: 4.2 mmol/L (ref 3.5–5.1)
Sodium: 134 mmol/L — ABNORMAL LOW (ref 135–145)
TOTAL PROTEIN: 5.6 g/dL — AB (ref 6.5–8.1)

## 2015-08-28 LAB — CBC WITH DIFFERENTIAL/PLATELET
BASOS PCT: 0 %
Basophils Absolute: 0 10*3/uL (ref 0.0–0.1)
EOS ABS: 0.1 10*3/uL (ref 0.0–0.7)
EOS PCT: 4 %
HCT: 28.4 % — ABNORMAL LOW (ref 39.0–52.0)
Hemoglobin: 9.9 g/dL — ABNORMAL LOW (ref 13.0–17.0)
LYMPHS ABS: 0.2 10*3/uL — AB (ref 0.7–4.0)
Lymphocytes Relative: 9 %
MCH: 30.7 pg (ref 26.0–34.0)
MCHC: 34.9 g/dL (ref 30.0–36.0)
MCV: 88.2 fL (ref 78.0–100.0)
MONO ABS: 0.6 10*3/uL (ref 0.1–1.0)
MONOS PCT: 21 %
NEUTROS ABS: 1.8 10*3/uL (ref 1.7–7.7)
NEUTROS PCT: 66 %
PLATELETS: 18 10*3/uL — AB (ref 150–400)
RBC: 3.22 MIL/uL — ABNORMAL LOW (ref 4.22–5.81)
RDW: 15.8 % — AB (ref 11.5–15.5)
WBC: 2.7 10*3/uL — ABNORMAL LOW (ref 4.0–10.5)

## 2015-08-28 LAB — GLUCOSE, CAPILLARY
GLUCOSE-CAPILLARY: 129 mg/dL — AB (ref 65–99)
GLUCOSE-CAPILLARY: 226 mg/dL — AB (ref 65–99)
GLUCOSE-CAPILLARY: 204 mg/dL — AB (ref 65–99)
Glucose-Capillary: 148 mg/dL — ABNORMAL HIGH (ref 65–99)
Glucose-Capillary: 182 mg/dL — ABNORMAL HIGH (ref 65–99)
Glucose-Capillary: 188 mg/dL — ABNORMAL HIGH (ref 65–99)

## 2015-08-28 LAB — AMMONIA: Ammonia: 71 umol/L — ABNORMAL HIGH (ref 9–35)

## 2015-08-28 LAB — MRSA PCR SCREENING: MRSA BY PCR: NEGATIVE

## 2015-08-28 LAB — MAGNESIUM: MAGNESIUM: 2.7 mg/dL — AB (ref 1.7–2.4)

## 2015-08-28 MED ORDER — INSULIN ASPART 100 UNIT/ML ~~LOC~~ SOLN
0.0000 [IU] | Freq: Three times a day (TID) | SUBCUTANEOUS | Status: DC
  Administered 2015-08-28 – 2015-08-29 (×2): 3 [IU] via SUBCUTANEOUS
  Administered 2015-08-29: 2 [IU] via SUBCUTANEOUS
  Administered 2015-08-29 – 2015-08-30 (×4): 3 [IU] via SUBCUTANEOUS
  Administered 2015-08-31: 2 [IU] via SUBCUTANEOUS
  Administered 2015-08-31: 3 [IU] via SUBCUTANEOUS

## 2015-08-28 MED ORDER — FLUCONAZOLE 100 MG PO TABS
100.0000 mg | ORAL_TABLET | Freq: Every day | ORAL | Status: DC
  Administered 2015-08-28 – 2015-08-31 (×4): 100 mg via ORAL
  Filled 2015-08-28 (×4): qty 1

## 2015-08-28 MED ORDER — LIDOCAINE HCL (PF) 1 % IJ SOLN
INTRAMUSCULAR | Status: AC
  Filled 2015-08-28: qty 10

## 2015-08-28 MED ORDER — TAMSULOSIN HCL 0.4 MG PO CAPS
0.4000 mg | ORAL_CAPSULE | Freq: Two times a day (BID) | ORAL | Status: DC
  Administered 2015-08-28 – 2015-08-31 (×7): 0.4 mg via ORAL
  Filled 2015-08-28 (×7): qty 1

## 2015-08-28 MED ORDER — PRO-STAT SUGAR FREE PO LIQD
30.0000 mL | Freq: Three times a day (TID) | ORAL | Status: DC
  Administered 2015-08-28 – 2015-08-31 (×8): 30 mL via ORAL
  Filled 2015-08-28 (×7): qty 30

## 2015-08-28 MED ORDER — RIFAXIMIN 550 MG PO TABS
550.0000 mg | ORAL_TABLET | Freq: Two times a day (BID) | ORAL | Status: DC
  Administered 2015-08-28 – 2015-08-31 (×6): 550 mg via ORAL
  Filled 2015-08-28 (×7): qty 1

## 2015-08-28 MED ORDER — FLUOXETINE HCL 10 MG PO CAPS
10.0000 mg | ORAL_CAPSULE | Freq: Every day | ORAL | Status: DC
  Administered 2015-08-28 – 2015-08-31 (×4): 10 mg via ORAL
  Filled 2015-08-28 (×4): qty 1

## 2015-08-28 MED ORDER — AMOXICILLIN 500 MG PO CAPS
500.0000 mg | ORAL_CAPSULE | Freq: Three times a day (TID) | ORAL | Status: DC
  Administered 2015-08-28 – 2015-08-31 (×9): 500 mg via ORAL
  Filled 2015-08-28 (×11): qty 1

## 2015-08-28 MED ORDER — LACTULOSE 10 GM/15ML PO SOLN
30.0000 g | Freq: Three times a day (TID) | ORAL | Status: DC
  Administered 2015-08-28 – 2015-08-29 (×3): 30 g via ORAL
  Filled 2015-08-28 (×3): qty 45

## 2015-08-28 MED ORDER — PANTOPRAZOLE SODIUM 40 MG PO TBEC
40.0000 mg | DELAYED_RELEASE_TABLET | Freq: Every day | ORAL | Status: DC
  Administered 2015-08-28 – 2015-08-31 (×4): 40 mg via ORAL
  Filled 2015-08-28 (×4): qty 1

## 2015-08-28 NOTE — ED Notes (Signed)
CBG is 182 

## 2015-08-28 NOTE — Progress Notes (Signed)
Utilization Review Completed.  

## 2015-08-28 NOTE — Consult Note (Signed)
Consultation Note Date: 08/28/2015   Patient Name: [name]  DOB: July 22, 1950  MRN: [id number]  Age / Sex: 65 y.o., male  PCP: Doreene Eland, MD Referring Physician: Renae Fickle, MD  Reason for Consultation: Establishing goals of care    Clinical Assessment/Narrative: I met today with Mrs. Knoche and their son. Mr. Fitzmaurice is still very lethargic and didn't even awaken. 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 We discussed expectations and goals. Their main goal for him is to get him home and keep him home as long as they are able too. Wife and son agree to work towards this. We discussed progression of disease and the use of hospice at home. We also discussed use of hospice facility as well. They also express interest in hiring private help at home for Mrs. Banbury.   I also recommended that a peritoneal drain be considered for management of ascites. They would like to speak with Dr. Marina Goodell about this which I encouraged them to do so.   They are also concerned that he does not truly understand how severely ill he is at this time. Mrs. Jurewicz says that he has a cousin that received a liver transplant over 10 years ago and that she believes he thinks that will be what will happen with him. They have been told that he is not a candidate for transplant this admission. We discussed that if he becomes more clear we can discuss with him his prognosis but he is lethargic and still confused. Emotional support provided.   Contacts/Participants in Discussion: Primary Decision Maker: Mrs. Leyendecker - wife    SUMMARY OF RECOMMENDATIONS - Goal is to get him back home - Agree with the use of hospice - Consider drain to manage ascites  Code Status/Advance Care Planning: DNR    Code Status Orders        Start     Ordered   08/27/15 1948  Do not attempt resuscitation (DNR)   Continuous    Question  Answer Comment  In the event of cardiac or respiratory ARREST Do not call a "code blue"   In the event of cardiac or respiratory ARREST Do not perform Intubation, CPR, defibrillation or ACLS   In the event of cardiac or respiratory ARREST Use medication by any route, position, wound care, and other measures to relive pain and suffering. May use oxygen, suction and manual treatment of airway obstruction as needed for comfort.      08/27/15 1947    Code Status History    Date Active Date Inactive Code Status Order ID Comments User Context   08/05/2015 12:24 AM 08/05/2015  6:53 PM Full Code [id number]  Hillary Bow, DO ED   07/25/2015  8:49 PM 07/31/2015  7:23 PM Full Code [id number]  Alison Murray, MD Inpatient   07/10/2015  8:06 PM 07/13/2015  2:36 PM DNR [id number]  Zannie Cove, MD Inpatient   04/26/2015  6:38 PM 04/28/2015  4:22 PM DNR [id number]  Rodolph Bong, MD ED   01/30/2015  5:40 PM 01/31/2015  7:33 PM Full Code [id number]  Rhetta Mura, MD Inpatient   10/25/2014  6:24 PM 11/11/2014  2:26 PM Full Code [id number]  Jacquelynn Cree, PA-C Inpatient   10/20/2014 11:03 PM 10/25/2014  6:24 PM Full Code [id number]  Pearson Grippe, MD Inpatient    Advance Directive Documentation        Most Recent Value   Type of Advance Directive  Healthcare  Power of Attorney   Pre-existing out of facility DNR order (yellow form or pink MOST form)     "MOST" Form in Place?        Symptom Management:   Encephalopathy: Continue lactulose.   Ascites: Consider PleurX to manage ascites at home.   Palliative Prophylaxis:   Bowel Regimen, Delirium Protocol and Frequent Pain Assessment   Psycho-social/Spiritual:  Support System: Adequate Desire for further Chaplaincy support:yes Additional Recommendations: Caregiving  Support/Resources and Education on Hospice  Prognosis: <6 months  Discharge Planning: Home with Hospice   Chief Complaint/ Primary Diagnoses: Present on Admission:  . Hepatic encephalopathy  ([place]) . Acute hepatic encephalopathy ([place]) . Acute renal failure superimposed on stage 3 chronic kidney disease ([place]) . Anemia of chronic disease . Ascites . Protein-calorie malnutrition, severe . Thrombocytopenia ([place])  I have reviewed the medical record, interviewed the patient and family, and examined the patient. The following aspects are pertinent.  Past Medical History  Diagnosis Date  . Ascites   . Acute hepatic encephalopathy ([place])   . Acute renal failure superimposed on stage 3 chronic kidney disease ([place])   . Anemia of chronic disease   . Bleeding esophageal varices ([place])   . Coagulopathy ([place])   . Diabetes mellitus type II, controlled ([place])   . Depression   . Diabetic neuropathy ([place])   . Esophageal stricture   . NASH (nonalcoholic steatohepatitis)   . Protein-calorie malnutrition, severe ([place])   . Secondary biliary cirrhosis ([place])   . Thrombocytopenia ([place])   . Abscess in epidural space of cervical spine 08/13/2015   Social History   Social History  . Marital Status: Married    Spouse Name: N/A  . Number of Children: 1  . Years of Education: 13   Occupational History  . retired    Social History Main Topics  . Smoking status: Never Smoker   . Smokeless tobacco: Never Used  . Alcohol Use: No     Comment: very rare  . Drug Use: No  . Sexual Activity: No   Other Topics Concern  . None   Social History Narrative   Patient drinks caffeine 1-2 times per week.   Patient is right handed.    Family History  Problem Relation Age of Onset  . Colon cancer Neg Hx   . Anesthesia problems Neg Hx   . Hypotension Neg Hx   . Malignant hyperthermia Neg Hx   . Pseudochol deficiency Neg Hx   . Diabetes Father   . Heart disease Father   . Aneurysm Mother   . Heart disease Other     neice  . Diabetes      niece   Scheduled Meds: . amoxicillin  500 mg Oral TID  . feeding supplement (PRO-STAT SUGAR FREE 64)  30 mL Oral TID  . fluconazole  100 mg Oral Daily  .  FLUoxetine  10 mg Oral Daily  . insulin aspart  0-9 Units Subcutaneous TID WC  . lactulose  30 g Oral TID  . lidocaine (PF)      . pantoprazole  40 mg Oral Daily  . rifaximin  550 mg Oral BID  . tamsulosin  0.4 mg Oral BID   Continuous Infusions:  PRN Meds:. Medications Prior to Admission:  Prior to Admission medications   Medication Sig Start Date End Date Taking? Authorizing Provider  Amino Acids-Protein Hydrolys (FEEDING SUPPLEMENT, PRO-STAT SUGAR FREE 64,) LIQD Take 30 mLs by mouth 3 (three) times daily. Patient taking differently:  Take 30 mLs by mouth daily.  07/31/15  Yes Belkys A Regalado, MD  amoxicillin (AMOXIL) 500 MG capsule Take 1 capsule (500 mg total) by mouth 3 (three) times daily. 08/13/15  Yes Truman Hayward, MD  baclofen (LIORESAL) 20 MG tablet Take 1 tablet (20 mg total) by mouth at bedtime. 06/11/15  Yes Meredith Staggers, MD  bethanechol (URECHOLINE) 50 MG tablet Take 1 tablet by mouth 2 (two) times daily.  12/19/14  Yes Historical Provider, MD  fluconazole (DIFLUCAN) 100 MG tablet Take 1 tablet by mouth daily. 07/19/15  Yes Historical Provider, MD  FLUoxetine (PROZAC) 10 MG tablet Take 10 mg by mouth daily.   Yes Historical Provider, MD  fluticasone (FLONASE) 50 MCG/ACT nasal spray Place 1 spray into both nostrils daily. 08/02/15  Yes Historical Provider, MD  gabapentin (NEURONTIN) 100 MG capsule Take 100 mg by mouth 3 (three) times daily.    Yes Historical Provider, MD  Insulin Degludec (TRESIBA FLEXTOUCH) 200 UNIT/ML SOPN Inject 20 Units into the skin daily. 07/31/15  Yes Belkys A Regalado, MD  lactulose (CHRONULAC) 10 GM/15ML solution Take 45 mLs (30 g total) by mouth 3 (three) times daily. 08/14/15  Yes Irene Shipper, MD  Multiple Vitamin (MULTIVITAMIN WITH MINERALS) TABS tablet Take 1 tablet by mouth daily.   Yes Historical Provider, MD  pantoprazole (PROTONIX) 40 MG tablet TAKE 1 TABLET (40 MG TOTAL) BY MOUTH DAILY. 11/13/14  Yes Irene Shipper, MD  tamsulosin (FLOMAX) 0.4  MG CAPS capsule Take 1 capsule by mouth 2 (two) times daily.  12/19/14  Yes Historical Provider, MD  Vitamin D, Ergocalciferol, (DRISDOL) 50000 UNITS CAPS capsule Take 50,000 Units by mouth every 7 (seven) days. Mondays. 03/29/15  Yes Historical Provider, MD  XIFAXAN 550 MG TABS tablet TAKE 1 TABLET (550 MG TOTAL) BY MOUTH 2 (TWO) TIMES DAILY. 08/27/15  Yes Irene Shipper, MD   No Known Allergies  Review of Systems  Unable to perform ROS   Physical Exam  Constitutional: He appears well-developed. He appears lethargic.  HENT:  Head: Normocephalic and atraumatic.  Cardiovascular: Normal rate.   Respiratory: Effort normal. No accessory muscle usage. No tachypnea. No respiratory distress.  GI: He exhibits ascites.  Neurological: He appears lethargic.    Vital Signs: BP 98/68 mmHg  Pulse 66  Temp(Src) 97.4 F (36.3 C) (Oral)  Resp 10  Ht '5\' 10"'$  (1.778 m)  Wt 84.7 kg (186 lb 11.7 oz)  BMI 26.79 kg/m2  SpO2 100%  SpO2: SpO2: 100 % O2 Device:SpO2: 100 % O2 Flow Rate: .   IO: Intake/output summary:  Intake/Output Summary (Last 24 hours) at 08/28/15 1336 Last data filed at 08/28/15 1300  Gross per 24 hour  Intake 1646.25 ml  Output   1100 ml  Net 546.25 ml    LBM: Last BM Date: 08/28/15 Baseline Weight: Weight: 84.7 kg (186 lb 11.7 oz) Most recent weight: Weight: 84.7 kg (186 lb 11.7 oz)      Palliative Assessment/Data:  Flowsheet Rows        Most Recent Value   Intake Tab    Referral Department  Hospitalist   Unit at Time of Referral  ER   Palliative Care Primary Diagnosis  Other (Comment) [cirrhosis]   Date Notified  08/27/15   Palliative Care Type  New Palliative care   Reason for referral  Clarify Goals of Care   Date of Admission  08/27/15   # of days IP prior to Palliative referral  0   Clinical Assessment    Psychosocial & Spiritual Assessment    Palliative Care Outcomes       Additional Data Reviewed:  CBC:    Component Value Date/Time   WBC 2.7*  08/28/2015 0321   HGB 9.9* 08/28/2015 0321   HCT 28.4* 08/28/2015 0321   PLT 18* 08/28/2015 0321   MCV 88.2 08/28/2015 0321   NEUTROABS 1.8 08/28/2015 0321   LYMPHSABS 0.2* 08/28/2015 0321   MONOABS 0.6 08/28/2015 0321   EOSABS 0.1 08/28/2015 0321   BASOSABS 0.0 08/28/2015 0321   Comprehensive Metabolic Panel:    Component Value Date/Time   NA 134* 08/28/2015 0321   K 4.2 08/28/2015 0321   CL 109 08/28/2015 0321   CO2 16* 08/28/2015 0321   BUN 46* 08/28/2015 0321   CREATININE 2.12* 08/28/2015 0321   CREATININE 2.21* 08/13/2015 1530   GLUCOSE 156* 08/28/2015 0321   CALCIUM 9.5 08/28/2015 0321   AST 34 08/28/2015 0321   ALT 26 08/28/2015 0321   ALKPHOS 79 08/28/2015 0321   BILITOT 1.5* 08/28/2015 0321   PROT 5.6* 08/28/2015 0321   ALBUMIN 3.3* 08/28/2015 0321     Time In: 1250 Time Out: 1350 Time Total: 18mn Greater than 50%  of this time was spent counseling and coordinating care related to the above assessment and plan.  Signed by: PPershing Proud NP  APershing Proud NP  28/33/3832 1:36 PM  Please contact Palliative Medicine Team phone at [phone] questions and concerns.

## 2015-08-28 NOTE — Procedures (Signed)
Interventional Radiology Procedure Note  Procedure: US guided paracentesis.  3L max.  Sample sent Complications: None Recommendations:  - Ok to shower tomorrow - Do not submerge for 7 days - Routine care  - follow up labs  Signed,  Dulcy Fanny. Earleen Newport, DO

## 2015-08-28 NOTE — Progress Notes (Addendum)
[facility] PROGRESS NOTE  [name] [id number] DOB: 1951/03/08 DOA: 08/27/2015 PCP: [name], MD  Brief Summary  The patient is a 65 y.o. 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   He is from home with wife, spends more than 80% of his day in bed or in chair, and uses a rolling walker for limited ambulation. For the last 6 months he has had repeated hospitalizations, including one hospitalization in October, and 3 hospitalizations in January alone. He was last seen by his gastroenterologist on 08/22/2015, at which time, [name] recommended continuing low volume paracenteses twice per week with a 3 L limit and albumin replacement to protect his kidneys. He has been compliant with his medications and has had two large bowel movements daily. She brought him to the ER because of lethargy after he was too somnolent and confused to undergo routine outpatient paracentesis.    In the emergency department, his vital signs were stable. Labs demonstrated stable pancytopenia with platelets of 22,000, similar to prior. Hemoglobin of 9.9. Ammonia 128 despite medication compliance, creatinine 2.16 (baseline 1 and progressively worsening likely due to hepatorenal syndrome and not a candidate for HD, last seen by Nephrology in January 2017).  He was ordered for oral rifaximin and lactulose but was too lethargic to take anything by mouth so he was started on rectal lactulose and admitted.   Assessment/Plan  Acute hepatic encephalopathy (HCC) due to NASH, which has  recurred despite medication compliance and 2 large BMs daily. No evidence of infection or hemorrhage as a trigger, however, I worry that his disease is progressing.  More alert today after several lactulose enemas.   - Ammonia 128 on admission and trended down to 71 - Continue to hold baclofen, gabapentin -  UA negative and CXR without evidence of pneumonia -  Appreciate radiology assistance for paracentesis:  3L removed, neg for evid of SBP -  Hemoglobin stable - SLP:  Cleared for regular diet -  Resume home dose medications for now - Palliative care consultation pending.   -  PT/OT assessment.  Plan for either home with hospice or to SNF with palliative care   Active Problems:  Esophageal varices ([place]) / ESOPHAGEAL STRICTURE - chagne to oral protonix   Thrombocytopenia ([place]) / Coagulapathy, trending down slightly - LIkely due to liver cirrhosis, however more severe than typical   Ascites s/p US guided paracentesis on 2/21 -  Consider palliative paracentesis catheter, especially since already on chronic suppressive antibiotics   Acute renal failure superimposed on stage 3 chronic kidney disease ([place]), baseline creatinine of 1-1.2, but creatinine gradually increasing over last several months due to probable hepatorenal syndrome - Not a candidate for hemodialysis due to comorbidities - d/c IVF -  Consider octreotide and midodrine if worsening   Anemia of chronic disease, hemoglobin at baseline near 10mg /dl   Depression - hold Prozac until able to tolerate PO   Controlled type 2 diabetes mellitus with diabetic nephropathy, without long-term current use of insulin ([place]) / Diabetic neuropathy, hyperglycemic - Recent A1c in 04/2015 was 6.4 - continue low dose SSI  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 Tommy Medal post-discharge  Severe protein calorie malnutrition - Liberalize  diet - Supplements  Diet: regular Access: PIV IVF: off Proph: SCDs  Code Status: DNR/DNI Family Communication: patient and his wife. Discussed overall poor prognosis and not escalating to ICU level of care should he deteriorate. Palliative care consult discussed with wife who is agreeable for [place] and enrollment in hospice Disposition Plan:  Transfer to med-surg awaiting PT evaluation and time to establish if he needs an adjustment to his lactulose dose  Consultants:  Palliative care  Radiology  Procedures:  US guided paracentesis on 2/21  Antibiotics:  Penicillin G 2/20 > 2/21  Resumed amoxicillin PO on 2/21 >   HPI/Subjective:  Arouseable, more alert.  Denies pain, SOB, nausea, vomiting.  Had a BM this morning.  + cough without fevers or chills.  Objective: Filed Vitals:   08/28/15 0347 08/28/15 0725 08/28/15 1237 08/28/15 1509  BP: 117/84 91/32 98/68  103/48  Pulse: 78 71 66 72  Temp: 97.5 F (36.4 C) 97.3 F (36.3 C) 97.4 F (36.3 C)   TempSrc: Oral Oral Oral   Resp: 12 9 10 13   Height: 5\' 10"  (1.778 m)     Weight: 84.7 kg (186 lb 11.7 oz)     SpO2: 100% 99% 100% 100%    Intake/Output Summary (Last 24 hours) at 08/28/15 1548 Last data filed at 08/28/15 1400  Gross per 24 hour  Intake 1886.25 ml  Output   1100 ml  Net 786.25 ml   Filed Weights   08/28/15 0347  Weight: 84.7 kg (186 lb 11.7 oz)   Body mass index is 26.79 kg/(m^2).  Exam:   General:  Adult male, sound asleep but eventually awoke after several seconds of forceful tactile stimulus. No acute distress  HEENT:  NCAT, MMM  Cardiovascular:  RRR, nl S1, S2 no mrg, 2+ pulses, warm extremities  Respiratory:  CTAB, no increased WOB  Abdomen:   NABS, soft, moderately distended, nontender  MSK:   Normal tone and bulk, 1+ pitting bilateral LEE  Neuro:  No resting asterixis today, grossly moves all extremities  Data Reviewed: Basic Metabolic Panel:  Recent Labs Lab  08/27/15 1510 08/28/15 0321  NA 132* 134*  K 4.5 4.2  CL 106 109  CO2 18* 16*  GLUCOSE 294* 156*  BUN 46* 46*  CREATININE 2.16* 2.12*  CALCIUM 9.8 9.5  MG  --  2.7*   Liver Function Tests:  Recent Labs Lab 08/27/15 1510 08/28/15 0321  AST 51* 34  ALT 28 26  ALKPHOS 89 79  BILITOT 1.3* 1.5*  PROT 6.3* 5.6*  ALBUMIN 3.6 3.3*   No results for input(s): LIPASE, AMYLASE in the last 168 hours.  Recent Labs Lab 08/22/15 1538 08/27/15 1523 08/28/15 0321  AMMONIA 117* 128* 71*   CBC:  Recent Labs Lab 08/22/15 1538 08/27/15 1510 08/28/15 0321  WBC 2.8* 3.2* 2.7*  NEUTROABS 2.0  --  1.8  HGB 9.8* 9.9* 9.9*  HCT 28.5* 29.3* 28.4*  MCV 88.9 87.5 88.2  PLT 23.0 Repeated and verified X2.* 22* 18*    Recent Results (from the past 240 hour(s))  Urine Culture     Status: None   Collection Time: 08/22/15  3:38 PM  Result Value Ref Range Status   Colony Count NO GROWTH  Final   Organism ID, Bacteria NO GROWTH  Final  MRSA PCR Screening  Status: None   Collection Time: 08/28/15  3:52 AM  Result Value Ref Range Status   MRSA by PCR NEGATIVE NEGATIVE Final    Comment:        The GeneXpert MRSA Assay (FDA approved for NASAL specimens only), is one component of a comprehensive MRSA colonization surveillance program. It is not intended to diagnose MRSA infection nor to guide or monitor treatment for MRSA infections.   Gram stain     Status: None   Collection Time: 08/28/15 10:05 AM  Result Value Ref Range Status   Specimen Description FLUID ASCITIC  Final   Special Requests NONE  Final   Gram Stain   Final    RARE WBC PRESENT,BOTH PMN AND MONONUCLEAR NO ORGANISMS SEEN    Report Status 08/28/2015 FINAL  Final     Studies: Dg Chest 2 View  08/27/2015  CLINICAL DATA:  Cirrhosis, altered mental status EXAM: CHEST  2 VIEW COMPARISON:  08/22/2015 FINDINGS: Cardiomediastinal silhouette is stable. There is elevation of the right hemidiaphragm. No infiltrate or  pulmonary edema. Mild basilar atelectasis. IMPRESSION: Cardiomegaly. Elevation of the right hemidiaphragm. Mild basilar atelectasis. No pulmonary edema. Electronically Signed   By: Lahoma Crocker M.D.   On: 08/27/2015 16:19   US Abdomen Limited  08/27/2015  CLINICAL DATA:  Evaluate ascites for possible paracentesis EXAM: LIMITED ABDOMEN ULTRASOUND FOR ASCITES TECHNIQUE: Limited ultrasound survey for ascites was performed in all four abdominal quadrants. COMPARISON:  Abdominal ultrasound of August 23, 2015 FINDINGS: The patient was noted be quite lethargic and confused and precluded paracentesis. A moderate volume of fluid is seen in all quadrants of the abdomen. IMPRESSION: There is a moderate volume of ascites present. Ascites could not be performed due to the patient's clinical condition as perched [name], PAC. Electronically Signed   By: David  Martinique M.D.   On: 08/27/2015 15:35   US Paracentesis  08/28/2015  INDICATION: 65 year old male with a history of ascites. Multiple prior paracentesis. Request for no more than 3.0 L aspiration. EXAM: ULTRASOUND GUIDED  PARACENTESIS MEDICATIONS: None. COMPLICATIONS: None PROCEDURE: Informed written consent was obtained from the patient after a discussion of the risks, benefits and alternatives to treatment. A timeout was performed prior to the initiation of the procedure. Initial ultrasound scanning demonstrates a large amount of ascites within the right lower abdominal quadrant. The right lower abdomen was prepped and draped in the usual sterile fashion. 1% lidocaine with epinephrine was used for local anesthesia. Following this, a yueh catheter was introduced. An ultrasound image was saved for documentation purposes. The paracentesis was performed. The catheter was removed and a dressing was applied. The patient tolerated the procedure well without immediate post procedural complication. FINDINGS: A total of approximately 3.0 L of thin yellow fluid was removed.  Samples were sent to the laboratory as requested by the clinical team. IMPRESSION: Status post ultrasound-guided paracentesis with 3.0 L fluid removed. Sample sent to the lab for analysis. Signed, Dulcy Fanny. Earleen Newport, DO [facility] Electronically Signed   By: Corrie Mckusick D.O.   On: 08/28/2015 10:25    Scheduled Meds: . amoxicillin  500 mg Oral TID  . feeding supplement (PRO-STAT SUGAR FREE 64)  30 mL Oral TID  . fluconazole  100 mg Oral Daily  . FLUoxetine  10 mg Oral Daily  . insulin aspart  0-9 Units Subcutaneous TID WC  . lactulose  30 g Oral TID  . lidocaine (PF)      .  pantoprazole  40 mg Oral Daily  . rifaximin  550 mg Oral BID  . tamsulosin  0.4 mg Oral BID   Continuous Infusions:   Principal Problem:   Hepatic encephalopathy (HCC) Active Problems:   Thrombocytopenia ([place])   Acute hepatic encephalopathy (HCC)   Ascites   Acute renal failure superimposed on stage 3 chronic kidney disease ([place])   Anemia of chronic disease   Protein-calorie malnutrition, severe   Type 2 diabetes mellitus ([place])    Time spent: 30 min    Kianah Harries, [place] [facility] Pager [phone]. If 7PM-7AM, please contact night-coverage at [email or web address], password [facility] 08/28/2015, 3:48 PM  LOS: 1 day

## 2015-08-28 NOTE — Evaluation (Signed)
Clinical/Bedside Swallow Evaluation Patient Details  Name: WINFRED SWEDENBURG MRN: [id number] Date of Birth: 11-05-1950  Today's Date: 08/28/2015 Time: SLP Start Time (ACUTE ONLY): 0903 SLP Stop Time (ACUTE ONLY): 0918 SLP Time Calculation (min) (ACUTE ONLY): 15 min  Past Medical History:  Past Medical History  Diagnosis Date  . Ascites   . Acute hepatic encephalopathy ([place])   . Acute renal failure superimposed on stage 3 chronic kidney disease ([place])   . Anemia of chronic disease   . Bleeding esophageal varices ([place])   . Coagulopathy ([place])   . Diabetes mellitus type II, controlled ([place])   . Depression   . Diabetic neuropathy ([place])   . Esophageal stricture   . NASH (nonalcoholic steatohepatitis)   . Protein-calorie malnutrition, severe ([place])   . Secondary biliary cirrhosis ([place])   . Thrombocytopenia ([place])   . Abscess in epidural space of cervical spine 08/13/2015   Past Surgical History:  Past Surgical History  Procedure Laterality Date  . Kidney stone surgery  yrs ago  . Esophagogastroduodenoscopy  08/01/2011    Procedure: ESOPHAGOGASTRODUODENOSCOPY (EGD);  Surgeon: Scarlette Shorts, MD;  Location: [facility];  Service: Endoscopy;  Laterality: N/A;  . Esophagogastroduodenoscopy N/A 10/27/2012    Procedure: ESOPHAGOGASTRODUODENOSCOPY (EGD);  Surgeon: Inda Castle, MD;  Location: [facility];  Service: Endoscopy;  Laterality: N/A;  . Esophagogastroduodenoscopy N/A 11/22/2012    Procedure: ESOPHAGOGASTRODUODENOSCOPY (EGD);  Surgeon: Irene Shipper, MD;  Location: [facility];  Service: Endoscopy;  Laterality: N/A;  . Colonoscopy    . Upper gastrointestinal endoscopy    . Esophagogastroduodenoscopy N/A 11/29/2013    Procedure: ESOPHAGOGASTRODUODENOSCOPY (EGD);  Surgeon: Irene Shipper, MD;  Location: [facility];  Service: Endoscopy;  Laterality: N/A;  . Esophageal banding Bilateral 11/29/2013    Procedure: ESOPHAGEAL BANDING;  Surgeon: Irene Shipper, MD;  Location: [facility];  Service:  Endoscopy;  Laterality: Bilateral;  . Esophagogastroduodenoscopy N/A 06/13/2014    Procedure: ESOPHAGOGASTRODUODENOSCOPY (EGD);  Surgeon: Irene Shipper, MD;  Location: [facility];  Service: Endoscopy;  Laterality: N/A;  . Esophageal banding N/A 06/13/2014    Procedure: ESOPHAGEAL BANDING;  Surgeon: Irene Shipper, MD;  Location: [facility];  Service: Endoscopy;  Laterality: N/A;   HPI:  65 y.o. male with h/o acute hepatic encephalopathy, bleeding esophageal varices, DM type 2, depression, diabetic neuropathy, esophageal stricture, NASH cirrhosis, massive GI bleeds post multiple endoscopies and ligations and severe protein-calorie malnutrition, who presented to ED with AMS and lethargy. CXR 2/20 mild basilar atelectasis, no pulmonary edema. BSE 10/26/2014 recommended regular diet with thin liquids.   Assessment / Plan / Recommendation Clinical Impression  Oral and pharyngeal swallow WFL's. No indications of airway compromise; mastication appropriate. Pt has history of esophageal strictures but no complaints of recent difficulty. Recommend regular texture, thin liquids, pills with liquid and esophageal precautions. No follow up needed.      Aspiration Risk  Mild aspiration risk    Diet Recommendation Regular;Thin liquid   Liquid Administration via: Cup;Straw Medication Administration: Whole meds with liquid Supervision: Patient able to self feed;Staff to assist with self feeding Compensations: Slow rate;Small sips/bites Postural Changes: Seated upright at 90 degrees;Remain upright for at least 30 minutes after po intake    Other  Recommendations Oral Care Recommendations: Oral care BID   Follow up Recommendations  None    Frequency and Duration            Prognosis        Swallow Study   General HPI:  65 y.o. male with h/o acute hepatic encephalopathy, bleeding esophageal varices, DM type 2, depression, diabetic neuropathy, esophageal stricture, NASH cirrhosis, massive GI bleeds post  multiple endoscopies and ligations and severe protein-calorie malnutrition, who presented to ED with AMS and lethargy. CXR 2/20 mild basilar atelectasis, no pulmonary edema. BSE 10/26/2014 recommended regular diet with thin liquids. Previous Swallow Assessment: see HPI Respiratory Status: Room air History of Recent Intubation: No Behavior/Cognition: Alert;Cooperative;Pleasant mood Oral Cavity Assessment: Within Functional Limits Oral Care Completed by SLP: No Oral Cavity - Dentition: Adequate natural dentition Vision: Functional for self-feeding Self-Feeding Abilities: Able to feed self;Needs set up;Needs assist Patient Positioning: Upright in bed Baseline Vocal Quality: Normal Volitional Cough: Strong Volitional Swallow: Able to elicit    Oral/Motor/Sensory Function Overall Oral Motor/Sensory Function: Within functional limits   Ice Chips Ice chips: Not tested   Thin Liquid Thin Liquid: Within functional limits Presentation: Cup;Straw    Nectar Thick Nectar Thick Liquid: Not tested   Honey Thick Honey Thick Liquid: Not tested   Puree Puree: Within functional limits   Solid   GO   Solid: Within functional limits        Houston Siren 08/28/2015,12:50 PM   Orbie Pyo Kyle[employer] [phone]

## 2015-08-28 NOTE — Progress Notes (Signed)
Pt arrived from ED without complication. Bruises were noted on pt's left hip and back. Pt said the bruises developed after he fell last week. Pt states that he is having no pain in that area.

## 2015-08-29 DIAGNOSIS — K7290 Hepatic failure, unspecified without coma: Secondary | ICD-10-CM

## 2015-08-29 LAB — CBC WITH DIFFERENTIAL/PLATELET
BASOS ABS: 0 10*3/uL (ref 0.0–0.1)
Basophils Relative: 0 %
Eosinophils Absolute: 0.1 10*3/uL (ref 0.0–0.7)
Eosinophils Relative: 5 %
HEMATOCRIT: 27.9 % — AB (ref 39.0–52.0)
Hemoglobin: 9.6 g/dL — ABNORMAL LOW (ref 13.0–17.0)
LYMPHS PCT: 14 %
Lymphs Abs: 0.4 10*3/uL — ABNORMAL LOW (ref 0.7–4.0)
MCH: 30.7 pg (ref 26.0–34.0)
MCHC: 34.4 g/dL (ref 30.0–36.0)
MCV: 89.1 fL (ref 78.0–100.0)
MONO ABS: 0.4 10*3/uL (ref 0.1–1.0)
Monocytes Relative: 13 %
NEUTROS ABS: 2 10*3/uL (ref 1.7–7.7)
Neutrophils Relative %: 68 %
Platelets: 24 10*3/uL — CL (ref 150–400)
RBC: 3.13 MIL/uL — AB (ref 4.22–5.81)
RDW: 15.9 % — ABNORMAL HIGH (ref 11.5–15.5)
WBC: 2.9 10*3/uL — ABNORMAL LOW (ref 4.0–10.5)

## 2015-08-29 LAB — COMPREHENSIVE METABOLIC PANEL
ALBUMIN: 3 g/dL — AB (ref 3.5–5.0)
ALT: 26 U/L (ref 17–63)
AST: 32 U/L (ref 15–41)
Alkaline Phosphatase: 82 U/L (ref 38–126)
Anion gap: 10 (ref 5–15)
BUN: 46 mg/dL — AB (ref 6–20)
CHLORIDE: 104 mmol/L (ref 101–111)
CO2: 18 mmol/L — ABNORMAL LOW (ref 22–32)
Calcium: 9.4 mg/dL (ref 8.9–10.3)
Creatinine, Ser: 2.16 mg/dL — ABNORMAL HIGH (ref 0.61–1.24)
GFR calc Af Amer: 36 mL/min — ABNORMAL LOW (ref >60)
GFR, EST NON AFRICAN AMERICAN: 31 mL/min — AB (ref >60)
Glucose, Bld: 158 mg/dL — ABNORMAL HIGH (ref 65–99)
POTASSIUM: 4.8 mmol/L (ref 3.5–5.1)
Sodium: 132 mmol/L — ABNORMAL LOW (ref 135–145)
Total Bilirubin: 1.5 mg/dL — ABNORMAL HIGH (ref 0.3–1.2)
Total Protein: 5.5 g/dL — ABNORMAL LOW (ref 6.5–8.1)

## 2015-08-29 LAB — GLUCOSE, CAPILLARY
GLUCOSE-CAPILLARY: 243 mg/dL — AB (ref 65–99)
GLUCOSE-CAPILLARY: 295 mg/dL — AB (ref 65–99)
Glucose-Capillary: 165 mg/dL — ABNORMAL HIGH (ref 65–99)
Glucose-Capillary: 224 mg/dL — ABNORMAL HIGH (ref 65–99)

## 2015-08-29 LAB — AMMONIA: Ammonia: 85 umol/L — ABNORMAL HIGH (ref 9–35)

## 2015-08-29 LAB — PATHOLOGIST SMEAR REVIEW

## 2015-08-29 LAB — MAGNESIUM: MAGNESIUM: 2.4 mg/dL (ref 1.7–2.4)

## 2015-08-29 MED ORDER — LACTULOSE 10 GM/15ML PO SOLN
30.0000 g | Freq: Four times a day (QID) | ORAL | Status: DC
  Administered 2015-08-29 – 2015-08-31 (×7): 30 g via ORAL
  Filled 2015-08-29 (×7): qty 45

## 2015-08-29 MED ORDER — GABAPENTIN 100 MG PO CAPS
100.0000 mg | ORAL_CAPSULE | Freq: Three times a day (TID) | ORAL | Status: DC
  Administered 2015-08-29 – 2015-08-31 (×5): 100 mg via ORAL
  Filled 2015-08-29 (×5): qty 1

## 2015-08-29 NOTE — Evaluation (Signed)
Physical Therapy Evaluation Patient Details Name: [name] MRN: [id number] DOB: 1951-05-01 Today's Date: 08/29/2015   History of Present Illness  Pt adm with hepatic encephalopathy. PMH - NASH cirrhosis with history of recurrent hepatic encephalopathy, twice weekly 2-3L paracenteses, massive GI bleeds status post multiple endoscopies and ligations, epidural abcess, DM  Clinical Impression  Pt admitted with above diagnosis and presents to PT with functional limitations due to deficits listed below (See PT problem list). Pt needs skilled PT to maximize independence and safety to allow discharge to home with wife and hospice.      Follow Up Recommendations No PT follow up [facility])    Equipment Recommendations  None recommended by PT    Recommendations for Other Services       Precautions / Restrictions Precautions Precautions: Fall Restrictions Weight Bearing Restrictions: No      Mobility  Bed Mobility                  Transfers Overall transfer level: Needs assistance Equipment used: Rolling walker (2 wheeled) Transfers: Sit to/from Stand Sit to Stand: Min guard         General transfer comment: Verbal cues for hand placement and incr time  Ambulation/Gait Ambulation/Gait assistance: Min guard Ambulation Distance (Feet): 20 Feet Assistive device: Rolling walker (2 wheeled) Gait Pattern/deviations: Step-through pattern;Decreased stride length;Trunk flexed Gait velocity: decr Gait velocity interpretation: Below normal speed for age/gender General Gait Details: Slow gait but no loss of balance  Stairs            Wheelchair Mobility    Modified Rankin (Stroke Patients Only)       Balance Overall balance assessment: Needs assistance Sitting-balance support: No upper extremity supported;Feet supported Sitting balance-Leahy Scale: Fair     Standing balance support: Bilateral upper extremity supported Standing balance-Leahy Scale:  Poor Standing balance comment: walker and supervision for static standing                             Pertinent Vitals/Pain Pain Assessment: No/denies pain    Home Living Family/patient expects to be discharged to:: Private residence Living Arrangements: Spouse/significant other Available Help at Discharge: Family Type of Home: House Home Access: Stairs to enter Entrance Stairs-Rails: Right;Left;Can reach both Entrance Stairs-Number of Steps: 2 Home Layout: Multi-level;Able to live on main level with bedroom/bathroom Home Equipment: Gilford Rile - 2 wheels;Shower seat;Cane - single point;Bedside commode      Prior Function Level of Independence: Needs assistance   Gait / Transfers Assistance Needed: Has been amb with walker since hospital admission a few weeks ago.           Hand Dominance   Dominant Hand: Right    Extremity/Trunk Assessment   Upper Extremity Assessment: Defer to OT evaluation           Lower Extremity Assessment: Generalized weakness         Communication   Communication: No difficulties  Cognition Arousal/Alertness: Awake/alert Behavior During Therapy: WFL for tasks assessed/performed Overall Cognitive Status: Impaired/Different from baseline Area of Impairment: Problem solving             Problem Solving: Slow processing      General Comments      Exercises General Exercises - Lower Extremity Long Arc Quad: AROM;Both;10 reps;Seated Hip Flexion/Marching: AROM;10 reps;Seated      Assessment/Plan    PT Assessment Patient needs continued PT services  PT Diagnosis Generalized weakness;Difficulty walking  PT Problem List Decreased strength;Decreased activity tolerance;Decreased balance;Decreased mobility  PT Treatment Interventions DME instruction;Gait training;Functional mobility training;Therapeutic activities;Therapeutic exercise;Balance training;Patient/family education   PT Goals (Current goals can be found in the  Care Plan section) Acute Rehab PT Goals Patient Stated Goal: return home PT Goal Formulation: With patient Time For Goal Achievement: 09/05/15 Potential to Achieve Goals: Good    Frequency Min 3X/week   Barriers to discharge        Co-evaluation               End of Session Equipment Utilized During Treatment: Gait belt Activity Tolerance: Patient limited by fatigue Patient left: in chair;with call bell/phone within reach;with chair alarm set;with family/visitor present           Time: [id number] PT Time Calculation (min) (ACUTE ONLY): 17 min   Charges:   PT Evaluation $PT Eval Moderate Complexity: 1 Procedure     PT G Codes:        Zakery Normington 08/31/2015, 12:21 PM [facility] PT [phone]

## 2015-08-29 NOTE — Progress Notes (Signed)
PROGRESS NOTE  [name] [id number] DOB: [birth year] DOA: 08/27/2015 PCP: [name], MD Outpatient Specialists:    LOS: 2 days   Brief Narrative: 65 y.o. 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, pancytopenia with severe thrombocytopenia, CKD, prolonged hospital stay in 09/2014 due to epidural abscess at C3-C6 with group B strep on chronic suppressive amoxicillin for at least a year, and type 2 diabetes mellitus who presented on 2/20 with confusion and lethargy, felt to have hepatic encephalopathy  Assessment & Plan: Principal Problem:   Hepatic encephalopathy ([place]) Active Problems:   Thrombocytopenia ([place])   Acute hepatic encephalopathy (HCC)   Ascites   Acute renal failure superimposed on stage 3 chronic kidney disease (HCC)   Anemia of chronic disease   Protein-calorie malnutrition, severe   Hepatic cirrhosis (HCC)   Type 2 diabetes mellitus ([place])   Palliative care encounter   Acute hepatic encephalopathy ([place]) due to NASH, which has recurred despite medication compliance and 2 large BMs daily.No evidence of infection or hemorrhage as a trigger, however, I worry that his disease is progressing. - Ammonia 128 on admission and trended down to 71 - His mental status is significantly improved, however still confused some -Continue to hold baclofen, gabapentin -UA negative and CXR without evidence of pneumonia -Appreciate radiology assistance for paracentesis: 3L removed, neg for evid of SBP. May need repeat paracentesis in 1-2 days -Hemoglobin stable -SLP: Cleared for regular diet -Resume home dose medications for now -Palliative care consultation following -PT/OT assessment. Plan for either home with hospice or to SNF with palliative care   Goals of care - Long discussion with patient  and patient's wife today, they would like to know whether he is a transplant candidate. I'm not sure I have the answer to that question, I reviewed patient's medical records at [facility] where he is followed for transplant hepatology, and everything is now on a holding pattern due to his infection and he will need to see them again after he finishes his antibiotics and he is cleared by infectious disease for his neck infection. I discussed with GI as well briefly, if he is to get a drain for any ascites they'll completely get him off the transplant list. I will discuss again with patient tomorrow, if they want hope that at one point he is on the transplant list, we'll have to hold placement of a peritoneal drain  Esophageal varices ([place]) / ESOPHAGEAL STRICTURE - change to oral protonix  Thrombocytopenia ([place]) / Coagulapathy, trending down slightly - LIkely due to liver cirrhosis, however more severe than typical  Ascites s/p US guided paracentesis on 2/21 - We'll see if we can get a repeat paracentesis tomorrow or day after  Acute renal failure superimposed on stage 3 chronic kidney disease ([place]), baseline creatinine of 1-1.2, but creatinine gradually increasing over last several months due to 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 Tommy Medal post-discharge  Severe protein calorie malnutrition - Liberalize diet - Supplements   DVT prophylaxis:  SCD Code Status: DNR Family Communication: d/w wife bedside Disposition Plan: home vs SNF Barriers for discharge: mental status changes  Consultants:   Palliative  Procedures:   None   Antimicrobials:  Amoxicillin - long term   Subjective: - He endorses abdominal distention, however is feeling relatively well, appears clear for mental status standpoint  Objective: Filed Vitals:   08/28/15 1237 08/28/15 1509 08/28/15 2013 08/29/15 0542  BP: 98/68 103/48 112/67 106/58  Pulse: 66 72 73 71  Temp: 97.4 F (36.3 C) 97.4 F (36.3 C) 98.6 F (37 C) 98.3 F (36.8 C)  TempSrc: Oral Axillary Oral Oral  Resp: 10 13 18 16   Height:      Weight:      SpO2: 100% 100% 100%     Intake/Output Summary (Last 24 hours) at 08/29/15 1655 Last data filed at 08/29/15 0615  Gross per 24 hour  Intake    220 ml  Output    240 ml  Net    -20 ml   Filed Weights   08/28/15 0347  Weight: 84.7 kg (186 lb 11.7 oz)    Examination: BP 106/58 mmHg  Pulse 71  Temp(Src) 98.3 F (36.8 C) (Oral)  Resp 16  Ht 5\' 10"  (1.778 m)  Wt 84.7 kg (186 lb 11.7 oz)  BMI 26.79 kg/m2  SpO2 100% General exam: NAD Respiratory system: Clear. No increased work of breathing. No wheezing, no crackles Cardiovascular system: regular rate and rhythm, no murmurs, gallops. No JVD. No peripheral edema.  Gastrointestinal system: Abdomen is distended, ascites present, dull to percution. Normal bowel sounds heard. Central nervous system: AxOx2-3. No focal deficits. Minimal asterixis Extremities: No clubbing/cyanosis Skin: no rashes   Data Reviewed: I have personally reviewed following labs and imaging studies  CBC:  Recent Labs Lab 08/27/15 1510 08/28/15 0321 08/29/15 0702  WBC 3.2* 2.7* 2.9*  NEUTROABS  --  1.8 2.0  HGB 9.9* 9.9* 9.6*  HCT 29.3* 28.4* 27.9*  MCV 87.5 88.2 89.1  PLT 22* 18* 24*   Basic Metabolic Panel:  Recent Labs Lab 08/27/15 1510 08/28/15 0321 08/29/15 0702  NA 132* 134* 132*   K 4.5 4.2 4.8  CL 106 109 104  CO2 18* 16* 18*  GLUCOSE 294* 156* 158*  BUN 46* 46* 46*  CREATININE 2.16* 2.12* 2.16*  CALCIUM 9.8 9.5 9.4  MG  --  2.7* 2.4   GFR: Estimated Creatinine Clearance: 36.6 mL/min (by C-G formula based on Cr of 2.16). Liver Function Tests:  Recent Labs Lab 08/27/15 1510 08/28/15 0321 08/29/15 0702  AST 51* 34 32  ALT 28 26 26   ALKPHOS 89 79 82  BILITOT 1.3* 1.5* 1.5*  PROT 6.3* 5.6* 5.5*  ALBUMIN 3.6 3.3* 3.0*   No results for input(s): LIPASE, AMYLASE in the last 168 hours.  Recent Labs Lab 08/27/15 1523 08/28/15 0321 08/29/15 0702  AMMONIA 128* 71* 85*   Coagulation Profile: No results for input(s): INR, PROTIME in the last 168 hours. Cardiac Enzymes: No results for input(s): CKTOTAL, CKMB, CKMBINDEX, TROPONINI in the last 168 hours. BNP (last 3 results) No results for input(s): PROBNP in the last 8760 hours. HbA1C: No results for input(s): HGBA1C in the last 72 hours. CBG:  Recent Labs Lab 08/28/15 1240 08/28/15 1603 08/28/15 2010 08/29/15 0816 08/29/15 1220  GLUCAP 226* 204* 188* 165* 243*   Lipid Profile: No  results for input(s): CHOL, HDL, LDLCALC, TRIG, CHOLHDL, LDLDIRECT in the last 72 hours. Thyroid Function Tests: No results for input(s): TSH, T4TOTAL, FREET4, T3FREE, THYROIDAB in the last 72 hours. Anemia Panel: No results for input(s): VITAMINB12, FOLATE, FERRITIN, TIBC, IRON, RETICCTPCT in the last 72 hours. Urine analysis:    Component Value Date/Time   COLORURINE YELLOW 08/27/2015 1820   APPEARANCEUR CLEAR 08/27/2015 1820   LABSPEC 1.022 08/27/2015 1820   PHURINE 5.5 08/27/2015 1820   GLUCOSEU NEGATIVE 08/27/2015 1820   GLUCOSEU NEGATIVE 08/22/2015 1544   HGBUR NEGATIVE 08/27/2015 1820   BILIRUBINUR NEGATIVE 08/27/2015 1820   KETONESUR 15* 08/27/2015 1820   PROTEINUR NEGATIVE 08/27/2015 1820   UROBILINOGEN 0.2 08/22/2015 1544   NITRITE NEGATIVE 08/27/2015 1820   LEUKOCYTESUR NEGATIVE 08/27/2015 1820    Sepsis Labs: Invalid input(s): PROCALCITONIN, LACTICIDVEN  Recent Results (from the past 240 hour(s))  Urine Culture     Status: None   Collection Time: 08/22/15  3:38 PM  Result Value Ref Range Status   Colony Count NO GROWTH  Final   Organism ID, Bacteria NO GROWTH  Final  MRSA PCR Screening     Status: None   Collection Time: 08/28/15  3:52 AM  Result Value Ref Range Status   MRSA by PCR NEGATIVE NEGATIVE Final    Comment:        The GeneXpert MRSA Assay (FDA approved for NASAL specimens only), is one component of a comprehensive MRSA colonization surveillance program. It is not intended to diagnose MRSA infection nor to guide or monitor treatment for MRSA infections.   Culture, body fluid-bottle     Status: None (Preliminary result)   Collection Time: 08/28/15 10:05 AM  Result Value Ref Range Status   Specimen Description FLUID ASCITIC  Final   Special Requests NONE  Final   Culture NO GROWTH 1 DAY  Final   Report Status PENDING  Incomplete  Gram stain     Status: None   Collection Time: 08/28/15 10:05 AM  Result Value Ref Range Status   Specimen Description FLUID ASCITIC  Final   Special Requests NONE  Final   Gram Stain   Final    RARE WBC PRESENT,BOTH PMN AND MONONUCLEAR NO ORGANISMS SEEN    Report Status 08/28/2015 FINAL  Final      Radiology Studies: US Paracentesis  08/28/2015  INDICATION: 65 year old male with a history of ascites. Multiple prior paracentesis. Request for no more than 3.0 L aspiration. EXAM: ULTRASOUND GUIDED  PARACENTESIS MEDICATIONS: None. COMPLICATIONS: None PROCEDURE: Informed written consent was obtained from the patient after a discussion of the risks, benefits and alternatives to treatment. A timeout was performed prior to the initiation of the procedure. Initial ultrasound scanning demonstrates a large amount of ascites within the right lower abdominal quadrant. The right lower abdomen was prepped and draped in the usual sterile  fashion. 1% lidocaine with epinephrine was used for local anesthesia. Following this, a yueh catheter was introduced. An ultrasound image was saved for documentation purposes. The paracentesis was performed. The catheter was removed and a dressing was applied. The patient tolerated the procedure well without immediate post procedural complication. FINDINGS: A total of approximately 3.0 L of thin yellow fluid was removed. Samples were sent to the laboratory as requested by the clinical team. IMPRESSION: Status post ultrasound-guided paracentesis with 3.0 L fluid removed. Sample sent to the lab for analysis. Signed, Dulcy Fanny. Earleen Newport, DO [facility] Radiology Electronically Signed   By:  Corrie Mckusick D.O.   On: 08/28/2015 10:25     Scheduled Meds: . amoxicillin  500 mg Oral TID  . feeding supplement (PRO-STAT SUGAR FREE 64)  30 mL Oral TID  . fluconazole  100 mg Oral Daily  . FLUoxetine  10 mg Oral Daily  . insulin aspart  0-9 Units Subcutaneous TID WC  . lactulose  30 g Oral TID  . pantoprazole  40 mg Oral Daily  . rifaximin  550 mg Oral BID  . tamsulosin  0.4 mg Oral BID   Continuous Infusions:     Marzetta Board, MD, PhD [facility] Pager [phone]  If 7PM-7AM, please contact night-coverage [email or web address] Password [facility] 08/29/2015, 4:55 PM

## 2015-08-29 NOTE — Progress Notes (Signed)
   08/29/15 1203  Clinical Encounter Type  Visited With Patient;Family  Visit Type Spiritual support  Referral From Other (Comment) (P.A. on floor)  Spiritual Encounters  Spiritual Needs Emotional  Stress Factors  Patient Stress Factors Major life changes;Health changes;Loss  Family Stress Factors Major life changes;Loss  P.A. On floor noted patient's wife crying in hallway and contacted SCW office. Chaplain responded immediately and found wife in patient's room. She indicated that she did not want to talk but asked me to talk to husband. I did, for about 30 minutes. Patient has not yet fully absorbed the news that he is at end-of-life. Conversation would touch on it but immediately veer to trivialities about pets and neighbors. Hope to return on Friday. Harumi Yamin, Chaplain

## 2015-08-29 NOTE — Care Management Note (Signed)
Case Management Note  Patient Details  Name: SHADEN HAUGHT MRN: [id number] Date of Birth: 06/05/1951  Subjective/Objective:     Pt lives with spouse who requests home hospice referral.  Provided list of [facility] hospice agencies and made referral to [facility].  Demographic sheet, history and physical, and most recent progress note faxed to [facility] with [facility].  Per spouse, pt has all needed equipment - she feels he does not need a hospital bed at this time.  CM also provided list of private duty agencies per request.                     Expected Discharge Plan:  [place]  Discharge planning Services  CM Consult  Choice offered to:  Spouse  HH Arranged:  RN, Social Work, Nurse's Aide [place] Agency:  [facility]  Status of Service:  In process, will continue to follow  Girard Cooter, RN 08/29/2015, 10:56 AM

## 2015-08-29 NOTE — Progress Notes (Signed)
Daily Progress Note   Patient Name: [name]       Date: 08/29/2015 DOB: 06-Jun-1951  Age: 65 y.o. MRN#: [id number] Attending Physician: Caren Griffins, MD Primary Care Physician: [name], MD Admit Date: 08/27/2015  Reason for Consultation/Follow-up: Establishing goals of care  Subjective: I met again today with [name] and his wife. He was too lethargic and did not participate in our conversation yesterday. He is more alert today and we explore the thought that some of his providers feel he may not be a candidate for transplant at this point. We discuss that this does mean that options are limited. He tells [place] that he worries about his family (wife) and missing out on his young grandchildren. We discussed limited options without transplant and he understands that this means he will get worse and this will lead to his death. He says he never thought it would progress so quickly and that he would suffer as much as he has. We also discussed the possible option of drain to manage ascites and hospice at home. He seems to understand these options but needs too hear from GI as they greatly value [name] Media opinion. Primary to call.   They need answered by GI:  1) Is liver transplant no longer a realistic option? (they have been told this is likely not a realistic goal) 2) Consider drain to manage ascites  If no transplant they wish to go home with hospice. He does often transition conversation into telling me stories about his friends, etc. I will follow up tomorrow.   Length of Stay: 2 days  Current Medications: Scheduled Meds:  . amoxicillin  500 mg Oral TID  . feeding supplement (PRO-STAT SUGAR FREE 64)  30 mL Oral TID  . fluconazole  100 mg Oral Daily  . FLUoxetine  10 mg Oral  Daily  . insulin aspart  0-9 Units Subcutaneous TID WC  . lactulose  30 g Oral TID  . pantoprazole  40 mg Oral Daily  . rifaximin  550 mg Oral BID  . tamsulosin  0.4 mg Oral BID    Continuous Infusions:    PRN Meds:   Physical Exam: Physical Exam  Constitutional: He is oriented to person, place, and time. He appears well-developed.  HENT:  Head: Normocephalic and atraumatic.  Cardiovascular: Normal rate.   Pulmonary/Chest: Effort normal. No accessory muscle usage. No tachypnea. No respiratory distress.  Abdominal: He exhibits ascites.  Neurological: He is alert and oriented to person, place, and time.                Vital Signs: BP 106/58 mmHg  Pulse 71  Temp(Src) 98.3 F (36.8 C) (Oral)  Resp 16  Ht '5\' 10"'$  (1.778 m)  Wt 84.7 kg (186 lb 11.7 oz)  BMI 26.79 kg/m2  SpO2 100% SpO2: SpO2: 100 % O2 Device: O2 Device: Not Delivered O2 Flow Rate:    Intake/output summary:  Intake/Output Summary (Last 24 hours) at 08/29/15 1038 Last data filed at 08/29/15 0615  Gross per 24 hour  Intake    850 ml  Output    240 ml  Net    610 ml   LBM: Last BM Date: 08/28/15 Baseline Weight: Weight: 84.7 kg (186 lb 11.7 oz) Most recent weight: Weight: 84.7 kg (186 lb 11.7 oz)       Palliative Assessment/Data: Flowsheet Rows        Most Recent Value   Intake Tab    Referral Department  Hospitalist   Unit at Time of Referral  ER   Palliative Care Primary Diagnosis  Other (Comment) [cirrhosis]   Date Notified  08/27/15   Palliative Care Type  New Palliative care   Reason for referral  Clarify Goals of Care   Date of Admission  08/27/15   # of days IP prior to Palliative referral  0   Clinical Assessment    Psychosocial & Spiritual Assessment    Palliative Care Outcomes       Additional Data Reviewed: CBC    Component Value Date/Time   WBC 2.9* 08/29/2015 0702   RBC 3.13* 08/29/2015 0702   HGB 9.6* 08/29/2015 0702   HCT 27.9* 08/29/2015 0702   PLT 24* 08/29/2015 0702     MCV 89.1 08/29/2015 0702   MCH 30.7 08/29/2015 0702   MCHC 34.4 08/29/2015 0702   RDW 15.9* 08/29/2015 0702   LYMPHSABS 0.4* 08/29/2015 0702   MONOABS 0.4 08/29/2015 0702   EOSABS 0.1 08/29/2015 0702   BASOSABS 0.0 08/29/2015 0702    CMP     Component Value Date/Time   NA 132* 08/29/2015 0702   K 4.8 08/29/2015 0702   CL 104 08/29/2015 0702   CO2 18* 08/29/2015 0702   GLUCOSE 158* 08/29/2015 0702   BUN 46* 08/29/2015 0702   CREATININE 2.16* 08/29/2015 0702   CREATININE 2.21* 08/13/2015 1530   CALCIUM 9.4 08/29/2015 0702   PROT 5.5* 08/29/2015 0702   ALBUMIN 3.0* 08/29/2015 0702   AST 32 08/29/2015 0702   ALT 26 08/29/2015 0702   ALKPHOS 82 08/29/2015 0702   BILITOT 1.5* 08/29/2015 0702   GFRNONAA 31* 08/29/2015 0702   GFRNONAA 31* 08/13/2015 1530   GFRAA 36* 08/29/2015 0702   GFRAA 36* 08/13/2015 1530       Problem List:  Patient Active Problem List   Diagnosis Date Noted  . Palliative care encounter 08/28/2015  . Hepatic encephalopathy ([place]) 08/27/2015  . Extradural and subdural abscess, 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/2015  . Myelitis ([place]) 08/13/2015  . Abscess in epidural space of cervical spine 08/13/2015  . Secondary biliary cirrhosis ([place])   . Protein-calorie malnutrition, severe 07/26/2015  . Ascites 07/25/2015  . Acute renal failure superimposed on stage 3 chronic kidney disease ([place]) 07/25/2015  . Anemia of chronic disease 07/25/2015  . Depression 07/25/2015  .  Controlled type 2 diabetes mellitus with diabetic nephropathy, without long-term current use of insulin ([place]) 07/25/2015  . Diabetic neuropathy ([place]) 07/25/2015  . Coagulopathy ([place]) 07/25/2015  . NASH (nonalcoholic steatohepatitis) 07/25/2015  . Body mass index (BMI) of 26.0-26.9 in adult 05/10/2015  . Other ascites 04/25/2015  . Acute hepatic encephalopathy ([place]) 01/30/2015  . Thrombocytopenia ([place]) 10/22/2014  . Bleeding esophageal varices ([place]) 10/27/2012  . Hepatic cirrhosis ([place]) 07/12/2012  .  Obesity, unspecified 07/12/2012  . Portal hypertension ([place]) 07/12/2012  . Type 2 diabetes mellitus ([place]) 07/12/2012  . ESOPHAGEAL STRICTURE 09/23/2004     Palliative Care Assessment & Plan    1.Code Status:  DNR    Code Status Orders        Start     Ordered   08/27/15 1948  Do not attempt resuscitation (DNR)   Continuous    Question Answer Comment  In the event of cardiac or respiratory ARREST Do not call a "code blue"   In the event of cardiac or respiratory ARREST Do not perform Intubation, CPR, defibrillation or ACLS   In the event of cardiac or respiratory ARREST Use medication by any route, position, wound care, and other measures to relive pain and suffering. May use oxygen, suction and manual treatment of airway obstruction as needed for comfort.      08/27/15 1947    Code Status History    Date Active Date Inactive Code Status Order ID Comments User Context   08/05/2015 12:24 AM 08/05/2015  6:53 PM Full Code [id number]  Etta Quill, DO ED   07/25/2015  8:49 PM 07/31/2015  7:23 PM Full Code [id number]  Robbie Lis, MD Inpatient   07/10/2015  8:06 PM 07/13/2015  2:36 PM DNR [id number]  Domenic Polite, MD Inpatient   04/26/2015  6:38 PM 04/28/2015  4:22 PM DNR [id number]  Eugenie Filler, MD ED   01/30/2015  5:40 PM 01/31/2015  7:33 PM Full Code [id number]  Nita Sells, MD Inpatient   10/25/2014  6:24 PM 11/11/2014  2:26 PM Full Code [id number]  Bary Leriche, PA-C Inpatient   10/20/2014 11:03 PM 10/25/2014  6:24 PM Full Code [id number]  Jani Gravel, MD Inpatient    Advance Directive Documentation        Most Recent Value   Type of Advance Directive  Healthcare Power of Attorney   Pre-existing out of facility DNR order (yellow form or pink MOST form)     "MOST" Form in Place?         2. Goals of Care/Additional Recommendations:  He needs to know if liver transplant is no longer an option and input from GI. If not than they will opt for home with hospice which will help  improve QOL and assist them in keeping him home.  Desire for further Chaplaincy support:yes  Psycho-social Needs: Caregiving  Support/Resources and Education on Hospice  3. Symptom Management:  Encephalopathy: Continue lactulose.   Ascites: Consider PleurX to manage ascites at home.  4. Palliative Prophylaxis:   Bowel Regimen, Delirium Protocol and Frequent Pain Assessment  5. Prognosis: < 6 months  6. Discharge Planning:  Likely home with hospice.    Thank you for allowing the Palliative Medicine Team to assist in the care of this patient.   Time In: 0950 Time Out: 1040 Total Time 53mn Prolonged Time Billed  no         APershing Proud NP  24/62/7035 10:38 AM  Please contact Palliative Medicine  Team phone at [phone] for questions and concerns.

## 2015-08-30 ENCOUNTER — Ambulatory Visit (HOSPITAL_COMMUNITY): Admission: RE | Admit: 2015-08-30 | Payer: Managed Care, Other (non HMO) | Source: Ambulatory Visit

## 2015-08-30 ENCOUNTER — Inpatient Hospital Stay (HOSPITAL_COMMUNITY): Payer: Managed Care, Other (non HMO)

## 2015-08-30 LAB — CBC WITH DIFFERENTIAL/PLATELET
BASOS PCT: 0 %
Basophils Absolute: 0 10*3/uL (ref 0.0–0.1)
EOS ABS: 0.1 10*3/uL (ref 0.0–0.7)
Eosinophils Relative: 4 %
HEMATOCRIT: 29 % — AB (ref 39.0–52.0)
HEMOGLOBIN: 9.7 g/dL — AB (ref 13.0–17.0)
LYMPHS ABS: 0.3 10*3/uL — AB (ref 0.7–4.0)
Lymphocytes Relative: 11 %
MCH: 29.4 pg (ref 26.0–34.0)
MCHC: 33.4 g/dL (ref 30.0–36.0)
MCV: 87.9 fL (ref 78.0–100.0)
MONO ABS: 0.6 10*3/uL (ref 0.1–1.0)
MONOS PCT: 22 %
NEUTROS PCT: 63 %
Neutro Abs: 1.8 10*3/uL (ref 1.7–7.7)
Platelets: 21 10*3/uL — CL (ref 150–400)
RBC: 3.3 MIL/uL — ABNORMAL LOW (ref 4.22–5.81)
RDW: 15.6 % — AB (ref 11.5–15.5)
WBC: 2.9 10*3/uL — ABNORMAL LOW (ref 4.0–10.5)

## 2015-08-30 LAB — COMPREHENSIVE METABOLIC PANEL
ALBUMIN: 3 g/dL — AB (ref 3.5–5.0)
ALK PHOS: 94 U/L (ref 38–126)
ALT: 26 U/L (ref 17–63)
AST: 32 U/L (ref 15–41)
Anion gap: 6 (ref 5–15)
BILIRUBIN TOTAL: 1.1 mg/dL (ref 0.3–1.2)
BUN: 48 mg/dL — AB (ref 6–20)
CALCIUM: 9.2 mg/dL (ref 8.9–10.3)
CO2: 20 mmol/L — AB (ref 22–32)
Chloride: 104 mmol/L (ref 101–111)
Creatinine, Ser: 2.11 mg/dL — ABNORMAL HIGH (ref 0.61–1.24)
GFR calc Af Amer: 37 mL/min — ABNORMAL LOW (ref >60)
GFR calc non Af Amer: 32 mL/min — ABNORMAL LOW (ref >60)
GLUCOSE: 261 mg/dL — AB (ref 65–99)
Potassium: 5.1 mmol/L (ref 3.5–5.1)
SODIUM: 130 mmol/L — AB (ref 135–145)
TOTAL PROTEIN: 5.7 g/dL — AB (ref 6.5–8.1)

## 2015-08-30 LAB — GLUCOSE, CAPILLARY
GLUCOSE-CAPILLARY: 222 mg/dL — AB (ref 65–99)
GLUCOSE-CAPILLARY: 209 mg/dL — AB (ref 65–99)
GLUCOSE-CAPILLARY: 261 mg/dL — AB (ref 65–99)
Glucose-Capillary: 244 mg/dL — ABNORMAL HIGH (ref 65–99)

## 2015-08-30 LAB — MAGNESIUM: Magnesium: 2.4 mg/dL (ref 1.7–2.4)

## 2015-08-30 LAB — AMMONIA: AMMONIA: 51 umol/L — AB (ref 9–35)

## 2015-08-30 MED ORDER — LIDOCAINE HCL (PF) 1 % IJ SOLN
INTRAMUSCULAR | Status: AC
  Filled 2015-08-30: qty 10

## 2015-08-30 NOTE — Progress Notes (Signed)
Daily Progress Note   Patient Name: [name]       Date: 08/30/2015 DOB: Oct 09, 1950  Age: 65 y.o. MRN#: [id number] Attending Physician: Caren Griffins, MD Primary Care Physician: [name], MD Admit Date: 08/27/2015  Reason for Consultation/Follow-up: Establishing goals of care  Subjective: I spoke privately with [name] who is frustrated with the messages to them that he is not a liver transplant candidate (she has resigned herself to the fact that he is approaching end of life). She is afraid that he is hanging on too much to a liver transplant.  When [name] came out of the restroom we discussed this further. We discussed how liver transplant is very unlikely at this point as he seems to be continuing to decline and I fear timing will be an issue. He understands that he will die from this without transplant. He wishes to follow up with liver specialist regarding transplant March 15. After this they are hoping to have more answers and be able to move forward better. They would still like the assistance from hospice in the mean time (I have spoken with hospice and they are evaluating if they can accept him). They would like to move forward with drain if transplant if not an option after his [facility] at [facility].   Length of Stay: 3 days  Current Medications: Scheduled Meds:  . amoxicillin  500 mg Oral TID  . feeding supplement (PRO-STAT SUGAR FREE 64)  30 mL Oral TID  . fluconazole  100 mg Oral Daily  . FLUoxetine  10 mg Oral Daily  . gabapentin  100 mg Oral TID  . insulin aspart  0-9 Units Subcutaneous TID WC  . lactulose  30 g Oral Q6H  . lidocaine (PF)      . pantoprazole  40 mg Oral Daily  . rifaximin  550 mg Oral BID  . tamsulosin  0.4 mg Oral BID    Continuous  Infusions:    PRN Meds:   Physical Exam: Physical Exam  Constitutional: He is oriented to person, place, and time. He appears well-developed.  HENT:  Head: Normocephalic and atraumatic.  Cardiovascular: Normal rate.   Pulmonary/Chest: Effort normal. No accessory muscle usage. No tachypnea. No respiratory distress.  Abdominal: He exhibits ascites.  Neurological: He is alert and oriented  to person, place, and time.                Vital Signs: BP 105/57 mmHg  Pulse 82  Temp(Src) 98.7 F (37.1 C) (Oral)  Resp 18  Ht 5\' 10"  (1.778 m)  Wt 84.7 kg (186 lb 11.7 oz)  BMI 26.79 kg/m2  SpO2 100% SpO2: SpO2: 100 % O2 Device: O2 Device: Not Delivered O2 Flow Rate:    Intake/output summary:   Intake/Output Summary (Last 24 hours) at 08/30/15 1652 Last data filed at 08/30/15 1500  Gross per 24 hour  Intake    240 ml  Output      0 ml  Net    240 ml   LBM: Last BM Date: 08/29/15 Baseline Weight: Weight: 84.7 kg (186 lb 11.7 oz) Most recent weight: Weight: 84.7 kg (186 lb 11.7 oz)       Palliative Assessment/Data: Flowsheet Rows        Most Recent Value   Intake Tab    Referral Department  Hospitalist   Unit at Time of Referral  ER   Palliative Care Primary Diagnosis  Other (Comment) [cirrhosis]   Date Notified  08/27/15   Palliative Care Type  New Palliative care   Reason for referral  Clarify Goals of Care   Date of Admission  08/27/15   # of days IP prior to Palliative referral  0   Clinical Assessment    Psychosocial & Spiritual Assessment    Palliative Care Outcomes       Additional Data Reviewed: CBC    Component Value Date/Time   WBC 2.9* 08/30/2015 0557   RBC 3.30* 08/30/2015 0557   HGB 9.7* 08/30/2015 0557   HCT 29.0* 08/30/2015 0557   PLT 21* 08/30/2015 0557   MCV 87.9 08/30/2015 0557   MCH 29.4 08/30/2015 0557   MCHC 33.4 08/30/2015 0557   RDW 15.6* 08/30/2015 0557   LYMPHSABS 0.3* 08/30/2015 0557   MONOABS 0.6 08/30/2015 0557   EOSABS 0.1  08/30/2015 0557   BASOSABS 0.0 08/30/2015 0557    CMP     Component Value Date/Time   NA 130* 08/30/2015 0557   K 5.1 08/30/2015 0557   CL 104 08/30/2015 0557   CO2 20* 08/30/2015 0557   GLUCOSE 261* 08/30/2015 0557   BUN 48* 08/30/2015 0557   CREATININE 2.11* 08/30/2015 0557   CREATININE 2.21* 08/13/2015 1530   CALCIUM 9.2 08/30/2015 0557   PROT 5.7* 08/30/2015 0557   ALBUMIN 3.0* 08/30/2015 0557   AST 32 08/30/2015 0557   ALT 26 08/30/2015 0557   ALKPHOS 94 08/30/2015 0557   BILITOT 1.1 08/30/2015 0557   GFRNONAA 32* 08/30/2015 0557   GFRNONAA 31* 08/13/2015 1530   GFRAA 37* 08/30/2015 0557   GFRAA 36* 08/13/2015 1530       Problem List:  Patient Active Problem List   Diagnosis Date Noted  . Palliative care encounter 08/28/2015  . Hepatic encephalopathy ([place]) 08/27/2015  . Extradural and subdural abscess, 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/2015  . Myelitis ([place]) 08/13/2015  . Abscess in epidural space of cervical spine 08/13/2015  . Secondary biliary cirrhosis ([place])   . Protein-calorie malnutrition, severe 07/26/2015  . Ascites 07/25/2015  . Acute renal failure superimposed on stage 3 chronic kidney disease ([place]) 07/25/2015  . Anemia of chronic disease 07/25/2015  . Depression 07/25/2015  . Controlled type 2 diabetes mellitus with diabetic nephropathy, without long-term current use of insulin ([place]) 07/25/2015  . Diabetic neuropathy ([place]) 07/25/2015  . Coagulopathy (  [place]) 07/25/2015  . NASH (nonalcoholic steatohepatitis) 07/25/2015  . Body mass index (BMI) of 26.0-26.9 in adult 05/10/2015  . Other ascites 04/25/2015  . Acute hepatic encephalopathy ([place]) 01/30/2015  . Thrombocytopenia ([place]) 10/22/2014  . Bleeding esophageal varices ([place]) 10/27/2012  . Hepatic cirrhosis ([place]) 07/12/2012  . Obesity, unspecified 07/12/2012  . Portal hypertension ([place]) 07/12/2012  . Type 2 diabetes mellitus ([place]) 07/12/2012  . ESOPHAGEAL STRICTURE 09/23/2004     Palliative Care Assessment &  Plan    1.Code Status:  DNR    Code Status Orders        Start     Ordered   08/27/15 1948  Do not attempt resuscitation (DNR)   Continuous    Question Answer Comment  In the event of cardiac or respiratory ARREST Do not call a "code blue"   In the event of cardiac or respiratory ARREST Do not perform Intubation, CPR, defibrillation or ACLS   In the event of cardiac or respiratory ARREST Use medication by any route, position, wound care, and other measures to relive pain and suffering. May use oxygen, suction and manual treatment of airway obstruction as needed for comfort.      08/27/15 1947    Code Status History    Date Active Date Inactive Code Status Order ID Comments User Context   08/05/2015 12:24 AM 08/05/2015  6:53 PM Full Code [id number]  Etta Quill, DO ED   07/25/2015  8:49 PM 07/31/2015  7:23 PM Full Code [id number]  Robbie Lis, MD Inpatient   07/10/2015  8:06 PM 07/13/2015  2:36 PM DNR [id number]  Domenic Polite, MD Inpatient   04/26/2015  6:38 PM 04/28/2015  4:22 PM DNR [id number]  Eugenie Filler, MD ED   01/30/2015  5:40 PM 01/31/2015  7:33 PM Full Code [id number]  Nita Sells, MD Inpatient   10/25/2014  6:24 PM 11/11/2014  2:26 PM Full Code [id number]  Bary Leriche, PA-C Inpatient   10/20/2014 11:03 PM 10/25/2014  6:24 PM Full Code [id number]  Jani Gravel, MD Inpatient    Advance Directive Documentation        Most Recent Value   Type of Advance Directive  Healthcare Power of Attorney   Pre-existing out of facility DNR order (yellow form or pink MOST form)     "MOST" Form in Place?         2. Goals of Care/Additional Recommendations:  He needs to know if liver transplant is no longer an option. If not than they will opt for more comfort measures with peritoneal drain which will help improve QOL and assist them in managing him at home.  Desire for further Chaplaincy support:yes  Psycho-social Needs: Caregiving  Support/Resources and Education on Hospice  3.  Symptom Management:  Encephalopathy: Continue lactulose.   Ascites: Consider peritoneal drain to manage ascites at home.  4. Palliative Prophylaxis:   Bowel Regimen, Delirium Protocol and Frequent Pain Assessment  5. Prognosis: < 6 months  6. Discharge Planning:  Likely home with hospice.    Thank you for allowing the Palliative Medicine Team to assist in the care of this patient.   Time In: 1050 Time Out: 1130 Total Time 68min Prolonged Time Billed  no         Pershing Proud, NP  [id number], 4:52 PM  Please contact Palliative Medicine Team phone at [phone] for questions and concerns.

## 2015-08-30 NOTE — Progress Notes (Signed)
Inpatient Diabetes Program Recommendations  AACE/ADA: New Consensus Statement on Inpatient Glycemic Control (2015)  Target Ranges:  Prepandial:   less than 140 mg/dL      Peak postprandial:   less than 180 mg/dL (1-2 hours)      Critically ill patients:  140 - 180 mg/dL   Results for [name], [name] (MRN [id number]) as of 08/30/2015 10:41  Ref. Range 08/29/2015 08:16 08/29/2015 12:20 08/29/2015 17:21 08/29/2015 23:06 08/30/2015 08:17  Glucose-Capillary Latest Ref Range: 65-99 mg/dL 165 (H) 243 (H) 224 (H) 295 (H) 222 (H)   Review of Glycemic Control  Diabetes history: DM 2 Outpatient Diabetes medications: Tresiba 20 units Daily Current orders for Inpatient glycemic control: Novolog Sensitive TID  Inpatient Diabetes Program Recommendations: Correction (SSI): Glucose consistently in 200's. Please consider increasing correction to Novolog Moderate TID and adding HS scale. May need to start low dose basal insulin while inpatient since patient takes [place] at home.  Thanks,  Tama Headings RN, MSN, [facility] [place] Inpatient Diabetes Coordinator Team Pager [phone] (8a-5p)

## 2015-08-30 NOTE — Progress Notes (Signed)
PROGRESS NOTE  [name] [id number] DOB: 1951/06/10 DOA: 08/27/2015 PCP: [name], MD Outpatient Specialists:    LOS: 3 days   Brief Narrative: 65 y.o. 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, pancytopenia with severe thrombocytopenia, CKD, prolonged hospital stay in 09/2014 due to epidural abscess at C3-C6 with group B strep on chronic suppressive amoxicillin for at least a year, and type 2 diabetes mellitus who presented on 2/20 with confusion and lethargy, felt to have hepatic encephalopathy  Assessment & Plan: Principal Problem:   Hepatic encephalopathy ([place]) Active Problems:   Thrombocytopenia ([place])   Acute hepatic encephalopathy (HCC)   Ascites   Acute renal failure superimposed on stage 3 chronic kidney disease (HCC)   Anemia of chronic disease   Protein-calorie malnutrition, severe   Hepatic cirrhosis (HCC)   Type 2 diabetes mellitus ([place])   Palliative care encounter   Acute hepatic encephalopathy ([place]) due to NASH, which has recurred despite medication compliance and 2 large BMs daily.No evidence of infection or hemorrhage as a trigger, however, I worry that his disease is progressing. - Ammonia 128 on admission and trended down to 71 - His mental status is significantly improved, however still confused some -Continue to hold baclofen, gabapentin -UA negative and CXR without evidence of pneumonia -Appreciate radiology assistance for paracentesis: 3L removed, neg for evid of SBP. May need repeat paracentesis in 1-2 days -Hemoglobin stable -SLP: Cleared for regular diet -Resume home dose medications for now -Palliative care consultation following, plan to discharge home tomorrow with hospice  Goals of care - Long discussion with patient and patient's wife 2/22, they would like to  know whether he is a transplant candidate. I'm not sure I have the answer to that question, I reviewed patient's medical records at [facility] where he is followed for transplant hepatology, and everything is now on a holding pattern due to his infection and he will need to see them again after he finishes his antibiotics and he is cleared by infectious disease for his neck infection. I discussed with GI as well briefly 2/22 , if he is to get a drain for any ascites they'll completely get him off the transplant list.  - Again discussed with patient and his wife on 2/23, they still have some hopes, although slim for transplant, and they would like to defer drain placement as this will interfere with the transplant process   Esophageal varices ([place]) / ESOPHAGEAL STRICTURE - change to oral protonix  Thrombocytopenia ([place]) / Coagulapathy, trending down slightly - LIkely due to liver cirrhosis, however more severe than typical  Ascites s/p US guided paracentesis on 2/21 - Repeat paracentesis today 2/23   Acute renal failure superimposed on stage 3 chronic kidney disease ([place]), baseline creatinine of 1-1.2, but creatinine gradually increasing over last several months due to 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 Tommy Medal post-discharge  Severe protein  calorie malnutrition - Liberalize diet - Supplements   DVT prophylaxis: SCD Code Status: DNR Family  Communication: d/w wife bedside Disposition Plan: Home with hospice in 1 day  Barriers for discharge: mental status changes  Consultants:   Palliative  Procedures:   None   Antimicrobials:  Amoxicillin - long term   Subjective: - He endorses abdominal distention, however is feeling relatively well, appears clear for mental status standpoint  Objective: Filed Vitals:   08/30/15 1403 08/30/15 1413 08/30/15 1416 08/30/15 1425  BP: 98/55 111/61 101/57 105/57  Pulse:      Temp:      TempSrc:      Resp:      Height:      Weight:      SpO2:        Intake/Output Summary (Last 24 hours) at 08/30/15 1519 Last data filed at 08/30/15 1500  Gross per 24 hour  Intake    240 ml  Output      0 ml  Net    240 ml   Filed Weights   08/28/15 0347  Weight: 84.7 kg (186 lb 11.7 oz)    Examination: BP 105/57 mmHg  Pulse 82  Temp(Src) 98.7 F (37.1 C) (Oral)  Resp 18  Ht 5\' 10"  (1.778 m)  Wt 84.7 kg (186 lb 11.7 oz)  BMI 26.79 kg/m2  SpO2 100% General exam: NAD Respiratory system: Clear. No increased work of breathing. No wheezing, no crackles Cardiovascular system: regular rate and rhythm, no murmurs, gallops. No JVD. No peripheral edema.  Gastrointestinal system: Abdomen is distended, ascites present, dull to percution. Normal bowel sounds heard. Central nervous system: AxOx2-3. No focal deficits. Minimal asterixis Extremities: No clubbing/cyanosis Skin: no rashes   Data Reviewed: I have personally reviewed following labs and imaging studies  CBC:  Recent Labs Lab 08/27/15 1510 08/28/15 0321 08/29/15 0702 08/30/15 0557  WBC 3.2* 2.7* 2.9* 2.9*  NEUTROABS  --  1.8 2.0 1.8  HGB 9.9* 9.9* 9.6* 9.7*  HCT 29.3* 28.4* 27.9* 29.0*  MCV 87.5 88.2 89.1 87.9  PLT 22* 18* 24* 21*   Basic Metabolic Panel:  Recent Labs Lab 08/27/15 1510 08/28/15 0321 08/29/15 0702 08/30/15 0557  NA 132* 134* 132* 130*  K 4.5 4.2 4.8 5.1  CL 106 109 104 104  CO2 18* 16* 18* 20*    GLUCOSE 294* 156* 158* 261*  BUN 46* 46* 46* 48*  CREATININE 2.16* 2.12* 2.16* 2.11*  CALCIUM 9.8 9.5 9.4 9.2  MG  --  2.7* 2.4 2.4   GFR: Estimated Creatinine Clearance: 37.5 mL/min (by C-G formula based on Cr of 2.11). Liver Function Tests:  Recent Labs Lab 08/27/15 1510 08/28/15 0321 08/29/15 0702 08/30/15 0557  AST 51* 34 32 32  ALT 28 26 26 26   ALKPHOS 89 79 82 94  BILITOT 1.3* 1.5* 1.5* 1.1  PROT 6.3* 5.6* 5.5* 5.7*  ALBUMIN 3.6 3.3* 3.0* 3.0*   No results for input(s): LIPASE, AMYLASE in the last 168 hours.  Recent Labs Lab 08/27/15 1523 08/28/15 0321 08/29/15 0702 08/30/15 0557  AMMONIA 128* 71* 85* 51*   Coagulation Profile: No results for input(s): INR, PROTIME in the last 168 hours. Cardiac Enzymes: No results for input(s): CKTOTAL, CKMB, CKMBINDEX, TROPONINI in the last 168 hours. BNP (last 3 results) No results for input(s): PROBNP in the last 8760 hours. HbA1C: No results for input(s): HGBA1C in the last 72 hours. CBG:  Recent Labs Lab 08/29/15 1220 08/29/15 1721 08/29/15 2306  08/30/15 0817 08/30/15 1210  GLUCAP 243* 224* 295* 222* 244*   Lipid Profile: No results for input(s): CHOL, HDL, LDLCALC, TRIG, CHOLHDL, LDLDIRECT in the last 72 hours. Thyroid Function Tests: No results for input(s): TSH, T4TOTAL, FREET4, T3FREE, THYROIDAB in the last 72 hours. Anemia Panel: No results for input(s): VITAMINB12, FOLATE, FERRITIN, TIBC, IRON, RETICCTPCT in the last 72 hours. Urine analysis:    Component Value Date/Time   COLORURINE YELLOW 08/27/2015 1820   APPEARANCEUR CLEAR 08/27/2015 1820   LABSPEC 1.022 08/27/2015 1820   PHURINE 5.5 08/27/2015 1820   GLUCOSEU NEGATIVE 08/27/2015 1820   GLUCOSEU NEGATIVE 08/22/2015 1544   HGBUR NEGATIVE 08/27/2015 1820   BILIRUBINUR NEGATIVE 08/27/2015 1820   KETONESUR 15* 08/27/2015 1820   PROTEINUR NEGATIVE 08/27/2015 1820   UROBILINOGEN 0.2 08/22/2015 1544   NITRITE NEGATIVE 08/27/2015 1820    LEUKOCYTESUR NEGATIVE 08/27/2015 1820   Sepsis Labs: Invalid input(s): PROCALCITONIN, LACTICIDVEN  Recent Results (from the past 240 hour(s))  Urine Culture     Status: None   Collection Time: 08/22/15  3:38 PM  Result Value Ref Range Status   Colony Count NO GROWTH  Final   Organism ID, Bacteria NO GROWTH  Final  MRSA PCR Screening     Status: None   Collection Time: 08/28/15  3:52 AM  Result Value Ref Range Status   MRSA by PCR NEGATIVE NEGATIVE Final    Comment:        The GeneXpert MRSA Assay (FDA approved for NASAL specimens only), is one component of a comprehensive MRSA colonization surveillance program. It is not intended to diagnose MRSA infection nor to guide or monitor treatment for MRSA infections.   Culture, body fluid-bottle     Status: None (Preliminary result)   Collection Time: 08/28/15 10:05 AM  Result Value Ref Range Status   Specimen Description FLUID ASCITIC  Final   Special Requests NONE  Final   Culture NO GROWTH 2 DAYS  Final   Report Status PENDING  Incomplete  Gram stain     Status: None   Collection Time: 08/28/15 10:05 AM  Result Value Ref Range Status   Specimen Description FLUID ASCITIC  Final   Special Requests NONE  Final   Gram Stain   Final    RARE WBC PRESENT,BOTH PMN AND MONONUCLEAR NO ORGANISMS SEEN    Report Status 08/28/2015 FINAL  Final      Radiology Studies: No results found.   Scheduled Meds: . amoxicillin  500 mg Oral TID  . feeding supplement (PRO-STAT SUGAR FREE 64)  30 mL Oral TID  . fluconazole  100 mg Oral Daily  . FLUoxetine  10 mg Oral Daily  . gabapentin  100 mg Oral TID  . insulin aspart  0-9 Units Subcutaneous TID WC  . lactulose  30 g Oral Q6H  . lidocaine (PF)      . pantoprazole  40 mg Oral Daily  . rifaximin  550 mg Oral BID  . tamsulosin  0.4 mg Oral BID   Continuous Infusions:     Marzetta Board, MD, PhD [facility] Pager [phone]  If 7PM-7AM, please contact  night-coverage [email or web address] Password [facility] 08/30/2015, 3:19 PM

## 2015-08-30 NOTE — Evaluation (Signed)
Occupational Therapy Evaluation Patient Details Name: [name] MRN: [id number] DOB: 01-Jul-1951 Today's Date: 08/30/2015    History of Present Illness Pt adm with hepatic encephalopathy. PMH - NASH cirrhosis with history of recurrent hepatic encephalopathy, twice weekly 2-3L paracenteses, massive GI bleeds status post multiple endoscopies and ligations, epidural abcess, DM   Clinical Impression   Pt admitted with above diagnosis and presents to OT with functional limitations due to deficits listed below (See OT problem list). Pt should benefit from skilled OT to maximize independence and safety with basic ADL's to allow for discharge home with wife and hospice.     Follow Up Recommendations  No OT follow up;Supervision/Assistance - 24 hour;Other (comment) (Home with Hospice & 24/7 Assist)    Equipment Recommendations  None recommended by OT    Recommendations for Other Services       Precautions / Restrictions Precautions Precautions: Fall Restrictions Weight Bearing Restrictions: No      Mobility Bed Mobility Overal bed mobility: Needs Assistance Bed Mobility: Sidelying to Sit;Sit to Supine   Sidelying to sit: Min assist   Sit to supine: Min assist   General bed mobility comments: Increaed time & assist with trunk and scoot to EOB w/ pad.  Transfers Overall transfer level: Needs assistance Equipment used: Rolling walker (2 wheeled) Transfers: Sit to/from [facility] Sit to Stand: Min guard Stand pivot transfers: Min assist       General transfer comment: Verbal cues for hand placement and incr time    Balance Overall balance assessment: Needs assistance Sitting-balance support: Bilateral upper extremity supported;Feet supported Sitting balance-Leahy Scale: Fair     Standing balance support: Bilateral upper extremity supported Standing balance-Leahy Scale: Poor                              ADL Overall ADL's : Needs  assistance/impaired     Grooming: Wash/dry hands;Standing;Minimal assistance;Cueing for safety;Cueing for sequencing   Upper Body Bathing: Minimal assitance;Sitting   Lower Body Bathing: Minimal assistance;Sitting/lateral leans;Sit to/from stand   Upper Body Dressing : Minimal assistance;Sitting   Lower Body Dressing: Minimal assistance;Sit to/from stand;Sitting/lateral leans   Toilet Transfer: Minimal assistance;Cueing for safety;Cueing for sequencing;BSC;Grab bars;RW Toilet Transfer Details (indicate cue type and reason): 3:1 over toilet, increased time and assistance for sequencing, problem solving. Toileting- Clothing Manipulation and Hygiene: Moderate assistance;Sit to/from stand;Cueing for sequencing       Functional mobility during ADLs: Minimal assistance;Cueing for safety;Cueing for sequencing;Rolling walker General ADL Comments: Pt was assessed followed by verbal education [id number] of OT with pt/spouse. Pt participated in ADL retraining session for bed mobility, toileting in bathroom, and basic grooming standing at sink. He has overall difficulty with problem solving and safety/sequencing with ADL tasks. Requires increased time as well as vc/tc's. Plans to d/c home w/ spouse, hospice.     Vision  Wears glasses (PRN for reading or watching TV per pt). No change from baseline.   Perception     Praxis      Pertinent Vitals/Pain Pain Assessment: No/denies pain     Hand Dominance Right   Extremity/Trunk Assessment Upper Extremity Assessment Upper Extremity Assessment: Generalized weakness   Lower Extremity Assessment Lower Extremity Assessment: Generalized weakness;Defer to PT evaluation       Communication Communication Communication: No difficulties   Cognition Arousal/Alertness: Awake/alert Behavior During Therapy: WFL for tasks assessed/performed;Flat affect Overall Cognitive Status: Impaired/Different from baseline Area of Impairment: Problem solving  Problem Solving: Slow processing;Requires verbal cues;Requires tactile cues     General Comments       Exercises       Shoulder Instructions      Home Living Family/patient expects to be discharged to:: Private residence Living Arrangements: Spouse/significant other Available Help at Discharge: Family Type of Home: House Home Access: Stairs to enter [employer] of Steps: 2 Entrance Stairs-Rails: Right;Left;Can reach both Home Layout: Multi-level;Able to live on main level with bedroom/bathroom Alternate Level Stairs-Number of Steps: 1 flight Alternate Level Stairs-Rails: Right;Left Bathroom Shower/Tub: Tub only   Bathroom Toilet: Standard Bathroom Accessibility: Yes   Home Equipment: Walker - 2 wheels;Shower seat;Cane - single point;Bedside commode          Prior Functioning/Environment Level of Independence: Needs assistance  Gait / Transfers Assistance Needed: Has been amb with walker since hospital admission a few weeks ago. ADL's / Homemaking Assistance Needed: wife assists with dressing PRN. pt usually able to bathe himself.    Comments: using walker in community. furniture walk inside home.     OT Diagnosis: Generalized weakness;Cognitive deficits   OT Problem List: Decreased strength;Decreased cognition;Decreased knowledge of use of DME or AE;Impaired UE functional use   OT Treatment/Interventions: Self-care/ADL training;DME and/or AE instruction;Patient/family education;Cognitive remediation/compensation;Therapeutic activities    OT Goals(Current goals can be found in the care plan section) Acute Rehab OT Goals Patient Stated Goal: return home Time For Goal Achievement: 09/13/15 Potential to Achieve Goals: Good ADL Goals Pt Will Perform Lower Body Dressing: with supervision;with adaptive equipment;sitting/lateral leans;sit to/from stand;with caregiver independent in assisting Pt Will Transfer to Toilet: with  supervision;ambulating;bedside commode Pt Will Perform Toileting - Clothing Manipulation and hygiene: with supervision;sitting/lateral leans;sit to/from stand;with caregiver independent in assisting Pt Will Perform Tub/Shower Transfer: Tub transfer;with min assist;with caregiver independent in assisting;ambulating;tub bench;grab bars;rolling walker  OT Frequency: Min 2X/week   Barriers to D/C:            Co-evaluation              End of Session Equipment Utilized During Treatment: Gait belt;Rolling walker Nurse Communication: Mobility status;Other (comment) (Pt urinated on bathroom floor, RN made aware that floor needs to be cleaned)  Activity Tolerance: Patient tolerated treatment well Patient left: in bed;with call bell/phone within reach;with family/visitor present   Time: [id number] OT Time Calculation (min): 39 min Charges:  OT General Charges $OT Visit: 1 Procedure OT Evaluation $OT Eval Moderate Complexity: 1 Procedure OT Treatments $Self Care/Home Management : 8-22 mins G-Codes:    Almyra Deforest, OTR/L 08/30/2015, 8:57 AM

## 2015-08-31 LAB — COMPREHENSIVE METABOLIC PANEL
ALBUMIN: 3.1 g/dL — AB (ref 3.5–5.0)
ALT: 25 U/L (ref 17–63)
AST: 33 U/L (ref 15–41)
Alkaline Phosphatase: 84 U/L (ref 38–126)
Anion gap: 8 (ref 5–15)
BUN: 53 mg/dL — AB (ref 6–20)
CHLORIDE: 105 mmol/L (ref 101–111)
CO2: 16 mmol/L — AB (ref 22–32)
CREATININE: 2.17 mg/dL — AB (ref 0.61–1.24)
Calcium: 9.3 mg/dL (ref 8.9–10.3)
GFR calc Af Amer: 36 mL/min — ABNORMAL LOW (ref >60)
GFR calc non Af Amer: 31 mL/min — ABNORMAL LOW (ref >60)
GLUCOSE: 237 mg/dL — AB (ref 65–99)
POTASSIUM: 5.7 mmol/L — AB (ref 3.5–5.1)
SODIUM: 129 mmol/L — AB (ref 135–145)
Total Bilirubin: 1.5 mg/dL — ABNORMAL HIGH (ref 0.3–1.2)
Total Protein: 5.7 g/dL — ABNORMAL LOW (ref 6.5–8.1)

## 2015-08-31 LAB — CBC WITH DIFFERENTIAL/PLATELET
BASOS ABS: 0 10*3/uL (ref 0.0–0.1)
Basophils Relative: 0 %
EOS PCT: 4 %
Eosinophils Absolute: 0.1 10*3/uL (ref 0.0–0.7)
HEMATOCRIT: 28.5 % — AB (ref 39.0–52.0)
Hemoglobin: 9.5 g/dL — ABNORMAL LOW (ref 13.0–17.0)
LYMPHS ABS: 0.4 10*3/uL — AB (ref 0.7–4.0)
LYMPHS PCT: 17 %
MCH: 29.1 pg (ref 26.0–34.0)
MCHC: 33.3 g/dL (ref 30.0–36.0)
MCV: 87.2 fL (ref 78.0–100.0)
MONO ABS: 0.4 10*3/uL (ref 0.1–1.0)
Monocytes Relative: 17 %
NEUTROS ABS: 1.5 10*3/uL — AB (ref 1.7–7.7)
Neutrophils Relative %: 61 %
PLATELETS: 19 10*3/uL — AB (ref 150–400)
RBC: 3.27 MIL/uL — ABNORMAL LOW (ref 4.22–5.81)
RDW: 15.3 % (ref 11.5–15.5)
WBC: 2.5 10*3/uL — ABNORMAL LOW (ref 4.0–10.5)

## 2015-08-31 LAB — GLUCOSE, CAPILLARY
GLUCOSE-CAPILLARY: 245 mg/dL — AB (ref 65–99)
Glucose-Capillary: 201 mg/dL — ABNORMAL HIGH (ref 65–99)

## 2015-08-31 LAB — AMMONIA: Ammonia: 65 umol/L — ABNORMAL HIGH (ref 9–35)

## 2015-08-31 LAB — MAGNESIUM: MAGNESIUM: 2.4 mg/dL (ref 1.7–2.4)

## 2015-08-31 NOTE — Care Management Note (Addendum)
Case Management Note  Patient Details  Name: [name] MRN: [id number] Date of Birth: 1950-10-19  Subjective/Objective:                    Action/Plan: Plan is for pt to be d/c today with home health services, RN,PT. Provider is [facility]. No further needs identified per CM.  Expected Discharge Date:    08/31/15           Expected Discharge Plan:    In-House Referral:     Discharge planning Services  CM Consult  Post Acute Care Choice:    Choice offered to:  Spouse/PATIENT  DME Arranged:    DME Agency:     HH Arranged: Resumption  for HHRN/PT arranged. [place] Agency: [facility]  Status of Service:  In process, will continue to follow  Medicare Important Message Given:    Date Medicare IM Given:    Medicare IM give by:    Date Additional Medicare IM Given:    Additional Medicare Important Message give by:     If discussed at [place] of Stay Meetings, dates discussed:    Additional Comments:  [name], [place] [phone] 08/31/2015, 11:20 AM

## 2015-08-31 NOTE — Discharge Summary (Signed)
Physician Discharge Summary  [name] [id number] DOB: May 27, 1951 DOA: 08/27/2015  PCP: [name], MD  Admit date: 08/27/2015 Discharge date: 08/31/2015  Time spent: > 30 minutes  Recommendations for Outpatient Follow-up:  1. Follow up with GI as scheduled in 3 days 2. Home health 3. Hospice if patient significantly deteriorates  Discharge Diagnoses:  Principal Problem:   Hepatic encephalopathy ([place]) Active Problems:   Thrombocytopenia ([name])   Acute hepatic encephalopathy (HCC)   Ascites   Acute renal failure superimposed on stage 3 chronic kidney disease (HCC)   Anemia of chronic disease   Protein-calorie malnutrition, severe   Hepatic cirrhosis ([place])   Type 2 diabetes mellitus ([name])   Palliative care encounter  Discharge Condition: guarded  Diet recommendation: regular  Filed Weights   08/28/15 0347  Weight: 84.7 kg (186 lb 11.7 oz)    History of present illness:  See H&P, Labs, Consult and Test reports for all details in brief, patient is a 65 y.o. 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   Hospital Course:  Acute hepatic encephalopathy (HCC) due to NASH, which has recurred despite medication compliance and 2 large BMs daily.No evidence of infection or hemorrhage as a trigger, however, I worry that his disease is progressing.His ammonia level was 128 on admission, and this improved with lactulose enemas and then once his mental status was good enough he was placed on by mouth lactulose. His mental status returned to  baseline, this was confirmed by his wife bedside. Goals of care - palliative care was consulted and have followed patient while hospitalized. His prognosis is quite poor given significant decompensation with recurrent ascites requiring paracentesis twice a week, as well as progressive hepatic encephalopathy at home. We discussed extensively about his quality of life and the fact that he needs to come to the hospital twice weekly for paracentesis as well as placement of a palliative drain to help with ascitic fluid at home. He is currently not a transplant candidate given her active infection in his neck for which he is on antibiotics, however he has a follow-up appointment at [facility] transplant clinic in March. He is hoping to finish antibiotic course, which are to be done this March, cleared by infectious disease and that he noticed that eventually would make it to the transplant list. Placing a drain with definitely hamper him being on transplant list, and patient and his wife decided not to go with drain placement at this time. They did agree with home hospice however he was declined for hospice services given the fact that he did not have a drain in place. Home health was arranged as an alternative. I think his prognosis is quite poor, he has hepatorenal syndrome as well limiting how much ascitic fluid is removed each time, and I would strongly recommend hospice services be initiated if he is to continue to get worse. 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 Tommy Medal post-discharge Severe protein calorie malnutrition - Liberalize diet - Supplements   Procedures:  Paracentesis x 2   Consultations:  IR  Palliative  Discharge Exam: Filed Vitals:   08/30/15 1416 08/30/15 1425 08/30/15 2228 08/31/15 0541  BP: 101/57 105/57 103/61 111/57  Pulse:   75 73  Temp:   98.3 F (36.8 C) 98.2 F (36.8 C)  TempSrc:   Oral Oral  Resp:   18 18  Height:      Weight:      SpO2:   100% 100%    General: NAD Cardiovascular: RRR Respiratory: CTA biL  Discharge Instructions Activity:  As tolerated   Get Medicines reviewed and adjusted: Please take all your medications with you for your next visit with your Primary MD  Please request your Primary MD to go over all hospital tests and procedure/radiological results at the follow up, please ask your Primary MD to get all Hospital records sent to his/her office.  If you experience worsening of your admission symptoms, develop shortness of breath, life threatening emergency, suicidal or homicidal thoughts you must seek medical attention immediately by calling 911 or calling your MD immediately if symptoms less severe.  You must read complete instructions/literature along with all the possible adverse reactions/side effects for all the Medicines you take and that have been prescribed to you. Take any new Medicines after you have completely understood and accpet all the possible adverse reactions/side effects.   Do not drive when taking Pain medications.   Do not take more than prescribed Pain, Sleep and Anxiety Medications  Special Instructions: If you have smoked or chewed Tobacco in the last 2 yrs please stop smoking, stop any regular Alcohol and or any Recreational drug use.  Wear Seat belts  while driving.  Please note  You were cared for by a hospitalist during your hospital stay. Once you are discharged, your primary care physician will handle any further medical issues. Please note that NO REFILLS for any discharge medications will be authorized once you are discharged, as it is imperative that you return to your primary care physician (or establish a relationship with a primary care physician if you do not have one) for your aftercare needs so that they can reassess your need for medications and monitor your lab values.    Medication List    TAKE these medications        amoxicillin 500 MG capsule  Commonly known as:  AMOXIL  Take 1 capsule (500 mg total) by mouth 3 (three) times daily.     baclofen 20 MG tablet  Commonly known as:  LIORESAL  Take 1 tablet (20 mg total) by mouth at bedtime.     bethanechol 50 MG tablet  Commonly known as:  URECHOLINE  Take 1 tablet by mouth 2 (two) times daily.     feeding supplement (PRO-STAT SUGAR FREE 64) Liqd  Take 30 mLs by mouth 3 (three) times daily.     fluconazole 100 MG tablet  Commonly known as:  DIFLUCAN  Take 1 tablet by mouth daily.     FLUoxetine 10 MG tablet  Commonly known as:  PROZAC  Take 10 mg by mouth daily.     fluticasone 50 MCG/ACT nasal spray  Commonly known as:  FLONASE  Place 1 spray into both nostrils daily.     gabapentin 100 MG capsule  Commonly known as:  NEURONTIN  Take 100 mg by mouth 3 (three) times daily.     Insulin Degludec 200 UNIT/ML Sopn  Commonly known as:  TRESIBA FLEXTOUCH  Inject 20 Units into the skin daily.     lactulose 10 GM/15ML solution  Commonly known as:  CHRONULAC  Take 45 mLs (30 g total) by mouth 3 (three) times daily.     multivitamin with minerals Tabs tablet  Take 1 tablet by mouth daily.     pantoprazole 40 MG tablet  Commonly known as:  PROTONIX  TAKE 1 TABLET (40 MG TOTAL) BY MOUTH DAILY.     tamsulosin 0.4 MG Caps capsule  Commonly known as:   FLOMAX  Take 1 capsule by mouth 2 (two) times daily.     Vitamin D (Ergocalciferol) 50000 units Caps capsule  Commonly known as:  DRISDOL  Take 50,000 Units by mouth every 7 (seven) days. Mondays.     XIFAXAN 550 MG Tabs tablet  Generic drug:  rifaximin  TAKE 1 TABLET (550 MG TOTAL) BY MOUTH 2 (TWO) TIMES DAILY.           Follow-up Information    Follow up with [name], MD. Schedule an appointment as soon as possible for a visit in 1 week.   Specialty:  Family Medicine   Contact information:   [place] [address] [phone]       Follow up with [facility].   Specialty:  Home Health Services   Why:  Home health RN, PT  resumed   Contact information:   [address] [place] [address] [phone]       The results of significant diagnostics from this hospitalization (including imaging, microbiology, ancillary and laboratory) are listed below for reference.    Significant Diagnostic Studies: Dg Chest 2 View  08/27/2015  CLINICAL DATA:  Cirrhosis, altered mental status EXAM: CHEST  2 VIEW COMPARISON:  08/22/2015 FINDINGS: Cardiomediastinal silhouette is stable. There is elevation of the right hemidiaphragm. No infiltrate or pulmonary edema. Mild basilar atelectasis. IMPRESSION: Cardiomegaly. Elevation of the right hemidiaphragm. Mild basilar atelectasis. No pulmonary edema. Electronically Signed   By: Lahoma Crocker M.D.   On: 08/27/2015 16:19   Dg Chest 2 View  08/22/2015  CLINICAL DATA:  Cough/chills/left anterior cp x  37mo. Hx of DM. EXAM: CHEST  2 VIEW COMPARISON:  Multiple priors, including 08/04/2015 FINDINGS: Lateral view degraded by patient arm position. Midline trachea. Mild cardiomegaly. No pleural effusion or pneumothorax. Reverse apical lordotic positioning. Prominent first anterior ribs, worse on the right. Similar. Clear lungs. IMPRESSION: No acute cardiopulmonary disease. Mild cardiomegaly, without congestive  failure. Electronically Signed   By: Abigail Miyamoto M.D.   On: 08/22/2015 16:06   Dg Chest 2 View  08/04/2015  CLINICAL DATA:  Progressive abdominal and chest pain since this morning. Shortness of breath. Ascites and abdominal distention. EXAM: CHEST  2 VIEW COMPARISON:  07/10/2015 FINDINGS: Very low lung volumes are again seen with bibasilar atelectasis. No evidence of pulmonary consolidation or pleural effusion. No pneumothorax visualized. Heart size is within normal limits. IMPRESSION: Very low lung volumes with bibasilar atelectasis. Electronically Signed   By: Earle Gell M.D.   On: 08/04/2015 21:00  US Abdomen Limited  08/27/2015  CLINICAL DATA:  Evaluate ascites for possible paracentesis EXAM: LIMITED ABDOMEN ULTRASOUND FOR ASCITES TECHNIQUE: Limited ultrasound survey for ascites was performed in all four abdominal quadrants. COMPARISON:  Abdominal ultrasound of August 23, 2015 FINDINGS: The patient was noted be quite lethargic and confused and precluded paracentesis. A moderate volume of fluid is seen in all quadrants of the abdomen. IMPRESSION: There is a moderate volume of ascites present. Ascites could not be performed due to the patient's clinical condition as perched [name], PAC. Electronically Signed   By: David  Martinique M.D.   On: 08/27/2015 15:35   US Paracentesis  08/30/2015  INDICATION: Abdominal distention secondary recurrent ascites. Request therapeutic paracentesis of up to 3 L max. EXAM: ULTRASOUND GUIDED RIGHT LOWER QUADRANT PARACENTESIS MEDICATIONS: None. COMPLICATIONS: None immediate. PROCEDURE: Informed written consent was obtained from the patient after a discussion of the risks, benefits and alternatives to treatment. A timeout was performed prior to the initiation of the procedure. Initial ultrasound scanning demonstrates a large amount of ascites within the right lower abdominal quadrant. The right lower abdomen was prepped and draped in the usual sterile fashion. 1%  lidocaine with epinephrine was used for local anesthesia. Following this, a Safe-T-Centesis catheter was introduced. An ultrasound image was saved for documentation purposes. The paracentesis was performed. The catheter was removed and a dressing was applied. The patient tolerated the procedure well without immediate post procedural complication. FINDINGS: A total of approximately 3 L of clear yellow fluid was removed. IMPRESSION: Successful ultrasound-guided paracentesis yielding 3 liters of peritoneal fluid. Read by: Ascencion Dike PA-C Electronically Signed   By: Lucrezia Europe M.D.   On: 08/30/2015 14:59   US Paracentesis  08/28/2015  INDICATION: 65 year old male with a history of ascites. Multiple prior paracentesis. Request for no more than 3.0 L aspiration. EXAM: ULTRASOUND GUIDED  PARACENTESIS MEDICATIONS: None. COMPLICATIONS: None PROCEDURE: Informed written consent was obtained from the patient after a discussion of the risks, benefits and alternatives to treatment. A timeout was performed prior to the initiation of the procedure. Initial ultrasound scanning demonstrates a large amount of ascites within the right lower abdominal quadrant. The right lower abdomen was prepped and draped in the usual sterile fashion. 1% lidocaine with epinephrine was used for local anesthesia. Following this, a yueh catheter was introduced. An ultrasound image was saved for documentation purposes. The paracentesis was performed. The catheter was removed and a dressing was applied. The patient tolerated the procedure well without immediate post procedural complication. FINDINGS: A total of approximately 3.0 L of thin yellow fluid was removed. Samples were sent to the laboratory as requested by the clinical team. IMPRESSION: Status post ultrasound-guided paracentesis with 3.0 L fluid removed. Sample sent to the lab for analysis. Signed, Dulcy Fanny. Earleen Newport, DO [facility]  Electronically Signed   By: Corrie Mckusick D.O.   On: 08/28/2015 10:25   US Paracentesis  08/23/2015  INDICATION: NASH, chronic renal insufficiency. Progressive hepatic failure with encephalopathy. Request for diagnostic and therapeutic paracentesis of up to 3 L max. EXAM: ULTRASOUND GUIDED LEFT LOWER QUADRANT PARACENTESIS MEDICATIONS: None. COMPLICATIONS: None immediate. PROCEDURE: Informed written consent was obtained from the patient after a discussion of the risks, benefits and alternatives to treatment. A timeout was performed prior to the initiation of the procedure. Initial ultrasound scanning demonstrates a large amount of ascites within the right lower abdominal quadrant. The right lower abdomen was prepped and draped in the usual sterile  fashion. 1% lidocaine with epinephrine was used for local anesthesia. Following this, a 19 gauge, 7 cm Yueh catheter was introduced. An ultrasound image was saved for documentation purposes. The paracentesis was performed. The catheter was removed and a dressing was applied. The patient tolerated the procedure well without immediate post procedural complication. FINDINGS: A total of approximately 3 L of hazy yellow fluid was removed. Samples were sent to the laboratory as requested by the clinical team. IMPRESSION: Successful ultrasound-guided paracentesis yielding 3 liters of peritoneal fluid. Read by: Ascencion Dike PA-C Electronically Signed   By: Markus Daft M.D.   On: 08/23/2015 14:51   US Paracentesis  08/20/2015  INDICATION: Cirrhosis, renal failure, recurrent ascites. Request is made for diagnostic and therapeutic paracentesis up to 3 L max. EXAM: ULTRASOUND GUIDED THERAPEUTIC AND DIAGNOSTIC PARACENTESIS MEDICATIONS: 1% lidocaine COMPLICATIONS: None immediate. PROCEDURE: Informed written consent was obtained from the patient after a discussion of the risks, benefits and alternatives to treatment. A timeout was performed prior to the initiation of the procedure.  Initial ultrasound scanning demonstrates a large amount of ascites within the left lower abdominal quadrant. The left lower abdomen was prepped and draped in the usual sterile fashion. 1% lidocaine was used for local anesthesia. Following this, a Safe-T-Centesis catheter was introduced. An ultrasound image was saved for documentation purposes. The paracentesis was performed. The catheter was removed and a dressing was applied. The patient tolerated the procedure well without immediate post procedural complication. FINDINGS: A total of approximately 3 L of clear yellow fluid was removed. Samples were sent to the laboratory as requested by the clinical team. IMPRESSION: Successful ultrasound-guided paracentesis yielding 3 liters of peritoneal fluid. Read by: [name], PA-C Electronically Signed   By: Markus Daft M.D.   On: 08/20/2015 15:04   US Paracentesis  08/17/2015  INDICATION: Cirrhosis, renal failure, recurrent ascites. Request is made for therapeutic paracentesis up to 3 liters. EXAM: ULTRASOUND GUIDED THERAPEUTIC PARACENTESIS MEDICATIONS: None. COMPLICATIONS: None immediate. PROCEDURE: Informed written consent was obtained from the patient after a discussion of the risks, benefits and alternatives to treatment. A timeout was performed prior to the initiation of the procedure. Initial ultrasound scanning demonstrates a large amount of ascites within the left lower abdominal quadrant. The left lower abdomen was prepped and draped in the usual sterile fashion. 1% lidocaine was used for local anesthesia. Following this, a Yueh catheter was introduced. An ultrasound image was saved for documentation purposes. The paracentesis was performed. The catheter was removed and a dressing was applied. The patient tolerated the procedure well without immediate post procedural complication. FINDINGS: A total of approximately 3 liters of yellow fluid was removed. IMPRESSION: Successful ultrasound-guided therapeutic  paracentesis yielding 3 liters of peritoneal fluid. The patient will receive IV albumin infusion postprocedure. Read by: Rowe Robert, PA-C Electronically Signed   By: Sandi Mariscal M.D.   On: 08/17/2015 10:34   US Paracentesis  08/13/2015  INDICATION: Cirrhosis, recurrent ascites. Request is made for a diagnostic and therapeutic paracentesis with a max of 3 L. EXAM: ULTRASOUND GUIDED right lower quadrant PARACENTESIS MEDICATIONS: 1% lidocaine COMPLICATIONS: None immediate. PROCEDURE: Informed written consent was obtained from the patient after a discussion of the risks, benefits and alternatives to treatment. A timeout was performed prior to the initiation of the procedure. Initial ultrasound scanning demonstrates a large amount of ascites within the right lower abdominal quadrant. The right lower abdomen was prepped and draped in the usual sterile fashion. 1% lidocaine was used for local anesthesia.  Following this, a Safe-T-Centesis catheter was introduced. An ultrasound image was saved for documentation purposes. The paracentesis was performed. The catheter was removed and a dressing was applied. The patient tolerated the procedure well without immediate post procedural complication. FINDINGS: A total of approximately 3 L of clear yellow fluid was removed. Samples were sent to the laboratory as requested by the clinical team. IMPRESSION: Successful ultrasound-guided paracentesis yielding 3 liters of peritoneal fluid. Post procedure the patient received 25 g of albumin per [name] Media request. Read by: [name], PA-C Electronically Signed   By: Corrie Mckusick D.O.   On: 08/13/2015 11:33   US Paracentesis  08/10/2015  INDICATION: Cirrhosis, renal failure, recurrent ascites. Request is made for diagnostic and therapeutic paracentesis up to 3 liters. EXAM: ULTRASOUND GUIDED DIAGNOSTIC AND THERAPEUTIC PARACENTESIS MEDICATIONS: None. COMPLICATIONS: None immediate. PROCEDURE: Informed written consent was obtained  from the patient after a discussion of the risks, benefits and alternatives to treatment. A timeout was performed prior to the initiation of the procedure. Initial ultrasound scanning demonstrates a large amount of ascites within the left lower abdominal quadrant. The left lower abdomen was prepped and draped in the usual sterile fashion. 1% lidocaine was used for local anesthesia. Following this, a Yueh catheter was introduced. An ultrasound image was saved for documentation purposes. The paracentesis was performed. The catheter was removed and a dressing was applied. The patient tolerated the procedure well without immediate post procedural complication. FINDINGS: A total of approximately 3 liters of yellow fluid was removed. Samples were sent to the laboratory as requested by the clinical team. IMPRESSION: Successful ultrasound-guided diagnostic and therapeutic paracentesis yielding 3 liters of peritoneal fluid. The patient will receive IV albumin infusion postprocedure. Read by: Rowe Robert, PA-C Electronically Signed   By: Lucrezia Europe M.D.   On: 08/10/2015 10:58   US Paracentesis  08/07/2015  INDICATION: Cirrhosis, renal failure, recurrent ascites. Request is made for diagnostic and therapeutic paracentesis up to 2 liters. EXAM: ULTRASOUND GUIDED DIAGNOSTIC AND THERAPEUTIC PARACENTESIS MEDICATIONS: None. COMPLICATIONS: None immediate. PROCEDURE: Informed written consent was obtained from the patient after a discussion of the risks, benefits and alternatives to treatment. A timeout was performed prior to the initiation of the procedure. Initial ultrasound scanning demonstrates a large amount of ascites within the left lower abdominal quadrant. The left lower abdomen was prepped and draped in the usual sterile fashion. 1% lidocaine was used for local anesthesia. Following this, a Yueh catheter was introduced. An ultrasound image was saved for documentation purposes. The paracentesis was performed. The catheter  was removed and a dressing was applied. The patient tolerated the procedure well without immediate post procedural complication. FINDINGS: A total of approximately 2 liters of yellow fluid was removed. Samples were sent to the laboratory as requested by the clinical team. IMPRESSION: Successful ultrasound-guided diagnostic and therapeutic paracentesis yielding 2 liters of peritoneal fluid. The patient will receive IV albumin infusion postprocedure. Read by: Rowe Robert, PA-C Electronically Signed   By: Lucrezia Europe M.D.   On: 08/07/2015 12:19   US Paracentesis  08/05/2015  INDICATION: Cirrhosis. Renal failure. Recurrent ascites. Request for therapeutic paracentesis of up to 2 L max. EXAM: ULTRASOUND GUIDED RIGHT LOWER QUADRANT PARACENTESIS MEDICATIONS: None. COMPLICATIONS: None immediate. PROCEDURE: Informed written consent was obtained from the patient after a discussion of the risks, benefits and alternatives to treatment. A timeout was performed prior to the initiation of the procedure. Initial ultrasound scanning demonstrates a large amount of ascites within the right lower abdominal  quadrant. The right lower abdomen was prepped and draped in the usual sterile fashion. 1% lidocaine with epinephrine was used for local anesthesia. Following this, a 19 gauge, 7 cm Yueh catheter was introduced. An ultrasound image was saved for documentation purposes. The paracentesis was performed. The catheter was removed and a dressing was applied. The patient tolerated the procedure well without immediate post procedural complication. FINDINGS: A total of approximately 2 L of clear yellow fluid was removed. Samples were sent to the laboratory as requested by the clinical team. IMPRESSION: Successful ultrasound-guided paracentesis yielding to liters of peritoneal fluid. Read by: Ascencion Dike PA-C Electronically Signed   By: Jerilynn Mages.  Shick M.D.   On: 08/05/2015 12:22    Microbiology: Recent Results (from the past 240 hour(s))    Urine Culture     Status: None   Collection Time: 08/22/15  3:38 PM  Result Value Ref Range Status   Colony Count NO GROWTH  Final   Organism ID, Bacteria NO GROWTH  Final  MRSA PCR Screening     Status: None   Collection Time: 08/28/15  3:52 AM  Result Value Ref Range Status   MRSA by PCR NEGATIVE NEGATIVE Final    Comment:        The GeneXpert MRSA Assay (FDA approved for NASAL specimens only), is one component of a comprehensive MRSA colonization surveillance program. It is not intended to diagnose MRSA infection nor to guide or monitor treatment for MRSA infections.   Culture, body fluid-bottle     Status: None (Preliminary result)   Collection Time: 08/28/15 10:05 AM  Result Value Ref Range Status   Specimen Description FLUID ASCITIC  Final   Special Requests NONE  Final   Culture NO GROWTH 2 DAYS  Final   Report Status PENDING  Incomplete  Gram stain     Status: None   Collection Time: 08/28/15 10:05 AM  Result Value Ref Range Status   Specimen Description FLUID ASCITIC  Final   Special Requests NONE  Final   Gram Stain   Final    RARE WBC PRESENT,BOTH PMN AND MONONUCLEAR NO ORGANISMS SEEN    Report Status 08/28/2015 FINAL  Final     Labs: Basic Metabolic Panel:  Recent Labs Lab 08/27/15 1510 08/28/15 0321 08/29/15 0702 08/30/15 0557 08/31/15 0534  NA 132* 134* 132* 130* 129*  K 4.5 4.2 4.8 5.1 5.7*  CL 106 109 104 104 105  CO2 18* 16* 18* 20* 16*  GLUCOSE 294* 156* 158* 261* 237*  BUN 46* 46* 46* 48* 53*  CREATININE 2.16* 2.12* 2.16* 2.11* 2.17*  CALCIUM 9.8 9.5 9.4 9.2 9.3  MG  --  2.7* 2.4 2.4 2.4   Liver Function Tests:  Recent Labs Lab 08/27/15 1510 08/28/15 0321 08/29/15 0702 08/30/15 0557 08/31/15 0534  AST 51* 34 32 32 33  ALT 28 26 26 26 25   ALKPHOS 89 79 82 94 84  BILITOT 1.3* 1.5* 1.5* 1.1 1.5*  PROT 6.3* 5.6* 5.5* 5.7* 5.7*  ALBUMIN 3.6 3.3* 3.0* 3.0* 3.1*   No results for input(s): LIPASE, AMYLASE in the last 168  hours.  Recent Labs Lab 08/27/15 1523 08/28/15 0321 08/29/15 0702 08/30/15 0557 08/31/15 0550  AMMONIA 128* 71* 85* 51* 65*   CBC:  Recent Labs Lab 08/27/15 1510 08/28/15 0321 08/29/15 0702 08/30/15 0557 08/31/15 0534  WBC 3.2* 2.7* 2.9* 2.9* 2.5*  NEUTROABS  --  1.8 2.0 1.8 1.5*  HGB 9.9* 9.9* 9.6* 9.7* 9.5*  HCT  29.3* 28.4* 27.9* 29.0* 28.5*  MCV 87.5 88.2 89.1 87.9 87.2  PLT 22* 18* 24* 21* 19*   BNP: BNP (last 3 results)  Recent Labs  07/25/15 1144  BNP 75.7   CBG:  Recent Labs Lab 08/30/15 1210 08/30/15 1713 08/30/15 2226 08/31/15 0834 08/31/15 1156  GLUCAP 244* 209* 261* 201* 245*    Signed:  GHERGHE, COSTIN  [facility] 08/31/2015, 4:24 PM

## 2015-08-31 NOTE — Discharge Instructions (Signed)
Follow with Jonathan Cousin, MD in 5-7 days  Please get a complete blood count and chemistry panel checked by your Primary MD at your next visit, and again as instructed by your Primary MD. Please get your medications reviewed and adjusted by your Primary MD.  Please request your Primary MD to go over all Hospital Tests and Procedure/Radiological results at the follow up, please get all Hospital records sent to your Prim MD by signing hospital release before you go home.  If you had Pneumonia of Lung problems at the Hospital: Please get a 2 view Chest X ray done in 6-8 weeks after hospital discharge or sooner if instructed by your Primary MD.  If you have Congestive Heart Failure: Please call your Cardiologist or Primary MD anytime you have any of the following symptoms:  1) 3 pound weight gain in 24 hours or 5 pounds in 1 week  2) shortness of breath, with or without a dry hacking cough  3) swelling in the hands, feet or stomach  4) if you have to sleep on extra pillows at night in order to breathe  Follow cardiac low salt diet and 1.5 lit/day fluid restriction.  If you have diabetes Accuchecks 4 times/day, Once in AM empty stomach and then before each meal. Log in all results and show them to your primary doctor at your next visit. If any glucose reading is under 80 or above 300 call your primary MD immediately.  If you have Seizure/Convulsions/Epilepsy: Please do not drive, operate heavy machinery, participate in activities at heights or participate in high speed sports until you have seen by Primary MD or a Neurologist and advised to do so again.  If you had Gastrointestinal Bleeding: Please ask your Primary MD to check a complete blood count within one week of discharge or at your next visit. Your endoscopic/colonoscopic biopsies that are pending at the time of discharge, will also need to followed by your Primary MD.  Get Medicines reviewed and adjusted. Please take all your  medications with you for your next visit with your Primary MD  Please request your Primary MD to go over all hospital tests and procedure/radiological results at the follow up, please ask your Primary MD to get all Hospital records sent to his/her office.  If you experience worsening of your admission symptoms, develop shortness of breath, life threatening emergency, suicidal or homicidal thoughts you must seek medical attention immediately by calling 911 or calling your MD immediately  if symptoms less severe.  You must read complete instructions/literature along with all the possible adverse reactions/side effects for all the Medicines you take and that have been prescribed to you. Take any new Medicines after you have completely understood and accpet all the possible adverse reactions/side effects.   Do not drive or operate heavy machinery when taking Pain medications.   Do not take more than prescribed Pain, Sleep and Anxiety Medications  Special Instructions: If you have smoked or chewed Tobacco  in the last 2 yrs please stop smoking, stop any regular Alcohol  and or any Recreational drug use.  Wear Seat belts while driving.  Please note You were cared for by a hospitalist during your hospital stay. If you have any questions about your discharge medications or the care you received while you were in the hospital after you are discharged, you can call the unit and asked to speak with the hospitalist on call if the hospitalist that took care of you is not available. Once  you are discharged, your primary care physician will handle any further medical issues. Please note that NO REFILLS for any discharge medications will be authorized once you are discharged, as it is imperative that you return to your primary care physician (or establish a relationship with a primary care physician if you do not have one) for your aftercare needs so that they can reassess your need for medications and monitor your  lab values.  You can reach the hospitalist office at phone [phone] or fax [phone]   If you do not have a primary care physician, you can call [phone] for a physician referral.  Activity: As tolerated with Full fall precautions use walker/cane & assistance as needed  Diet: regular  Disposition Home

## 2015-08-31 NOTE — Progress Notes (Signed)
Physical Therapy Treatment Patient Details Name: [name] MRN: [id number] DOB: 16-Oct-1950 Today's Date: 08/31/2015    History of Present Illness Pt adm with hepatic encephalopathy. 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  Pt required cues for RW position and wife educated on assistance at home.  Pt to d/c today.    Follow Up Recommendations  No PT follow up (hospice)     Equipment Recommendations  None recommended by PT    Recommendations for Other Services       Precautions / Restrictions Precautions Precautions: Fall Restrictions Weight Bearing Restrictions: No    Mobility  Bed Mobility Overal bed mobility: Modified Independent     Sidelying to sit: Supervision       General bed mobility comments: Required increased time but demonstrated good safety.  Required cues to scoot edge of bed in prep for transfer training.    Transfers Overall transfer level: Needs assistance Equipment used: Rolling walker (2 wheeled) Transfers: Sit to/from Stand Sit to Stand: Supervision;Min guard (S for sit to stand and min guard for stand to sit for hand placement to improve safety.  )            Ambulation/Gait Ambulation/Gait assistance: Min guard Ambulation Distance (Feet): 120 Feet Assistive device: Rolling walker (2 wheeled) Gait Pattern/deviations: Step-through pattern;Decreased step length - left;Trunk flexed Gait velocity: decr   General Gait Details: Slow gait but no loss of balance, unsteady, educated wife on assistance at home to maintain safety.     Stairs            Wheelchair Mobility    Modified Rankin (Stroke Patients Only)       Balance Overall balance assessment: Needs assistance   Sitting balance-Leahy Scale:  Fair       Standing balance-Leahy Scale: Poor                      Cognition Arousal/Alertness: Awake/alert Behavior During Therapy: WFL for tasks assessed/performed;Flat affect Overall Cognitive Status: Impaired/Different from baseline Area of Impairment: Problem solving             Problem Solving: Slow processing;Requires verbal cues;Requires tactile cues      Exercises      General Comments        Pertinent Vitals/Pain Pain Assessment: No/denies pain    Home Living                      Prior Function            PT Goals (current goals can now be found in the care plan section) Acute Rehab PT Goals Patient Stated Goal: return home Potential to Achieve Goals: Good Progress towards PT goals: Progressing toward goals    Frequency  Min 3X/week    PT Plan      Co-evaluation             End of Session Equipment Utilized During Treatment: Gait belt Activity Tolerance: Patient limited by fatigue Patient left: in chair;with call bell/phone within reach;with chair alarm set;with family/visitor present     Time: [id number] PT Time Calculation (min) (ACUTE ONLY): 19 min  Charges:  $Gait Training: 8-22 mins  G Codes:      Cristela Blue 08/31/2015, 11:05 AM  Governor Rooks, PTA pager [phone]

## 2015-08-31 NOTE — Progress Notes (Signed)
Nsg Discharge Note  Admit Date:  08/27/2015 Discharge date: 08/31/2015   [name] to be D/C'd Home per MD order.  AVS completed.  Copy for chart, and copy for patient signed, and dated. Patient/caregiver able to verbalize understanding.  Discharge Medication:   Medication List    TAKE these medications        amoxicillin 500 MG capsule  Commonly known as:  AMOXIL  Take 1 capsule (500 mg total) by mouth 3 (three) times daily.     baclofen 20 MG tablet  Commonly known as:  LIORESAL  Take 1 tablet (20 mg total) by mouth at bedtime.     bethanechol 50 MG tablet  Commonly known as:  URECHOLINE  Take 1 tablet by mouth 2 (two) times daily.     feeding supplement (PRO-STAT SUGAR FREE 64) Liqd  Take 30 mLs by mouth 3 (three) times daily.     fluconazole 100 MG tablet  Commonly known as:  DIFLUCAN  Take 1 tablet by mouth daily.     FLUoxetine 10 MG tablet  Commonly known as:  PROZAC  Take 10 mg by mouth daily.     fluticasone 50 MCG/ACT nasal spray  Commonly known as:  FLONASE  Place 1 spray into both nostrils daily.     gabapentin 100 MG capsule  Commonly known as:  NEURONTIN  Take 100 mg by mouth 3 (three) times daily.     Insulin Degludec 200 UNIT/ML Sopn  Commonly known as:  TRESIBA FLEXTOUCH  Inject 20 Units into the skin daily.     lactulose 10 GM/15ML solution  Commonly known as:  CHRONULAC  Take 45 mLs (30 g total) by mouth 3 (three) times daily.     multivitamin with minerals Tabs tablet  Take 1 tablet by mouth daily.     pantoprazole 40 MG tablet  Commonly known as:  PROTONIX  TAKE 1 TABLET (40 MG TOTAL) BY MOUTH DAILY.     tamsulosin 0.4 MG Caps capsule  Commonly known as:  FLOMAX  Take 1 capsule by mouth 2 (two) times daily.     Vitamin D (Ergocalciferol) 50000 units Caps capsule  Commonly known as:  DRISDOL  Take 50,000 Units by mouth every 7 (seven) days. Mondays.     XIFAXAN 550 MG Tabs tablet  Generic drug:  rifaximin  TAKE 1 TABLET  (550 MG TOTAL) BY MOUTH 2 (TWO) TIMES DAILY.        Discharge Assessment: Filed Vitals:   08/30/15 2228 08/31/15 0541  BP: 103/61 111/57  Pulse: 75 73  Temp: 98.3 F (36.8 C) 98.2 F (36.8 C)  Resp: 18 18   Skin clean, dry and intact without evidence of skin break down, no evidence of skin tears noted. IV catheter discontinued intact. Site without signs and symptoms of complications - no redness or edema noted at insertion site, patient denies c/o pain - only slight tenderness at site.  Dressing with slight pressure applied.  D/c Instructions-Education: Discharge instructions given to patient/family with verbalized understanding. D/c education completed with patient/family including follow up instructions, medication list, d/c activities limitations if indicated, with other d/c instructions as indicated by MD - patient able to verbalize understanding, all questions fully answered. Patient instructed to return to ED, call 911, or call MD for any changes in condition.  Patient escorted via [place], and D/C home via private auto.  Salley Slaughter, RN 08/31/2015 11:49 AM

## 2015-08-31 NOTE — Care Management (Signed)
CM faxed face to face, demographics to [facility] @ [phone] for home health PT,RN. Pt and wife both made aware per CM. Whitman Hero RN,BSN,CM [phone]

## 2015-09-02 LAB — CULTURE, BODY FLUID W GRAM STAIN -BOTTLE: Culture: NO GROWTH

## 2015-09-02 LAB — CULTURE, BODY FLUID-BOTTLE

## 2015-09-03 ENCOUNTER — Ambulatory Visit (HOSPITAL_COMMUNITY)
Admission: RE | Admit: 2015-09-03 | Discharge: 2015-09-03 | Disposition: A | Discharge status: Home / Self Care | Payer: Managed Care, Other (non HMO) | Source: Ambulatory Visit | Attending: Internal Medicine | Admitting: Internal Medicine

## 2015-09-03 DIAGNOSIS — K7460 Unspecified cirrhosis of liver: Secondary | ICD-10-CM | POA: Diagnosis not present

## 2015-09-03 DIAGNOSIS — R188 Other ascites: Secondary | ICD-10-CM | POA: Diagnosis present

## 2015-09-03 LAB — BODY FLUID CELL COUNT WITH DIFFERENTIAL
EOS FL: 0 %
LYMPHS FL: 8 %
MONOCYTE-MACROPHAGE-SEROUS FLUID: 90 % (ref 50–90)
NEUTROPHIL FLUID: 2 % (ref 0–25)
Total Nucleated Cell Count, Fluid: 68 cu mm (ref 0–1000)

## 2015-09-03 LAB — PATHOLOGIST SMEAR REVIEW

## 2015-09-03 MED ORDER — ALBUMIN HUMAN 25 % IV SOLN
25.0000 g | Freq: Once | INTRAVENOUS | Status: AC
  Administered 2015-09-03: 25 g via INTRAVENOUS
  Filled 2015-09-03: qty 100

## 2015-09-03 MED ORDER — LIDOCAINE HCL (PF) 1 % IJ SOLN
INTRAMUSCULAR | Status: AC
  Filled 2015-09-03: qty 10

## 2015-09-03 NOTE — Procedures (Signed)
Ultrasound-guided diagnostic and therapeutic paracentesis performed yielding 3 liters of clear yellow colored fluid. No immediate complications.  25g of albumin to be given in short stay.  Maryana Pittmon E 2:49 PM 09/03/2015

## 2015-09-05 ENCOUNTER — Encounter: Payer: Managed Care, Other (non HMO) | Admitting: Physical Medicine & Rehabilitation

## 2015-09-06 ENCOUNTER — Encounter (HOSPITAL_COMMUNITY): Payer: Self-pay | Admitting: Emergency Medicine

## 2015-09-06 ENCOUNTER — Inpatient Hospital Stay (HOSPITAL_COMMUNITY)
Admission: EM | Admit: 2015-09-06 | Discharge: 2015-09-10 | DRG: 441 | Disposition: A | Discharge status: Home Health | Payer: Managed Care, Other (non HMO) | Attending: Internal Medicine | Admitting: Internal Medicine

## 2015-09-06 ENCOUNTER — Ambulatory Visit (HOSPITAL_COMMUNITY): Admission: RE | Admit: 2015-09-06 | Payer: Managed Care, Other (non HMO) | Source: Ambulatory Visit

## 2015-09-06 ENCOUNTER — Emergency Department (HOSPITAL_COMMUNITY): Payer: Managed Care, Other (non HMO)

## 2015-09-06 DIAGNOSIS — N400 Enlarged prostate without lower urinary tract symptoms: Secondary | ICD-10-CM | POA: Diagnosis present

## 2015-09-06 DIAGNOSIS — R4182 Altered mental status, unspecified: Secondary | ICD-10-CM | POA: Diagnosis present

## 2015-09-06 DIAGNOSIS — M4640 Discitis, unspecified, site unspecified: Secondary | ICD-10-CM | POA: Diagnosis present

## 2015-09-06 DIAGNOSIS — Z87442 Personal history of urinary calculi: Secondary | ICD-10-CM | POA: Diagnosis not present

## 2015-09-06 DIAGNOSIS — Z8249 Family history of ischemic heart disease and other diseases of the circulatory system: Secondary | ICD-10-CM | POA: Diagnosis not present

## 2015-09-06 DIAGNOSIS — E1140 Type 2 diabetes mellitus with diabetic neuropathy, unspecified: Secondary | ICD-10-CM | POA: Diagnosis present

## 2015-09-06 DIAGNOSIS — K745 Biliary cirrhosis, unspecified: Secondary | ICD-10-CM | POA: Diagnosis present

## 2015-09-06 DIAGNOSIS — E1122 Type 2 diabetes mellitus with diabetic chronic kidney disease: Secondary | ICD-10-CM | POA: Diagnosis present

## 2015-09-06 DIAGNOSIS — E1121 Type 2 diabetes mellitus with diabetic nephropathy: Secondary | ICD-10-CM | POA: Diagnosis present

## 2015-09-06 DIAGNOSIS — L8990 Pressure ulcer of unspecified site, unspecified stage: Secondary | ICD-10-CM | POA: Diagnosis present

## 2015-09-06 DIAGNOSIS — D638 Anemia in other chronic diseases classified elsewhere: Secondary | ICD-10-CM | POA: Diagnosis present

## 2015-09-06 DIAGNOSIS — K7581 Nonalcoholic steatohepatitis (NASH): Secondary | ICD-10-CM

## 2015-09-06 DIAGNOSIS — F329 Major depressive disorder, single episode, unspecified: Secondary | ICD-10-CM | POA: Diagnosis present

## 2015-09-06 DIAGNOSIS — R188 Other ascites: Secondary | ICD-10-CM

## 2015-09-06 DIAGNOSIS — K7290 Hepatic failure, unspecified without coma: Secondary | ICD-10-CM

## 2015-09-06 DIAGNOSIS — D6959 Other secondary thrombocytopenia: Secondary | ICD-10-CM | POA: Diagnosis present

## 2015-09-06 DIAGNOSIS — Z66 Do not resuscitate: Secondary | ICD-10-CM | POA: Diagnosis present

## 2015-09-06 DIAGNOSIS — Z792 Long term (current) use of antibiotics: Secondary | ICD-10-CM

## 2015-09-06 DIAGNOSIS — Z833 Family history of diabetes mellitus: Secondary | ICD-10-CM

## 2015-09-06 DIAGNOSIS — K7460 Unspecified cirrhosis of liver: Secondary | ICD-10-CM | POA: Diagnosis present

## 2015-09-06 DIAGNOSIS — I8500 Esophageal varices without bleeding: Secondary | ICD-10-CM | POA: Diagnosis present

## 2015-09-06 DIAGNOSIS — E43 Unspecified severe protein-calorie malnutrition: Secondary | ICD-10-CM | POA: Diagnosis present

## 2015-09-06 DIAGNOSIS — K219 Gastro-esophageal reflux disease without esophagitis: Secondary | ICD-10-CM | POA: Diagnosis present

## 2015-09-06 DIAGNOSIS — R7989 Other specified abnormal findings of blood chemistry: Secondary | ICD-10-CM | POA: Insufficient documentation

## 2015-09-06 DIAGNOSIS — K767 Hepatorenal syndrome: Secondary | ICD-10-CM | POA: Diagnosis present

## 2015-09-06 DIAGNOSIS — K7200 Acute and subacute hepatic failure without coma: Secondary | ICD-10-CM | POA: Diagnosis present

## 2015-09-06 DIAGNOSIS — N183 Chronic kidney disease, stage 3 (moderate): Secondary | ICD-10-CM | POA: Diagnosis present

## 2015-09-06 DIAGNOSIS — E872 Acidosis: Secondary | ICD-10-CM

## 2015-09-06 DIAGNOSIS — F32A Depression, unspecified: Secondary | ICD-10-CM | POA: Diagnosis present

## 2015-09-06 DIAGNOSIS — K7682 Hepatic encephalopathy: Secondary | ICD-10-CM | POA: Diagnosis present

## 2015-09-06 LAB — PATHOLOGIST SMEAR REVIEW

## 2015-09-06 LAB — COMPREHENSIVE METABOLIC PANEL
ALT: 27 U/L (ref 17–63)
AST: 33 U/L (ref 15–41)
Albumin: 2.9 g/dL — ABNORMAL LOW (ref 3.5–5.0)
Alkaline Phosphatase: 104 U/L (ref 38–126)
Anion gap: 7 (ref 5–15)
BILIRUBIN TOTAL: 1.2 mg/dL (ref 0.3–1.2)
BUN: 57 mg/dL — AB (ref 6–20)
CO2: 16 mmol/L — ABNORMAL LOW (ref 22–32)
CREATININE: 2.39 mg/dL — AB (ref 0.61–1.24)
Calcium: 8.7 mg/dL — ABNORMAL LOW (ref 8.9–10.3)
Chloride: 108 mmol/L (ref 101–111)
GFR calc Af Amer: 32 mL/min — ABNORMAL LOW (ref >60)
GFR, EST NON AFRICAN AMERICAN: 27 mL/min — AB (ref >60)
Glucose, Bld: 312 mg/dL — ABNORMAL HIGH (ref 65–99)
POTASSIUM: 5 mmol/L (ref 3.5–5.1)
Sodium: 131 mmol/L — ABNORMAL LOW (ref 135–145)
TOTAL PROTEIN: 5.1 g/dL — AB (ref 6.5–8.1)

## 2015-09-06 LAB — MAGNESIUM: Magnesium: 2.6 mg/dL — ABNORMAL HIGH (ref 1.7–2.4)

## 2015-09-06 LAB — CBC WITH DIFFERENTIAL/PLATELET
Basophils Absolute: 0 10*3/uL (ref 0.0–0.1)
Basophils Relative: 1 %
EOS PCT: 3 %
Eosinophils Absolute: 0.1 10*3/uL (ref 0.0–0.7)
HCT: 28.2 % — ABNORMAL LOW (ref 39.0–52.0)
HEMOGLOBIN: 9.8 g/dL — AB (ref 13.0–17.0)
LYMPHS ABS: 0.3 10*3/uL — AB (ref 0.7–4.0)
LYMPHS PCT: 10 %
MCH: 30.2 pg (ref 26.0–34.0)
MCHC: 34.8 g/dL (ref 30.0–36.0)
MCV: 87 fL (ref 78.0–100.0)
MONO ABS: 0.5 10*3/uL (ref 0.1–1.0)
Monocytes Relative: 16 %
NEUTROS ABS: 2.4 10*3/uL (ref 1.7–7.7)
Neutrophils Relative %: 71 %
Platelets: 33 10*3/uL — ABNORMAL LOW (ref 150–400)
RBC: 3.24 MIL/uL — AB (ref 4.22–5.81)
RDW: 15.8 % — ABNORMAL HIGH (ref 11.5–15.5)
WBC: 3.3 10*3/uL — ABNORMAL LOW (ref 4.0–10.5)

## 2015-09-06 LAB — I-STAT TROPONIN, ED: Troponin i, poc: 0 ng/mL (ref 0.00–0.08)

## 2015-09-06 LAB — PROTIME-INR
INR: 1.64 — ABNORMAL HIGH (ref 0.00–1.49)
PROTHROMBIN TIME: 19.4 s — AB (ref 11.6–15.2)

## 2015-09-06 LAB — GLUCOSE, CAPILLARY: Glucose-Capillary: 230 mg/dL — ABNORMAL HIGH (ref 65–99)

## 2015-09-06 LAB — AMMONIA: AMMONIA: 178 umol/L — AB (ref 9–35)

## 2015-09-06 LAB — I-STAT CG4 LACTIC ACID, ED
LACTIC ACID, VENOUS: 2.48 mmol/L — AB (ref 0.5–2.0)
Lactic Acid, Venous: 2.12 mmol/L (ref 0.5–2.0)

## 2015-09-06 LAB — PHOSPHORUS: Phosphorus: 4 mg/dL (ref 2.5–4.6)

## 2015-09-06 MED ORDER — SODIUM CHLORIDE 0.9 % IV BOLUS (SEPSIS)
500.0000 mL | Freq: Once | INTRAVENOUS | Status: AC
  Administered 2015-09-06: 500 mL via INTRAVENOUS

## 2015-09-06 MED ORDER — LACTULOSE ENEMA
300.0000 mL | Freq: Once | ORAL | Status: DC
  Filled 2015-09-06: qty 300

## 2015-09-06 MED ORDER — LACTULOSE ENEMA
300.0000 mL | Freq: Three times a day (TID) | ORAL | Status: DC
  Filled 2015-09-06: qty 300

## 2015-09-06 MED ORDER — SODIUM CHLORIDE 0.9 % IV SOLN: INTRAVENOUS | Status: DC

## 2015-09-06 MED ORDER — FLUOXETINE HCL 10 MG PO CAPS
10.0000 mg | ORAL_CAPSULE | Freq: Every day | ORAL | Status: DC
  Administered 2015-09-07 – 2015-09-10 (×4): 10 mg via ORAL
  Filled 2015-09-06 (×6): qty 1

## 2015-09-06 MED ORDER — CIPROFLOXACIN IN D5W 400 MG/200ML IV SOLN
400.0000 mg | Freq: Two times a day (BID) | INTRAVENOUS | Status: DC
Start: 2015-09-06 — End: 2015-09-07
  Administered 2015-09-06 – 2015-09-07 (×2): 400 mg via INTRAVENOUS
  Filled 2015-09-06 (×2): qty 200

## 2015-09-06 MED ORDER — SODIUM CHLORIDE 0.9 % IV SOLN
INTRAVENOUS | Status: DC
  Administered 2015-09-06: 20:00:00 via INTRAVENOUS

## 2015-09-06 MED ORDER — RIFAXIMIN 550 MG PO TABS
550.0000 mg | ORAL_TABLET | Freq: Two times a day (BID) | ORAL | Status: DC
  Administered 2015-09-07 – 2015-09-10 (×8): 550 mg via ORAL
  Filled 2015-09-06 (×10): qty 1

## 2015-09-06 MED ORDER — LACTULOSE ENEMA
300.0000 mL | Freq: Three times a day (TID) | ORAL | Status: DC
  Administered 2015-09-07: 300 mL via RECTAL
  Filled 2015-09-06 (×4): qty 300

## 2015-09-06 MED ORDER — PRO-STAT SUGAR FREE PO LIQD
30.0000 mL | Freq: Every day | ORAL | Status: DC
  Administered 2015-09-06 – 2015-09-10 (×5): 30 mL via ORAL
  Filled 2015-09-06 (×5): qty 30

## 2015-09-06 MED ORDER — LACTULOSE ENEMA
300.0000 mL | Freq: Once | ORAL | Status: AC
  Administered 2015-09-06: 300 mL via RECTAL
  Filled 2015-09-06: qty 300

## 2015-09-06 MED ORDER — RISAQUAD PO CAPS
2.0000 | ORAL_CAPSULE | Freq: Every day | ORAL | Status: DC
  Administered 2015-09-06 – 2015-09-10 (×5): 2 via ORAL
  Filled 2015-09-06 (×6): qty 2

## 2015-09-06 MED ORDER — TAMSULOSIN HCL 0.4 MG PO CAPS
0.4000 mg | ORAL_CAPSULE | Freq: Two times a day (BID) | ORAL | Status: DC
  Administered 2015-09-07 – 2015-09-10 (×8): 0.4 mg via ORAL
  Filled 2015-09-06 (×9): qty 1

## 2015-09-06 MED ORDER — GABAPENTIN 100 MG PO CAPS
100.0000 mg | ORAL_CAPSULE | Freq: Three times a day (TID) | ORAL | Status: DC
  Administered 2015-09-07 – 2015-09-10 (×11): 100 mg via ORAL
  Filled 2015-09-06 (×11): qty 1

## 2015-09-06 MED ORDER — PANTOPRAZOLE SODIUM 40 MG PO TBEC
80.0000 mg | DELAYED_RELEASE_TABLET | Freq: Every day | ORAL | Status: DC
  Administered 2015-09-06 – 2015-09-10 (×5): 80 mg via ORAL
  Filled 2015-09-06 (×5): qty 2

## 2015-09-06 MED ORDER — ONDANSETRON HCL 4 MG PO TABS: 4.0000 mg | ORAL_TABLET | Freq: Four times a day (QID) | ORAL | Status: DC | PRN

## 2015-09-06 MED ORDER — ONDANSETRON HCL 4 MG/2ML IJ SOLN: 4.0000 mg | Freq: Four times a day (QID) | INTRAMUSCULAR | Status: DC | PRN

## 2015-09-06 MED ORDER — LACTULOSE 10 GM/15ML PO SOLN
30.0000 g | Freq: Once | ORAL | Status: AC
  Administered 2015-09-06: 30 g via ORAL
  Filled 2015-09-06: qty 45

## 2015-09-06 MED ORDER — SODIUM CHLORIDE 0.9% FLUSH
3.0000 mL | Freq: Two times a day (BID) | INTRAVENOUS | Status: DC
  Administered 2015-09-07 – 2015-09-10 (×8): 3 mL via INTRAVENOUS

## 2015-09-06 MED ORDER — BACLOFEN 20 MG PO TABS
20.0000 mg | ORAL_TABLET | Freq: Every day | ORAL | Status: DC
  Administered 2015-09-07 – 2015-09-09 (×3): 20 mg via ORAL
  Filled 2015-09-06 (×5): qty 1

## 2015-09-06 MED ORDER — BETHANECHOL CHLORIDE 25 MG PO TABS
50.0000 mg | ORAL_TABLET | Freq: Two times a day (BID) | ORAL | Status: DC
  Administered 2015-09-07 – 2015-09-10 (×7): 50 mg via ORAL
  Filled 2015-09-06 (×13): qty 2

## 2015-09-06 MED ORDER — METRONIDAZOLE IN NACL 5-0.79 MG/ML-% IV SOLN
500.0000 mg | Freq: Three times a day (TID) | INTRAVENOUS | Status: DC
  Administered 2015-09-06 – 2015-09-07 (×3): 500 mg via INTRAVENOUS
  Filled 2015-09-06 (×3): qty 100

## 2015-09-06 MED ORDER — INSULIN ASPART 100 UNIT/ML ~~LOC~~ SOLN
0.0000 [IU] | Freq: Three times a day (TID) | SUBCUTANEOUS | Status: DC
  Administered 2015-09-07: 1 [IU] via SUBCUTANEOUS
  Administered 2015-09-07: 3 [IU] via SUBCUTANEOUS
  Administered 2015-09-08: 5 [IU] via SUBCUTANEOUS
  Administered 2015-09-08: 2 [IU] via SUBCUTANEOUS
  Administered 2015-09-08: 3 [IU] via SUBCUTANEOUS
  Administered 2015-09-09: 7 [IU] via SUBCUTANEOUS
  Administered 2015-09-09: 5 [IU] via SUBCUTANEOUS
  Administered 2015-09-09: 2 [IU] via SUBCUTANEOUS
  Administered 2015-09-10: 5 [IU] via SUBCUTANEOUS
  Administered 2015-09-10: 3 [IU] via SUBCUTANEOUS
  Administered 2015-09-10: 7 [IU] via SUBCUTANEOUS

## 2015-09-06 MED ORDER — INSULIN ASPART 100 UNIT/ML ~~LOC~~ SOLN
0.0000 [IU] | Freq: Every day | SUBCUTANEOUS | Status: DC
  Administered 2015-09-07 – 2015-09-09 (×3): 2 [IU] via SUBCUTANEOUS

## 2015-09-06 NOTE — ED Notes (Signed)
Pt here from home , pt was on the way to get his paracentesis when family states that he became weak , EMS said his s/bp was in the 71's

## 2015-09-06 NOTE — ED Notes (Signed)
Pt drank Lactulose without difficulty

## 2015-09-06 NOTE — ED Notes (Signed)
Attempted report 

## 2015-09-06 NOTE — ED Provider Notes (Signed)
CSN: [id number]     Arrival date & time 09/06/15  1256 History   First MD Initiated Contact with Patient 09/06/15 1305     Chief Complaint  Patient presents with  . Hypotension  . Abdominal Pain     (Consider location/radiation/quality/duration/timing/severity/associated sxs/prior Treatment) The history is provided by the patient, the spouse and the EMS personnel.  [name] is a 65 y.o. 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 Patient gets paracentesis every other week. Patient was recently admitted for hepatic encephalopathy and has been taking lactulose 4 times a day. He did have a bowel movement yesterday. However as per the wife, patient has been more lethargic since yesterday. He is less responsive than usual today. He is supposed to take him to get a paracentesis today but he was too weak to get out of bed. She called EMS, who noticed SBP around 90. Denies fever or cough.   Level V caveat- AMS   Past Medical History  Diagnosis Date  . Ascites   . Acute hepatic encephalopathy ([place])   . Acute renal failure superimposed on stage 3 chronic kidney disease ([place])   . Anemia of chronic disease   . Bleeding esophageal varices ([place])   . Coagulopathy ([place])   . Diabetes mellitus type II, controlled ([place])   . Depression   . Diabetic neuropathy ([place])   . Esophageal stricture   . NASH (nonalcoholic steatohepatitis)   . Protein-calorie malnutrition, severe ([place])   . Secondary biliary cirrhosis ([place])   . Thrombocytopenia ([place])   . 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 Kidney stone surgery  yrs ago  . Esophagogastroduodenoscopy  08/01/2011    Procedure: ESOPHAGOGASTRODUODENOSCOPY (EGD);  Surgeon: Scarlette Shorts, MD;  Location: [facility];  Service: Endoscopy;  Laterality: N/A;  . Esophagogastroduodenoscopy N/A 10/27/2012    Procedure:  ESOPHAGOGASTRODUODENOSCOPY (EGD);  Surgeon: Inda Castle, MD;  Location: [facility];  Service: Endoscopy;  Laterality: N/A;  . Esophagogastroduodenoscopy N/A 11/22/2012    Procedure: ESOPHAGOGASTRODUODENOSCOPY (EGD);  Surgeon: Irene Shipper, MD;  Location: [facility];  Service: Endoscopy;  Laterality: N/A;  . Colonoscopy    . Upper gastrointestinal endoscopy    . Esophagogastroduodenoscopy N/A 11/29/2013    Procedure: ESOPHAGOGASTRODUODENOSCOPY (EGD);  Surgeon: Irene Shipper, MD;  Location: [facility];  Service: Endoscopy;  Laterality: N/A;  . Esophageal banding Bilateral 11/29/2013    Procedure: ESOPHAGEAL BANDING;  Surgeon: Irene Shipper, MD;  Location: [facility];  Service: Endoscopy;  Laterality: Bilateral;  . Esophagogastroduodenoscopy N/A 06/13/2014    Procedure: ESOPHAGOGASTRODUODENOSCOPY (EGD);  Surgeon: Irene Shipper, MD;  Location: [facility];  Service: Endoscopy;  Laterality: N/A;  . Esophageal banding N/A 06/13/2014    Procedure: ESOPHAGEAL BANDING;  Surgeon: Irene Shipper, MD;  Location: [facility];  Service: Endoscopy;  Laterality: N/A;   Family History  Problem Relation Age of Onset  . Colon cancer Neg Hx   . Anesthesia problems Neg Hx   . Hypotension Neg Hx   . Malignant hyperthermia Neg Hx   . Pseudochol deficiency Neg Hx   . Diabetes Father   . Heart disease Father   . Aneurysm Mother   . Heart disease Other     neice  . Diabetes      niece   Social History  Substance Use Topics  . Smoking status: Never Smoker   . Smokeless  tobacco: Never Used  . Alcohol Use: No     Comment: very rare    Review of Systems  Unable to perform ROS: Mental status change  Neurological: Positive for weakness.  All other systems reviewed and are negative.     Allergies  Review of patient's allergies indicates no known allergies.  Home Medications   Prior to Admission medications   Medication Sig Start Date End Date Taking? Authorizing Provider  Amino Acids-Protein  Hydrolys (FEEDING SUPPLEMENT, PRO-STAT SUGAR FREE 64,) LIQD Take 30 mLs by mouth 3 (three) times daily. Patient taking differently: Take 30 mLs by mouth daily.  07/31/15   Belkys A Regalado, MD  amoxicillin (AMOXIL) 500 MG capsule Take 1 capsule (500 mg total) by mouth 3 (three) times daily. 08/13/15   Truman Hayward, MD  baclofen (LIORESAL) 20 MG tablet Take 1 tablet (20 mg total) by mouth at bedtime. 06/11/15   Meredith Staggers, MD  bethanechol (URECHOLINE) 50 MG tablet Take 1 tablet by mouth 2 (two) times daily.  12/19/14   Historical Provider, MD  fluconazole (DIFLUCAN) 100 MG tablet Take 1 tablet by mouth daily. 07/19/15   Historical Provider, MD  FLUoxetine (PROZAC) 10 MG tablet Take 10 mg by mouth daily.    Historical Provider, MD  fluticasone (FLONASE) 50 MCG/ACT nasal spray Place 1 spray into both nostrils daily. 08/02/15   Historical Provider, MD  gabapentin (NEURONTIN) 100 MG capsule Take 100 mg by mouth 3 (three) times daily.     Historical Provider, MD  Insulin Degludec (TRESIBA FLEXTOUCH) 200 UNIT/ML SOPN Inject 20 Units into the skin daily. 07/31/15   Belkys A Regalado, MD  lactulose (CHRONULAC) 10 GM/15ML solution Take 45 mLs (30 g total) by mouth 3 (three) times daily. 08/14/15   Irene Shipper, MD  Multiple Vitamin (MULTIVITAMIN WITH MINERALS) TABS tablet Take 1 tablet by mouth daily.    Historical Provider, MD  pantoprazole (PROTONIX) 40 MG tablet TAKE 1 TABLET (40 MG TOTAL) BY MOUTH DAILY. 11/13/14   Irene Shipper, MD  tamsulosin (FLOMAX) 0.4 MG CAPS capsule Take 1 capsule by mouth 2 (two) times daily.  12/19/14   Historical Provider, MD  Vitamin D, Ergocalciferol, (DRISDOL) 50000 UNITS CAPS capsule Take 50,000 Units by mouth every 7 (seven) days. Mondays. 03/29/15   Historical Provider, MD  XIFAXAN 550 MG TABS tablet TAKE 1 TABLET (550 MG TOTAL) BY MOUTH 2 (TWO) TIMES DAILY. 08/27/15   Irene Shipper, MD   BP 120/82 mmHg  Pulse 73  Temp(Src) 97.7 F (36.5 C) (Oral)  Resp 14  SpO2  100% Physical Exam  Constitutional:  Confused, tired, difficult to arouse   HENT:  Head: Normocephalic.  Eyes: Pupils are equal, round, and reactive to light.  Jaundiced   Neck: Normal range of motion.  Cardiovascular: Normal rate, regular rhythm and normal heart sounds.   Pulmonary/Chest:  Diminished bilateral bases   Abdominal:  + fluid wave, nontender   Musculoskeletal:  2+ edema bilaterally   Neurological:  Difficult to arouse. ? [place]. Moving all extremities. Difficult following commands   Skin: Skin is warm and dry.  Psychiatric:  Unable   Nursing note and vitals reviewed.   ED Course  Procedures (including critical care time) Labs Review Labs Reviewed  CBC WITH DIFFERENTIAL/PLATELET - Abnormal; Notable for the following:    WBC 3.3 (*)    RBC 3.24 (*)    Hemoglobin 9.8 (*)    HCT 28.2 (*)    RDW  15.8 (*)    Platelets 33 (*)    Lymphs Abs 0.3 (*)    All other components within normal limits  PROTIME-INR - Abnormal; Notable for the following:    Prothrombin Time 19.4 (*)    INR 1.64 (*)    All other components within normal limits  COMPREHENSIVE METABOLIC PANEL - Abnormal; Notable for the following:    Sodium 131 (*)    CO2 16 (*)    Glucose, Bld 312 (*)    BUN 57 (*)    Creatinine, Ser 2.39 (*)    Calcium 8.7 (*)    Total Protein 5.1 (*)    Albumin 2.9 (*)    GFR calc non Af Amer 27 (*)    GFR calc Af Amer 32 (*)    All other components within normal limits  AMMONIA - Abnormal; Notable for the following:    Ammonia 178 (*)    All other components within normal limits  I-STAT CG4 LACTIC ACID, ED - Abnormal; Notable for the following:    Lactic Acid, Venous 2.48 (*)    All other components within normal limits  URINALYSIS, ROUTINE W REFLEX MICROSCOPIC (NOT AT [facility])  Randolm Idol, ED    Imaging Review Dg Chest Port 1 View  09/06/2015  CLINICAL DATA:  Weakness, shortness of breath today, hypotension, history of hypertension, end-stage liver  disease, acute on chronic renal failure, esophageal varices, type II diabetes mellitus, secondary biliary cirrhosis EXAM: PORTABLE CHEST 1 VIEW COMPARISON:  Portable exam 1331 hours compared to 08/27/2015 FINDINGS: Very low lung volumes. Enlargement of cardiac silhouette. Mediastinal contours normal. Bibasilar atelectasis and minimal central peribronchial thickening. No gross infiltrate, pleural effusion or pneumothorax. Bones demineralized. IMPRESSION: Enlargement of cardiac silhouette. Bronchitic changes with very low lung volumes and bibasilar atelectasis. Electronically Signed   By: Lavonia Dana M.D.   On: 09/06/2015 13:47   I have personally reviewed and evaluated these images and lab results as part of my medical decision-making.   EKG Interpretation   Date/Time:  Thursday September 06 2015 13:50:36 EST Ventricular Rate:  67 PR Interval:  288 QRS Duration: 90 QT Interval:  373 QTC Calculation: 394 R Axis:   115 Text Interpretation:  Right and left arm electrode reversal,  interpretation assumes no reversal Sinus rhythm Prolonged PR interval  Right axis deviation Low voltage, precordial leads Probable anteroseptal  infarct, old No significant change since last tracing Confirmed by YAO   MD, DAVID ([id number]) on 09/06/2015 1:57:16 PM      MDM   Final diagnoses:  None   [name] is a 65 y.o. male here with AMS, hypotension. Per wife, his BP is baseline around mid 90s to low 100s. BP on arrival is 120/82. He is confused. Consider sepsis vs hepatic encephalopathy. Abdomen has fluid wave but nontender so I doubt SBP. Will get labs, CXR, UA, lactate, ammonia. Will likely admit.   3:14 PM Ammonia 178. INR 1.6. CXR showed no pneumonia. Given lactulose. Will admit for hepatic encephalopathy.     Wandra Arthurs, MD 09/06/15 [phone]

## 2015-09-06 NOTE — H&P (Signed)
[facility] History and Physical  JALEEN CRAIGIE [id number] DOB: 1950-12-10 DOA: 09/06/2015  Referring physician: Dr Darl Householder - [facility] PCP: [name], MD   Chief Complaint: Reina Fuse  HPI: [name] is a 65 y.o. male  Level V caveat: Patient presenting in altered mental state and unable to provide history. History provided by EDP and patient's wife who is his caretaker.  Patient discharged from [facility] after a four-day stay on 08/31/2015. Patient had been admitted for hepatic encephalopathy. After that time patient was mentally clear until last night when he began acting odd and very weak. Patient did not seem to know where he was. Wife states that after leaving [facility] on the 24th he was at his baseline with regards to mentation as well as physical health. Diet while poor was at baseline. Patient was able to angulate with walker. Patient has had increasing fatigue over the last several days. Patient was compliant with all home medications including his lactulose 4 times daily. Of note however patient has only had one bowel movement since being discharged from hospital on the 24th. Patient does go for paracentesis approximately every 3 days and he gets 3 L of fluid off at each of those draws. Patient has not had any complaints of abdominal pain, fevers, nausea, vomiting, chest pain, palpitations, cough, shortness of breath.   Review of Systems:  Unable to obtain further review systems due to patient's mental status.  Past Medical History  Diagnosis Date  . Ascites   . Acute hepatic encephalopathy ([place])   . Acute renal failure superimposed on stage 3 chronic kidney disease ([place])   . Anemia of chronic disease   . Bleeding esophageal varices ([place])   . Coagulopathy ([place])   . Diabetes mellitus type II, controlled ([place])   . Depression   . Diabetic neuropathy ([place])   . Esophageal stricture   . NASH (nonalcoholic steatohepatitis)   . Protein-calorie malnutrition,  severe ([place])   . Secondary biliary cirrhosis ([place])   . Thrombocytopenia ([place])   . 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 Kidney stone surgery  yrs ago  . Esophagogastroduodenoscopy  08/01/2011    Procedure: ESOPHAGOGASTRODUODENOSCOPY (EGD);  Surgeon: Scarlette Shorts, MD;  Location: [facility];  Service: Endoscopy;  Laterality: N/A;  . Esophagogastroduodenoscopy N/A 10/27/2012    Procedure: ESOPHAGOGASTRODUODENOSCOPY (EGD);  Surgeon: Inda Castle, MD;  Location: [facility];  Service: Endoscopy;  Laterality: N/A;  . Esophagogastroduodenoscopy N/A 11/22/2012    Procedure: ESOPHAGOGASTRODUODENOSCOPY (EGD);  Surgeon: Irene Shipper, MD;  Location: [facility];  Service: Endoscopy;  Laterality: N/A;  . Colonoscopy    . Upper gastrointestinal endoscopy    . Esophagogastroduodenoscopy N/A 11/29/2013    Procedure: ESOPHAGOGASTRODUODENOSCOPY (EGD);  Surgeon: Irene Shipper, MD;  Location: [facility];  Service: Endoscopy;  Laterality: N/A;  . Esophageal banding Bilateral 11/29/2013    Procedure: ESOPHAGEAL BANDING;  Surgeon: Irene Shipper, MD;  Location: [facility]COPY;  Service: Endoscopy;  Laterality: Bilateral;  . Esophagogastroduodenoscopy N/A 06/13/2014    Procedure: ESOPHAGOGASTRODUODENOSCOPY (EGD);  Surgeon: Irene Shipper, MD;  Location: [facility];  Service: Endoscopy;  Laterality: N/A;  . Esophageal banding N/A 06/13/2014    Procedure: ESOPHAGEAL BANDING;  Surgeon: Irene Shipper, MD;  Location: [facility] ENDOSCOPY;  Service: Endoscopy;  Laterality: N/A;   Social History:  reports that he has never smoked. He has never used smokeless tobacco. He reports that  he does not drink alcohol or use illicit drugs.  No Known Allergies  Family History  Problem Relation Age of Onset  . Colon cancer Neg Hx   . Anesthesia problems Neg Hx   . Hypotension Neg Hx   . Malignant hyperthermia Neg Hx   . Pseudochol deficiency Neg Hx   . Diabetes Father    . Heart disease Father   . Aneurysm Mother   . Heart disease Other     neice  . Diabetes      niece     Prior to Admission medications   Medication Sig Start Date End Date Taking? Authorizing Provider  Amino Acids-Protein Hydrolys (FEEDING SUPPLEMENT, PRO-STAT SUGAR FREE 64,) LIQD Take 30 mLs by mouth 3 (three) times daily. Patient taking differently: Take 30 mLs by mouth daily.  07/31/15   Belkys A Regalado, MD  amoxicillin (AMOXIL) 500 MG capsule Take 1 capsule (500 mg total) by mouth 3 (three) times daily. 08/13/15   Truman Hayward, MD  baclofen (LIORESAL) 20 MG tablet Take 1 tablet (20 mg total) by mouth at bedtime. 06/11/15   Meredith Staggers, MD  bethanechol (URECHOLINE) 50 MG tablet Take 1 tablet by mouth 2 (two) times daily.  12/19/14   Historical Provider, MD  fluconazole (DIFLUCAN) 100 MG tablet Take 1 tablet by mouth daily. 07/19/15   Historical Provider, MD  FLUoxetine (PROZAC) 10 MG tablet Take 10 mg by mouth daily.    Historical Provider, MD  fluticasone (FLONASE) 50 MCG/ACT nasal spray Place 1 spray into both nostrils daily. 08/02/15   Historical Provider, MD  gabapentin (NEURONTIN) 100 MG capsule Take 100 mg by mouth 3 (three) times daily.     Historical Provider, MD  Insulin Degludec (TRESIBA FLEXTOUCH) 200 UNIT/ML SOPN Inject 20 Units into the skin daily. 07/31/15   Belkys A Regalado, MD  lactulose (CHRONULAC) 10 GM/15ML solution Take 45 mLs (30 g total) by mouth 3 (three) times daily. 08/14/15   Irene Shipper, MD  Multiple Vitamin (MULTIVITAMIN WITH MINERALS) TABS tablet Take 1 tablet by mouth daily.    Historical Provider, MD  pantoprazole (PROTONIX) 40 MG tablet TAKE 1 TABLET (40 MG TOTAL) BY MOUTH DAILY. 11/13/14   Irene Shipper, MD  tamsulosin (FLOMAX) 0.4 MG CAPS capsule Take 1 capsule by mouth 2 (two) times daily.  12/19/14   Historical Provider, MD  Vitamin D, Ergocalciferol, (DRISDOL) 50000 UNITS CAPS capsule Take 50,000 Units by mouth every 7 (seven) days. Mondays. 03/29/15    Historical Provider, MD  XIFAXAN 550 MG TABS tablet TAKE 1 TABLET (550 MG TOTAL) BY MOUTH 2 (TWO) TIMES DAILY. 08/27/15   Irene Shipper, MD   Physical Exam: Filed Vitals:   09/06/15 1600 09/06/15 1615 09/06/15 1630 09/06/15 1645  BP: 109/77 103/68 107/69 104/74  Pulse: 72 73 72 74  Temp:      TempSrc:      Resp: 12 12 12 11   SpO2: 100% 100% 100% 99%    Wt Readings from Last 3 Encounters:  08/28/15 84.7 kg (186 lb 11.7 oz)  08/22/15 87.726 kg (193 lb 6.4 oz)  08/13/15 85.276 kg (188 lb)    General: Patient obtunded and laying in bed and laying in bed. We will wait briefly in order to talk. ENT: Dry MM Neck:  no LAD, masses or thyromegaly Cardiovascular:  RRR, II/VI systolic murmur. 1+ Le pittint edemg Respiratory:  CTA bilaterally, no w/r/r. Normal respiratory effort. Abdomen: Very distended but soft with  normal active bowel sounds, fluid wave present  Skin: Diffuse ecchymoses without jaundice  Musculoskeletal/Psych/Neuro: Unable to fully assess due to patient's mental status and obtunded state. No obvious joint effusions or bony abnormalities, patient will wake briefly and follow very basic commands.           Labs on Admission:  Basic Metabolic Panel:  Recent Labs Lab 08/31/15 0534 09/06/15 1418  NA 129* 131*  K 5.7* 5.0  CL 105 108  CO2 16* 16*  GLUCOSE 237* 312*  BUN 53* 57*  CREATININE 2.17* 2.39*  CALCIUM 9.3 8.7*  MG 2.4  --    Liver Function Tests:  Recent Labs Lab 08/31/15 0534 09/06/15 1418  AST 33 33  ALT 25 27  ALKPHOS 84 104  BILITOT 1.5* 1.2  PROT 5.7* 5.1*  ALBUMIN 3.1* 2.9*   No results for input(s): LIPASE, AMYLASE in the last 168 hours.  Recent Labs Lab 08/31/15 0550 09/06/15 1418  AMMONIA 65* 178*   CBC:  Recent Labs Lab 08/31/15 0534 09/06/15 1325  WBC 2.5* 3.3*  NEUTROABS 1.5* 2.4  HGB 9.5* 9.8*  HCT 28.5* 28.2*  MCV 87.2 87.0  PLT 19* 33*   Cardiac Enzymes: No results for input(s): CKTOTAL, CKMB, CKMBINDEX,  TROPONINI in the last 168 hours.  BNP (last 3 results)  Recent Labs  07/25/15 1144  BNP 75.7    ProBNP (last 3 results) No results for input(s): PROBNP in the last 8760 hours.   CREATININE: 2.39 mg/dL ABNORMAL (09/06/15 1418) Estimated creatinine clearance - 32.7 mL/min  CBG:  Recent Labs Lab 08/30/15 2226 08/31/15 0834 08/31/15 1156  GLUCAP 261* 201* 245*    Radiological Exams on Admission: Dg Chest Port 1 View  09/06/2015  CLINICAL DATA:  Weakness, shortness of breath today, hypotension, history of hypertension, end-stage liver disease, acute on chronic renal failure, esophageal varices, type II diabetes mellitus, secondary biliary cirrhosis EXAM: PORTABLE CHEST 1 VIEW COMPARISON:  Portable exam 1331 hours compared to 08/27/2015 FINDINGS: Very low lung volumes. Enlargement of cardiac silhouette. Mediastinal contours normal. Bibasilar atelectasis and minimal central peribronchial thickening. No gross infiltrate, pleural effusion or pneumothorax. Bones demineralized. IMPRESSION: Enlargement of cardiac silhouette. Bronchitic changes with very low lung volumes and bibasilar atelectasis. Electronically Signed   By: Lavonia Dana M.D.   On: 09/06/2015 13:47     Assessment/Plan Principal Problem:   Acute hepatic encephalopathy (HCC) Active Problems:   Ascites   Depression   Controlled type 2 diabetes mellitus with diabetic nephropathy, without long-term current use of insulin (HCC)   NASH (nonalcoholic steatohepatitis)   Hepatic encephalopathy (HCC)  Acute encephalopathy: Suspect hepatic given advanced cirrhosis and ammonia of 178. Patient with only 1 bowel movement since previous discharge from the hospital 7 days ago. Cannot rule out infection such as SBP at this point time as patient is nonverbal and w/ mixed clinical picture (lactic acid 2.48, AF VSS, WBC 3.3 without left shift, abdomen nontender, CXR without evidence of pneumonia, UA pending). MELD score 25 (previous of 17 at  [facility] just a short time ago ). Unsure why pt not on lasix, aldactone, or propranolol. - Stepdown - trend lactic acid - BCX, UCX - paracentecis w/ full labs/Cx - f/u UA - Lactulose enemas Q8 / rectal tube - continue xifaxan - Cipro/Flagyl - palliative care consult  DM: - SSI  Thrombocytopenia: 33. Likely from cirrhosis - Monitor.   MSK/neuropathic pain: - continue baclofen, neurontin  CKD: Cr 12.39. At baseline - BMET in am  GERD: - continue ppi  BPH: - continue flomax  Depression: - continue prozac  Code Status: DNR   DVT Prophylaxis: SCD Family Communication: wife Disposition Plan: Pending Improvement    Valente Fosberg J, MD Family Medicine [facility] [email or web address] Password [email or web address]

## 2015-09-06 NOTE — ED Notes (Signed)
CMP and Ammonia hemolyzed: needs recollect

## 2015-09-07 ENCOUNTER — Inpatient Hospital Stay (HOSPITAL_COMMUNITY): Payer: Managed Care, Other (non HMO)

## 2015-09-07 LAB — GLUCOSE, CAPILLARY
GLUCOSE-CAPILLARY: 142 mg/dL — AB (ref 65–99)
GLUCOSE-CAPILLARY: 202 mg/dL — AB (ref 65–99)
Glucose-Capillary: 149 mg/dL — ABNORMAL HIGH (ref 65–99)
Glucose-Capillary: 143 mg/dL — ABNORMAL HIGH (ref 65–99)

## 2015-09-07 LAB — CBC
HEMATOCRIT: 27.1 % — AB (ref 39.0–52.0)
Hemoglobin: 9.5 g/dL — ABNORMAL LOW (ref 13.0–17.0)
MCH: 30.5 pg (ref 26.0–34.0)
MCHC: 35.1 g/dL (ref 30.0–36.0)
MCV: 87.1 fL (ref 78.0–100.0)
PLATELETS: 21 10*3/uL — AB (ref 150–400)
RBC: 3.11 MIL/uL — AB (ref 4.22–5.81)
RDW: 15.5 % (ref 11.5–15.5)
WBC: 2.6 10*3/uL — AB (ref 4.0–10.5)

## 2015-09-07 LAB — PROTEIN, BODY FLUID

## 2015-09-07 LAB — COMPREHENSIVE METABOLIC PANEL
ALBUMIN: 3 g/dL — AB (ref 3.5–5.0)
ALT: 27 U/L (ref 17–63)
ANION GAP: 10 (ref 5–15)
AST: 31 U/L (ref 15–41)
Alkaline Phosphatase: 101 U/L (ref 38–126)
BUN: 57 mg/dL — AB (ref 6–20)
CHLORIDE: 105 mmol/L (ref 101–111)
CO2: 17 mmol/L — ABNORMAL LOW (ref 22–32)
Calcium: 9.3 mg/dL (ref 8.9–10.3)
Creatinine, Ser: 2.43 mg/dL — ABNORMAL HIGH (ref 0.61–1.24)
GFR calc Af Amer: 31 mL/min — ABNORMAL LOW (ref >60)
GFR, EST NON AFRICAN AMERICAN: 27 mL/min — AB (ref >60)
Glucose, Bld: 174 mg/dL — ABNORMAL HIGH (ref 65–99)
POTASSIUM: 5.1 mmol/L (ref 3.5–5.1)
Sodium: 132 mmol/L — ABNORMAL LOW (ref 135–145)
TOTAL PROTEIN: 5.3 g/dL — AB (ref 6.5–8.1)
Total Bilirubin: 1.5 mg/dL — ABNORMAL HIGH (ref 0.3–1.2)

## 2015-09-07 LAB — GRAM STAIN

## 2015-09-07 LAB — BODY FLUID CELL COUNT WITH DIFFERENTIAL
Eos, Fluid: NONE SEEN %
LYMPHS FL: 23 %
MONOCYTE-MACROPHAGE-SEROUS FLUID: 71 % (ref 50–90)
NEUTROPHIL FLUID: 6 % (ref 0–25)
WBC FLUID: 105 uL (ref 0–1000)

## 2015-09-07 LAB — AMMONIA: AMMONIA: 59 umol/L — AB (ref 9–35)

## 2015-09-07 LAB — HEMOGLOBIN A1C
HEMOGLOBIN A1C: 6.6 % — AB (ref 4.8–5.6)
MEAN PLASMA GLUCOSE: 143 mg/dL

## 2015-09-07 LAB — GLUCOSE, SEROUS FLUID: GLUCOSE FL: 220 mg/dL

## 2015-09-07 LAB — LACTATE DEHYDROGENASE, PLEURAL OR PERITONEAL FLUID: LD, Fluid: 29 U/L — ABNORMAL HIGH (ref 3–23)

## 2015-09-07 LAB — MRSA PCR SCREENING: MRSA BY PCR: NEGATIVE

## 2015-09-07 MED ORDER — LIDOCAINE HCL (PF) 1 % IJ SOLN
INTRAMUSCULAR | Status: AC
  Filled 2015-09-07: qty 10

## 2015-09-07 MED ORDER — LACTULOSE 10 GM/15ML PO SOLN
30.0000 g | Freq: Three times a day (TID) | ORAL | Status: DC
  Administered 2015-09-07 – 2015-09-10 (×10): 30 g via ORAL
  Filled 2015-09-07 (×10): qty 45

## 2015-09-07 MED ORDER — AMOXICILLIN 500 MG PO CAPS
500.0000 mg | ORAL_CAPSULE | Freq: Three times a day (TID) | ORAL | Status: DC
  Administered 2015-09-07 – 2015-09-10 (×10): 500 mg via ORAL
  Filled 2015-09-07 (×17): qty 1

## 2015-09-07 NOTE — Progress Notes (Signed)
[facility] TEAM 1 - Stepdown/ICU TEAM PROGRESS NOTE  [name] [id number] DOB: 04/03/1951 DOA: 09/06/2015 PCP: [name], MD  Admit HPI / Brief Narrative: 65yo M discharged from [facility] after a four-day stay on 08/31/2015 after being admitted for hepatic encephalopathy. After d/c the pt remained mentally clear until the night prior to this readmit when he began acting odd and very weak. Patient was compliant with all home medications including his lactulose 4 times daily. Of note however patient only had one bowel movement post discharge. Patient has routine paracentesis approximately every 3 days and gets 3 L of fluid off at each of those draws.   HPI/Subjective: The pt is c/o feeling "bad all over."  He is more alert, but his wife states he is still not back to his baseline mental status.  He denies cp, sob, n/v, or abdom pain.  Assessment/Plan:  Acute hepatic encephalopathy Ammonia 178 at admit - much improved w/ lactulose enemas - pt/wife swear to compliance w/ home meds - follow trend w/ home oral lactulose dose - ascites fluid studies not c/w SBP  Advance cirrhosis due to NASH MELD presently 21 (50-76% 3 month mortality) - has f/u appointment at [facility] 09/19/15  Thrombocytopenia Follow trend - avoid ASA or anticoag   Anemia of chronic disease  Hgb appears stable at this time - follow  Esophageal varices  No evidence of active bleeding at this time   CKD - hepatorenal syndrome  Most recent crt baseline appears to be ~2.4, but his is an increase from baseline as low as 1.5 in January of this year - follow   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 rate 9, height 5\' 10"  (1.778 m), weight 84.505 kg (186 lb 4.8 oz), SpO2 100 %.  Intake/Output Summary (Last 24 hours) at 09/07/15 1149 Last data filed at 09/07/15 0700  Gross per 24 hour  Intake 1887.5 ml  Output   1125 ml  Net  762.5 ml   Exam: General: No acute respiratory distress - alert but mildly confused  Lungs: Clear to auscultation bilaterally without wheezes or crackles Cardiovascular: Regular rate and rhythm without murmur gallop or rub Abdomen: Nontender, soft, bowel sounds positive, no rebound, no appreciable mass Extremities: No significant cyanosis, clubbing, or edema bilateral lower extremities  Data Reviewed:  Basic Metabolic Panel:  Recent Labs Lab 09/06/15 1418 09/06/15 1942 09/07/15 0545  NA 131*  --  132*  K 5.0  --  5.1  CL 108  --  105  CO2 16*  --  17*  GLUCOSE 312*  --  174*  BUN 57*  --  57*  CREATININE 2.39*  --  2.43*  CALCIUM 8.7*  --  9.3  MG  --  2.6*  --   PHOS  --  4.0  --     CBC:  Recent Labs  Lab 09/06/15 1325 09/07/15 0545  WBC 3.3* 2.6*  NEUTROABS 2.4  --   HGB 9.8* 9.5*  HCT 28.2* 27.1*  MCV 87.0 87.1  PLT 33* 21*    Liver Function Tests:  Recent Labs Lab 09/06/15 1418 09/07/15 0545  AST 33 31  ALT 27 27  ALKPHOS 104 101  BILITOT 1.2 1.5*  PROT 5.1* 5.3*  ALBUMIN 2.9* 3.0*    Recent Labs Lab 09/06/15 1418 09/07/15 0545  AMMONIA 178* 59*   Coags:  Recent Labs Lab 09/06/15 1320  INR 1.64*   CBG:  Recent Labs Lab 08/31/15 1156 09/06/15 2343 09/07/15 0807  GLUCAP 245* 230* 142*    Recent Results (from the past 240 hour(s))  Culture, blood (routine x 2)     Status: None  (Preliminary result)   Collection Time: 09/06/15  7:20 PM  Result Value Ref Range Status   Specimen Description BLOOD LEFT ARM  Final   Special Requests BOTTLES DRAWN AEROBIC AND ANAEROBIC 5CC  Final   Culture NO GROWTH < 24 HOURS  Final   Report Status PENDING  Incomplete  Culture, blood (routine x 2)     Status: None (Preliminary result)   Collection Time: 09/06/15  7:25 PM  Result Value Ref Range Status   Specimen Description BLOOD LEFT ARM  Final   Special Requests BOTTLES DRAWN AEROBIC AND ANAEROBIC 5CC  Final   Culture NO GROWTH < 24 HOURS  Final   Report Status PENDING  Incomplete  MRSA PCR Screening     Status: None   Collection Time: 09/06/15 11:36 PM  Result Value Ref Range Status   MRSA by PCR NEGATIVE NEGATIVE Final    Comment:        The GeneXpert MRSA Assay (FDA approved for NASAL specimens only), is one component of a comprehensive MRSA colonization surveillance program. It is not intended to diagnose MRSA infection nor to guide or monitor treatment for MRSA infections.      Studies:   Recent x-ray studies have been reviewed in detail by the Attending Physician  Scheduled Meds:  Scheduled Meds: . acidophilus  2 capsule Oral Daily  . baclofen  20 mg Oral QHS  . bethanechol  50 mg Oral BID  . ciprofloxacin  400 mg Intravenous Q12H  . feeding supplement (PRO-STAT SUGAR FREE 64)  30 mL Oral Daily  . FLUoxetine  10 mg Oral Daily  . gabapentin  100 mg Oral TID  . insulin aspart  0-5 Units Subcutaneous QHS  . insulin aspart  0-9 Units Subcutaneous TID WC  . lactulose  300 mL Rectal Q8H  . lidocaine (PF)      . metronidazole  500 mg Intravenous Q8H  . pantoprazole  80 mg Oral Daily  . rifaximin  550 mg Oral BID  . sodium chloride flush  3 mL Intravenous Q12H  . tamsulosin  0.4 mg Oral BID    Time spent on care of this patient: 35 mins   MCCLUNG,JEFFREY T , MD   [facility] Office  [phone] Pager - Text Page per Shea Evans as per  below:  On-Call/Text Page:      [email or web address]      password [email or web address]  If 7PM-7AM, please contact night-coverage [email or web address] Password [email or web address] 09/07/2015, 11:49 AM   LOS: 1 day

## 2015-09-07 NOTE — Care Management Note (Signed)
Case Management Note  Patient Details  Name: [name] MRN: [id number] Date of Birth: 1950-10-03  Subjective/Objective:      Adm w hepatic enceph             Action/Plan: cared for by wife, pcp dr Marcello Moores   Expected Discharge Date:                  Expected Discharge Plan:     In-House Referral:     Discharge planning Services     Post Acute Care Choice:    Choice offered to:     DME Arranged:    DME Agency:     HH Arranged:    [place] Agency:     Status of Service:     Medicare Important Message Given:    Date Medicare IM Given:    Medicare IM give by:    Date Additional Medicare IM Given:    Additional Medicare Important Message give by:     If discussed at [place] of Stay Meetings, dates discussed:    Additional Comments: ur review done  Lacretia Leigh, RN 09/07/2015, 9:20 AM

## 2015-09-07 NOTE — Procedures (Signed)
Ultrasound-guided diagnostic and therapeutic paracentesis performed yielding 3.5 liters of clear yellow colored fluid. No immediate complications.  [name] E 9:44 AM 09/07/2015

## 2015-09-08 LAB — COMPREHENSIVE METABOLIC PANEL
ALBUMIN: 3 g/dL — AB (ref 3.5–5.0)
ALK PHOS: 99 U/L (ref 38–126)
ALT: 26 U/L (ref 17–63)
ANION GAP: 6 (ref 5–15)
AST: 27 U/L (ref 15–41)
BILIRUBIN TOTAL: 1.8 mg/dL — AB (ref 0.3–1.2)
BUN: 60 mg/dL — AB (ref 6–20)
CALCIUM: 9.2 mg/dL (ref 8.9–10.3)
CO2: 17 mmol/L — ABNORMAL LOW (ref 22–32)
Chloride: 108 mmol/L (ref 101–111)
Creatinine, Ser: 2.43 mg/dL — ABNORMAL HIGH (ref 0.61–1.24)
GFR calc Af Amer: 31 mL/min — ABNORMAL LOW (ref >60)
GFR, EST NON AFRICAN AMERICAN: 27 mL/min — AB (ref >60)
GLUCOSE: 187 mg/dL — AB (ref 65–99)
Potassium: 5.2 mmol/L — ABNORMAL HIGH (ref 3.5–5.1)
Sodium: 131 mmol/L — ABNORMAL LOW (ref 135–145)
TOTAL PROTEIN: 5.5 g/dL — AB (ref 6.5–8.1)

## 2015-09-08 LAB — GLUCOSE, CAPILLARY
GLUCOSE-CAPILLARY: 176 mg/dL — AB (ref 65–99)
GLUCOSE-CAPILLARY: 279 mg/dL — AB (ref 65–99)
GLUCOSE-CAPILLARY: 223 mg/dL — AB (ref 65–99)
Glucose-Capillary: 191 mg/dL — ABNORMAL HIGH (ref 65–99)

## 2015-09-08 LAB — CBC
HCT: 27.9 % — ABNORMAL LOW (ref 39.0–52.0)
Hemoglobin: 9.9 g/dL — ABNORMAL LOW (ref 13.0–17.0)
MCH: 31 pg (ref 26.0–34.0)
MCHC: 35.5 g/dL (ref 30.0–36.0)
MCV: 87.5 fL (ref 78.0–100.0)
Platelets: 19 10*3/uL — CL (ref 150–400)
RBC: 3.19 MIL/uL — ABNORMAL LOW (ref 4.22–5.81)
RDW: 15.4 % (ref 11.5–15.5)
WBC: 3 10*3/uL — ABNORMAL LOW (ref 4.0–10.5)

## 2015-09-08 LAB — MISC LABCORP TEST (SEND OUT)
LABCORP TEST CODE: 315780
Labcorp test code: 88062

## 2015-09-08 LAB — PROTIME-INR
INR: 1.79 — ABNORMAL HIGH (ref 0.00–1.49)
Prothrombin Time: 20.8 seconds — ABNORMAL HIGH (ref 11.6–15.2)

## 2015-09-08 LAB — TRIGLYCERIDES, BODY FLUIDS: TRIGLYCERIDES FL: 15 mg/dL

## 2015-09-08 LAB — AMMONIA: Ammonia: 56 umol/L — ABNORMAL HIGH (ref 9–35)

## 2015-09-08 NOTE — Progress Notes (Signed)
[place] TEAM 1 - Stepdown/ICU TEAM PROGRESS NOTE  [name] [id number] DOB: [birth year] DOA: 09/06/2015 PCP: [name], MD  Admit HPI / Brief Narrative: 65yo M discharged from [facility] after a four-day stay on 08/31/2015 after being admitted for hepatic encephalopathy. After d/c the pt remained mentally clear until the night prior to this readmit when he began acting odd and very weak. Patient was compliant with all home medications including his lactulose 4 times daily. Of note however patient only had one bowel movement post discharge. Patient has routine paracentesis approximately every 3 days and gets 3 L of fluid off at each of those draws.   HPI/Subjective: The pt is alert and oriented.  He says he feels "about the same" but he looks much more alert.  He denies cp, sob, n'/v, or abdom pain.    Assessment/Plan:  Acute hepatic encephalopathy Ammonia 178 at admit - much improved w/ lactulose enemas - pt/wife swear to compliance w/ home meds - ammonia holding steady at ~60 w/ home oral lactulose dose - ascites fluid studies not c/w SBP - unclear why pt keeps getting encephalopathic at home - plan to watch as inpt at least another 48hrs to assure encephalopathy does not rapidly recur   Advance cirrhosis due to NASH MELD presently 21 (50-76% 3 month mortality) - has f/u appointment at [facility] 09/19/15 - due for repeat/scheduled paracentesis 09/10/15 at [facility] in [place]   Thrombocytopenia Follow trend - avoid ASA or anticoag   Anemia of chronic disease  Hgb appears stable at this time - follow  Esophageal varices  No evidence of active bleeding at this time   CKD - hepatorenal syndrome  Most recent crt baseline appears to be ~2.4 but his is an increase from baseline as low as 1.5 in January of this year - stable at this time   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 - on chronic suppressive antibiotics (amoxicillin 500mg  TID at least through April)  - have resumed usual tx regimen   DM2 CBG reasonably controlled - no hypoglycemia - no change in tx plan today   GERD  Severe Protein-calorie malnutrition  BPH  Depression   Code Status: NO CODE  Family Communication: spoke w/ wife at bedside  Disposition Plan: stable for transfer to medical bed - begin PT/OT/ambulation - follow ammonia and stool output w/ ongoing lactulose - possible d/c home after paracentesis Monday if stable for same after therapy evals   Consultants: none  Procedures: 3/3 - paracentesis - 3.5L   Antibiotics: Cipro 3/2 > 3/3 Flagyl 3/2 > 3/3 Amoxicillin (chronic) >   DVT prophylaxis: SCDs  Objective: Blood pressure 99/62, pulse 71, temperature 97.5 F (36.4 C), temperature source Oral, resp. rate 11, height 5\' 10"  (1.778 m), weight 84.505 kg (186 lb 4.8 oz), SpO2 99 %.  Intake/Output Summary (Last 24 hours) at 09/08/15 0906 Last data filed at 09/08/15 0700  Gross per 24 hour  Intake 581.33 ml  Output    725 ml  Net -143.67 ml   Exam: General: No acute respiratory distress - alert - less confused   Lungs: Clear to auscultation bilaterally  Cardiovascular: Regular rate and rhythm  Abdomen: Nontender, soft, bowel sounds positive, no rebound, no appreciable mass Extremities: No significant cyanosis, clubbing, edema bilateral lower extremities  Data Reviewed:  Basic Metabolic Panel:  Recent Labs Lab 09/06/15 1418 09/06/15 1942 09/07/15 0545 09/08/15 0356  NA 131*  --  132* 131*  K  5.0  --  5.1 5.2*  CL 108  --  105 108  CO2 16*  --  17* 17*  GLUCOSE 312*  --  174* 187*  BUN 57*  --  57* 60*  CREATININE 2.39*  --  2.43* 2.43*  CALCIUM 8.7*  --  9.3 9.2  MG  --  2.6*  --   --   PHOS  --  4.0  --   --     CBC:  Recent Labs Lab 09/06/15 1325 09/07/15 0545 09/08/15 0356  WBC 3.3* 2.6* 3.0*  NEUTROABS 2.4  --   --   HGB 9.8* 9.5* 9.9*  HCT 28.2* 27.1* 27.9*  MCV 87.0 87.1 87.5  PLT 33* 21* 19*    Liver Function  Tests:  Recent Labs Lab 09/06/15 1418 09/07/15 0545 09/08/15 0356  AST 33 31 27  ALT 27 27 26   ALKPHOS 104 101 99  BILITOT 1.2 1.5* 1.8*  PROT 5.1* 5.3* 5.5*  ALBUMIN 2.9* 3.0* 3.0*    Recent Labs Lab 09/06/15 1418 09/07/15 0545 09/08/15 0356  AMMONIA 178* 59* 56*   Coags:  Recent Labs Lab 09/06/15 1320 09/08/15 0356  INR 1.64* 1.79*   CBG:  Recent Labs Lab 09/07/15 0807 09/07/15 1150 09/07/15 1644 09/07/15 2314 09/08/15 0741  GLUCAP 142* 149* 202* 143* 176*    Recent Results (from the past 240 hour(s))  Culture, blood (routine x 2)     Status: None (Preliminary result)   Collection Time: 09/06/15  7:20 PM  Result Value Ref Range Status   Specimen Description BLOOD LEFT ARM  Final   Special Requests BOTTLES DRAWN AEROBIC AND ANAEROBIC 5CC  Final   Culture NO GROWTH < 24 HOURS  Final   Report Status PENDING  Incomplete  Culture, blood (routine x 2)     Status: None (Preliminary result)   Collection Time: 09/06/15  7:25 PM  Result Value Ref Range Status   Specimen Description BLOOD LEFT ARM  Final   Special Requests BOTTLES DRAWN AEROBIC AND ANAEROBIC 5CC  Final   Culture NO GROWTH < 24 HOURS  Final   Report Status PENDING  Incomplete  MRSA PCR Screening     Status: None   Collection Time: 09/06/15 11:36 PM  Result Value Ref Range Status   MRSA by PCR NEGATIVE NEGATIVE Final    Comment:        The GeneXpert MRSA Assay (FDA approved for NASAL specimens only), is one component of a comprehensive MRSA colonization surveillance program. It is not intended to diagnose MRSA infection nor to guide or monitor treatment for MRSA infections.   Gram stain     Status: None   Collection Time: 09/07/15  9:45 AM  Result Value Ref Range Status   Specimen Description FLUID PERITONEAL  Final   Special Requests NONE  Final   Gram Stain   Final    DEGENERATED CELLULAR MATERIAL PRESENT RBC PRESENT NO ORGANISMS SEEN    Report Status 09/07/2015 FINAL  Final      Studies:   Recent x-ray studies have been reviewed in detail by the Attending Physician  Scheduled Meds:  Scheduled Meds: . acidophilus  2 capsule Oral Daily  . amoxicillin  500 mg Oral TID  . baclofen  20 mg Oral QHS  . bethanechol  50 mg Oral BID  . feeding supplement (PRO-STAT SUGAR FREE 64)  30 mL Oral Daily  . FLUoxetine  10 mg Oral Daily  . gabapentin  100  mg Oral TID  . insulin aspart  0-5 Units Subcutaneous QHS  . insulin aspart  0-9 Units Subcutaneous TID WC  . lactulose  30 g Oral TID  . pantoprazole  80 mg Oral Daily  . rifaximin  550 mg Oral BID  . sodium chloride flush  3 mL Intravenous Q12H  . tamsulosin  0.4 mg Oral BID    Time spent on care of this patient: 25 mins   [facility] T , MD   [facility] Office  [phone] Pager - Text Page per Shea Evans as per below:  On-Call/Text Page:      [email or web address]      password [email or web address]  If 7PM-7AM, please contact night-coverage [email or web address] Password [email or web address] 09/08/2015, 9:06 AM   LOS: 2 days

## 2015-09-08 NOTE — Progress Notes (Signed)
NURSING PROGRESS NOTE  [name] [id number] Transfer Data: 09/08/2015 6:53 PM Attending Provider: Cherene Altes, MD [id number] Jonathan Moores, MD Code Status: DNR   [name] is a 65 y.o. male patient transferred from [facility]  -No acute distress noted.  -No complaints of shortness of breath.  -No complaints of chest pain.   Last Documented Vital Signs: Blood pressure 99/55, pulse 91, temperature 99.2 F (37.3 C), temperature source Oral, resp. rate 16, height 5\' 10"  (1.778 m), weight 83.915 kg (185 lb), SpO2 98 %.  IV Fluids:  IV in place, occlusive dsg intact without redness, IV cath antecubital right, condition patent and no redness none.   Allergies:  Review of patient's allergies indicates no known allergies.  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  Past Surgical History:   has past surgical history that includes Kidney stone surgery (yrs ago); Esophagogastroduodenoscopy (08/01/2011); Esophagogastroduodenoscopy (N/A, 10/27/2012); Esophagogastroduodenoscopy (N/A, 11/22/2012); Colonoscopy; Upper gastrointestinal endoscopy; Esophagogastroduodenoscopy (N/A, 11/29/2013); esophageal banding (Bilateral, 11/29/2013); Esophagogastroduodenoscopy (N/A, 06/13/2014); and esophageal banding (N/A, 06/13/2014).  Social History:   reports that he has never smoked. He has never used smokeless tobacco. He reports that he does not drink alcohol or use illicit drugs.  Skin: Intact except for Stage  2 on sacrum, red, blanchable, intact  Patient/Family orientated to room. Information packet given to patient/family. Admission inpatient armband information verified with patient/family to include name and date of birth and placed on patient arm. Side rails up x 2, fall assessment and education completed with patient/family. Patient/family able to verbalize understanding of risk associated with falls and verbalized understanding to call for assistance before getting out of bed. Call light within reach. Patient/family able to voice and demonstrate understanding of unit orientation instructions.

## 2015-09-09 ENCOUNTER — Inpatient Hospital Stay (HOSPITAL_COMMUNITY): Payer: Managed Care, Other (non HMO)

## 2015-09-09 DIAGNOSIS — K7200 Acute and subacute hepatic failure without coma: Principal | ICD-10-CM

## 2015-09-09 DIAGNOSIS — L8990 Pressure ulcer of unspecified site, unspecified stage: Secondary | ICD-10-CM | POA: Insufficient documentation

## 2015-09-09 DIAGNOSIS — E1121 Type 2 diabetes mellitus with diabetic nephropathy: Secondary | ICD-10-CM

## 2015-09-09 DIAGNOSIS — K7290 Hepatic failure, unspecified without coma: Secondary | ICD-10-CM

## 2015-09-09 DIAGNOSIS — R188 Other ascites: Secondary | ICD-10-CM

## 2015-09-09 DIAGNOSIS — F329 Major depressive disorder, single episode, unspecified: Secondary | ICD-10-CM

## 2015-09-09 LAB — PROTEIN, BODY FLUID

## 2015-09-09 LAB — GLUCOSE, CAPILLARY
GLUCOSE-CAPILLARY: 260 mg/dL — AB (ref 65–99)
Glucose-Capillary: 184 mg/dL — ABNORMAL HIGH (ref 65–99)
Glucose-Capillary: 321 mg/dL — ABNORMAL HIGH (ref 65–99)
Glucose-Capillary: 242 mg/dL — ABNORMAL HIGH (ref 65–99)

## 2015-09-09 LAB — COMPREHENSIVE METABOLIC PANEL
ALK PHOS: 90 U/L (ref 38–126)
ALT: 23 U/L (ref 17–63)
ANION GAP: 9 (ref 5–15)
AST: 26 U/L (ref 15–41)
Albumin: 2.9 g/dL — ABNORMAL LOW (ref 3.5–5.0)
BUN: 54 mg/dL — ABNORMAL HIGH (ref 6–20)
CALCIUM: 9.4 mg/dL (ref 8.9–10.3)
CHLORIDE: 106 mmol/L (ref 101–111)
CO2: 19 mmol/L — AB (ref 22–32)
Creatinine, Ser: 2.3 mg/dL — ABNORMAL HIGH (ref 0.61–1.24)
GFR calc non Af Amer: 29 mL/min — ABNORMAL LOW (ref >60)
GFR, EST AFRICAN AMERICAN: 33 mL/min — AB (ref >60)
Glucose, Bld: 208 mg/dL — ABNORMAL HIGH (ref 65–99)
Potassium: 4.7 mmol/L (ref 3.5–5.1)
SODIUM: 134 mmol/L — AB (ref 135–145)
Total Bilirubin: 1.4 mg/dL — ABNORMAL HIGH (ref 0.3–1.2)
Total Protein: 5.4 g/dL — ABNORMAL LOW (ref 6.5–8.1)

## 2015-09-09 LAB — GRAM STAIN

## 2015-09-09 LAB — BODY FLUID CELL COUNT WITH DIFFERENTIAL
LYMPHS FL: 17 %
Monocyte-Macrophage-Serous Fluid: 75 % (ref 50–90)
Neutrophil Count, Fluid: 8 % (ref 0–25)
WBC FLUID: 81 uL (ref 0–1000)

## 2015-09-09 LAB — AMMONIA: Ammonia: 56 umol/L — ABNORMAL HIGH (ref 9–35)

## 2015-09-09 MED ORDER — LIDOCAINE HCL (PF) 1 % IJ SOLN
INTRAMUSCULAR | Status: AC
  Filled 2015-09-09: qty 10

## 2015-09-09 MED ORDER — ALBUMIN HUMAN 25 % IV SOLN
50.0000 g | Freq: Once | INTRAVENOUS | Status: AC
  Administered 2015-09-10: 50 g via INTRAVENOUS
  Filled 2015-09-09: qty 50

## 2015-09-09 NOTE — Progress Notes (Signed)
[place] TEAM 1 - Stepdown/ICU TEAM PROGRESS NOTE  YARIN BINGER [id number] DOB: Jan 12, 1951 DOA: 09/06/2015 PCP: [name], MD  Admit HPI / Brief Narrative: 65yo M discharged from [facility] after a four-day stay on 08/31/2015 after being admitted for hepatic encephalopathy. After d/c the pt remained mentally clear until the night prior to this readmit when he began acting odd and very weak. Patient was compliant with all home medications including his lactulose 4 times daily. Of note however patient only had one bowel movement post discharge. Patient has routine paracentesis approximately every 3 days and gets 3 L of fluid off at each of those draws.   HPI/Subjective:  wife is by the bedside, patient appears comfortable,  abdomen getting more distended  Assessment/Plan:  Acute hepatic encephalopathy Ammonia 178 at admit - much improved w/ lactulose enemas - pt/wife swear to compliance w/ home meds - ammonia holding steady at ~60 w/ home oral lactulose dose - ascites fluid studies not c/w SBP - unclear why pt keeps getting encephalopathic at home - anticipate discharge tomorrow after paracentesis ,   Advance cirrhosis due to NASH MELD presently 21 (50-76% 3 month mortality) - has f/u appointment at [facility] 09/19/15  To be considered for transplant - due for repeat/scheduled paracentesis 09/10/15 at [facility] in [place]  , will do this inpatient  Albumin prior to paracentesis  Not a candidate for Pleurx  Catheter  , as the patient is being considered for transplant  Thrombocytopenia Follow trend - avoid ASA or anticoag   Anemia of chronic disease  Hgb appears stable at this time - follow  Esophageal varices  No evidence of active bleeding at this time   CKD - hepatorenal syndrome  Most recent crt baseline appears to be ~2.4 but his is an increase from baseline as low as 1.5 in January of this year - stable at this time   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 - on chronic suppressive antibiotics (amoxicillin 500mg  TID at least through April) - have resumed usual tx regimen   DM2 CBG reasonably controlled - no hypoglycemia - no change in tx plan today   GERD  Severe Protein-calorie malnutrition  BPH  Depression   Code Status: NO CODE  Family Communication: spoke w/ wife at bedside    Disposition Plan: anticipate discharge tomorrow after paracentesis   Consultants: none  Procedures: 3/3 - paracentesis - 3.5L   Antibiotics: Cipro 3/2 > 3/3 Flagyl 3/2 > 3/3 Amoxicillin (chronic) >   DVT prophylaxis: SCDs  Objective: Blood pressure 97/67, pulse 67, temperature 97.6 F (36.4 C), temperature source Oral, resp. rate 18, height 5\' 10"  (1.778 m), weight 83.915 kg (185 lb), SpO2 99 %.  Intake/Output Summary (Last 24 hours) at 09/09/15 1046 Last data filed at 09/09/15 0933  Gross per 24 hour  Intake   1540 ml  Output    500 ml  Net   1040 ml   Exam: General: No acute respiratory distress - alert - less confused   Lungs: Clear to auscultation bilaterally  Cardiovascular: Regular rate and rhythm  Abdomen: Nontender, soft, bowel sounds positive, no rebound, no appreciable mass Extremities: No significant cyanosis, clubbing, edema bilateral lower extremities  Data Reviewed:  Basic Metabolic Panel:  Recent Labs Lab 09/06/15 1418 09/06/15 1942 09/07/15 0545 09/08/15 0356 09/09/15 0600  NA 131*  --  132* 131* 134*  K 5.0  --  5.1 5.2* 4.7  CL 108  --  105 108 106  CO2 16*  --  17* 17* 19*  GLUCOSE 312*  --  174* 187* 208*  BUN 57*  --  57* 60* 54*  CREATININE 2.39*  --  2.43* 2.43* 2.30*  CALCIUM 8.7*  --  9.3 9.2 9.4  MG  --  2.6*  --   --   --   PHOS  --  4.0  --   --   --     CBC:  Recent Labs Lab 09/06/15 1325 09/07/15 0545 09/08/15 0356 09/09/15 0600  WBC 3.3* 2.6* 3.0* 2.7*  NEUTROABS 2.4  --   --   --   HGB 9.8* 9.5* 9.9* 10.0*  HCT 28.2* 27.1* 27.9* 29.2*  MCV 87.0 87.1 87.5 86.9  PLT 33*  21* 19* 18*    Liver Function Tests:  Recent Labs Lab 09/06/15 1418 09/07/15 0545 09/08/15 0356 09/09/15 0600  AST 33 31 27 26   ALT 27 27 26 23   ALKPHOS 104 101 99 90  BILITOT 1.2 1.5* 1.8* 1.4*  PROT 5.1* 5.3* 5.5* 5.4*  ALBUMIN 2.9* 3.0* 3.0* 2.9*    Recent Labs Lab 09/06/15 1418 09/07/15 0545 09/08/15 0356 09/09/15 0600  AMMONIA 178* 59* 56* 56*   Coags:  Recent Labs Lab 09/06/15 1320 09/08/15 0356  INR 1.64* 1.79*   CBG:  Recent Labs Lab 09/08/15 0741 09/08/15 1145 09/08/15 1647 09/08/15 2125 09/09/15 0753  GLUCAP 176* 279* 223* 191* 184*    Recent Results (from the past 240 hour(s))  Culture, blood (routine x 2)     Status: None (Preliminary result)   Collection Time: 09/06/15  7:20 PM  Result Value Ref Range Status   Specimen Description BLOOD LEFT ARM  Final   Special Requests BOTTLES DRAWN AEROBIC AND ANAEROBIC 5CC  Final   Culture NO GROWTH 2 DAYS  Final   Report Status PENDING  Incomplete  Culture, blood (routine x 2)     Status: None (Preliminary result)   Collection Time: 09/06/15  7:25 PM  Result Value Ref Range Status   Specimen Description BLOOD LEFT ARM  Final   Special Requests BOTTLES DRAWN AEROBIC AND ANAEROBIC 5CC  Final   Culture NO GROWTH 2 DAYS  Final   Report Status PENDING  Incomplete  MRSA PCR Screening     Status: None   Collection Time: 09/06/15 11:36 PM  Result Value Ref Range Status   MRSA by PCR NEGATIVE NEGATIVE Final    Comment:        The GeneXpert MRSA Assay (FDA approved for NASAL specimens only), is one component of a comprehensive MRSA colonization surveillance program. It is not intended to diagnose MRSA infection nor to guide or monitor treatment for MRSA infections.   Culture, body fluid-bottle     Status: None (Preliminary result)   Collection Time: 09/07/15  9:45 AM  Result Value Ref Range Status   Specimen Description FLUID PERITONEAL  Final   Special Requests NONE  Final   Culture NO GROWTH  1 DAY  Final   Report Status PENDING  Incomplete  Gram stain     Status: None   Collection Time: 09/07/15  9:45 AM  Result Value Ref Range Status   Specimen Description FLUID PERITONEAL  Final   Special Requests NONE  Final   Gram Stain   Final    DEGENERATED CELLULAR MATERIAL PRESENT RBC PRESENT NO ORGANISMS SEEN    Report Status 09/07/2015 FINAL  Final     Studies:  Recent x-ray studies have been reviewed in detail by the Attending Physician  Scheduled Meds:  Scheduled Meds: . acidophilus  2 capsule Oral Daily  . amoxicillin  500 mg Oral TID  . baclofen  20 mg Oral QHS  . bethanechol  50 mg Oral BID  . feeding supplement (PRO-STAT SUGAR FREE 64)  30 mL Oral Daily  . FLUoxetine  10 mg Oral Daily  . gabapentin  100 mg Oral TID  . insulin aspart  0-5 Units Subcutaneous QHS  . insulin aspart  0-9 Units Subcutaneous TID WC  . lactulose  30 g Oral TID  . pantoprazole  80 mg Oral Daily  . rifaximin  550 mg Oral BID  . sodium chloride flush  3 mL Intravenous Q12H  . tamsulosin  0.4 mg Oral BID    Time spent on care of this patient: 25 mins   Reyne Dumas , MD   [facility] Office  [phone] Pager - Text Page per Shea Evans as per below:  On-Call/Text Page:      [email or web address]      password [email or web address]  If 7PM-7AM, please contact night-coverage [email or web address] Password [email or web address] 09/09/2015, 10:46 AM   LOS: 3 days

## 2015-09-09 NOTE — Procedures (Signed)
Successful US guided paracentesis from RLQ.  Yielded 5L of clear yellow fluid.  No immediate complications.  Pt tolerated well.   Specimen was sent for labs.  Ascencion Dike PA-C 09/09/2015 11:36 AM

## 2015-09-09 NOTE — Progress Notes (Signed)
PT Cancellation Note  Patient Details Name: [name] MRN: [id number] DOB: 11/25/1950   Cancelled Treatment:    Reason Eval/Treat Not Completed: Patient at procedure or test/unavailable   Lanetta Inch Beth 09/09/2015, 11:36 AM Elwyn Reach, [place]

## 2015-09-10 ENCOUNTER — Other Ambulatory Visit: Payer: Self-pay

## 2015-09-10 ENCOUNTER — Telehealth: Payer: Self-pay | Admitting: Internal Medicine

## 2015-09-10 ENCOUNTER — Ambulatory Visit (HOSPITAL_COMMUNITY): Payer: Managed Care, Other (non HMO)

## 2015-09-10 DIAGNOSIS — R188 Other ascites: Secondary | ICD-10-CM

## 2015-09-10 LAB — COMPREHENSIVE METABOLIC PANEL
ALK PHOS: 104 U/L (ref 38–126)
ALT: 22 U/L (ref 17–63)
ANION GAP: 10 (ref 5–15)
AST: 25 U/L (ref 15–41)
Albumin: 3 g/dL — ABNORMAL LOW (ref 3.5–5.0)
BUN: 52 mg/dL — ABNORMAL HIGH (ref 6–20)
CALCIUM: 9.1 mg/dL (ref 8.9–10.3)
CO2: 18 mmol/L — AB (ref 22–32)
Chloride: 106 mmol/L (ref 101–111)
Creatinine, Ser: 2.34 mg/dL — ABNORMAL HIGH (ref 0.61–1.24)
GFR calc non Af Amer: 28 mL/min — ABNORMAL LOW (ref >60)
GFR, EST AFRICAN AMERICAN: 32 mL/min — AB (ref >60)
Glucose, Bld: 304 mg/dL — ABNORMAL HIGH (ref 65–99)
POTASSIUM: 4.3 mmol/L (ref 3.5–5.1)
SODIUM: 134 mmol/L — AB (ref 135–145)
Total Bilirubin: 1.1 mg/dL (ref 0.3–1.2)
Total Protein: 5.4 g/dL — ABNORMAL LOW (ref 6.5–8.1)

## 2015-09-10 LAB — CBC
HEMATOCRIT: 29.2 % — AB (ref 39.0–52.0)
HEMATOCRIT: 28.7 % — AB (ref 39.0–52.0)
HEMOGLOBIN: 10.1 g/dL — AB (ref 13.0–17.0)
Hemoglobin: 10 g/dL — ABNORMAL LOW (ref 13.0–17.0)
MCH: 29.8 pg (ref 26.0–34.0)
MCH: 30.7 pg (ref 26.0–34.0)
MCHC: 34.2 g/dL (ref 30.0–36.0)
MCHC: 35.2 g/dL (ref 30.0–36.0)
MCV: 86.9 fL (ref 78.0–100.0)
MCV: 87.2 fL (ref 78.0–100.0)
PLATELETS: 18 10*3/uL — AB (ref 150–400)
Platelets: 20 10*3/uL — CL (ref 150–400)
RBC: 3.36 MIL/uL — ABNORMAL LOW (ref 4.22–5.81)
RBC: 3.29 MIL/uL — AB (ref 4.22–5.81)
RDW: 15.4 % (ref 11.5–15.5)
RDW: 15.6 % — ABNORMAL HIGH (ref 11.5–15.5)
WBC: 2.7 10*3/uL — AB (ref 4.0–10.5)
WBC: 3.1 10*3/uL — ABNORMAL LOW (ref 4.0–10.5)

## 2015-09-10 LAB — GLUCOSE, CAPILLARY
GLUCOSE-CAPILLARY: 242 mg/dL — AB (ref 65–99)
GLUCOSE-CAPILLARY: 282 mg/dL — AB (ref 65–99)
GLUCOSE-CAPILLARY: 318 mg/dL — AB (ref 65–99)

## 2015-09-10 LAB — AMMONIA: AMMONIA: 74 umol/L — AB (ref 9–35)

## 2015-09-10 MED ORDER — SODIUM CHLORIDE 0.9 % IV BOLUS (SEPSIS)
250.0000 mL | Freq: Once | INTRAVENOUS | Status: AC
  Administered 2015-09-10: 250 mL via INTRAVENOUS

## 2015-09-10 MED ORDER — SODIUM CHLORIDE 0.9 % IV BOLUS (SEPSIS): 250.0000 mL | Freq: Once | INTRAVENOUS | Status: DC

## 2015-09-10 MED ORDER — RISAQUAD PO CAPS
2.0000 | ORAL_CAPSULE | Freq: Every day | ORAL | Status: DC
Start: 2015-09-10 — End: 2016-06-11

## 2015-09-10 MED ORDER — LACTULOSE 10 GM/15ML PO SOLN: 30.0000 g | Freq: Three times a day (TID) | ORAL | Status: DC

## 2015-09-10 NOTE — Discharge Summary (Signed)
Physician Discharge Summary  [name] MRN: [id number] DOB/AGE: [birth year] 65 y.o.  PCP: [name], MD   Admit date: 09/06/2015 Discharge date: 09/10/2015  Discharge Diagnoses:     Principal Problem:   Acute hepatic encephalopathy ([place]) Active Problems:   Ascites   Depression   Controlled type 2 diabetes mellitus with diabetic nephropathy, without long-term current use of insulin (HCC)   NASH (nonalcoholic steatohepatitis)   Hepatic encephalopathy (HCC)   Pressure ulcer    Follow-up recommendations Follow-up with PCP in 3-5 days , including all  additional recommended appointments as below Follow-up CBC, CMP, ammonia in 3-5 days Patient being discharged with home health     Medication List    TAKE these medications        acidophilus Caps capsule  Take 2 capsules by mouth daily.     amoxicillin 500 MG capsule  Commonly known as:  AMOXIL  Take 1 capsule (500 mg total) by mouth 3 (three) times daily.     baclofen 20 MG tablet  Commonly known as:  LIORESAL  Take 1 tablet (20 mg total) by mouth at bedtime.     bethanechol 50 MG tablet  Commonly known as:  URECHOLINE  Take 1 tablet by mouth 2 (two) times daily.     feeding supplement (PRO-STAT SUGAR FREE 64) Liqd  Take 30 mLs by mouth 3 (three) times daily.     FLUoxetine 10 MG tablet  Commonly known as:  PROZAC  Take 10 mg by mouth daily.     fluticasone 50 MCG/ACT nasal spray  Commonly known as:  FLONASE  Place 1 spray into both nostrils daily.     gabapentin 100 MG capsule  Commonly known as:  NEURONTIN  Take 100 mg by mouth 3 (three) times daily.     Insulin Degludec 200 UNIT/ML Sopn  Commonly known as:  TRESIBA FLEXTOUCH  Inject 20 Units into the skin daily.     lactulose 10 GM/15ML solution  Commonly known as:  CHRONULAC  Take 45 mLs (30 g total) by mouth 3 (three) times daily.     multivitamin with minerals Tabs tablet  Take 1 tablet by mouth daily.     pantoprazole 40 MG tablet   Commonly known as:  PROTONIX  TAKE 1 TABLET (40 MG TOTAL) BY MOUTH DAILY.     tamsulosin 0.4 MG Caps capsule  Commonly known as:  FLOMAX  Take 1 capsule by mouth 2 (two) times daily.     Vitamin D (Ergocalciferol) 50000 units Caps capsule  Commonly known as:  DRISDOL  Take 50,000 Units by mouth every 7 (seven) days. Mondays.     XIFAXAN 550 MG Tabs tablet  Generic drug:  rifaximin  TAKE 1 TABLET (550 MG TOTAL) BY MOUTH 2 (TWO) TIMES DAILY.         Discharge Condition: Prognosis poor   Discharge Instructions       Discharge Instructions    Diet - low sodium heart healthy    Complete by:  As directed      Diet - low sodium heart healthy    Complete by:  As directed      Increase activity slowly    Complete by:  As directed      Increase activity slowly    Complete by:  As directed            No Known Allergies    Disposition: 01-Home or Self Care   Consults:    Significant  Diagnostic Studies:  Dg Chest 2 View  08/27/2015  CLINICAL DATA:  Cirrhosis, altered mental status EXAM: CHEST  2 VIEW COMPARISON:  08/22/2015 FINDINGS: Cardiomediastinal silhouette is stable. There is elevation of the right hemidiaphragm. No infiltrate or pulmonary edema. Mild basilar atelectasis. IMPRESSION: Cardiomegaly. Elevation of the right hemidiaphragm. Mild basilar atelectasis. No pulmonary edema. Electronically Signed   By: Lahoma Crocker M.D.   On: 08/27/2015 16:19   Dg Chest 2 View  08/22/2015  CLINICAL DATA:  Cough/chills/left anterior cp x  28mo Hx of DM. EXAM: CHEST  2 VIEW COMPARISON:  Multiple priors, including 08/04/2015 FINDINGS: Lateral view degraded by patient arm position. Midline trachea. Mild cardiomegaly. No pleural effusion or pneumothorax. Reverse apical lordotic positioning. Prominent first anterior ribs, worse on the right. Similar. Clear lungs. IMPRESSION: No acute cardiopulmonary disease. Mild cardiomegaly, without congestive failure. Electronically Signed   By:  KAbigail MiyamotoM.D.   On: 08/22/2015 16:06   [place]dom[place]  08/27/2015  CLINICAL DATA:  Evaluate ascites for possible paracentesis EXAM: LIMITED ABDOMEN ULTRASOUND FOR ASCITES TECHNIQUE: Limited ultrasound survey for ascites was performed in all four abdominal quadrants. COMPARISON:  Abdominal ultrasound of August 23, 2015 FINDINGS: The patient was noted be quite lethargic and confused and precluded paracentesis. A moderate volume of fluid is seen in all quadrants of the abdomen. IMPRESSION: There is a moderate volume of ascites present. Ascites could not be performed due to the patient's clinical condition as perched KSaverio Danker PAC. Electronically Signed   By: David  JMartiniqueM.D.   On: 08/27/2015 15:35   [place]acentesis  09/09/2015  INDICATION: NASH cirrhosis. Abdominal distention secondary to recurrent ascites. Request diagnostic and therapeutic paracentesis of up to 5 L max. EXAM: ULTRASOUND GUIDED RIGHT LOWER QUADRANT PARACENTESIS MEDICATIONS: None. COMPLICATIONS: None immediate. PROCEDURE: Informed written consent was obtained from the patient after a discussion of the risks, benefits and alternatives to treatment. A timeout was performed prior to the initiation of the procedure. Initial ultrasound scanning demonstrates a large amount of ascites within the right lower abdominal quadrant. The right lower abdomen was prepped and draped in the usual sterile fashion. 1% lidocaine with epinephrine was used for local anesthesia. Following this, a 7 cm, 19 gauge, Yueh catheter was introduced. An ultrasound image was saved for documentation purposes. The paracentesis was performed. The catheter was removed and a dressing was applied. The patient tolerated the procedure well without immediate post procedural complication. FINDINGS: A total of approximately 5 L of clear yellow fluid was removed. Samples were sent to the laboratory as requested by the clinical team. IMPRESSION: Successful ultrasound-guided  paracentesis yielding 5 liters of peritoneal fluid. Read by: KAscencion DikePA-C Electronically Signed   By: DLucrezia EuropeM.D.   On: 09/09/2015 11:45   [place]acentesis  09/07/2015  INDICATION: Recurrent ascites secondary to NASH. Request made for diagnostic and therapeutic paracentesis. EXAM: ULTRASOUND GUIDED DIAGNOSTIC AND THERAPEUTIC PARACENTESIS MEDICATIONS: 1% lidocaine COMPLICATIONS: None immediate. PROCEDURE: Informed written consent was obtained from the patient after a discussion of the risks, benefits and alternatives to treatment. A timeout was performed prior to the initiation of the procedure. Initial ultrasound scanning demonstrates a large amount of ascites within the left lower abdominal quadrant. The left lower abdomen was prepped and draped in the usual sterile fashion. 1% lidocaine was used for local anesthesia. Following this, a Safe-T-Centesis catheter was introduced. The paracentesis was performed. The catheter was removed and a dressing was applied. The patient tolerated the procedure well without  immediate post procedural complication. FINDINGS: A total of approximately 3.5 L of clear yellow fluid was removed. Samples were sent to the laboratory as requested by the clinical team. IMPRESSION: Successful ultrasound-guided paracentesis yielding 3.5 liters of peritoneal fluid. Read by: [name], PA-C Electronically Signed   By: Lucrezia Europe M.D.   On: 09/07/2015 10:12   [place] Paracentesis  09/03/2015  INDICATION: Ascites secondary to cirrhosis of the liver. Request made for diagnostic and therapeutic paracentesis but the 3 L maximum. EXAM: ULTRASOUND GUIDED DIAGNOSTIC AND THERAPEUTIC PARACENTESIS MEDICATIONS: 1% lidocaine COMPLICATIONS: None immediate. PROCEDURE: Informed written consent was obtained from the patient after a discussion of the risks, benefits and alternatives to treatment. A timeout was performed prior to the initiation of the procedure. Initial ultrasound scanning demonstrates  a large amount of ascites within the left lower abdominal quadrant. The left lower abdomen was prepped and draped in the usual sterile fashion. 1% lidocaine was used for local anesthesia. Following this, a 6 Fr Safe-T-Centesis catheter was introduced. The paracentesis was performed. The catheter was removed and a dressing was applied. The patient tolerated the procedure well without immediate post procedural complication. FINDINGS: A total of approximately 3 L of clear yellow fluid was removed. Samples were sent to the laboratory as requested by the clinical team. IMPRESSION: Successful ultrasound-guided paracentesis yielding 3 liters of peritoneal fluid. The patient received 25 g of albumin and short stay. Read by: [name], PA-C Electronically Signed   By: Jerilynn Mages.  Shick M.D.   On: 09/03/2015 15:08   [place] Paracentesis  08/30/2015  INDICATION: Abdominal distention secondary recurrent ascites. Request therapeutic paracentesis of up to 3 L max. EXAM: ULTRASOUND GUIDED RIGHT LOWER QUADRANT PARACENTESIS MEDICATIONS: None. COMPLICATIONS: None immediate. PROCEDURE: Informed written consent was obtained from the patient after a discussion of the risks, benefits and alternatives to treatment. A timeout was performed prior to the initiation of the procedure. Initial ultrasound scanning demonstrates a large amount of ascites within the right lower abdominal quadrant. The right lower abdomen was prepped and draped in the usual sterile fashion. 1% lidocaine with epinephrine was used for local anesthesia. Following this, a Safe-T-Centesis catheter was introduced. An ultrasound image was saved for documentation purposes. The paracentesis was performed. The catheter was removed and a dressing was applied. The patient tolerated the procedure well without immediate post procedural complication. FINDINGS: A total of approximately 3 L of clear yellow fluid was removed. IMPRESSION: Successful ultrasound-guided paracentesis yielding 3  liters of peritoneal fluid. Read by: Ascencion Dike PA-C Electronically Signed   By: Lucrezia Europe M.D.   On: 08/30/2015 14:59   [place] Paracentesis  08/28/2015  INDICATION: 65 year old male with a history of ascites. Multiple prior paracentesis. Request for no more than 3.0 L aspiration. EXAM: ULTRASOUND GUIDED  PARACENTESIS MEDICATIONS: None. COMPLICATIONS: None PROCEDURE: Informed written consent was obtained from the patient after a discussion of the risks, benefits and alternatives to treatment. A timeout was performed prior to the initiation of the procedure. Initial ultrasound scanning demonstrates a large amount of ascites within the right lower abdominal quadrant. The right lower abdomen was prepped and draped in the usual sterile fashion. 1% lidocaine with epinephrine was used for local anesthesia. Following this, a yueh catheter was introduced. An ultrasound image was saved for documentation purposes. The paracentesis was performed. The catheter was removed and a dressing was applied. The patient tolerated the procedure well without immediate post procedural complication. FINDINGS: A total of approximately 3.0 L of thin yellow fluid was  removed. Samples were sent to the laboratory as requested by the clinical team. IMPRESSION: Status post ultrasound-guided paracentesis with 3.0 L fluid removed. Sample sent to the lab for analysis. Signed, Dulcy Fanny. Earleen Newport, DO [facility] Electronically Signed   By: Corrie Mckusick D.O.   On: 08/28/2015 10:25   [place] Paracentesis  08/23/2015  INDICATION: NASH, chronic renal insufficiency. Progressive hepatic failure with encephalopathy. Request for diagnostic and therapeutic paracentesis of up to 3 L max. EXAM: ULTRASOUND GUIDED LEFT LOWER QUADRANT PARACENTESIS MEDICATIONS: None. COMPLICATIONS: None immediate. PROCEDURE: Informed written consent was obtained from the patient after a discussion of the risks, benefits and  alternatives to treatment. A timeout was performed prior to the initiation of the procedure. Initial ultrasound scanning demonstrates a large amount of ascites within the right lower abdominal quadrant. The right lower abdomen was prepped and draped in the usual sterile fashion. 1% lidocaine with epinephrine was used for local anesthesia. Following this, a 19 gauge, 7 cm Yueh catheter was introduced. An ultrasound image was saved for documentation purposes. The paracentesis was performed. The catheter was removed and a dressing was applied. The patient tolerated the procedure well without immediate post procedural complication. FINDINGS: A total of approximately 3 L of hazy yellow fluid was removed. Samples were sent to the laboratory as requested by the clinical team. IMPRESSION: Successful ultrasound-guided paracentesis yielding 3 liters of peritoneal fluid. Read by: Ascencion Dike PA-C Electronically Signed   By: Markus Daft M.D.   On: 08/23/2015 14:51   [place] Paracentesis  08/20/2015  INDICATION: Cirrhosis, renal failure, recurrent ascites. Request is made for diagnostic and therapeutic paracentesis up to 3 L max. EXAM: ULTRASOUND GUIDED THERAPEUTIC AND DIAGNOSTIC PARACENTESIS MEDICATIONS: 1% lidocaine COMPLICATIONS: None immediate. PROCEDURE: Informed written consent was obtained from the patient after a discussion of the risks, benefits and alternatives to treatment. A timeout was performed prior to the initiation of the procedure. Initial ultrasound scanning demonstrates a large amount of ascites within the left lower abdominal quadrant. The left lower abdomen was prepped and draped in the usual sterile fashion. 1% lidocaine was used for local anesthesia. Following this, a Safe-T-Centesis catheter was introduced. An ultrasound image was saved for documentation purposes. The paracentesis was performed. The catheter was removed and a dressing was applied. The patient tolerated the procedure well without immediate  post procedural complication. FINDINGS: A total of approximately 3 L of clear yellow fluid was removed. Samples were sent to the laboratory as requested by the clinical team. IMPRESSION: Successful ultrasound-guided paracentesis yielding 3 liters of peritoneal fluid. Read by: [name], PA-C Electronically Signed   By: Markus Daft M.D.   On: 08/20/2015 15:04   US Paracentesis  08/17/2015  INDICATION: Cirrhosis, renal failure, recurrent ascites. Request is made for therapeutic paracentesis up to 3 liters. EXAM: ULTRASOUND GUIDED THERAPEUTIC PARACENTESIS MEDICATIONS: None. COMPLICATIONS: None immediate. PROCEDURE: Informed written consent was obtained from the patient after a discussion of the risks, benefits and alternatives to treatment. A timeout was performed prior to the initiation of the procedure. Initial ultrasound scanning demonstrates a large amount of ascites within the left lower abdominal quadrant. The left lower abdomen was prepped and draped in the usual sterile fashion. 1% lidocaine was used for local anesthesia. Following this, a Yueh catheter was introduced. An ultrasound image was saved for documentation purposes. The paracentesis was performed. The catheter was removed and a dressing was applied. The patient tolerated the procedure well without immediate post procedural complication.  FINDINGS: A total of approximately 3 liters of yellow fluid was removed. IMPRESSION: Successful ultrasound-guided therapeutic paracentesis yielding 3 liters of peritoneal fluid. The patient will receive IV albumin infusion postprocedure. Read by: Rowe Robert, PA-C Electronically Signed   By: Sandi Mariscal M.D.   On: 08/17/2015 10:34   US Paracentesis  08/13/2015  INDICATION: Cirrhosis, recurrent ascites. Request is made for a diagnostic and therapeutic paracentesis with a max of 3 L. EXAM: ULTRASOUND GUIDED right lower quadrant PARACENTESIS MEDICATIONS: 1% lidocaine COMPLICATIONS: None immediate. PROCEDURE:  Informed written consent was obtained from the patient after a discussion of the risks, benefits and alternatives to treatment. A timeout was performed prior to the initiation of the procedure. Initial ultrasound scanning demonstrates a large amount of ascites within the right lower abdominal quadrant. The right lower abdomen was prepped and draped in the usual sterile fashion. 1% lidocaine was used for local anesthesia. Following this, a Safe-T-Centesis catheter was introduced. An ultrasound image was saved for documentation purposes. The paracentesis was performed. The catheter was removed and a dressing was applied. The patient tolerated the procedure well without immediate post procedural complication. FINDINGS: A total of approximately 3 L of clear yellow fluid was removed. Samples were sent to the laboratory as requested by the clinical team. IMPRESSION: Successful ultrasound-guided paracentesis yielding 3 liters of peritoneal fluid. Post procedure the patient received 25 g of albumin per [name] Media request. Read by: [name], PA-C Electronically Signed   By: Corrie Mckusick D.O.   On: 08/13/2015 11:33   Dg Chest Port 1 View  09/06/2015  CLINICAL DATA:  Weakness, shortness of breath today, hypotension, history of hypertension, end-stage liver disease, acute on chronic renal failure, esophageal varices, type II diabetes mellitus, secondary biliary cirrhosis EXAM: PORTABLE CHEST 1 VIEW COMPARISON:  Portable exam 1331 hours compared to 08/27/2015 FINDINGS: Very low lung volumes. Enlargement of cardiac silhouette. Mediastinal contours normal. Bibasilar atelectasis and minimal central peribronchial thickening. No gross infiltrate, pleural effusion or pneumothorax. Bones demineralized. IMPRESSION: Enlargement of cardiac silhouette. Bronchitic changes with very low lung volumes and bibasilar atelectasis. Electronically Signed   By: Lavonia Dana M.D.   On: 09/06/2015 13:47        Filed Weights   09/07/15  0100 09/08/15 1850  Weight: 84.505 kg (186 lb 4.8 oz) 83.915 kg (185 lb)     Microbiology: Recent Results (from the past 240 hour(s))  Culture, blood (routine x 2)     Status: None (Preliminary result)   Collection Time: 09/06/15  7:20 PM  Result Value Ref Range Status   Specimen Description BLOOD LEFT ARM  Final   Special Requests BOTTLES DRAWN AEROBIC AND ANAEROBIC 5CC  Final   Culture NO GROWTH 3 DAYS  Final   Report Status PENDING  Incomplete  Culture, blood (routine x 2)     Status: None (Preliminary result)   Collection Time: 09/06/15  7:25 PM  Result Value Ref Range Status   Specimen Description BLOOD LEFT ARM  Final   Special Requests BOTTLES DRAWN AEROBIC AND ANAEROBIC 5CC  Final   Culture NO GROWTH 3 DAYS  Final   Report Status PENDING  Incomplete  MRSA PCR Screening     Status: None   Collection Time: 09/06/15 11:36 PM  Result Value Ref Range Status   MRSA by PCR NEGATIVE NEGATIVE Final    Comment:        The GeneXpert MRSA Assay (FDA approved for NASAL specimens only), is one component of a comprehensive  MRSA colonization surveillance program. It is not intended to diagnose MRSA infection nor to guide or monitor treatment for MRSA infections.   Culture, body fluid-bottle     Status: None (Preliminary result)   Collection Time: 09/07/15  9:45 AM  Result Value Ref Range Status   Specimen Description FLUID PERITONEAL  Final   Special Requests NONE  Final   Culture NO GROWTH 2 DAYS  Final   Report Status PENDING  Incomplete  Gram stain     Status: None   Collection Time: 09/07/15  9:45 AM  Result Value Ref Range Status   Specimen Description FLUID PERITONEAL  Final   Special Requests NONE  Final   Gram Stain   Final    DEGENERATED CELLULAR MATERIAL PRESENT RBC PRESENT NO ORGANISMS SEEN    Report Status 09/07/2015 FINAL  Final  Gram stain     Status: None   Collection Time: 09/09/15 12:54 PM  Result Value Ref Range Status   Specimen Description  PERITONEAL ABDOMEN  Final   Special Requests NONE  Final   Gram Stain   Final    RARE WBC PRESENT, PREDOMINANTLY MONONUCLEAR NO ORGANISMS SEEN    Report Status 09/09/2015 FINAL  Final       Blood Culture    Component Value Date/Time   SDES PERITONEAL ABDOMEN 09/09/2015 1254   SPECREQUEST NONE 09/09/2015 1254   CULT NO GROWTH 2 DAYS 09/07/2015 0945   REPTSTATUS 09/09/2015 FINAL 09/09/2015 1254      Labs: Results for orders placed or performed during the hospital encounter of 09/06/15 (from the past 48 hour(s))  Glucose, capillary     Status: Abnormal   Collection Time: 09/08/15  4:47 PM  Result Value Ref Range   Glucose-Capillary 223 (H) 65 - 99 mg/dL   Comment 1 Capillary Specimen   Glucose, capillary     Status: Abnormal   Collection Time: 09/08/15  9:25 PM  Result Value Ref Range   Glucose-Capillary 191 (H) 65 - 99 mg/dL   Comment 1 Notify RN    Comment 2 Document in Chart   Comprehensive metabolic panel     Status: Abnormal   Collection Time: 09/09/15  6:00 AM  Result Value Ref Range   Sodium 134 (L) 135 - 145 mmol/L   Potassium 4.7 3.5 - 5.1 mmol/L   Chloride 106 101 - 111 mmol/L   CO2 19 (L) 22 - 32 mmol/L   Glucose, Bld 208 (H) 65 - 99 mg/dL   BUN 54 (H) 6 - 20 mg/dL   Creatinine, Ser 2.30 (H) 0.61 - 1.24 mg/dL   Calcium 9.4 8.9 - 10.3 mg/dL   Total Protein 5.4 (L) 6.5 - 8.1 g/dL   Albumin 2.9 (L) 3.5 - 5.0 g/dL   AST 26 15 - 41 U/L   ALT 23 17 - 63 U/L   Alkaline Phosphatase 90 38 - 126 U/L   Total Bilirubin 1.4 (H) 0.3 - 1.2 mg/dL   GFR calc non Af Amer 29 (L) >60 mL/min   GFR calc Af Amer 33 (L) >60 mL/min    Comment: (NOTE) The eGFR has been calculated using the CKD EPI equation. This calculation has not been validated in all clinical situations. eGFR's persistently <60 mL/min signify possible Chronic Kidney Disease.    Anion gap 9 5 - 15  CBC     Status: Abnormal   Collection Time: 09/09/15  6:00 AM  Result Value Ref Range   WBC 2.7 (L) 4.0 -  10.5 K/uL   RBC 3.36 (L) 4.22 - 5.81 MIL/uL   Hemoglobin 10.0 (L) 13.0 - 17.0 g/dL   HCT 29.2 (L) 39.0 - 52.0 %   MCV 86.9 78.0 - 100.0 fL   MCH 29.8 26.0 - 34.0 pg   MCHC 34.2 30.0 - 36.0 g/dL   RDW 15.4 11.5 - 15.5 %   Platelets 18 (LL) 150 - 400 K/uL    Comment: CRITICAL VALUE NOTED.  VALUE IS CONSISTENT WITH PREVIOUSLY REPORTED AND CALLED VALUE.  Ammonia     Status: Abnormal   Collection Time: 09/09/15  6:00 AM  Result Value Ref Range   Ammonia 56 (H) 9 - 35 umol/L  Glucose, capillary     Status: Abnormal   Collection Time: 09/09/15  7:53 AM  Result Value Ref Range   Glucose-Capillary 184 (H) 65 - 99 mg/dL  Protein, pleural or peritoneal fluid     Status: None   Collection Time: 09/09/15 11:36 AM  Result Value Ref Range   Total protein, fluid <3.0 g/dL    Comment: (NOTE) No normal range established for this test Results should be evaluated in conjunction with serum values    Fluid Type-FTP Peritoneal   Body fluid cell count with differential     Status: None   Collection Time: 09/09/15 11:36 AM  Result Value Ref Range   Fluid Type-FCT Peritoneal    Color, Fluid YELLOW YELLOW   Appearance, Fluid CLEAR CLEAR   WBC, Fluid 81 0 - 1000 cu mm   Neutrophil Count, Fluid 8 0 - 25 %   Lymphs, Fluid 17 %   Monocyte-Macrophage-Serous Fluid 75 50 - 90 %   Other Cells, Fluid BACTERIA PRESENT CORRELATE WITH MICRO %  Glucose, capillary     Status: Abnormal   Collection Time: 09/09/15 12:03 PM  Result Value Ref Range   Glucose-Capillary 321 (H) 65 - 99 mg/dL  Gram stain     Status: None   Collection Time: 09/09/15 12:54 PM  Result Value Ref Range   Specimen Description PERITONEAL ABDOMEN    Special Requests NONE    Gram Stain      RARE WBC PRESENT, PREDOMINANTLY MONONUCLEAR NO ORGANISMS SEEN    Report Status 09/09/2015 FINAL   Glucose, capillary     Status: Abnormal   Collection Time: 09/09/15  5:04 PM  Result Value Ref Range   Glucose-Capillary 260 (H) 65 - 99 mg/dL   Glucose, capillary     Status: Abnormal   Collection Time: 09/09/15  9:00 PM  Result Value Ref Range   Glucose-Capillary 242 (H) 65 - 99 mg/dL  Comprehensive metabolic panel     Status: Abnormal   Collection Time: 09/10/15  5:35 AM  Result Value Ref Range   Sodium 134 (L) 135 - 145 mmol/L   Potassium 4.3 3.5 - 5.1 mmol/L   Chloride 106 101 - 111 mmol/L   CO2 18 (L) 22 - 32 mmol/L   Glucose, Bld 304 (H) 65 - 99 mg/dL   BUN 52 (H) 6 - 20 mg/dL   Creatinine, Ser 2.34 (H) 0.61 - 1.24 mg/dL   Calcium 9.1 8.9 - 10.3 mg/dL   Total Protein 5.4 (L) 6.5 - 8.1 g/dL   Albumin 3.0 (L) 3.5 - 5.0 g/dL   AST 25 15 - 41 U/L   ALT 22 17 - 63 U/L   Alkaline Phosphatase 104 38 - 126 U/L   Total Bilirubin 1.1 0.3 - 1.2 mg/dL   GFR calc non  Af Amer 28 (L) >60 mL/min   GFR calc Af Amer 32 (L) >60 mL/min    Comment: (NOTE) The eGFR has been calculated using the CKD EPI equation. This calculation has not been validated in all clinical situations. eGFR's persistently <60 mL/min signify possible Chronic Kidney Disease.    Anion gap 10 5 - 15  CBC     Status: Abnormal   Collection Time: 09/10/15  5:35 AM  Result Value Ref Range   WBC 3.1 (L) 4.0 - 10.5 K/uL   RBC 3.29 (L) 4.22 - 5.81 MIL/uL   Hemoglobin 10.1 (L) 13.0 - 17.0 g/dL   HCT 28.7 (L) 39.0 - 52.0 %   MCV 87.2 78.0 - 100.0 fL   MCH 30.7 26.0 - 34.0 pg   MCHC 35.2 30.0 - 36.0 g/dL   RDW 15.6 (H) 11.5 - 15.5 %   Platelets 20 (LL) 150 - 400 K/uL    Comment: CRITICAL VALUE NOTED.  VALUE IS CONSISTENT WITH PREVIOUSLY REPORTED AND CALLED VALUE. REPEATED TO VERIFY   Ammonia     Status: Abnormal   Collection Time: 09/10/15  5:35 AM  Result Value Ref Range   Ammonia 74 (H) 9 - 35 umol/L  Glucose, capillary     Status: Abnormal   Collection Time: 09/10/15  7:55 AM  Result Value Ref Range   Glucose-Capillary 242 (H) 65 - 99 mg/dL  Glucose, capillary     Status: Abnormal   Collection Time: 09/10/15 12:01 PM  Result Value Ref Range    Glucose-Capillary 282 (H) 65 - 99 mg/dL     Lipid Panel     Component Value Date/Time   CHOL  05/13/2008 0107    106        ATP III CLASSIFICATION:  <200     mg/dL   Desirable  200-239  mg/dL   Borderline High  >=240    mg/dL   High   TRIG 73 05/13/2008 0107   HDL 25* 05/13/2008 0107   CHOLHDL 4.2 05/13/2008 0107   VLDL 15 05/13/2008 0107   LDLCALC  05/13/2008 0107    66        Total Cholesterol/HDL:CHD Risk Coronary Heart Disease Risk Table                     Men   Women  1/2 Average Risk   3.4   3.3     Lab Results  Component Value Date   HGBA1C 6.6* 09/06/2015   HGBA1C 6.4* 04/26/2015   HGBA1C 8.7* 10/22/2014     Lab Results  Component Value Date   LDLCALC  05/13/2008    66        Total Cholesterol/HDL:CHD Risk Coronary Heart Disease Risk Table                     Men   Women  1/2 Average Risk   3.4   3.3   CREATININE 2.34* 09/10/2015     HPI :65 y.o. 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 Patient had been admitted for hepatic encephalopathy. After that time patient was mentally clear until last night when he began acting odd and very weak. Patient did not seem to know where he was. Wife states that after leaving [facility] on the 24th he was at his baseline with regards to mentation as well as physical health. Diet  while poor was at baseline. Patient was able to angulate with walker. Patient has had increasing fatigue over the last several days. Patient was compliant with all home medications including his lactulose 4 times daily. Of note however patient has only had one bowel movement since being discharged from hospital on the 24th. Patient does go for paracentesis approximately every 3 days and he gets 3 L of fluid off at each of those draws. Patient has not had any complaints of abdominal pain, fevers, nausea, vomiting, chest pain,  palpitations, cough, shortness of breath   HOSPITAL COURSE:    Acute hepatic encephalopathy Ammonia 178 at admit - much improved w/ lactulose enemas - pt/wife swear to compliance w/ home meds - ammonia holding steady at ~60 w/ home oral lactulose dose - ascites fluid studies not c/w SBP - unclear why pt keeps getting encephalopathic at home - ammonia level 74 prior to discharge, repeat ammonia level in 3-5 days  Advance cirrhosis due to NASH MELD presently 21 (50-76% 3 month mortality) - has f/u appointment at [facility] 09/19/15 To be considered for transplant - due for repeat/scheduled paracentesis 09/10/15 at [facility] in IR , patient had paracentesis on 3/5 with removal of 5 L of fluid, no evidence of SBP  Albumin administered after procedure Not a candidate for Pleurx Catheter , as the patient is being considered for transplant  Thrombocytopenia Follow trend - avoid ASA or anticoag   Anemia of chronic disease  Hgb appears stable at this time - follow  Esophageal varices  No evidence of active bleeding at this time   CKD - hepatorenal syndrome  Most recent crt baseline appears to be ~2.4 but his is an increase from baseline as low as 1.5 in January of this year - stable at this time   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 rate 16, height _0  (1.778 m), weight 83.915 kg (185 lb), SpO2 96 %.   General: No acute respiratory distress - alert - less confused  Lungs: Clear to auscultation bilaterally  Cardiovascular: Regular rate and rhythm  Abdomen: Nontender, soft, bowel  sounds positive, no rebound, no appreciable mass Extremities: No significant cyanosis, clubbing, edema bilateral lower extremities    Follow-up Information    Follow up with [facility]   Specialty:  [facility]  Why:  Will call in next 1 to 2 days to set up home health appointment   Contact information:   [phone]      Follow up with BCharletta Cousin MD. Schedule an appointment as soon as possible for a visit in 1 week.   Specialty:  Family Medicine   Contact information:   [phone]      Signed: AReyne Dumas3/12/2015, 1:42 PM        Time spent >45 mins

## 2015-09-10 NOTE — Progress Notes (Signed)
Patient was discharged home by MD order; discharged instructions  review and give to patient and his wife with care notes and prescriptions; IV DIC; patient will be escorted to the car by nurse tech via wheelchair.  

## 2015-09-10 NOTE — Evaluation (Signed)
Occupational Therapy Evaluation Patient Details Name: [name] MRN: [id number] DOB: [birth year] Today's Date: 09/10/2015    History of Present Illness 66yo M discharged from [facility] after a four-day stay on 08/31/2015 after being admitted for hepatic encephalopathy. After d/c the pt remained mentally clear until the night prior to this readmit when he began acting odd and very weak. Patient was compliant with all home medications including his lactulose 4 times daily. Of note however patient only had one bowel movement post discharge. Patient has routine paracentesis approximately every 3 days and gets 3 L of fluid off at each of those draws.    Clinical Impression   Patient presenting with decreased ADL and functional mobility independence. Patient supervision to occasional min assist PTA. Patient currently functioning at an overall min assist level. Patient will benefit from acute OT to increase overall independence in the areas of ADLs, functional mobility, and overall safety in order to safely discharge home with wife.   BP readings: Supine: 91/56 Seated EOB: 89/66 Standing: 86/55 After activity and sitting: 106/65    Follow Up Recommendations  Home health OT;Supervision/Assistance - 24 hour    Equipment Recommendations  None recommended by OT    Recommendations for Other Services  None at this time   Precautions / Restrictions Precautions Precautions: Fall Precaution Comments: watch BP Restrictions Weight Bearing Restrictions: No    Mobility Bed Mobility Overal bed mobility: Needs Assistance Bed Mobility: Supine to Sit     Supine to sit: Supervision     General bed mobility comments: Supervision for safety   Transfers Overall transfer level: Needs assistance Equipment used: Rolling walker (2 wheeled) Transfers: Sit to/from Stand Sit to Stand: Supervision;Min guard         General transfer comment: Verbal cues for hand placement, RW safety  during transfers, and incr time    Balance Overall balance assessment: Needs assistance Sitting-balance support: No upper extremity supported;Feet supported Sitting balance-Leahy Scale: Good     Standing balance support: Bilateral upper extremity supported;During functional activity Standing balance-Leahy Scale: Fair Standing balance comment: heavy reliance on RW for UE support     ADL Overall ADL's : Needs assistance/impaired Eating/Feeding: Set up;Sitting   Grooming: Min guard;Standing   Upper Body Bathing: Minimal assitance;Sitting   Lower Body Bathing: Minimal assistance;Sit to/from stand   Upper Body Dressing : Minimal assistance;Sitting   Lower Body Dressing: Minimal assistance;Sit to/from stand   Toilet Transfer: Minimal assistance;Cueing for safety;Grab bars;RW;Comfort height toilet;Ambulation   Toileting- Clothing Manipulation and Hygiene: Moderate assistance;Sit to/from stand;Cueing for safety     Tub/Shower Transfer Details (indicate cue type and reason): did not occur Functional mobility during ADLs: Minimal assistance;Cueing for safety;Cueing for sequencing;Rolling walker General ADL Comments: Pt found supine in bed, sleepy with wife present at bedside. BP checked in supine=91/56. Pt engaged in bed mobility and sat EOB, BP=89/66, standing=86/55. Pt adament about walking to BR. Min guard to min assist to ambulate into BR using RW. Pt sat on comfort height toilet seat using grab bars to assist, wife assisted with toileting hygiene. Pt then ambulated back to bed side chair. BP=106/65. Left pt seated in chair with wife and RN present.     Vision Additional Comments: no change from baseline          Pertinent Vitals/Pain Pain Assessment: No/denies pain     Hand Dominance Right   Extremity/Trunk Assessment Upper Extremity Assessment Upper Extremity Assessment: Generalized weakness   Lower Extremity Assessment Lower Extremity Assessment:  Defer to PT  evaluation   Cervical / Trunk Assessment Cervical / Trunk Assessment: Kyphotic   Communication Communication Communication: No difficulties   Cognition Arousal/Alertness: Awake/alert Behavior During Therapy: WFL for tasks assessed/performed;Flat affect Overall Cognitive Status: Impaired/Different from baseline Area of Impairment: Orientation;Problem solving   Problem Solving: Slow processing;Requires verbal cues;Requires tactile cues General Comments: pt needed increased time to state date, first stated 2077, then was able to state 2017 (self-correction). Pt with low BP, but adament to walk to BR.               Home Living Family/patient expects to be discharged to:: Private residence Living Arrangements: Spouse/significant other Available Help at Discharge: Family Type of Home: House Home Access: Stairs to enter [employer] of Steps: 2 Entrance Stairs-Rails: Right;Left;Can reach both Home Layout: Multi-level;Able to live on main level with bedroom/bathroom Alternate Level Stairs-Number of Steps: 1 flight Alternate Level Stairs-Rails: Right;Left Bathroom Shower/Tub: Teacher, early years/pre: Standard Bathroom Accessibility: Yes   Home Equipment: Environmental consultant - 2 wheels;Shower seat;Cane - single point;Bedside commode   Additional Comments: Works as a Administrator for [employer]      Prior Functioning/Environment Level of Independence: Needs assistance  Gait / Transfers Assistance Needed: Has been amb with walker since hospital admission a few weeks ago. ADL's / Homemaking Assistance Needed: wife assists with dressing PRN. pt usually able to bathe himself.  Communication / Swallowing Assistance Needed: independent  Comments: using walker in community. furniture walk inside home.     OT Diagnosis: Generalized weakness;Cognitive deficits   OT Problem List: Decreased strength;Decreased activity tolerance;Impaired balance (sitting and/or  standing);Decreased cognition;Decreased safety awareness;Decreased knowledge of use of DME or AE;Decreased knowledge of precautions   OT Treatment/Interventions: Self-care/ADL training;DME and/or AE instruction;Patient/family education;Cognitive remediation/compensation;Therapeutic activities;Therapeutic exercise    OT Goals(Current goals can be found in the care plan section) Acute Rehab OT Goals Patient Stated Goal: return home OT Goal Formulation: With patient Time For Goal Achievement: 09/24/15 Potential to Achieve Goals: Good ADL Goals Pt Will Perform Grooming: with supervision;standing (at sink) Pt Will Perform Lower Body Bathing: with supervision;sit to/from stand;with adaptive equipment (AE prn) Pt Will Perform Lower Body Dressing: sit to/from stand;with adaptive equipment (AE prn) Pt Will Transfer to Toilet: ambulating;bedside commode;with supervision Additional ADL Goal #1: Pt will be supervision with functional mobility using LRAD  OT Frequency: Min 2X/week   Barriers to D/C: none known at this time   End of Session Equipment Utilized During Treatment: Gait belt;Rolling walker Nurse Communication: Mobility status  Activity Tolerance: Patient tolerated treatment well Patient left: in chair;with call bell/phone within reach;with nursing/sitter in room;with family/visitor present (bed side chair)   Time: [id number] OT Time Calculation (min): 31 min Charges:  OT General Charges $OT Visit: 1 Procedure OT Evaluation $OT Eval Moderate Complexity: 1 Procedure OT Treatments $Self Care/Home Management : 8-22 mins  Chrys Racer , MS, OTR/L, CLT Pager: [phone]  09/10/2015, 1:21 PM

## 2015-09-10 NOTE — Progress Notes (Addendum)
Inpatient Diabetes Program Recommendations  AACE/ADA: New Consensus Statement on Inpatient Glycemic Control (2015)  Target Ranges:  Prepandial:   less than 140 mg/dL      Peak postprandial:   less than 180 mg/dL (1-2 hours)      Critically ill patients:  140 - 180 mg/dL   Results for MURL, NAKAZAWA (MRN [id number]) as of 09/10/2015 09:55  Ref. Range 09/09/2015 07:53 09/09/2015 12:03 09/09/2015 17:04 09/09/2015 21:00 09/10/2015 07:55  Glucose-Capillary Latest Ref Range: 65-99 mg/dL 184 (H) 321 (H) 260 (H) 242 (H) 242 (H)   Review of Glycemic Control  Diabetes history: DM 2 Outpatient Diabetes medications: Tresiba 20 units Daily per med rec Current orders for Inpatient glycemic control: Novolog Sensitive TID  Inpatient Diabetes Program Recommendations: Correction (SSI): Glucose trending upward into the 200's. Please consider increasing correction to Novolog Moderate TID + HS scale. May need to add low dose basal while inpatient.  Thanks,  Tama Headings RN, MSN, [facility] Inpatient Diabetes Coordinator Team Pager [phone] (8a-5p)

## 2015-09-10 NOTE — Telephone Encounter (Signed)
Pts wife states he is in the hospital and only has one more paracentesis scheduled for this Thursday. Wife wants to know if we are going to continue these on Mondays and Thursdays. Also wants to know if they are still to only draw off 3 liters of fluid. States the hospital drew off 5 liters yesterday. Please advise.

## 2015-09-10 NOTE — Evaluation (Signed)
Physical Therapy Evaluation Patient Details Name: [name] MRN: [id number] DOB: Dec 07, 1950 Today's Date: 09/10/2015   History of Present Illness  Pt adm with recurrent hepatic encephalopathy. PMH - NASH cirrhosis, GI bleed, epidural abcess.  Clinical Impression  Pt admitted with above diagnosis and presents to PT with functional limitations due to deficits listed below (See PT problem list). Pt needs skilled PT to maximize independence and safety to allow discharge to home with wife.      Follow Up Recommendations Home health PT    Equipment Recommendations  None recommended by PT    Recommendations for Other Services       Precautions / Restrictions Precautions Precautions: Fall Precaution Comments: watch BP Restrictions Weight Bearing Restrictions: No      Mobility  Bed Mobility Overal bed mobility: Needs Assistance Bed Mobility: Supine to Sit     Supine to sit: Supervision     General bed mobility comments: Supervision for safety   Transfers Overall transfer level: Needs assistance Equipment used: Rolling walker (2 wheeled) Transfers: Sit to/from Stand Sit to Stand: Supervision;Min guard         General transfer comment: Incr time  Ambulation/Gait Ambulation/Gait assistance: Min guard Ambulation Distance (Feet): 200 Feet Assistive device: Rolling walker (2 wheeled) Gait Pattern/deviations: Step-through pattern;Decreased stride length;Trunk flexed Gait velocity: decr Gait velocity interpretation: Below normal speed for age/gender General Gait Details: Verbal cues to stay closer to walker at times  Stairs            Wheelchair Mobility    Modified Rankin (Stroke Patients Only)       Balance Overall balance assessment: Needs assistance Sitting-balance support: No upper extremity supported Sitting balance-Leahy Scale: Good     Standing balance support: No upper extremity supported;During functional activity Standing balance-Leahy  Scale: Fair Standing balance comment: heavy reliance on RW for UE support                              Pertinent Vitals/Pain Pain Assessment: No/denies pain    Home Living Family/patient expects to be discharged to:: Private residence Living Arrangements: Spouse/significant other Available Help at Discharge: Family Type of Home: House Home Access: Stairs to enter Entrance Stairs-Rails: Right;Left;Can reach both Entrance Stairs-Number of Steps: 2 Home Layout: Multi-level;Able to live on main level with bedroom/bathroom Home Equipment: Gilford Rile - 2 wheels;Shower seat;Cane - single point;Bedside commode 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  ADL's / Homemaking Assistance Needed: wife assists with dressing PRN. pt usually able to bathe himself.   Comments: using walker in community. furniture walk inside home.      Hand Dominance   Dominant Hand: Right    Extremity/Trunk Assessment   Upper Extremity Assessment: Defer to OT evaluation           Lower Extremity Assessment: Generalized weakness      Cervical / Trunk Assessment: Kyphotic  Communication   Communication: No difficulties  Cognition Arousal/Alertness: Awake/alert Behavior During Therapy: WFL for tasks assessed/performed;Flat affect Overall Cognitive Status: Impaired/Different from baseline Area of Impairment: Orientation;Problem solving             Problem Solving: Slow processing;Requires verbal cues General Comments: pt needed increased time to state date, first stated 2077, then was able to  state 2017 (self-correction). Pt with low BP, but adament to walk to BR.     General Comments      Exercises        Assessment/Plan    PT Assessment Patient needs continued PT services  PT Diagnosis  Generalized weakness;Difficulty walking   PT Problem List Decreased strength;Decreased activity tolerance;Decreased balance;Decreased mobility  PT Treatment Interventions DME instruction;Gait training;Functional mobility training;Therapeutic activities;Therapeutic exercise;Balance training;Patient/family education   PT Goals (Current goals can be found in the Care Plan section) Acute Rehab PT Goals Patient Stated Goal: return home PT Goal Formulation: With patient Time For Goal Achievement: 09/17/15 Potential to Achieve Goals: Good    Frequency Min 3X/week   Barriers to discharge        Co-evaluation               End of Session Equipment Utilized During Treatment: Gait belt Activity Tolerance: Patient tolerated treatment well Patient left: in chair;with call bell/phone within reach;with family/visitor present Nurse Communication: Mobility status         Time: [id number] PT Time Calculation (min) (ACUTE ONLY): 25 min   Charges:   PT Evaluation $PT Eval Moderate Complexity: 1 Procedure PT Treatments $Gait Training: 8-22 mins   PT G Codes:        Oona Trammel 2015/09/30, 2:15 PM [employer] PT [phone]

## 2015-09-10 NOTE — Telephone Encounter (Signed)
At this point, can increase to 4 L twice weekly with albumin replacement.

## 2015-09-10 NOTE — Care Management Note (Signed)
Case Management Note  Patient Details  Name: BOSS KUBIK MRN: [id number] Date of Birth: [birth year]  Subjective/Objective:                 Spoke with patient's wife over he phone, agreeable to resume [facility] PT RN with [facility] at [place], referral called in to Marian Sorrow [phone]   Action/Plan:   Expected Discharge Date:                  Expected Discharge Plan:  [place]  In-House Referral:     Discharge planning Services  CM Consult  Post Acute Care Choice:  Home Health Choice offered to:  Patient, Spouse  DME Arranged:    DME Agency:     HH Arranged:  RN, PT [place] Agency:  [facility]  Status of Service:  Completed, signed off  Medicare Important Message Given:    Date Medicare IM Given:    Medicare IM give by:    Date Additional Medicare IM Given:    Additional Medicare Important Message give by:     If discussed at [place] of Stay Meetings, dates discussed:    Additional Comments:  Carles Collet, RN 09/10/2015, 10:53 AM

## 2015-09-11 ENCOUNTER — Other Ambulatory Visit: Payer: Self-pay

## 2015-09-11 DIAGNOSIS — R188 Other ascites: Secondary | ICD-10-CM

## 2015-09-11 LAB — PH, BODY FLUID: pH, Body Fluid: 7.8

## 2015-09-11 LAB — CULTURE, BLOOD (ROUTINE X 2)
Culture: NO GROWTH
Culture: NO GROWTH

## 2015-09-11 LAB — PATHOLOGIST SMEAR REVIEW

## 2015-09-11 NOTE — Telephone Encounter (Signed)
Pt scheduled for [place] para with albumin replacement. No more than 4 liters to be removed, pt to receive albumin 8gm IV for each liter removed, fluid to be sent for cell count and diff. All appointment are scheduled at [facility] and pt is to arrive 15 minutes before scheduled appt time.  Appointments: 09/17/15@12 :30pm 09/20/15@1pm  09/24/15@11 :30am 09/27/15@11am  10/01/15@1pm  10/04/15@11am   Left message for pt to call back.

## 2015-09-11 NOTE — Telephone Encounter (Signed)
Pts wife aware of appts.

## 2015-09-12 LAB — CULTURE, BODY FLUID W GRAM STAIN -BOTTLE: Culture: NO GROWTH

## 2015-09-13 ENCOUNTER — Ambulatory Visit (HOSPITAL_COMMUNITY)
Admission: RE | Admit: 2015-09-13 | Discharge: 2015-09-13 | Disposition: A | Discharge status: Home / Self Care | Payer: Managed Care, Other (non HMO) | Source: Ambulatory Visit | Attending: Internal Medicine | Admitting: Internal Medicine

## 2015-09-13 DIAGNOSIS — R188 Other ascites: Secondary | ICD-10-CM

## 2015-09-13 LAB — BODY FLUID CELL COUNT WITH DIFFERENTIAL
Eos, Fluid: 1 %
LYMPHS FL: 23 %
Monocyte-Macrophage-Serous Fluid: 71 % (ref 50–90)
Neutrophil Count, Fluid: 5 % (ref 0–25)
Total Nucleated Cell Count, Fluid: 39 cu mm (ref 0–1000)

## 2015-09-13 MED ORDER — LIDOCAINE HCL (PF) 1 % IJ SOLN
INTRAMUSCULAR | Status: AC
  Filled 2015-09-13: qty 10

## 2015-09-13 MED ORDER — ALBUMIN HUMAN 25 % IV SOLN
25.0000 g | Freq: Once | INTRAVENOUS | Status: AC
  Administered 2015-09-13: 25 g via INTRAVENOUS
  Filled 2015-09-13: qty 100

## 2015-09-13 NOTE — Procedures (Signed)
Successful US guided paracentesis from [facility].  Yielded 3L of clear, amber fluid.  No immediate complications.  Pt tolerated well.   Specimen was sent for labs.  Ascencion Dike PA-C 09/13/2015 2:21 PM

## 2015-09-14 ENCOUNTER — Encounter (HOSPITAL_COMMUNITY): Payer: Self-pay | Admitting: Internal Medicine

## 2015-09-14 ENCOUNTER — Telehealth: Payer: Self-pay | Admitting: Internal Medicine

## 2015-09-14 ENCOUNTER — Inpatient Hospital Stay (HOSPITAL_COMMUNITY): Payer: Managed Care, Other (non HMO)

## 2015-09-14 ENCOUNTER — Inpatient Hospital Stay (HOSPITAL_COMMUNITY)
Admission: EM | Admit: 2015-09-14 | Discharge: 2015-09-21 | DRG: 441 | Disposition: A | Discharge status: Other Acute Inpatient Hospital | Payer: Managed Care, Other (non HMO) | Attending: Internal Medicine | Admitting: Internal Medicine

## 2015-09-14 DIAGNOSIS — Z66 Do not resuscitate: Secondary | ICD-10-CM | POA: Diagnosis present

## 2015-09-14 DIAGNOSIS — D689 Coagulation defect, unspecified: Secondary | ICD-10-CM | POA: Diagnosis present

## 2015-09-14 DIAGNOSIS — E1140 Type 2 diabetes mellitus with diabetic neuropathy, unspecified: Secondary | ICD-10-CM | POA: Diagnosis present

## 2015-09-14 DIAGNOSIS — K219 Gastro-esophageal reflux disease without esophagitis: Secondary | ICD-10-CM | POA: Diagnosis present

## 2015-09-14 DIAGNOSIS — R188 Other ascites: Secondary | ICD-10-CM | POA: Diagnosis present

## 2015-09-14 DIAGNOSIS — D61818 Other pancytopenia: Secondary | ICD-10-CM | POA: Diagnosis present

## 2015-09-14 DIAGNOSIS — N400 Enlarged prostate without lower urinary tract symptoms: Secondary | ICD-10-CM | POA: Diagnosis present

## 2015-09-14 DIAGNOSIS — K7460 Unspecified cirrhosis of liver: Secondary | ICD-10-CM | POA: Diagnosis present

## 2015-09-14 DIAGNOSIS — N179 Acute kidney failure, unspecified: Secondary | ICD-10-CM | POA: Diagnosis present

## 2015-09-14 DIAGNOSIS — I8510 Secondary esophageal varices without bleeding: Secondary | ICD-10-CM | POA: Diagnosis present

## 2015-09-14 DIAGNOSIS — N184 Chronic kidney disease, stage 4 (severe): Secondary | ICD-10-CM | POA: Diagnosis present

## 2015-09-14 DIAGNOSIS — E860 Dehydration: Secondary | ICD-10-CM | POA: Diagnosis present

## 2015-09-14 DIAGNOSIS — K7581 Nonalcoholic steatohepatitis (NASH): Secondary | ICD-10-CM | POA: Diagnosis present

## 2015-09-14 DIAGNOSIS — E1121 Type 2 diabetes mellitus with diabetic nephropathy: Secondary | ICD-10-CM | POA: Diagnosis present

## 2015-09-14 DIAGNOSIS — K767 Hepatorenal syndrome: Secondary | ICD-10-CM | POA: Diagnosis present

## 2015-09-14 DIAGNOSIS — N183 Chronic kidney disease, stage 3 unspecified: Secondary | ICD-10-CM | POA: Diagnosis present

## 2015-09-14 DIAGNOSIS — E1122 Type 2 diabetes mellitus with diabetic chronic kidney disease: Secondary | ICD-10-CM | POA: Diagnosis present

## 2015-09-14 DIAGNOSIS — E7220 Disorder of urea cycle metabolism, unspecified: Secondary | ICD-10-CM | POA: Diagnosis present

## 2015-09-14 DIAGNOSIS — D638 Anemia in other chronic diseases classified elsewhere: Secondary | ICD-10-CM | POA: Diagnosis present

## 2015-09-14 DIAGNOSIS — E1165 Type 2 diabetes mellitus with hyperglycemia: Secondary | ICD-10-CM | POA: Diagnosis not present

## 2015-09-14 DIAGNOSIS — F32A Depression, unspecified: Secondary | ICD-10-CM | POA: Diagnosis present

## 2015-09-14 DIAGNOSIS — E876 Hypokalemia: Secondary | ICD-10-CM | POA: Diagnosis present

## 2015-09-14 DIAGNOSIS — R509 Fever, unspecified: Secondary | ICD-10-CM | POA: Diagnosis not present

## 2015-09-14 DIAGNOSIS — R579 Shock, unspecified: Secondary | ICD-10-CM | POA: Diagnosis present

## 2015-09-14 DIAGNOSIS — G9340 Encephalopathy, unspecified: Secondary | ICD-10-CM | POA: Diagnosis present

## 2015-09-14 DIAGNOSIS — K7200 Acute and subacute hepatic failure without coma: Secondary | ICD-10-CM | POA: Diagnosis present

## 2015-09-14 DIAGNOSIS — K7682 Hepatic encephalopathy: Secondary | ICD-10-CM

## 2015-09-14 DIAGNOSIS — K745 Biliary cirrhosis, unspecified: Secondary | ICD-10-CM | POA: Diagnosis present

## 2015-09-14 DIAGNOSIS — F329 Major depressive disorder, single episode, unspecified: Secondary | ICD-10-CM | POA: Diagnosis present

## 2015-09-14 DIAGNOSIS — K766 Portal hypertension: Secondary | ICD-10-CM | POA: Diagnosis present

## 2015-09-14 DIAGNOSIS — E872 Acidosis, unspecified: Secondary | ICD-10-CM | POA: Diagnosis present

## 2015-09-14 DIAGNOSIS — I8500 Esophageal varices without bleeding: Secondary | ICD-10-CM | POA: Diagnosis present

## 2015-09-14 DIAGNOSIS — Z6824 Body mass index (BMI) 24.0-24.9, adult: Secondary | ICD-10-CM

## 2015-09-14 DIAGNOSIS — E861 Hypovolemia: Secondary | ICD-10-CM | POA: Diagnosis present

## 2015-09-14 DIAGNOSIS — M4642 Discitis, unspecified, cervical region: Secondary | ICD-10-CM | POA: Diagnosis present

## 2015-09-14 DIAGNOSIS — K7290 Hepatic failure, unspecified without coma: Secondary | ICD-10-CM | POA: Diagnosis present

## 2015-09-14 DIAGNOSIS — K744 Secondary biliary cirrhosis: Secondary | ICD-10-CM | POA: Diagnosis present

## 2015-09-14 DIAGNOSIS — E43 Unspecified severe protein-calorie malnutrition: Secondary | ICD-10-CM | POA: Diagnosis present

## 2015-09-14 DIAGNOSIS — R040 Epistaxis: Secondary | ICD-10-CM | POA: Diagnosis not present

## 2015-09-14 DIAGNOSIS — E119 Type 2 diabetes mellitus without complications: Secondary | ICD-10-CM

## 2015-09-14 DIAGNOSIS — K222 Esophageal obstruction: Secondary | ICD-10-CM | POA: Diagnosis present

## 2015-09-14 DIAGNOSIS — D696 Thrombocytopenia, unspecified: Secondary | ICD-10-CM | POA: Diagnosis present

## 2015-09-14 LAB — CULTURE, BODY FLUID W GRAM STAIN -BOTTLE

## 2015-09-14 LAB — COMPREHENSIVE METABOLIC PANEL
ALT: 18 U/L (ref 17–63)
AST: 25 U/L (ref 15–41)
Albumin: 3.4 g/dL — ABNORMAL LOW (ref 3.5–5.0)
Alkaline Phosphatase: 75 U/L (ref 38–126)
Anion gap: 12 (ref 5–15)
BILIRUBIN TOTAL: 0.9 mg/dL (ref 0.3–1.2)
BUN: 59 mg/dL — AB (ref 6–20)
CALCIUM: 9 mg/dL (ref 8.9–10.3)
CO2: 14 mmol/L — ABNORMAL LOW (ref 22–32)
CREATININE: 3.37 mg/dL — AB (ref 0.61–1.24)
Chloride: 109 mmol/L (ref 101–111)
GFR, EST AFRICAN AMERICAN: 21 mL/min — AB (ref >60)
GFR, EST NON AFRICAN AMERICAN: 18 mL/min — AB (ref >60)
Glucose, Bld: 298 mg/dL — ABNORMAL HIGH (ref 65–99)
Potassium: 3.8 mmol/L (ref 3.5–5.1)
Sodium: 135 mmol/L (ref 135–145)
TOTAL PROTEIN: 5.4 g/dL — AB (ref 6.5–8.1)

## 2015-09-14 LAB — CBC WITH DIFFERENTIAL/PLATELET
BASOS ABS: 0 10*3/uL (ref 0.0–0.1)
Basophils Relative: 1 %
EOS PCT: 2 %
Eosinophils Absolute: 0.1 10*3/uL (ref 0.0–0.7)
HCT: 26.6 % — ABNORMAL LOW (ref 39.0–52.0)
Hemoglobin: 9.1 g/dL — ABNORMAL LOW (ref 13.0–17.0)
LYMPHS PCT: 16 %
Lymphs Abs: 0.4 10*3/uL — ABNORMAL LOW (ref 0.7–4.0)
MCH: 29.3 pg (ref 26.0–34.0)
MCHC: 34.2 g/dL (ref 30.0–36.0)
MCV: 85.5 fL (ref 78.0–100.0)
MONO ABS: 0.2 10*3/uL (ref 0.1–1.0)
Monocytes Relative: 7 %
Neutro Abs: 1.6 10*3/uL — ABNORMAL LOW (ref 1.7–7.7)
Neutrophils Relative %: 74 %
PLATELETS: 20 10*3/uL — AB (ref 150–400)
RBC: 3.11 MIL/uL — ABNORMAL LOW (ref 4.22–5.81)
RDW: 15.3 % (ref 11.5–15.5)
WBC: 2.1 10*3/uL — ABNORMAL LOW (ref 4.0–10.5)

## 2015-09-14 LAB — CULTURE, BODY FLUID-BOTTLE: CULTURE: NO GROWTH

## 2015-09-14 LAB — PATHOLOGIST SMEAR REVIEW

## 2015-09-14 LAB — MRSA PCR SCREENING: MRSA by PCR: NEGATIVE

## 2015-09-14 LAB — MAGNESIUM: Magnesium: 2.8 mg/dL — ABNORMAL HIGH (ref 1.7–2.4)

## 2015-09-14 LAB — I-STAT CG4 LACTIC ACID, ED: LACTIC ACID, VENOUS: 2.53 mmol/L — AB (ref 0.5–2.0)

## 2015-09-14 LAB — AMMONIA: AMMONIA: 271 umol/L — AB (ref 9–35)

## 2015-09-14 MED ORDER — LACTULOSE ENEMA
300.0000 mL | Freq: Once | ORAL | Status: AC
  Administered 2015-09-14: 300 mL via RECTAL
  Filled 2015-09-14: qty 300

## 2015-09-14 MED ORDER — ALUM & MAG HYDROXIDE-SIMETH 200-200-20 MG/5ML PO SUSP: 30.0000 mL | Freq: Four times a day (QID) | ORAL | Status: DC | PRN

## 2015-09-14 MED ORDER — STERILE WATER FOR INJECTION IV SOLN
INTRAVENOUS | Status: DC
  Administered 2015-09-14 – 2015-09-17 (×4): via INTRAVENOUS
  Filled 2015-09-14 (×10): qty 850

## 2015-09-14 MED ORDER — AMPICILLIN SODIUM 1 G IJ SOLR
1.0000 g | Freq: Three times a day (TID) | INTRAMUSCULAR | Status: DC
Start: 2015-09-14 — End: 2015-09-19
  Administered 2015-09-14 – 2015-09-19 (×14): 1 g via INTRAVENOUS
  Filled 2015-09-14 (×17): qty 1000

## 2015-09-14 MED ORDER — SODIUM CHLORIDE 0.9 % IV BOLUS (SEPSIS)
500.0000 mL | Freq: Once | INTRAVENOUS | Status: AC
  Administered 2015-09-14: 500 mL via INTRAVENOUS

## 2015-09-14 MED ORDER — INSULIN ASPART 100 UNIT/ML ~~LOC~~ SOLN
0.0000 [IU] | Freq: Three times a day (TID) | SUBCUTANEOUS | Status: DC
  Administered 2015-09-15 – 2015-09-16 (×3): 1 [IU] via SUBCUTANEOUS
  Administered 2015-09-16: 2 [IU] via SUBCUTANEOUS
  Administered 2015-09-17: 3 [IU] via SUBCUTANEOUS
  Administered 2015-09-17: 5 [IU] via SUBCUTANEOUS

## 2015-09-14 MED ORDER — LACTULOSE ENEMA
300.0000 mL | Freq: Three times a day (TID) | ORAL | Status: DC
  Filled 2015-09-14 (×3): qty 300

## 2015-09-14 MED ORDER — ONDANSETRON HCL 4 MG/2ML IJ SOLN
4.0000 mg | Freq: Four times a day (QID) | INTRAMUSCULAR | Status: DC | PRN
  Administered 2015-09-14 – 2015-09-20 (×4): 4 mg via INTRAVENOUS
  Filled 2015-09-14 (×4): qty 2

## 2015-09-14 MED ORDER — DEXTROSE 5 % IV SOLN: 1.0000 g | INTRAVENOUS | Status: DC

## 2015-09-14 MED ORDER — PANTOPRAZOLE SODIUM 40 MG IV SOLR
40.0000 mg | INTRAVENOUS | Status: DC
  Administered 2015-09-14 – 2015-09-18 (×5): 40 mg via INTRAVENOUS
  Filled 2015-09-14 (×5): qty 40

## 2015-09-14 MED ORDER — ALBUTEROL SULFATE (2.5 MG/3ML) 0.083% IN NEBU
2.5000 mg | INHALATION_SOLUTION | RESPIRATORY_TRACT | Status: DC | PRN
Start: 2015-09-14 — End: 2015-09-21

## 2015-09-14 MED ORDER — ONDANSETRON HCL 4 MG PO TABS: 4.0000 mg | ORAL_TABLET | Freq: Four times a day (QID) | ORAL | Status: DC | PRN

## 2015-09-14 MED ORDER — SODIUM CHLORIDE 0.9% FLUSH
3.0000 mL | Freq: Two times a day (BID) | INTRAVENOUS | Status: DC
  Administered 2015-09-14 – 2015-09-18 (×5): 3 mL via INTRAVENOUS

## 2015-09-14 NOTE — Telephone Encounter (Signed)
Pts wife states pt had a paracentesis yesterday and today he is very weak and can hardly walk. States he cannot answer questions and is very confused, sleeping a lot. Wife wanted to know if she should take him to the ER or if she could do something at home for him. Instructed the wife to take him to the ER that he may be encephalopathic again.

## 2015-09-14 NOTE — ED Provider Notes (Signed)
CSN: [id number]     Arrival date & time 09/14/15  1505 History   First MD Initiated Contact with Patient 09/14/15 1511     Chief Complaint  Patient presents with  . Altered Mental Status     (Consider location/radiation/quality/duration/timing/severity/associated sxs/prior Treatment) HPI Comments: Patient with history of nonalcoholic steatohepatitis, hepatic failure, recent admission for hyperammonemia, frequent paracentesis -- presents with acute onset of altered mental status this morning. Patient's wife state that he had paracentesis performed yesterday, was given albumin, and that his symptoms are typical post paracentesis. Patient is very weak today and had difficulty walking at home with a walker. He is not oriented. Wife called GI physician, [name], who recommended emergency department evaluation. Level V caveat due to altered mental status. Family denies any fevers. Patient denies abdominal pain currently.  The history is provided by the patient, medical records and a relative.    Past Medical History  Diagnosis Date  . Ascites   . Acute hepatic encephalopathy ([place])   . Acute renal failure superimposed on stage 3 chronic kidney disease ([place])   . Anemia of chronic disease   . Bleeding esophageal varices ([place])   . Coagulopathy ([place])   . Diabetes mellitus type II, controlled ([place])   . Depression   . Diabetic neuropathy ([place])   . Esophageal stricture   . NASH (nonalcoholic steatohepatitis)   . Protein-calorie malnutrition, severe ([place])   . Secondary biliary cirrhosis ([place])   . Thrombocytopenia ([place])   . 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 Kidney stone surgery  yrs ago  . Esophagogastroduodenoscopy  08/01/2011    Procedure: ESOPHAGOGASTRODUODENOSCOPY (EGD);  Surgeon: Scarlette Shorts, MD;  Location: [facility];  Service: Endoscopy;  Laterality: N/A;  . Esophagogastroduodenoscopy N/A 10/27/2012    Procedure:  ESOPHAGOGASTRODUODENOSCOPY (EGD);  Surgeon: Inda Castle, MD;  Location: [facility];  Service: Endoscopy;  Laterality: N/A;  . Esophagogastroduodenoscopy N/A 11/22/2012    Procedure: ESOPHAGOGASTRODUODENOSCOPY (EGD);  Surgeon: Irene Shipper, MD;  Location: [facility];  Service: Endoscopy;  Laterality: N/A;  . Colonoscopy    . Upper gastrointestinal endoscopy    . Esophagogastroduodenoscopy N/A 11/29/2013    Procedure: ESOPHAGOGASTRODUODENOSCOPY (EGD);  Surgeon: Irene Shipper, MD;  Location: [facility];  Service: Endoscopy;  Laterality: N/A;  . Esophageal banding Bilateral 11/29/2013    Procedure: ESOPHAGEAL BANDING;  Surgeon: Irene Shipper, MD;  Location: [facility];  Service: Endoscopy;  Laterality: Bilateral;  . Esophagogastroduodenoscopy N/A 06/13/2014    Procedure: ESOPHAGOGASTRODUODENOSCOPY (EGD);  Surgeon: Irene Shipper, MD;  Location: [facility];  Service: Endoscopy;  Laterality: N/A;  . Esophageal banding N/A 06/13/2014    Procedure: ESOPHAGEAL BANDING;  Surgeon: Irene Shipper, MD;  Location: [facility];  Service: Endoscopy;  Laterality: N/A;   Family History  Problem Relation Age of Onset  . Colon cancer Neg Hx   . Anesthesia problems Neg Hx   . Hypotension Neg Hx   . Malignant hyperthermia Neg Hx   . Pseudochol deficiency Neg Hx   . Diabetes Father   . Heart disease Father   . Aneurysm Mother   . Heart disease Other     neice  . Diabetes      niece   Social History  Substance Use Topics  . Smoking status: Never Smoker   . Smokeless tobacco: Never Used  . Alcohol Use: No     Comment: very rare  Review of Systems  Unable to perform ROS: Mental status change  Constitutional: Negative for fever.  Respiratory: Negative for cough.   Gastrointestinal: Positive for vomiting (one episode yesterday). Negative for abdominal pain.  Psychiatric/Behavioral: Positive for confusion.      Allergies  Review of patient's allergies indicates no known allergies.  Home  Medications   Prior to Admission medications   Medication Sig Start Date End Date Taking? Authorizing Provider  acidophilus (RISAQUAD) CAPS capsule Take 2 capsules by mouth daily. 09/10/15   Reyne Dumas, MD  Amino Acids-Protein Hydrolys (FEEDING SUPPLEMENT, PRO-STAT SUGAR FREE 64,) LIQD Take 30 mLs by mouth 3 (three) times daily. Patient taking differently: Take 30 mLs by mouth daily.  07/31/15   Belkys A Regalado, MD  amoxicillin (AMOXIL) 500 MG capsule Take 1 capsule (500 mg total) by mouth 3 (three) times daily. 08/13/15   Truman Hayward, MD  baclofen (LIORESAL) 20 MG tablet Take 1 tablet (20 mg total) by mouth at bedtime. 06/11/15   Meredith Staggers, MD  bethanechol (URECHOLINE) 50 MG tablet Take 1 tablet by mouth 2 (two) times daily.  12/19/14   Historical Provider, MD  FLUoxetine (PROZAC) 10 MG tablet Take 10 mg by mouth daily.    Historical Provider, MD  fluticasone (FLONASE) 50 MCG/ACT nasal spray Place 1 spray into both nostrils daily. 08/02/15   Historical Provider, MD  gabapentin (NEURONTIN) 100 MG capsule Take 100 mg by mouth 3 (three) times daily.     Historical Provider, MD  Insulin Degludec (TRESIBA FLEXTOUCH) 200 UNIT/ML SOPN Inject 20 Units into the skin daily. 07/31/15   Belkys A Regalado, MD  lactulose (CHRONULAC) 10 GM/15ML solution Take 45 mLs (30 g total) by mouth 3 (three) times daily. 09/10/15   Reyne Dumas, MD  Multiple Vitamin (MULTIVITAMIN WITH MINERALS) TABS tablet Take 1 tablet by mouth daily.    Historical Provider, MD  pantoprazole (PROTONIX) 40 MG tablet TAKE 1 TABLET (40 MG TOTAL) BY MOUTH DAILY. 11/13/14   Irene Shipper, MD  tamsulosin (FLOMAX) 0.4 MG CAPS capsule Take 1 capsule by mouth 2 (two) times daily.  12/19/14   Historical Provider, MD  Vitamin D, Ergocalciferol, (DRISDOL) 50000 UNITS CAPS capsule Take 50,000 Units by mouth every 7 (seven) days. Mondays. 03/29/15   Historical Provider, MD  XIFAXAN 550 MG TABS tablet TAKE 1 TABLET (550 MG TOTAL) BY MOUTH 2 (TWO)  TIMES DAILY. 08/27/15   Irene Shipper, MD   BP 94/59 mmHg  Pulse 67  Temp(Src) 97.9 F (36.6 C) (Oral)  Resp 10  SpO2 100%   Physical Exam  Constitutional: He appears well-developed and well-nourished.  HENT:  Head: Normocephalic and atraumatic.  Eyes: Conjunctivae are normal. Pupils are equal, round, and reactive to light. Right eye exhibits no discharge. Left eye exhibits no discharge.  Neck: Normal range of motion. Neck supple.  Cardiovascular: Normal rate, regular rhythm and normal heart sounds.   No murmur heard. Pulmonary/Chest: Effort normal and breath sounds normal. No respiratory distress. He has no wheezes. He has no rales.  Abdominal: Soft. He exhibits distension. There is no tenderness. There is no rebound and no guarding.  Ascites present. No abdominal tenderness.  Neurological: He is alert.  Patient is very somnolent, wakes to voice but quickly falls asleep. Not cooperative with neuro exam. Does move extremities purposefully. No unilateral weakness noted.  Skin: Skin is warm and dry.  Psychiatric: He has a normal mood and affect.  Nursing note and vitals reviewed.  ED Course  Procedures (including critical care time) Labs Review Labs Reviewed  CBC WITH DIFFERENTIAL/PLATELET - Abnormal; Notable for the following:    WBC 2.1 (*)    RBC 3.11 (*)    Hemoglobin 9.1 (*)    HCT 26.6 (*)    Platelets 20 (*)    Neutro Abs 1.6 (*)    Lymphs Abs 0.4 (*)    All other components within normal limits  COMPREHENSIVE METABOLIC PANEL - Abnormal; Notable for the following:    CO2 14 (*)    Glucose, Bld 298 (*)    BUN 59 (*)    Creatinine, Ser 3.37 (*)    Total Protein 5.4 (*)    Albumin 3.4 (*)    GFR calc non Af Amer 18 (*)    GFR calc Af Amer 21 (*)    All other components within normal limits  AMMONIA - Abnormal; Notable for the following:    Ammonia 271 (*)    All other components within normal limits  I-STAT CG4 LACTIC ACID, ED - Abnormal; Notable for the  following:    Lactic Acid, Venous 2.53 (*)    All other components within normal limits  PATHOLOGIST SMEAR REVIEW  URINALYSIS, ROUTINE W REFLEX MICROSCOPIC (NOT AT [facility])    Imaging Review US Paracentesis  09/13/2015  INDICATION: Abdominal distention secondary to recurrent ascites. Request diagnostic and therapeutic paracentesis of up to 3 L max. EXAM: ULTRASOUND GUIDED LEFT LOWER QUADRANT PARACENTESIS MEDICATIONS: None. COMPLICATIONS: None immediate. PROCEDURE: Informed written consent was obtained from the patient after a discussion of the risks, benefits and alternatives to treatment. A timeout was performed prior to the initiation of the procedure. Initial ultrasound scanning demonstrates a large amount of ascites within the right lower abdominal quadrant. The right lower abdomen was prepped and draped in the usual sterile fashion. 1% lidocaine with epinephrine was used for local anesthesia. Following this, a 7 cm, 19 gauge, Yueh catheter was introduced. An ultrasound image was saved for documentation purposes. The paracentesis was performed. The catheter was removed and a dressing was applied. The patient tolerated the procedure well without immediate post procedural complication. FINDINGS: A total of approximately 3 L of clear, amber colored fluid was removed. Samples were sent to the laboratory as requested by the clinical team. IMPRESSION: Successful ultrasound-guided paracentesis yielding 3 liters of peritoneal fluid. Read by: Ascencion Dike PA-C Electronically Signed   By: Corrie Mckusick D.O.   On: 09/13/2015 14:28   I have personally reviewed and evaluated these images and lab results as part of my medical decision-making.   EKG Interpretation None       3:33 PM Patient seen and examined. Work-up initiated. Medications ordered.   Vital signs reviewed and are as follows: BP 94/59 mmHg  Pulse 67  Temp(Src) 97.9 F (36.6 C) (Oral)  Resp 10  SpO2 100%  5:21 PM Patient discussed with  and seen by Dr. Maryan Rued.   Will need admit for hepatic encephalopathy. I have ordered lactulose enema as patient will be unable to tolerate PO as he remains obtunded.   Will admit to hospitalist service.   5:51 PM Spoke with Dr. Grandville Silos of [place] who will see. CXR ordered.   MDM   Final diagnoses:  Acute hepatic encephalopathy ([place])   Admit.     Carlisle Cater, PA-C 09/14/15 1752  Blanchie Dessert, MD 09/15/15 [phone]

## 2015-09-14 NOTE — H&P (Signed)
[facility] History and Physical  [name] [id number] DOB: 1950-11-10 DOA: 09/14/2015  Referring physician: Carlisle Cater, ED PA PCP: [name], MD   Chief Complaint: Altered mental status  HPI: [name] is a 65 y.o. male  Unfortunate gentleman with history of nonalcoholic steatohepatitis cirrhosis, history of esophageal varices, chronic pancytopenia, chronic kidney disease stage III, type 2 diabetes, depression, protein calorie malnutrition was recently discharged from [facility] on 09/10/2015 for acute hepatic encephalopathy who presents back to the ED with altered mental status. Patient is obtunded open his eyes briefly but drifts back off to sleep and a such wife gave most of the history. Per patient's wife patient was just discharged 4 days prior to admission and seemed to be doing okay home. Patient had paracentesis done one day prior to admission with 3 L of fluid removed. Patient has been getting paracentesis approximately every 3 days. Patient's wife states that patient has been compliant with his lactulose and had been having about 3 bowel movements on a daily basis. Patient's wife stated that patient usually gets lethargic post thoracentesis. On the morning of admission patient's wife stated that she noted that patient had a bout of nonbloody emesis around 3 AM on the day of admission and noted to be more lethargic and sleepy to the point whereby he was more confused and significantly weak that he was unable to ambulate. Patient's wife states that patient remains cold and does have an electric blanket. Patient's wife denies any fevers, no cough, no chest pain, no shortness of breath, no abdominal pain, no constipation, no melena, no hematemesis, no hematochezia. Patient's wife does endorse that patient has had decreased urinary output over the past 24-48 hours. Patient was seen in the emergency room, comprehensive metabolic profile obtained had a bicarbonate  of 14 BUN of 59 creatinine of 3.37 glucose of 298 abdomen of 3.4 otherwise was within normal limits. Ammonia level was 271. Lactic acid level was 2.53. CBC had a white count of 2.1 hemoglobin of 9.1 platelet count of 20. Urinalysis pending. Chest x-ray pending. A lactulose enema was ordered. ED PA. [facility] were called to admit the patient for further evaluation and management.   Review of Systems: As per history of present illness otherwise negative. Constitutional:  No weight loss, night sweats, Fevers, chills, fatigue.  HEENT:  No headaches, Difficulty swallowing,Tooth/dental problems,Sore throat,  No sneezing, itching, ear ache, nasal congestion, post nasal drip,  Cardio-vascular:  No chest pain, Orthopnea, PND, swelling in lower extremities, anasarca, dizziness, palpitations  GI:  No heartburn, indigestion, abdominal pain, nausea, vomiting, diarrhea, change in bowel habits, loss of appetite  Resp:  No shortness of breath with exertion or at rest. No excess mucus, no productive cough, No non-productive cough, No coughing up of blood.No change in color of mucus.No wheezing.No chest wall deformity  Skin:  no rash or lesions.  GU:  no dysuria, change in color of urine, no urgency or frequency. No flank pain.  Musculoskeletal:  No joint pain or swelling. No decreased range of motion. No back pain.  Psych:  No change in mood or affect. No depression or anxiety. No memory loss.   Past Medical History  Diagnosis Date  . Ascites   . Acute hepatic encephalopathy ([place])   . Acute renal failure superimposed on stage 3 chronic kidney disease ([place])   . Anemia of chronic disease   . Bleeding esophageal varices ([place])   . Coagulopathy ([place])   . Diabetes  mellitus type II, controlled ([place])   . Depression   . Diabetic neuropathy ([place])   . Esophageal stricture   . NASH (nonalcoholic steatohepatitis)   . Protein-calorie malnutrition, severe ([place])   . Secondary biliary cirrhosis ([place])     . Thrombocytopenia ([place])   . 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 Kidney stone surgery  yrs ago  . Esophagogastroduodenoscopy  08/01/2011    Procedure: ESOPHAGOGASTRODUODENOSCOPY (EGD);  Surgeon: Scarlette Shorts, MD;  Location: [facility];  Service: Endoscopy;  Laterality: N/A;  . Esophagogastroduodenoscopy N/A 10/27/2012    Procedure: ESOPHAGOGASTRODUODENOSCOPY (EGD);  Surgeon: Inda Castle, MD;  Location: [facility];  Service: Endoscopy;  Laterality: N/A;  . Esophagogastroduodenoscopy N/A 11/22/2012    Procedure: ESOPHAGOGASTRODUODENOSCOPY (EGD);  Surgeon: Irene Shipper, MD;  Location: [facility];  Service: Endoscopy;  Laterality: N/A;  . Colonoscopy    . Upper gastrointestinal endoscopy    . Esophagogastroduodenoscopy N/A 11/29/2013    Procedure: ESOPHAGOGASTRODUODENOSCOPY (EGD);  Surgeon: Irene Shipper, MD;  Location: [facility];  Service: Endoscopy;  Laterality: N/A;  . Esophageal banding Bilateral 11/29/2013    Procedure: ESOPHAGEAL BANDING;  Surgeon: Irene Shipper, MD;  Location: [facility];  Service: Endoscopy;  Laterality: Bilateral;  . Esophagogastroduodenoscopy N/A 06/13/2014    Procedure: ESOPHAGOGASTRODUODENOSCOPY (EGD);  Surgeon: Irene Shipper, MD;  Location: [facility];  Service: Endoscopy;  Laterality: N/A;  . Esophageal banding N/A 06/13/2014    Procedure: ESOPHAGEAL BANDING;  Surgeon: Irene Shipper, MD;  Location: [facility];  Service: Endoscopy;  Laterality: N/A;   Social History:  reports that he has never smoked. He has never used smokeless tobacco. He reports that he does not drink alcohol or use illicit drugs.  No Known Allergies  Family History  Problem Relation Age of Onset  . Colon cancer Neg Hx   . Anesthesia problems Neg Hx   . Hypotension Neg Hx   . Malignant hyperthermia Neg Hx   . Pseudochol deficiency Neg Hx   . Diabetes Father   . Heart disease Father   . Aneurysm Mother   .  Heart disease Other     neice  . Diabetes      niece     Prior to Admission medications   Medication Sig Start Date End Date Taking? Authorizing Provider  acidophilus (RISAQUAD) CAPS capsule Take 2 capsules by mouth daily. 09/10/15  Yes Reyne Dumas, MD  Amino Acids-Protein Hydrolys (FEEDING SUPPLEMENT, PRO-STAT SUGAR FREE 64,) LIQD Take 30 mLs by mouth 3 (three) times daily. Patient taking differently: Take 30 mLs by mouth daily.  07/31/15  Yes Belkys A Regalado, MD  amoxicillin (AMOXIL) 500 MG capsule Take 1 capsule (500 mg total) by mouth 3 (three) times daily. 08/13/15  Yes Truman Hayward, MD  baclofen (LIORESAL) 20 MG tablet Take 1 tablet (20 mg total) by mouth at bedtime. 06/11/15  Yes Meredith Staggers, MD  bethanechol (URECHOLINE) 50 MG tablet Take 1 tablet by mouth 2 (two) times daily.  12/19/14  Yes Historical Provider, MD  FLUoxetine (PROZAC) 10 MG tablet Take 10 mg by mouth every morning.    Yes Historical Provider, MD  gabapentin (NEURONTIN) 100 MG capsule Take 100 mg by mouth 3 (three) times daily.    Yes Historical Provider, MD  Insulin Degludec (TRESIBA FLEXTOUCH) 200 UNIT/ML SOPN Inject 20 Units into the skin daily. Patient taking differently: Inject 40 Units into the skin daily.  07/31/15  Yes Belkys A Regalado, MD  lactulose (CHRONULAC) 10 GM/15ML solution Take 45 mLs (30 g total) by mouth 3 (three) times daily. 09/10/15  Yes Reyne Dumas, MD  Multiple Vitamin (MULTIVITAMIN WITH MINERALS) TABS tablet Take 1 tablet by mouth daily.   Yes Historical Provider, MD  pantoprazole (PROTONIX) 40 MG tablet TAKE 1 TABLET (40 MG TOTAL) BY MOUTH DAILY. 11/13/14  Yes Irene Shipper, MD  tamsulosin (FLOMAX) 0.4 MG CAPS capsule Take 1 capsule by mouth 2 (two) times daily.  12/19/14  Yes Historical Provider, MD  Vitamin D, Ergocalciferol, (DRISDOL) 50000 UNITS CAPS capsule Take 50,000 Units by mouth every 7 (seven) days. Mondays. 03/29/15  Yes Historical Provider, MD  XIFAXAN 550 MG TABS tablet TAKE 1  TABLET (550 MG TOTAL) BY MOUTH 2 (TWO) TIMES DAILY. 08/27/15  Yes Irene Shipper, MD   Physical Exam: Filed Vitals:   09/14/15 1512 09/14/15 1530 09/14/15 1630 09/14/15 1730  BP: 94/59 94/67 108/79 109/70  Pulse: 67     Temp: 97.9 F (36.6 C)     TempSrc: Oral     Resp: 10 13 11 11   SpO2: 100%       Wt Readings from Last 3 Encounters:  09/08/15 83.915 kg (185 lb)  08/28/15 84.7 kg (186 lb 11.7 oz)  08/22/15 87.726 kg (193 lb 6.4 oz)    General:  Frail elderly gentleman lethargic laying on gurney seems to be protecting her airway and no acute cardiopulmonary distress. Opens eyes briefly but then drifts back off to sleep.  Eyes: PERRLA, EOMI, normal lids, irises & conjunctiva ENT: grossly normal hearing, lips & tongue. Dry mucous membranes. Neck: no LAD, masses or thyromegaly Cardiovascular: RRR, no m/r/g. No LE edema. Respiratory: CTA bilaterally anterior lung fields, no w/r/r. Normal respiratory effort. Abdomen: soft, distended, nontender to palpation, positive bowel sounds, positive fluid wave. Skin: no rash or induration seen on limited exam Musculoskeletal: grossly normal tone BUE/BLE Psychiatric: Unable to assess due to mental status. Neurologic: Unable to assess due to mental status.           Labs on Admission:  Basic Metabolic Panel:  Recent Labs Lab 09/08/15 0356 09/09/15 0600 09/10/15 0535 09/14/15 1604  NA 131* 134* 134* 135  K 5.2* 4.7 4.3 3.8  CL 108 106 106 109  CO2 17* 19* 18* 14*  GLUCOSE 187* 208* 304* 298*  BUN 60* 54* 52* 59*  CREATININE 2.43* 2.30* 2.34* 3.37*  CALCIUM 9.2 9.4 9.1 9.0   Liver Function Tests:  Recent Labs Lab 09/08/15 0356 09/09/15 0600 09/10/15 0535 09/14/15 1604  AST 27 26 25 25   ALT 26 23 22 18   ALKPHOS 99 90 104 75  BILITOT 1.8* 1.4* 1.1 0.9  PROT 5.5* 5.4* 5.4* 5.4*  ALBUMIN 3.0* 2.9* 3.0* 3.4*   No results for input(s): LIPASE, AMYLASE in the last 168 hours.  Recent Labs Lab 09/08/15 0356 09/09/15 0600  09/10/15 0535 09/14/15 1605  AMMONIA 56* 56* 74* 271*   CBC:  Recent Labs Lab 09/08/15 0356 09/09/15 0600 09/10/15 0535 09/14/15 1604  WBC 3.0* 2.7* 3.1* 2.1*  NEUTROABS  --   --   --  1.6*  HGB 9.9* 10.0* 10.1* 9.1*  HCT 27.9* 29.2* 28.7* 26.6*  MCV 87.5 86.9 87.2 85.5  PLT 19* 18* 20* 20*   Cardiac Enzymes: No results for input(s): CKTOTAL, CKMB, CKMBINDEX, TROPONINI in the last 168 hours.  BNP (last 3 results)  Recent Labs  07/25/15 1144  BNP  75.7    ProBNP (last 3 results) No results for input(s): PROBNP in the last 8760 hours.  CBG:  Recent Labs Lab 09/09/15 1704 09/09/15 2100 09/10/15 0755 09/10/15 1201 09/10/15 1725  GLUCAP 260* 242* 242* 282* 318*    Radiological Exams on Admission: US Paracentesis  09/13/2015  INDICATION: Abdominal distention secondary to recurrent ascites. Request diagnostic and therapeutic paracentesis of up to 3 L max. EXAM: ULTRASOUND GUIDED LEFT LOWER QUADRANT PARACENTESIS MEDICATIONS: None. COMPLICATIONS: None immediate. PROCEDURE: Informed written consent was obtained from the patient after a discussion of the risks, benefits and alternatives to treatment. A timeout was performed prior to the initiation of the procedure. Initial ultrasound scanning demonstrates a large amount of ascites within the right lower abdominal quadrant. The right lower abdomen was prepped and draped in the usual sterile fashion. 1% lidocaine with epinephrine was used for local anesthesia. Following this, a 7 cm, 19 gauge, Yueh catheter was introduced. An ultrasound image was saved for documentation purposes. The paracentesis was performed. The catheter was removed and a dressing was applied. The patient tolerated the procedure well without immediate post procedural complication. FINDINGS: A total of approximately 3 L of clear, amber colored fluid was removed. Samples were sent to the laboratory as requested by the clinical team. IMPRESSION: Successful  ultrasound-guided paracentesis yielding 3 liters of peritoneal fluid. Read by: Ascencion Dike PA-C Electronically Signed   By: Corrie Mckusick D.O.   On: 09/13/2015 14:28    EKG: None  Assessment/Plan Principal Problem:   Hepatic encephalopathy (HCC) Active Problems:   Acute hepatic encephalopathy (HCC)   Thrombocytopenia (HCC)   Ascites   Acute renal failure superimposed on stage 3 chronic kidney disease (HCC)   Anemia of chronic disease   Depression   Controlled type 2 diabetes mellitus with diabetic nephropathy, without long-term current use of insulin (HCC)   Diabetic neuropathy (HCC)   Coagulopathy (HCC)   NASH (nonalcoholic steatohepatitis)   Protein-calorie malnutrition, severe   Secondary biliary cirrhosis (HCC)   Portal hypertension ([place])   Type 2 diabetes mellitus (HCC)   Pancytopenia (HCC)   Acidosis, metabolic  #1 hepatic encephalopathy Patient presented with lethargy/obtundation with a history of [place] cirrhosis, pancytopenia, history of recent admissions for hepatic encephalopathy. Patient noted to have an ammonia level of 271. Patient also noted to be in acute on chronic kidney disease stage III. Patient currently afebrile. Patient with a chronic neutropenia. Will admit patient to stepdown for closer monitoring. Place on lactulose enemas 3 times daily. Once mental status improves were transitioned to oral lactulose and resume Xifaxan and other home regimen.  #2 acute on chronic kidney disease stage III Patient with worsening renal function. Concern for prerenal azotemia as patient looks clinically dehydrated had some emesis and decreased urine output. Concern also for hepatorenal syndrome. Check a UA with cultures and sensitivities. Check a fractional excretion of sodium. Place on gentle hydration. If worsening renal function we'll get a renal ultrasound and consult with nephrology. Follow.  #3 acidosis Likely secondary to problem #2. Place on a bicarbonate drip.  Follow.  #4 pancytopenia Chronic in nature. Likely secondary to cirrhosis. Patient with no overt bleeding. Follow closely.  #5 advanced cirrhosis secondary to [place]/recurrent ascites Patient with a meld score of 21. Patient has follow-up appointment at [facility] hepatology 09/19/2015 to be considered for transplant. Patient with repeated/scheduled paracentesis every 3 days at [facility]. Patient with last paracentesis 09/13/2015 with 3 L of fluid removed per wife with no evidence  of SBP. Patient was administered abdomen after procedure. Patient not a candidate for Pleurx catheter as patient is being considered for transplant. Will monitor closely. Follow. Patient will likely need repeat paracentesis during this hospitalization.  #6 esophageal varices Stable. No bleeding. Monitor closely.  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 Patient currently obtunded and as such we'll place on IV ampicillin at this time. Once obtundation has improved to resume oral suppressive antibiotics.  #8 diabetes mellitus type 2 Sliding scale insulin.  #9 gastroesophageal reflux disease PPI.  #10 BPH Medications on hold until mental status improves.  #11 severe protein calorie malnutrition  #12 depression Hold medications until mental status has improved.  #13 prophylaxis  PPI for GI prophylaxis. SCDs for DVT prophylaxis.   Code Status: DNR DVT Prophylaxis:SCD Family Communication: Updated wife at bedside. Disposition Plan: Admit to SDU  Time spent: 6 mins  [facility] MD [facility] Pager [phone]

## 2015-09-14 NOTE — ED Notes (Addendum)
Per [facility] patient presents with altered mental status.  Per the family patient was treated a week ago for elevated ammonia and had similar symptoms then.  Patient is very lethargic.  Opens eyes to stimulation but does not follow commands.  Respiratory status does not appear compromised.  Family states patient is normally alert and able to ambulate with a walker.

## 2015-09-15 ENCOUNTER — Other Ambulatory Visit: Payer: Self-pay | Admitting: Internal Medicine

## 2015-09-15 LAB — CBC WITH DIFFERENTIAL/PLATELET
BASOS ABS: 0 10*3/uL (ref 0.0–0.1)
Basophils Relative: 0 %
EOS ABS: 0.1 10*3/uL (ref 0.0–0.7)
EOS PCT: 2 %
HCT: 26.4 % — ABNORMAL LOW (ref 39.0–52.0)
Hemoglobin: 9.1 g/dL — ABNORMAL LOW (ref 13.0–17.0)
LYMPHS PCT: 15 %
Lymphs Abs: 0.4 10*3/uL — ABNORMAL LOW (ref 0.7–4.0)
MCH: 29.4 pg (ref 26.0–34.0)
MCHC: 34.5 g/dL (ref 30.0–36.0)
MCV: 85.2 fL (ref 78.0–100.0)
Monocytes Absolute: 0.1 10*3/uL (ref 0.1–1.0)
Monocytes Relative: 4 %
NEUTROS PCT: 80 %
Neutro Abs: 2.3 10*3/uL (ref 1.7–7.7)
PLATELETS: 21 10*3/uL — AB (ref 150–400)
RBC: 3.1 MIL/uL — AB (ref 4.22–5.81)
RDW: 15.3 % (ref 11.5–15.5)
WBC: 2.9 10*3/uL — AB (ref 4.0–10.5)

## 2015-09-15 LAB — GLUCOSE, CAPILLARY
GLUCOSE-CAPILLARY: 109 mg/dL — AB (ref 65–99)
GLUCOSE-CAPILLARY: 161 mg/dL — AB (ref 65–99)
GLUCOSE-CAPILLARY: 186 mg/dL — AB (ref 65–99)
Glucose-Capillary: 144 mg/dL — ABNORMAL HIGH (ref 65–99)
Glucose-Capillary: 82 mg/dL (ref 65–99)

## 2015-09-15 LAB — BASIC METABOLIC PANEL
Anion gap: 13 (ref 5–15)
BUN: 61 mg/dL — AB (ref 6–20)
CHLORIDE: 107 mmol/L (ref 101–111)
CO2: 21 mmol/L — AB (ref 22–32)
CREATININE: 3.54 mg/dL — AB (ref 0.61–1.24)
Calcium: 8.9 mg/dL (ref 8.9–10.3)
GFR calc Af Amer: 20 mL/min — ABNORMAL LOW (ref >60)
GFR calc non Af Amer: 17 mL/min — ABNORMAL LOW (ref >60)
Glucose, Bld: 105 mg/dL — ABNORMAL HIGH (ref 65–99)
Potassium: 3.4 mmol/L — ABNORMAL LOW (ref 3.5–5.1)
Sodium: 141 mmol/L (ref 135–145)

## 2015-09-15 LAB — URINE MICROSCOPIC-ADD ON
Bacteria, UA: NONE SEEN
SQUAMOUS EPITHELIAL / LPF: NONE SEEN

## 2015-09-15 LAB — URINALYSIS, ROUTINE W REFLEX MICROSCOPIC
BILIRUBIN URINE: NEGATIVE
Glucose, UA: NEGATIVE mg/dL
KETONES UR: NEGATIVE mg/dL
Leukocytes, UA: NEGATIVE
NITRITE: NEGATIVE
PH: 6 (ref 5.0–8.0)
Protein, ur: NEGATIVE mg/dL
Specific Gravity, Urine: 1.022 (ref 1.005–1.030)

## 2015-09-15 LAB — LACTIC ACID, PLASMA: LACTIC ACID, VENOUS: 2 mmol/L (ref 0.5–2.0)

## 2015-09-15 LAB — COMPREHENSIVE METABOLIC PANEL
ALBUMIN: 3.2 g/dL — AB (ref 3.5–5.0)
ALT: 19 U/L (ref 17–63)
ANION GAP: 10 (ref 5–15)
AST: 27 U/L (ref 15–41)
Alkaline Phosphatase: 66 U/L (ref 38–126)
BILIRUBIN TOTAL: 1.4 mg/dL — AB (ref 0.3–1.2)
BUN: 60 mg/dL — ABNORMAL HIGH (ref 6–20)
CHLORIDE: 110 mmol/L (ref 101–111)
CO2: 18 mmol/L — ABNORMAL LOW (ref 22–32)
Calcium: 8.9 mg/dL (ref 8.9–10.3)
Creatinine, Ser: 3.52 mg/dL — ABNORMAL HIGH (ref 0.61–1.24)
GFR calc Af Amer: 20 mL/min — ABNORMAL LOW (ref >60)
GFR calc non Af Amer: 17 mL/min — ABNORMAL LOW (ref >60)
GLUCOSE: 190 mg/dL — AB (ref 65–99)
POTASSIUM: 3.6 mmol/L (ref 3.5–5.1)
SODIUM: 138 mmol/L (ref 135–145)
TOTAL PROTEIN: 5.2 g/dL — AB (ref 6.5–8.1)

## 2015-09-15 LAB — SODIUM, URINE, RANDOM

## 2015-09-15 LAB — PROTIME-INR
INR: 1.88 — ABNORMAL HIGH (ref 0.00–1.49)
Prothrombin Time: 21.6 seconds — ABNORMAL HIGH (ref 11.6–15.2)

## 2015-09-15 LAB — APTT: APTT: 39 s — AB (ref 24–37)

## 2015-09-15 LAB — AMMONIA: AMMONIA: 154 umol/L — AB (ref 9–35)

## 2015-09-15 LAB — CREATININE, URINE, RANDOM: CREATININE, URINE: 231.08 mg/dL

## 2015-09-15 MED ORDER — LACTULOSE ENEMA
300.0000 mL | Freq: Four times a day (QID) | ORAL | Status: DC
  Administered 2015-09-15 – 2015-09-17 (×6): 300 mL via RECTAL
  Filled 2015-09-15 (×8): qty 300

## 2015-09-15 MED ORDER — SODIUM CHLORIDE 0.9 % IV BOLUS (SEPSIS)
1000.0000 mL | Freq: Once | INTRAVENOUS | Status: AC
  Administered 2015-09-15: 1000 mL via INTRAVENOUS

## 2015-09-15 MED ORDER — SODIUM CHLORIDE 0.9% FLUSH
10.0000 mL | Freq: Two times a day (BID) | INTRAVENOUS | Status: DC
  Administered 2015-09-15 – 2015-09-16 (×3): 10 mL
  Administered 2015-09-16: 40 mL
  Administered 2015-09-17 – 2015-09-18 (×4): 10 mL

## 2015-09-15 MED ORDER — ALBUMIN HUMAN 25 % IV SOLN
25.0000 g | Freq: Four times a day (QID) | INTRAVENOUS | Status: AC
  Administered 2015-09-15 – 2015-09-17 (×10): 25 g via INTRAVENOUS
  Filled 2015-09-15 (×5): qty 100
  Filled 2015-09-15: qty 50
  Filled 2015-09-15: qty 100
  Filled 2015-09-15: qty 50
  Filled 2015-09-15: qty 100
  Filled 2015-09-15 (×3): qty 50
  Filled 2015-09-15: qty 100

## 2015-09-15 MED ORDER — CHLORHEXIDINE GLUCONATE 0.12 % MT SOLN
15.0000 mL | Freq: Two times a day (BID) | OROMUCOSAL | Status: DC
  Administered 2015-09-15 – 2015-09-20 (×12): 15 mL via OROMUCOSAL
  Filled 2015-09-15 (×4): qty 15

## 2015-09-15 MED ORDER — CETYLPYRIDINIUM CHLORIDE 0.05 % MT LIQD
7.0000 mL | Freq: Two times a day (BID) | OROMUCOSAL | Status: DC
  Administered 2015-09-15 – 2015-09-20 (×12): 7 mL via OROMUCOSAL

## 2015-09-15 MED ORDER — POTASSIUM CHLORIDE 10 MEQ/50ML IV SOLN
10.0000 meq | INTRAVENOUS | Status: AC
  Administered 2015-09-16 (×2): 10 meq via INTRAVENOUS
  Filled 2015-09-15 (×2): qty 50

## 2015-09-15 MED ORDER — LACTULOSE ENEMA
300.0000 mL | Freq: Four times a day (QID) | ORAL | Status: DC
  Administered 2015-09-15 (×2): 300 mL via RECTAL
  Filled 2015-09-15 (×4): qty 300

## 2015-09-15 MED ORDER — SODIUM CHLORIDE 0.9% FLUSH
10.0000 mL | INTRAVENOUS | Status: DC | PRN
Start: 2015-09-15 — End: 2015-09-19

## 2015-09-15 MED ORDER — DEXTROSE 5 % IV SOLN
0.0000 ug/min | INTRAVENOUS | Status: DC
  Administered 2015-09-15: 2 ug/min via INTRAVENOUS
  Administered 2015-09-16 – 2015-09-17 (×3): 9 ug/min via INTRAVENOUS
  Filled 2015-09-15 (×5): qty 4

## 2015-09-15 NOTE — Consult Note (Signed)
Renal Service Consult Note [facility]  [name] 09/15/2015 [place] Requesting Physician:  Dr. Grandville Silos  Reason for Consult:  Acute on CRF HPI: The patient is a 66 y.o. year-old with hx of NASH and cirrhosis.  Presenting with altered mental status, very high NH3 level and worsening renal failure.  Asked to see of a/c renal failure.   Renal function started to decline last year July 2016 with creat up to 1.5 range.  In late 2016 creat was up to 2.0, and more recently in Feb 2017 creat was 2.0- 2.5.  Now admitted after several recent paracenteses done for abd pain/ swelling.  Creat 3.3 on admit yest and today is up to 3.52.  350 cc UOP since admission. Getting IVF's, serum CO2 18, K normal. BP's [id number] systolic.    Patient provides no history.  There is a chance that [facility] will take him for transplant consideration but not sure.  Getting lactulose enema and has had some improvement in mental status.    Past Medical History  Past Medical History  Diagnosis Date  . Ascites   . Acute hepatic encephalopathy ([place])   . Acute renal failure superimposed on stage 3 chronic kidney disease ([place])   . Anemia of chronic disease   . Bleeding esophageal varices ([place])   . Coagulopathy ([place])   . Diabetes mellitus type II, controlled ([place])   . Depression   . Diabetic neuropathy ([place])   . Esophageal stricture   . NASH (nonalcoholic steatohepatitis)   . Protein-calorie malnutrition, severe ([place])   . Secondary biliary cirrhosis ([place])   . Thrombocytopenia ([place])   . Abscess in epidural space of cervical spine 08/13/2015   Past Surgical History  Past Surgical History  Procedure Laterality Date  . Kidney stone surgery  yrs ago  . Esophagogastroduodenoscopy  08/01/2011    Procedure: ESOPHAGOGASTRODUODENOSCOPY (EGD);  Surgeon: Scarlette Shorts, MD;  Location: [facility];  Service: Endoscopy;  Laterality: N/A;  . Esophagogastroduodenoscopy N/A 10/27/2012    Procedure:  ESOPHAGOGASTRODUODENOSCOPY (EGD);  Surgeon: Inda Castle, MD;  Location: [facility];  Service: Endoscopy;  Laterality: N/A;  . Esophagogastroduodenoscopy N/A 11/22/2012    Procedure: ESOPHAGOGASTRODUODENOSCOPY (EGD);  Surgeon: Irene Shipper, MD;  Location: [facility];  Service: Endoscopy;  Laterality: N/A;  . Colonoscopy    . Upper gastrointestinal endoscopy    . Esophagogastroduodenoscopy N/A 11/29/2013    Procedure: ESOPHAGOGASTRODUODENOSCOPY (EGD);  Surgeon: Irene Shipper, MD;  Location: [facility];  Service: Endoscopy;  Laterality: N/A;  . Esophageal banding Bilateral 11/29/2013    Procedure: ESOPHAGEAL BANDING;  Surgeon: Irene Shipper, MD;  Location: [facility];  Service: Endoscopy;  Laterality: Bilateral;  . Esophagogastroduodenoscopy N/A 06/13/2014    Procedure: ESOPHAGOGASTRODUODENOSCOPY (EGD);  Surgeon: Irene Shipper, MD;  Location: [facility];  Service: Endoscopy;  Laterality: N/A;  . Esophageal banding N/A 06/13/2014    Procedure: ESOPHAGEAL BANDING;  Surgeon: Irene Shipper, MD;  Location: [facility];  Service: Endoscopy;  Laterality: N/A;   Family History  Family History  Problem Relation Age of Onset  . Colon cancer Neg Hx   . Anesthesia problems Neg Hx   . Hypotension Neg Hx   . Malignant hyperthermia Neg Hx   . Pseudochol deficiency Neg Hx   . Diabetes Father   . Heart disease Father   . Aneurysm Mother   . Heart disease Other     neice  . Diabetes      niece   Social  History  reports that he has never smoked. He has never used smokeless tobacco. He reports that he does not drink alcohol or use illicit drugs. Allergies No Known Allergies Home medications Prior to Admission medications   Medication Sig Start Date End Date Taking? Authorizing Provider  acidophilus (RISAQUAD) CAPS capsule Take 2 capsules by mouth daily. 09/10/15  Yes Reyne Dumas, MD  Amino Acids-Protein Hydrolys (FEEDING SUPPLEMENT, PRO-STAT SUGAR FREE 64,) LIQD Take 30 mLs by mouth 3 (three) times  daily. Patient taking differently: Take 30 mLs by mouth daily.  07/31/15  Yes Belkys A Regalado, MD  amoxicillin (AMOXIL) 500 MG capsule Take 1 capsule (500 mg total) by mouth 3 (three) times daily. 08/13/15  Yes Truman Hayward, MD  baclofen (LIORESAL) 20 MG tablet Take 1 tablet (20 mg total) by mouth at bedtime. 06/11/15  Yes Meredith Staggers, MD  bethanechol (URECHOLINE) 50 MG tablet Take 1 tablet by mouth 2 (two) times daily.  12/19/14  Yes Historical Provider, MD  FLUoxetine (PROZAC) 10 MG tablet Take 10 mg by mouth every morning.    Yes Historical Provider, MD  gabapentin (NEURONTIN) 100 MG capsule Take 100 mg by mouth 3 (three) times daily.    Yes Historical Provider, MD  Insulin Degludec (TRESIBA FLEXTOUCH) 200 UNIT/ML SOPN Inject 20 Units into the skin daily. Patient taking differently: Inject 40 Units into the skin daily.  07/31/15  Yes Belkys A Regalado, MD  lactulose (CHRONULAC) 10 GM/15ML solution Take 45 mLs (30 g total) by mouth 3 (three) times daily. 09/10/15  Yes Reyne Dumas, MD  Multiple Vitamin (MULTIVITAMIN WITH MINERALS) TABS tablet Take 1 tablet by mouth daily.   Yes Historical Provider, MD  pantoprazole (PROTONIX) 40 MG tablet TAKE 1 TABLET (40 MG TOTAL) BY MOUTH DAILY. 11/13/14  Yes Irene Shipper, MD  tamsulosin (FLOMAX) 0.4 MG CAPS capsule Take 1 capsule by mouth 2 (two) times daily.  12/19/14  Yes Historical Provider, MD  Vitamin D, Ergocalciferol, (DRISDOL) 50000 UNITS CAPS capsule Take 50,000 Units by mouth every 7 (seven) days. Mondays. 03/29/15  Yes Historical Provider, MD  XIFAXAN 550 MG TABS tablet TAKE 1 TABLET (550 MG TOTAL) BY MOUTH 2 (TWO) TIMES DAILY. 08/27/15  Yes Irene Shipper, MD   Liver Function Tests  Recent Labs Lab 09/10/15 0535 09/14/15 1604 09/15/15 0511  AST 25 25 27   ALT 22 18 19   ALKPHOS 104 75 66  BILITOT 1.1 0.9 1.4*  PROT 5.4* 5.4* 5.2*  ALBUMIN 3.0* 3.4* 3.2*   No results for input(s): LIPASE, AMYLASE in the last 168 hours. CBC  Recent  Labs Lab 09/10/15 0535 09/14/15 1604 09/15/15 0511  WBC 3.1* 2.1* 2.9*  NEUTROABS  --  1.6* 2.3  HGB 10.1* 9.1* 9.1*  HCT 28.7* 26.6* 26.4*  MCV 87.2 85.5 85.2  PLT 20* 20* 21*   Basic Metabolic Panel  Recent Labs Lab 09/09/15 0600 09/10/15 0535 09/14/15 1604 09/15/15 0511  NA 134* 134* 135 138  K 4.7 4.3 3.8 3.6  CL 106 106 109 110  CO2 19* 18* 14* 18*  GLUCOSE 208* 304* 298* 190*  BUN 54* 52* 59* 60*  CREATININE 2.30* 2.34* 3.37* 3.52*  CALCIUM 9.4 9.1 9.0 8.9    Filed Vitals:   09/15/15 0800 09/15/15 1000 09/15/15 1219 09/15/15 1300  BP: 109/59 97/59 111/64 119/67  Pulse: 79 75 86 80  Temp:   97.7 F (36.5 C)   TempSrc:   Oral   Resp: 13 9  16 13  Height:      Weight:      SpO2: 98% 98% 98% 100%   Exam Stuporous, minimally responsive No rash, cyanosis or gangrene Sclera anicteric, throat clear No jvd, flat neck veins Chest clear bilat RRR no mrg Abd moderate-severe ascites, no hsm, +bs GU foley in place Ext no LE or UE edema Neuro as above, stuporous minimal verbal response  UNa <210 UCreat 218 Na 138  K 3.6  Creat 3.52  BUN 60  CO2 18 Alb 3.2  tbili 1.4  Glu 190 WBC 2.9  Hb 9.1  plt 21k ECHO normal LV // CXR no edema  Assessment: 1 Acute on CKD4  - likely hepatorenal, vol depletion also possible given exam 2 CKD4 - baseline creat 2.0- 2.5 3 Cirrhosis/ NASH 4 Hepatic encephalopathy 5 Anemia 6 Thrombocytopenia   Plan - cont bicarb gtt at 100/hr.  If not transferred to [facility] today would start him on levophed gtt with goal to keep SBP > 110.  He has considerable CKD , this may decrease the likelihood of his recovering renal function in the setting of HRS. Will follow. Not a dialysis candidate in this setting.   Kelly Splinter MD [facility] pager [phone]    cell [phone] 09/15/2015, 3:57 PM

## 2015-09-15 NOTE — Progress Notes (Signed)
PT Cancellation Note  Patient Details Name: [name] MRN: [id number] DOB: March 03, 1951   Cancelled Treatment:    Reason Eval/Treat Not Completed: Patient not medically ready; currently non responsive per RN.  Will attempt another day.   Reginia Naas 09/15/2015, 2:58 PM  Magda Kiel, [place] 09/15/2015

## 2015-09-15 NOTE — Progress Notes (Signed)
Peripherally Inserted Central Catheter/Midline Placement  The IV Nurse has discussed with the patient and/or persons authorized to consent for the patient, the purpose of this procedure and the potential benefits and risks involved with this procedure.  The benefits include less needle sticks, lab draws from the catheter and patient may be discharged home with the catheter.  Risks include, but not limited to, infection, bleeding, blood clot (thrombus formation), and puncture of an artery; nerve damage and irregular heat beat.  Alternatives to this procedure were also discussed.  Consent signed by wife.  PICC/Midline Placement Documentation  PICC Triple Lumen 09/15/15 PICC Right Brachial 39 cm 0 cm (Active)  Indication for Insertion or Continuance of Line Administration of hyperosmolar/irritating solutions (i.e. TPN, Vancomycin, etc.) 09/15/2015 11:25 AM  Exposed Catheter (cm) 0 cm 09/15/2015 11:25 AM  Site Assessment Clean;Dry;Intact 09/15/2015 11:25 AM  Lumen #1 Status Flushed;Saline locked;Blood return noted 09/15/2015 11:25 AM  Lumen #2 Status Flushed;Saline locked;Blood return noted 09/15/2015 11:25 AM  Lumen #3 Status Flushed;Saline locked;Blood return noted 09/15/2015 11:25 AM  Dressing Type Transparent 09/15/2015 11:25 AM  Dressing Change Due 09/22/15 09/15/2015 11:25 AM       Gordan Payment 09/15/2015, 11:28 AM

## 2015-09-15 NOTE — Progress Notes (Signed)
[facility] PROGRESS NOTE  [name] [id number] DOB: Aug 28, 1950 DOA: 09/14/2015 PCP: [name], MD  Assessment/Plan: #1 acute hepatic encephalopathy Patient still lethargic. Ammonia levels trending down and now in the 154 from 271. Continue lactulose enemas every 6 hours. Once patient is more alert and able to tolerate oral intake will + Faxon. Follow.  #2 acute on chronic kidney disease stage III  Acute on chronic kidney disease stage III likely prerenal azotemia versus hepatorenal syndrome. Urinalysis pending. Urine electrolytes pending. Place a Foley catheter. Give a bolus of normal saline. Increase bicarbonate drip 200 mL per hour. Place on albumin 25 mg every 6 hours. Place a PICC line. Will consider octreotide and levophed drip if able to get a PICC line placed and pending renal evaluation. Consult with nephrology for further evaluation and management.  #3 acidosis Likely secondary to problem #2. Continue bicarbonate drip.  #4 pancytopenia Chronic in nature. Secondary to cirrhosis. No overt bleeding. Follow.  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 Patient has a follow-up appointment at [facility] on 09/19/2015 to be considered for transplant. Patient would repeated paracenteses every 3 days at [facility]. Last paracentesis 09/13/2015 with 3 L of fluid removed per wife with no evidence of SBP. Patient received albumin postprocedure. Patient may need thoracentesis during this hospitalization however will monitor for now. Will try to speak with [facility] to see patient can be transferred to the facility as patient is supposed to be evaluated for transplant on 09/19/2015.  #6 esophageal varices Stable. No bleeding. Monitor closely.  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 Patient currently  obtunded, and continue on IV ampicillin at this time. Once obtundation has improved to resume oral suppressive antibiotics.  #8 diabetes mellitus type 2 Sliding scale insulin.  #9 gastroesophageal reflux disease PPI.  #10 BPH Once more alert will resume home regimen.  #11 severe protein calorie malnutrition  #12 depression Once alert will resume home regimen.  #13 prophylaxis PPI for GI prophylaxis. SCDs for DVT prophylaxis.  Code Status: DO NOT RESUSCITATE Family Communication: Updated wife at bedside. Disposition Plan: Remain in the step down unit.   Consultants:  None.  Procedures:  CXR   Antibiotics:  IV Ampicillin 09/14/2015  HPI/Subjective: Patient lethargic, opens eyes and drifts back to sleep.   Objective: Filed Vitals:   09/15/15 0700 09/15/15 0743  BP: 94/63 98/59  Pulse: 83 78  Temp:  98.4 F (36.9 C)  Resp: 11 12    Intake/Output Summary (Last 24 hours) at 09/15/15 1023 Last data filed at 09/15/15 0700  Gross per 24 hour  Intake 1008.75 ml  Output    375 ml  Net 633.75 ml   Filed Weights   09/14/15 2026 09/15/15 0500  Weight: 77.6 kg (171 lb 1.2 oz) 77.6 kg (171 lb 1.2 oz)    Exam:   General:  Obtunded. Opens eyes however drifts back to sleep. Protecting airway.  Cardiovascular: Regular rate and rhythm  Respiratory: Clear to auscultation bilaterally anterior lung fields.  Abdomen: Soft/NT/Mildly distended/+BS  Musculoskeletal: No c/c/e  Data Reviewed: Basic Metabolic Panel:  Recent Labs Lab 09/09/15 0600 09/10/15 0535 09/14/15 1604 09/14/15 2102 09/15/15 0511  NA 134* 134* 135  --  138  K 4.7 4.3 3.8  --  3.6  CL 106 106 109  --  110  CO2 19*  18* 14*  --  18*  GLUCOSE 208* 304* 298*  --  190*  BUN 54* 52* 59*  --  60*  CREATININE 2.30* 2.34* 3.37*  --  3.52*  CALCIUM 9.4 9.1 9.0  --  8.9  MG  --   --   --  2.8*  --    Liver Function Tests:  Recent Labs Lab 09/09/15 0600 09/10/15 0535 09/14/15 1604  09/15/15 0511  AST 26 25 25 27   ALT 23 22 18 19   ALKPHOS 90 104 75 66  BILITOT 1.4* 1.1 0.9 1.4*  PROT 5.4* 5.4* 5.4* 5.2*  ALBUMIN 2.9* 3.0* 3.4* 3.2*   No results for input(s): LIPASE, AMYLASE in the last 168 hours.  Recent Labs Lab 09/09/15 0600 09/10/15 0535 09/14/15 1605 09/15/15 0511  AMMONIA 56* 74* 271* 154*   CBC:  Recent Labs Lab 09/09/15 0600 09/10/15 0535 09/14/15 1604 09/15/15 0511  WBC 2.7* 3.1* 2.1* 2.9*  NEUTROABS  --   --  1.6* 2.3  HGB 10.0* 10.1* 9.1* 9.1*  HCT 29.2* 28.7* 26.6* 26.4*  MCV 86.9 87.2 85.5 85.2  PLT 18* 20* 20* 21*   Cardiac Enzymes: No results for input(s): CKTOTAL, CKMB, CKMBINDEX, TROPONINI in the last 168 hours. BNP (last 3 results)  Recent Labs  07/25/15 1144  BNP 75.7    ProBNP (last 3 results) No results for input(s): PROBNP in the last 8760 hours.  CBG:  Recent Labs Lab 09/10/15 1201 09/10/15 1725 09/15/15 0058 09/15/15 0610 09/15/15 0751  GLUCAP 282* 318* 186* 161* 144*    Recent Results (from the past 240 hour(s))  Culture, blood (routine x 2)     Status: None   Collection Time: 09/06/15  7:20 PM  Result Value Ref Range Status   Specimen Description BLOOD LEFT ARM  Final   Special Requests BOTTLES DRAWN AEROBIC AND ANAEROBIC 5CC  Final   Culture NO GROWTH 5 DAYS  Final   Report Status 09/11/2015 FINAL  Final  Culture, blood (routine x 2)     Status: None   Collection Time: 09/06/15  7:25 PM  Result Value Ref Range Status   Specimen Description BLOOD LEFT ARM  Final   Special Requests BOTTLES DRAWN AEROBIC AND ANAEROBIC 5CC  Final   Culture NO GROWTH 5 DAYS  Final   Report Status 09/11/2015 FINAL  Final  MRSA PCR Screening     Status: None   Collection Time: 09/06/15 11:36 PM  Result Value Ref Range Status   MRSA by PCR NEGATIVE NEGATIVE Final    Comment:        The GeneXpert MRSA Assay (FDA approved for NASAL specimens only), is one component of a comprehensive MRSA colonization surveillance  program. It is not intended to diagnose MRSA infection nor to guide or monitor treatment for MRSA infections.   Culture, body fluid-bottle     Status: None   Collection Time: 09/07/15  9:45 AM  Result Value Ref Range Status   Specimen Description FLUID PERITONEAL  Final   Special Requests NONE  Final   Culture NO GROWTH 5 DAYS  Final   Report Status 09/12/2015 FINAL  Final  Gram stain     Status: None   Collection Time: 09/07/15  9:45 AM  Result Value Ref Range Status   Specimen Description FLUID PERITONEAL  Final   Special Requests NONE  Final   Gram Stain   Final    DEGENERATED CELLULAR MATERIAL PRESENT RBC PRESENT NO ORGANISMS SEEN  Report Status 09/07/2015 FINAL  Final  Culture, body fluid-bottle     Status: None   Collection Time: 09/09/15 12:54 PM  Result Value Ref Range Status   Specimen Description PERITONEAL ABDOMEN  Final   Special Requests NONE  Final   Culture NO GROWTH 5 DAYS  Final   Report Status 09/14/2015 FINAL  Final  Gram stain     Status: None   Collection Time: 09/09/15 12:54 PM  Result Value Ref Range Status   Specimen Description PERITONEAL ABDOMEN  Final   Special Requests NONE  Final   Gram Stain   Final    RARE WBC PRESENT, PREDOMINANTLY MONONUCLEAR NO ORGANISMS SEEN    Report Status 09/09/2015 FINAL  Final  MRSA PCR Screening     Status: None   Collection Time: 09/14/15  8:32 PM  Result Value Ref Range Status   MRSA by PCR NEGATIVE NEGATIVE Final    Comment:        The GeneXpert MRSA Assay (FDA approved for NASAL specimens only), is one component of a comprehensive MRSA colonization surveillance program. It is not intended to diagnose MRSA infection nor to guide or monitor treatment for MRSA infections.      Studies: US Paracentesis  09/13/2015  INDICATION: Abdominal distention secondary to recurrent ascites. Request diagnostic and therapeutic paracentesis of up to 3 L max. EXAM: ULTRASOUND GUIDED LEFT LOWER QUADRANT PARACENTESIS  MEDICATIONS: None. COMPLICATIONS: None immediate. PROCEDURE: Informed written consent was obtained from the patient after a discussion of the risks, benefits and alternatives to treatment. A timeout was performed prior to the initiation of the procedure. Initial ultrasound scanning demonstrates a large amount of ascites within the right lower abdominal quadrant. The right lower abdomen was prepped and draped in the usual sterile fashion. 1% lidocaine with epinephrine was used for local anesthesia. Following this, a 7 cm, 19 gauge, Yueh catheter was introduced. An ultrasound image was saved for documentation purposes. The paracentesis was performed. The catheter was removed and a dressing was applied. The patient tolerated the procedure well without immediate post procedural complication. FINDINGS: A total of approximately 3 L of clear, amber colored fluid was removed. Samples were sent to the laboratory as requested by the clinical team. IMPRESSION: Successful ultrasound-guided paracentesis yielding 3 liters of peritoneal fluid. Read by: Ascencion Dike PA-C Electronically Signed   By: Corrie Mckusick D.O.   On: 09/13/2015 14:28   Dg Chest Portable 1 View  09/14/2015  CLINICAL DATA:  Altered level of consciousness.  Cirrhosis. EXAM: PORTABLE CHEST 1 VIEW COMPARISON:  09/06/2015 and prior radiographs FINDINGS: This is a low volume study. The cardiomediastinal silhouette and peribronchial thickening are unchanged. No large pleural effusion or pneumothorax identified. No acute bony abnormality identified. IMPRESSION: Unchanged appearance of the chest with low volumes and peribronchial thickening. Electronically Signed   By: Margarette Canada M.D.   On: 09/14/2015 19:29    Scheduled Meds: . albumin human  25 g Intravenous Q6H  . ampicillin (OMNIPEN) IV  1 g Intravenous Q8H  . antiseptic oral rinse  7 mL Mouth Rinse q12n4p  . chlorhexidine  15 mL Mouth Rinse BID  . insulin aspart  0-9 Units Subcutaneous TID WC  .  lactulose  300 mL Rectal QID  . pantoprazole (PROTONIX) IV  40 mg Intravenous Q24H  . sodium chloride  1,000 mL Intravenous Once  . sodium chloride flush  3 mL Intravenous Q12H   Continuous Infusions: .  sodium bicarbonate 150 mEq in sterile water  1000 mL infusion 100 mL/hr at 09/15/15 [id number]    Principal Problem:   Hepatic encephalopathy (HCC) Active Problems:   Acute hepatic encephalopathy (HCC)   Thrombocytopenia (HCC)   Ascites   Acute renal failure superimposed on stage 3 chronic kidney disease (HCC)   Anemia of chronic disease   Depression   Controlled type 2 diabetes mellitus with diabetic nephropathy, without long-term current use of insulin (HCC)   Diabetic neuropathy (HCC)   Coagulopathy (HCC)   NASH (nonalcoholic steatohepatitis)   Protein-calorie malnutrition, severe   Secondary biliary cirrhosis (HCC)   Portal hypertension (HCC)   Type 2 diabetes mellitus (HCC)   Pancytopenia (HCC)   Acidosis, metabolic    Time spent: 65 minutes    Saprina Chuong M.D. [facility] Pager [phone]. If 7PM-7AM, please contact night-coverage at [email or web address], password [facility] 09/15/2015, 10:23 AM  LOS: 1 day

## 2015-09-16 DIAGNOSIS — K7200 Acute and subacute hepatic failure without coma: Secondary | ICD-10-CM

## 2015-09-16 DIAGNOSIS — E876 Hypokalemia: Secondary | ICD-10-CM | POA: Diagnosis present

## 2015-09-16 LAB — CBC
HEMATOCRIT: 24.8 % — AB (ref 39.0–52.0)
HEMATOCRIT: 22.9 % — AB (ref 39.0–52.0)
HEMOGLOBIN: 8.5 g/dL — AB (ref 13.0–17.0)
HEMOGLOBIN: 7.8 g/dL — AB (ref 13.0–17.0)
MCH: 29.1 pg (ref 26.0–34.0)
MCH: 29.2 pg (ref 26.0–34.0)
MCHC: 34.3 g/dL (ref 30.0–36.0)
MCHC: 34.1 g/dL (ref 30.0–36.0)
MCV: 84.9 fL (ref 78.0–100.0)
MCV: 85.8 fL (ref 78.0–100.0)
Platelets: 23 10*3/uL — CL (ref 150–400)
Platelets: 26 10*3/uL — CL (ref 150–400)
RBC: 2.92 MIL/uL — ABNORMAL LOW (ref 4.22–5.81)
RBC: 2.67 MIL/uL — ABNORMAL LOW (ref 4.22–5.81)
RDW: 15.5 % (ref 11.5–15.5)
RDW: 15.4 % (ref 11.5–15.5)
WBC: 3.8 10*3/uL — ABNORMAL LOW (ref 4.0–10.5)
WBC: 4.4 10*3/uL (ref 4.0–10.5)

## 2015-09-16 LAB — GLUCOSE, CAPILLARY
GLUCOSE-CAPILLARY: 117 mg/dL — AB (ref 65–99)
GLUCOSE-CAPILLARY: 130 mg/dL — AB (ref 65–99)
GLUCOSE-CAPILLARY: 187 mg/dL — AB (ref 65–99)
Glucose-Capillary: 148 mg/dL — ABNORMAL HIGH (ref 65–99)
Glucose-Capillary: 146 mg/dL — ABNORMAL HIGH (ref 65–99)
Glucose-Capillary: 161 mg/dL — ABNORMAL HIGH (ref 65–99)

## 2015-09-16 LAB — BASIC METABOLIC PANEL
Anion gap: 11 (ref 5–15)
BUN: 59 mg/dL — ABNORMAL HIGH (ref 6–20)
CO2: 26 mmol/L (ref 22–32)
CREATININE: 3.19 mg/dL — AB (ref 0.61–1.24)
Calcium: 9.1 mg/dL (ref 8.9–10.3)
Chloride: 103 mmol/L (ref 101–111)
GFR, EST AFRICAN AMERICAN: 22 mL/min — AB (ref >60)
GFR, EST NON AFRICAN AMERICAN: 19 mL/min — AB (ref >60)
Glucose, Bld: 198 mg/dL — ABNORMAL HIGH (ref 65–99)
Potassium: 3.8 mmol/L (ref 3.5–5.1)
Sodium: 140 mmol/L (ref 135–145)

## 2015-09-16 LAB — COMPREHENSIVE METABOLIC PANEL
ALBUMIN: 3.7 g/dL (ref 3.5–5.0)
ALK PHOS: 58 U/L (ref 38–126)
ALT: 18 U/L (ref 17–63)
ANION GAP: 10 (ref 5–15)
AST: 34 U/L (ref 15–41)
BILIRUBIN TOTAL: 2.6 mg/dL — AB (ref 0.3–1.2)
BUN: 61 mg/dL — AB (ref 6–20)
CALCIUM: 9 mg/dL (ref 8.9–10.3)
CO2: 22 mmol/L (ref 22–32)
Chloride: 108 mmol/L (ref 101–111)
Creatinine, Ser: 3.41 mg/dL — ABNORMAL HIGH (ref 0.61–1.24)
GFR calc Af Amer: 21 mL/min — ABNORMAL LOW (ref >60)
GFR calc non Af Amer: 18 mL/min — ABNORMAL LOW (ref >60)
GLUCOSE: 144 mg/dL — AB (ref 65–99)
Potassium: 3.2 mmol/L — ABNORMAL LOW (ref 3.5–5.1)
SODIUM: 140 mmol/L (ref 135–145)
TOTAL PROTEIN: 5.7 g/dL — AB (ref 6.5–8.1)

## 2015-09-16 LAB — PROTIME-INR
INR: 1.97 — ABNORMAL HIGH (ref 0.00–1.49)
Prothrombin Time: 22.3 seconds — ABNORMAL HIGH (ref 11.6–15.2)

## 2015-09-16 LAB — URINE CULTURE: CULTURE: NO GROWTH

## 2015-09-16 LAB — AMMONIA: AMMONIA: 43 umol/L — AB (ref 9–35)

## 2015-09-16 LAB — MRSA PCR SCREENING: MRSA BY PCR: NEGATIVE

## 2015-09-16 MED ORDER — SODIUM CHLORIDE 0.9 % IV SOLN
Freq: Once | INTRAVENOUS | Status: AC
  Administered 2015-09-16: 22:00:00 via INTRAVENOUS

## 2015-09-16 MED ORDER — CHLORHEXIDINE GLUCONATE 0.12 % MT SOLN: 15.0000 mL | Freq: Two times a day (BID) | OROMUCOSAL | Status: DC

## 2015-09-16 MED ORDER — CETYLPYRIDINIUM CHLORIDE 0.05 % MT LIQD: 7.0000 mL | Freq: Two times a day (BID) | OROMUCOSAL | Status: DC

## 2015-09-16 MED ORDER — POTASSIUM CHLORIDE 10 MEQ/100ML IV SOLN: 10.0000 meq | INTRAVENOUS | Status: DC

## 2015-09-16 MED ORDER — POTASSIUM CHLORIDE 10 MEQ/50ML IV SOLN
10.0000 meq | INTRAVENOUS | Status: AC
  Administered 2015-09-16 (×4): 10 meq via INTRAVENOUS
  Filled 2015-09-16 (×4): qty 50

## 2015-09-16 MED ORDER — SODIUM CHLORIDE 0.9 % IV SOLN
Freq: Once | INTRAVENOUS | Status: AC
  Administered 2015-09-16: 16:00:00 via INTRAVENOUS

## 2015-09-16 NOTE — Consult Note (Signed)
PULMONARY / CRITICAL CARE MEDICINE   Name: [name] MRN: [id number] DOB: [birth year]    ADMISSION DATE:  09/14/2015 CONSULTATION DATE:  09/16/15   REFERRING MD:  Irine Seal  CHIEF COMPLAINT:  Hepatorenal syndrome  HISTORY OF PRESENT ILLNESS:   [name] is a 65 y/o man with h/o NASH cirrhosis who was admitted on 3/10 for worsening mental status changes.  He was noted to have worsening renal failure which was not improving despite hydration.  He was seen by nephrology and this was felt to most likely be hepatorenal syndrome.  He was transferred to the ICU for levophed to aid in diuresis.   PAST MEDICAL HISTORY :  He  has a past medical history of Ascites; Acute hepatic encephalopathy ([place]); Acute renal failure superimposed on stage 3 chronic kidney disease ([place]); Anemia of chronic disease; Bleeding esophageal varices ([place]); Coagulopathy ([place]); Diabetes mellitus type II, controlled ([place]); Depression; Diabetic neuropathy ([place]); Esophageal stricture; NASH (nonalcoholic steatohepatitis); Protein-calorie malnutrition, severe ([place]); Secondary biliary cirrhosis ([place]); Thrombocytopenia ([place]); and Abscess in epidural space of cervical spine (08/13/2015).  PAST SURGICAL HISTORY: He  has past surgical history that includes Kidney stone surgery (yrs ago); Esophagogastroduodenoscopy (08/01/2011); Esophagogastroduodenoscopy (N/A, 10/27/2012); Esophagogastroduodenoscopy (N/A, 11/22/2012); Colonoscopy; Upper gastrointestinal endoscopy; Esophagogastroduodenoscopy (N/A, 11/29/2013); esophageal banding (Bilateral, 11/29/2013); Esophagogastroduodenoscopy (N/A, 06/13/2014); and esophageal banding (N/A, 06/13/2014).  No Known Allergies  No current facility-administered medications on file prior to encounter.   Current Outpatient Prescriptions on File Prior to Encounter  Medication Sig  . acidophilus (RISAQUAD) CAPS capsule Take 2 capsules by mouth daily.  . Amino Acids-Protein Hydrolys (FEEDING SUPPLEMENT,  PRO-STAT SUGAR FREE 64,) LIQD Take 30 mLs by mouth 3 (three) times daily. (Patient taking differently: Take 30 mLs by mouth daily. )  . amoxicillin (AMOXIL) 500 MG capsule Take 1 capsule (500 mg total) by mouth 3 (three) times daily.  . baclofen (LIORESAL) 20 MG tablet Take 1 tablet (20 mg total) by mouth at bedtime.  . bethanechol (URECHOLINE) 50 MG tablet Take 1 tablet by mouth 2 (two) times daily.   Marland Kitchen FLUoxetine (PROZAC) 10 MG tablet Take 10 mg by mouth every morning.   . gabapentin (NEURONTIN) 100 MG capsule Take 100 mg by mouth 3 (three) times daily.   . Insulin Degludec (TRESIBA FLEXTOUCH) 200 UNIT/ML SOPN Inject 20 Units into the skin daily. (Patient taking differently: Inject 40 Units into the skin daily. )  . lactulose (CHRONULAC) 10 GM/15ML solution Take 45 mLs (30 g total) by mouth 3 (three) times daily.  . Multiple Vitamin (MULTIVITAMIN WITH MINERALS) TABS tablet Take 1 tablet by mouth daily.  . pantoprazole (PROTONIX) 40 MG tablet TAKE 1 TABLET (40 MG TOTAL) BY MOUTH DAILY.  . tamsulosin (FLOMAX) 0.4 MG CAPS capsule Take 1 capsule by mouth 2 (two) times daily.   . Vitamin D, Ergocalciferol, (DRISDOL) 50000 UNITS CAPS capsule Take 50,000 Units by mouth every 7 (seven) days. Mondays.  Marland Kitchen XIFAXAN 550 MG TABS tablet TAKE 1 TABLET (550 MG TOTAL) BY MOUTH 2 (TWO) TIMES DAILY.    FAMILY HISTORY:  His indicated that his mother is deceased. He indicated that his father is deceased. He indicated that his sister is alive. He indicated that his brother is alive.   SOCIAL HISTORY: He  reports that he has never smoked. He has never used smokeless tobacco. He reports that he does not drink alcohol or use illicit drugs.  REVIEW OF SYSTEMS:   A complete review of systems could not be obtained as  pt is encephalopathic  SUBJECTIVE:    VITAL SIGNS: BP 116/67 mmHg  Pulse 63  Temp(Src) 97.9 F (36.6 C) (Axillary)  Resp 12  Ht 5\' 10"  (1.778 m)  Wt 77.6 kg (171 lb 1.2 oz)  BMI 24.55 kg/m2   SpO2 98%  HEMODYNAMICS:    VENTILATOR SETTINGS:    INTAKE / OUTPUT: I/O last 3 completed shifts: In: 2437.2 [I.V.:2087.2; IV Piggyback:350] Out: 375 [Urine:375]  PHYSICAL EXAMINATION: General:  Laying in bed, sleeping Neuro:  Awakens to voice but does not answer questions appropriately.  HEENT:  PERRL, EOMI, OP clear Cardiovascular:  NRRR, no mrg Lungs:  CTAB, no wrr Abdomen:  Distended, NT, no palpable HSM Musculoskeletal:  No joint abnormalities Skin:  Multiple bruises on arms.   LABS:  BMET  Recent Labs Lab 09/14/15 1604 09/15/15 0511 09/15/15 1826  NA 135 138 141  K 3.8 3.6 3.4*  CL 109 110 107  CO2 14* 18* 21*  BUN 59* 60* 61*  CREATININE 3.37* 3.52* 3.54*  GLUCOSE 298* 190* 105*    Electrolytes  Recent Labs Lab 09/14/15 1604 09/14/15 2102 09/15/15 0511 09/15/15 1826  CALCIUM 9.0  --  8.9 8.9  MG  --  2.8*  --   --     CBC  Recent Labs Lab 09/10/15 0535 09/14/15 1604 09/15/15 0511  WBC 3.1* 2.1* 2.9*  HGB 10.1* 9.1* 9.1*  HCT 28.7* 26.6* 26.4*  PLT 20* 20* 21*    Coag's  Recent Labs Lab 09/15/15 0511  APTT 39*  INR 1.88*    Sepsis Markers  Recent Labs Lab 09/14/15 1558 09/15/15 0511  LATICACIDVEN 2.53* 2.0    ABG No results for input(s): PHART, PCO2ART, PO2ART in the last 168 hours.  Liver Enzymes  Recent Labs Lab 09/10/15 0535 09/14/15 1604 09/15/15 0511  AST 25 25 27   ALT 22 18 19   ALKPHOS 104 75 66  BILITOT 1.1 0.9 1.4*  ALBUMIN 3.0* 3.4* 3.2*    Cardiac Enzymes No results for input(s): TROPONINI, PROBNP in the last 168 hours.  Glucose  Recent Labs Lab 09/15/15 0058 09/15/15 0610 09/15/15 0751 09/15/15 1218 09/15/15 1716 09/16/15 0422  GLUCAP 186* 161* 144* 109* 82 130*    Imaging No results found.   STUDIES:    CULTURES:   ANTIBIOTICS: Ampicillin for known discitis  SIGNIFICANT EVENTS: Transferred to ICU for levophed  LINES/TUBES: PICC 09/15/15  DISCUSSION: 65 y/o man with  NASH cirrhosis, hepatic encephalopathy and hepatorenal syndrome.   ASSESSMENT / PLAN:  PULMONARY A: No acute issues P:   Continuous pulse ox monitoring  CARDIOVASCULAR A:  hypotension P:  leveophed to maintain [id number] and systolic BP [id number]  RENAL A:   HRS P:   Levophed for renal perfusion Albumin q6   GASTROINTESTINAL A:   Hepatic encephalopathy P:   Continue lactulose enemas   HEMATOLOGIC A:   thrombocytopenia P:  Transfuse for platelets <10, acute bleeding or procedures  INFECTIOUS A:   discitis P:   ampicillin  ENDOCRINE A:   At risk for hypoglycemia   P:   Monitor glucose on daily labs  NEUROLOGIC A:   Hepatic encephalopathy P:   RASS goal: 0 Avoid any sedating medications   FAMILY  - Updates: wife at bedside  - Inter-disciplinary family meet or Palliative Care meeting due by:  day 7    [facility] [place] Pager: [phone]  09/16/2015, 5:02 AM

## 2015-09-16 NOTE — Progress Notes (Signed)
eLink Physician-Brief Progress Note Patient Name: [name] DOB: [birth year] MRN: [id number]   Date of Service  09/16/2015  HPI/Events of Note  Low K  eICU Interventions  Not replaced b/c of poor uop     Intervention Category Minor Interventions: Electrolytes abnormality - evaluation and management  Mauri [place], P 09/16/2015, 5:39 AM

## 2015-09-16 NOTE — Progress Notes (Signed)
Notified E-Link Md about pt's temp of 100.9 F orally s/p first FFP transfusion. No new orders received. Will continue to transfuse as per Md. Will continue to monitor pt's temp.

## 2015-09-16 NOTE — Progress Notes (Signed)
[facility] PROGRESS NOTE  [name] [id number] DOB: [birth year] DOA: 09/14/2015 PCP: [name], MD  Assessment/Plan: #1 acute hepatic encephalopathy Patient more alert than yesterday however still somewhat drowsy. Ammonia levels pending for this morning. Ammonia levels yesterday was 154 from 271. Continue lactulose enemas every 6 hours. Once patient is more alert and able to tolerate oral intake will add xifaxin. Follow.  #2 acute on chronic kidney disease stage III  Acute on chronic kidney disease stage III likely hepatorenal syndrome. Urinalysis leukocytes negative, nitrite negative, protein negative. Urine sodium less than 10. Patient with poor urine output and was 410 mL over the past 24 hours. Continue Foley catheter. Continue bicarbonate drip, albumin every 6 hours, Levothroid drip. Nephrology following and appreciate input and recommendations.   #3 acidosis Likely secondary to problem #2. Continue bicarbonate drip.  #4 pancytopenia Chronic in nature. Secondary to cirrhosis. Patient with bleeding around PICC site. Will transfuse a unit of platelets and FFP.  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 Patient has a follow-up appointment at [facility] on 09/19/2015 to be considered for transplant. Patient with repeated paracenteses every 3 days at [place] [facility]. Last paracentesis 09/13/2015 with 3 L of fluid removed per wife with no evidence of SBP. Patient received albumin postprocedure. Patient may need thoracentesis during this hospitalization however will monitor for now. Repeat abdominal ultrasound tomorrow. Spoke with Dr. Ronalee Red ( CCM) and Dr Romilda Joy (transplant)  with [facility] to see if patient can be transferred to the facility as patient as supposed to be evaluated for transplant on 09/19/2015. Patient currently on list. Per nursing no bed available today.  #6 esophageal varices Stable. No bleeding. Monitor  closely.  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 Patient currently more alert, continue on IV ampicillin at this time. Once more alert and tolerating oral intake will resume oral suppressive antibiotics.  #8 diabetes mellitus type 2 Sliding scale insulin.  #9 gastroesophageal reflux disease PPI.  #10 BPH Once more alert will resume home regimen.  #11 severe protein calorie malnutrition  #12 depression Once more alert will resume home regimen.  #13 hypokalemia Replete.  #14 prophylaxis PPI for GI prophylaxis. SCDs for DVT prophylaxis.  Code Status: DO NOT RESUSCITATE Family Communication: Updated wife at bedside. Disposition Plan: Remain in ICU until bed available to be [facility].   Consultants:  Nephrology: Dr. Jonnie Finner 09/15/2015  Critical care medicine Dr. Lincoln Maxin 09/16/2015  Procedures: CXR  PICC line 09/15/2015 1 unit platelets 09/16/2015 1 unit FFP 09/16/2015  Antibiotics:  IV Ampicillin 09/14/2015  HPI/Subjective: Patient more alert today. Answers some questions.  Objective: Filed Vitals:   09/16/15 0730 09/16/15 0800  BP: 96/54 138/68  Pulse: 62 77  Temp:  98.5 F (36.9 C)  Resp: 13 14    Intake/Output Summary (Last 24 hours) at 09/16/15 0906 Last data filed at 09/16/15 0800  Gross per 24 hour  Intake 3435.16 ml  Output    465 ml  Net 2970.16 ml   Filed Weights   09/14/15 2026 09/15/15 0500  Weight: 77.6 kg (171 lb 1.2 oz) 77.6 kg (171 lb 1.2 oz)    Exam:   General: Opens eyes to verbal stimuli. Protecting airway.  Cardiovascular: Regular rate and rhythm  Respiratory: Clear to auscultation bilaterally anterior lung fields.  Abdomen: Soft/NT/Distended/+BS/+ fluid wave  Musculoskeletal: No c/c/e  Data Reviewed: Basic Metabolic Panel:  Recent Labs Lab 09/10/15 0535 09/14/15 1604 09/14/15 2102  09/15/15 0511 09/15/15 1826 09/16/15 0445  NA 134* 135  --  138 141 140  K 4.3 3.8  --  3.6 3.4* 3.2*  CL 106 109  --  110 107 108  CO2 18* 14*  --  18* 21* 22  GLUCOSE 304* 298*  --  190* 105* 144*  BUN 52* 59*  --  60* 61* 61*  CREATININE 2.34* 3.37*  --  3.52* 3.54* 3.41*  CALCIUM 9.1 9.0  --  8.9 8.9 9.0  MG  --   --  2.8*  --   --   --    Liver Function Tests:  Recent Labs Lab 09/10/15 0535 09/14/15 1604 09/15/15 0511 09/16/15 0445  AST 25 25 27  34  ALT 22 18 19 18   ALKPHOS 104 75 66 58  BILITOT 1.1 0.9 1.4* 2.6*  PROT 5.4* 5.4* 5.2* 5.7*  ALBUMIN 3.0* 3.4* 3.2* 3.7   No results for input(s): LIPASE, AMYLASE in the last 168 hours.  Recent Labs Lab 09/10/15 0535 09/14/15 1605 09/15/15 0511  AMMONIA 74* 271* 154*   CBC:  Recent Labs Lab 09/10/15 0535 09/14/15 1604 09/15/15 0511 09/16/15 0445  WBC 3.1* 2.1* 2.9* 3.8*  NEUTROABS  --  1.6* 2.3  --   HGB 10.1* 9.1* 9.1* 8.5*  HCT 28.7* 26.6* 26.4* 24.8*  MCV 87.2 85.5 85.2 84.9  PLT 20* 20* 21* 23*   Cardiac Enzymes: No results for input(s): CKTOTAL, CKMB, CKMBINDEX, TROPONINI in the last 168 hours. BNP (last 3 results)  Recent Labs  07/25/15 1144  BNP 75.7    ProBNP (last 3 results) No results for input(s): PROBNP in the last 8760 hours.  CBG:  Recent Labs Lab 09/15/15 0751 09/15/15 1218 09/15/15 1716 09/16/15 0422 09/16/15 0715  GLUCAP 144* 109* 82 130* 148*    Recent Results (from the past 240 hour(s))  Culture, blood (routine x 2)     Status: None   Collection Time: 09/06/15  7:20 PM  Result Value Ref Range Status   Specimen Description BLOOD LEFT ARM  Final   Special Requests BOTTLES DRAWN AEROBIC AND ANAEROBIC 5CC  Final   Culture NO GROWTH 5 DAYS  Final   Report Status 09/11/2015 FINAL  Final  Culture, blood (routine x 2)     Status: None   Collection Time: 09/06/15  7:25 PM  Result Value Ref Range Status   Specimen Description BLOOD LEFT ARM  Final   Special Requests  BOTTLES DRAWN AEROBIC AND ANAEROBIC 5CC  Final   Culture NO GROWTH 5 DAYS  Final   Report Status 09/11/2015 FINAL  Final  MRSA PCR Screening     Status: None   Collection Time: 09/06/15 11:36 PM  Result Value Ref Range Status   MRSA by PCR NEGATIVE NEGATIVE Final    Comment:        The GeneXpert MRSA Assay (FDA approved for NASAL specimens only), is one component of a comprehensive MRSA colonization surveillance program. It is not intended to diagnose MRSA infection nor to guide or monitor treatment for MRSA infections.   Culture, body fluid-bottle     Status: None   Collection Time: 09/07/15  9:45 AM  Result Value Ref Range Status   Specimen Description FLUID PERITONEAL  Final   Special Requests NONE  Final   Culture NO GROWTH 5 DAYS  Final   Report Status 09/12/2015 FINAL  Final  Gram stain     Status: None   Collection Time: 09/07/15  9:45 AM  Result Value Ref Range Status   Specimen Description FLUID PERITONEAL  Final   Special Requests NONE  Final   Gram Stain   Final    DEGENERATED CELLULAR MATERIAL PRESENT RBC PRESENT NO ORGANISMS SEEN    Report Status 09/07/2015 FINAL  Final  Culture, body fluid-bottle     Status: None   Collection Time: 09/09/15 12:54 PM  Result Value Ref Range Status   Specimen Description PERITONEAL ABDOMEN  Final   Special Requests NONE  Final   Culture NO GROWTH 5 DAYS  Final   Report Status 09/14/2015 FINAL  Final  Gram stain     Status: None   Collection Time: 09/09/15 12:54 PM  Result Value Ref Range Status   Specimen Description PERITONEAL ABDOMEN  Final   Special Requests NONE  Final   Gram Stain   Final    RARE WBC PRESENT, PREDOMINANTLY MONONUCLEAR NO ORGANISMS SEEN    Report Status 09/09/2015 FINAL  Final  MRSA PCR Screening     Status: None   Collection Time: 09/14/15  8:32 PM  Result Value Ref Range Status   MRSA by PCR NEGATIVE NEGATIVE Final    Comment:        The GeneXpert MRSA Assay (FDA approved for NASAL  specimens only), is one component of a comprehensive MRSA colonization surveillance program. It is not intended to diagnose MRSA infection nor to guide or monitor treatment for MRSA infections.   MRSA PCR Screening     Status: None   Collection Time: 09/15/15 11:00 PM  Result Value Ref Range Status   MRSA by PCR NEGATIVE NEGATIVE Final    Comment:        The GeneXpert MRSA Assay (FDA approved for NASAL specimens only), is one component of a comprehensive MRSA colonization surveillance program. It is not intended to diagnose MRSA infection nor to guide or monitor treatment for MRSA infections.      Studies: Dg Chest Portable 1 View  09/14/2015  CLINICAL DATA:  Altered level of consciousness.  Cirrhosis. EXAM: PORTABLE CHEST 1 VIEW COMPARISON:  09/06/2015 and prior radiographs FINDINGS: This is a low volume study. The cardiomediastinal silhouette and peribronchial thickening are unchanged. No large pleural effusion or pneumothorax identified. No acute bony abnormality identified. IMPRESSION: Unchanged appearance of the chest with low volumes and peribronchial thickening. Electronically Signed   By: Margarette Canada M.D.   On: 09/14/2015 19:29    Scheduled Meds: . albumin human  25 g Intravenous Q6H  . ampicillin (OMNIPEN) IV  1 g Intravenous Q8H  . antiseptic oral rinse  7 mL Mouth Rinse q12n4p  . chlorhexidine  15 mL Mouth Rinse BID  . insulin aspart  0-9 Units Subcutaneous TID WC  . lactulose  300 mL Rectal QID  . pantoprazole (PROTONIX) IV  40 mg Intravenous Q24H  . potassium chloride  10 mEq Intravenous Q1 Hr x 4  . sodium chloride flush  10-40 mL Intracatheter Q12H  . sodium chloride flush  3 mL Intravenous Q12H   Continuous Infusions: . norepinephrine (LEVOPHED) Adult infusion 9 mcg/min (09/16/15 0800)  .  sodium bicarbonate 150 mEq in sterile water 1000 mL infusion 100 mL/hr at 09/16/15 0300    Principal Problem:   Hepatic encephalopathy (HCC) Active Problems:    Acute hepatic encephalopathy (HCC)   Thrombocytopenia (HCC)   Ascites   Acute renal failure superimposed  on stage 3 chronic kidney disease (HCC)   Anemia of chronic disease   Depression   Controlled type 2 diabetes mellitus with diabetic nephropathy, without long-term current use of insulin (HCC)   Diabetic neuropathy (HCC)   Coagulopathy (HCC)   NASH (nonalcoholic steatohepatitis)   Protein-calorie malnutrition, severe   Secondary biliary cirrhosis (HCC)   Portal hypertension (HCC)   Type 2 diabetes mellitus (HCC)   Pancytopenia (HCC)   Acidosis, metabolic    Time spent: 76 minutes    Ilaria Much M.D. [facility] Pager [phone]. If 7PM-7AM, please contact night-coverage at [email or web address], password [facility] 09/16/2015, 9:06 AM  LOS: 2 days

## 2015-09-16 NOTE — Evaluation (Signed)
Occupational Therapy Evaluation Patient Details Name: [name] MRN: [id number] DOB: 07/22/1950 Today's Date: 09/16/2015    History of Present Illness Pt readmitted with hepatic encephalopathy and hepatorenal syndrome. PMH - NASH cirrhosis with frequent admissions, DM, GI bleeds, epidural abscess.   Clinical Impression   This 65 yo male admitted with above presents to acute OT with deficits below affecting his ability to A with basic ADLs and mobilize as he was pta. He will benefit from acute OT with follow up to be determined to get back to his PLOF.    Follow Up Recommendations  Other (comment) (TBD, pt per PT normally "bounces back" and can go home--not sure this time and pt may transfer to [facility])    Equipment Recommendations  None recommended by OT       Precautions / Restrictions Precautions Precautions: Fall Precaution Comments: watch BP      Mobility Bed Mobility Overal bed mobility: Needs Assistance Bed Mobility: Supine to Sit;Sit to Sidelying     Supine to sit: +2 for physical assistance;Total assist   Sit to sidelying: +2 for physical assistance;Total assist General bed mobility comments: Assist for all aspects     Balance Overall balance assessment: Needs assistance Sitting-balance support: Bilateral upper extremity supported;Feet supported Sitting balance-Leahy Scale: Poor Sitting balance - Comments: Sat EOB x 5 minutes with initial total assist and progressing to min guard. Pt keeping feet off of ground most of time. Postural control: Left lateral lean;Posterior lean                                  ADL Overall ADL's : Needs assistance/impaired                                       General ADL Comments: Pt curerntly Total A due to decreased command following               Pertinent Vitals/Pain Pain Assessment: Faces Faces Pain Scale: No hurt     Hand Dominance Right   Extremity/Trunk Assessment Upper  Extremity Assessment Upper Extremity Assessment: Generalized weakness   Lower Extremity Assessment Lower Extremity Assessment: Difficult to assess due to impaired cognition;Generalized weakness       Communication Communication Communication: No difficulties   Cognition Arousal/Alertness: Lethargic Behavior During Therapy: Flat affect Overall Cognitive Status: Impaired/Different from baseline Area of Impairment: Orientation;Attention;Memory;Following commands;Safety/judgement;Problem solving Orientation Level: Disoriented to;Place;Time;Situation Current Attention Level: Focused Memory: Decreased short-term memory Following Commands:  (Not following commands) Safety/Judgement: Decreased awareness of deficits;Decreased awareness of safety   Problem Solving: Slow processing;Decreased initiation;Requires verbal cues;Requires tactile cues;Difficulty sequencing                Home Living Family/patient expects to be discharged to:: Private residence Living Arrangements: Spouse/significant other Available Help at Discharge: Family;Available 24 hours/day Type of Home: House Home Access: Stairs to enter [employer] of Steps: 2 Entrance Stairs-Rails: Right;Left;Can reach both Home Layout: Multi-level;Able to live on main level with bedroom/bathroom Alternate Level Stairs-Number of Steps: 1 flight Alternate Level Stairs-Rails: Right;Left Bathroom Shower/Tub: Teacher, early years/pre: Standard     Home Equipment: Environmental consultant - 2 wheels;Shower seat;Cane - single point;Bedside commode          Prior Functioning/Environment Level of Independence: Needs assistance  Gait / Transfers Assistance Needed: Amb with walker since  recent admissions ADL's / Homemaking Assistance Needed: wife now performing 75% of ADLS        OT Diagnosis: Generalized weakness;Cognitive deficits   OT Problem List: Decreased strength;Decreased activity tolerance;Impaired balance (sitting  and/or standing);Decreased cognition;Decreased safety awareness;Decreased knowledge of use of DME or AE;Impaired UE functional use   OT Treatment/Interventions: Self-care/ADL training;DME and/or AE instruction;Patient/family education;Cognitive remediation/compensation;Therapeutic activities;Therapeutic exercise;Balance training    OT Goals(Current goals can be found in the care plan section) Acute Rehab OT Goals Patient Stated Goal: wife would like to get him back home OT Goal Formulation: With family Time For Goal Achievement: 09/30/15 Potential to Achieve Goals: Good  OT Frequency: Min 2X/week           Co-evaluation PT/OT/SLP Co-Evaluation/Treatment: Yes Reason for Co-Treatment: Complexity of the patient's impairments (multi-system involvement) PT goals addressed during session: Mobility/safety with mobility;Balance OT goals addressed during session: Strengthening/ROM      End of Session Nurse Communication:  (PICC line bleeding in right arm)  Activity Tolerance: Patient limited by lethargy Patient left: in bed;with family/visitor present   Time: [id number] OT Time Calculation (min): 22 min Charges:  OT General Charges $OT Visit: 1 Procedure OT Evaluation $OT Eval Moderate Complexity: 1 Procedure  [name] [id number] 09/16/2015, 9:10 AM

## 2015-09-16 NOTE — Progress Notes (Signed)
  [facility] Progress Note   Subjective: not much more alert.  UOP 25 cc/hr.  Ono levo gtt with bp 130's now.    Filed Vitals:   09/16/15 1123 09/16/15 1138 09/16/15 1200 09/16/15 1222  BP: 131/73 124/68 122/66 122/68  Pulse: 73 64 67 72  Temp:  99 F (37.2 C)  98.7 F (37.1 C)  TempSrc:  Oral  Axillary  Resp: 13 15 15 13   Height:      Weight:      SpO2: 98% 96% 96% 97%    Inpatient medications: . sodium chloride   Intravenous Once  . albumin human  25 g Intravenous Q6H  . ampicillin (OMNIPEN) IV  1 g Intravenous Q8H  . antiseptic oral rinse  7 mL Mouth Rinse q12n4p  . chlorhexidine  15 mL Mouth Rinse BID  . insulin aspart  0-9 Units Subcutaneous TID WC  . lactulose  300 mL Rectal QID  . pantoprazole (PROTONIX) IV  40 mg Intravenous Q24H  . sodium chloride flush  10-40 mL Intracatheter Q12H  . sodium chloride flush  3 mL Intravenous Q12H   . norepinephrine (LEVOPHED) Adult infusion 9 mcg/min (09/16/15 0800)  .  sodium bicarbonate 150 mEq in sterile water 1000 mL infusion 100 mL/hr at 09/16/15 0928   albuterol, alum & mag hydroxide-simeth, ondansetron **OR** ondansetron (ZOFRAN) IV, sodium chloride flush  Exam: Stuporous, minimally responsive No rash, cyanosis or gangrene Sclera anicteric, throat clear No jvd, flat neck veins Chest clear bilat RRR no mrg Abd moderate-severe ascites, no hsm, +bs GU foley in place Ext no LE or UE edema Neuro as above, stuporous minimal verbal response  UNa <210 UCreat 218 ECHO normal LV // CXR no edema  Assessment: 1 Acute on CKD4 - hepatorenal syndrome cause of AKI.  Has underlying CKD which makes his chances of renal recovery less likely than if he had just HRS w AKI.  Cont levo gtt, goal SBP > 120, IV albumin.  2 CKD3b - baseline creat 2.0- 2.5 3 Cirrhosis/ NASH 4 Hepatic encephalopathy 5 Anemia 6 Thrombocytopenia 7 Volume - has ascites, no other edema  Plan - cont levo gtt, albumin . Prognosis guarded.     Kelly Splinter MD [facility] pager [phone]    cell [phone] 09/16/2015, 1:35 PM    Recent Labs Lab 09/15/15 0511 09/15/15 1826 09/16/15 0445  NA 138 141 140  K 3.6 3.4* 3.2*  CL 110 107 108  CO2 18* 21* 22  GLUCOSE 190* 105* 144*  BUN 60* 61* 61*  CREATININE 3.52* 3.54* 3.41*  CALCIUM 8.9 8.9 9.0    Recent Labs Lab 09/14/15 1604 09/15/15 0511 09/16/15 0445  AST 25 27 34  ALT 18 19 18   ALKPHOS 75 66 58  BILITOT 0.9 1.4* 2.6*  PROT 5.4* 5.2* 5.7*  ALBUMIN 3.4* 3.2* 3.7    Recent Labs Lab 09/14/15 1604 09/15/15 0511 09/16/15 0445  WBC 2.1* 2.9* 3.8*  NEUTROABS 1.6* 2.3  --   HGB 9.1* 9.1* 8.5*  HCT 26.6* 26.4* 24.8*  MCV 85.5 85.2 84.9  PLT 20* 21* 23*

## 2015-09-16 NOTE — Progress Notes (Signed)
eLink Physician-Brief Progress Note Patient Name: [name] DOB: Oct 03, 1950 MRN: [id number]   Date of Service  09/16/2015  HPI/Events of Note  Hx of liver failure. Patient with significant oozing from PICC line site.   eICU Interventions  Will order: 1. Transfuse 2 units FFP. 2. Transfuse 1 unit single donor platelets.  3. Check CBC with platelets and PT/INR s/p transfusion.      Intervention Category Intermediate Interventions: Coagulopathy - evaluation and management  Sommer,Steven Eugene 09/16/2015, 8:22 PM

## 2015-09-16 NOTE — Evaluation (Signed)
Physical Therapy Evaluation Patient Details Name: [name] MRN: [id number] DOB: 03/03/1951 Today's Date: 09/16/2015   History of Present Illness  Pt readmitted with hepatic encephalopathy and hepatorenal syndrome. PMH - NASH cirrhosis with frequent admissions, DM, GI bleeds, epidural abscess.  Clinical Impression  Pt well known to me from previous admissions. Currently with severely impaired cognition and mobility. In the past this has improved quickly as medical status has improved. May be able to return home if this happens. If medical status doesn't improve significantly other options may need to be explored.    Follow Up Recommendations Other (comment) (To be determined based on medical status)    Equipment Recommendations  None recommended by PT    Recommendations for Other Services       Precautions / Restrictions Precautions Precautions: Fall Precaution Comments: watch BP      Mobility  Bed Mobility Overal bed mobility: Needs Assistance Bed Mobility: Supine to Sit;Sit to Sidelying     Supine to sit: +2 for physical assistance;Total assist   Sit to sidelying: +2 for physical assistance;Total assist General bed mobility comments: Assist for all aspects  Transfers                    Ambulation/Gait                Stairs            Wheelchair Mobility    Modified Rankin (Stroke Patients Only)       Balance Overall balance assessment: Needs assistance Sitting-balance support: Bilateral upper extremity supported;Feet supported Sitting balance-Leahy Scale: Poor Sitting balance - Comments: Sat EOB x 5 minutes with initial total assist and progressing to min guard. Pt keeping feet off of ground most of time. Postural control: Left lateral lean;Posterior lean                                   Pertinent Vitals/Pain Pain Assessment: Faces Faces Pain Scale: No hurt    Home Living Family/patient expects to be discharged  to:: Private residence Living Arrangements: Spouse/significant other Available Help at Discharge: Family;Available 24 hours/day Type of Home: House Home Access: Stairs to enter Entrance Stairs-Rails: Right;Left;Can reach both Entrance Stairs-Number of Steps: 2 Home Layout: Multi-level;Able to live on main level with bedroom/bathroom Home Equipment: Gilford Rile - 2 wheels;Shower seat;Cane - single point;Bedside commode      Prior Function Level of Independence: Needs assistance   Gait / Transfers Assistance Needed: Amb with walker since recent admissions  ADL's / Homemaking Assistance Needed: wife now performing 75% of ADLS        Hand Dominance   Dominant Hand: Right    Extremity/Trunk Assessment   Upper Extremity Assessment: Generalized weakness           Lower Extremity Assessment: Difficult to assess due to impaired cognition;Generalized weakness         Communication   Communication: No difficulties  Cognition Arousal/Alertness: Lethargic Behavior During Therapy: Flat affect Overall Cognitive Status: Impaired/Different from baseline Area of Impairment: Orientation;Attention;Memory;Following commands;Safety/judgement;Problem solving Orientation Level: Disoriented to;Place;Time;Situation Current Attention Level: Focused Memory: Decreased short-term memory Following Commands:  (Not following commands) Safety/Judgement: Decreased awareness of deficits;Decreased awareness of safety   Problem Solving: Slow processing;Decreased initiation;Requires verbal cues;Requires tactile cues;Difficulty sequencing      General Comments      Exercises        Assessment/Plan  PT Assessment Patient needs continued PT services  PT Diagnosis Difficulty walking;Generalized weakness;Altered mental status   PT Problem List Decreased strength;Decreased activity tolerance;Decreased balance;Decreased mobility;Decreased cognition  PT Treatment Interventions DME instruction;Gait  training;Functional mobility training;Therapeutic activities;Therapeutic exercise;Balance training;Cognitive remediation;Patient/family education   PT Goals (Current goals can be found in the Care Plan section) Acute Rehab PT Goals Patient Stated Goal: wife would like to get him back home PT Goal Formulation: With family Time For Goal Achievement: 09/30/15 Potential to Achieve Goals: Fair    Frequency Min 3X/week   Barriers to discharge        Co-evaluation PT/OT/SLP Co-Evaluation/Treatment: Yes Reason for Co-Treatment: Complexity of the patient's impairments (multi-system involvement) PT goals addressed during session: Mobility/safety with mobility;Balance OT goals addressed during session: Strengthening/ROM       End of Session Equipment Utilized During Treatment: Gait belt Activity Tolerance: Treatment limited secondary to medical complications (Comment) (bleeding IV site) Patient left: in bed;with call bell/phone within reach;with family/visitor present Nurse Communication: Mobility status;Other (comment) (bleeding IV site)         Time: [id number] PT Time Calculation (min) (ACUTE ONLY): 18 min   Charges:   PT Evaluation $PT Eval Moderate Complexity: 1 Procedure     PT G Codes:        Henrique Parekh September 20, 2015, 9:10 AM Cedricka Sackrider PT [phone]

## 2015-09-17 ENCOUNTER — Ambulatory Visit (HOSPITAL_COMMUNITY): Payer: Managed Care, Other (non HMO)

## 2015-09-17 ENCOUNTER — Ambulatory Visit (HOSPITAL_COMMUNITY): Admit: 2015-09-17 | Payer: Managed Care, Other (non HMO)

## 2015-09-17 DIAGNOSIS — D689 Coagulation defect, unspecified: Secondary | ICD-10-CM

## 2015-09-17 DIAGNOSIS — E872 Acidosis: Secondary | ICD-10-CM

## 2015-09-17 DIAGNOSIS — K7581 Nonalcoholic steatohepatitis (NASH): Secondary | ICD-10-CM

## 2015-09-17 DIAGNOSIS — N179 Acute kidney failure, unspecified: Secondary | ICD-10-CM

## 2015-09-17 DIAGNOSIS — R579 Shock, unspecified: Secondary | ICD-10-CM

## 2015-09-17 DIAGNOSIS — K7290 Hepatic failure, unspecified without coma: Secondary | ICD-10-CM

## 2015-09-17 DIAGNOSIS — D61818 Other pancytopenia: Secondary | ICD-10-CM

## 2015-09-17 DIAGNOSIS — N183 Chronic kidney disease, stage 3 (moderate): Secondary | ICD-10-CM

## 2015-09-17 LAB — CBC
HEMATOCRIT: 20.4 % — AB (ref 39.0–52.0)
HEMATOCRIT: 29 % — AB (ref 39.0–52.0)
HEMOGLOBIN: 6.9 g/dL — AB (ref 13.0–17.0)
HEMOGLOBIN: 9.6 g/dL — AB (ref 13.0–17.0)
MCH: 29.1 pg (ref 26.0–34.0)
MCH: 28.5 pg (ref 26.0–34.0)
MCHC: 33.8 g/dL (ref 30.0–36.0)
MCHC: 33.1 g/dL (ref 30.0–36.0)
MCV: 86.1 fL (ref 78.0–100.0)
MCV: 86.1 fL (ref 78.0–100.0)
Platelets: 31 10*3/uL — ABNORMAL LOW (ref 150–400)
Platelets: 22 10*3/uL — CL (ref 150–400)
RBC: 2.37 MIL/uL — ABNORMAL LOW (ref 4.22–5.81)
RBC: 3.37 MIL/uL — AB (ref 4.22–5.81)
RDW: 15.6 % — AB (ref 11.5–15.5)
RDW: 15.5 % (ref 11.5–15.5)
WBC: 5.4 10*3/uL (ref 4.0–10.5)
WBC: 5 10*3/uL (ref 4.0–10.5)

## 2015-09-17 LAB — PREPARE RBC (CROSSMATCH)

## 2015-09-17 LAB — PREPARE PLATELET PHERESIS
Unit division: 0
Unit division: 0

## 2015-09-17 LAB — GLUCOSE, CAPILLARY
GLUCOSE-CAPILLARY: 179 mg/dL — AB (ref 65–99)
GLUCOSE-CAPILLARY: 238 mg/dL — AB (ref 65–99)
Glucose-Capillary: 256 mg/dL — ABNORMAL HIGH (ref 65–99)

## 2015-09-17 LAB — PREPARE FRESH FROZEN PLASMA
UNIT DIVISION: 0
Unit division: 0

## 2015-09-17 LAB — COMPREHENSIVE METABOLIC PANEL
ALBUMIN: 3.5 g/dL (ref 3.5–5.0)
ALT: 15 U/L — ABNORMAL LOW (ref 17–63)
ANION GAP: 13 (ref 5–15)
AST: 28 U/L (ref 15–41)
Alkaline Phosphatase: 47 U/L (ref 38–126)
BILIRUBIN TOTAL: 2.9 mg/dL — AB (ref 0.3–1.2)
BUN: 49 mg/dL — AB (ref 6–20)
CHLORIDE: 91 mmol/L — AB (ref 101–111)
CO2: 36 mmol/L — AB (ref 22–32)
Calcium: 8.2 mg/dL — ABNORMAL LOW (ref 8.9–10.3)
Creatinine, Ser: 2.78 mg/dL — ABNORMAL HIGH (ref 0.61–1.24)
GFR calc Af Amer: 26 mL/min — ABNORMAL LOW (ref >60)
GFR calc non Af Amer: 23 mL/min — ABNORMAL LOW (ref >60)
GLUCOSE: 311 mg/dL — AB (ref 65–99)
POTASSIUM: 3.1 mmol/L — AB (ref 3.5–5.1)
SODIUM: 140 mmol/L (ref 135–145)
TOTAL PROTEIN: 5.2 g/dL — AB (ref 6.5–8.1)

## 2015-09-17 LAB — CBC WITH DIFFERENTIAL/PLATELET
Basophils Absolute: 0 10*3/uL (ref 0.0–0.1)
Basophils Relative: 0 %
EOS PCT: 0 %
Eosinophils Absolute: 0 10*3/uL (ref 0.0–0.7)
HEMATOCRIT: 19.7 % — AB (ref 39.0–52.0)
HEMOGLOBIN: 6.6 g/dL — AB (ref 13.0–17.0)
LYMPHS ABS: 0.7 10*3/uL (ref 0.7–4.0)
LYMPHS PCT: 13 %
MCH: 28.7 pg (ref 26.0–34.0)
MCHC: 33.5 g/dL (ref 30.0–36.0)
MCV: 85.7 fL (ref 78.0–100.0)
MONO ABS: 0.6 10*3/uL (ref 0.1–1.0)
Monocytes Relative: 12 %
NEUTROS ABS: 3.9 10*3/uL (ref 1.7–7.7)
Neutrophils Relative %: 75 %
Platelets: 27 10*3/uL — CL (ref 150–400)
RBC: 2.3 MIL/uL — ABNORMAL LOW (ref 4.22–5.81)
RDW: 15.5 % (ref 11.5–15.5)
WBC: 5.2 10*3/uL (ref 4.0–10.5)

## 2015-09-17 LAB — AMMONIA: Ammonia: 40 umol/L — ABNORMAL HIGH (ref 9–35)

## 2015-09-17 LAB — HEMOGLOBIN AND HEMATOCRIT, BLOOD
HCT: 23.6 % — ABNORMAL LOW (ref 39.0–52.0)
Hemoglobin: 7.9 g/dL — ABNORMAL LOW (ref 13.0–17.0)

## 2015-09-17 LAB — PROTIME-INR
INR: 1.93 — AB (ref 0.00–1.49)
Prothrombin Time: 22 seconds — ABNORMAL HIGH (ref 11.6–15.2)

## 2015-09-17 MED ORDER — SODIUM CHLORIDE 0.9 % IV SOLN
Freq: Once | INTRAVENOUS | Status: AC
  Administered 2015-09-17: 09:00:00 via INTRAVENOUS

## 2015-09-17 MED ORDER — SODIUM CHLORIDE 0.9 % IV SOLN
Freq: Once | INTRAVENOUS | Status: AC
Start: 2015-09-17 — End: 2015-09-17
  Administered 2015-09-17: 17:00:00 via INTRAVENOUS

## 2015-09-17 MED ORDER — ACETAMINOPHEN 325 MG PO TABS: 650.0000 mg | ORAL_TABLET | Freq: Once | ORAL | Status: DC

## 2015-09-17 MED ORDER — OCTREOTIDE ACETATE 100 MCG/ML IJ SOLN
100.0000 ug | Freq: Three times a day (TID) | INTRAMUSCULAR | Status: DC
  Administered 2015-09-17 – 2015-09-20 (×11): 100 ug via SUBCUTANEOUS
  Filled 2015-09-17 (×15): qty 1

## 2015-09-17 MED ORDER — LACTULOSE 10 GM/15ML PO SOLN
30.0000 g | Freq: Three times a day (TID) | ORAL | Status: DC
  Administered 2015-09-17 – 2015-09-19 (×9): 30 g via ORAL
  Filled 2015-09-17 (×10): qty 45

## 2015-09-17 MED ORDER — DIPHENHYDRAMINE HCL 50 MG/ML IJ SOLN: 12.5000 mg | Freq: Once | INTRAMUSCULAR | Status: DC

## 2015-09-17 MED ORDER — POTASSIUM CHLORIDE 10 MEQ/50ML IV SOLN
10.0000 meq | INTRAVENOUS | Status: AC
Start: 2015-09-17 — End: 2015-09-17
  Administered 2015-09-17 (×4): 10 meq via INTRAVENOUS
  Filled 2015-09-17 (×4): qty 50

## 2015-09-17 MED ORDER — INSULIN ASPART 100 UNIT/ML ~~LOC~~ SOLN
0.0000 [IU] | Freq: Three times a day (TID) | SUBCUTANEOUS | Status: DC
  Administered 2015-09-18 (×4): 3 [IU] via SUBCUTANEOUS
  Administered 2015-09-19: 7 [IU] via SUBCUTANEOUS
  Administered 2015-09-19 (×3): 3 [IU] via SUBCUTANEOUS
  Administered 2015-09-20: 5 [IU] via SUBCUTANEOUS
  Administered 2015-09-20: 2 [IU] via SUBCUTANEOUS

## 2015-09-17 MED ORDER — SODIUM CHLORIDE 0.9 % IV SOLN
Freq: Once | INTRAVENOUS | Status: AC
  Administered 2015-09-17: 05:00:00 via INTRAVENOUS

## 2015-09-17 MED ORDER — MIDODRINE HCL 5 MG PO TABS
7.5000 mg | ORAL_TABLET | Freq: Three times a day (TID) | ORAL | Status: DC
  Administered 2015-09-17 – 2015-09-18 (×3): 7.5 mg via ORAL
  Filled 2015-09-17 (×3): qty 2

## 2015-09-17 NOTE — Progress Notes (Signed)
Paged Dr. Grandville Silos regarding potential transfer to [facility].  He gave names of Dr. Loletha Grayer with transplant team and Dr. Ronalee Red with CCM at [facility].  Dr Grandville Silos gave me the phone number to the transfer center at [facility] to verify place on transfer list. We discussed MAP and systolic BP goals which had been changed from original order for Levophed.  Dr. Grandville Silos asked me to page Dr. Joelyn Oms with Nephrology and CCM to verify new, lower MAP/Systolic goals were sufficient to perfuse kidneys. Will follow up with Dr. Joelyn Oms and CCM. [place], [place]

## 2015-09-17 NOTE — Progress Notes (Addendum)
Sent text page to Dr. Pearson Grippe at [phone] per Dr. Biagio Borg request, to verify current BP MAP/Systolic goals are sufficient. [name] C 09/17/2015  Dr. Joelyn Oms returned page.  He said MAP goal of 60 is sufficient.  Will continue to monitor patient's blood pressure. [name] C 4:20 PM

## 2015-09-17 NOTE — Progress Notes (Signed)
CRITICAL VALUE ALERT  Critical value received:  Platelets 22  Date of notification:  09/17/2015  Time of notification:  [id number]  Critical value read back:Yes.    Nurse who received alert:  Gilford Rile, RN  MD notified (1st page):  Ester Rink, RN  Time of first page:  1506  MD notified (2nd page):  Time of second page:  Responding MD:    Time MD responded:  Dr De-dios

## 2015-09-17 NOTE — Progress Notes (Signed)
Admit: 09/14/2015 LOS: 3  69M NASH Cirrhosis (pHTN, +HE) with AoCKD, low U Na, in ICU for NE gtt  Subjective:  Febrile overnight 863mL UOP in past 24h SCr downtrending  BP up well above 120 Feels good this morning  03/12 0701 - 03/13 0700 In: 5837.4 [I.V.:3117.4; Blood:1870; IV Piggyback:850] Out: 1085 [Urine:835; Stool:250]  Filed Weights   09/14/15 2026 09/15/15 0500  Weight: 77.6 kg (171 lb 1.2 oz) 77.6 kg (171 lb 1.2 oz)    Scheduled Meds: . acetaminophen  650 mg Oral Once  . albumin human  25 g Intravenous Q6H  . ampicillin (OMNIPEN) IV  1 g Intravenous Q8H  . antiseptic oral rinse  7 mL Mouth Rinse q12n4p  . chlorhexidine  15 mL Mouth Rinse BID  . diphenhydrAMINE  12.5 mg Intravenous Once  . insulin aspart  0-9 Units Subcutaneous TID WC  . lactulose  30 g Oral TID  . lactulose  300 mL Rectal QID  . pantoprazole (PROTONIX) IV  40 mg Intravenous Q24H  . potassium chloride  10 mEq Intravenous Q1 Hr x 4  . sodium chloride flush  10-40 mL Intracatheter Q12H  . sodium chloride flush  3 mL Intravenous Q12H   Continuous Infusions: . norepinephrine (LEVOPHED) Adult infusion 9 mcg/min (09/17/15 0745)   PRN Meds:.albuterol, alum & mag hydroxide-simeth, ondansetron **OR** ondansetron (ZOFRAN) IV, sodium chloride flush  Current Labs: reviewed   Physical Exam:  Blood pressure 130/77, pulse 87, temperature 99.4 F (37.4 C), temperature source Oral, resp. rate 17, height 5\' 10"  (1.778 m), weight 77.6 kg (171 lb 1.2 oz), SpO2 95 %. nad Abd s/nt, mild distension No LEE CTAB RRR  A 1. AKI likely HRS with low UNa, responseive to NE, improving 2. NASH CIrrhosis; [facility] hepatology follows 3. Hypotension, on NE gtt 4. AMS, hx/o HE on lactulose 5. Fevers 3/12-3/13 6. Anemia 7. TCP 8. 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 Improving, begin transition over from NE gtt to midodrine/octreotide 2. Agree with stopping albumin 3. Agree with transfusion today  Pearson Grippe MD 09/17/2015, 9:18 AM   Recent Labs Lab 09/16/15 0445 09/16/15 1630 09/17/15 0354  NA 140 140 140  K 3.2* 3.8 3.1*  CL 108 103 91*  CO2 22 26 36*  GLUCOSE 144* 198* 311*  BUN 61* 59* 49*  CREATININE 3.41* 3.19* 2.78*  CALCIUM 9.0 9.1 8.2*    Recent Labs Lab 09/14/15 1604 09/15/15 0511  09/16/15 1630 09/17/15 0300 09/17/15 0354  WBC 2.1* 2.9*  < > 4.4 5.2 5.4  NEUTROABS 1.6* 2.3  --   --  3.9  --   HGB 9.1* 9.1*  < > 7.8* 6.6* 6.9*  HCT 26.6* 26.4*  < > 22.9* 19.7* 20.4*  MCV 85.5 85.2  < > 85.8 85.7 86.1  PLT 20* 21*  < > 26* 27* 31*  < > = values in this interval not displayed.

## 2015-09-17 NOTE — Progress Notes (Signed)
Dr Emmit Alexanders with eLink called to follow up with critical platelet value of 22.  Will decide if additional platelet transfusion is necessary. Shared with Dr. Emmit Alexanders information regarding potential transfer to [facility].  Will notify him of any new information regarding this transfer. [place], Talent C 09/17/2015

## 2015-09-17 NOTE — Progress Notes (Signed)
eLink Physician-Brief Progress Note Patient Name: [name] DOB: [birth year] MRN: [id number]   Date of Service  09/17/2015  HPI/Events of Note  Thrombocytopenia - active bleeding from PICC line site.   eICU Interventions  Will transfuse 1 unit single donor platelets.      Intervention Category Intermediate Interventions: Thrombocytopenia - evaluation and management  Lysle Dingwall 09/17/2015, 4:04 PM

## 2015-09-17 NOTE — Progress Notes (Signed)
[facility] Progress Note Patient Name: MCKENZIE NUDD DOB: April 14, 1951 MRN: [id number]    [facility] Physician Progress Note and Electrolyte Replacement  Patient Name: ELIRAN GUEDES DOB: 1951-06-05 MRN: [id number]  Date of Service  09/17/2015   HPI/Events of Note    Recent Labs Lab 09/14/15 2102 09/15/15 0511 09/15/15 1826 09/16/15 0445 09/16/15 1630 09/17/15 0354  NA  --  138 141 140 140 140  K  --  3.6 3.4* 3.2* 3.8 3.1*  CL  --  110 107 108 103 91*  CO2  --  18* 21* 22 26 36*  GLUCOSE  --  190* 105* 144* 198* 311*  BUN  --  60* 61* 61* 59* 49*  CREATININE  --  3.52* 3.54* 3.41* 3.19* 2.78*  CALCIUM  --  8.9 8.9 9.0 9.1 8.2*  MG 2.8*  --   --   --   --   --     Estimated Creatinine Clearance: 28.1 mL/min (by C-G formula based on Cr of 2.78).  Intake/Output      03/12 0701 - 03/13 0700   I.V. (mL/kg) 2849.8 (36.7)   Blood 1535   IV Piggyback 650   Total Intake(mL/kg) 5034.8 (64.9)   Urine (mL/kg/hr) 735 (0.4)   Stool 250 (0.1)   Total Output 985   Net +4049.8       Stool Occurrence 2 x    - I/O DETAILED x 24h    Total I/O In: 2109.2 [I.V.:1214.2; Blood:745; IV Piggyback:150] Out: 700 [Urine:450; Stool:250] - I/O THIS SHIFT    ASSESSMENT Moderate hypokalemia Improving AKi and making urine  eICURN Interventions  kcl x 4 runs Check mag and phos 09/18/15    ASSESSMENT: MAJOR ELECTROLYTE      Dr. Brand Males, M.D., [facility].C.P Pulmonary and Critical Care Medicine Staff Physician [place] [facility] Pager: [phone], If no answer or between  15:00h - 7:00h: call [phone]  09/17/2015 5:02 AM      Intervention Category Intermediate Interventions: Electrolyte abnormality - evaluation and management  Efe Fazzino 09/17/2015, 5:01 AM

## 2015-09-17 NOTE — Progress Notes (Signed)
eLink Physician-Brief Progress Note Patient Name: JADAVEON BECHLER DOB: [birth year] MRN: [id number]   Date of Service  09/17/2015  HPI/Events of Note  Hyperglycemia Diet ordered On SSI qAC only  eICU Interventions  Add HS coverage     Intervention Category Intermediate Interventions: Hyperglycemia - evaluation and treatment Minor Interventions: Routine modifications to care plan (e.g. PRN medications for pain, fever)  Simonne Maffucci 09/17/2015, 11:38 PM

## 2015-09-17 NOTE — Progress Notes (Signed)
Dr. Onnie Graham, MD notified of critical hgb of 6.6 and hct of 19.7; pt. Remains hemodynamically stable at this time; plt level up to 27 from 26 pre transfusion; waiting on orders for blood product administration; will continue to monitor and update healthcare team as necessary.

## 2015-09-17 NOTE — Progress Notes (Signed)
eLink Physician-Brief Progress Note Patient Name: [name] DOB: [birth year] MRN: [id number]   Date of Service  09/17/2015  HPI/Events of Note  Multiple loose stools Skin breakdown  eICU Interventions  Rectal tube     Intervention Category Minor Interventions: Routine modifications to care plan (e.g. PRN medications for pain, fever)  Simonne Maffucci 09/17/2015, 11:34 PM

## 2015-09-17 NOTE — Progress Notes (Signed)
Called transfer center at [facility] [phone]).  Patient is still on transfer list but they currently do not have a MICU bed open to accept transfer. Jamiere Gulas C 4:20 PM

## 2015-09-17 NOTE — Progress Notes (Signed)
PULMONARY / CRITICAL CARE MEDICINE   Name: [name] MRN: [id number] DOB: 12-15-1950    ADMISSION DATE:  09/14/2015 CONSULTATION DATE:  09/16/15   REFERRING MD:  Irine Seal  CHIEF COMPLAINT:  Hepatorenal syndrome  HISTORY OF PRESENT ILLNESS:   [name] is a 65 y/o man with h/o NASH cirrhosis who was admitted on 3/10 for worsening mental status changes.  He was noted to have worsening renal failure which was not improving despite hydration.  He was seen by nephrology and this was felt to most likely be hepatorenal syndrome.  He was transferred to the ICU for levophed to aid in diuresis.   SUBJECTIVE / interval events:  Note improving serum creatinine and urine output 2 units packed red blood cells overnight norepi to 9 Having some epistaxis this morning   VITAL SIGNS: BP 142/85 mmHg  Pulse 93  Temp(Src) 100.2 F (37.9 C) (Oral)  Resp 18  Ht 5\' 10"  (1.778 m)  Wt 77.6 kg (171 lb 1.2 oz)  BMI 24.55 kg/m2  SpO2 95%  HEMODYNAMICS:    VENTILATOR SETTINGS:    INTAKE / OUTPUT: I/O last 3 completed shifts: In: 7694.1 [I.V.:4574.1; Blood:1870; IV Piggyback:1250] Out: [id number] [Urine:1245; Stool:250]  PHYSICAL EXAMINATION: General:  Laying in bed, awake Neuro:  Awake and will interact, poorly oriented and unable to answer simple questions. Moves all extremities HEENT:  PERRL, EOMI, OP clear, very mild epistaxis from left nare Cardiovascular:  NRRR, no mrg Lungs:  CTAB, no wrr Abdomen:  Distended, NT, no palpable HSM Musculoskeletal:  No joint abnormalities Skin:  Multiple bruises on arms.   LABS:  BMET  Recent Labs Lab 09/16/15 0445 09/16/15 1630 09/17/15 0354  NA 140 140 140  K 3.2* 3.8 3.1*  CL 108 103 91*  CO2 22 26 36*  BUN 61* 59* 49*  CREATININE 3.41* 3.19* 2.78*  GLUCOSE 144* 198* 311*    Electrolytes  Recent Labs Lab 09/14/15 2102  09/16/15 0445 09/16/15 1630 09/17/15 0354  CALCIUM  --   < > 9.0 9.1 8.2*  MG 2.8*  --   --   --   --    < > = values in this interval not displayed.  CBC  Recent Labs Lab 09/16/15 1630 09/17/15 0300 09/17/15 0354  WBC 4.4 5.2 5.4  HGB 7.8* 6.6* 6.9*  HCT 22.9* 19.7* 20.4*  PLT 26* 27* 31*    Coag's  Recent Labs Lab 09/15/15 0511 09/16/15 0445 09/17/15 0300  APTT 39*  --   --   INR 1.88* 1.97* 1.93*    Sepsis Markers  Recent Labs Lab 09/14/15 1558 09/15/15 0511  LATICACIDVEN 2.53* 2.0    ABG No results for input(s): PHART, PCO2ART, PO2ART in the last 168 hours.  Liver Enzymes  Recent Labs Lab 09/15/15 0511 09/16/15 0445 09/17/15 0354  AST 27 34 28  ALT 19 18 15*  ALKPHOS 66 58 47  BILITOT 1.4* 2.6* 2.9*  ALBUMIN 3.2* 3.7 3.5    Cardiac Enzymes No results for input(s): TROPONINI, PROBNP in the last 168 hours.  Glucose  Recent Labs Lab 09/16/15 0422 09/16/15 0715 09/16/15 1125 09/16/15 1540 09/16/15 2140 09/17/15 0356  GLUCAP 130* 148* 146* 187* 161* 179*    Imaging No results found.   STUDIES:   ANTIBIOTICS: Ampicillin for known discitis  SIGNIFICANT EVENTS: Transferred to ICU for levophed  LINES/TUBES: PICC 09/15/15  >>   DISCUSSION: 65 y/o man with NASH cirrhosis, hepatic encephalopathy and hepatorenal syndrome.   ASSESSMENT /  PLAN:  PULMONARY A: No acute issues P:   Continuous pulse ox monitoring  CARDIOVASCULAR A:  Shock, possible due to liver disease but note risk for sepsis due to discitis. Also possible contribution volume shifts post-recent paracentesis P:  leveophed - change MAP goal to [id number] and systolic BP 123XX123;  Antibiotics as below  RENAL A:   Possible HRS Metabolic acidosis in setting Acute renal failure P:   Volume challenge initiated with albumin scheduled every 6 hours. He does not respond and this most likely is hepatorenal syndrome. He'll also benefit from blood which was given 3/13 a.m. Albumin for 2 more doses 3/13, then use crystalloid.  Levophed for renal perfusion, wean as able Bicarb gtt  initiated 3/10, will d/c on 3/13 and follow   GASTROINTESTINAL A:   Hepatic encephalopathy NASH with advanced cirrhosis P:   Continue lactulose enemas Attempt to add lactulose by mouth 3/13, unclear that he has the mental status to take adequately Note pt followed [facility] and undergoing transplant evaluation. Transfer to [facility] has been discussed and may occur today 3/13  HEMATOLOGIC A:   thrombocytopenia P:  Transfuse for platelets <10, acute bleeding or procedures  INFECTIOUS A:   Chronic discitis C3-C5, group B strep, planned abx thru April Urine cx 3;/11 >> negative Blood cx 3/13 >>   P:   Ampicillin converted to IV on admission; Broaden antibiotics if he changes clinically Check blood cx today, not done on admission.   ENDOCRINE A:   At risk for hypoglycemia   P:   Monitor glucose on daily labs  NEUROLOGIC A:   Hepatic encephalopathy P:   RASS goal: 0 Avoid any sedating medications Lactulose by mouth and PR   FAMILY  - Updates: wife at bedside 3/12  - Inter-disciplinary family meet or Palliative Care meeting due by:  3/19  Independent CC time 60 minutes  Baltazar Apo, MD, PhD 09/17/2015, 8:41 AM  Pulmonary and Critical Care [phone] or if no answer [phone]

## 2015-09-17 NOTE — Progress Notes (Signed)
eLink Physician-Brief Progress Note Patient Name: [name] DOB: 01/08/1951 MRN: [id number]   Date of Service  09/17/2015  HPI/Events of Note   Recent Labs Lab 09/16/15 0445 09/16/15 1630 09/17/15 0300  HGB 8.5* 7.8* 6.6*  HCT 24.8* 22.9* 19.7*  WBC 3.8* 4.4 5.2  PLT 23* 26* 27*   No overt bleed other than in picc site per RN But  Cirrhosis patient  Looks calm on camera and stable  eICU Interventions  Anemia < 7gm% -> 1 unit prbc     Intervention Category Intermediate Interventions: Communication with other healthcare providers and/or family;Diagnostic test evaluation  [name] 09/17/2015, 3:27 AM

## 2015-09-18 ENCOUNTER — Ambulatory Visit: Payer: Managed Care, Other (non HMO) | Admitting: Internal Medicine

## 2015-09-18 LAB — CBC
HCT: 26.9 % — ABNORMAL LOW (ref 39.0–52.0)
Hemoglobin: 9.3 g/dL — ABNORMAL LOW (ref 13.0–17.0)
MCH: 30 pg (ref 26.0–34.0)
MCHC: 34.6 g/dL (ref 30.0–36.0)
MCV: 86.8 fL (ref 78.0–100.0)
Platelets: 25 10*3/uL — CL (ref 150–400)
RBC: 3.1 MIL/uL — ABNORMAL LOW (ref 4.22–5.81)
RDW: 16 % — AB (ref 11.5–15.5)
WBC: 3.2 10*3/uL — ABNORMAL LOW (ref 4.0–10.5)

## 2015-09-18 LAB — PREPARE FRESH FROZEN PLASMA
Unit division: 0
Unit division: 0

## 2015-09-18 LAB — COMPREHENSIVE METABOLIC PANEL
ALK PHOS: 48 U/L (ref 38–126)
ALT: 14 U/L — AB (ref 17–63)
AST: 26 U/L (ref 15–41)
Albumin: 3.8 g/dL (ref 3.5–5.0)
Anion gap: 12 (ref 5–15)
BILIRUBIN TOTAL: 4.2 mg/dL — AB (ref 0.3–1.2)
BUN: 61 mg/dL — AB (ref 6–20)
CALCIUM: 9.2 mg/dL (ref 8.9–10.3)
CO2: 29 mmol/L (ref 22–32)
CREATININE: 3.01 mg/dL — AB (ref 0.61–1.24)
Chloride: 103 mmol/L (ref 101–111)
GFR, EST AFRICAN AMERICAN: 24 mL/min — AB (ref >60)
GFR, EST NON AFRICAN AMERICAN: 21 mL/min — AB (ref >60)
Glucose, Bld: 257 mg/dL — ABNORMAL HIGH (ref 65–99)
Potassium: 3.7 mmol/L (ref 3.5–5.1)
Sodium: 144 mmol/L (ref 135–145)
TOTAL PROTEIN: 5.7 g/dL — AB (ref 6.5–8.1)

## 2015-09-18 LAB — TYPE AND SCREEN
ABO/RH(D): AB POS
Antibody Screen: NEGATIVE
UNIT DIVISION: 0
UNIT DIVISION: 0
Unit division: 0

## 2015-09-18 LAB — PREPARE PLATELET PHERESIS
UNIT DIVISION: 0
Unit division: 0

## 2015-09-18 LAB — GLUCOSE, CAPILLARY
GLUCOSE-CAPILLARY: 237 mg/dL — AB (ref 65–99)
GLUCOSE-CAPILLARY: 232 mg/dL — AB (ref 65–99)
Glucose-Capillary: 199 mg/dL — ABNORMAL HIGH (ref 65–99)
Glucose-Capillary: 216 mg/dL — ABNORMAL HIGH (ref 65–99)
Glucose-Capillary: 220 mg/dL — ABNORMAL HIGH (ref 65–99)
Glucose-Capillary: 245 mg/dL — ABNORMAL HIGH (ref 65–99)
Glucose-Capillary: 249 mg/dL — ABNORMAL HIGH (ref 65–99)

## 2015-09-18 LAB — MAGNESIUM: MAGNESIUM: 2.3 mg/dL (ref 1.7–2.4)

## 2015-09-18 LAB — PHOSPHORUS: PHOSPHORUS: 3.2 mg/dL (ref 2.5–4.6)

## 2015-09-18 LAB — PATHOLOGIST SMEAR REVIEW

## 2015-09-18 MED ORDER — MIDODRINE HCL 5 MG PO TABS
10.0000 mg | ORAL_TABLET | Freq: Three times a day (TID) | ORAL | Status: DC
  Administered 2015-09-18 – 2015-09-20 (×7): 10 mg via ORAL
  Filled 2015-09-18 (×7): qty 2

## 2015-09-18 NOTE — Progress Notes (Signed)
Admit: 09/14/2015 LOS: 4  48M NASH Cirrhosis (pHTN, +HE) with AoCKD, low U Na, in ICU for NE gtt  Subjective:  NE off, on midodrine/octreotide UOP 569mL SCr up a tad Rec 2u PRBC Albumin 3.8 this AM, off IVB Albumin  03/13 0701 - 03/14 0700 In: 1648.9 [I.V.:399.9; SE:1322124; IV Piggyback:250] Out: 500 [Urine:500]  Filed Weights   09/14/15 2026 09/15/15 0500  Weight: 77.6 kg (171 lb 1.2 oz) 77.6 kg (171 lb 1.2 oz)    Scheduled Meds: . acetaminophen  650 mg Oral Once  . ampicillin (OMNIPEN) IV  1 g Intravenous Q8H  . antiseptic oral rinse  7 mL Mouth Rinse q12n4p  . chlorhexidine  15 mL Mouth Rinse BID  . diphenhydrAMINE  12.5 mg Intravenous Once  . insulin aspart  0-9 Units Subcutaneous TID AC & HS  . lactulose  30 g Oral TID  . midodrine  10 mg Oral TID WC  . octreotide  100 mcg Subcutaneous TID  . pantoprazole (PROTONIX) IV  40 mg Intravenous Q24H  . sodium chloride flush  10-40 mL Intracatheter Q12H  . sodium chloride flush  3 mL Intravenous Q12H   Continuous Infusions: . norepinephrine (LEVOPHED) Adult infusion Stopped (09/17/15 1630)   PRN Meds:.albuterol, alum & mag hydroxide-simeth, ondansetron **OR** ondansetron (ZOFRAN) IV, sodium chloride flush  Current Labs: reviewed   Physical Exam:  Blood pressure 103/64, pulse 53, temperature 98.9 F (37.2 C), temperature source Oral, resp. rate 18, height 5\' 10"  (1.778 m), weight 77.6 kg (171 lb 1.2 oz), SpO2 97 %. nad in chair, awake,alert Abd s/nt, distension No LEE CTAB RRR  A 1. 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 NASH CIrrhosis hx/o ascites, HE; [facility] hepatology follows 3. Hypotension, on NE gtt 4. AMS, hx/o HE on lactulose 5. Fevers 3/12-3/13 B Cx NGTD 6. Anemia s/p 2u PRBC 09/17/15 7. TCP 8. 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 Inc midodrine to 10 TID 2. Hold on paracentesis as would likely worsen renal failure 3. Hold on diuretics as well Daily weights,  Daily Renal Panel, Strict I/Os, Avoid nephrotoxins (NSAIDs, judicious IV Contrast) Pursue [facility] transfer if liver transplant is truly plausible  Pearson Grippe MD 09/18/2015, 10:56 AM   Recent Labs Lab 09/16/15 1630 09/17/15 0354 09/18/15 0647  NA 140 140 144  K 3.8 3.1* 3.7  CL 103 91* 103  CO2 26 36* 29  GLUCOSE 198* 311* 257*  BUN 59* 49* 61*  CREATININE 3.19* 2.78* 3.01*  CALCIUM 9.1 8.2* 9.2  PHOS  --   --  3.2    Recent Labs Lab 09/14/15 1604 09/15/15 0511  09/17/15 0300 09/17/15 0354 09/17/15 0800 09/17/15 1425 09/18/15 0647  WBC 2.1* 2.9*  < > 5.2 5.4  --  5.0 3.2*  NEUTROABS 1.6* 2.3  --  3.9  --   --   --   --   HGB 9.1* 9.1*  < > 6.6* 6.9* 7.9* 9.6* 9.3*  HCT 26.6* 26.4*  < > 19.7* 20.4* 23.6* 29.0* 26.9*  MCV 85.5 85.2  < > 85.7 86.1  --  86.1 86.8  PLT 20* 21*  < > 27* 31*  --  22* 25*  < > = values in this interval not displayed.

## 2015-09-18 NOTE — Progress Notes (Signed)
Physical Therapy Treatment Patient Details Name: [name] MRN: [id number] DOB: 28-Jun-1951 Today's Date: 09/18/2015    History of Present Illness Pt readmitted with hepatic encephalopathy and hepatorenal syndrome. PMH - NASH cirrhosis with frequent admissions, DM, GI bleeds, epidural abscess.    PT Comments    Pt able to better A with mobility today and able to transfer OOB to chair.  Pt cognition improving and participates well.  Noted plan is for pt to transfer to [facility] for further medical management, but feel pt will need continued therapies.    Follow Up Recommendations   (Transfer to [facility])     Equipment Recommendations  None recommended by PT    Recommendations for Other Services       Precautions / Restrictions Precautions Precautions: Fall Restrictions Weight Bearing Restrictions: No    Mobility  Bed Mobility Overal bed mobility: Needs Assistance Bed Mobility: Supine to Sit     Supine to sit: Mod assist;HOB elevated     General bed mobility comments: pt able to initiate mobility and is able to assist with all aspects of bed mobility.    Transfers Overall transfer level: Needs assistance Equipment used: 2 person hand held assist Transfers: Sit to/from [facility] Sit to Stand: Mod assist;+2 physical assistance Stand pivot transfers: Mod assist;Max assist;+2 physical assistance       General transfer comment: pt initially [place] x2 for transfer, but then begins to sit part way through pivot requiring increased A to complete pivot.  pt demonstrates good ability to take pivotal steps  Ambulation/Gait                 Stairs            Wheelchair Mobility    Modified Rankin (Stroke Patients Only)       Balance Overall balance assessment: Needs assistance Sitting-balance support: Bilateral upper extremity supported;Feet supported Sitting balance-Leahy Scale: Poor Sitting balance - Comments: pt relies on Bil UE  support. Postural control: Left lateral lean Standing balance support: During functional activity Standing balance-Leahy Scale: Poor                      Cognition Arousal/Alertness: Lethargic Behavior During Therapy: Flat affect Overall Cognitive Status: Impaired/Different from baseline Area of Impairment: Orientation;Attention;Memory;Problem solving Orientation Level: Disoriented to;Time Current Attention Level: Sustained Memory: Decreased short-term memory       Problem Solving: Slow processing;Decreased initiation;Difficulty sequencing;Requires verbal cues;Requires tactile cues General Comments: pt needs increased time to follow directions, but does follow consistently.  pt with better attention today.      Exercises      General Comments        Pertinent Vitals/Pain Pain Assessment: No/denies pain    Home Living                      Prior Function            PT Goals (current goals can now be found in the care plan section) Acute Rehab PT Goals Patient Stated Goal: wife would like to get him back home PT Goal Formulation: With family Time For Goal Achievement: 09/30/15 Potential to Achieve Goals: Fair Progress towards PT goals: Progressing toward goals    Frequency  Min 3X/week    PT Plan Current plan remains appropriate    Co-evaluation             End of Session Equipment Utilized During Treatment: Gait belt Activity  Tolerance: Patient limited by fatigue Patient left: in chair;with call bell/phone within reach;with chair alarm set;with family/visitor present     Time: [id number] PT Time Calculation (min) (ACUTE ONLY): 19 min  Charges:  $Therapeutic Activity: 8-22 mins                    G CodesCatarina Hartshorn, [place] 09/18/2015, 10:25 AM

## 2015-09-18 NOTE — Progress Notes (Signed)
Initial Nutrition Assessment   INTERVENTION:  -Provide daily snacks  NUTRITION DIAGNOSIS:   Unintentional weight loss related to chronic illness (NASH, liver cirrhosis ) as evidenced by per patient/family report, percent weight loss.  GOAL:   Patient will meet greater than or equal to 90% of their needs  MONITOR:   PO intake, Weight trends, Labs, Skin  REASON FOR ASSESSMENT:   Low Braden  ASSESSMENT:   Pt with history of nonalcoholic steatohepatitis cirrhosis, history of esophageal varices, chronic pancytopenia, chronic kidney disease stage III, type 2 diabetes, depression, protein calorie malnutrition was recently discharged from [facility] on 09/10/2015 for acute hepatic encephalopathy who presents back to the ED with altered mental status.   Pt seen for low Braden. Pt reports a good appetite, observed tray at bedside consumed 90% of meal. PTA pt consumed 3 meals daily. Pt reports feeling full, requested bedtime snack of ice cream. Will provide bedtime snack while admitted.   Pt reports 20 lb weight loss within a month. Per chart weight history shows 16 lb (9%) weight loss within a month. Pt's intake is adequate, suspect weight loss is related to medical conditions.   Conducted nutrition focused physical exam, identified no muscle wasting, no fat wasting,no edema present.   Labs reviewed; CBG 199-256. Medications reviewed.  Diet Order:  Diet heart healthy/carb modified Room service appropriate?: Yes; Fluid consistency:: Thin  Skin:  Wound (see comment) (Stage II PU on scarum )  Last BM:  09/17/2015  Height:   Ht Readings from Last 1 Encounters:  09/14/15 5\' 10"  (1.778 m)    Weight:   Wt Readings from Last 1 Encounters:  09/15/15 171 lb 1.2 oz (77.6 kg)    Ideal Body Weight:  75.4 kg  BMI:  Body mass index is 24.55 kg/(m^2).  Estimated Nutritional Needs:   Kcal:  1800-2100  Protein:  95-105 grams   Fluid:  >/= 2 L   EDUCATION NEEDS:   No  education needs identified at this time  [place], Dietetic Intern Pager: [phone]

## 2015-09-18 NOTE — Progress Notes (Signed)
PULMONARY / CRITICAL CARE MEDICINE   Name: RASAUN BRUTTO MRN: [id number] DOB: 19-Jun-1951    ADMISSION DATE:  09/14/2015 CONSULTATION DATE:  09/16/15   REFERRING MD:  Irine Seal  CHIEF COMPLAINT:  Hepatorenal syndrome  HISTORY OF PRESENT ILLNESS:   Mr. Vandeberg is a 65 y/o man with h/o NASH cirrhosis who was admitted on 3/10 for worsening mental status changes.  He was noted to have worsening renal failure which was not improving despite hydration.  He was seen by nephrology and this was felt to most likely be hepatorenal syndrome.  He was transferred to the ICU for levophed to aid in diuresis.   SUBJECTIVE / interval events:  Note S Cr increased this am, albumin was stopped 3/13, Na bicarb stopped 3/13 Diet started     VITAL SIGNS: Filed Vitals:   09/18/15 0400 09/18/15 0500 09/18/15 0600 09/18/15 0700  BP:  104/68 107/66 107/68  Pulse: 64 58 57 61  Temp: 98.4 F (36.9 C)     TempSrc: Oral     Resp: 13 15 16 13   Height:      Weight:      SpO2: 94% 95% 94% 96%     HEMODYNAMICS:    VENTILATOR SETTINGS:    INTAKE / OUTPUT: I/O last 3 completed shifts: In: 4694.5 [I.V.:2015.5; Blood:1949; Other:130; IV [id number] Out: 1300 [Urine:1050; Stool:250]  PHYSICAL EXAMINATION: General:  Laying in bed, awake Neuro:  Awake and more alert. Interacting normally, no asterixis, moves all ext HEENT:  PERRL, EOMI, OP clear, no epistaxis 3/14 Cardiovascular:  NRRR, no mrg Lungs:  CTAB, no wrr Abdomen:  Distended, NT, no palpable HSM Musculoskeletal:  No joint abnormalities Skin:  Multiple bruises on arms.   LABS:  BMET  Recent Labs Lab 09/16/15 1630 09/17/15 0354 09/18/15 0647  NA 140 140 144  K 3.8 3.1* 3.7  CL 103 91* 103  CO2 26 36* 29  BUN 59* 49* 61*  CREATININE 3.19* 2.78* 3.01*  GLUCOSE 198* 311* 257*    Electrolytes  Recent Labs Lab 09/14/15 2102  09/16/15 1630 09/17/15 0354 09/18/15 0647  CALCIUM  --   < > 9.1 8.2* 9.2  MG 2.8*  --   --    --  2.3  PHOS  --   --   --   --  3.2  < > = values in this interval not displayed.  CBC  Recent Labs Lab 09/17/15 0354 09/17/15 0800 09/17/15 1425 09/18/15 0647  WBC 5.4  --  5.0 3.2*  HGB 6.9* 7.9* 9.6* 9.3*  HCT 20.4* 23.6* 29.0* 26.9*  PLT 31*  --  22* 25*    Coag's  Recent Labs Lab 09/15/15 0511 09/16/15 0445 09/17/15 0300  APTT 39*  --   --   INR 1.88* 1.97* 1.93*    Sepsis Markers  Recent Labs Lab 09/14/15 1558 09/15/15 0511  LATICACIDVEN 2.53* 2.0    ABG No results for input(s): PHART, PCO2ART, PO2ART in the last 168 hours.  Liver Enzymes  Recent Labs Lab 09/16/15 0445 09/17/15 0354 09/18/15 0647  AST 34 28 26  ALT 18 15* 14*  ALKPHOS 58 47 48  BILITOT 2.6* 2.9* 4.2*  ALBUMIN 3.7 3.5 3.8    Cardiac Enzymes No results for input(s): TROPONINI, PROBNP in the last 168 hours.  Glucose  Recent Labs Lab 09/16/15 2140 09/17/15 0356 09/17/15 1230 09/17/15 1625 09/17/15 2330 09/18/15 0600  GLUCAP 161* 179* 256* 238* 199* 237*    Imaging No results  found.   STUDIES:   SIGNIFICANT EVENTS: Transferred to ICU for levophed  LINES/TUBES: PICC 09/15/15  >>   DISCUSSION: 65 y/o man with NASH cirrhosis, hepatic encephalopathy and hepatorenal syndrome.   ASSESSMENT / PLAN:  PULMONARY A: No acute issues P:   Continuous pulse ox monitoring  CARDIOVASCULAR A:  Shock, possible due to liver disease but note risk for sepsis due to discitis. Also possible contribution volume shifts post-recent paracentesis P:  norepi off 3/13 Antibiotics as below Defer paracentesis until later in week given volume-sensitivity  RENAL A:   Possible HRS Metabolic acidosis in setting Acute renal failure P:   Crystalloid resuscitation as indicated Levophed off Bicarb gtt d/c on 3/13 and follow  Appreciate Renal's assistance   GASTROINTESTINAL A:   Hepatic encephalopathy NASH with advanced cirrhosis P:   Lactulose PO Note pt followed [place] and  undergoing transplant evaluation. Transfer to [facility] has been discussed, possible when bed available  HEMATOLOGIC A:   Pancytopenia / thrombocytopenia P:  Transfuse for platelets <10, acute bleeding or procedures  INFECTIOUS A:   Chronic discitis C3-C5, group B strep, planned abx thru April Urine cx 3;/11 >> negative Blood cx 3/13 >>   P:   Ampicillin converted to IV on admission; Broaden antibiotics if he changes clinically, cover SBP  ENDOCRINE A:   At risk for hypoglycemia   P:   SSI protocol   NEUROLOGIC A:   Hepatic encephalopathy, improving 3/14 P:   RASS goal: 0 Avoid any sedating medications Lactulose by mouth    FAMILY  - Updates: wife at bedside 3/12  - Inter-disciplinary family meet or Palliative Care meeting due by:  3/19  Will transfer back to SDU and to [facility] as of 3/15  Baltazar Apo, MD, PhD 09/18/2015, 8:01 AM  Pulmonary and Critical Care [phone] or if no answer [phone]

## 2015-09-18 NOTE — Progress Notes (Signed)
09/18/15 1219  Clinical Encounter Type  Visited With Patient and family together;Health care provider  Visit Type Initial;Spiritual support  Spiritual Encounters  Spiritual Needs Prayer;Emotional  Stress Factors  Patient Stress Factors Exhausted;Loss;Health changes   Chaplain met with patient and patient's family. Chaplain facilitated life review, offered empathic listening, support and prayer. Patient is feeling a bit emotional. He's aware of how critically ill he is, and there seems to be uncertainty about him getting this liver transplant. Patient is also still grieving the recent losses of a good friend/co-worker and his mother. Chaplain support available as needed.   Adam M Barnes, Chaplain 09/18/2015 12:22 PM  

## 2015-09-18 NOTE — Care Management Note (Signed)
Case Management Note  Patient Details  Name: SANAV SPILLMAN MRN: [id number] Date of Birth: 1951/02/20  Subjective/Objective:    Pt admitted on 09/14/15 with hepatic encephalopathy.   PTA, pt resided at home with spouse and was active with [facility] for [facility] [place].                 Action/Plan: Pt followed by [facility] for transplant evaluation.  Pt currently awaiting bed at [facility] for possible liver transplant.  Will follow/assist as needed.    Expected Discharge Date:                  Expected Discharge Plan:  Acute to Acute Transfer  In-House Referral:     Discharge planning Services  CM Consult  Post Acute Care Choice:    Choice offered to:     DME Arranged:    DME Agency:     HH Arranged:    HH Agency:     Status of Service:  In process, will continue to follow  Medicare Important Message Given:    Date Medicare IM Given:    Medicare IM give by:    Date Additional Medicare IM Given:    Additional Medicare Important Message give by:     If discussed at Friend of Stay Meetings, dates discussed:    Additional Comments:  Reinaldo Raddle, RN, BSN  Trauma/Neuro ICU Case Manager [phone]

## 2015-09-19 ENCOUNTER — Ambulatory Visit (HOSPITAL_COMMUNITY): Payer: Managed Care, Other (non HMO)

## 2015-09-19 LAB — CBC
HEMATOCRIT: 28 % — AB (ref 39.0–52.0)
HEMOGLOBIN: 9.3 g/dL — AB (ref 13.0–17.0)
MCH: 29.2 pg (ref 26.0–34.0)
MCHC: 33.2 g/dL (ref 30.0–36.0)
MCV: 88.1 fL (ref 78.0–100.0)
Platelets: 28 10*3/uL — CL (ref 150–400)
RBC: 3.18 MIL/uL — AB (ref 4.22–5.81)
RDW: 15.6 % — ABNORMAL HIGH (ref 11.5–15.5)
WBC: 2.7 10*3/uL — ABNORMAL LOW (ref 4.0–10.5)

## 2015-09-19 LAB — BASIC METABOLIC PANEL
ANION GAP: 12 (ref 5–15)
BUN: 63 mg/dL — ABNORMAL HIGH (ref 6–20)
CALCIUM: 8.9 mg/dL (ref 8.9–10.3)
CO2: 29 mmol/L (ref 22–32)
Chloride: 100 mmol/L — ABNORMAL LOW (ref 101–111)
Creatinine, Ser: 2.96 mg/dL — ABNORMAL HIGH (ref 0.61–1.24)
GFR calc non Af Amer: 21 mL/min — ABNORMAL LOW (ref >60)
GFR, EST AFRICAN AMERICAN: 24 mL/min — AB (ref >60)
Glucose, Bld: 243 mg/dL — ABNORMAL HIGH (ref 65–99)
POTASSIUM: 3.4 mmol/L — AB (ref 3.5–5.1)
Sodium: 141 mmol/L (ref 135–145)

## 2015-09-19 LAB — GLUCOSE, CAPILLARY
GLUCOSE-CAPILLARY: 232 mg/dL — AB (ref 65–99)
Glucose-Capillary: 309 mg/dL — ABNORMAL HIGH (ref 65–99)
Glucose-Capillary: 263 mg/dL — ABNORMAL HIGH (ref 65–99)
Glucose-Capillary: 226 mg/dL — ABNORMAL HIGH (ref 65–99)
Glucose-Capillary: 220 mg/dL — ABNORMAL HIGH (ref 65–99)

## 2015-09-19 MED ORDER — BACLOFEN 10 MG PO TABS
20.0000 mg | ORAL_TABLET | Freq: Every day | ORAL | Status: DC
  Administered 2015-09-19 – 2015-09-20 (×2): 20 mg via ORAL
  Filled 2015-09-19 (×2): qty 2

## 2015-09-19 MED ORDER — TAMSULOSIN HCL 0.4 MG PO CAPS
0.4000 mg | ORAL_CAPSULE | Freq: Two times a day (BID) | ORAL | Status: DC
  Administered 2015-09-19 – 2015-09-20 (×4): 0.4 mg via ORAL
  Filled 2015-09-19 (×4): qty 1

## 2015-09-19 MED ORDER — FLUOXETINE HCL 10 MG PO CAPS
10.0000 mg | ORAL_CAPSULE | Freq: Every morning | ORAL | Status: DC
  Administered 2015-09-19 – 2015-09-20 (×2): 10 mg via ORAL
  Filled 2015-09-19 (×2): qty 1

## 2015-09-19 MED ORDER — MORPHINE SULFATE (PF) 2 MG/ML IV SOLN
1.0000 mg | INTRAVENOUS | Status: DC | PRN
  Administered 2015-09-19: 1 mg via INTRAVENOUS
  Filled 2015-09-19: qty 1

## 2015-09-19 MED ORDER — RIFAXIMIN 550 MG PO TABS
550.0000 mg | ORAL_TABLET | Freq: Two times a day (BID) | ORAL | Status: DC
  Administered 2015-09-19 – 2015-09-20 (×4): 550 mg via ORAL
  Filled 2015-09-19 (×6): qty 1

## 2015-09-19 MED ORDER — LACTULOSE 10 GM/15ML PO SOLN: 30.0000 g | Freq: Three times a day (TID) | ORAL | Status: DC

## 2015-09-19 MED ORDER — PANTOPRAZOLE SODIUM 40 MG PO TBEC
40.0000 mg | DELAYED_RELEASE_TABLET | Freq: Every day | ORAL | Status: DC
  Administered 2015-09-19 – 2015-09-20 (×2): 40 mg via ORAL
  Filled 2015-09-19 (×2): qty 1

## 2015-09-19 MED ORDER — GABAPENTIN 100 MG PO CAPS
100.0000 mg | ORAL_CAPSULE | Freq: Two times a day (BID) | ORAL | Status: DC
  Administered 2015-09-19 – 2015-09-20 (×4): 100 mg via ORAL
  Filled 2015-09-19 (×5): qty 1

## 2015-09-19 MED ORDER — FLUOXETINE HCL 20 MG PO TABS
10.0000 mg | ORAL_TABLET | Freq: Every morning | ORAL | Status: DC
  Filled 2015-09-19: qty 1

## 2015-09-19 MED ORDER — RISAQUAD PO CAPS
2.0000 | ORAL_CAPSULE | Freq: Every day | ORAL | Status: DC
  Administered 2015-09-19 – 2015-09-20 (×2): 2 via ORAL
  Filled 2015-09-19 (×2): qty 2

## 2015-09-19 MED ORDER — AMOXICILLIN 500 MG PO CAPS
500.0000 mg | ORAL_CAPSULE | Freq: Three times a day (TID) | ORAL | Status: DC
  Administered 2015-09-19 – 2015-09-20 (×4): 500 mg via ORAL
  Filled 2015-09-19 (×7): qty 1

## 2015-09-19 MED ORDER — INSULIN ASPART 100 UNIT/ML ~~LOC~~ SOLN
3.0000 [IU] | Freq: Three times a day (TID) | SUBCUTANEOUS | Status: DC
  Administered 2015-09-19 – 2015-09-20 (×5): 3 [IU] via SUBCUTANEOUS

## 2015-09-19 MED ORDER — ADULT MULTIVITAMIN W/MINERALS CH
1.0000 | ORAL_TABLET | Freq: Every day | ORAL | Status: DC
  Administered 2015-09-19 – 2015-09-20 (×2): 1 via ORAL
  Filled 2015-09-19 (×2): qty 1

## 2015-09-19 MED ORDER — GABAPENTIN 100 MG PO CAPS
100.0000 mg | ORAL_CAPSULE | Freq: Three times a day (TID) | ORAL | Status: DC
  Filled 2015-09-19: qty 1

## 2015-09-19 MED ORDER — OXYCODONE HCL 5 MG PO TABS: 5.0000 mg | ORAL_TABLET | Freq: Four times a day (QID) | ORAL | Status: DC | PRN

## 2015-09-19 MED ORDER — BETHANECHOL CHLORIDE 25 MG PO TABS
50.0000 mg | ORAL_TABLET | Freq: Two times a day (BID) | ORAL | Status: DC
  Administered 2015-09-19 – 2015-09-20 (×4): 50 mg via ORAL
  Filled 2015-09-19: qty 5
  Filled 2015-09-19 (×5): qty 2

## 2015-09-19 MED ORDER — POTASSIUM CHLORIDE CRYS ER 20 MEQ PO TBCR
20.0000 meq | EXTENDED_RELEASE_TABLET | Freq: Once | ORAL | Status: AC
  Administered 2015-09-19: 20 meq via ORAL
  Filled 2015-09-19: qty 1

## 2015-09-19 NOTE — Progress Notes (Signed)
Admit: 09/14/2015 LOS: 5  16M NASH Cirrhosis (pHTN, +HE) with AoCKD, low U Na, in ICU for NE gtt  Subjective:  Stable BP on octreotide/midorein UOP 570mL Stable SCr  03/14 0701 - 03/15 0700 In: 200 [IV Piggyback:150] Out: 600 [Urine:550; Stool:50]  Filed Weights   09/14/15 2026 09/15/15 0500  Weight: 77.6 kg (171 lb 1.2 oz) 77.6 kg (171 lb 1.2 oz)    Scheduled Meds: . acidophilus  2 capsule Oral Daily  . amoxicillin  500 mg Oral TID  . antiseptic oral rinse  7 mL Mouth Rinse q12n4p  . baclofen  20 mg Oral QHS  . bethanechol  50 mg Oral BID  . chlorhexidine  15 mL Mouth Rinse BID  . FLUoxetine  10 mg Oral q morning - 10a  . gabapentin  100 mg Oral BID  . insulin aspart  0-9 Units Subcutaneous TID AC & HS  . insulin aspart  3 Units Subcutaneous TID WC  . lactulose  30 g Oral TID  . midodrine  10 mg Oral TID WC  . multivitamin with minerals  1 tablet Oral Daily  . octreotide  100 mcg Subcutaneous TID  . pantoprazole  40 mg Oral Daily  . potassium chloride  20 mEq Oral Once  . rifaximin  550 mg Oral BID  . tamsulosin  0.4 mg Oral BID   Continuous Infusions:   PRN Meds:.albuterol, alum & mag hydroxide-simeth, ondansetron **OR** ondansetron (ZOFRAN) IV  Current Labs: reviewed   Physical Exam:  Blood pressure 108/72, pulse 55, temperature 98.3 F (36.8 C), temperature source Oral, resp. rate 14, height 5\' 10"  (1.778 m), weight 77.6 kg (171 lb 1.2 oz), SpO2 99 %. nad in chair, awake,alert Abd s/nt, distension No LEE CTAB RRR  A 1. 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 NASH CIrrhosis hx/o ascites, HE; [facility] hepatology follows 3. Hypotension, improved as per #1 4. AMS, hx/o HE on lactulose 5. Fevers 3/12-3/13 B Cx NGTD 6. Anemia s/p 2u PRBC 09/17/15 7. TCP related #2 8. Coagulopathy related #2 9. 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 Stable on midodrine/octreotide 2. Hold on paracentesis as  would likely worsen renal failure 3. Hold on diuretics as well, consider starting tomorrow Daily weights, Daily Renal Panel, Strict I/Os, Avoid nephrotoxins (NSAIDs, judicious IV Contrast) Pursue [facility] transfer if liver transplant is truly plausible  Pearson Grippe MD 09/19/2015, 10:35 AM   Recent Labs Lab 09/17/15 0354 09/18/15 0647 09/19/15 0600  NA 140 144 141  K 3.1* 3.7 3.4*  CL 91* 103 100*  CO2 36* 29 29  GLUCOSE 311* 257* 243*  BUN 49* 61* 63*  CREATININE 2.78* 3.01* 2.96*  CALCIUM 8.2* 9.2 8.9  PHOS  --  3.2  --     Recent Labs Lab 09/14/15 1604 09/15/15 0511  09/17/15 0300  09/17/15 1425 09/18/15 0647 09/19/15 0600  WBC 2.1* 2.9*  < > 5.2  < > 5.0 3.2* 2.7*  NEUTROABS 1.6* 2.3  --  3.9  --   --   --   --   HGB 9.1* 9.1*  < > 6.6*  < > 9.6* 9.3* 9.3*  HCT 26.6* 26.4*  < > 19.7*  < > 29.0* 26.9* 28.0*  MCV 85.5 85.2  < > 85.7  < > 86.1 86.8 88.1  PLT 20* 21*  < > 27*  < > 22* 25* 28*  < > = values in this  interval not displayed.

## 2015-09-19 NOTE — Progress Notes (Signed)
[place] TEAM 1 - Stepdown/ICU TEAM PROGRESS NOTE  [name] [id number] DOB: 07-05-1951 DOA: 09/14/2015 PCP: [name], MD  Admit HPI / Brief Narrative: 65 y/o man with h/o NASH cirrhosis who was admitted to [facility] 3/10 for worsening mental status. After his admission he was noted to have worsening renal failure which did not improve despite hydration. He was seen by Nephrology and this was felt to most likely be hepatorenal syndrome. He was transferred to the ICU for levophed.   Significant Events: 3/10 Admit to [facility] for AMS 3/11 PICC line placed  3/12 transfer to ICU - levophed gtt initiated  3/13 levophed stopped   HPI/Subjective: Pt is alert and conversant today.  His wife states he is "about 90% back to normal."  He c/o some chronic pain in his L leg, and asks if we can perform a paracentesis today.  He denies CP, n/v, sob, or abdom pain.    Assessment/Plan:  Shock - distributive/hypovolemic due to liver disease v/s sepsis due to discitis Shock has resolved - no evidence of acute systemic infection   Hepatorenal syndrome - Acute on CKD Stage 3 Avoiding paracentesis as long as possible - Nephrology following - now off pressors and albumin - cont midodrine and octreotide   NASH with advanced cirrhosis - Hepatic encephalopathy followed by [facility] - MELD ~30 today - previously underwent repeated paracenteses every 3 days per [name] (last 3/9 w/ 3L removed) - Dr. Grandville Silos arranged for transfer to [facility], but they reportedly do not have any beds   Pancytopenia / thrombocytopenia Transfused 1U FFP and 1U platelets 3/12 - counts essentially stable at this time   Esophageal Varices  No evidence of bleeding at this time - avoid NG tubes   Chronic discitis C3-C5, group B strep chronic suppressive antibiotics (amoxicillin 500mg  3 times daily) at least through April  Hypokalemia  Gently replace and follow trend   DM2 CBG poorly controlled - adjust tx and  follow   GERD  BPH  Severe protein calorie malnutrition   Code Status: DNR - NO CODE  Family Communication: spoke w/ wife at bedside  Disposition Plan: transfer to [facility] when bed available, or d/c home if pt recovers prior to same   Consultants: PCCM Nephrology   Antibiotics: Ampicillin IV 3/10 >  DVT prophylaxis: SCDs  Objective: Blood pressure 98/59, pulse 52, temperature 98.6 F (37 C), temperature source Oral, resp. rate 13, height 5\' 10"  (1.778 m), weight 77.6 kg (171 lb 1.2 oz), SpO2 92 %.  Intake/Output Summary (Last 24 hours) at 09/19/15 0756 Last data filed at 09/19/15 0600  Gross per 24 hour  Intake    200 ml  Output    600 ml  Net   -400 ml   Exam: General: No acute respiratory distress Lungs: Clear to auscultation bilaterally without wheezes or crackles Cardiovascular: Regular rate and rhythm without murmur gallop or rub normal S1 and S2 Abdomen: protuberant w/ moderate ascites - no rebound, no mass, soft, non-tender  Extremities: No significant cyanosis, clubbing, or edema bilateral lower extremities  Data Reviewed: Basic Metabolic Panel:  Recent Labs Lab 09/14/15 2102  09/16/15 0445 09/16/15 1630 09/17/15 0354 09/18/15 0647 09/19/15 0600  NA  --   < > 140 140 140 144 141  K  --   < > 3.2* 3.8 3.1* 3.7 3.4*  CL  --   < > 108 103 91* 103 100*  CO2  --   < > 22  26 36* 29 29  GLUCOSE  --   < > 144* 198* 311* 257* 243*  BUN  --   < > 61* 59* 49* 61* 63*  CREATININE  --   < > 3.41* 3.19* 2.78* 3.01* 2.96*  CALCIUM  --   < > 9.0 9.1 8.2* 9.2 8.9  MG 2.8*  --   --   --   --  2.3  --   PHOS  --   --   --   --   --  3.2  --   < > = values in this interval not displayed.  CBC:  Recent Labs Lab 09/14/15 1604 09/15/15 0511  09/17/15 0300 09/17/15 0354 09/17/15 0800 09/17/15 1425 09/18/15 0647 09/19/15 0600  WBC 2.1* 2.9*  < > 5.2 5.4  --  5.0 3.2* 2.7*  NEUTROABS 1.6* 2.3  --  3.9  --   --   --   --   --   HGB 9.1* 9.1*  < > 6.6* 6.9* 7.9*  9.6* 9.3* 9.3*  HCT 26.6* 26.4*  < > 19.7* 20.4* 23.6* 29.0* 26.9* 28.0*  MCV 85.5 85.2  < > 85.7 86.1  --  86.1 86.8 88.1  PLT 20* 21*  < > 27* 31*  --  22* 25* 28*  < > = values in this interval not displayed.  Liver Function Tests:  Recent Labs Lab 09/14/15 1604 09/15/15 0511 09/16/15 0445 09/17/15 0354 09/18/15 0647  AST 25 27 34 28 26  ALT 18 19 18  15* 14*  ALKPHOS 75 66 58 47 48  BILITOT 0.9 1.4* 2.6* 2.9* 4.2*  PROT 5.4* 5.2* 5.7* 5.2* 5.7*  ALBUMIN 3.4* 3.2* 3.7 3.5 3.8    Recent Labs Lab 09/14/15 1605 09/15/15 0511 09/16/15 1630 09/17/15 0945  AMMONIA 271* 154* 43* 40*    Coags:  Recent Labs Lab 09/15/15 0511 09/16/15 0445 09/17/15 0300  INR 1.88* 1.97* 1.93*    Recent Labs Lab 09/15/15 0511  APTT 39*   CBG:  Recent Labs Lab 09/18/15 0600 09/18/15 0921 09/18/15 1227 09/18/15 1659 09/18/15 2133  GLUCAP 237* 216* 245* 249* 232*    Recent Results (from the past 240 hour(s))  Culture, body fluid-bottle     Status: None   Collection Time: 09/09/15 12:54 PM  Result Value Ref Range Status   Specimen Description PERITONEAL ABDOMEN  Final   Special Requests NONE  Final   Culture NO GROWTH 5 DAYS  Final   Report Status 09/14/2015 FINAL  Final  Gram stain     Status: None   Collection Time: 09/09/15 12:54 PM  Result Value Ref Range Status   Specimen Description PERITONEAL ABDOMEN  Final   Special Requests NONE  Final   Gram Stain   Final    RARE WBC PRESENT, PREDOMINANTLY MONONUCLEAR NO ORGANISMS SEEN    Report Status 09/09/2015 FINAL  Final  MRSA PCR Screening     Status: None   Collection Time: 09/14/15  8:32 PM  Result Value Ref Range Status   MRSA by PCR NEGATIVE NEGATIVE Final    Comment:        The GeneXpert MRSA Assay (FDA approved for NASAL specimens only), is one component of a comprehensive MRSA colonization surveillance program. It is not intended to diagnose MRSA infection nor to guide or monitor treatment for MRSA  infections.   Culture, Urine     Status: None   Collection Time: 09/15/15 11:51 AM  Result Value Ref Range  Status   Specimen Description URINE, CATHETERIZED  Final   Special Requests NONE  Final   Culture NO GROWTH 1 DAY  Final   Report Status 09/16/2015 FINAL  Final  MRSA PCR Screening     Status: None   Collection Time: 09/15/15 11:00 PM  Result Value Ref Range Status   MRSA by PCR NEGATIVE NEGATIVE Final    Comment:        The GeneXpert MRSA Assay (FDA approved for NASAL specimens only), is one component of a comprehensive MRSA colonization surveillance program. It is not intended to diagnose MRSA infection nor to guide or monitor treatment for MRSA infections.   Culture, blood (Routine X 2) w Reflex to ID Panel     Status: None (Preliminary result)   Collection Time: 09/17/15  3:40 PM  Result Value Ref Range Status   Specimen Description BLOOD LEFT WRIST  Final   Special Requests BOTTLES DRAWN AEROBIC AND ANAEROBIC 5CC  Final   Culture NO GROWTH < 24 HOURS  Final   Report Status PENDING  Incomplete  Culture, blood (Routine X 2) w Reflex to ID Panel     Status: None (Preliminary result)   Collection Time: 09/17/15  3:46 PM  Result Value Ref Range Status   Specimen Description BLOOD LEFT ANTECUBITAL  Final   Special Requests BOTTLES DRAWN AEROBIC AND ANAEROBIC 10CC  Final   Culture NO GROWTH < 24 HOURS  Final   Report Status PENDING  Incomplete     Studies:   Recent x-ray studies have been reviewed in detail by the Attending Physician  Scheduled Meds:  Scheduled Meds: . ampicillin (OMNIPEN) IV  1 g Intravenous Q8H  . antiseptic oral rinse  7 mL Mouth Rinse q12n4p  . chlorhexidine  15 mL Mouth Rinse BID  . diphenhydrAMINE  12.5 mg Intravenous Once  . insulin aspart  0-9 Units Subcutaneous TID AC & HS  . lactulose  30 g Oral TID  . midodrine  10 mg Oral TID WC  . octreotide  100 mcg Subcutaneous TID  . pantoprazole (PROTONIX) IV  40 mg Intravenous Q24H  .  sodium chloride flush  10-40 mL Intracatheter Q12H  . sodium chloride flush  3 mL Intravenous Q12H    Time spent on care of this patient: 35 mins   Peace Noyes T , MD   [facility] Office  [phone] Pager - Text Page per Shea Evans as per below:  On-Call/Text Page:      [email or web address]      password [email or web address]  If 7PM-7AM, please contact night-coverage [email or web address] Password [email or web address] 09/19/2015, 7:56 AM   LOS: 5 days

## 2015-09-19 NOTE — Progress Notes (Signed)
09/19/2015 Patient transfer form 81m to 2c at 1640. Patient was alert, oriented and little slow to response when question ask. He have stage 2 and excoriation to sacrum, skin tear on left ear lobe. Bruising to arms and discoloration to lowers legs, pink area on right knee and callus to bottom of right foot. Patient have very  Distended abdomen because of ascites. He came to unit with flexiseal loose watery stool, foley cath and triple lumen picc with old bloody drainage. [facility] RN.

## 2015-09-19 NOTE — Progress Notes (Signed)
Occupational Therapy Treatment Patient Details Name: [name] MRN: [id number] DOB: [birth year] Today's Date: 09/19/2015    History of present illness Pt readmitted with hepatic encephalopathy and hepatorenal syndrome. PMH - NASH cirrhosis with frequent admissions, DM, GI bleeds, epidural abscess. Waiting on transfer to [facility]--however may get well enough to go home before he transfers   OT comments  This 65 yo male admitted with above presents to acute OT making progress towards goals, depending on how he progresses he may need rehab before home or he may transfers to [facility] before home. He will continue to benefit from acute OT to work towards a more independent level with basic ADLs and associated tranfers.  Follow Up Recommendations  Other (comment) (may transfer to [place] prior to D/C if not then may need so rehab before home)    Equipment Recommendations  None recommended by OT       Precautions / Restrictions Precautions Precautions: Fall Restrictions Weight Bearing Restrictions: No       Mobility Bed Mobility Overal bed mobility: Needs Assistance Bed Mobility: Rolling;Sidelying to Sit Rolling: Min assist Sidelying to sit: Mod assist          Transfers Overall transfer level: Needs assistance Equipment used: 2 person hand held assist Transfers: Sit to/from [facility] Sit to Stand: Mod assist;+2 physical assistance Stand pivot transfers: Mod assist;Max assist;+2 physical assistance            Balance Overall balance assessment: Needs assistance Sitting-balance support: No upper extremity supported;Feet supported Sitting balance-Leahy Scale: Fair                             ADL Overall ADL's : Needs assistance/impaired     Grooming: Set up;Supervision/safety;Wash/dry Sports administrator: Moderate assistance;+2 for physical assistance;Stand-pivot (bil HHA from bedd>recliner going to his left)                              Cognition   Behavior During Therapy: Flat affect Overall Cognitive Status: Impaired/Different from baseline          Following Commands: Follows one step commands consistently (followed 1 step commands 100% of time)                         Pertinent Vitals/ Pain       Pain Assessment: Faces Faces Pain Scale: Hurts little more Pain Location: Left leg just above knee Pain Descriptors / Indicators: Sore Pain Intervention(s): Monitored during session;Repositioned         Frequency Min 2X/week     Progress Toward Goals  OT Goals(current goals can now be found in the care plan section)  Progress towards OT goals: Progressing toward goals     Plan Discharge plan remains appropriate       End of Session Equipment Utilized During Treatment: Gait belt   Activity Tolerance Patient tolerated treatment well   Patient Left in chair;with chair alarm set   Nurse Communication Mobility status        Time: [id number] OT Time Calculation (min): 21 min  Charges: OT General Charges $OT Visit: 1 Procedure OT Treatments $Self Care/Home Management : 8-22 mins  [name] [id number] 09/19/2015, 10:53 AM

## 2015-09-20 ENCOUNTER — Ambulatory Visit (HOSPITAL_COMMUNITY): Payer: Managed Care, Other (non HMO)

## 2015-09-20 DIAGNOSIS — M4642 Discitis, unspecified, cervical region: Secondary | ICD-10-CM | POA: Diagnosis present

## 2015-09-20 DIAGNOSIS — D638 Anemia in other chronic diseases classified elsewhere: Secondary | ICD-10-CM

## 2015-09-20 DIAGNOSIS — E876 Hypokalemia: Secondary | ICD-10-CM

## 2015-09-20 DIAGNOSIS — E1121 Type 2 diabetes mellitus with diabetic nephropathy: Secondary | ICD-10-CM

## 2015-09-20 DIAGNOSIS — R188 Other ascites: Secondary | ICD-10-CM

## 2015-09-20 DIAGNOSIS — K7200 Acute and subacute hepatic failure without coma: Principal | ICD-10-CM

## 2015-09-20 DIAGNOSIS — E43 Unspecified severe protein-calorie malnutrition: Secondary | ICD-10-CM

## 2015-09-20 DIAGNOSIS — F329 Major depressive disorder, single episode, unspecified: Secondary | ICD-10-CM

## 2015-09-20 DIAGNOSIS — I8510 Secondary esophageal varices without bleeding: Secondary | ICD-10-CM

## 2015-09-20 DIAGNOSIS — D696 Thrombocytopenia, unspecified: Secondary | ICD-10-CM

## 2015-09-20 LAB — COMPREHENSIVE METABOLIC PANEL
ALBUMIN: 3.5 g/dL (ref 3.5–5.0)
ALK PHOS: 49 U/L (ref 38–126)
ALT: 13 U/L — AB (ref 17–63)
AST: 22 U/L (ref 15–41)
Anion gap: 10 (ref 5–15)
BILIRUBIN TOTAL: 2.4 mg/dL — AB (ref 0.3–1.2)
BUN: 64 mg/dL — AB (ref 6–20)
CALCIUM: 8.8 mg/dL — AB (ref 8.9–10.3)
CO2: 28 mmol/L (ref 22–32)
CREATININE: 3.05 mg/dL — AB (ref 0.61–1.24)
Chloride: 100 mmol/L — ABNORMAL LOW (ref 101–111)
GFR calc Af Amer: 24 mL/min — ABNORMAL LOW (ref >60)
GFR, EST NON AFRICAN AMERICAN: 20 mL/min — AB (ref >60)
GLUCOSE: 232 mg/dL — AB (ref 65–99)
Potassium: 4 mmol/L (ref 3.5–5.1)
Sodium: 138 mmol/L (ref 135–145)
TOTAL PROTEIN: 5.6 g/dL — AB (ref 6.5–8.1)

## 2015-09-20 LAB — GLUCOSE, CAPILLARY
GLUCOSE-CAPILLARY: 198 mg/dL — AB (ref 65–99)
Glucose-Capillary: 267 mg/dL — ABNORMAL HIGH (ref 65–99)
Glucose-Capillary: 144 mg/dL — ABNORMAL HIGH (ref 65–99)

## 2015-09-20 LAB — CBC
HCT: 29.3 % — ABNORMAL LOW (ref 39.0–52.0)
Hemoglobin: 9.4 g/dL — ABNORMAL LOW (ref 13.0–17.0)
MCH: 28.7 pg (ref 26.0–34.0)
MCHC: 32.1 g/dL (ref 30.0–36.0)
MCV: 89.3 fL (ref 78.0–100.0)
PLATELETS: 28 10*3/uL — AB (ref 150–400)
RBC: 3.28 MIL/uL — ABNORMAL LOW (ref 4.22–5.81)
RDW: 15.2 % (ref 11.5–15.5)
WBC: 2.5 10*3/uL — AB (ref 4.0–10.5)

## 2015-09-20 LAB — PROTIME-INR
INR: 1.75 — AB (ref 0.00–1.49)
Prothrombin Time: 20.4 seconds — ABNORMAL HIGH (ref 11.6–15.2)

## 2015-09-20 MED ORDER — ONDANSETRON HCL 4 MG/2ML IJ SOLN: 4.0000 mg | Freq: Four times a day (QID) | INTRAMUSCULAR | Status: DC | PRN

## 2015-09-20 MED ORDER — INSULIN ASPART 100 UNIT/ML ~~LOC~~ SOLN
0.0000 [IU] | SUBCUTANEOUS | Status: DC
  Administered 2015-09-20: 2 [IU] via SUBCUTANEOUS
  Administered 2015-09-20: 5 [IU] via SUBCUTANEOUS

## 2015-09-20 MED ORDER — INSULIN GLARGINE 100 UNIT/ML ~~LOC~~ SOLN: 5.0000 [IU] | Freq: Every day | SUBCUTANEOUS | Status: DC

## 2015-09-20 MED ORDER — INSULIN GLARGINE 100 UNIT/ML ~~LOC~~ SOLN
5.0000 [IU] | Freq: Every day | SUBCUTANEOUS | Status: DC
  Administered 2015-09-20: 5 [IU] via SUBCUTANEOUS
  Filled 2015-09-20: qty 0.05

## 2015-09-20 MED ORDER — MIDODRINE HCL 10 MG PO TABS: 10.0000 mg | ORAL_TABLET | Freq: Three times a day (TID) | ORAL | Status: DC

## 2015-09-20 MED ORDER — LACTULOSE 10 GM/15ML PO SOLN: 20.0000 g | Freq: Three times a day (TID) | ORAL | Status: DC

## 2015-09-20 MED ORDER — INSULIN ASPART 100 UNIT/ML ~~LOC~~ SOLN: 3.0000 [IU] | Freq: Three times a day (TID) | SUBCUTANEOUS | Status: DC

## 2015-09-20 MED ORDER — LACTULOSE 10 GM/15ML PO SOLN
20.0000 g | Freq: Three times a day (TID) | ORAL | Status: DC
  Administered 2015-09-20 (×2): 20 g via ORAL
  Filled 2015-09-20: qty 30

## 2015-09-20 MED ORDER — OCTREOTIDE ACETATE 100 MCG/ML IJ SOLN: 100.0000 ug | Freq: Three times a day (TID) | INTRAMUSCULAR | Status: DC

## 2015-09-20 MED ORDER — AMOXICILLIN 500 MG PO CAPS
500.0000 mg | ORAL_CAPSULE | Freq: Two times a day (BID) | ORAL | Status: DC
  Administered 2015-09-20: 500 mg via ORAL
  Filled 2015-09-20 (×2): qty 1

## 2015-09-20 MED ORDER — INSULIN ASPART 100 UNIT/ML ~~LOC~~ SOLN: 0.0000 [IU] | SUBCUTANEOUS | Status: DC

## 2015-09-20 NOTE — Progress Notes (Signed)
Ambulance from [facility] arrived. Pt transferred to [facility]acher without complications. Vitals: BP 91/56, HR 65, CBG 142, 96% on Room Air. Personal belongings sent with family. Report called to Jac Canavan at [facility]. PICC line and Foley in place.

## 2015-09-20 NOTE — Progress Notes (Signed)
Admit: 09/14/2015 LOS: 6  52M NASH Cirrhosis (pHTN, +HE) with AoCKD, low U Na, in ICU for NE gtt  Subjective:  Abd distended but not overtly bothersome Labs and BP stable No new events Adequate UOP   03/15 0701 - 03/16 0700 In: 240 [P.O.:240] Out: 600 [Urine:600]  Filed Weights   09/15/15 0500 09/19/15 1708 09/20/15 0500  Weight: 77.6 kg (171 lb 1.2 oz) 89.3 kg (196 lb 13.9 oz) 90.2 kg (198 lb 13.7 oz)    Scheduled Meds: . acidophilus  2 capsule Oral Daily  . amoxicillin  500 mg Oral TID  . antiseptic oral rinse  7 mL Mouth Rinse q12n4p  . baclofen  20 mg Oral QHS  . bethanechol  50 mg Oral BID  . chlorhexidine  15 mL Mouth Rinse BID  . FLUoxetine  10 mg Oral q morning - 10a  . gabapentin  100 mg Oral BID  . insulin aspart  0-9 Units Subcutaneous TID AC & HS  . insulin aspart  3 Units Subcutaneous TID WC  . lactulose  30 g Oral TID  . midodrine  10 mg Oral TID WC  . multivitamin with minerals  1 tablet Oral Daily  . octreotide  100 mcg Subcutaneous TID  . pantoprazole  40 mg Oral Daily  . rifaximin  550 mg Oral BID  . tamsulosin  0.4 mg Oral BID   Continuous Infusions:   PRN Meds:.albuterol, alum & mag hydroxide-simeth, morphine injection, ondansetron **OR** ondansetron (ZOFRAN) IV, oxyCODONE  Current Labs: reviewed   Physical Exam:  Blood pressure 98/68, pulse 53, temperature 98.3 F (36.8 C), temperature source Oral, resp. rate 11, height 5\' 10"  (1.778 m), weight 90.2 kg (198 lb 13.7 oz), SpO2 91 %. nad in chair, awake,alert Abd s/nt, distension No LEE CTAB RRR  A 1. 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 NASH CIrrhosis hx/o ascites, HE; [facility] hepatology follows 3. Hypotension, improved as per #1 4. AMS, hx/o HE on lactulose 5. Fevers 3/12-3/13 B Cx NGTD 6. Anemia s/p 2u PRBC 09/17/15 7. TCP related #2 8. Coagulopathy related #2 9. 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 Stable on  midodrine/octreotide 2. Hold on paracentesis as would likely worsen renal failure 3. Hold on diuretics given ascites not terribly bothersome Daily weights, Daily Renal Panel, Strict I/Os, Avoid nephrotoxins (NSAIDs, judicious IV Contrast) Pursue [facility] transfer if liver transplant is truly plausible; likely only durable solution here  Pearson Grippe MD 09/20/2015, 9:07 AM   Recent Labs Lab 09/18/15 0647 09/19/15 0600 09/20/15 0540  NA 144 141 138  K 3.7 3.4* 4.0  CL 103 100* 100*  CO2 29 29 28   GLUCOSE 257* 243* 232*  BUN 61* 63* 64*  CREATININE 3.01* 2.96* 3.05*  CALCIUM 9.2 8.9 8.8*  PHOS 3.2  --   --     Recent Labs Lab 09/14/15 1604 09/15/15 0511  09/17/15 0300  09/18/15 0647 09/19/15 0600 09/20/15 0540  WBC 2.1* 2.9*  < > 5.2  < > 3.2* 2.7* 2.5*  NEUTROABS 1.6* 2.3  --  3.9  --   --   --   --   HGB 9.1* 9.1*  < > 6.6*  < > 9.3* 9.3* 9.4*  HCT 26.6* 26.4*  < > 19.7*  < > 26.9* 28.0* 29.3*  MCV 85.5 85.2  < > 85.7  < > 86.8 88.1 89.3  PLT 20* 21*  < > 27*  < >  25* 28* 28*  < > = values in this interval not displayed.

## 2015-09-20 NOTE — Progress Notes (Signed)
All arrangements have been made to transfer pt to [facility] - awaiting a bed assignment from Patient coordinator Verl Blalock - # [phone]

## 2015-09-20 NOTE — Care Management Note (Signed)
Case Management Note  Patient Details  Name: [name] MRN: [id number] Date of Birth: 1951/01/19  Subjective/Objective:     Pt to be transported to [facility] received TC from [facility] @ [phone]) requesting return call if bed was available and pt needed ambulance transportation.  When information was available, CM made repeated calls to number provided by [facility].  According to IT and Customer Service Depts @ that number, the telephone system is not working.  CM also called the number provided for [employer] in [facility] and after numerous tries, was unable to reach anyone.                              Expected Discharge Plan:  Acute to Acute Transfer  Discharge planning Services  CM Consult  Status of Service:  Completed, signed off  Girard Cooter, [place] 09/20/2015, 4:09 PM

## 2015-09-20 NOTE — Discharge Summary (Signed)
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  -Nephrology following - now off pressors and albumin  -cont midodrine 10 mg TID -Octreotide 100 g TID  NASH with advanced cirrhosis - Hepatic encephalopathy -followed by [facility] - MELD ~30 today - previously underwent repeated paracenteses every 3 days per [name] (last 3/9 w/ 3L removed)  - Dr. Grandville Silos arranged for transfer to [facility], and they initially did not have beds. However today received a call that beds are now available. Spoke with patient and his wife and they both would like to proceed with transfer to [facility] where his hepatologist has been seeing him. -Rifaximin 550 mg BID -Lactulose 20 gm TID   Pancytopenia / thrombocytopenia -3/12 Transfused 1U FFP  -3/12 transfused 1U platelets  -counts essentially stable at this time   Esophageal Varices  -No evidence of bleeding at this time - avoid NG tubes   Chronic discitis C3-C5, group B strep -chronic suppressive antibiotics (amoxicillin 500mg  3 times daily) at least through April  Hypokalemia  -Gently replace and follow trend   DM Type 2 controlled with complications -CBG poorly controlled  -Moderate SSI -Lantus 5 units daily -NovoLog 3 units QAC  BPH -Flomax 0.4 mg BID  Severe protein calorie malnutrition  -Will be addressed by hepatologist at [facility]    Discharge Diagnoses:   Principal Problem:   Hepatic encephalopathy ([place]) Active Problems:   Thrombocytopenia ([place])   Acute hepatic encephalopathy (HCC)   Ascites   Acute renal failure superimposed on stage 3 chronic kidney disease (HCC)   Anemia of chronic disease   Depression   Controlled type 2 diabetes mellitus with diabetic nephropathy, without long-term current use of insulin (HCC)   Diabetic neuropathy (HCC)   Coagulopathy (HCC)   NASH (nonalcoholic steatohepatitis)   Protein-calorie malnutrition, severe   Secondary biliary cirrhosis (HCC)   Portal hypertension (HCC)   Type 2 diabetes mellitus (HCC)   Pancytopenia (HCC)   Acidosis, metabolic   Hypokalemia   Secondary esophageal varices without bleeding ([place])   Discitis of cervical region   Severe protein-calorie malnutrition ([place])   Discharge Condition: Stable  Diet recommendation: Heart healthy /carb modified  Filed Weights   09/15/15 0500 09/19/15 1708 09/20/15 0500  Weight: 77.6 kg (171 lb 1.2 oz) 89.3 kg (196 lb 13.9 oz) 90.2 kg (198 lb 13.7 oz)    History of present illness:  65 y/o WM PMHx Depression NASH cirrhosis, Esophageal Varices, Chronic Pancytopenia, CKD stageIII, DM Type 2, Protein Calorie malnutrition  Patient had paracentesis done one day prior to admission with 3 L of fluid removed. Patient has been getting paracentesis approximately every 3 days. Patient's wife states that patient has been compliant with his lactulose and had been having about 3 bowel movements on a daily basis. Patient's wife stated that patient usually gets lethargic post thoracentesis. On the morning of admission patient's wife stated that she noted that patient had a bout of nonbloody emesis around 3 AM on the day of admission  and noted to be more lethargic and sleepy to the point whereby he was more confused and significantly weak that he was unable to ambulate. Patient's wife states that patient remains cold and does have an electric blanket. Patient's  wife denies any fevers, no cough, no chest pain, no shortness of breath, no abdominal pain, no constipation, no melena, no hematemesis, no hematochezia. Patient's wife does endorse that patient has had decreased urinary output over the past 24-48 hours. Patient was seen in the emergency room, comprehensive metabolic profile obtained had a bicarbonate of 14 BUN of 59 creatinine of 3.37 glucose of 298 abdomen of 3.4 otherwise was within normal limits. Ammonia level was 271. Lactic acid level was 2.53. CBC had a white count of 2.1 hemoglobin of 9.1 platelet count of 20. Urinalysis pending. Chest x-ray pending. A lactulose enema was ordered. ED PA. During this admission patient's blood cultures/urine cultures have been negative. Peritoneal fluid which was removed a day prior to admission does not appear to have been sent for culture and stain. However fluid did show straw-colored fluid, WBC 39, lymphocytes 23, eosinophils 1, hazy appearance neutrophil count 5, monocytes 71 (ascites fluid studies NOT  c/w SBP). Of note patient has been on amoxicillin since 06/2015 for chronic discitis. Prior to this admission patient had been discharged on 09/10/2015 for the same diagnosis (acute hepatic encephalopathy). Secondary to patient's worsening renal status he was seen by nephrology, per their note patient's renal function has been declining since July 2016 (Cr 1.5--> 3.05) likely secondary to Hepatorenal syndrome. Patient became hypotensive and was transferred to ICU and started on Levophed drip in order to maintain MAP> 75 and systolic BP [id number] and a and diuresis. During his ICU stay patient also received daily albumin which was stopped on 3/13 as well as sodium bicarbonate which was also stopped on 3/13. In addition patient was registered 3 units PRBC, 2 units FFP, 3 units platelets, Secondary to anemia and thrombocytopenia. No active site of bleeding determined. Patient's cognition has slowly improved and is close to baseline  per wife. Both patient and wife request to be transferred to [facility] for definitive care by his hepatologist (per them patient on liver transplant list).     Procedures: 3/14 transfuse 3 units PRBC 3/13 transfuse 1 unit FFP 3/14 transfuse 1 unit FFP 3/12 transfused 2 units platelets 3/13 transfused 1 unit platelets    Consultations: Dr. Collene Gobble PCCM Dr. Rexene Agent Nephrology   Cultures 3/10 MRSA by PCR negative 3/11 urine negative final 3/13-lead left wrist/AC NGTD   Antibiotics Amoxicillin 06/2015 >>   LINES/TUBES PICC 09/15/15      Discharge Exam: Filed Vitals:   09/20/15 0500 09/20/15 0809 09/20/15 1226 09/20/15 1600  BP:  98/68 104/71 100/73  Pulse:  53    Temp:  98.3 F (36.8 C) 98.3 F (36.8 C) 98.2 F (36.8 C)  TempSrc:  Oral Oral Oral  Resp:  11 11 12   Height:      Weight: 90.2 kg (198 lb 13.7 oz)     SpO2:  91% 96% 95%    General: A/O 4, uncomfortable secondary to ascites, negative acute respiratory distress Cardiovascular: Regular rhythm and rate, negative murmurs rubs or gallops, normal S1/S2 Respiratory: Clear to auscultation bilateral Abdomen: Firm, distended, non-painful to palpation, positive bowel sounds    Discharge Instructions     Medication List    STOP taking these medications        Insulin Degludec 200 UNIT/ML Sopn  Commonly known as:  TRESIBA FLEXTOUCH      TAKE these medications        acidophilus Caps capsule  Take 2 capsules by mouth daily.     amoxicillin 500 MG capsule  Commonly known as:  AMOXIL  Take 1 capsule (500 mg total) by mouth 3 (three) times daily.     baclofen 20 MG tablet  Commonly known as:  LIORESAL  Take 1 tablet (20 mg total) by mouth at bedtime.     bethanechol 50 MG tablet  Commonly known as:  URECHOLINE  Take 1 tablet by mouth 2 (two) times daily.     feeding supplement (PRO-STAT SUGAR FREE 64) Liqd  Take 30 mLs by mouth 3 (three) times daily.     FLUoxetine 10 MG tablet   Commonly known as:  PROZAC  Take 10 mg by mouth every morning.     gabapentin 100 MG capsule  Commonly known as:  NEURONTIN  Take 100 mg by mouth 3 (three) times daily.     insulin aspart 100 UNIT/ML injection  Commonly known as:  novoLOG  Inject 0-15 Units into the skin every 4 (four) hours.     insulin aspart 100 UNIT/ML injection  Commonly known as:  novoLOG  Inject 3 Units into the skin 3 (three) times daily with meals.     insulin glargine 100 UNIT/ML injection  Commonly known as:  LANTUS  Inject 0.05 mLs (5 Units total) into the skin daily.     lactulose 10 GM/15ML solution  Commonly known as:  CHRONULAC  Take 30 mLs (20 g total) by mouth 3 (three) times daily.     midodrine 10 MG tablet  Commonly known as:  PROAMATINE  Take 1 tablet (10 mg total) by mouth 3 (three) times daily with meals.     multivitamin with minerals Tabs tablet  Take 1 tablet by mouth daily.     octreotide 100 MCG/ML Soln injection  Commonly known as:  SANDOSTATIN  Inject 1 mL (100 mcg total) into the skin 3 (three) times daily.     ondansetron 4 MG/2ML Soln injection  Commonly known as:  ZOFRAN  Inject 2 mLs (4 mg total) into the vein every 6 (six) hours as needed for nausea.     pantoprazole 40 MG tablet  Commonly known as:  PROTONIX  TAKE 1 TABLET (40 MG TOTAL) BY MOUTH DAILY.     tamsulosin 0.4 MG Caps capsule  Commonly known as:  FLOMAX  Take 1 capsule by mouth 2 (two) times daily.     Vitamin D (Ergocalciferol) 50000 units Caps capsule  Commonly known as:  DRISDOL  Take 50,000 Units by mouth every 7 (seven) days. Mondays.     XIFAXAN 550 MG Tabs tablet  Generic drug:  rifaximin  TAKE 1 TABLET (550 MG TOTAL) BY MOUTH 2 (TWO) TIMES DAILY.       No Known Allergies    The results of significant diagnostics from this hospitalization (including imaging, microbiology, ancillary and laboratory) are listed below for reference.    Significant Diagnostic Studies: Dg Chest 2  View  08/27/2015  CLINICAL DATA:  Cirrhosis, altered mental status EXAM: CHEST  2 VIEW COMPARISON:  08/22/2015 FINDINGS: Cardiomediastinal silhouette is stable. There is elevation of the right hemidiaphragm. No infiltrate or pulmonary edema. Mild basilar atelectasis. IMPRESSION: Cardiomegaly. Elevation of the right hemidiaphragm. Mild basilar atelectasis. No pulmonary edema. Electronically Signed   By: Lahoma Crocker M.D.   On: 08/27/2015 16:19  Dg Chest 2 View  08/22/2015  CLINICAL DATA:  Cough/chills/left anterior cp x  26mo. Hx of DM. EXAM: CHEST  2 VIEW COMPARISON:  Multiple priors, including 08/04/2015 FINDINGS: Lateral view degraded by patient arm position. Midline trachea. Mild cardiomegaly. No pleural effusion or pneumothorax. Reverse apical lordotic positioning. Prominent first anterior ribs, worse on the right. Similar. Clear lungs. IMPRESSION: No acute cardiopulmonary disease. Mild cardiomegaly, without congestive failure. Electronically Signed   By: Abigail Miyamoto M.D.   On: 08/22/2015 16:06   US Abdomen Limited  08/27/2015  CLINICAL DATA:  Evaluate ascites for possible paracentesis EXAM: LIMITED ABDOMEN ULTRASOUND FOR ASCITES TECHNIQUE: Limited ultrasound survey for ascites was performed in all four abdominal quadrants. COMPARISON:  Abdominal ultrasound of August 23, 2015 FINDINGS: The patient was noted be quite lethargic and confused and precluded paracentesis. A moderate volume of fluid is seen in all quadrants of the abdomen. IMPRESSION: There is a moderate volume of ascites present. Ascites could not be performed due to the patient's clinical condition as perched [name], PAC. Electronically Signed   By: David  Martinique M.D.   On: 08/27/2015 15:35   US Paracentesis  09/13/2015  INDICATION: Abdominal distention secondary to recurrent ascites. Request diagnostic and therapeutic paracentesis of up to 3 L max. EXAM: ULTRASOUND GUIDED LEFT LOWER QUADRANT PARACENTESIS MEDICATIONS: None.  COMPLICATIONS: None immediate. PROCEDURE: Informed written consent was obtained from the patient after a discussion of the risks, benefits and alternatives to treatment. A timeout was performed prior to the initiation of the procedure. Initial ultrasound scanning demonstrates a large amount of ascites within the right lower abdominal quadrant. The right lower abdomen was prepped and draped in the usual sterile fashion. 1% lidocaine with epinephrine was used for local anesthesia. Following this, a 7 cm, 19 gauge, Yueh catheter was introduced. An ultrasound image was saved for documentation purposes. The paracentesis was performed. The catheter was removed and a dressing was applied. The patient tolerated the procedure well without immediate post procedural complication. FINDINGS: A total of approximately 3 L of clear, amber colored fluid was removed. Samples were sent to the laboratory as requested by the clinical team. IMPRESSION: Successful ultrasound-guided paracentesis yielding 3 liters of peritoneal fluid. Read by: Ascencion Dike PA-C Electronically Signed   By: Corrie Mckusick D.O.   On: 09/13/2015 14:28   US Paracentesis  09/09/2015  INDICATION: NASH cirrhosis. Abdominal distention secondary to recurrent ascites. Request diagnostic and therapeutic paracentesis of up to 5 L max. EXAM: ULTRASOUND GUIDED RIGHT LOWER QUADRANT PARACENTESIS MEDICATIONS: None. COMPLICATIONS: None immediate. PROCEDURE: Informed written consent was obtained from the patient after a discussion of the risks, benefits and alternatives to treatment. A timeout was performed prior to the initiation of the procedure. Initial ultrasound scanning demonstrates a large amount of ascites within the right lower abdominal quadrant. The right lower abdomen was prepped and draped in the usual sterile fashion. 1% lidocaine with epinephrine was used for local anesthesia. Following this, a 7 cm, 19 gauge, Yueh catheter was introduced. An ultrasound image  was saved for documentation purposes. The paracentesis was performed. The catheter was removed and a dressing was applied. The patient tolerated the procedure well without immediate post procedural complication. FINDINGS: A total of approximately 5 L of clear yellow fluid was removed. Samples were sent to the laboratory as requested by the clinical team. IMPRESSION: Successful ultrasound-guided paracentesis yielding 5 liters of peritoneal fluid. Read by: Ascencion Dike PA-C Electronically Signed   By: Eden Emms.D.  On: 09/09/2015 11:45   US Paracentesis  09/07/2015  INDICATION: Recurrent ascites secondary to NASH. Request made for diagnostic and therapeutic paracentesis. EXAM: ULTRASOUND GUIDED DIAGNOSTIC AND THERAPEUTIC PARACENTESIS MEDICATIONS: 1% lidocaine COMPLICATIONS: None immediate. PROCEDURE: Informed written consent was obtained from the patient after a discussion of the risks, benefits and alternatives to treatment. A timeout was performed prior to the initiation of the procedure. Initial ultrasound scanning demonstrates a large amount of ascites within the left lower abdominal quadrant. The left lower abdomen was prepped and draped in the usual sterile fashion. 1% lidocaine was used for local anesthesia. Following this, a Safe-T-Centesis catheter was introduced. The paracentesis was performed. The catheter was removed and a dressing was applied. The patient tolerated the procedure well without immediate post procedural complication. FINDINGS: A total of approximately 3.5 L of clear yellow fluid was removed. Samples were sent to the laboratory as requested by the clinical team. IMPRESSION: Successful ultrasound-guided paracentesis yielding 3.5 liters of peritoneal fluid. Read by: [name], PA-C Electronically Signed   By: Lucrezia Europe M.D.   On: 09/07/2015 10:12   US Paracentesis  09/03/2015  INDICATION: Ascites secondary to cirrhosis of the liver. Request made for diagnostic and therapeutic  paracentesis but the 3 L maximum. EXAM: ULTRASOUND GUIDED DIAGNOSTIC AND THERAPEUTIC PARACENTESIS MEDICATIONS: 1% lidocaine COMPLICATIONS: None immediate. PROCEDURE: Informed written consent was obtained from the patient after a discussion of the risks, benefits and alternatives to treatment. A timeout was performed prior to the initiation of the procedure. Initial ultrasound scanning demonstrates a large amount of ascites within the left lower abdominal quadrant. The left lower abdomen was prepped and draped in the usual sterile fashion. 1% lidocaine was used for local anesthesia. Following this, a 6 Fr Safe-T-Centesis catheter was introduced. The paracentesis was performed. The catheter was removed and a dressing was applied. The patient tolerated the procedure well without immediate post procedural complication. FINDINGS: A total of approximately 3 L of clear yellow fluid was removed. Samples were sent to the laboratory as requested by the clinical team. IMPRESSION: Successful ultrasound-guided paracentesis yielding 3 liters of peritoneal fluid. The patient received 25 g of albumin and short stay. Read by: [name], PA-C Electronically Signed   By: Jerilynn Mages.  Shick M.D.   On: 09/03/2015 15:08   US Paracentesis  08/30/2015  INDICATION: Abdominal distention secondary recurrent ascites. Request therapeutic paracentesis of up to 3 L max. EXAM: ULTRASOUND GUIDED RIGHT LOWER QUADRANT PARACENTESIS MEDICATIONS: None. COMPLICATIONS: None immediate. PROCEDURE: Informed written consent was obtained from the patient after a discussion of the risks, benefits and alternatives to treatment. A timeout was performed prior to the initiation of the procedure. Initial ultrasound scanning demonstrates a large amount of ascites within the right lower abdominal quadrant. The right lower abdomen was prepped and draped in the usual sterile fashion. 1% lidocaine with epinephrine was used for local anesthesia. Following this, a  Safe-T-Centesis catheter was introduced. An ultrasound image was saved for documentation purposes. The paracentesis was performed. The catheter was removed and a dressing was applied. The patient tolerated the procedure well without immediate post procedural complication. FINDINGS: A total of approximately 3 L of clear yellow fluid was removed. IMPRESSION: Successful ultrasound-guided paracentesis yielding 3 liters of peritoneal fluid. Read by: Ascencion Dike PA-C Electronically Signed   By: Lucrezia Europe M.D.   On: 08/30/2015 14:59   US Paracentesis  08/28/2015  INDICATION: 65 year old male with a history of ascites. Multiple prior paracentesis. Request for no more than 3.0 L  aspiration. EXAM: ULTRASOUND GUIDED  PARACENTESIS MEDICATIONS: None. COMPLICATIONS: None PROCEDURE: Informed written consent was obtained from the patient after a discussion of the risks, benefits and alternatives to treatment. A timeout was performed prior to the initiation of the procedure. Initial ultrasound scanning demonstrates a large amount of ascites within the right lower abdominal quadrant. The right lower abdomen was prepped and draped in the usual sterile fashion. 1% lidocaine with epinephrine was used for local anesthesia. Following this, a yueh catheter was introduced. An ultrasound image was saved for documentation purposes. The paracentesis was performed. The catheter was removed and a dressing was applied. The patient tolerated the procedure well without immediate post procedural complication. FINDINGS: A total of approximately 3.0 L of thin yellow fluid was removed. Samples were sent to the laboratory as requested by the clinical team. IMPRESSION: Status post ultrasound-guided paracentesis with 3.0 L fluid removed. Sample sent to the lab for analysis. Signed, Dulcy Fanny. Earleen Newport, DO [facility] Electronically Signed   By: Corrie Mckusick D.O.   On: 08/28/2015 10:25   US  Paracentesis  08/23/2015  INDICATION: NASH, chronic renal insufficiency. Progressive hepatic failure with encephalopathy. Request for diagnostic and therapeutic paracentesis of up to 3 L max. EXAM: ULTRASOUND GUIDED LEFT LOWER QUADRANT PARACENTESIS MEDICATIONS: None. COMPLICATIONS: None immediate. PROCEDURE: Informed written consent was obtained from the patient after a discussion of the risks, benefits and alternatives to treatment. A timeout was performed prior to the initiation of the procedure. Initial ultrasound scanning demonstrates a large amount of ascites within the right lower abdominal quadrant. The right lower abdomen was prepped and draped in the usual sterile fashion. 1% lidocaine with epinephrine was used for local anesthesia. Following this, a 19 gauge, 7 cm Yueh catheter was introduced. An ultrasound image was saved for documentation purposes. The paracentesis was performed. The catheter was removed and a dressing was applied. The patient tolerated the procedure well without immediate post procedural complication. FINDINGS: A total of approximately 3 L of hazy yellow fluid was removed. Samples were sent to the laboratory as requested by the clinical team. IMPRESSION: Successful ultrasound-guided paracentesis yielding 3 liters of peritoneal fluid. Read by: Ascencion Dike PA-C Electronically Signed   By: Markus Daft M.D.   On: 08/23/2015 14:51   Dg Chest Portable 1 View  09/14/2015  CLINICAL DATA:  Altered level of consciousness.  Cirrhosis. EXAM: PORTABLE CHEST 1 VIEW COMPARISON:  09/06/2015 and prior radiographs FINDINGS: This is a low volume study. The cardiomediastinal silhouette and peribronchial thickening are unchanged. No large pleural effusion or pneumothorax identified. No acute bony abnormality identified. IMPRESSION: Unchanged appearance of the chest with low volumes and peribronchial thickening. Electronically Signed   By: Margarette Canada M.D.   On: 09/14/2015 19:29   Dg Chest Port 1  View  09/06/2015  CLINICAL DATA:  Weakness, shortness of breath today, hypotension, history of hypertension, end-stage liver disease, acute on chronic renal failure, esophageal varices, type II diabetes mellitus, secondary biliary cirrhosis EXAM: PORTABLE CHEST 1 VIEW COMPARISON:  Portable exam 1331 hours compared to 08/27/2015 FINDINGS: Very low lung volumes. Enlargement of cardiac silhouette. Mediastinal contours normal. Bibasilar atelectasis and minimal central peribronchial thickening. No gross infiltrate, pleural effusion or pneumothorax. Bones demineralized. IMPRESSION: Enlargement of cardiac silhouette. Bronchitic changes with very low lung volumes and bibasilar atelectasis. Electronically Signed   By: Lavonia Dana M.D.   On: 09/06/2015 13:47    Microbiology: Recent Results (from the past 240 hour(s))  MRSA PCR  Screening     Status: None   Collection Time: 09/14/15  8:32 PM  Result Value Ref Range Status   MRSA by PCR NEGATIVE NEGATIVE Final    Comment:        The GeneXpert MRSA Assay (FDA approved for NASAL specimens only), is one component of a comprehensive MRSA colonization surveillance program. It is not intended to diagnose MRSA infection nor to guide or monitor treatment for MRSA infections.   Culture, Urine     Status: None   Collection Time: 09/15/15 11:51 AM  Result Value Ref Range Status   Specimen Description URINE, CATHETERIZED  Final   Special Requests NONE  Final   Culture NO GROWTH 1 DAY  Final   Report Status 09/16/2015 FINAL  Final  MRSA PCR Screening     Status: None   Collection Time: 09/15/15 11:00 PM  Result Value Ref Range Status   MRSA by PCR NEGATIVE NEGATIVE Final    Comment:        The GeneXpert MRSA Assay (FDA approved for NASAL specimens only), is one component of a comprehensive MRSA colonization surveillance program. It is not intended to diagnose MRSA infection nor to guide or monitor treatment for MRSA infections.   Culture, blood  (Routine X 2) w Reflex to ID Panel     Status: None (Preliminary result)   Collection Time: 09/17/15  3:40 PM  Result Value Ref Range Status   Specimen Description BLOOD LEFT WRIST  Final   Special Requests BOTTLES DRAWN AEROBIC AND ANAEROBIC 5CC  Final   Culture NO GROWTH 3 DAYS  Final   Report Status PENDING  Incomplete  Culture, blood (Routine X 2) w Reflex to ID Panel     Status: None (Preliminary result)   Collection Time: 09/17/15  3:46 PM  Result Value Ref Range Status   Specimen Description BLOOD LEFT ANTECUBITAL  Final   Special Requests BOTTLES DRAWN AEROBIC AND ANAEROBIC 10CC  Final   Culture NO GROWTH 3 DAYS  Final   Report Status PENDING  Incomplete     Labs: Basic Metabolic Panel:  Recent Labs Lab 09/14/15 2102  09/16/15 1630 09/17/15 0354 09/18/15 0647 09/19/15 0600 09/20/15 0540  NA  --   < > 140 140 144 141 138  K  --   < > 3.8 3.1* 3.7 3.4* 4.0  CL  --   < > 103 91* 103 100* 100*  CO2  --   < > 26 36* 29 29 28   GLUCOSE  --   < > 198* 311* 257* 243* 232*  BUN  --   < > 59* 49* 61* 63* 64*  CREATININE  --   < > 3.19* 2.78* 3.01* 2.96* 3.05*  CALCIUM  --   < > 9.1 8.2* 9.2 8.9 8.8*  MG 2.8*  --   --   --  2.3  --   --   PHOS  --   --   --   --  3.2  --   --   < > = values in this interval not displayed. Liver Function Tests:  Recent Labs Lab 09/15/15 0511 09/16/15 0445 09/17/15 0354 09/18/15 0647 09/20/15 0540  AST 27 34 28 26 22   ALT 19 18 15* 14* 13*  ALKPHOS 66 58 47 48 49  BILITOT 1.4* 2.6* 2.9* 4.2* 2.4*  PROT 5.2* 5.7* 5.2* 5.7* 5.6*  ALBUMIN 3.2* 3.7 3.5 3.8 3.5   No results for input(s): LIPASE, AMYLASE in  the last 168 hours.  Recent Labs Lab 09/14/15 1605 09/15/15 0511 09/16/15 1630 09/17/15 0945  AMMONIA 271* 154* 43* 40*   CBC:  Recent Labs Lab 09/14/15 1604 09/15/15 0511  09/17/15 0300 09/17/15 0354 09/17/15 0800 09/17/15 1425 09/18/15 0647 09/19/15 0600 09/20/15 0540  WBC 2.1* 2.9*  < > 5.2 5.4  --  5.0 3.2* 2.7*  2.5*  NEUTROABS 1.6* 2.3  --  3.9  --   --   --   --   --   --   HGB 9.1* 9.1*  < > 6.6* 6.9* 7.9* 9.6* 9.3* 9.3* 9.4*  HCT 26.6* 26.4*  < > 19.7* 20.4* 23.6* 29.0* 26.9* 28.0* 29.3*  MCV 85.5 85.2  < > 85.7 86.1  --  86.1 86.8 88.1 89.3  PLT 20* 21*  < > 27* 31*  --  22* 25* 28* 28*  < > = values in this interval not displayed. Cardiac Enzymes: No results for input(s): CKTOTAL, CKMB, CKMBINDEX, TROPONINI in the last 168 hours. BNP: BNP (last 3 results)  Recent Labs  07/25/15 1144  BNP 75.7    ProBNP (last 3 results) No results for input(s): PROBNP in the last 8760 hours.  CBG:  Recent Labs Lab 09/19/15 1527 09/19/15 1649 09/19/15 2134 09/20/15 0839 09/20/15 1247  GLUCAP 263* 226* 220* 198* 267*       Signed:  Dia Crawford, MD [facility] [phone] pager

## 2015-09-20 NOTE — Progress Notes (Signed)
[facility] called with bed # Pt will be going to [address] Bed # [place] now for a transfer and [facility] has no truck. Called [facility] is sending an ambulance may not be here until 0100 am Will call with ETA

## 2015-09-21 ENCOUNTER — Telehealth: Payer: Self-pay | Admitting: Internal Medicine

## 2015-09-21 LAB — GLUCOSE, CAPILLARY
Glucose-Capillary: 142 mg/dL — ABNORMAL HIGH (ref 65–99)
Glucose-Capillary: 241 mg/dL — ABNORMAL HIGH (ref 65–99)

## 2015-09-21 NOTE — Telephone Encounter (Signed)
FYI [name]

## 2015-09-21 NOTE — Telephone Encounter (Signed)
Does not expect a call back just wanted to let office know.

## 2015-09-21 NOTE — Telephone Encounter (Signed)
Thanks

## 2015-09-22 LAB — CULTURE, BLOOD (ROUTINE X 2)
CULTURE: NO GROWTH
Culture: NO GROWTH

## 2015-09-24 ENCOUNTER — Ambulatory Visit (HOSPITAL_COMMUNITY): Payer: Managed Care, Other (non HMO)

## 2015-09-27 ENCOUNTER — Other Ambulatory Visit (HOSPITAL_COMMUNITY): Payer: Managed Care, Other (non HMO)

## 2015-10-01 ENCOUNTER — Ambulatory Visit (HOSPITAL_COMMUNITY): Payer: Managed Care, Other (non HMO)

## 2015-10-04 ENCOUNTER — Other Ambulatory Visit (HOSPITAL_COMMUNITY): Payer: Managed Care, Other (non HMO)

## 2015-11-05 HISTORY — PX: LIVER TRANSPLANT: SHX410

## 2015-11-06 ENCOUNTER — Ambulatory Visit: Payer: Managed Care, Other (non HMO) | Admitting: Physician Assistant

## 2015-11-12 ENCOUNTER — Ambulatory Visit: Payer: Managed Care, Other (non HMO) | Admitting: Infectious Disease

## 2015-11-16 ENCOUNTER — Other Ambulatory Visit: Payer: Self-pay | Admitting: Internal Medicine

## 2016-02-04 ENCOUNTER — Telehealth: Payer: Self-pay | Admitting: Neurology

## 2016-02-04 NOTE — Telephone Encounter (Signed)
Returned pt TC. F/u appt scheduled for Tues, Aug 2. Pt agreed to arrive 15 min prior to appt time.

## 2016-02-04 NOTE — Telephone Encounter (Signed)
Pt called in with complaints of burning in feet. 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  He would like an appt with Dr. Jannifer Franklin. I am not sure if he needs referral or not. Please call and advise [phone]

## 2016-02-06 ENCOUNTER — Ambulatory Visit (INDEPENDENT_AMBULATORY_CARE_PROVIDER_SITE_OTHER): Payer: Managed Care, Other (non HMO) | Admitting: Neurology

## 2016-02-06 ENCOUNTER — Encounter: Payer: Self-pay | Admitting: Neurology

## 2016-02-06 VITALS — BP 147/83 | HR 82 | Ht 70.0 in | Wt 183.0 lb

## 2016-02-06 DIAGNOSIS — E1340 Other specified diabetes mellitus with diabetic neuropathy, unspecified: Secondary | ICD-10-CM

## 2016-02-06 DIAGNOSIS — E538 Deficiency of other specified B group vitamins: Secondary | ICD-10-CM

## 2016-02-06 DIAGNOSIS — G0491 Myelitis, unspecified: Secondary | ICD-10-CM

## 2016-02-06 DIAGNOSIS — R202 Paresthesia of skin: Secondary | ICD-10-CM | POA: Diagnosis not present

## 2016-02-06 NOTE — Progress Notes (Signed)
Reason for visit: Paresthesias  [name] is an 65 y.o. 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 The patient had been seen in November 2016 with an epidural abscess around the cervical area that was resulting in venous congestion of the spinal cord. The patient may have suffered a small infarct of the cord with left-sided sensory alteration. 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 The patient now is on prednisone and Prograf. The patient indicates that since the surgery he has developed dysesthesias involving the feet that seems to be worse when he is up on his feet, better when he is resting. The patient denies any low back pain or pain down the legs. He does feel some tightness sensation in the buttock and upper thigh area on the left. He has some gait instability, he denies any falls. He denies issues controlling the bowels or the bladder. He has not noted any numbness in the hands. Initially after surgery he had severe swelling of the legs, but once the swelling dissipated, the tight feeling and discomfort in the feet never went away. He is sent to this office for an evaluation. He does have diabetes, and he is on prednisone currently.  Past Medical History:  Diagnosis Date  . Abscess in epidural space of cervical spine 08/13/2015  . Acute hepatic encephalopathy ([place])   . Acute renal failure superimposed on stage 3 chronic kidney disease ([place])   . Anemia of chronic disease   . Ascites   . Bleeding esophageal varices ([place])   . Coagulopathy ([place])   . Depression   . Diabetes mellitus type II, controlled ([place])   . Diabetic neuropathy ([place])   . Esophageal stricture   . NASH (nonalcoholic steatohepatitis)   . Protein-calorie malnutrition, severe ([place])   . Secondary biliary cirrhosis ([place])   . Thrombocytopenia ([place])     Past  Surgical History:  Procedure Laterality Date  . COLONOSCOPY    . ESOPHAGEAL BANDING Bilateral 11/29/2013   Procedure: ESOPHAGEAL BANDING;  Surgeon: Irene Shipper, MD;  Location: [facility];  Service: Endoscopy;  Laterality: Bilateral;  . ESOPHAGEAL BANDING N/A 06/13/2014   Procedure: ESOPHAGEAL BANDING;  Surgeon: Irene Shipper, MD;  Location: [facility];  Service: Endoscopy;  Laterality: N/A;  . ESOPHAGOGASTRODUODENOSCOPY  08/01/2011   Procedure: ESOPHAGOGASTRODUODENOSCOPY (EGD);  Surgeon: Scarlette Shorts, MD;  Location: [facility];  Service: Endoscopy;  Laterality: N/A;  . ESOPHAGOGASTRODUODENOSCOPY N/A 10/27/2012   Procedure: ESOPHAGOGASTRODUODENOSCOPY (EGD);  Surgeon: Inda Castle, MD;  Location: [facility];  Service: Endoscopy;  Laterality: N/A;  . ESOPHAGOGASTRODUODENOSCOPY N/A 11/22/2012   Procedure: ESOPHAGOGASTRODUODENOSCOPY (EGD);  Surgeon: Irene Shipper, MD;  Location: [facility];  Service: Endoscopy;  Laterality: N/A;  . ESOPHAGOGASTRODUODENOSCOPY N/A 11/29/2013   Procedure: ESOPHAGOGASTRODUODENOSCOPY (EGD);  Surgeon: Irene Shipper, MD;  Location: [facility];  Service: Endoscopy;  Laterality: N/A;  . ESOPHAGOGASTRODUODENOSCOPY N/A 06/13/2014   Procedure: ESOPHAGOGASTRODUODENOSCOPY (EGD);  Surgeon: Irene Shipper, MD;  Location: [facility];  Service: Endoscopy;  Laterality: N/A;  . KIDNEY STONE SURGERY  yrs ago  . 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 UPPER GASTROINTESTINAL ENDOSCOPY      Family History  Problem Relation Age of Onset  . Colon cancer Neg Hx   . Anesthesia problems Neg Hx   . Hypotension Neg Hx   . Malignant hyperthermia Neg Hx   .  Pseudochol deficiency Neg Hx   . Diabetes Father   . Heart disease Father   . Aneurysm Mother   . Heart disease Other     neice  . Diabetes      niece    Social history:  reports that he has never smoked. He has never used smokeless tobacco. He reports that he does not drink alcohol or use drugs.   No Known Allergies  Medications:    Prior to Admission medications   Medication Sig Start Date End Date Taking? Authorizing Provider  acidophilus (RISAQUAD) CAPS capsule Take 2 capsules by mouth daily. 09/10/15   Reyne Dumas, MD  Amino Acids-Protein Hydrolys (FEEDING SUPPLEMENT, PRO-STAT SUGAR FREE 64,) LIQD Take 30 mLs by mouth 3 (three) times daily. Patient taking differently: Take 30 mLs by mouth daily.  07/31/15   Belkys A Regalado, MD  amoxicillin (AMOXIL) 500 MG capsule Take 1 capsule (500 mg total) by mouth 3 (three) times daily. 08/13/15   Truman Hayward, MD  baclofen (LIORESAL) 20 MG tablet Take 1 tablet (20 mg total) by mouth at bedtime. 06/11/15   Meredith Staggers, MD  bethanechol (URECHOLINE) 50 MG tablet Take 1 tablet by mouth 2 (two) times daily.  12/19/14   Historical Provider, MD  FLUoxetine (PROZAC) 10 MG tablet Take 10 mg by mouth every morning.     Historical Provider, MD  furosemide (LASIX) 20 MG tablet TAKE 2 TABLETS (40 MG TOTAL) BY MOUTH DAILY. 11/16/15   Irene Shipper, MD  gabapentin (NEURONTIN) 100 MG capsule Take 100 mg by mouth 3 (three) times daily.     Historical Provider, MD  insulin aspart (NOVOLOG) 100 UNIT/ML injection Inject 0-15 Units into the skin every 4 (four) hours. 09/20/15   Allie Bossier, MD  insulin aspart (NOVOLOG) 100 UNIT/ML injection Inject 3 Units into the skin 3 (three) times daily with meals. 09/20/15   Allie Bossier, MD  insulin glargine (LANTUS) 100 UNIT/ML injection Inject 0.05 mLs (5 Units total) into the skin daily. 09/20/15   Allie Bossier, MD  lactulose (CHRONULAC) 10 GM/15ML solution Take 30 mLs (20 g total) by mouth 3 (three) times daily. 09/20/15   Allie Bossier, MD  midodrine (PROAMATINE) 10 MG tablet Take 1 tablet (10 mg total) by mouth 3 (three) times daily with meals. 09/20/15   Allie Bossier, MD  Multiple Vitamin (MULTIVITAMIN WITH MINERALS) TABS tablet Take 1 tablet by mouth daily.    Historical Provider, MD  octreotide (SANDOSTATIN) 100 MCG/ML SOLN injection Inject 1 mL  (100 mcg total) into the skin 3 (three) times daily. 09/20/15   Allie Bossier, MD  ondansetron [facility]) 4 MG/2ML SOLN injection Inject 2 mLs (4 mg total) into the vein every 6 (six) hours as needed for nausea. 09/20/15   Allie Bossier, MD  pantoprazole (PROTONIX) 40 MG tablet TAKE 1 TABLET (40 MG TOTAL) BY MOUTH DAILY. 11/13/14   Irene Shipper, MD  tamsulosin (FLOMAX) 0.4 MG CAPS capsule Take 1 capsule by mouth 2 (two) times daily.  12/19/14   Historical Provider, MD  Vitamin D, Ergocalciferol, (DRISDOL) 50000 UNITS CAPS capsule Take 50,000 Units by mouth every 7 (seven) days. Mondays. 03/29/15   Historical Provider, MD  XIFAXAN 550 MG TABS tablet TAKE 1 TABLET (550 MG TOTAL) BY MOUTH 2 (TWO) TIMES DAILY. 08/27/15   Irene Shipper, MD    ROS:  Out of a complete 14 system review of symptoms, the patient complains  only of the following symptoms, and all other reviewed systems are negative.  Ear pain Decreased activity Walking difficulty Bruising easily  Blood pressure (!) 147/83, pulse 82, height 5\' 10"  (1.778 m), weight 183 lb (83 kg).  Physical Exam  General: The patient is alert and cooperative at the time of the examination.  Respiratory: Lung fields are clear.  Cardiovascular: Regular rate rhythm no obvious murmurs or rubs are noted.  Eyes: Pupils are equal, round, and reactive to light. Discs are flat bilaterally.  Neck: Neck is supple, no carotid bruits are noted.  Skin: Slight edema at the ankles is noted bilaterally..   Neurologic Exam  Mental status: The patient is alert and oriented x 3 at the time of the examination. The patient has apparent normal recent and remote memory, with an apparently normal attention span and concentration ability.   Cranial nerves: Facial symmetry is present. Speech is normal, no aphasia or dysarthria is noted. Extraocular movements are full. Visual fields are full.  Motor: The patient has good strength in all 4 extremities.  Sensory  examination: Soft touch sensation is symmetric on the face, arms, and legs.  Coordination: The patient has good finger-nose-finger and heel-to-shin bilaterally.  Gait and station: The patient has a normal gait. Tandem gait is normal. Romberg is negative. No drift is seen.  Reflexes: Deep tendon reflexes are symmetric, with the exception that the left ankle jerk reflexes well-maintained, somewhat decreased on the right.   Assessment/Plan:  1. Paresthesias of feet  2. Diabetes  3. History of epidural abscess, cervical  4. Status post renal and liver transplant  The patient reports his symptoms with the feet since a liver and renal transplant in May 2017. The patient does not have a clear stocking pattern pinprick sensory deficit. The patient reports pain when he is up on his feet, better with rest. This would be atypical for a peripheral neuropathy. The patient will be set up for nerve conduction studies of both legs, one arm. He will have EMG of the left leg. The gabapentin dose could be increased, he currently is on 100 mg twice daily, GFR by blood work is around 44. He could potentially go to 300 mg twice daily. He will check with his transplant physicians [facility]. Blood work will be done today.  Jill Alexanders MD 02/06/2016 3:05 PM  [facility] [address] [place], [place] [address]  Phone [phone] Fax [phone]

## 2016-02-08 LAB — MULTIPLE MYELOMA PANEL, SERUM
ALBUMIN SERPL ELPH-MCNC: 4.1 g/dL (ref 2.9–4.4)
ALPHA2 GLOB SERPL ELPH-MCNC: 0.6 g/dL (ref 0.4–1.0)
Albumin/Glob SerPl: 1.6 (ref 0.7–1.7)
Alpha 1: 0.3 g/dL (ref 0.0–0.4)
B-Globulin SerPl Elph-Mcnc: 0.9 g/dL (ref 0.7–1.3)
Gamma Glob SerPl Elph-Mcnc: 0.8 g/dL (ref 0.4–1.8)
Globulin, Total: 2.6 g/dL (ref 2.2–3.9)
IGG (IMMUNOGLOBIN G), SERUM: 800 mg/dL (ref 700–1600)
IGM (IMMUNOGLOBULIN M), SRM: 39 mg/dL (ref 20–172)
IgA/Immunoglobulin A, Serum: 211 mg/dL (ref 61–437)
TOTAL PROTEIN: 6.7 g/dL (ref 6.0–8.5)

## 2016-02-08 LAB — RHEUMATOID FACTOR: Rhuematoid fact SerPl-aCnc: <10 IU/mL (ref 0.0–13.9)

## 2016-02-08 LAB — ANA W/REFLEX: ANA: NEGATIVE

## 2016-02-08 LAB — B. BURGDORFI ANTIBODIES: Lyme IgG/IgM Ab: <0.91 {ISR} (ref 0.00–0.90)

## 2016-02-08 LAB — ANGIOTENSIN CONVERTING ENZYME: ANGIO CONVERT ENZYME: 49 U/L (ref 14–82)

## 2016-02-08 LAB — VITAMIN B12: VITAMIN B 12: 611 pg/mL (ref 211–946)

## 2016-02-08 LAB — COPPER, SERUM: Copper: 100 ug/dL (ref 72–166)

## 2016-02-26 ENCOUNTER — Telehealth: Payer: Self-pay | Admitting: Neurology

## 2016-02-26 ENCOUNTER — Ambulatory Visit (INDEPENDENT_AMBULATORY_CARE_PROVIDER_SITE_OTHER): Payer: Self-pay | Admitting: Neurology

## 2016-02-26 ENCOUNTER — Ambulatory Visit (INDEPENDENT_AMBULATORY_CARE_PROVIDER_SITE_OTHER): Payer: Managed Care, Other (non HMO) | Admitting: Neurology

## 2016-02-26 ENCOUNTER — Encounter: Payer: Self-pay | Admitting: Neurology

## 2016-02-26 DIAGNOSIS — G609 Hereditary and idiopathic neuropathy, unspecified: Secondary | ICD-10-CM

## 2016-02-26 DIAGNOSIS — D61818 Other pancytopenia: Secondary | ICD-10-CM

## 2016-02-26 DIAGNOSIS — R202 Paresthesia of skin: Secondary | ICD-10-CM

## 2016-02-26 MED ORDER — PREGABALIN 50 MG PO CAPS: 50.0000 mg | ORAL_CAPSULE | Freq: Two times a day (BID) | ORAL | 2 refills | Status: DC

## 2016-02-26 NOTE — Progress Notes (Signed)
Please refer to EMG and nerve conduction study procedure note. 

## 2016-02-26 NOTE — Procedures (Signed)
     HISTORY:  [name] is a 65 year old gentleman with a history of non-alcoholic cirrhosis of the liver, status post liver transplant. Following surgery he has developed some numbness in the feet, he has a prior history of an epidural abscess affecting the cervical area, he has had some left arm and leg residual symptoms as well. The patient is being evaluated for a possible peripheral neuropathy.  NERVE CONDUCTION STUDIES:  Nerve conduction studies were performed on the left upper extremity. The distal motor latency for the left median nerve was prolonged with a low motor amplitude seen. The distal motor latency and motor amplitude for the left ulnar nerve was normal. The F wave latencies and nerve conduction velocities for the left median and ulnar nerves were normal. The sensory latency for the left median nerve was prolonged, normal for the left ulnar nerve.  Nerve conduction studies were performed on both lower extremities. The distal motor latencies for the peroneal nerves were within normal limits, but the study on the left side was borderline normal. The motor amplitudes for these nerves were low. The distal motor latencies for the posterior tibial nerves were prolonged with low motor amplitudes seen. The nerve conduction velocities for the peroneal nerves were normal on the right, slightly slow on the left, and normal for the posterior tibial nerves bilaterally. The sensory latencies for the peroneal nerves were absent bilaterally. The H reflex latencies were absent bilaterally.  EMG STUDIES:  EMG study was performed on the left lower extremity:  The tibialis anterior muscle reveals 2 to 4K motor units with full recruitment. No fibrillations or positive waves were seen. The peroneus tertius muscle reveals 2 to 4K motor units with full recruitment. No fibrillations or positive waves were seen. The medial gastrocnemius muscle reveals 1 to 3K motor units with full recruitment. No  fibrillations or positive waves were seen. The vastus lateralis muscle reveals 2 to 4K motor units with full recruitment. No fibrillations or positive waves were seen. The iliopsoas muscle reveals 2 to 4K motor units with full recruitment. No fibrillations or positive waves were seen. The biceps femoris muscle (long head) reveals 2 to 4K motor units with full recruitment. No fibrillations or positive waves were seen. The lumbosacral paraspinal muscles were tested at 3 levels, and revealed no abnormalities of insertional activity at all 3 levels tested. There was good relaxation.   IMPRESSION:  Nerve conduction studies done on the left upper extremity and both lower extremities shows evidence of a primarily axonal peripheral neuropathy of moderate severity. 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 EMG evaluation of the left lower extremity however was unremarkable, suggesting that the severity of the peripheral neuropathy is less then suggested by the nerve conduction study. There is no evidence of an overlying left lumbosacral radiculopathy.  Jill Alexanders MD 02/26/2016 12:58 PM  [facility] [address] [place], [place] [address]  Phone [phone] Fax [phone]

## 2016-02-26 NOTE — Progress Notes (Signed)
[name] comes in today for EMG and nerve conduction study evaluation. This study shows evidence of a peripheral neuropathy, but the EMG evaluation of the left leg suggest that the peripheral neuropathy is not as severe as the nerve conduction study suggests. The patient is on gabapentin taking 300 mg twice daily without much benefit.  We will switch patient to Lyrica taking 50 mg twice daily [place] the patient was given a prescription today. He will follow-up in 4 months for an evaluation.

## 2016-05-12 ENCOUNTER — Other Ambulatory Visit: Payer: Self-pay

## 2016-05-12 MED ORDER — PREGABALIN 50 MG PO CAPS: 50.0000 mg | ORAL_CAPSULE | Freq: Two times a day (BID) | ORAL | 1 refills | Status: DC

## 2016-05-12 NOTE — Telephone Encounter (Signed)
Rx signed and faxed to pharmacy

## 2016-05-12 NOTE — Telephone Encounter (Signed)
Rx printed for 90 day refills as requested per fax from mail order pharmacy.

## 2016-06-11 ENCOUNTER — Encounter: Payer: Self-pay | Admitting: Neurology

## 2016-06-11 ENCOUNTER — Ambulatory Visit (INDEPENDENT_AMBULATORY_CARE_PROVIDER_SITE_OTHER): Payer: Managed Care, Other (non HMO) | Admitting: Neurology

## 2016-06-11 VITALS — BP 147/79 | HR 67 | Ht 70.0 in | Wt 218.0 lb

## 2016-06-11 DIAGNOSIS — G0491 Myelitis, unspecified: Secondary | ICD-10-CM | POA: Diagnosis not present

## 2016-06-11 DIAGNOSIS — E1142 Type 2 diabetes mellitus with diabetic polyneuropathy: Secondary | ICD-10-CM

## 2016-06-11 MED ORDER — PREGABALIN 50 MG PO CAPS: ORAL_CAPSULE | ORAL | 3 refills | Status: DC

## 2016-06-11 NOTE — Progress Notes (Signed)
Reason for visit: Peripheral neuropathy  [name] is an 65 y.o. male  History of present illness:  [name] is a 65 year old right-handed white male with a history of nonalcoholic cirrhosis of the liver that required a transplant. The patient has chronic renal insufficiency as well. He is followed through [facility] for [facility]. 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 Some of these symptoms have persisted, but have improved over time. The patient has dysesthesias in the feet, if he is particularly active during the day he will have increased discomfort in the legs at night, left greater than right at night. He has not sustained any falls, occasionally he will feel as if the left leg will collapse. He has not gained benefit with gabapentin, switching to Lyrica offered better improvement in symptoms but he believes that he needs more medication. He returns for an evaluation.  Past Medical History:  Diagnosis Date  . Abscess in epidural space of cervical spine 08/13/2015  . Acute hepatic encephalopathy   . Acute renal failure superimposed on stage 3 chronic kidney disease ([place])   . Anemia of chronic disease   . Ascites   . Bleeding esophageal varices ([place])   . Coagulopathy ([place])   . Depression   . Diabetes mellitus type II, controlled ([place])   . Diabetic neuropathy ([place])   . Esophageal stricture   . NASH (nonalcoholic steatohepatitis)   . Protein-calorie malnutrition, severe ([place])   . Secondary biliary cirrhosis ([place])   . Thrombocytopenia ([place])     Past Surgical History:  Procedure Laterality Date  . COLONOSCOPY    . ESOPHAGEAL BANDING Bilateral 11/29/2013   Procedure: ESOPHAGEAL BANDING;  Surgeon: Irene Shipper, MD;  Location: [facility];  Service: Endoscopy;  Laterality: Bilateral;  . ESOPHAGEAL BANDING N/A 06/13/2014   Procedure: ESOPHAGEAL  BANDING;  Surgeon: Irene Shipper, MD;  Location: [facility];  Service: Endoscopy;  Laterality: N/A;  . ESOPHAGOGASTRODUODENOSCOPY  08/01/2011   Procedure: ESOPHAGOGASTRODUODENOSCOPY (EGD);  Surgeon: Scarlette Shorts, MD;  Location: [facility];  Service: Endoscopy;  Laterality: N/A;  . ESOPHAGOGASTRODUODENOSCOPY N/A 10/27/2012   Procedure: ESOPHAGOGASTRODUODENOSCOPY (EGD);  Surgeon: Inda Castle, MD;  Location: [facility];  Service: Endoscopy;  Laterality: N/A;  . ESOPHAGOGASTRODUODENOSCOPY N/A 11/22/2012   Procedure: ESOPHAGOGASTRODUODENOSCOPY (EGD);  Surgeon: Irene Shipper, MD;  Location: [facility];  Service: Endoscopy;  Laterality: N/A;  . ESOPHAGOGASTRODUODENOSCOPY N/A 11/29/2013   Procedure: ESOPHAGOGASTRODUODENOSCOPY (EGD);  Surgeon: Irene Shipper, MD;  Location: [facility];  Service: Endoscopy;  Laterality: N/A;  . ESOPHAGOGASTRODUODENOSCOPY N/A 06/13/2014   Procedure: ESOPHAGOGASTRODUODENOSCOPY (EGD);  Surgeon: Irene Shipper, MD;  Location: [facility];  Service: Endoscopy;  Laterality: N/A;  . KIDNEY STONE SURGERY  yrs ago  . 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 UPPER GASTROINTESTINAL ENDOSCOPY      Family History  Problem Relation Age of Onset  . Diabetes Father   . Heart disease Father   . Aneurysm Mother   . Heart disease Other     neice  . Diabetes      niece  . Colon cancer Neg Hx   . Anesthesia problems Neg Hx   . Hypotension Neg Hx   . Malignant hyperthermia Neg Hx   . Pseudochol deficiency Neg Hx     Social history:  reports that he has never smoked. He has never used smokeless tobacco. He  reports that he does not drink alcohol or use drugs.   No Known Allergies  Medications:  Prior to Admission medications   Medication Sig Start Date End Date Taking? Authorizing Provider  acetaminophen (TYLENOL) 325 MG tablet Take by mouth. 11/20/15  Yes Historical Provider, MD  amLODipine (NORVASC) 5 MG tablet Take by mouth. 12/31/15 12/30/16 Yes Historical Provider, MD  aspirin EC 81  MG tablet Take by mouth. 11/15/15  Yes Historical Provider, MD  carvedilol (COREG) 3.125 MG tablet Take 1 tablet by mouth 2 (two) times daily with a meal.  04/14/16  Yes Historical Provider, MD  HUMALOG KWIKPEN 100 UNIT/ML KiwkPen 26 units in am and at noon, 18 units at bedtime 04/11/16  Yes Historical Provider, MD  insulin glargine (LANTUS) 100 UNIT/ML injection Inject 0.05 mLs (5 Units total) into the skin daily. Patient taking differently: Inject 14 Units into the skin at bedtime.  09/20/15  Yes Allie Bossier, MD  magnesium oxide (MAG-OX) 400 MG tablet Take by mouth. 12/10/15 12/09/16 Yes Historical Provider, MD  Melatonin 3 MG TABS Take by mouth. 11/15/15  Yes Historical Provider, MD  predniSONE (DELTASONE) 5 MG tablet Take 5 mg by mouth daily.  01/01/16  Yes Historical Provider, MD  pregabalin (LYRICA) 50 MG capsule Take 1 capsule (50 mg total) by mouth 2 (two) times daily. 05/12/16  Yes Kathrynn Ducking, MD  senna-docusate (SENOKOT-S) 8.6-50 MG tablet Take by mouth. 11/15/15  Yes Historical Provider, MD  sulfamethoxazole-trimethoprim (BACTRIM DS,SEPTRA DS) 800-160 MG tablet  01/30/16  Yes Historical Provider, MD  tacrolimus (PROGRAF) 1 MG capsule Take 3 mg in the morning and 2 mg in the evening. 01/31/16  Yes Historical Provider, MD  tamsulosin (FLOMAX) 0.4 MG CAPS capsule Take by mouth. 11/15/15  Yes Historical Provider, MD    ROS:  Out of a complete 14 system review of symptoms, the patient complains only of the following symptoms, and all other reviewed systems are negative.  Heart murmur Swollen abdomen, constipation Walking difficulty Numbness  Blood pressure (!) 147/79, pulse 67, height 5\' 10"  (1.778 m), weight 218 lb (98.9 kg).  Physical Exam  General: The patient is alert and cooperative at the time of the examination.  Skin: No significant peripheral edema is noted.   Neurologic Exam  Mental status: The patient is alert and oriented x 3 at the time of the examination. The patient  has apparent normal recent and remote memory, with an apparently normal attention span and concentration ability.   Cranial nerves: Facial symmetry is present. Speech is normal, no aphasia or dysarthria is noted. Extraocular movements are full. Visual fields are full.  Motor: The patient has good strength in all 4 extremities.  Sensory examination: Soft touch sensation is symmetric on the face, arms, and legs.  Coordination: The patient has good finger-nose-finger and heel-to-shin bilaterally.  Gait and station: The patient has a slightly wide-based gait. Tandem gait is unsteady. Romberg is negative. No drift is seen.  Reflexes: Deep tendon reflexes are symmetric, with exception that the left ankle jerk reflexes elevated relative to the right.   Assessment/Plan:  1. Nonalcoholic cirrhosis of the liver, status post transplant  2. Cervical spinal cord venous infarct, left-sided symptoms  3. Peripheral neuropathy  The patient has done somewhat better on the Lyrica, we will increase the dose taking 50 mg in the morning and 100 mg in the evening. The patient will follow-up in 6 months, sooner if needed. He will contact us if dose adjustments are  required.  Jill Alexanders MD 06/11/2016 8:31 AM  [facility] [address] [place], [place] [address]  Phone [phone] Fax [phone]

## 2016-06-11 NOTE — Patient Instructions (Signed)
    We will increase the Lyrica 50 mg capsule to one in the morning and 2 in the evening.

## 2016-06-20 ENCOUNTER — Ambulatory Visit (INDEPENDENT_AMBULATORY_CARE_PROVIDER_SITE_OTHER): Payer: Managed Care, Other (non HMO) | Admitting: Internal Medicine

## 2016-06-20 ENCOUNTER — Encounter: Payer: Self-pay | Admitting: Internal Medicine

## 2016-06-20 VITALS — BP 138/68 | HR 68 | Ht 70.0 in | Wt 225.2 lb

## 2016-06-20 DIAGNOSIS — K5900 Constipation, unspecified: Secondary | ICD-10-CM

## 2016-06-20 DIAGNOSIS — Z944 Liver transplant status: Secondary | ICD-10-CM | POA: Diagnosis not present

## 2016-06-20 DIAGNOSIS — Z940 Kidney transplant status: Secondary | ICD-10-CM

## 2016-06-20 DIAGNOSIS — K7469 Other cirrhosis of liver: Secondary | ICD-10-CM | POA: Diagnosis not present

## 2016-06-20 NOTE — Patient Instructions (Signed)
Use Miralax as discussed.

## 2016-06-20 NOTE — Progress Notes (Signed)
HISTORY OF PRESENT ILLNESS:  [name] is a 65 y.o. male with multiple medical problems who has been followed in this office for hepatic cirrhosis secondary to [name], adenomatous colon polyps, and GERD. Last evaluated February 2017. Problems with his liver disease including variceal bleeding, encephalopathy, and refractory ascites. As well renal insufficiency. Had been evaluated at [facility] for liver transplantation but initially felt not to be a candidate secondary to problems with cervical epidural abscess with transient tetraplegia in April 2016. Patient was hospitalized in [place] in March with encephalopathy. Subsequently transferred to [facility]. 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 They tell me he had a complication of stroke with left-sided weakness which has improved. Has been followed closely at [facility] since. He presents today with his wife with chief complaint of constipation. Patient's last colonoscopy September 2013 with tubulovillous adenoma. Problems with constipation over the past 5 weeks. Currently taking Senokot. Has 1 bowel movement per day but finds this unsatisfactory. Tried MiraLAX and limited basis but not recently. No bleeding or other complaints.  REVIEW OF SYSTEMS:  All non-GI ROS negative except for sinus and allergy trouble, left leg weakness  Past Medical History:  Diagnosis Date  . Abscess in epidural space of cervical spine 08/13/2015  . Acute hepatic encephalopathy   . Acute renal failure superimposed on stage 3 chronic kidney disease ([place])   . Anemia of chronic disease   . Ascites   . Bleeding esophageal varices ([place])   . Coagulopathy ([place])   . Depression   . Diabetes mellitus type II, controlled ([place])   . Diabetic neuropathy ([place])   . Esophageal stricture   . NASH (nonalcoholic steatohepatitis)   . Protein-calorie malnutrition, severe ([place])   . Secondary biliary cirrhosis ([place])   . Thrombocytopenia ([place])      Past Surgical History:  Procedure Laterality Date  . COLONOSCOPY    . ESOPHAGEAL BANDING Bilateral 11/29/2013   Procedure: ESOPHAGEAL BANDING;  Surgeon: Jonathan Shipper, MD;  Location: [facility] ENDOSCOPY;  Service: Endoscopy;  Laterality: Bilateral;  . ESOPHAGEAL BANDING N/A 06/13/2014   Procedure: ESOPHAGEAL BANDING;  Surgeon: Jonathan Shipper, MD;  Location: [facility];  Service: Endoscopy;  Laterality: N/A;  . ESOPHAGOGASTRODUODENOSCOPY  08/01/2011   Procedure: ESOPHAGOGASTRODUODENOSCOPY (EGD);  Surgeon: Jonathan Shorts, MD;  Location: [facility];  Service: Endoscopy;  Laterality: N/A;  . ESOPHAGOGASTRODUODENOSCOPY N/A 10/27/2012   Procedure: ESOPHAGOGASTRODUODENOSCOPY (EGD);  Surgeon: Jonathan Castle, MD;  Location: [facility];  Service: Endoscopy;  Laterality: N/A;  . ESOPHAGOGASTRODUODENOSCOPY N/A 11/22/2012   Procedure: ESOPHAGOGASTRODUODENOSCOPY (EGD);  Surgeon: Jonathan Shipper, MD;  Location: [facility];  Service: Endoscopy;  Laterality: N/A;  . ESOPHAGOGASTRODUODENOSCOPY N/A 11/29/2013   Procedure: ESOPHAGOGASTRODUODENOSCOPY (EGD);  Surgeon: Jonathan Shipper, MD;  Location: [facility];  Service: Endoscopy;  Laterality: N/A;  . ESOPHAGOGASTRODUODENOSCOPY N/A 06/13/2014   Procedure: ESOPHAGOGASTRODUODENOSCOPY (EGD);  Surgeon: Jonathan Shipper, MD;  Location: [facility];  Service: Endoscopy;  Laterality: N/A;  . KIDNEY STONE SURGERY  yrs ago  . 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 UPPER GASTROINTESTINAL ENDOSCOPY      Social History [name]  reports that he has never smoked. He has never used smokeless tobacco. He reports that he does not drink alcohol or use drugs.  family history includes Aneurysm in his mother; Diabetes in his father; Heart disease in his father and other.  No Known Allergies     PHYSICAL EXAMINATION: Vital signs: BP 138/68   Pulse 68  Ht 5\' 10"  (1.778 m)   Wt 225 lb 4 oz (102.2 kg)   BMI 32.32 kg/m   Constitutional:Pleasant, obese, generally well-appearing, no acute  distress Psychiatric: alert and oriented x3, cooperative Eyes: extraocular movements intact, anicteric, conjunctiva pink Mouth: oral pharynx moist, no lesions Neck: supple no lymphadenopathy Cardiovascular: heart regular rate and rhythm, no murmur Lungs: clear to auscultation bilaterally Abdomen: soft, nontender, nondistended, no obvious ascites, no peritoneal signs, normal bowel sounds, no organomegaly. Surgical incisions well-healed Rectal: Omitted Extremities: no clubbing cyanosis or lower extremity edema bilaterally Skin: no lesions on visible extremities Neuro: No focal deficits. No asterixis.  ASSESSMENT:  #1. History of NASH cirrhosis/end-stage liver disease with multiple complications/renal failure status post liver kidney transplantation at [facility] May 2017 #2. Functional constipation #3. History of adenomatous colon polyps #4. GERD #5. Obesity  PLAN:  #1. Continue close follow-up at [facility] regarding her liver #2. Recommend MiraLAX daily. Titrate to desired effect #3. Set recall colonoscopy for 6 months assuming he continues to do well posttransplantation #4. Interval follow-up as needed

## 2016-12-10 ENCOUNTER — Encounter: Payer: Self-pay | Admitting: Adult Health

## 2016-12-10 ENCOUNTER — Ambulatory Visit (INDEPENDENT_AMBULATORY_CARE_PROVIDER_SITE_OTHER): Payer: Managed Care, Other (non HMO) | Admitting: Adult Health

## 2016-12-10 VITALS — BP 145/88 | HR 72 | Ht 70.0 in | Wt 246.2 lb

## 2016-12-10 DIAGNOSIS — G0491 Myelitis, unspecified: Secondary | ICD-10-CM | POA: Diagnosis not present

## 2016-12-10 DIAGNOSIS — E1142 Type 2 diabetes mellitus with diabetic polyneuropathy: Secondary | ICD-10-CM | POA: Diagnosis not present

## 2016-12-10 NOTE — Patient Instructions (Signed)
Continue Lyrica 50 mg in the morning, 100 mg at bedtime Try compounded cream  If your symptoms worsen or you develop new symptoms please let us know.

## 2016-12-10 NOTE — Progress Notes (Signed)
I have read the note, and I agree with the clinical assessment and plan.  Mauria Asquith KEITH   

## 2016-12-10 NOTE — Progress Notes (Signed)
PATIENT: [name] DOB: [birth year]  REASON FOR VISIT: follow up- myelitis, peripheral neuropathy HISTORY FROM: patient  HISTORY OF PRESENT ILLNESS: [name] is a 66 year old male with a history of cervical myelitis and diabetic peripheral neuropathy. He returns today for follow-up. He states that Lyrica has offered him some benefit. He states if he lays down in the afternoons on the couch occasionally he will have burning and tingling in the feet and they will feel cold. He states wrapping them in a  quilt is beneficial. He denies any significant changes with his gait. Reports he continues to have a limp on the left side. Denies any falls. Denies any trouble sleeping. 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 Reports that he feels that the left arm and leg does not work as well as the right. He denies any new neurological symptoms. He returns today for an evaluation.     HISTORY 06/11/16: [name] is a 66 year old right-handed white male with a history of nonalcoholic cirrhosis of the liver that required a transplant. The patient has chronic renal insufficiency as well. He is [facility] for this. 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 Some of these symptoms have persisted, but have improved over time. The patient has dysesthesias in the feet, if he is particularly active during the day he will have increased discomfort in the legs at night, left greater than right at night. He has not sustained any falls, occasionally he will feel as if the left leg will collapse. He has not gained benefit with gabapentin, switching to Lyrica offered better improvement in symptoms but he believes that he needs more medication. He returns for an evaluation.   REVIEW OF SYSTEMS: Out of a  complete 14 system review of symptoms, the patient complains only of the following symptoms, and all other reviewed systems are negative.  Ear pain, snoring, muscle cramps, walking difficulty, numbness  ALLERGIES: No Known Allergies  HOME MEDICATIONS: Outpatient Medications Prior to Visit  Medication Sig Dispense Refill  . acetaminophen (TYLENOL) 325 MG tablet Take by mouth.    Marland Kitchen amLODipine (NORVASC) 5 MG tablet Take by mouth.    Marland Kitchen aspirin EC 81 MG tablet Take by mouth.    . carvedilol (COREG) 3.125 MG tablet Take 1 tablet by mouth 2 (two) times daily with a meal.     . HUMALOG KWIKPEN 100 UNIT/ML KiwkPen 26 units in am and at noon, 18 units at bedtime    . insulin glargine (LANTUS) 100 UNIT/ML injection Inject 0.05 mLs (5 Units total) into the skin daily. (Patient taking differently: Inject 14 Units into the skin at bedtime. ) 10 mL 11  . Melatonin 3 MG TABS Take by mouth.    . predniSONE (DELTASONE) 5 MG tablet Take 5 mg by mouth daily.     . pregabalin (LYRICA) 50 MG capsule One capsule in the morning and 2 in the evening 270 capsule 3  . senna-docusate (SENOKOT-S) 8.6-50 MG tablet Take by mouth.    . tacrolimus (PROGRAF) 1 MG capsule Take 3 mg in the morning and 2 mg in the evening.    . tamsulosin (FLOMAX) 0.4 MG CAPS capsule Take by mouth.    . sulfamethoxazole-trimethoprim (BACTRIM DS,SEPTRA DS) 800-160 MG tablet      No  facility-administered medications prior to visit.     PAST MEDICAL HISTORY: Past Medical History:  Diagnosis Date  . Abscess in epidural space of cervical spine 08/13/2015  . Acute hepatic encephalopathy   . Acute renal failure superimposed on stage 3 chronic kidney disease ([place])   . Anemia of chronic disease   . Ascites   . Bleeding esophageal varices ([place])   . Coagulopathy ([place])   . Depression   . Diabetes mellitus type II, controlled ([place])   . Diabetic neuropathy ([place])   . Esophageal stricture   . NASH (nonalcoholic steatohepatitis)   .  Protein-calorie malnutrition, severe ([place])   . Secondary biliary cirrhosis ([place])   . Thrombocytopenia ([place])     PAST SURGICAL HISTORY: Past Surgical History:  Procedure Laterality Date  . COLONOSCOPY    . ESOPHAGEAL BANDING Bilateral 11/29/2013   Procedure: ESOPHAGEAL BANDING;  Surgeon: Irene Shipper, MD;  [facility];  Service: Endoscopy;  Laterality: Bilateral;  . ESOPHAGEAL BANDING N/A 06/13/2014   Procedure: ESOPHAGEAL BANDING;  Surgeon: Irene Shipper, MD;  [facility];  Service: Endoscopy;  Laterality: N/A;  . ESOPHAGOGASTRODUODENOSCOPY  08/01/2011   Procedure: ESOPHAGOGASTRODUODENOSCOPY (EGD);  Surgeon: Scarlette Shorts, MD;  Location: [facility];  Service: Endoscopy;  Laterality: N/A;  . ESOPHAGOGASTRODUODENOSCOPY N/A 10/27/2012   Procedure: ESOPHAGOGASTRODUODENOSCOPY (EGD);  Surgeon: Inda Castle, MD;  Location: [facility];  Service: Endoscopy;  Laterality: N/A;  . ESOPHAGOGASTRODUODENOSCOPY N/A 11/22/2012   Procedure: ESOPHAGOGASTRODUODENOSCOPY (EGD);  Surgeon: Irene Shipper, MD;  Location: [facility];  Service: Endoscopy;  Laterality: N/A;  . ESOPHAGOGASTRODUODENOSCOPY N/A 11/29/2013   Procedure: ESOPHAGOGASTRODUODENOSCOPY (EGD);  Surgeon: Irene Shipper, MD;  Location: [facility];  Service: Endoscopy;  Laterality: N/A;  . ESOPHAGOGASTRODUODENOSCOPY N/A 06/13/2014   Procedure: ESOPHAGOGASTRODUODENOSCOPY (EGD);  Surgeon: Irene Shipper, MD;  Location: [facility];  Service: Endoscopy;  Laterality: N/A;  . KIDNEY STONE SURGERY  yrs ago  . 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 UPPER GASTROINTESTINAL ENDOSCOPY      FAMILY HISTORY: Family History  Problem Relation Age of Onset  . Diabetes Father   . Heart disease Father   . Aneurysm Mother   . Heart disease Other        neice  . Diabetes Unknown        niece  . Colon cancer Neg Hx   . Anesthesia problems Neg Hx   . Hypotension Neg Hx   . Malignant hyperthermia Neg Hx   . Pseudochol deficiency Neg Hx     SOCIAL  HISTORY: Social History   Social History  . Marital status: Married    Spouse name: N/A  . Number of children: 1  . Years of education: 9   Occupational History  . Retired    Social History Main Topics  . Smoking status: Never Smoker  . Smokeless tobacco: Never Used  . Alcohol use No     Comment: very rare  . Drug use: No  . Sexual activity: No   Other Topics Concern  . Not on file   Social History Narrative   Lives at home w/ his wife and 2 poodles   Patient drinks caffeine 1-2 times per week.   Patient is right handed.       PHYSICAL EXAM  Vitals:   12/10/16 0854  BP: (!) 145/88  Pulse: 72  Weight: 246 lb 3.2 oz (111.7 kg)  Height: 5\' 10"  (1.778 m)   Body mass index is 35.33 kg/m.  Generalized:  Well developed, in no acute distress   Neurological examination  Mentation: Alert oriented to time, place, history taking. Follows all commands speech and language fluent Cranial nerve II-XII: Pupils were equal round reactive to light. Extraocular movements were full, visual field were full on confrontational test. Facial sensation and strength were normal. Uvula tongue midline. Head turning and shoulder shrug  were normal and symmetric. Motor: The motor testing reveals 5 over 5 strength of all 4 extremities. Good symmetric motor tone is noted throughout.  Sensory: Sensory testing is intact to soft touch on all 4 extremities. No evidence of extinction is noted.  Coordination: Cerebellar testing reveals good finger-nose-finger and heel-to-shin bilaterally.  Gait and station: Gait is normal. Tandem gait is normal. Romberg is negative. No drift is seen.  Reflexes: Deep tendon reflexes are symmetric and normal bilaterally.   DIAGNOSTIC DATA (LABS, IMAGING, TESTING) - I reviewed patient records, labs, notes, testing and imaging myself where available.  Lab Results  Component Value Date   WBC 2.5 (L) 09/20/2015   HGB 9.4 (L) 09/20/2015   HCT 29.3 (L) 09/20/2015   MCV  89.3 09/20/2015   PLT 28 (LL) 09/20/2015      Component Value Date/Time   NA 138 09/20/2015 0540   K 4.0 09/20/2015 0540   CL 100 (L) 09/20/2015 0540   CO2 28 09/20/2015 0540   GLUCOSE 232 (H) 09/20/2015 0540   BUN 64 (H) 09/20/2015 0540   CREATININE 3.05 (H) 09/20/2015 0540   CREATININE 2.21 (H) 08/13/2015 1530   CALCIUM 8.8 (L) 09/20/2015 0540   PROT 6.7 02/06/2016 1550   ALBUMIN 3.5 09/20/2015 0540   AST 22 09/20/2015 0540   ALT 13 (L) 09/20/2015 0540   ALKPHOS 49 09/20/2015 0540   BILITOT 2.4 (H) 09/20/2015 0540   GFRNONAA 20 (L) 09/20/2015 0540   GFRNONAA 31 (L) 08/13/2015 1530   GFRAA 24 (L) 09/20/2015 0540   GFRAA 36 (L) 08/13/2015 1530    Lab Results  Component Value Date   HGBA1C 6.6 (H) 09/06/2015   Lab Results  Component Value Date   VITAMINB12 611 02/06/2016   Lab Results  Component Value Date   TSH 1.748 07/25/2015      ASSESSMENT AND PLAN 66 y.o. year old male  has a past medical history of Abscess in epidural space of cervical spine (08/13/2015); Acute hepatic encephalopathy; Acute renal failure superimposed on stage 3 chronic kidney disease ([place]); Anemia of chronic disease; Ascites; Bleeding esophageal varices ([place]); Coagulopathy ([place]); Depression; Diabetes mellitus type II, controlled ([place]); Diabetic neuropathy ([place]); Esophageal stricture; NASH (nonalcoholic steatohepatitis); Protein-calorie malnutrition, severe ([place]); Secondary biliary cirrhosis ([place]); and Thrombocytopenia ([place]). here with:   1. Cervical spinal cord venous infarct  2. Peripheral neuropathy  The patient will continue on Lyrica 50 mg morning and 100 mg in the evening. The patient is interested in using a compounded cream to see if it is beneficial. I will send an order to [facility]. Patient is advised that if his symptoms worsen or he develops new symptoms he should let us know. He will follow-up in 6 months or sooner if needed.    Ward Givens, MSN, NP-C 12/10/2016,  9:09 AM [facility] [address], [place], [place] [address] [phone]

## 2016-12-10 NOTE — Progress Notes (Signed)
Fax confirmation received for [facility] compound cream [phone]. sy

## 2017-01-15 ENCOUNTER — Other Ambulatory Visit: Payer: Self-pay | Admitting: *Deleted

## 2017-01-15 MED ORDER — PREGABALIN 50 MG PO CAPS: ORAL_CAPSULE | ORAL | 1 refills | Status: DC

## 2017-01-15 NOTE — Progress Notes (Signed)
Faxed printed/signed rx Lyrica to pt pharmacy. Fax: [phone]. Received confirmation  Originally rx printed in MM,NP name, but she cannot sign for 3 months supply. I reprinted in CW,MD name. Destroyed original rx printed in MM,NP name.

## 2017-03-11 IMAGING — CT CT HEAD W/O CM
1 series · 16 of 30 positions shown, 20 images · non-contrast
Comparison: 01/15/2015

CLINICAL DATA: 64-year-old male with episode 1 month ago when
patient was not very responsive and confused which lasted for 3
days. Repeat symptoms this morning. Recent cervical spine infection.
On antibiotics. Subsequent encounter.

EXAM:
CT HEAD WITHOUT CONTRAST
TECHNIQUE: Contiguous axial images were obtained from the base of the skull
through the vertex without intravenous contrast.

[Series 2: head_seq 4.5 h37s st · axial · 0.48mm/px · z∈[-115,+47]mm · 16 of 40 slices shown, 20 images]
[im 2/40  brain]
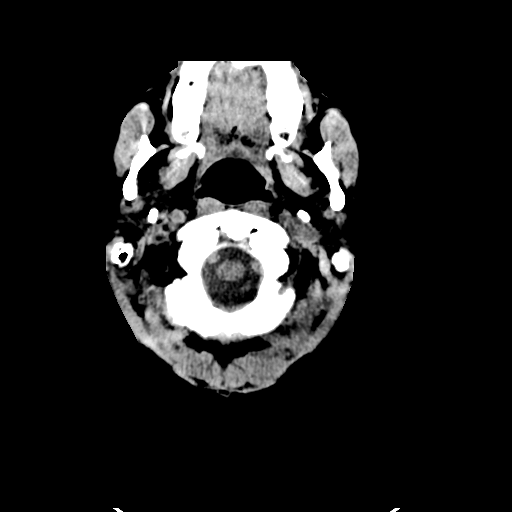
[im 2/40  bone]
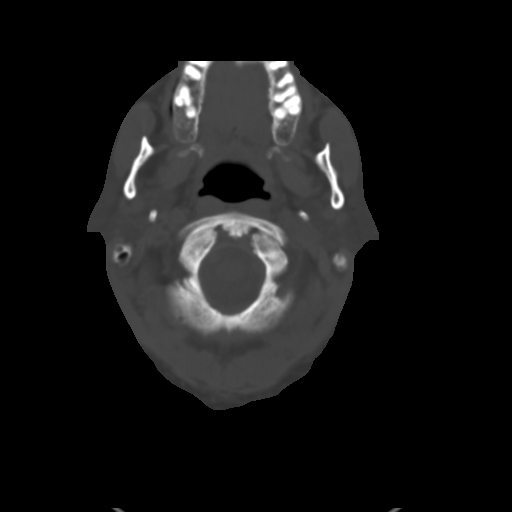
[im 5/40  brain]
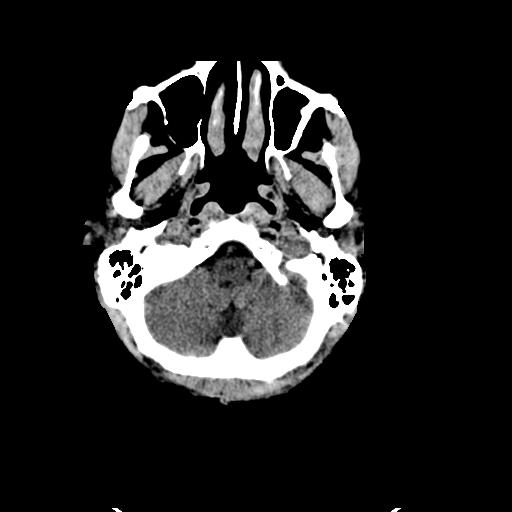
[im 7/40  brain]
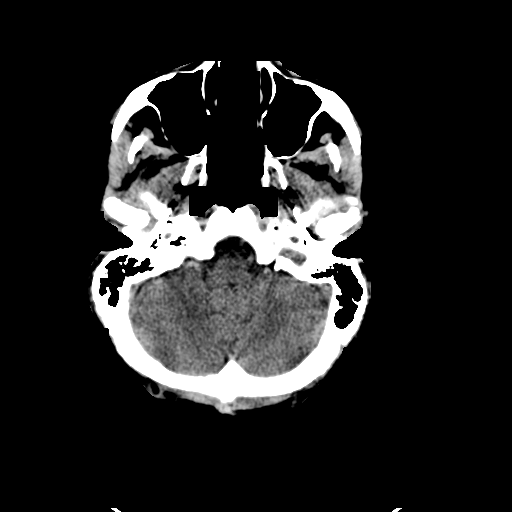
[im 10/40  brain]
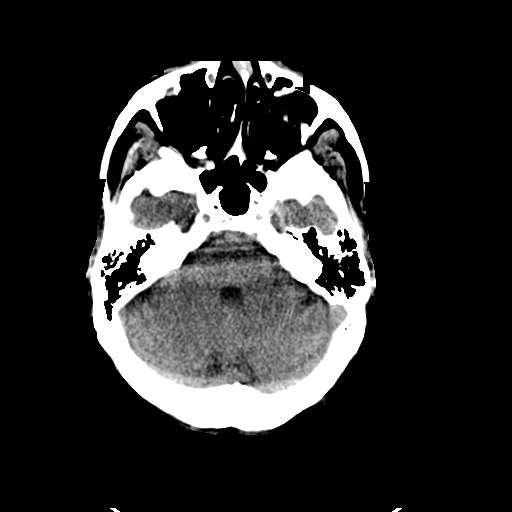
[im 11/40  brain]
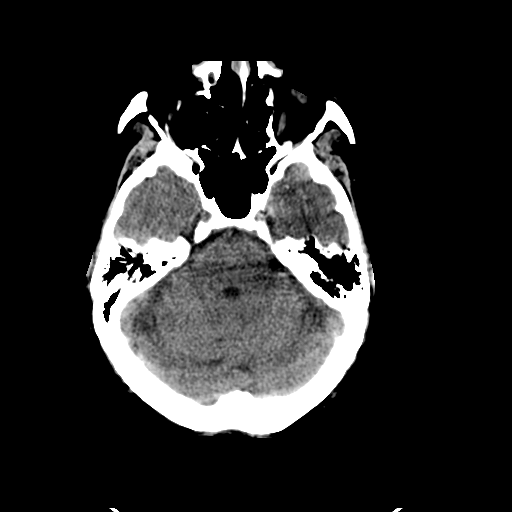
[im 11/40  bone]
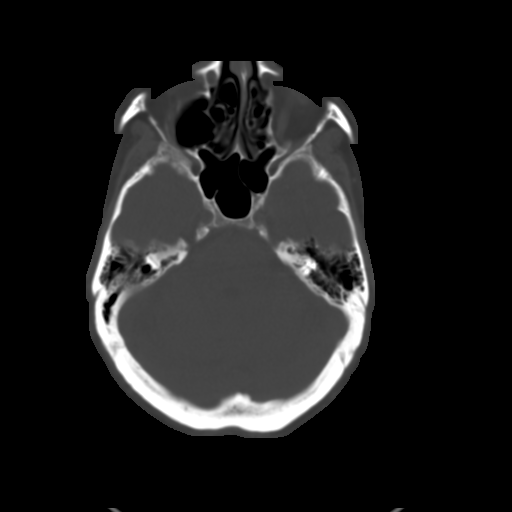
[im 14/40  brain]
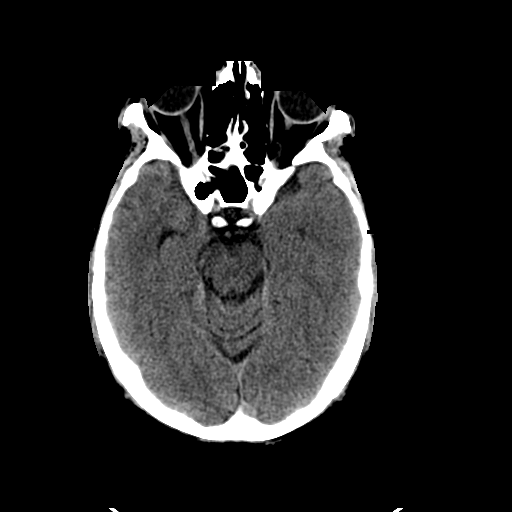
[im 17/40  brain]
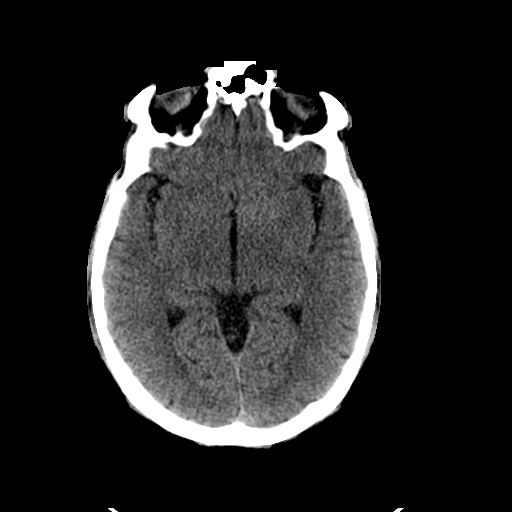
[im 19/40  brain]
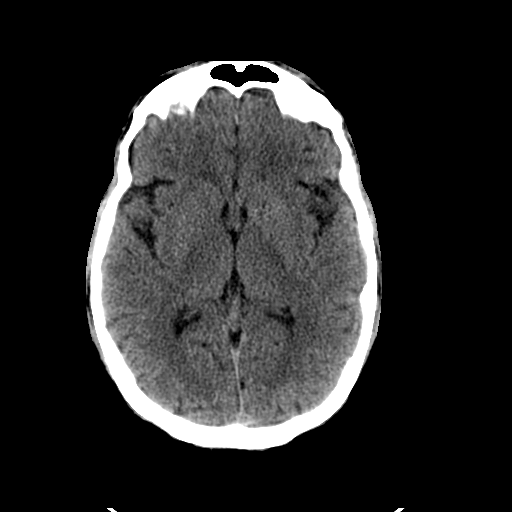
[im 21/40  brain]
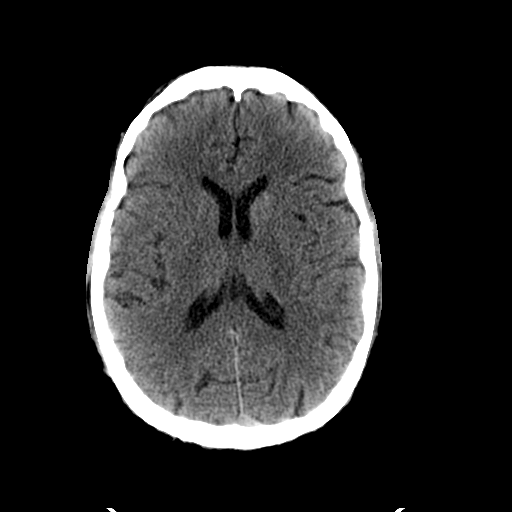
[im 21/40  bone]
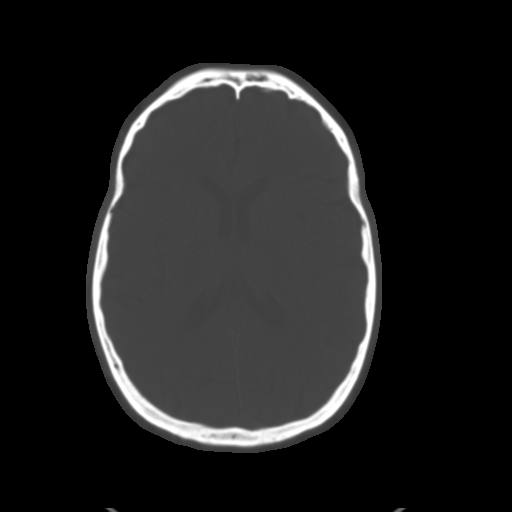
[im 23/40  brain]
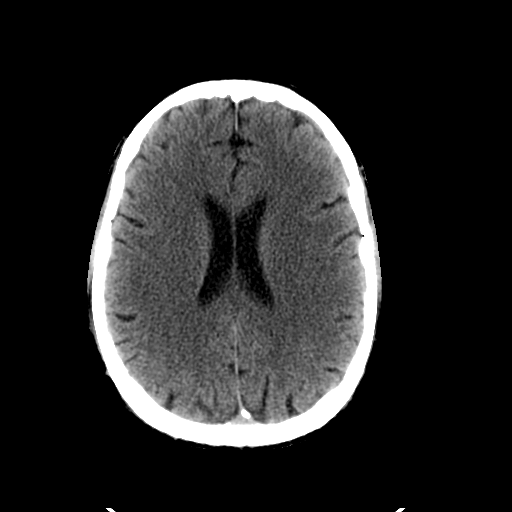
[im 26/40  brain]
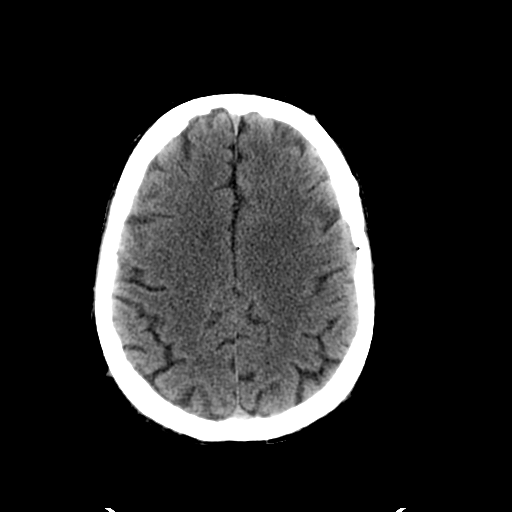
[im 29/40  brain]
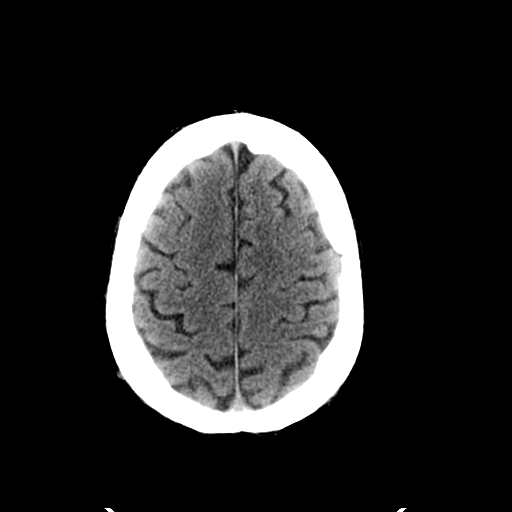
[im 30/40  brain]
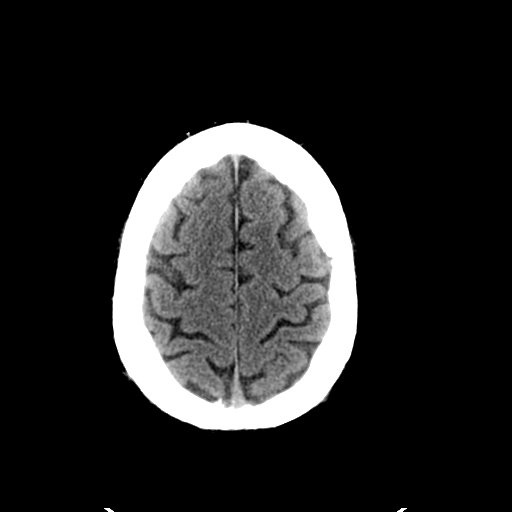
[im 30/40  bone]
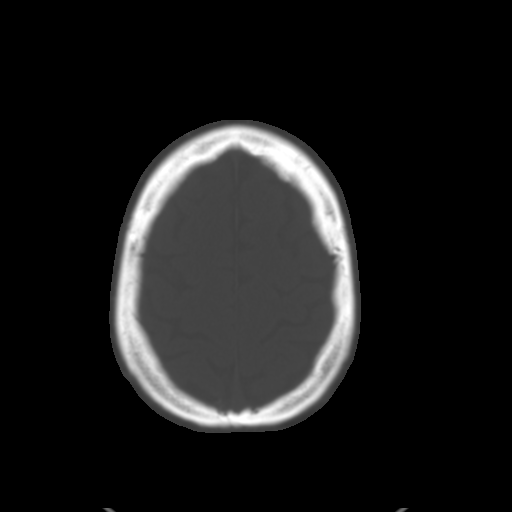
[im 33/40  brain]
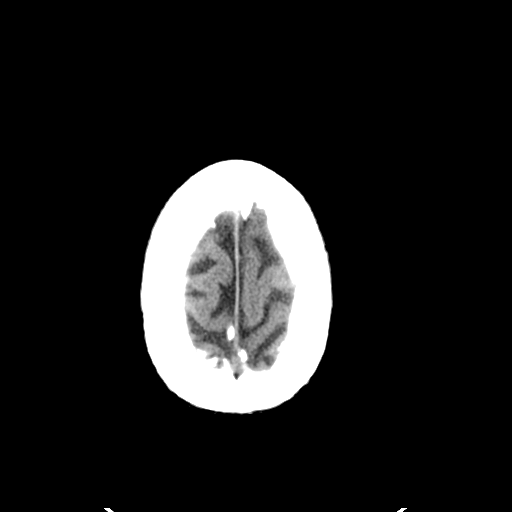
[im 35/40  brain]
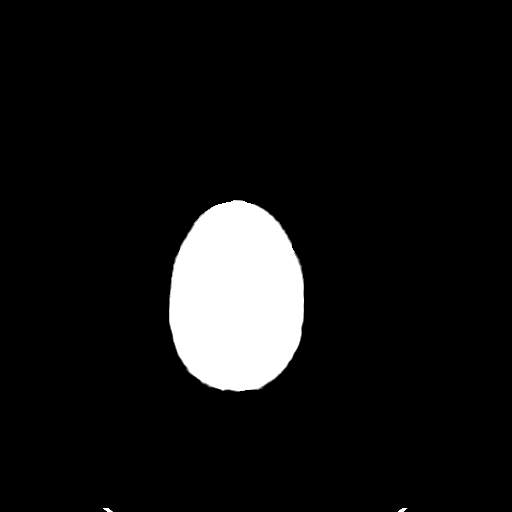
[im 38/40  brain]
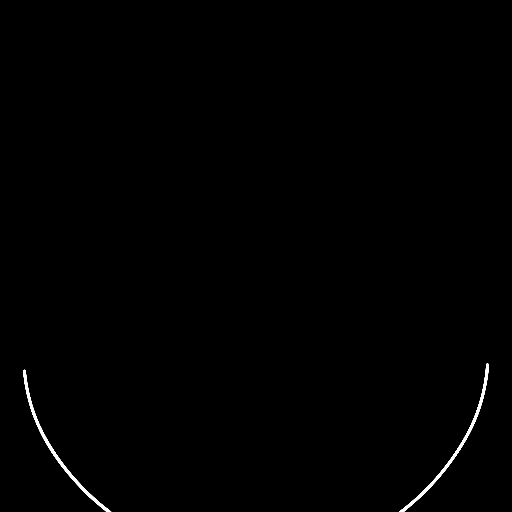

[16 of 30 positions shown; findings below may reference images not displayed]

FINDINGS: No intracranial hemorrhage.

No CT evidence of large acute infarct.

No intracranial mass lesion noted on this unenhanced exam.

No hydrocephalus.

Radiopaque structure (suggestive of metal) adjacent to the medial
aspect of the left globe once again noted.

Mastoid air cells and middle ear cavities as well as visualized
paranasal sinuses are clear.
IMPRESSION: No intracranial hemorrhage.

No CT evidence of large acute infarct.

No intracranial mass lesion noted on this unenhanced exam.

Radiopaque structure (suggestive of metal) adjacent to the medial
aspect of the left globe once again noted.

## 2017-04-20 ENCOUNTER — Other Ambulatory Visit: Payer: Self-pay | Admitting: Adult Health

## 2017-04-20 NOTE — Telephone Encounter (Signed)
Noted  

## 2017-04-20 NOTE — Telephone Encounter (Signed)
Pt's wife called he has lost [employer] insurance but is going thru a program called [employer]. They will be sending a form to fill out to cover pregabalin (LYRICA) 50 MG capsule .   FYI

## 2017-04-20 NOTE — Telephone Encounter (Signed)
[name] , Spoke to Patient's wife and [facility] will Mail In a Form that [name] will Pro bally have to Sign . [facility] Is something separate. Patient is not on Patient assistance.

## 2017-04-30 ENCOUNTER — Other Ambulatory Visit: Payer: Self-pay | Admitting: *Deleted

## 2017-04-30 MED ORDER — PREGABALIN 50 MG PO CAPS: ORAL_CAPSULE | ORAL | 1 refills | Status: DC

## 2017-05-04 ENCOUNTER — Telehealth: Payer: Self-pay | Admitting: Neurology

## 2017-05-04 NOTE — Telephone Encounter (Signed)
Mailed RX Lyrica 50 mg to [facility] . Patient 's Wife handles his Patient assistance. We Just provide RX.   Prescription  [facility] , [address] [place] [address] .

## 2017-05-11 ENCOUNTER — Telehealth: Payer: Self-pay | Admitting: Neurology

## 2017-05-11 ENCOUNTER — Telehealth: Payer: Self-pay | Admitting: Adult Health

## 2017-05-11 NOTE — Telephone Encounter (Signed)
Pt wife is asking if there is anymore pregabalin (LYRICA) 50 MG capsule that can possibly be sent to pt while they wait for pt's new insurance(Part D, which is when pt will be 67) to take affect in March of 2019

## 2017-05-11 NOTE — Telephone Encounter (Signed)
ERROR

## 2017-05-11 NOTE — Telephone Encounter (Signed)
Called patient's wife and advised her that lyrica Rx was mailed to [facility] by Rance Muir on 05/04/17. Advised she call them to check on status of Rx and call back if she needs further assistance. Wife verbalized understanding, appreciation.

## 2017-06-11 ENCOUNTER — Encounter: Payer: Self-pay | Admitting: Adult Health

## 2017-06-11 ENCOUNTER — Ambulatory Visit (INDEPENDENT_AMBULATORY_CARE_PROVIDER_SITE_OTHER): Payer: Medicare Other | Admitting: Adult Health

## 2017-06-11 VITALS — BP 111/71 | HR 60

## 2017-06-11 DIAGNOSIS — G0491 Myelitis, unspecified: Secondary | ICD-10-CM

## 2017-06-11 DIAGNOSIS — E1142 Type 2 diabetes mellitus with diabetic polyneuropathy: Secondary | ICD-10-CM | POA: Diagnosis not present

## 2017-06-11 NOTE — Progress Notes (Signed)
PATIENT: [name] DOB: Jan 26, 1951  REASON FOR VISIT: follow up- cervical myelitis and diabetic neuropathy HISTORY FROM: patient  HISTORY OF PRESENT ILLNESS: Today 06/11/17  Mr. Drue Flirt is a 66 year old male with a history of cervical myelitis and diabetic peripheral neuropathy.  He returns today for follow-up.  He continues on Lyrica 50 mg in the morning and 100 mg in the evening.  At the last visit I ordered a compounded cream for the patient, he reports that this worked well for him.  He states that near the end of his prescription the effect had worn off slightly.  The patient has not received a refill.  He is unsure if his new insurance will continue to cover the cream.  He reports that he continues to have burning and tingling in the lower extremities.  Typically his discomfort is worse at rest.  He states that he has been doing physical therapy.  He has noticed that when he is walking sometimes the left foot will turn out.  He reports rehab has been doing exercises to strengthen his hip.  He returns today for an evaluation.  HISTORY 12/10/16: Mr. Borchardt is a 66 year old male with a history of cervical myelitis and diabetic peripheral neuropathy. He returns today for follow-up. He states that Lyrica has offered him some benefit. He states if he lays down in the afternoons on the couch occasionally he will have burning and tingling in the feet and they will feel cold. He states wrapping them in a  quilt is beneficial. He denies any significant changes with his gait. Reports he continues to have a limp on the left side. Denies any falls. Denies any trouble sleeping. 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 Reports that he feels that the left arm and leg does not work as well as the right. He denies any new neurological symptoms. He returns today for an evaluation.  REVIEW OF SYSTEMS: Out of a complete 14 system review of  symptoms, the patient complains only of the following symptoms, and all other reviewed systems are negative.  ALLERGIES: No Known Allergies  HOME MEDICATIONS: Outpatient Medications Prior to Visit  Medication Sig Dispense Refill  . acetaminophen (TYLENOL) 325 MG tablet Take by mouth.    Marland Kitchen aspirin EC 81 MG tablet Take by mouth.    . carvedilol (COREG) 3.125 MG tablet Take 1 tablet by mouth 2 (two) times daily with a meal.     . HUMALOG KWIKPEN 100 UNIT/ML KiwkPen 26 units in am and at noon, 18 units at bedtime    . insulin glargine (LANTUS) 100 UNIT/ML injection Inject 0.05 mLs (5 Units total) into the skin daily. (Patient taking differently: Inject 14 Units into the skin at bedtime. ) 10 mL 11  . linaclotide (LINZESS) 290 MCG CAPS capsule Take by mouth.    Marland Kitchen lisinopril (PRINIVIL,ZESTRIL) 5 MG tablet Take 5 mg by mouth daily.    . Melatonin 3 MG TABS Take by mouth.    . mycophenolate (CELLCEPT) 250 MG capsule Take 2 capsules by mouth 2 (two) times daily.    . NONFORMULARY OR COMPOUNDED ITEM Compound cream from [employer].    . predniSONE (DELTASONE) 5 MG tablet Take 5 mg by mouth daily.     . pregabalin (LYRICA) 50 MG capsule One capsule in the morning and 2 in the evening 270 capsule 1  . senna-docusate (SENOKOT-S) 8.6-50 MG tablet  Take by mouth.    . tacrolimus (PROGRAF) 1 MG capsule Take 2 mg (2  pills) at 9 AM and 1 mg (1 pills) at 9 PM--Z94.0  Dispense Dr. Reece Levy please    . tamsulosin (FLOMAX) 0.4 MG CAPS capsule Take by mouth.    . tacrolimus (PROGRAF) 1 MG capsule Take 3 mg in the morning and 2 mg in the evening.    Marland Kitchen amLODipine (NORVASC) 5 MG tablet Take by mouth.     No facility-administered medications prior to visit.     PAST MEDICAL HISTORY: Past Medical History:  Diagnosis Date  . Abscess in epidural space of cervical spine 08/13/2015  . Acute hepatic encephalopathy   . Acute renal failure superimposed on stage 3 chronic kidney disease ([place])   . Anemia of  chronic disease   . Ascites   . Bleeding esophageal varices ([place])   . Coagulopathy ([place])   . Depression   . Diabetes mellitus type II, controlled ([place])   . Diabetic neuropathy ([place])   . Esophageal stricture   . NASH (nonalcoholic steatohepatitis)   . Protein-calorie malnutrition, severe ([place])   . Secondary biliary cirrhosis ([place])   . Thrombocytopenia ([place])     PAST SURGICAL HISTORY: Past Surgical History:  Procedure Laterality Date  . COLONOSCOPY    . ESOPHAGEAL BANDING Bilateral 11/29/2013   Procedure: ESOPHAGEAL BANDING;  Surgeon: Irene Shipper, MD;  Location: [facility];  Service: Endoscopy;  Laterality: Bilateral;  . ESOPHAGEAL BANDING N/A 06/13/2014   Procedure: ESOPHAGEAL BANDING;  Surgeon: Irene Shipper, MD;  [facility];  Service: Endoscopy;  Laterality: N/A;  . ESOPHAGOGASTRODUODENOSCOPY  08/01/2011   Procedure: ESOPHAGOGASTRODUODENOSCOPY (EGD);  Surgeon: Scarlette Shorts, MD;  Location: [facility];  Service: Endoscopy;  Laterality: N/A;  . ESOPHAGOGASTRODUODENOSCOPY N/A 10/27/2012   Procedure: ESOPHAGOGASTRODUODENOSCOPY (EGD);  Surgeon: Inda Castle, MD;  Location: [facility];  Service: Endoscopy;  Laterality: N/A;  . ESOPHAGOGASTRODUODENOSCOPY N/A 11/22/2012   Procedure: ESOPHAGOGASTRODUODENOSCOPY (EGD);  Surgeon: Irene Shipper, MD;  Location: [facility];  Service: Endoscopy;  Laterality: N/A;  . ESOPHAGOGASTRODUODENOSCOPY N/A 11/29/2013   Procedure: ESOPHAGOGASTRODUODENOSCOPY (EGD);  Surgeon: Irene Shipper, MD;  Location: [facility];  Service: Endoscopy;  Laterality: N/A;  . ESOPHAGOGASTRODUODENOSCOPY N/A 06/13/2014   Procedure: ESOPHAGOGASTRODUODENOSCOPY (EGD);  Surgeon: Irene Shipper, MD;  Location: [facility];  Service: Endoscopy;  Laterality: N/A;  . KIDNEY STONE SURGERY  yrs ago  . 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 UPPER GASTROINTESTINAL ENDOSCOPY      FAMILY HISTORY: Family History  Problem Relation Age of Onset  . Diabetes Father   . Heart disease Father    . Aneurysm Mother   . Heart disease Other        neice  . Diabetes Unknown        niece  . Colon cancer Neg Hx   . Anesthesia problems Neg Hx   . Hypotension Neg Hx   . Malignant hyperthermia Neg Hx   . Pseudochol deficiency Neg Hx     SOCIAL HISTORY: Social History   Socioeconomic History  . Marital status: Married    Spouse name: Not on file  . Number of children: 1  . Years of education: 19  . Highest education level: Not on file  Social Needs  . Financial resource strain: Not on file  . Food insecurity - worry: Not on file  . Food insecurity - inability: Not on file  . Transportation needs - medical: Not on file  .  Transportation needs - non-medical: Not on file  Occupational History  . Occupation: Retired  Tobacco Use  . Smoking status: Never Smoker  . Smokeless tobacco: Never Used  Substance and Sexual Activity  . Alcohol use: No    Alcohol/week: 0.0 oz    Comment: very rare  . Drug use: No  . Sexual activity: No  Other Topics Concern  . Not on file  Social History Narrative   Lives at home w/ his wife and 2 poodles   Patient drinks caffeine 1-2 times per week.   Patient is right handed.       PHYSICAL EXAM  Vitals:   06/11/17 0855  BP: 111/71  Pulse: 60   There is no height or weight on file to calculate BMI.  Generalized: Well developed, in no acute distress   Neurological examination  Mentation: Alert oriented to time, place, history taking. Follows all commands speech and language fluent Cranial nerve II-XII: Pupils were equal round reactive to light. Extraocular movements were full, visual field were full on confrontational test. Facial sensation and strength were normal. Uvula tongue midline. Head turning and shoulder shrug  were normal and symmetric. Motor: The motor testing reveals 5 over 5 strength of all 4 extremities. Good symmetric motor tone is noted throughout.  Sensory: Sensory testing is intact to soft touch on all 4 extremities.  No evidence of extinction is noted.  Coordination: Cerebellar testing reveals good finger-nose-finger and heel-to-shin bilaterally.  Gait and station: Gait is slightly unsteady.  Tandem gait not attempted.  Romberg is negative.   DIAGNOSTIC DATA (LABS, IMAGING, TESTING) - I reviewed patient records, labs, notes, testing and imaging myself where available.  Lab Results  Component Value Date   WBC 2.5 (L) 09/20/2015   HGB 9.4 (L) 09/20/2015   HCT 29.3 (L) 09/20/2015   MCV 89.3 09/20/2015   PLT 28 (LL) 09/20/2015      Component Value Date/Time   NA 138 09/20/2015 0540   K 4.0 09/20/2015 0540   CL 100 (L) 09/20/2015 0540   CO2 28 09/20/2015 0540   GLUCOSE 232 (H) 09/20/2015 0540   BUN 64 (H) 09/20/2015 0540   CREATININE 3.05 (H) 09/20/2015 0540   CREATININE 2.21 (H) 08/13/2015 1530   CALCIUM 8.8 (L) 09/20/2015 0540   PROT 6.7 02/06/2016 1550   ALBUMIN 3.5 09/20/2015 0540   AST 22 09/20/2015 0540   ALT 13 (L) 09/20/2015 0540   ALKPHOS 49 09/20/2015 0540   BILITOT 2.4 (H) 09/20/2015 0540   GFRNONAA 20 (L) 09/20/2015 0540   GFRNONAA 31 (L) 08/13/2015 1530   GFRAA 24 (L) 09/20/2015 0540   GFRAA 36 (L) 08/13/2015 1530   Lab Results  Component Value Date   CHOL  05/13/2008    106        ATP III CLASSIFICATION:  <200     mg/dL   Desirable  200-239  mg/dL   Borderline High  >=240    mg/dL   High   HDL 25 (L) 05/13/2008   LDLCALC  05/13/2008    66        Total Cholesterol/HDL:CHD Risk Coronary Heart Disease Risk Table                     Men   Women  1/2 Average Risk   3.4   3.3   TRIG 73 05/13/2008   CHOLHDL 4.2 05/13/2008   Lab Results  Component Value Date   HGBA1C 6.6 (H) 09/06/2015  Lab Results  Component Value Date   VITAMINB12 611 02/06/2016   Lab Results  Component Value Date   TSH 1.748 07/25/2015      ASSESSMENT AND PLAN 66 y.o. year old male  has a past medical history of Abscess in epidural space of cervical spine (08/13/2015), Acute hepatic  encephalopathy, Acute renal failure superimposed on stage 3 chronic kidney disease ([place]), Anemia of chronic disease, Ascites, Bleeding esophageal varices ([place]), Coagulopathy ([place]), Depression, Diabetes mellitus type II, controlled ([place]), Diabetic neuropathy ([place]), Esophageal stricture, NASH (nonalcoholic steatohepatitis), Protein-calorie malnutrition, severe ([place]), Secondary biliary cirrhosis ([place]), and Thrombocytopenia ([place]). here with:  1.  Cervical myelitis 2.  Diabetic peripheral neuropathy  Overall the patient has remained stable.  He will continue on Lyrica.  He reports that the compounded cream worked well however he has not received a refill.  He plans to call the company to see if his new insurance will continue to cover this.  Advised that if his symptoms worsen or he develops new symptoms he should let us know.  He will follow-up in 6 months or sooner if needed.    Ward Givens, MSN, NP-C 06/11/2017, 9:03 AM [facility] [address], [place], [place] [address] [phone]

## 2017-06-11 NOTE — Progress Notes (Signed)
I have read the note, and I agree with the clinical assessment and plan.  Charles K Willis   

## 2017-06-11 NOTE — Patient Instructions (Signed)
Your Plan:  Continue Lyrica Call to get refill on compound cream- if too expensive let me know If your symptoms worsen or you develop new symptoms please let us know.   Thank you for coming to see [place] at [facility]. I hope we have been able to provide you high quality care today.  You may receive a patient satisfaction survey over the next few weeks. We would appreciate your feedback and comments so that we may continue to improve ourselves and the health of our patients.

## 2017-09-08 ENCOUNTER — Telehealth: Payer: Self-pay | Admitting: *Deleted

## 2017-09-08 MED ORDER — PREGABALIN 50 MG PO CAPS: ORAL_CAPSULE | ORAL | 1 refills | Status: DC

## 2017-09-08 NOTE — Telephone Encounter (Signed)
Received fax from [facility] for Lyrica refill. Rx to be printed and mailed to [facility] per fax instructions. Rx printed, on NP's desk for signature.

## 2017-09-08 NOTE — Telephone Encounter (Signed)
Lyrica refill Rx signed, sent to Medical records to be mailed to  [facility].

## 2017-09-13 IMAGING — CR DG CHEST 2V
3 series · 3 of 3 positions shown · non-contrast
Comparison: 07/10/2015

CLINICAL DATA: Progressive abdominal and chest pain since this
morning. Shortness of breath. Ascites and abdominal distention.

EXAM:
CHEST  2 VIEW

[w chest lat (1 of 2)]
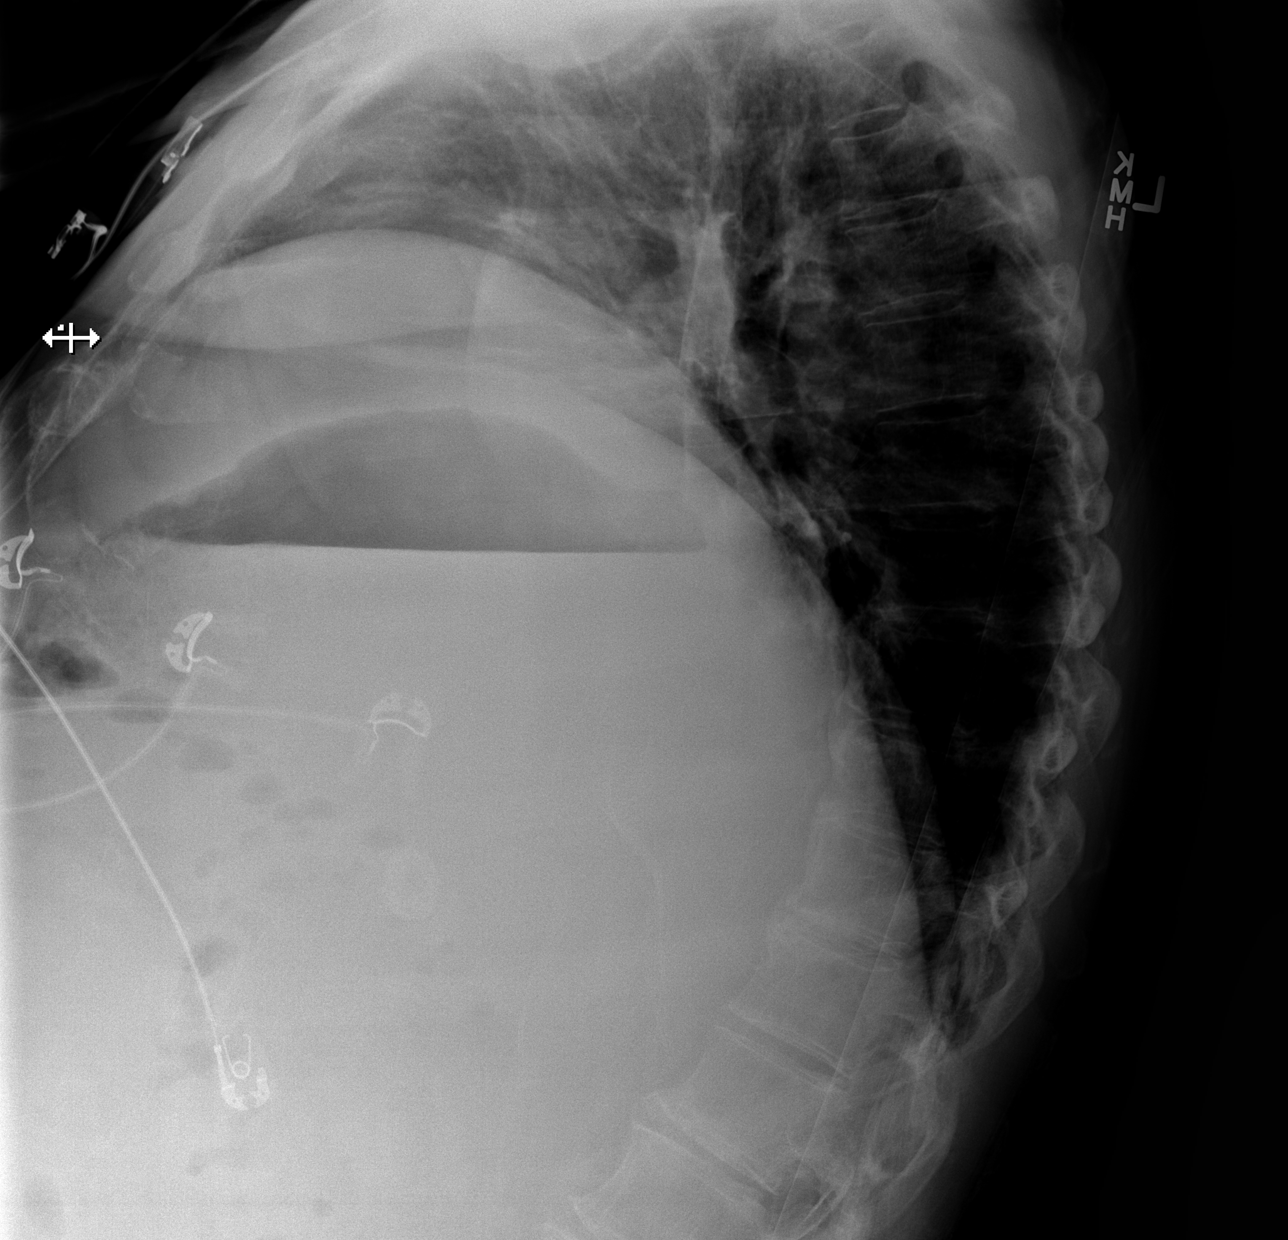

[w chest lat (2 of 2)]
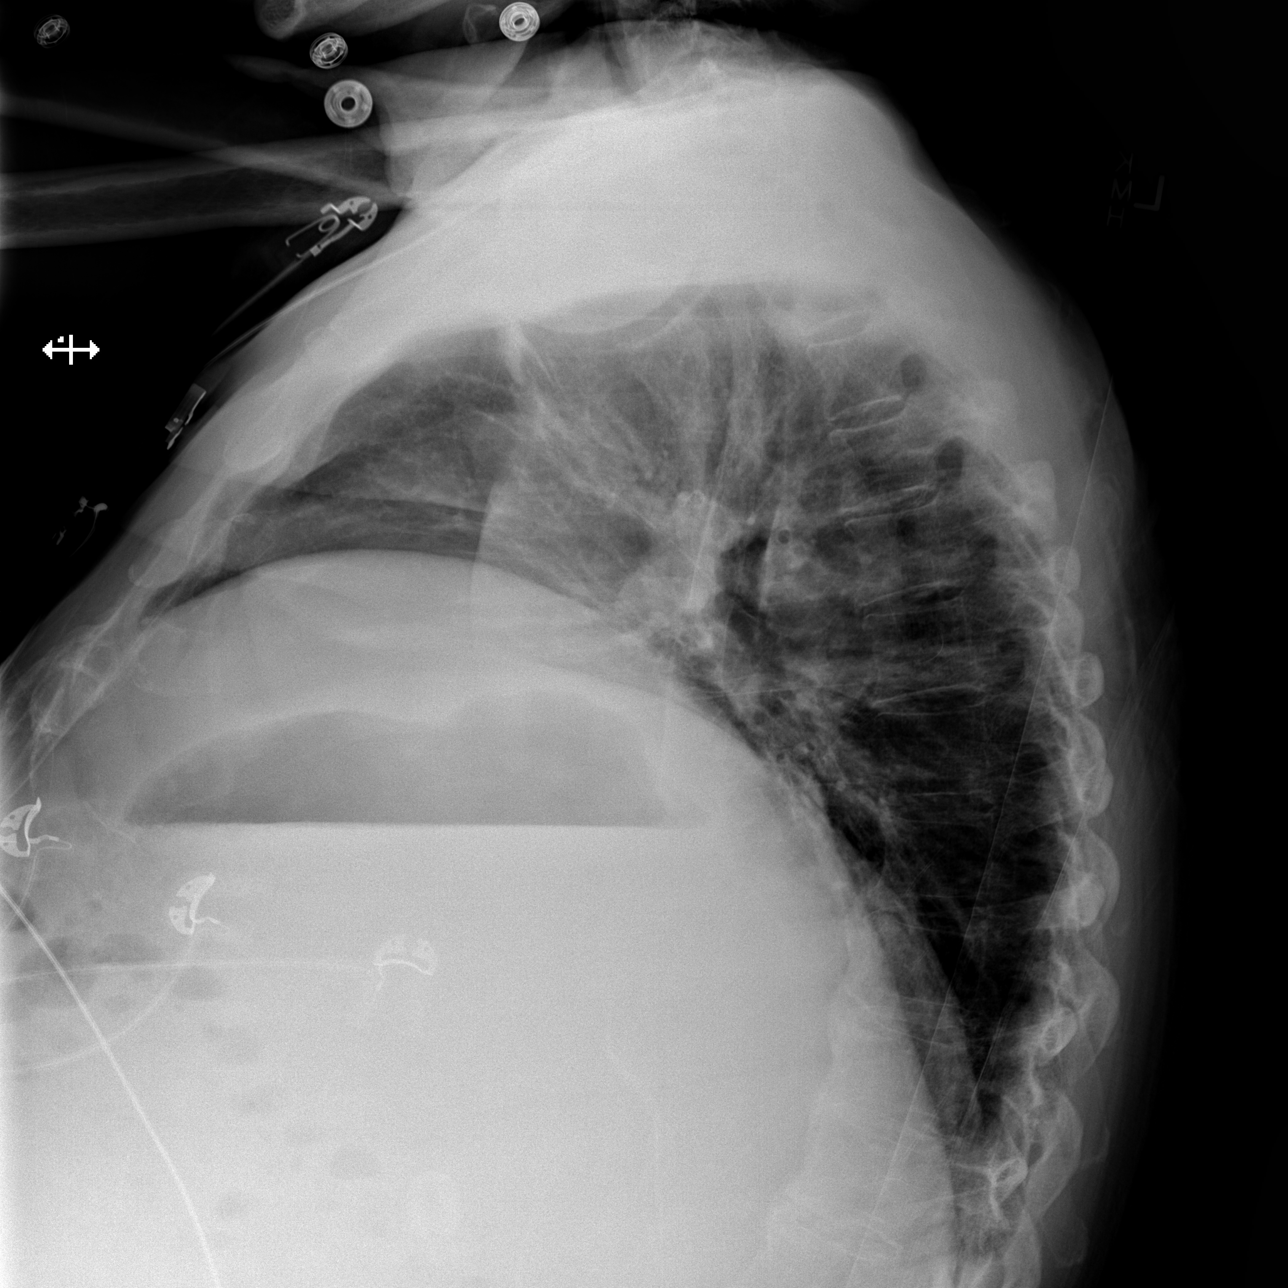

[x chest ap]
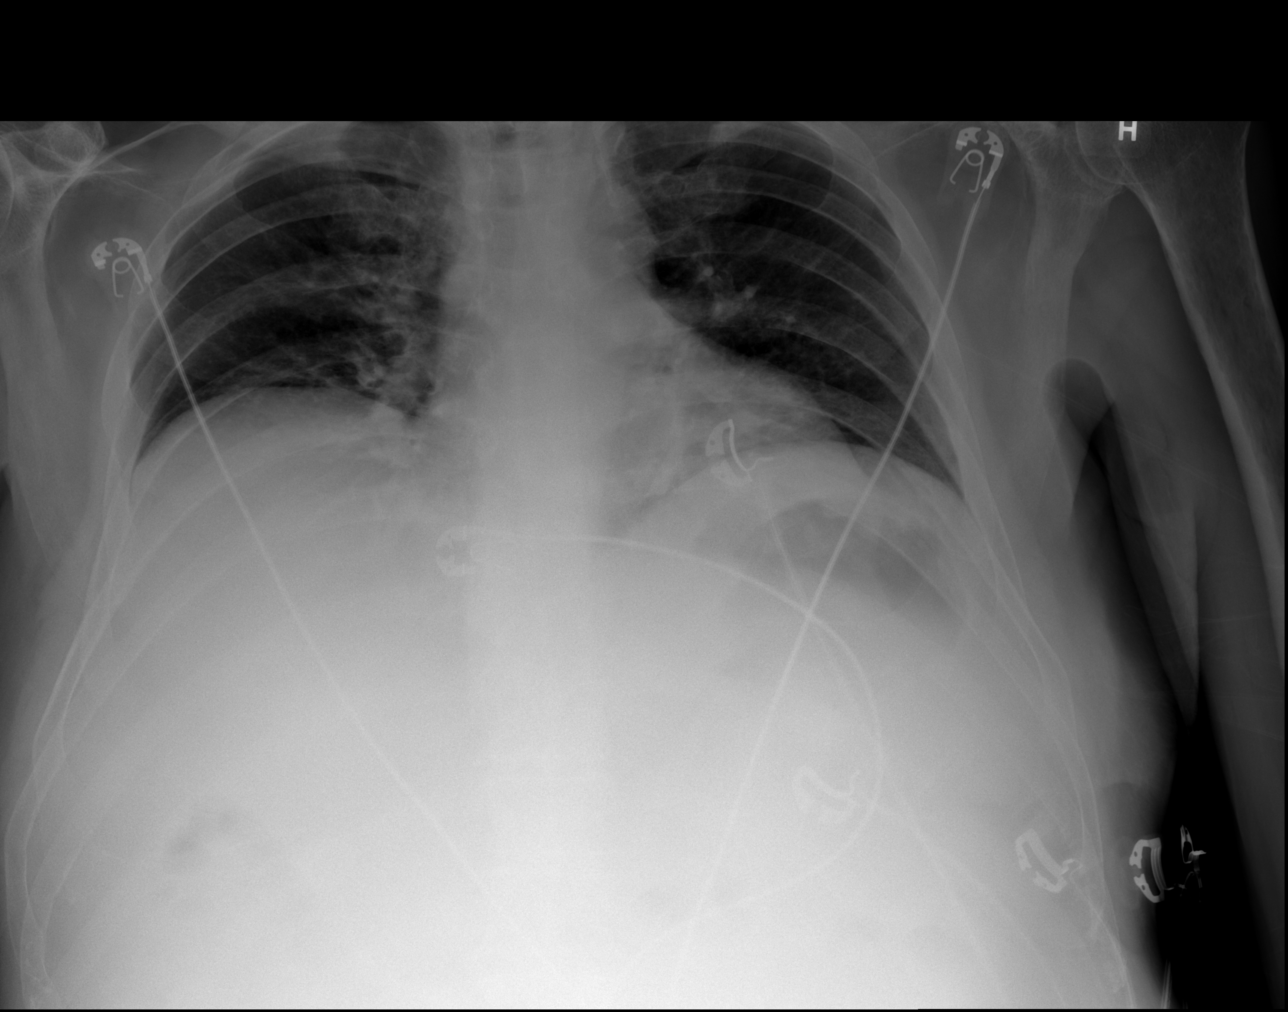

[3 of 3 positions shown; findings below may reference images not displayed]

FINDINGS: Very low lung volumes are again seen with bibasilar atelectasis. No
evidence of pulmonary consolidation or pleural effusion. No
pneumothorax visualized. Heart size is within normal limits.
IMPRESSION: Very low lung volumes with bibasilar atelectasis.

## 2017-10-06 ENCOUNTER — Ambulatory Visit (INDEPENDENT_AMBULATORY_CARE_PROVIDER_SITE_OTHER): Payer: Medicare Other | Admitting: Internal Medicine

## 2017-10-06 ENCOUNTER — Encounter: Payer: Self-pay | Admitting: Internal Medicine

## 2017-10-06 VITALS — BP 114/70 | HR 64 | Ht 70.0 in | Wt 254.0 lb

## 2017-10-06 DIAGNOSIS — E119 Type 2 diabetes mellitus without complications: Secondary | ICD-10-CM | POA: Diagnosis not present

## 2017-10-06 DIAGNOSIS — Z860100 Personal history of colon polyps, unspecified: Secondary | ICD-10-CM

## 2017-10-06 DIAGNOSIS — K5900 Constipation, unspecified: Secondary | ICD-10-CM

## 2017-10-06 DIAGNOSIS — IMO0001 Reserved for inherently not codable concepts without codable children: Secondary | ICD-10-CM

## 2017-10-06 DIAGNOSIS — Z794 Long term (current) use of insulin: Secondary | ICD-10-CM | POA: Diagnosis not present

## 2017-10-06 MED ORDER — NA SULFATE-K SULFATE-MG SULF 17.5-3.13-1.6 GM/177ML PO SOLN: 1.0000 | Freq: Once | ORAL | 0 refills | Status: AC

## 2017-10-06 NOTE — Patient Instructions (Signed)
You have been scheduled for a colonoscopy. Please follow written instructions given to you at your visit today.  Please pick up your prep supplies at the pharmacy within the next 1-3 days. If you use inhalers (even only as needed), please bring them with you on the day of your procedure. Your physician has requested that you go to www.startemmi.com and enter the access code given to you at your visit today. This web site gives a general overview about your procedure. However, you should still follow specific instructions given to you by our office regarding your preparation for the procedure.   Take Miralax daily

## 2017-10-06 NOTE — Progress Notes (Signed)
HISTORY OF PRESENT ILLNESS:  [name] is a 67 y.o. 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 He was last seen in this office 06/20/2016. See that dictation. He is status post successful liver kidney transplantation at [facility] in May 2017. He has been followed closely there for monitoring of his kidney, liver, and diabetes.he presents today with chief complaint of ongoing constipation and the need for surveillance colonoscopy. He is accompanied by his wife. In addition to immunosuppression from his kidney disease he is on insulin for his diabetes. He is morbidly obese. For his constipation he tells me that he had been treated previously with linzess and Amitiza. These resulted in unacceptable diarrhea. He has not tried MiraLAX regularly as previously recommended. Patient has a history of advanced adenomas including high grade dysplasia on his index examination in 2004. Subsequent examinations in 2006, 2008, and most recently 2013. At the time of his last colonoscopy he was found to have a 1.2 cm pedunculated polyp which was removed and found to be a tubulovillous adenoma. Follow-up in 3 years recommended. Scheduled follow-up was not performed due to significant intervening medical illnesses.  REVIEW OF SYSTEMS:  All non-GI ROS negative unless otherwise stated in the history of present illness except for fatigue, sleeping problems, ankle edema, sinus and allergy  Past Medical History:  Diagnosis Date  . Abscess in epidural space of cervical spine 08/13/2015  . Acute hepatic encephalopathy   . Acute renal failure superimposed on stage 3 chronic kidney disease ([place])   . Anemia of chronic disease   . Ascites   . Bleeding esophageal varices ([place])   . Coagulopathy ([place])   . Depression   . Diabetes mellitus type II, controlled ([place])   . Diabetic neuropathy ([place])   .  Esophageal stricture   . NASH (nonalcoholic steatohepatitis)   . Protein-calorie malnutrition, severe ([place])   . Secondary biliary cirrhosis ([place])   . Thrombocytopenia ([place])     Past Surgical History:  Procedure Laterality Date  . COLONOSCOPY    . ESOPHAGEAL BANDING Bilateral 11/29/2013   Procedure: ESOPHAGEAL BANDING;  Surgeon: Irene Shipper, MD;  [facility]: [facility];  Service: Endoscopy;  Laterality: Bilateral;  . ESOPHAGEAL BANDING N/A 06/13/2014   Procedure: ESOPHAGEAL BANDING;  Surgeon: Irene Shipper, MD;  Location: [facility];  Service: Endoscopy;  Laterality: N/A;  . ESOPHAGOGASTRODUODENOSCOPY  08/01/2011   Procedure: ESOPHAGOGASTRODUODENOSCOPY (EGD);  Surgeon: Scarlette Shorts, MD;  Location: [facility];  Service: Endoscopy;  Laterality: N/A;  . ESOPHAGOGASTRODUODENOSCOPY N/A 10/27/2012   Procedure: ESOPHAGOGASTRODUODENOSCOPY (EGD);  Surgeon: Inda Castle, MD;  Location: [facility];  Service: Endoscopy;  Laterality: N/A;  . ESOPHAGOGASTRODUODENOSCOPY N/A 11/22/2012   Procedure: ESOPHAGOGASTRODUODENOSCOPY (EGD);  Surgeon: Irene Shipper, MD;  Location: [facility];  Service: Endoscopy;  Laterality: N/A;  . ESOPHAGOGASTRODUODENOSCOPY N/A 11/29/2013   Procedure: ESOPHAGOGASTRODUODENOSCOPY (EGD);  Surgeon: Irene Shipper, MD;  Location: [facility];  Service: Endoscopy;  Laterality: N/A;  . ESOPHAGOGASTRODUODENOSCOPY N/A 06/13/2014   Procedure: ESOPHAGOGASTRODUODENOSCOPY (EGD);  Surgeon: Irene Shipper, MD;  Location: [facility];  Service: Endoscopy;  Laterality: N/A;  . KIDNEY STONE SURGERY  yrs ago  . 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 UPPER GASTROINTESTINAL ENDOSCOPY      Social History [name]  reports that he has never smoked. He has never used smokeless tobacco. He reports that he does not drink  alcohol or use drugs.  family history includes Aneurysm in his mother; Diabetes in his father and unknown relative; Heart disease in his father and other.  No Known Allergies      PHYSICAL EXAMINATION: Vital signs: BP 114/70 (BP Location: Left Arm, Patient Position: Sitting, Cuff Size: Normal)   Pulse 64   Ht 5\' 10"  (1.778 m) Comment: height measured without shoes  Wt 254 lb (115.2 kg)   BMI 36.45 kg/m   Constitutional: obese, pleasant,generally well-appearing, no acute distress Psychiatric: alert and oriented x3, cooperative Eyes: extraocular movements intact, anicteric, conjunctiva pink Mouth: oral pharynx moist, no lesions Neck: supple without thyromegaly Lymph: no lymphadenopathy Cardiovascular: heart regular rate and rhythm, no murmur Lungs: clear to auscultation bilaterally Abdomen: soft, obese, nontender, nondistended, no obvious ascites, no peritoneal signs, normal bowel sounds, no organomegaly Rectal: deferred until colonoscopy Extremities: no clubbing or cyanosis. 1+ lower extremity edema bilaterally Skin: no lesions on visible extremities Neuro: No focal deficits. No asterixis.   ASSESSMENT:  #1. Chronic constipation #2. History of advanced adenomatous colon polyps. Overdue for surveillance #3. Status post kidney liver transplantation at [facility] May 2017 #4. Morbid obesity #5. Insulin requiring diabetes mellitus   PLAN:  #1. MiraLAX daily to achieve regular bowel movements. The proper way to titrate was carefully reviewed #2. Schedule surveillance colonoscopy. The patient is RISK GIVEN HIS BODY HABITUS, INSULIN REQUIRING DIABETES WITH THE NEED TO ADJUST MEDICATION, AND CHRONIC IMMUNOSUPPRESSION. The nature of the procedure, as well as the risks, benefits, and alternatives were carefully and thoroughly reviewed with the patient. Ample time for discussion and questions allowed. The patient understood, was satisfied, and agreed to proceed. #3. Advised to take 20 units of his bedtime insulin (as opposed to 25) and hold a.m. Insulin as he will be nothing by mouth #4. Ongoing close follow-up of his overall medical care at [facility]

## 2017-10-08 ENCOUNTER — Telehealth: Payer: Self-pay | Admitting: Internal Medicine

## 2017-10-09 NOTE — Telephone Encounter (Signed)
Spoke with patient's wife and told her that since I had several samples of Plenvu I would change patient's instructions and give him a free Plenvu instead.  I stated I would put new instructions and the sample up front to be picked up.  Patient's wife agreed.

## 2017-12-08 ENCOUNTER — Telehealth: Payer: Self-pay | Admitting: Adult Health

## 2017-12-08 NOTE — Telephone Encounter (Signed)
Pt wife(on DPR) has called to inform pt was at [facility] for a follow up re: his liver.  Wife states he mentioned to the staff he has a funny feeling in his right hand.  Wife states the staff was going to refer him to a Neurologist.  Wife informed the staff he is a [facility] pt.  They suggested to mention to staff concern with hand.  Wife would like his hand to also be looked at on appointment on 06-10.  Wife is not asking for a cal back.  She wants to discuss more on visit.

## 2017-12-10 ENCOUNTER — Ambulatory Visit (AMBULATORY_SURGERY_CENTER): Payer: Medicare Other | Admitting: Internal Medicine

## 2017-12-10 ENCOUNTER — Other Ambulatory Visit: Payer: Self-pay

## 2017-12-10 ENCOUNTER — Encounter: Payer: Self-pay | Admitting: Internal Medicine

## 2017-12-10 VITALS — BP 140/69 | HR 56 | Temp 97.5°F | Resp 10 | Ht 70.0 in | Wt 254.0 lb

## 2017-12-10 DIAGNOSIS — K5900 Constipation, unspecified: Secondary | ICD-10-CM

## 2017-12-10 DIAGNOSIS — Z86010 Personal history of colonic polyps: Secondary | ICD-10-CM

## 2017-12-10 MED ORDER — SODIUM CHLORIDE 0.9 % IV SOLN: 500.0000 mL | Freq: Once | INTRAVENOUS | Status: DC

## 2017-12-10 NOTE — Patient Instructions (Signed)
YOU HAD AN ENDOSCOPIC PROCEDURE TODAY AT THE [facility]:   Refer to the procedure report that was given to you for any specific questions about what was found during the examination.  If the procedure report does not answer your questions, please call your gastroenterologist to clarify.  If you requested that your care partner not be given the details of your procedure findings, then the procedure report has been included in a sealed envelope for you to review at your convenience later.  YOU SHOULD EXPECT: Some feelings of bloating in the abdomen. Passage of more gas than usual.  Walking can help get rid of the air that was put into your GI tract during the procedure and reduce the bloating. If you had a lower endoscopy (such as a colonoscopy or flexible sigmoidoscopy) you may notice spotting of blood in your stool or on the toilet paper. If you underwent a bowel prep for your procedure, you may not have a normal bowel movement for a few days.  Please Note:  You might notice some irritation and congestion in your nose or some drainage.  This is from the oxygen used during your procedure.  There is no need for concern and it should clear up in a day or so.  SYMPTOMS TO REPORT IMMEDIATELY:   Following lower endoscopy (colonoscopy or flexible sigmoidoscopy):  Excessive amounts of blood in the stool  Significant tenderness or worsening of abdominal pains  Swelling of the abdomen that is new, acute  Fever of 100F or higher  For urgent or emergent issues, a gastroenterologist can be reached at any hour by calling [phone].   DIET:  We do recommend a small meal at first, but then you may proceed to your regular diet.  Drink plenty of fluids but you should avoid alcoholic beverages for 24 hours.  ACTIVITY:  You should plan to take it easy for the rest of today and you should NOT DRIVE or use heavy machinery until tomorrow (because of the sedation medicines used during the test).     FOLLOW UP: Our staff will call the number listed on your records the next business day following your procedure to check on you and address any questions or concerns that you may have regarding the information given to you following your procedure. If we do not reach you, we will leave a message.  However, if you are feeling well and you are not experiencing any problems, there is no need to return our call.  We will assume that you have returned to your regular daily activities without incident.  If any biopsies were taken you will be contacted by phone or by letter within the next 1-3 weeks.  Please call us at [phone] if you have not heard about the biopsies in 3 weeks.    SIGNATURES/CONFIDENTIALITY: You and/or your care partner have signed paperwork which will be entered into your electronic medical record.  These signatures attest to the fact that that the information above on your After Visit Summary has been reviewed and is understood.  Full responsibility of the confidentiality of this discharge information lies with you and/or your care-partner. 

## 2017-12-10 NOTE — Progress Notes (Signed)
Report to PACU, RN, vss, BBS= Clear.  

## 2017-12-10 NOTE — Op Note (Signed)
[facility] Patient Name: [name] Procedure Date: 12/10/2017 11:58 AM MRN: [id number] Endoscopist: Docia Chuck. [name] , MD Age: 67 Referring MD:  Date of Birth: Sep 10, 1950 Gender: Male Account #: [id number] Procedure:                Colonoscopy Indications:              High risk colon cancer surveillance: Personal                            history of adenoma (10 mm or greater in size), High                            risk colon cancer surveillance: Personal history of                            adenoma with villous component, High risk colon                            cancer surveillance: Personal history of multiple                            (3 or more) adenomas. Previous examinations 2006,                            2008, 2013 Medicines:                Monitored Anesthesia Care Procedure:                Pre-Anesthesia Assessment:                           - Prior to the procedure, a History and Physical                            was performed, and patient medications and                            allergies were reviewed. The patient's tolerance of                            previous anesthesia was also reviewed. The risks                            and benefits of the procedure and the sedation                            options and risks were discussed with the patient.                            All questions were answered, and informed consent                            was obtained. Prior Anticoagulants: The patient has  taken no previous anticoagulant or antiplatelet                            agents. ASA Grade Assessment: III - A patient with                            severe systemic disease. After reviewing the risks                            and benefits, the patient was deemed in                            satisfactory condition to undergo the procedure.                           After obtaining informed consent, the colonoscope                           was passed under direct vision. Throughout the                            procedure, the patient's blood pressure, pulse, and                            oxygen saturations were monitored continuously. The                            Colonoscope was introduced through the anus and                            advanced to the the cecum, identified by                            appendiceal orifice and ileocecal valve. The                            ileocecal valve, appendiceal orifice, and rectum                            were photographed. The quality of the bowel                            preparation was excellent. The colonoscopy was                            performed without difficulty. The patient tolerated                            the procedure well. The bowel preparation used was                            SUPREP. Scope In: 12:06:21 PM Scope Out: 12:19:25 PM Scope Withdrawal Time: 0 hours 11 minutes 3 seconds  Total Procedure Duration: 0 hours 13 minutes 4 seconds  Findings:                 The entire examined colon appeared normal on direct                            and retroflexion views. Complications:            No immediate complications. Estimated blood loss:                            None. Estimated Blood Loss:     Estimated blood loss: none. Impression:               - The entire examined colon is normal on direct and                            retroflexion views.                           - No specimens collected. Recommendation:           - Repeat colonoscopy in 5 years for surveillance.                           - Patient has a contact number available for                            emergencies. The signs and symptoms of potential                            delayed complications were discussed with the                            patient. Return to normal activities tomorrow.                            Written discharge instructions were  provided to the                            patient.                           - Resume previous diet.                           - Continue present medications. Docia Chuck. [name], MD 12/10/2017 12:26:05 PM This report has been signed electronically.

## 2017-12-11 ENCOUNTER — Telehealth: Payer: Self-pay

## 2017-12-11 ENCOUNTER — Telehealth: Payer: Self-pay | Admitting: *Deleted

## 2017-12-11 NOTE — Telephone Encounter (Signed)
  Follow up Call-  Call back number 12/10/2017  Post procedure Call Back phone  # [phone]  Permission to leave phone message Yes  Some recent data might be hidden     Left message on 2nd f/u call

## 2017-12-11 NOTE — Telephone Encounter (Signed)
Left message on f/u call 

## 2017-12-14 ENCOUNTER — Ambulatory Visit (INDEPENDENT_AMBULATORY_CARE_PROVIDER_SITE_OTHER): Payer: Medicare Other | Admitting: Adult Health

## 2017-12-14 ENCOUNTER — Encounter: Payer: Self-pay | Admitting: Adult Health

## 2017-12-14 VITALS — BP 131/80 | HR 76 | Ht 70.0 in | Wt 257.6 lb

## 2017-12-14 DIAGNOSIS — E1142 Type 2 diabetes mellitus with diabetic polyneuropathy: Secondary | ICD-10-CM | POA: Diagnosis not present

## 2017-12-14 DIAGNOSIS — R200 Anesthesia of skin: Secondary | ICD-10-CM

## 2017-12-14 NOTE — Patient Instructions (Signed)
Your Plan:  Continue Lyrica Compound cream will be ordered If your symptoms worsen or you develop new symptoms please let us know.   Thank you for coming to see [place] at [facility]. I hope we have been able to provide you high quality care today.  You may receive a patient satisfaction survey over the next few weeks. We would appreciate your feedback and comments so that we may continue to improve ourselves and the health of our patients.

## 2017-12-14 NOTE — Progress Notes (Signed)
I have read the note, and I agree with the clinical assessment and plan.  Kiran Carline K Chrishawn Boley   

## 2017-12-14 NOTE — Progress Notes (Signed)
PATIENT: [name] DOB: 03-31-1951  REASON FOR VISIT: follow up HISTORY FROM: patient  HISTORY OF PRESENT ILLNESS: Today 12/14/17 [name] is a 67 year old male with a history of cervical myelitis and diabetic peripheral neuropathy.  He returns today for follow-up.  He continues on Lyrica 50 mg in the morning and 100 mg in the evening.  He reports that this works well for him.  He states that he typically gets discomfort in the evenings when he sits down.  He states that he has a burning tingling pain that radiates from the feet up to the knees bilaterally.  He denies any changes with his gait or balance.  Denies any recent falls.  In the past he has used a compounded cream with good benefit.  He has not had this refilled.  He does have sleep apnea and this is followed at [facility].  The patient reports that he has been having numbness in the right hand.  This typically occurs when he wakes up in the morning.  He has noticed that throughout the day if his hand is been the same position for a long period of time. He returns today for evaluation.  HISTORY 06/11/17  [name] is a 67 year old male with a history of cervical myelitis and diabetic peripheral neuropathy.  He returns today for follow-up.  He continues on Lyrica 50 mg in the morning and 100 mg in the evening.  At the last visit I ordered a compounded cream for the patient, he reports that this worked well for him.  He states that near the end of his prescription the effect had worn off slightly.  The patient has not received a refill.  He is unsure if his new insurance will continue to cover the cream.  He reports that he continues to have burning and tingling in the lower extremities.  Typically his discomfort is worse at rest.  He states that he has been doing physical therapy.  He has noticed that when he is walking sometimes the left foot will turn out.  He reports rehab has been doing exercises to strengthen his hip.  He returns  today for an evaluation.   REVIEW OF SYSTEMS: Out of a complete 14 system review of symptoms, the patient complains only of the following symptoms, and all other reviewed systems are negative.  Memory loss, numbness, walking difficulty, restless leg, apnea, daytime sleepiness, eye itching  ALLERGIES: No Known Allergies  HOME MEDICATIONS: Outpatient Medications Prior to Visit  Medication Sig Dispense Refill  . acetaminophen (TYLENOL) 325 MG tablet Take 650 mg by mouth every 8 (eight) hours as needed.     Marland Kitchen aspirin EC 81 MG tablet Take 81 mg by mouth daily.     . carvedilol (COREG) 3.125 MG tablet Take 1 tablet by mouth 2 (two) times daily with a meal.     . ergocalciferol (VITAMIN D2) 50000 units capsule Take 50,000 Units by mouth once a week.    Marland Kitchen HUMALOG KWIKPEN 100 UNIT/ML KiwkPen Inject 16 Units into the skin 3 (three) times daily.     . Insulin Glargine (BASAGLAR KWIKPEN) 100 UNIT/ML SOPN Inject 25 Units into the skin at bedtime.    . mycophenolate (CELLCEPT) 250 MG capsule Take 2 capsules by mouth 2 (two) times daily.    . predniSONE (DELTASONE) 5 MG tablet Take 5 mg by mouth daily.     . pregabalin (LYRICA) 50 MG capsule One capsule in the morning and 2 in  the evening 270 capsule 1  . senna-docusate (SENOKOT-S) 8.6-50 MG tablet Take 2 tablets by mouth 2 (two) times daily.     . tacrolimus (PROGRAF) 1 MG capsule Take 2 mg (2  pills) at 9 AM and 1 mg (1 pills) at 9 PM--Z94.0  Dispense Dr. Reece Levy please    . tamsulosin (FLOMAX) 0.4 MG CAPS capsule 0.4 mg daily.     Marland Kitchen amLODipine (NORVASC) 5 MG tablet Take 10 mg by mouth daily.      Facility-Administered Medications Prior to Visit  Medication Dose Route Frequency Provider Last Rate Last Dose  . 0.9 %  sodium chloride infusion  500 mL Intravenous Once Irene Shipper, MD        PAST MEDICAL HISTORY: Past Medical History:  Diagnosis Date  . Abscess in epidural space of cervical spine 08/13/2015  . Acute hepatic encephalopathy   . Acute  renal failure superimposed on stage 3 chronic kidney disease ([place])   . Anemia of chronic disease   . Ascites   . Bleeding esophageal varices ([place])   . Coagulopathy ([place])   . Depression   . Diabetes mellitus type II, controlled ([place])   . Diabetic neuropathy ([place])   . Esophageal stricture   . NASH (nonalcoholic steatohepatitis)   . Protein-calorie malnutrition, severe ([place])   . Secondary biliary cirrhosis ([place])   . Thrombocytopenia ([place])     PAST SURGICAL HISTORY: Past Surgical History:  Procedure Laterality Date  . COLONOSCOPY    . ESOPHAGEAL BANDING Bilateral 11/29/2013   Procedure: ESOPHAGEAL BANDING;  Surgeon: Irene Shipper, MD;  Location: [facility] ENDOSCOPY;  Service: Endoscopy;  Laterality: Bilateral;  . ESOPHAGEAL BANDING N/A 06/13/2014   Procedure: ESOPHAGEAL BANDING;  Surgeon: Irene Shipper, MD;  Location: [facility] ENDOSCOPY;  Service: Endoscopy;  Laterality: N/A;  . ESOPHAGOGASTRODUODENOSCOPY  08/01/2011   Procedure: ESOPHAGOGASTRODUODENOSCOPY (EGD);  Surgeon: Scarlette Shorts, MD;  Location: [facility];  Service: Endoscopy;  Laterality: N/A;  . ESOPHAGOGASTRODUODENOSCOPY N/A 10/27/2012   Procedure: ESOPHAGOGASTRODUODENOSCOPY (EGD);  Surgeon: Inda Castle, MD;  Location: [facility];  Service: Endoscopy;  Laterality: N/A;  . ESOPHAGOGASTRODUODENOSCOPY N/A 11/22/2012   Procedure: ESOPHAGOGASTRODUODENOSCOPY (EGD);  Surgeon: Irene Shipper, MD;  Location: [facility];  Service: Endoscopy;  Laterality: N/A;  . ESOPHAGOGASTRODUODENOSCOPY N/A 11/29/2013   Procedure: ESOPHAGOGASTRODUODENOSCOPY (EGD);  Surgeon: Irene Shipper, MD;  Location: [facility];  Service: Endoscopy;  Laterality: N/A;  . ESOPHAGOGASTRODUODENOSCOPY N/A 06/13/2014   Procedure: ESOPHAGOGASTRODUODENOSCOPY (EGD);  Surgeon: Irene Shipper, MD;  Location: [facility];  Service: Endoscopy;  Laterality: N/A;  . KIDNEY STONE SURGERY  yrs ago  . 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 UPPER GASTROINTESTINAL ENDOSCOPY      FAMILY HISTORY: Family  History  Problem Relation Age of Onset  . Diabetes Father   . Heart disease Father   . Aneurysm Mother   . Heart disease Other        neice  . Diabetes Unknown        niece  . Colon cancer Neg Hx   . Anesthesia problems Neg Hx   . Hypotension Neg Hx   . Malignant hyperthermia Neg Hx   . Pseudochol deficiency Neg Hx     SOCIAL HISTORY: Social History   Socioeconomic History  . Marital status: Married    Spouse name: Not on file  . Number of children: 1  . Years of education: 58  . Highest education level: Not on file  Occupational History  . Occupation: Retired  Social Needs  . Financial resource strain: Not on file  . Food insecurity:    Worry: Not on file    Inability: Not on file  . Transportation needs:    Medical: Not on file    Non-medical: Not on file  Tobacco Use  . Smoking status: Never Smoker  . Smokeless tobacco: Never Used  Substance and Sexual Activity  . Alcohol use: No    Alcohol/week: 0.0 oz    Comment: very rare  . Drug use: No  . Sexual activity: Never  Lifestyle  . Physical activity:    Days per week: Not on file    Minutes per session: Not on file  . Stress: Not on file  Relationships  . Social connections:    Talks on phone: Not on file    Gets together: Not on file    Attends religious service: Not on file    Active member of club or organization: Not on file    Attends meetings of clubs or organizations: Not on file    Relationship status: Not on file  . Intimate partner violence:    Fear of current or ex partner: Not on file    Emotionally abused: Not on file    Physically abused: Not on file    Forced sexual activity: Not on file  Other Topics Concern  . Not on file  Social History Narrative   Lives at home w/ his wife and 2 poodles   Patient drinks caffeine 1-2 times per week.   Patient is right handed.       PHYSICAL EXAM  Vitals:   12/14/17 1014  Height: 5\' 10"  (1.778 m)   Body mass index is 36.45  kg/m.  Generalized: Well developed, in no acute distress   Neurological examination  Mentation: Alert oriented to time, place, history taking. Follows all commands speech and language fluent Cranial nerve II-XII: Pupils were equal round reactive to light. Extraocular movements were full, visual field were full on confrontational test. Facial sensation and strength were normal. Uvula tongue midline. Head turning and shoulder shrug  were normal and symmetric. Motor: The motor testing reveals 5 over 5 strength of all 4 extremities. Good symmetric motor tone is noted throughout.  Sensory: Sensory testing is intact to soft touch on all 4 extremities. No evidence of extinction is noted.  Coordination: Cerebellar testing reveals good finger-nose-finger and heel-to-shin bilaterally.  Gait and station: Gait is slightly unsteady.   Mild limp on the left.  Tandem gait not attempted.  Romberg is negative. Reflexes: Deep tendon reflexes are symmetric and normal bilaterally.   DIAGNOSTIC DATA (LABS, IMAGING, TESTING) - I reviewed patient records, labs, notes, testing and imaging myself where available.  Lab Results  Component Value Date   WBC 2.5 (L) 09/20/2015   HGB 9.4 (L) 09/20/2015   HCT 29.3 (L) 09/20/2015   MCV 89.3 09/20/2015   PLT 28 (LL) 09/20/2015      Component Value Date/Time   NA 138 09/20/2015 0540   K 4.0 09/20/2015 0540   CL 100 (L) 09/20/2015 0540   CO2 28 09/20/2015 0540   GLUCOSE 232 (H) 09/20/2015 0540   BUN 64 (H) 09/20/2015 0540   CREATININE 3.05 (H) 09/20/2015 0540   CREATININE 2.21 (H) 08/13/2015 1530   CALCIUM 8.8 (L) 09/20/2015 0540   PROT 6.7 02/06/2016 1550   ALBUMIN 3.5 09/20/2015 0540   AST 22 09/20/2015 0540   ALT 13 (L) 09/20/2015 0540   ALKPHOS 49  09/20/2015 0540   BILITOT 2.4 (H) 09/20/2015 0540   GFRNONAA 20 (L) 09/20/2015 0540   GFRNONAA 31 (L) 08/13/2015 1530   GFRAA 24 (L) 09/20/2015 0540   GFRAA 36 (L) 08/13/2015 1530    Lab Results  Component  Value Date   HGBA1C 6.6 (H) 09/06/2015   Lab Results  Component Value Date   VITAMINB12 611 02/06/2016   Lab Results  Component Value Date   TSH 1.748 07/25/2015      ASSESSMENT AND PLAN 67 y.o. year old male  has a past medical history of Abscess in epidural space of cervical spine (08/13/2015), Acute hepatic encephalopathy, Acute renal failure superimposed on stage 3 chronic kidney disease ([place]), Anemia of chronic disease, Ascites, Bleeding esophageal varices ([place]), Coagulopathy ([place]), Depression, Diabetes mellitus type II, controlled ([place]), Diabetic neuropathy ([place]), Esophageal stricture, NASH (nonalcoholic steatohepatitis), Protein-calorie malnutrition, severe ([place]), Secondary biliary cirrhosis ([place]), and Thrombocytopenia ([place]). here with:  1.  Diabetic neuropathy 2.  Numbness right hand  The patient will continue on Lyrica.  I will send another order in for his compounded cream.  He is also having numbness in the right hand typically when he wakes up in the morning.  This is suspicious for carpal tunnel syndrome.  The patient would like to proceed with nerve conduction studies with EMG to confirm.  I will order this today.  He is advised that if his symptoms worsen or he develops new symptoms he should let us know.  He will follow-up in 6 months or sooner if needed.     Ward Givens, MSN, NP-C 12/14/2017, 10:20 AM [facility] [address] [place], [place] [address] [phone]

## 2017-12-15 NOTE — Progress Notes (Signed)
Fax confirmation received [facility] [phone], sy

## 2018-01-11 ENCOUNTER — Telehealth: Payer: Self-pay | Admitting: Adult Health

## 2018-01-11 NOTE — Telephone Encounter (Signed)
Pts wife [name]) requesting refills for pregabalin (LYRICA) 50 MG capsule sent to [facility] [phone]

## 2018-01-12 ENCOUNTER — Other Ambulatory Visit: Payer: Self-pay | Admitting: Neurology

## 2018-01-12 MED ORDER — PREGABALIN 50 MG PO CAPS: ORAL_CAPSULE | ORAL | 0 refills | Status: DC

## 2018-01-12 NOTE — Telephone Encounter (Signed)
Sent to the fax number provided by the patient. Received confirmation.

## 2018-01-27 ENCOUNTER — Ambulatory Visit (INDEPENDENT_AMBULATORY_CARE_PROVIDER_SITE_OTHER): Payer: Medicare Other | Admitting: Neurology

## 2018-01-27 ENCOUNTER — Encounter: Payer: Self-pay | Admitting: Neurology

## 2018-01-27 DIAGNOSIS — R200 Anesthesia of skin: Secondary | ICD-10-CM

## 2018-01-27 DIAGNOSIS — G5601 Carpal tunnel syndrome, right upper limb: Secondary | ICD-10-CM | POA: Diagnosis not present

## 2018-01-27 DIAGNOSIS — G5603 Carpal tunnel syndrome, bilateral upper limbs: Secondary | ICD-10-CM

## 2018-01-27 HISTORY — DX: Carpal tunnel syndrome, bilateral upper limbs: G56.03

## 2018-01-27 NOTE — Progress Notes (Addendum)
EMG nerve conduction study was done today, evidence of bilateral carpal tunnel syndrome as noted.  The patient was given a wrist splint on the right to try for at least 2 months, he will call if he is not getting better.     [place]    Nerve / Sites Muscle Latency Ref. Amplitude Ref. Rel Amp Segments Distance Velocity Ref. Area    ms ms mV mV %  cm m/s m/s mVms  R Median - APB     Wrist APB 5.2 ?4.4 3.7 ?4.0 100 Wrist - APB 7   14.1     Upper arm APB 10.1  3.8  102 Upper arm - Wrist 24 49 ?49 14.7  L Median - APB     Wrist APB 5.2 ?4.4 3.0 ?4.0 100 Wrist - APB 7   10.3     Upper arm APB 10.1  2.9  98.4 Upper arm - Wrist 24 49 ?49 11.1  R Ulnar - ADM     Wrist ADM 3.1 ?3.3 8.7 ?6.0 100 Wrist - ADM 7   26.0     B.Elbow ADM 7.7  8.5  97.7 B.Elbow - Wrist 22 49 ?49 24.3     A.Elbow ADM 9.7  8.2  96.8 A.Elbow - B.Elbow 10 49 ?49 24.1         A.Elbow - Wrist      L Ulnar - ADM     Wrist ADM 3.1 ?3.3 9.1 ?6.0 100 Wrist - ADM 7   23.3     B.Elbow ADM 7.4  7.1  77.4 B.Elbow - Wrist 22 52 ?49 22.6     A.Elbow ADM 9.2  7.2  101 A.Elbow - B.Elbow 10 55 ?49 24.0         A.Elbow - Wrist                 SNC    Nerve / Sites Rec. Site Peak Lat Ref.  Amp Ref. Segments Distance    ms ms V V  cm  R Median - Orthodromic (Dig II, Mid palm)     Dig II Wrist 4.0 ?3.4 5 ?10 Dig II - Wrist 13  L Median - Orthodromic (Dig II, Mid palm)     Dig II Wrist 4.2 ?3.4 13 ?10 Dig II - Wrist 13  R Ulnar - Orthodromic, (Dig V, Mid palm)     Dig V Wrist 2.9 ?3.1 6 ?5 Dig V - Wrist 11  L Ulnar - Orthodromic, (Dig V, Mid palm)     Dig V Wrist 3.0 ?3.1 8 ?5 Dig V - Wrist 47              F  Wave    Nerve F Lat Ref.   ms ms  R Ulnar - ADM 31.8 ?32.0  L Ulnar - ADM 31.9 ?32.0

## 2018-01-27 NOTE — Progress Notes (Signed)
Please refer to EMG and nerve conduction study procedure note. 

## 2018-01-27 NOTE — Procedures (Signed)
     HISTORY:  [name] is a 67 year old gentleman with a history of a diabetic neuropathy who is now complaining of some numbness in the right hand and some achy pain into the right forearm.  He is being evaluated for possible neuropathy or a cervical radiculopathy.  NERVE CONDUCTION STUDIES:  Nerve conduction studies were performed on both upper extremities.  The distal motor latencies for the median nerves were prolonged bilaterally with slightly low motor amplitudes seen for these nerves bilaterally.  The nerve conduction velocities for the median nerves were normal bilaterally.  The distal motor latencies and motor amplitudes for the ulnar nerves were normal bilaterally with normal nerve conduction velocities seen for these nerves.  The sensory latencies for the median nerves were prolonged bilaterally, normal for the ulnar nerves bilaterally.  The F-wave latencies for the ulnar nerves were normal bilaterally.  EMG STUDIES:  EMG study was performed on the right upper extremity:  The first dorsal interosseous muscle reveals 2 to 4 K units with full recruitment. No fibrillations or positive waves were noted. The abductor pollicis brevis muscle reveals 2 to 4 K units with decreased recruitment. No fibrillations or positive waves were noted. The extensor indicis proprius muscle reveals 1 to 3 K units with full recruitment. No fibrillations or positive waves were noted. The pronator teres muscle reveals 2 to 3 K units with full recruitment. No fibrillations or positive waves were noted. The biceps muscle reveals 1 to 2 K units with full recruitment. No fibrillations or positive waves were noted. The triceps muscle reveals 2 to 4 K units with full recruitment. No fibrillations or positive waves were noted. The anterior deltoid muscle reveals 2 to 3 K units with full recruitment. No fibrillations or positive waves were noted. The cervical paraspinal muscles were tested at 2 levels. No  abnormalities of insertional activity were seen at either level tested. There was good relaxation.   IMPRESSION:  Nerve conduction studies done on both upper extremities shows evidence of bilateral carpal tunnel syndrome of moderate severity.  No other significant abnormalities were seen.  EMG evaluation of the right upper extremity does not show evidence of an overlying cervical radiculopathy.  Jill Alexanders MD 01/27/2018 3:26 PM  [facility] [address] [place], [place] [address]  Phone [phone] Fax [phone]

## 2018-01-28 ENCOUNTER — Telehealth: Payer: Self-pay | Admitting: *Deleted

## 2018-01-28 NOTE — Telephone Encounter (Signed)
Spoke with patient and informed him his EMG showed bilateral carpal tunnel syndrome. He  can consider wearing wrist splints or the NP  can place a referral to a hand surgeon. He stated he has a splint, and his right hand is the worst. He asked if he needed to wear a splint on both hands. This RN advised he may depending on his discomfort, that is up to him. Advised he call back if he decided he would like a referral. He verbalized understanding, appreciation of call.

## 2018-03-01 ENCOUNTER — Telehealth: Payer: Self-pay | Admitting: *Deleted

## 2018-03-01 NOTE — Telephone Encounter (Signed)
Spoke to [name] at [employer] for pt assistance needing prescription.  Last prescription done for 3 mo 01-12-18.  She is to call me back tomorrow.

## 2018-03-04 NOTE — Telephone Encounter (Signed)
Do you need something from me?

## 2018-03-04 NOTE — Telephone Encounter (Signed)
Nothing needs to be done at this time.

## 2018-03-04 NOTE — Telephone Encounter (Signed)
Drug Registry last fill #[id number] [phone]) 12-29-17.  I called and spoke to Riva with [employer].  They do that the order ID# [id number] for pts next delivery. (stated pending right now but will be to be processed and filled 9/1).  He recommended to call and check on 03-08-18 to see filled.

## 2018-03-17 ENCOUNTER — Telehealth: Payer: Self-pay | Admitting: Adult Health

## 2018-03-17 MED ORDER — PREGABALIN 50 MG PO CAPS: ORAL_CAPSULE | ORAL | 0 refills | Status: DC

## 2018-03-17 NOTE — Telephone Encounter (Signed)
Generic Lyrica escribed to [facility]. Spoke with wife [name] and let her know this has been done/fim

## 2018-03-17 NOTE — Telephone Encounter (Signed)
Pts wife(Martha) called requesting the generic for pregabalin (LYRICA) 50 MG capsule be called into [facility]. Stating they are able to get this medication a lot cheaper. Please advise

## 2018-03-18 ENCOUNTER — Telehealth: Payer: Self-pay | Admitting: *Deleted

## 2018-03-18 NOTE — Telephone Encounter (Signed)
CMM key: [id number]. Approved: Case [id number], Start Date:02/16/2018; End Date:03/18/2019.

## 2018-04-14 ENCOUNTER — Telehealth: Payer: Self-pay | Admitting: Neurology

## 2018-04-14 NOTE — Telephone Encounter (Signed)
[facility]  Rx sent [facility] Fax [phone] .

## 2018-07-12 ENCOUNTER — Encounter: Payer: Self-pay | Admitting: Adult Health

## 2018-07-12 ENCOUNTER — Ambulatory Visit (INDEPENDENT_AMBULATORY_CARE_PROVIDER_SITE_OTHER): Payer: Medicare Other | Admitting: Adult Health

## 2018-07-12 VITALS — BP 133/76 | HR 72 | Ht 70.0 in | Wt 255.0 lb

## 2018-07-12 DIAGNOSIS — G5603 Carpal tunnel syndrome, bilateral upper limbs: Secondary | ICD-10-CM | POA: Diagnosis not present

## 2018-07-12 DIAGNOSIS — E1142 Type 2 diabetes mellitus with diabetic polyneuropathy: Secondary | ICD-10-CM

## 2018-07-12 DIAGNOSIS — G0491 Myelitis, unspecified: Secondary | ICD-10-CM

## 2018-07-12 NOTE — Patient Instructions (Signed)
Your Plan:  Continue Lyrica- consider increasing if pain worsens Use compound cream when sitting If your symptoms worsen or you develop new symptoms please let us know.    Thank you for coming to see [place] at [facility]. I hope we have been able to provide you high quality care today.  You may receive a patient satisfaction survey over the next few weeks. We would appreciate your feedback and comments so that we may continue to improve ourselves and the health of our patients.

## 2018-07-12 NOTE — Progress Notes (Signed)
I have read the note, and I agree with the clinical assessment and plan.  Charles K Willis   

## 2018-07-12 NOTE — Progress Notes (Signed)
PATIENT: [name] DOB: 08/23/1950  REASON FOR VISIT: follow up HISTORY FROM: patient  HISTORY OF PRESENT ILLNESS: Today 07/12/18:  [name] is a 68 year old male with a history of cervical myelitis and diabetic peripheral neuropathy.  He returns today for follow-up.  Overall he feels that he is doing well.  He still has some burning and tingling pain primarily from the knees down.  This is worse when he is sitting or when he is extremely active during the day.  He is found that if he uses a compound cream and does alleviate some of his pain.  He continues to take 50 mg of Lyrica in the morning and 100 mg at bedtime.  He denies any significant changes with his gait or balance.  Denies any falls.  If he plans to be walking a long distance he will use a cane.  He states that sometimes when he wakes up in the morning his left knee will give way.  He has not had any fall.  He has not followed with his primary care provider but does state that he has an appointment coming up at [facility] for carpal tunnel syndrome and plans to discuss his knee issues with him.  He returns today for follow-up.  HISTORY 12/14/17 [name] is a 68 year old male with a history of cervical myelitis and diabetic peripheral neuropathy.  He returns today for follow-up.  He continues on Lyrica 50 mg in the morning and 100 mg in the evening.  He reports that this works well for him.  He states that he typically gets discomfort in the evenings when he sits down.  He states that he has a burning tingling pain that radiates from the feet up to the knees bilaterally.  He denies any changes with his gait or balance.  Denies any recent falls.  In the past he has used a compounded cream with good benefit.  He has not had this refilled.  He does have sleep apnea and this is followed at [facility].  The patient reports that he has been having numbness in the right hand.  This typically occurs when he wakes up in the morning.  He has noticed  that throughout the day if his hand is been the same position for a long period of time. He returns today for evaluation.   REVIEW OF SYSTEMS: Out of a complete 14 system review of symptoms, the patient complains only of the following symptoms, and all other reviewed systems are negative.  See HPI  ALLERGIES: Not on File  HOME MEDICATIONS: Outpatient Medications Prior to Visit  Medication Sig Dispense Refill  . acetaminophen (TYLENOL) 325 MG tablet Take 650 mg by mouth every 8 (eight) hours as needed.     Marland Kitchen amLODipine (NORVASC) 10 MG tablet Take 10 mg by mouth at bedtime.    Marland Kitchen aspirin EC 81 MG tablet Take 81 mg by mouth daily.     . carvedilol (COREG) 3.125 MG tablet Take 1 tablet by mouth 2 (two) times daily with a meal.     . ergocalciferol (VITAMIN D2) 50000 units capsule Take 50,000 Units by mouth once a week.    Marland Kitchen HUMALOG KWIKPEN 100 UNIT/ML KiwkPen Inject 16 Units into the skin 3 (three) times daily.     . Insulin Glargine (BASAGLAR KWIKPEN) 100 UNIT/ML SOPN Inject 25 Units into the skin at bedtime.    . mycophenolate (CELLCEPT) 250 MG capsule Take 2 capsules by mouth 2 (two) times  daily.    . predniSONE (DELTASONE) 5 MG tablet Take 5 mg by mouth daily.     . pregabalin (LYRICA) 50 MG capsule Take one capsule in the morning and 2 capsules in the evening. 270 capsule 0  . senna-docusate (SENOKOT-S) 8.6-50 MG tablet Take 2 tablets by mouth 2 (two) times daily.     . tacrolimus (PROGRAF) 1 MG capsule Take 2 mg (2  pills) at 9 AM and 1 mg (1 pills) at 9 PM--Z94.0  Dispense Dr. Reece Levy please    . tamsulosin (FLOMAX) 0.4 MG CAPS capsule 0.4 mg daily.     Marland Kitchen amLODipine (NORVASC) 5 MG tablet Take 10 mg by mouth daily.      Facility-Administered Medications Prior to Visit  Medication Dose Route Frequency Provider Last Rate Last Dose  . 0.9 %  sodium chloride infusion  500 mL Intravenous Once Irene Shipper, MD        PAST MEDICAL HISTORY: Past Medical History:  Diagnosis Date  .  Abscess in epidural space of cervical spine 08/13/2015  . Acute hepatic encephalopathy   . Acute renal failure superimposed on stage 3 chronic kidney disease ([place])   . Anemia of chronic disease   . Ascites   . Bilateral carpal tunnel syndrome 01/27/2018  . Bleeding esophageal varices ([place])   . Coagulopathy ([place])   . Depression   . Diabetes mellitus type II, controlled ([place])   . Diabetic neuropathy ([place])   . Esophageal stricture   . NASH (nonalcoholic steatohepatitis)   . Protein-calorie malnutrition, severe ([place])   . Secondary biliary cirrhosis ([place])   . Thrombocytopenia ([place])     PAST SURGICAL HISTORY: Past Surgical History:  Procedure Laterality Date  . COLONOSCOPY    . ESOPHAGEAL BANDING Bilateral 11/29/2013   Procedure: ESOPHAGEAL BANDING;  Surgeon: Irene Shipper, MD;  Location: [facility] ENDOSCOPY;  Service: Endoscopy;  Laterality: Bilateral;  . ESOPHAGEAL BANDING N/A 06/13/2014   Procedure: ESOPHAGEAL BANDING;  Surgeon: Irene Shipper, MD;  Location: [facility] ENDOSCOPY;  Service: Endoscopy;  Laterality: N/A;  . ESOPHAGOGASTRODUODENOSCOPY  08/01/2011   Procedure: ESOPHAGOGASTRODUODENOSCOPY (EGD);  Surgeon: Scarlette Shorts, MD;  Location: [facility];  Service: Endoscopy;  Laterality: N/A;  . ESOPHAGOGASTRODUODENOSCOPY N/A 10/27/2012   Procedure: ESOPHAGOGASTRODUODENOSCOPY (EGD);  Surgeon: Inda Castle, MD;  Location: [facility];  Service: Endoscopy;  Laterality: N/A;  . ESOPHAGOGASTRODUODENOSCOPY N/A 11/22/2012   Procedure: ESOPHAGOGASTRODUODENOSCOPY (EGD);  Surgeon: Irene Shipper, MD;  Location: [facility];  Service: Endoscopy;  Laterality: N/A;  . ESOPHAGOGASTRODUODENOSCOPY N/A 11/29/2013   Procedure: ESOPHAGOGASTRODUODENOSCOPY (EGD);  Surgeon: Irene Shipper, MD;  Location: [facility];  Service: Endoscopy;  Laterality: N/A;  . ESOPHAGOGASTRODUODENOSCOPY N/A 06/13/2014   Procedure: ESOPHAGOGASTRODUODENOSCOPY (EGD);  Surgeon: Irene Shipper, MD;  Location: [facility];  Service: Endoscopy;   Laterality: N/A;  . KIDNEY STONE SURGERY  yrs ago  . 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 UPPER GASTROINTESTINAL ENDOSCOPY      FAMILY HISTORY: Family History  Problem Relation Age of Onset  . Diabetes Father   . Heart disease Father   . Aneurysm Mother   . Heart disease Other        neice  . Diabetes Unknown        niece  . Colon cancer Neg Hx   . Anesthesia problems Neg Hx   . Hypotension Neg Hx   . Malignant hyperthermia Neg Hx   . Pseudochol deficiency Neg Hx     SOCIAL HISTORY: Social History  Socioeconomic History  . Marital status: Married    Spouse name: Not on file  . Number of children: 1  . Years of education: 73  . Highest education level: Not on file  Occupational History  . Occupation: Retired  Scientific laboratory technician  . Financial resource strain: Not on file  . Food insecurity:    Worry: Not on file    Inability: Not on file  . Transportation needs:    Medical: Not on file    Non-medical: Not on file  Tobacco Use  . Smoking status: Never Smoker  . Smokeless tobacco: Never Used  Substance and Sexual Activity  . Alcohol use: No    Alcohol/week: 0.0 standard drinks    Comment: very rare  . Drug use: No  . Sexual activity: Never  Lifestyle  . Physical activity:    Days per week: Not on file    Minutes per session: Not on file  . Stress: Not on file  Relationships  . Social connections:    Talks on phone: Not on file    Gets together: Not on file    Attends religious service: Not on file    Active member of club or organization: Not on file    Attends meetings of clubs or organizations: Not on file    Relationship status: Not on file  . Intimate partner violence:    Fear of current or ex partner: Not on file    Emotionally abused: Not on file    Physically abused: Not on file    Forced sexual activity: Not on file  Other Topics Concern  . Not on file  Social History Narrative   Lives at home w/ his wife and 2 poodles   Patient drinks caffeine 1-2 times  per week.   Patient is right handed.       PHYSICAL EXAM  Vitals:   07/12/18 1118  BP: 133/76  Pulse: 72  Weight: 255 lb (115.7 kg)  Height: 5\' 10"  (1.778 m)   Body mass index is 36.59 kg/m.  Generalized: Well developed, in no acute distress   Neurological examination  Mentation: Alert oriented to time, place, history taking. Follows all commands speech and language fluent Cranial nerve II-XII: Pupils were equal round reactive to light. Extraocular movements were full, visual field were full on confrontational test. Facial sensation and strength were normal. Uvula tongue midline. Head turning and shoulder shrug  were normal and symmetric. Motor: The motor testing reveals 5 over 5 strength of all 4 extremities. Good symmetric motor tone is noted throughout.  Sensory: Sensory testing is intact to soft touch on all 4 extremities. No evidence of extinction is noted.  Coordination: Cerebellar testing reveals good finger-nose-finger and heel-to-shin bilaterally.  Gait and station: Patient has a mild limp on the left.  He states that this is due to the left hip and knee.  Tandem gait is slightly unsteady.  Romberg is negative. No drift is seen.  Reflexes: Deep tendon reflexes are symmetric and normal bilaterally.   DIAGNOSTIC DATA (LABS, IMAGING, TESTING) - I reviewed patient records, labs, notes, testing and imaging myself where available.  Lab Results  Component Value Date   WBC 2.5 (L) 09/20/2015   HGB 9.4 (L) 09/20/2015   HCT 29.3 (L) 09/20/2015   MCV 89.3 09/20/2015   PLT 28 (LL) 09/20/2015      Component Value Date/Time   NA 138 09/20/2015 0540   K 4.0 09/20/2015 0540   CL 100 (L)  09/20/2015 0540   CO2 28 09/20/2015 0540   GLUCOSE 232 (H) 09/20/2015 0540   BUN 64 (H) 09/20/2015 0540   CREATININE 3.05 (H) 09/20/2015 0540   CREATININE 2.21 (H) 08/13/2015 1530   CALCIUM 8.8 (L) 09/20/2015 0540   PROT 6.7 02/06/2016 1550   ALBUMIN 3.5 09/20/2015 0540   AST 22  09/20/2015 0540   ALT 13 (L) 09/20/2015 0540   ALKPHOS 49 09/20/2015 0540   BILITOT 2.4 (H) 09/20/2015 0540   GFRNONAA 20 (L) 09/20/2015 0540   GFRNONAA 31 (L) 08/13/2015 1530   GFRAA 24 (L) 09/20/2015 0540   GFRAA 36 (L) 08/13/2015 1530    Lab Results  Component Value Date   HGBA1C 6.6 (H) 09/06/2015   Lab Results  Component Value Date   VITAMINB12 611 02/06/2016   Lab Results  Component Value Date   TSH 1.748 07/25/2015      ASSESSMENT AND PLAN 68 y.o. year old male  has a past medical history of Abscess in epidural space of cervical spine (08/13/2015), Acute hepatic encephalopathy, Acute renal failure superimposed on stage 3 chronic kidney disease (H[facility]), Anemia of chronic disease, Ascites, Bilateral carpal tunnel syndrome (01/27/2018), Bleeding esophageal varices ([place]), Coagulopathy ([place]), Depression, Diabetes mellitus type II, controlled ([place]), Diabetic neuropathy ([place]), Esophageal stricture, NASH (nonalcoholic steatohepatitis), Protein-calorie malnutrition, severe ([place]), Secondary biliary cirrhosis ([place]), and Thrombocytopenia ([place]). here with:  1.  Diabetic peripheral neuropathy 2.  Cervical myelitis  The patient will continue on Lyrica 50 mg in the morning and 100 mg in the evening.  I have advised that we can increase Lyrica and add a lunchtime dose as needed.  For now the patient plans to use a compounded cream more often to see if this alleviates his discomfort.  He is advised that if his symptoms worsen or he develops new symptoms he should let us know.  He will follow-up in 6 months or sooner if needed.   Ward Givens, MSN, NP-C 07/12/2018, 11:36 AM [facility] [address], [place], [place] [address] [phone]

## 2018-11-11 ENCOUNTER — Other Ambulatory Visit: Payer: Self-pay | Admitting: Adult Health

## 2018-11-15 NOTE — Telephone Encounter (Signed)
Drug registry checked last fill 03-26-19 for #[id number], Lyrica 50mg  po 1 in am and 2 in pm.  F/u 01-13-19 with Dr. Jannifer Franklin.

## 2019-01-13 ENCOUNTER — Ambulatory Visit: Payer: Medicare Other | Admitting: Neurology

## 2019-01-24 ENCOUNTER — Other Ambulatory Visit: Payer: Self-pay

## 2019-01-24 MED ORDER — PREGABALIN 50 MG PO CAPS: ORAL_CAPSULE | ORAL | 1 refills | Status: DC

## 2019-01-25 NOTE — Progress Notes (Signed)
PATIENT: [name] DOB: [birth year]  REASON FOR VISIT: follow up HISTORY FROM: patient  HISTORY OF PRESENT ILLNESS: Today 01/26/19 Mr. Boudlin is a 68 year old male with history of cervical myelitis and diabetic peripheral neuropathy.  He returns today for follow-up accompanied by his wife.  He reports he had a kidney and liver transplant in 2017, after that he developed paresthesias in his lower extremities.  Since then, he has been taking Lyrica.  He is currently taking Lyrica 50 mg in the morning, 100 mg at bedtime.  Since last visit, he had carpal tunnel release on the right side.  He remains on to transplant rejection drugs, will be stopping Prograf tomorrow and starting something else.  He continues to follow with [facility] transplant.  He is a diabetic, checks his sugars regularly.  He says on average his sugars run around 160.  He thinks his last A1c was 7.1.  He has not had any falls.  He says his pain is most bothersome at the end of the day.  When he wakes up in the morning he feels best.  He reports he has had chronic swelling, mostly in the right leg.  HISTORY 07/12/2018 MM: Mr. Woolum is a 68 year old male with a history of cervical myelitis and diabetic peripheral neuropathy.  He returns today for follow-up.  Overall he feels that he is doing well.  He still has some burning and tingling pain primarily from the knees down.  This is worse when he is sitting or when he is extremely active during the day.  He is found that if he uses a compound cream and does alleviate some of his pain.  He continues to take 50 mg of Lyrica in the morning and 100 mg at bedtime.  He denies any significant changes with his gait or balance.  Denies any falls.  If he plans to be walking a long distance he will use a cane.  He states that sometimes when he wakes up in the morning his left knee will give way.  He has not had any fall.  He has not followed with his primary care provider but does state that he has  an appointment coming up at [facility] for carpal tunnel syndrome and plans to discuss his knee issues with him.  He returns today for follow-up.  REVIEW OF SYSTEMS: Out of a complete 14 system review of symptoms, the patient complains only of the following symptoms, and all other reviewed systems are negative.  Leg swelling, burning, pain in his feet  ALLERGIES: Not on File  HOME MEDICATIONS: Outpatient Medications Prior to Visit  Medication Sig Dispense Refill  . acetaminophen (TYLENOL) 325 MG tablet Take 650 mg by mouth every 8 (eight) hours as needed.     Marland Kitchen amLODipine (NORVASC) 10 MG tablet Take 10 mg by mouth at bedtime.    Marland Kitchen aspirin EC 81 MG tablet Take 81 mg by mouth daily.     . carvedilol (COREG) 3.125 MG tablet Take 1 tablet by mouth 2 (two) times daily with a meal.     . ergocalciferol (VITAMIN D2) 50000 units capsule Take 50,000 Units by mouth once a week.    Marland Kitchen HUMALOG KWIKPEN 100 UNIT/ML KiwkPen Inject 16 Units into the skin 3 (three) times daily.     . Insulin Glargine (BASAGLAR KWIKPEN) 100 UNIT/ML SOPN Inject 25 Units into the skin at bedtime.    . mycophenolate (CELLCEPT) 250 MG capsule Take 2 capsules by mouth 2 (  two) times daily.    . predniSONE (DELTASONE) 5 MG tablet Take 5 mg by mouth daily.     Marland Kitchen senna-docusate (SENOKOT-S) 8.6-50 MG tablet Take 2 tablets by mouth 2 (two) times daily.     . Tacrolimus (ENVARSUS XR) 1 MG TB24 Take 1 tablet by mouth 2 (two) times a day. 1 in the morning and 1 at night    . tamsulosin (FLOMAX) 0.4 MG CAPS capsule 0.4 mg daily.     . pregabalin (LYRICA) 50 MG capsule TAKE 1 CAPSULE BY MOUTH IN THE MORNING AND 2 IN THE EVENING 270 capsule 1  . tacrolimus (PROGRAF) 1 MG capsule Take 2 mg (2  pills) at 9 AM and 1 mg (1 pills) at 9 PM--Z94.0  Dispense Dr. Reece Levy please     Facility-Administered Medications Prior to Visit  Medication Dose Route Frequency Provider Last Rate Last Dose  . 0.9 %  sodium chloride infusion  500 mL Intravenous Once Irene Shipper, MD        PAST MEDICAL HISTORY: Past Medical History:  Diagnosis Date  . Abscess in epidural space of cervical spine 08/13/2015  . Acute hepatic encephalopathy   . Acute renal failure superimposed on stage 3 chronic kidney disease ([place])   . Anemia of chronic disease   . Ascites   . Bilateral carpal tunnel syndrome 01/27/2018  . Bleeding esophageal varices ([place])   . Coagulopathy ([place])   . Depression   . Diabetes mellitus type II, controlled ([place])   . Diabetic neuropathy ([place])   . Esophageal stricture   . NASH (nonalcoholic steatohepatitis)   . Protein-calorie malnutrition, severe ([place])   . Secondary biliary cirrhosis ([place])   . Thrombocytopenia ([place])     PAST SURGICAL HISTORY: Past Surgical History:  Procedure Laterality Date  . COLONOSCOPY    . ESOPHAGEAL BANDING Bilateral 11/29/2013   Procedure: ESOPHAGEAL BANDING;  Surgeon: Irene Shipper, MD;  Location: [facility] ENDOSCOPY;  Service: Endoscopy;  Laterality: Bilateral;  . ESOPHAGEAL BANDING N/A 06/13/2014   Procedure: ESOPHAGEAL BANDING;  Surgeon: Irene Shipper, MD;  Location: [facility] ENDOSCOPY;  Service: Endoscopy;  Laterality: N/A;  . ESOPHAGOGASTRODUODENOSCOPY  08/01/2011   Procedure: ESOPHAGOGASTRODUODENOSCOPY (EGD);  Surgeon: Scarlette Shorts, MD;  Location: [facility];  Service: Endoscopy;  Laterality: N/A;  . ESOPHAGOGASTRODUODENOSCOPY N/A 10/27/2012   Procedure: ESOPHAGOGASTRODUODENOSCOPY (EGD);  Surgeon: Inda Castle, MD;  Location: [facility];  Service: Endoscopy;  Laterality: N/A;  . ESOPHAGOGASTRODUODENOSCOPY N/A 11/22/2012   Procedure: ESOPHAGOGASTRODUODENOSCOPY (EGD);  Surgeon: Irene Shipper, MD;  Location: [facility];  Service: Endoscopy;  Laterality: N/A;  . ESOPHAGOGASTRODUODENOSCOPY N/A 11/29/2013   Procedure: ESOPHAGOGASTRODUODENOSCOPY (EGD);  Surgeon: Irene Shipper, MD;  Location: [facility];  Service: Endoscopy;  Laterality: N/A;  . ESOPHAGOGASTRODUODENOSCOPY N/A 06/13/2014   Procedure: ESOPHAGOGASTRODUODENOSCOPY  (EGD);  Surgeon: Irene Shipper, MD;  Location: [facility];  Service: Endoscopy;  Laterality: N/A;  . KIDNEY STONE SURGERY  yrs ago  . 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 UPPER GASTROINTESTINAL ENDOSCOPY      FAMILY HISTORY: Family History  Problem Relation Age of Onset  . Diabetes Father   . Heart disease Father   . Aneurysm Mother   . Heart disease Other        neice  . Diabetes Unknown        niece  . Colon cancer Neg Hx   . Anesthesia problems Neg Hx   . Hypotension Neg Hx   . Malignant hyperthermia Neg Hx   .  Pseudochol deficiency Neg Hx     SOCIAL HISTORY: Social History   Socioeconomic History  . Marital status: Married    Spouse name: Not on file  . Number of children: 1  . Years of education: 37  . Highest education level: Not on file  Occupational History  . Occupation: Retired  Scientific laboratory technician  . Financial resource strain: Not on file  . Food insecurity    Worry: Not on file    Inability: Not on file  . Transportation needs    Medical: Not on file    Non-medical: Not on file  Tobacco Use  . Smoking status: Never Smoker  . Smokeless tobacco: Never Used  Substance and Sexual Activity  . Alcohol use: No    Alcohol/week: 0.0 standard drinks    Comment: very rare  . Drug use: No  . Sexual activity: Never  Lifestyle  . Physical activity    Days per week: Not on file    Minutes per session: Not on file  . Stress: Not on file  Relationships  . Social Herbalist on phone: Not on file    Gets together: Not on file    Attends religious service: Not on file    Active member of club or organization: Not on file    Attends meetings of clubs or organizations: Not on file    Relationship status: Not on file  . Intimate partner violence    Fear of current or ex partner: Not on file    Emotionally abused: Not on file    Physically abused: Not on file    Forced sexual activity: Not on file  Other Topics Concern  . Not on file  Social History Narrative    Lives at home w/ his wife and 2 poodles   Patient drinks caffeine 1-2 times per week.   Patient is right handed.     PHYSICAL EXAM  Vitals:   01/26/19 1438  BP: 121/79  Pulse: 69  Temp: (!) 96.8 F (36 C)  Weight: 252 lb 3.2 oz (114.4 kg)  Height: 5\' 10"  (1.778 m)   Body mass index is 36.19 kg/m.  Generalized: Well developed, in no acute distress   Neurological examination  Mentation: Alert oriented to time, place, history taking. Follows all commands speech and language fluent Cranial nerve II-XII: Pupils were equal round reactive to light. Extraocular movements were full, visual field were full on confrontational test. Facial sensation and strength were normal. Head turning and shoulder shrug  were normal and symmetric. Motor: The motor testing reveals 5 over 5 strength of all 4 extremities. Good symmetric motor tone is noted throughout.  Sensory: Decreased light touch, pinprick, vibration in the lower extremities, loss is more significant on the right up to mid shin no evidence of extinction is noted.  Mild swelling noted to right lower extremity Coordination: Cerebellar testing reveals good finger-nose-finger and heel-to-shin bilaterally.  Gait and station: Gait is slow, cautious Reflexes: Deep tendon reflexes are symmetric and normal bilaterally.   DIAGNOSTIC DATA (LABS, IMAGING, TESTING) - I reviewed patient records, labs, notes, testing and imaging myself where available.  Lab Results  Component Value Date   WBC 2.5 (L) 09/20/2015   HGB 9.4 (L) 09/20/2015   HCT 29.3 (L) 09/20/2015   MCV 89.3 09/20/2015   PLT 28 (LL) 09/20/2015      Component Value Date/Time   NA 138 09/20/2015 0540   K 4.0 09/20/2015 0540   CL  100 (L) 09/20/2015 0540   CO2 28 09/20/2015 0540   GLUCOSE 232 (H) 09/20/2015 0540   BUN 64 (H) 09/20/2015 0540   CREATININE 3.05 (H) 09/20/2015 0540   CREATININE 2.21 (H) 08/13/2015 1530   CALCIUM 8.8 (L) 09/20/2015 0540   PROT 6.7 02/06/2016 1550    ALBUMIN 3.5 09/20/2015 0540   AST 22 09/20/2015 0540   ALT 13 (L) 09/20/2015 0540   ALKPHOS 49 09/20/2015 0540   BILITOT 2.4 (H) 09/20/2015 0540   GFRNONAA 20 (L) 09/20/2015 0540   GFRNONAA 31 (L) 08/13/2015 1530   GFRAA 24 (L) 09/20/2015 0540   GFRAA 36 (L) 08/13/2015 1530   Lab Results  Component Value Date   CHOL  05/13/2008    106        ATP III CLASSIFICATION:  <200     mg/dL   Desirable  200-239  mg/dL   Borderline High  >=240    mg/dL   High   HDL 25 (L) 05/13/2008   LDLCALC  05/13/2008    66        Total Cholesterol/HDL:CHD Risk Coronary Heart Disease Risk Table                     Men   Women  1/2 Average Risk   3.4   3.3   TRIG 73 05/13/2008   CHOLHDL 4.2 05/13/2008   Lab Results  Component Value Date   HGBA1C 6.6 (H) 09/06/2015   Lab Results  Component Value Date   VITAMINB12 611 02/06/2016   Lab Results  Component Value Date   TSH 1.748 07/25/2015    ASSESSMENT AND PLAN 68 y.o. year old male  has a past medical history of Abscess in epidural space of cervical spine (08/13/2015), Acute hepatic encephalopathy, Acute renal failure superimposed on stage 3 chronic kidney disease (HCC), Anemia of chronic disease, Ascites, Bilateral carpal tunnel syndrome (01/27/2018), Bleeding esophageal varices ([place]), Coagulopathy ([place]), Depression, Diabetes mellitus type II, controlled ([place]), Diabetic neuropathy ([place]), Esophageal stricture, NASH (nonalcoholic steatohepatitis), Protein-calorie malnutrition, severe ([place]), Secondary biliary cirrhosis ([place]), and Thrombocytopenia ([place]). here with:  1.  Peripheral neuropathy 2.  Cervical myelitis  His condition appears stable. He continues to complain of pain, burning in his feet and legs. I will increase his Lyrica taking 50 mg in the morning, 50 mg midday, continue 100 mg at bedtime.  I will provide a new prescription.  He will be going on a new antirejection medication tomorrow as result of his liver and kidney transplant in 2017.  He  does complain of some chronic left knee and hip pain.  I have encouraged him to discuss this with his primary care doctor. He continues follow-up with [facility] transplant services.  He will follow-up in 6 months or sooner if needed.  I advised him that if his symptoms worsen or if he develops any new symptoms he should let us know.   I spent 15 minutes with the patient. 50% of this time was spent discussing his plan of care.   Butler Denmark, AGNP-C, DNP 01/26/2019, 3:44 PM [facility] [address], [place], [place] [address] [phone]

## 2019-01-26 ENCOUNTER — Other Ambulatory Visit: Payer: Self-pay

## 2019-01-26 ENCOUNTER — Ambulatory Visit (INDEPENDENT_AMBULATORY_CARE_PROVIDER_SITE_OTHER): Payer: Medicare Other | Admitting: Neurology

## 2019-01-26 ENCOUNTER — Encounter: Payer: Self-pay | Admitting: Neurology

## 2019-01-26 VITALS — BP 121/79 | HR 69 | Temp 96.8°F | Ht 70.0 in | Wt 252.2 lb

## 2019-01-26 DIAGNOSIS — E1142 Type 2 diabetes mellitus with diabetic polyneuropathy: Secondary | ICD-10-CM | POA: Diagnosis not present

## 2019-01-26 MED ORDER — PREGABALIN 50 MG PO CAPS: ORAL_CAPSULE | ORAL | 1 refills | Status: DC

## 2019-01-26 NOTE — Progress Notes (Signed)
I have read the note, and I agree with the clinical assessment and plan.  Alvie Fowles K Meiko Stranahan   

## 2019-02-01 ENCOUNTER — Telehealth: Payer: Self-pay | Admitting: *Deleted

## 2019-02-01 NOTE — Telephone Encounter (Signed)
Received [facility] requesting PA for pregabalin 50mg  po caps.  (4 caps daily).  #360/90 days supply.  Insurance listed was not correct per CMM. I called and spoke to [facility] [phone].  Approved CASE ID [id number] from 01-02-19 thru 02-01-2020.  (reason approved (TID not effective).  They will send paper notification to pt and [facility].    I had LMVM for pt that I did not have correct insurance (wanted to verify).

## 2019-02-01 NOTE — Telephone Encounter (Signed)
[facility]   ID# [id number]

## 2019-02-27 ENCOUNTER — Other Ambulatory Visit: Payer: Self-pay | Admitting: Adult Health

## 2019-08-04 ENCOUNTER — Ambulatory Visit: Payer: Medicare Other | Admitting: Adult Health

## 2020-03-10 ENCOUNTER — Encounter (HOSPITAL_COMMUNITY)
Admission: EM | Disposition: A | Discharge status: Home Health | Payer: Self-pay | Source: Home / Self Care | Attending: Internal Medicine

## 2020-03-10 ENCOUNTER — Inpatient Hospital Stay (HOSPITAL_COMMUNITY): Payer: No Typology Code available for payment source | Admitting: Anesthesiology

## 2020-03-10 ENCOUNTER — Emergency Department (HOSPITAL_COMMUNITY): Payer: No Typology Code available for payment source

## 2020-03-10 ENCOUNTER — Encounter (HOSPITAL_COMMUNITY): Payer: Self-pay | Admitting: Internal Medicine

## 2020-03-10 ENCOUNTER — Inpatient Hospital Stay (HOSPITAL_COMMUNITY)
Admission: EM | Admit: 2020-03-10 | Discharge: 2020-03-17 | DRG: 853 | Disposition: A | Discharge status: Home Health | Payer: No Typology Code available for payment source | Attending: Internal Medicine | Admitting: Internal Medicine

## 2020-03-10 ENCOUNTER — Inpatient Hospital Stay (HOSPITAL_COMMUNITY): Payer: No Typology Code available for payment source

## 2020-03-10 DIAGNOSIS — N138 Other obstructive and reflux uropathy: Secondary | ICD-10-CM | POA: Diagnosis present

## 2020-03-10 DIAGNOSIS — R7881 Bacteremia: Secondary | ICD-10-CM

## 2020-03-10 DIAGNOSIS — R05 Cough: Secondary | ICD-10-CM | POA: Diagnosis present

## 2020-03-10 DIAGNOSIS — B9620 Unspecified Escherichia coli [E. coli] as the cause of diseases classified elsewhere: Secondary | ICD-10-CM

## 2020-03-10 DIAGNOSIS — K744 Secondary biliary cirrhosis: Secondary | ICD-10-CM | POA: Diagnosis present

## 2020-03-10 DIAGNOSIS — R0789 Other chest pain: Secondary | ICD-10-CM | POA: Diagnosis not present

## 2020-03-10 DIAGNOSIS — E1121 Type 2 diabetes mellitus with diabetic nephropathy: Secondary | ICD-10-CM | POA: Diagnosis present

## 2020-03-10 DIAGNOSIS — D84821 Immunodeficiency due to drugs: Secondary | ICD-10-CM | POA: Diagnosis present

## 2020-03-10 DIAGNOSIS — N179 Acute kidney failure, unspecified: Secondary | ICD-10-CM | POA: Diagnosis present

## 2020-03-10 DIAGNOSIS — E872 Acidosis: Secondary | ICD-10-CM | POA: Diagnosis present

## 2020-03-10 DIAGNOSIS — D689 Coagulation defect, unspecified: Secondary | ICD-10-CM | POA: Diagnosis present

## 2020-03-10 DIAGNOSIS — K7581 Nonalcoholic steatohepatitis (NASH): Secondary | ICD-10-CM | POA: Diagnosis present

## 2020-03-10 DIAGNOSIS — Z8616 Personal history of COVID-19: Secondary | ICD-10-CM

## 2020-03-10 DIAGNOSIS — Z944 Liver transplant status: Secondary | ICD-10-CM | POA: Diagnosis not present

## 2020-03-10 DIAGNOSIS — G062 Extradural and subdural abscess, unspecified: Secondary | ICD-10-CM | POA: Diagnosis present

## 2020-03-10 DIAGNOSIS — N1832 Chronic kidney disease, stage 3b: Secondary | ICD-10-CM | POA: Diagnosis not present

## 2020-03-10 DIAGNOSIS — Y830 Surgical operation with transplant of whole organ as the cause of abnormal reaction of the patient, or of later complication, without mention of misadventure at the time of the procedure: Secondary | ICD-10-CM | POA: Diagnosis present

## 2020-03-10 DIAGNOSIS — R6521 Severe sepsis with septic shock: Secondary | ICD-10-CM | POA: Diagnosis present

## 2020-03-10 DIAGNOSIS — E1140 Type 2 diabetes mellitus with diabetic neuropathy, unspecified: Secondary | ICD-10-CM | POA: Diagnosis present

## 2020-03-10 DIAGNOSIS — A419 Sepsis, unspecified organism: Secondary | ICD-10-CM | POA: Diagnosis present

## 2020-03-10 DIAGNOSIS — Z6832 Body mass index (BMI) 32.0-32.9, adult: Secondary | ICD-10-CM

## 2020-03-10 DIAGNOSIS — Z993 Dependence on wheelchair: Secondary | ICD-10-CM | POA: Diagnosis not present

## 2020-03-10 DIAGNOSIS — D631 Anemia in chronic kidney disease: Secondary | ICD-10-CM | POA: Diagnosis present

## 2020-03-10 DIAGNOSIS — Z940 Kidney transplant status: Secondary | ICD-10-CM | POA: Diagnosis not present

## 2020-03-10 DIAGNOSIS — E1122 Type 2 diabetes mellitus with diabetic chronic kidney disease: Secondary | ICD-10-CM | POA: Diagnosis present

## 2020-03-10 DIAGNOSIS — K439 Ventral hernia without obstruction or gangrene: Secondary | ICD-10-CM | POA: Diagnosis present

## 2020-03-10 DIAGNOSIS — N10 Acute tubulo-interstitial nephritis: Secondary | ICD-10-CM | POA: Diagnosis not present

## 2020-03-10 DIAGNOSIS — Z833 Family history of diabetes mellitus: Secondary | ICD-10-CM

## 2020-03-10 DIAGNOSIS — Z7952 Long term (current) use of systemic steroids: Secondary | ICD-10-CM

## 2020-03-10 DIAGNOSIS — Z419 Encounter for procedure for purposes other than remedying health state, unspecified: Secondary | ICD-10-CM

## 2020-03-10 DIAGNOSIS — R6520 Severe sepsis without septic shock: Secondary | ICD-10-CM | POA: Diagnosis not present

## 2020-03-10 DIAGNOSIS — F329 Major depressive disorder, single episode, unspecified: Secondary | ICD-10-CM | POA: Diagnosis present

## 2020-03-10 DIAGNOSIS — E1142 Type 2 diabetes mellitus with diabetic polyneuropathy: Secondary | ICD-10-CM | POA: Diagnosis present

## 2020-03-10 DIAGNOSIS — T8613 Kidney transplant infection: Secondary | ICD-10-CM | POA: Diagnosis present

## 2020-03-10 DIAGNOSIS — N183 Chronic kidney disease, stage 3 unspecified: Secondary | ICD-10-CM | POA: Diagnosis present

## 2020-03-10 DIAGNOSIS — F05 Delirium due to known physiological condition: Secondary | ICD-10-CM | POA: Diagnosis present

## 2020-03-10 DIAGNOSIS — Z794 Long term (current) use of insulin: Secondary | ICD-10-CM

## 2020-03-10 DIAGNOSIS — R161 Splenomegaly, not elsewhere classified: Secondary | ICD-10-CM | POA: Diagnosis present

## 2020-03-10 DIAGNOSIS — T8619 Other complication of kidney transplant: Secondary | ICD-10-CM | POA: Diagnosis present

## 2020-03-10 DIAGNOSIS — Z8661 Personal history of infections of the central nervous system: Secondary | ICD-10-CM | POA: Diagnosis not present

## 2020-03-10 DIAGNOSIS — U071 COVID-19: Secondary | ICD-10-CM

## 2020-03-10 DIAGNOSIS — K7460 Unspecified cirrhosis of liver: Secondary | ICD-10-CM | POA: Diagnosis present

## 2020-03-10 DIAGNOSIS — N390 Urinary tract infection, site not specified: Secondary | ICD-10-CM

## 2020-03-10 DIAGNOSIS — T8649 Other complications of liver transplant: Secondary | ICD-10-CM | POA: Diagnosis present

## 2020-03-10 DIAGNOSIS — Z7982 Long term (current) use of aspirin: Secondary | ICD-10-CM

## 2020-03-10 DIAGNOSIS — E11649 Type 2 diabetes mellitus with hypoglycemia without coma: Secondary | ICD-10-CM | POA: Diagnosis not present

## 2020-03-10 DIAGNOSIS — Z8249 Family history of ischemic heart disease and other diseases of the circulatory system: Secondary | ICD-10-CM

## 2020-03-10 DIAGNOSIS — K5900 Constipation, unspecified: Secondary | ICD-10-CM | POA: Diagnosis present

## 2020-03-10 DIAGNOSIS — A4151 Sepsis due to Escherichia coli [E. coli]: Secondary | ICD-10-CM | POA: Diagnosis present

## 2020-03-10 DIAGNOSIS — L89891 Pressure ulcer of other site, stage 1: Secondary | ICD-10-CM | POA: Diagnosis present

## 2020-03-10 DIAGNOSIS — R54 Age-related physical debility: Secondary | ICD-10-CM | POA: Diagnosis present

## 2020-03-10 DIAGNOSIS — E876 Hypokalemia: Secondary | ICD-10-CM | POA: Diagnosis not present

## 2020-03-10 DIAGNOSIS — D696 Thrombocytopenia, unspecified: Secondary | ICD-10-CM | POA: Diagnosis present

## 2020-03-10 DIAGNOSIS — N132 Hydronephrosis with renal and ureteral calculous obstruction: Secondary | ICD-10-CM

## 2020-03-10 DIAGNOSIS — K222 Esophageal obstruction: Secondary | ICD-10-CM | POA: Diagnosis present

## 2020-03-10 HISTORY — PX: CYSTOSCOPY WITH STENT PLACEMENT: SHX5790

## 2020-03-10 HISTORY — DX: Personal history of COVID-19: Z86.16

## 2020-03-10 LAB — URINALYSIS, ROUTINE W REFLEX MICROSCOPIC
Bilirubin Urine: NEGATIVE
Glucose, UA: NEGATIVE mg/dL
Ketones, ur: NEGATIVE mg/dL
Nitrite: POSITIVE — AB
Protein, ur: 100 mg/dL — AB
RBC / HPF: >50 RBC/hpf — ABNORMAL HIGH (ref 0–5)
Specific Gravity, Urine: 1.014 (ref 1.005–1.030)
WBC, UA: >50 WBC/hpf — ABNORMAL HIGH (ref 0–5)
pH: 5 (ref 5.0–8.0)

## 2020-03-10 LAB — BLOOD CULTURE ID PANEL (REFLEXED) - BCID2

## 2020-03-10 LAB — I-STAT VENOUS BLOOD GAS, ED
Acid-base deficit: 6 mmol/L — ABNORMAL HIGH (ref 0.0–2.0)
Bicarbonate: 17 mmol/L — ABNORMAL LOW (ref 20.0–28.0)
Calcium, Ion: 1.11 mmol/L — ABNORMAL LOW (ref 1.15–1.40)
HCT: 36 % — ABNORMAL LOW (ref 39.0–52.0)
Hemoglobin: 12.2 g/dL — ABNORMAL LOW (ref 13.0–17.0)
O2 Saturation: 93 %
Potassium: 3.2 mmol/L — ABNORMAL LOW (ref 3.5–5.1)
Sodium: 145 mmol/L (ref 135–145)
TCO2: 18 mmol/L — ABNORMAL LOW (ref 22–32)
pCO2, Ven: 27.6 mmHg — ABNORMAL LOW (ref 44.0–60.0)
pH, Ven: 7.398 (ref 7.250–7.430)
pO2, Ven: 66 mmHg — ABNORMAL HIGH (ref 32.0–45.0)

## 2020-03-10 LAB — CBC WITH DIFFERENTIAL/PLATELET
Abs Immature Granulocytes: 0.02 10*3/uL (ref 0.00–0.07)
Basophils Absolute: 0 10*3/uL (ref 0.0–0.1)
Basophils Relative: 0 %
Eosinophils Absolute: 0 10*3/uL (ref 0.0–0.5)
Eosinophils Relative: 0 %
HCT: 45.1 % (ref 39.0–52.0)
Hemoglobin: 13.9 g/dL (ref 13.0–17.0)
Immature Granulocytes: 1 %
Lymphocytes Relative: 4 %
Lymphs Abs: 0.2 10*3/uL — ABNORMAL LOW (ref 0.7–4.0)
MCH: 23.1 pg — ABNORMAL LOW (ref 26.0–34.0)
MCHC: 30.8 g/dL (ref 30.0–36.0)
MCV: 74.9 fL — ABNORMAL LOW (ref 80.0–100.0)
Monocytes Absolute: 0 10*3/uL — ABNORMAL LOW (ref 0.1–1.0)
Monocytes Relative: 1 %
Neutro Abs: 3.7 10*3/uL (ref 1.7–7.7)
Neutrophils Relative %: 94 %
Platelets: 95 10*3/uL — ABNORMAL LOW (ref 150–400)
RBC: 6.02 MIL/uL — ABNORMAL HIGH (ref 4.22–5.81)
RDW: 16.6 % — ABNORMAL HIGH (ref 11.5–15.5)
WBC: 3.9 10*3/uL — ABNORMAL LOW (ref 4.0–10.5)
nRBC: 0 % (ref 0.0–0.2)

## 2020-03-10 LAB — CREATININE, SERUM
Creatinine, Ser: 2.74 mg/dL — ABNORMAL HIGH (ref 0.61–1.24)
GFR calc Af Amer: 27 mL/min — ABNORMAL LOW (ref >60)
GFR calc non Af Amer: 23 mL/min — ABNORMAL LOW (ref >60)

## 2020-03-10 LAB — COMPREHENSIVE METABOLIC PANEL
ALT: 19 U/L (ref 0–44)
AST: 31 U/L (ref 15–41)
Albumin: 3.1 g/dL — ABNORMAL LOW (ref 3.5–5.0)
Alkaline Phosphatase: 82 U/L (ref 38–126)
Anion gap: 13 (ref 5–15)
BUN: 32 mg/dL — ABNORMAL HIGH (ref 8–23)
CO2: 18 mmol/L — ABNORMAL LOW (ref 22–32)
Calcium: 8.8 mg/dL — ABNORMAL LOW (ref 8.9–10.3)
Chloride: 108 mmol/L (ref 98–111)
Creatinine, Ser: 2.5 mg/dL — ABNORMAL HIGH (ref 0.61–1.24)
GFR calc Af Amer: 30 mL/min — ABNORMAL LOW (ref >60)
GFR calc non Af Amer: 26 mL/min — ABNORMAL LOW (ref >60)
Glucose, Bld: 109 mg/dL — ABNORMAL HIGH (ref 70–99)
Potassium: 4 mmol/L (ref 3.5–5.1)
Sodium: 139 mmol/L (ref 135–145)
Total Bilirubin: 1 mg/dL (ref 0.3–1.2)
Total Protein: 6.3 g/dL — ABNORMAL LOW (ref 6.5–8.1)

## 2020-03-10 LAB — CBC
HCT: 40.7 % (ref 39.0–52.0)
Hemoglobin: 12.4 g/dL — ABNORMAL LOW (ref 13.0–17.0)
MCH: 24.2 pg — ABNORMAL LOW (ref 26.0–34.0)
MCHC: 30.5 g/dL (ref 30.0–36.0)
MCV: 79.3 fL — ABNORMAL LOW (ref 80.0–100.0)
Platelets: 105 10*3/uL — ABNORMAL LOW (ref 150–400)
RBC: 5.13 MIL/uL (ref 4.22–5.81)
RDW: 17 % — ABNORMAL HIGH (ref 11.5–15.5)
WBC: 25 10*3/uL — ABNORMAL HIGH (ref 4.0–10.5)
nRBC: 0 % (ref 0.0–0.2)

## 2020-03-10 LAB — AMMONIA: Ammonia: 48 umol/L — ABNORMAL HIGH (ref 9–35)

## 2020-03-10 LAB — HEMOGLOBIN A1C
Hgb A1c MFr Bld: 6 % — ABNORMAL HIGH (ref 4.8–5.6)
Mean Plasma Glucose: 125.5 mg/dL

## 2020-03-10 LAB — GLUCOSE, CAPILLARY
Glucose-Capillary: 123 mg/dL — ABNORMAL HIGH (ref 70–99)
Glucose-Capillary: 107 mg/dL — ABNORMAL HIGH (ref 70–99)

## 2020-03-10 LAB — PROTIME-INR
INR: 1.2 (ref 0.8–1.2)
Prothrombin Time: 14.3 seconds (ref 11.4–15.2)

## 2020-03-10 LAB — LIPASE, BLOOD: Lipase: 34 U/L (ref 11–51)

## 2020-03-10 LAB — SARS CORONAVIRUS 2 BY RT PCR (HOSPITAL ORDER, PERFORMED IN ~~LOC~~ HOSPITAL LAB): SARS Coronavirus 2: POSITIVE — AB

## 2020-03-10 LAB — LACTIC ACID, PLASMA
Lactic Acid, Venous: 3.2 mmol/L (ref 0.5–1.9)
Lactic Acid, Venous: 3.7 mmol/L (ref 0.5–1.9)

## 2020-03-10 LAB — TROPONIN I (HIGH SENSITIVITY)
Troponin I (High Sensitivity): 46 ng/L — ABNORMAL HIGH (ref <18)
Troponin I (High Sensitivity): 90 ng/L — ABNORMAL HIGH (ref <18)

## 2020-03-10 LAB — MRSA PCR SCREENING: MRSA by PCR: POSITIVE — AB

## 2020-03-10 LAB — HIV ANTIBODY (ROUTINE TESTING W REFLEX): HIV Screen 4th Generation wRfx: NONREACTIVE

## 2020-03-10 LAB — APTT: aPTT: 28 seconds (ref 24–36)

## 2020-03-10 SURGERY — CYSTOSCOPY, WITH STENT INSERTION
Anesthesia: General | Laterality: Left

## 2020-03-10 MED ORDER — STERILE WATER FOR IRRIGATION IR SOLN
Status: DC | PRN
  Administered 2020-03-10: 3000 mL

## 2020-03-10 MED ORDER — PROPOFOL 10 MG/ML IV BOLUS
INTRAVENOUS | Status: DC | PRN
  Administered 2020-03-10: 70 mg via INTRAVENOUS

## 2020-03-10 MED ORDER — LIDOCAINE HCL (CARDIAC) PF 100 MG/5ML IV SOSY
PREFILLED_SYRINGE | INTRAVENOUS | Status: DC | PRN
  Administered 2020-03-10: 40 mg via INTRATRACHEAL

## 2020-03-10 MED ORDER — VASOPRESSIN 20 UNIT/ML IV SOLN
INTRAVENOUS | Status: AC
  Filled 2020-03-10: qty 1

## 2020-03-10 MED ORDER — VANCOMYCIN HCL IN DEXTROSE 1-5 GM/200ML-% IV SOLN
1000.0000 mg | Freq: Once | INTRAVENOUS | Status: DC
  Filled 2020-03-10: qty 200

## 2020-03-10 MED ORDER — HYDROCORTISONE NA SUCCINATE PF 100 MG IJ SOLR
100.0000 mg | Freq: Once | INTRAMUSCULAR | Status: AC
  Administered 2020-03-10: 100 mg via INTRAVENOUS
  Filled 2020-03-10: qty 2

## 2020-03-10 MED ORDER — VANCOMYCIN HCL IN DEXTROSE 1-5 GM/200ML-% IV SOLN: 1000.0000 mg | INTRAVENOUS | Status: DC

## 2020-03-10 MED ORDER — LACTATED RINGERS IV SOLN: INTRAVENOUS | Status: AC

## 2020-03-10 MED ORDER — SODIUM CHLORIDE 0.9 % IV SOLN
2.0000 g | Freq: Two times a day (BID) | INTRAVENOUS | Status: DC
  Administered 2020-03-10 – 2020-03-12 (×4): 2 g via INTRAVENOUS
  Filled 2020-03-10 (×4): qty 2

## 2020-03-10 MED ORDER — SUCCINYLCHOLINE CHLORIDE 20 MG/ML IJ SOLN
INTRAMUSCULAR | Status: DC | PRN
  Administered 2020-03-10: 100 mg via INTRAVENOUS

## 2020-03-10 MED ORDER — NOREPINEPHRINE 4 MG/250ML-% IV SOLN
0.0000 ug/min | INTRAVENOUS | Status: DC
  Administered 2020-03-10: 2 ug/min via INTRAVENOUS
  Administered 2020-03-11: 4 ug/min via INTRAVENOUS
  Filled 2020-03-10 (×3): qty 250

## 2020-03-10 MED ORDER — CHLORHEXIDINE GLUCONATE CLOTH 2 % EX PADS
6.0000 | MEDICATED_PAD | Freq: Every day | CUTANEOUS | Status: AC
  Administered 2020-03-11 – 2020-03-15 (×5): 6 via TOPICAL

## 2020-03-10 MED ORDER — CHLORHEXIDINE GLUCONATE CLOTH 2 % EX PADS
6.0000 | MEDICATED_PAD | Freq: Every day | CUTANEOUS | Status: DC
  Administered 2020-03-10: 6 via TOPICAL

## 2020-03-10 MED ORDER — SODIUM CHLORIDE 0.9 % IV BOLUS (SEPSIS)
1000.0000 mL | Freq: Once | INTRAVENOUS | Status: AC
  Administered 2020-03-10: 1000 mL via INTRAVENOUS

## 2020-03-10 MED ORDER — MIDAZOLAM HCL 5 MG/5ML IJ SOLN
INTRAMUSCULAR | Status: DC | PRN
  Administered 2020-03-10: 1 mg via INTRAVENOUS

## 2020-03-10 MED ORDER — VASOPRESSIN 20 UNIT/ML IV SOLN
INTRAVENOUS | Status: DC | PRN
  Administered 2020-03-10: 1 [IU] via INTRAVENOUS

## 2020-03-10 MED ORDER — METRONIDAZOLE IN NACL 5-0.79 MG/ML-% IV SOLN
500.0000 mg | Freq: Once | INTRAVENOUS | Status: AC
  Administered 2020-03-10: 500 mg via INTRAVENOUS
  Filled 2020-03-10: qty 100

## 2020-03-10 MED ORDER — SODIUM CHLORIDE 0.9 % IV BOLUS (SEPSIS): 1000.0000 mL | Freq: Once | INTRAVENOUS | Status: DC

## 2020-03-10 MED ORDER — ACETAMINOPHEN 500 MG PO TABS
1000.0000 mg | ORAL_TABLET | Freq: Once | ORAL | Status: AC
  Administered 2020-03-10: 1000 mg via ORAL
  Filled 2020-03-10: qty 2

## 2020-03-10 MED ORDER — MIDAZOLAM HCL 2 MG/2ML IJ SOLN
INTRAMUSCULAR | Status: AC
  Filled 2020-03-10: qty 2

## 2020-03-10 MED ORDER — SODIUM CHLORIDE 0.9 % IV SOLN
2.0000 g | Freq: Once | INTRAVENOUS | Status: AC
  Administered 2020-03-10: 2 g via INTRAVENOUS
  Filled 2020-03-10: qty 2

## 2020-03-10 MED ORDER — SODIUM CHLORIDE 0.9 % IV BOLUS (SEPSIS): 500.0000 mL | Freq: Once | INTRAVENOUS | Status: DC

## 2020-03-10 MED ORDER — LACTATED RINGERS IV SOLN: INTRAVENOUS | Status: DC

## 2020-03-10 MED ORDER — VANCOMYCIN HCL 2000 MG/400ML IV SOLN
2000.0000 mg | INTRAVENOUS | Status: AC
  Administered 2020-03-10: 2000 mg via INTRAVENOUS
  Filled 2020-03-10: qty 400

## 2020-03-10 MED ORDER — LIDOCAINE HCL URETHRAL/MUCOSAL 2 % EX GEL
CUTANEOUS | Status: AC
  Filled 2020-03-10: qty 11

## 2020-03-10 MED ORDER — FENTANYL CITRATE (PF) 250 MCG/5ML IJ SOLN
INTRAMUSCULAR | Status: DC | PRN
  Administered 2020-03-10: 50 ug via INTRAVENOUS

## 2020-03-10 MED ORDER — SODIUM CHLORIDE 0.9 % IV SOLN: 2.0000 g | INTRAVENOUS | Status: DC

## 2020-03-10 MED ORDER — INSULIN ASPART 100 UNIT/ML ~~LOC~~ SOLN
0.0000 [IU] | SUBCUTANEOUS | Status: DC
  Administered 2020-03-11 – 2020-03-14 (×7): 2 [IU] via SUBCUTANEOUS

## 2020-03-10 MED ORDER — ENOXAPARIN SODIUM 40 MG/0.4ML ~~LOC~~ SOLN
40.0000 mg | SUBCUTANEOUS | Status: DC
  Administered 2020-03-11 – 2020-03-16 (×6): 40 mg via SUBCUTANEOUS
  Filled 2020-03-10 (×6): qty 0.4

## 2020-03-10 MED ORDER — ACETAMINOPHEN 650 MG RE SUPP
650.0000 mg | Freq: Once | RECTAL | Status: AC
  Administered 2020-03-10: 650 mg via RECTAL
  Filled 2020-03-10: qty 1

## 2020-03-10 MED ORDER — LACTATED RINGERS IV SOLN: INTRAVENOUS | Status: DC | PRN

## 2020-03-10 MED ORDER — FENTANYL CITRATE (PF) 250 MCG/5ML IJ SOLN
INTRAMUSCULAR | Status: AC
Start: 2020-03-10 — End: ?
  Filled 2020-03-10: qty 5

## 2020-03-10 MED ORDER — PROPOFOL 10 MG/ML IV BOLUS
INTRAVENOUS | Status: AC
  Filled 2020-03-10: qty 20

## 2020-03-10 MED ORDER — HYDROCORTISONE NA SUCCINATE PF 100 MG IJ SOLR
50.0000 mg | Freq: Four times a day (QID) | INTRAMUSCULAR | Status: DC
  Administered 2020-03-10 – 2020-03-12 (×6): 50 mg via INTRAVENOUS
  Filled 2020-03-10 (×6): qty 2

## 2020-03-10 MED ORDER — SODIUM CHLORIDE 0.9 % IV SOLN
4.0000 mg/kg | Freq: Every day | INTRAVENOUS | Status: DC
  Filled 2020-03-10: qty 8.11

## 2020-03-10 MED ORDER — ONDANSETRON HCL 4 MG/2ML IJ SOLN
INTRAMUSCULAR | Status: DC | PRN
  Administered 2020-03-10: 4 mg via INTRAVENOUS

## 2020-03-10 MED ORDER — MUPIROCIN 2 % EX OINT
1.0000 "application " | TOPICAL_OINTMENT | Freq: Two times a day (BID) | CUTANEOUS | Status: AC
  Administered 2020-03-11 – 2020-03-15 (×10): 1 via NASAL
  Filled 2020-03-10 (×6): qty 22

## 2020-03-10 MED ORDER — INSULIN GLARGINE 100 UNIT/ML ~~LOC~~ SOLN
10.0000 [IU] | Freq: Every day | SUBCUTANEOUS | Status: DC
  Administered 2020-03-10 – 2020-03-11 (×2): 10 [IU] via SUBCUTANEOUS
  Filled 2020-03-10 (×3): qty 0.1

## 2020-03-10 MED ORDER — SODIUM CHLORIDE 0.9 % IV SOLN
4.0000 mg/kg | Freq: Every day | INTRAVENOUS | Status: DC
  Administered 2020-03-10: 405.5 mg via INTRAVENOUS
  Filled 2020-03-10 (×2): qty 8.11

## 2020-03-10 MED ORDER — IOHEXOL 300 MG/ML  SOLN
INTRAMUSCULAR | Status: DC | PRN
  Administered 2020-03-10: 10 mL

## 2020-03-10 MED ORDER — ONDANSETRON HCL 4 MG/2ML IJ SOLN
INTRAMUSCULAR | Status: AC
  Administered 2020-03-10: 4 mg
  Filled 2020-03-10: qty 2

## 2020-03-10 SURGICAL SUPPLY — 23 items
BAG DRN RND TRDRP ANRFLXCHMBR (UROLOGICAL SUPPLIES)
BAG URINE DRAIN 2000ML AR STRL (UROLOGICAL SUPPLIES) IMPLANT
BAG URO CATCHER STRL LF (MISCELLANEOUS) ×3 IMPLANT
CATH FOLEY 2WAY SLVR  5CC 16FR (CATHETERS) ×6
CATH FOLEY 2WAY SLVR 5CC 16FR (CATHETERS) IMPLANT
CATH URET 5FR 28IN OPEN ENDED (CATHETERS) ×2 IMPLANT
Contour SOFT PERCUFLEX MATERIAL 6F X 26CM ×2 IMPLANT
GLOVE BIO SURGEON STRL SZ7.5 (GLOVE) ×3 IMPLANT
GOWN STRL REUS W/ TWL LRG LVL3 (GOWN DISPOSABLE) ×1 IMPLANT
GOWN STRL REUS W/ TWL XL LVL3 (GOWN DISPOSABLE) ×1 IMPLANT
GOWN STRL REUS W/TWL LRG LVL3 (GOWN DISPOSABLE) ×3
GOWN STRL REUS W/TWL XL LVL3 (GOWN DISPOSABLE) ×3
GUIDEWIRE ANG ZIPWIRE 038X150 (WIRE) IMPLANT
GUIDEWIRE STR DUAL SENSOR (WIRE) ×3 IMPLANT
KIT TURNOVER KIT B (KITS) ×3 IMPLANT
NS IRRIG 1000ML POUR BTL (IV SOLUTION) ×3 IMPLANT
PACK CYSTO (CUSTOM PROCEDURE TRAY) ×3 IMPLANT
STENT URET 6FRX26 CONTOUR (STENTS) ×1 IMPLANT
SYPHON OMNI JUG (MISCELLANEOUS) ×3 IMPLANT
TOWEL GREEN STERILE FF (TOWEL DISPOSABLE) ×3 IMPLANT
TUBE CONNECTING 12'X1/4 (SUCTIONS)
TUBE CONNECTING 12X1/4 (SUCTIONS) IMPLANT
WATER STERILE IRR 3000ML UROMA (IV SOLUTION) ×3 IMPLANT

## 2020-03-10 NOTE — Anesthesia Procedure Notes (Signed)
Procedure Name: Intubation Date/Time: 03/10/2020 4:16 PM Performed by: Clovis Cao, CRNA Pre-anesthesia Checklist: Patient identified, Emergency Drugs available, Suction available, Patient being monitored and Timeout performed Patient Re-evaluated:Patient Re-evaluated prior to induction Oxygen Delivery Method: Circle system utilized Preoxygenation: Pre-oxygenation with 100% oxygen Induction Type: IV induction, Rapid sequence and Cricoid Pressure applied Laryngoscope Size: Miller and 2 Grade View: Grade I Tube type: Oral Tube size: 8.0 mm Number of attempts: 1 Airway Equipment and Method: Stylet Placement Confirmation: ETT inserted through vocal cords under direct vision,  positive ETCO2 and breath sounds checked- equal and bilateral Secured at: 23 cm Tube secured with: Tape Dental Injury: Teeth and Oropharynx as per pre-operative assessment

## 2020-03-10 NOTE — Progress Notes (Signed)
Pharmacy Antibiotic Note  [name] is a 69 y.o. male admitted on 03/10/2020 with concern for sepsis. Pharmacy has been consulted for Vancomycin + Cefepime dosing.  The wife states the patient is 5'10" and most recent weight was 226 lb (102.5 kg). SCr 2.5, CrCl~30 ml/min, noted hx renal transplant, baseline appears to be SCr 2.8-3.2  Plan: - Vancomycin 2g IV x 1 followed by 1g/24h - Cefepime 2g IV x 1 followed by 2g IV every 12 hours - Flagyl 500 mg x 1 per EDP - Will continue to follow renal function, culture results, LOT, and antibiotic de-escalation plans     Temp (24hrs), Avg:105 F (40.6 C), Min:105 F (40.6 C), Max:105 F (40.6 C)  No results for input(s): WBC, CREATININE, LATICACIDVEN, VANCOTROUGH, VANCOPEAK, VANCORANDOM, GENTTROUGH, GENTPEAK, GENTRANDOM, TOBRATROUGH, TOBRAPEAK, TOBRARND, AMIKACINPEAK, AMIKACINTROU, AMIKACIN in the last 168 hours.  CrCl cannot be calculated (Patient's most recent lab result is older than the maximum 21 days allowed.).    Not on File  Antimicrobials this admission: Vancomycin 9/4 >> Cefepime 9/4 >> Flagyl 9/4 >>  Dose adjustments this admission: n/a  Microbiology results: 9/4 UCx >> 9/4 BCx  Thank you for allowing pharmacy to be a part of this patient's care.  Alycia Rossetti, PharmD, BCPS Clinical Pharmacist Clinical phone for 03/10/2020: [phone] 03/10/2020 7:49 AM   **Pharmacist phone directory can now be found on [email or web address] ([email or web address]).  Listed under [place].

## 2020-03-10 NOTE — ED Provider Notes (Signed)
[facility] EMERGENCY DEPARTMENT Provider Note   CSN: [id number] Arrival date & time: 03/10/20  5885     History Chief Complaint  Patient presents with  . Cough  . Nausea    [name] is a 69 y.o. male.  HPI   Pt is a 69 y/o male with a h/o epidural abscess of the cervical spine, anemia, esophageal varices, coagulopathy, diabetes, [place], thrombocytopenia, s/p kidney/liver transplant coming in for evaluation of fevers.  Patient states he has been feeling unwell for 5 days.  He reports a cough nausea vomiting and abdominal pain.  He also reports he has been constipated for the last 4 days.  Additional history was obtained from the patient's wife who is at bedside.  She states that the patient has had a cough for the last 3 weeks.  She states that he started having nausea vomiting abdominal pain yesterday.  She does also assert that patient has been constipated.  She states his urine has been cloudy which is abnormal for him and he had a fever yesterday.  She does state he has been able to take his immunosuppressive medication and the rest of his medication.  She notes that he recently had  Past Medical History:  Diagnosis Date  . Abscess in epidural space of cervical spine 08/13/2015  . Acute hepatic encephalopathy   . Acute renal failure superimposed on stage 3 chronic kidney disease ([place])   . Anemia of chronic disease   . Ascites   . Bilateral carpal tunnel syndrome 01/27/2018  . Bleeding esophageal varices ([place])   . Coagulopathy ([place])   . Depression   . Diabetes mellitus type II, controlled ([place])   . Diabetic neuropathy ([place])   . Esophageal stricture   . NASH (nonalcoholic steatohepatitis)   . Protein-calorie malnutrition, severe ([place])   . Secondary biliary cirrhosis ([place])   . Thrombocytopenia [facility])     Patient Active Problem List   Diagnosis Date Noted  . Septic shock ([place]) 03/10/2020  . Bilateral carpal tunnel syndrome 01/27/2018  . Secondary  esophageal varices without bleeding ([place])   . Discitis of cervical region   . Severe protein-calorie malnutrition ([place])   . Hypokalemia   . Pancytopenia ([place]) 09/14/2015  . Acidosis, metabolic 02/77/4128  . Pressure ulcer 09/09/2015  . Elevated lactic acid level   . Palliative care encounter 08/28/2015  . Hepatic encephalopathy ([place]) 08/27/2015  . Extradural and subdural abscess, 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/2015  . Myelitis ([place]) 08/13/2015  . Abscess in epidural space of cervical spine 08/13/2015  . Secondary biliary cirrhosis ([place])   . Protein-calorie malnutrition, severe 07/26/2015  . Ascites 07/25/2015  . Acute renal failure superimposed on stage 3 chronic kidney disease ([place]) 07/25/2015  . Anemia of chronic disease 07/25/2015  . Depression 07/25/2015  . Controlled type 2 diabetes mellitus with diabetic nephropathy, without long-term current use of insulin ([place]) 07/25/2015  . Diabetic neuropathy ([place]) 07/25/2015  . Coagulopathy ([place]) 07/25/2015  . NASH (nonalcoholic steatohepatitis) 07/25/2015  . Body mass index (BMI) of 26.0-26.9 in adult 05/10/2015  . Other ascites 04/25/2015  . Acute hepatic encephalopathy 01/30/2015  . Thrombocytopenia ([place]) 10/22/2014  . Bleeding esophageal varices ([place]) 10/27/2012  . Hepatic cirrhosis ([place]) 07/12/2012  . Obesity, unspecified 07/12/2012  . Portal hypertension ([place]) 07/12/2012  . Type 2 diabetes mellitus ([place]) 07/12/2012  . ESOPHAGEAL STRICTURE 09/23/2004    Past Surgical History:  Procedure Laterality Date  . COLONOSCOPY    . ESOPHAGEAL BANDING Bilateral 11/29/2013  Procedure: ESOPHAGEAL BANDING;  Surgeon: Irene Shipper, MD;  Location: [facility] ENDOSCOPY;  Service: Endoscopy;  Laterality: Bilateral;  . ESOPHAGEAL BANDING N/A 06/13/2014   Procedure: ESOPHAGEAL BANDING;  Surgeon: Irene Shipper, MD;  Location: [facility] ENDOSCOPY;  Service: Endoscopy;  Laterality: N/A;  . ESOPHAGOGASTRODUODENOSCOPY  08/01/2011   Procedure: ESOPHAGOGASTRODUODENOSCOPY (EGD);   Surgeon: Scarlette Shorts, MD;  Location: [facility];  Service: Endoscopy;  Laterality: N/A;  . ESOPHAGOGASTRODUODENOSCOPY N/A 10/27/2012   Procedure: ESOPHAGOGASTRODUODENOSCOPY (EGD);  Surgeon: Inda Castle, MD;  Location: [facility];  Service: Endoscopy;  Laterality: N/A;  . ESOPHAGOGASTRODUODENOSCOPY N/A 11/22/2012   Procedure: ESOPHAGOGASTRODUODENOSCOPY (EGD);  Surgeon: Irene Shipper, MD;  Location: [facility];  Service: Endoscopy;  Laterality: N/A;  . ESOPHAGOGASTRODUODENOSCOPY N/A 11/29/2013   Procedure: ESOPHAGOGASTRODUODENOSCOPY (EGD);  Surgeon: Irene Shipper, MD;  Location: [facility];  Service: Endoscopy;  Laterality: N/A;  . ESOPHAGOGASTRODUODENOSCOPY N/A 06/13/2014   Procedure: ESOPHAGOGASTRODUODENOSCOPY (EGD);  Surgeon: Irene Shipper, MD;  Location: [facility];  Service: Endoscopy;  Laterality: N/A;  . KIDNEY STONE SURGERY  yrs ago  . 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 UPPER GASTROINTESTINAL ENDOSCOPY         Family History  Problem Relation Age of Onset  . Diabetes Father   . Heart disease Father   . Aneurysm Mother   . Heart disease Other        neice  . Diabetes Unknown        niece  . Colon cancer Neg Hx   . Anesthesia problems Neg Hx   . Hypotension Neg Hx   . Malignant hyperthermia Neg Hx   . Pseudochol deficiency Neg Hx     Social History   Tobacco Use  . Smoking status: Never Smoker  . Smokeless tobacco: Never Used  Substance Use Topics  . Alcohol use: No    Alcohol/week: 0.0 standard drinks    Comment: very rare  . Drug use: No    Home Medications Prior to Admission medications   Medication Sig Start Date End Date Taking? Authorizing Provider  acetaminophen (TYLENOL) 325 MG tablet Take 650 mg by mouth every 8 (eight) hours as needed.  11/20/15  Yes [provider]  aspirin EC 81 MG tablet Take 81 mg by mouth daily.  11/15/15  Yes [provider]  HUMALOG KWIKPEN 100 UNIT/ML KiwkPen Inject 24-26 Units into the skin in the morning and at  bedtime. Use 24 units in the AM and 26 units at bedtime 04/11/16  Yes [provider]  insulin glargine, 1 Unit Dial, (TOUJEO SOLOSTAR) 300 UNIT/ML Solostar Pen Inject 48 Units into the skin at bedtime. 06/08/19  Yes [provider]  mycophenolate (CELLCEPT) 250 MG capsule Take 2 capsules by mouth 2 (two) times daily. 06/09/17  Yes [provider]  omeprazole (PRILOSEC) 20 MG capsule Take 20 mg by mouth 2 (two) times daily. 02/13/20  Yes [provider]  predniSONE (DELTASONE) 5 MG tablet Take 5 mg by mouth daily.  01/01/16  Yes [provider]  pregabalin (LYRICA) 50 MG capsule TAKE 1 CAPSULE BY MOUTH IN THE MORNING, TAKE 1 CAPSULE MIDDAY AND 2 IN THE EVENING 01/26/19  Yes [name], NP  sertraline (ZOLOFT) 50 MG tablet Take 50 mg by mouth daily. 02/26/20  Yes [provider]  Tacrolimus (ENVARSUS XR) 1 MG TB24 Take 4 mg by mouth daily.    Yes [provider]  traZODone (DESYREL) 50 MG tablet Take 50 mg by mouth at  bedtime. 02/10/20  Yes [provider]  carvedilol (COREG) 3.125 MG tablet Take 1 tablet by mouth 2 (two) times daily with a meal.  Patient not taking: Reported on 03/10/2020 04/14/16   [provider]  senna-docusate (SENOKOT-S) 8.6-50 MG tablet Take 2 tablets by mouth 2 (two) times daily.  11/15/15   [provider]    Allergies    Patient has no known allergies.  Review of Systems   Review of Systems  Unable to perform ROS: Mental status change  Constitutional: Positive for fever.  HENT: Negative for ear pain and sore throat.   Eyes: Negative for visual disturbance.  Respiratory: Positive for cough and shortness of breath.   Cardiovascular: Negative for chest pain.  Gastrointestinal: Positive for abdominal pain, constipation, nausea and vomiting. Negative for diarrhea.  Genitourinary:       Malodorous urine  Musculoskeletal: Negative for back pain.  Skin: Negative for color change and rash.    Neurological:       AMS  All other systems reviewed and are negative.   Physical Exam Updated Vital Signs BP 98/64   Pulse (!) 103   Temp (!) 102 F (38.9 C) (Rectal)   Resp (!) 25   Ht 5\' 10"  (1.778 m)   Wt 102.5 kg   SpO2 100%   BMI 32.43 kg/m   Physical Exam Vitals and nursing note reviewed.  Constitutional:      General: He is in acute distress.     Appearance: He is well-developed. He is ill-appearing, toxic-appearing and diaphoretic.  HENT:     Head: Normocephalic and atraumatic.  Eyes:     Conjunctiva/sclera: Conjunctivae normal.  Cardiovascular:     Rate and Rhythm: Regular rhythm. Tachycardia present.     Heart sounds: Normal heart sounds. No murmur heard.   Pulmonary:     Effort: No respiratory distress.     Breath sounds: Decreased breath sounds present.     Comments: tachypneic Abdominal:     General: Bowel sounds are normal.     Palpations: Abdomen is soft.     Tenderness: There is abdominal tenderness (generally).  Musculoskeletal:     Cervical back: Neck supple.     Comments: Trace ble swelling  Skin:    General: Skin is warm.  Neurological:     Mental Status: He is alert.     ED Results / Procedures / Treatments   Labs (all labs ordered are listed, but only abnormal results are displayed) Labs Reviewed  SARS CORONAVIRUS 2 BY RT PCR ([facility]) - Abnormal; Notable for the following components:      Result Value   SARS Coronavirus 2 POSITIVE (*)    All other components within normal limits  LACTIC ACID, PLASMA - Abnormal; Notable for the following components:   Lactic Acid, Venous 3.2 (*)    All other components within normal limits  LACTIC ACID, PLASMA - Abnormal; Notable for the following components:   Lactic Acid, Venous 3.7 (*)    All other components within normal limits  COMPREHENSIVE METABOLIC PANEL - Abnormal; Notable for the following components:   CO2 18 (*)    Glucose, Bld 109 (*)     BUN 32 (*)    Creatinine, Ser 2.50 (*)    Calcium 8.8 (*)    Total Protein 6.3 (*)    Albumin 3.1 (*)    GFR calc non Af Amer 26 (*)    GFR calc  Af Amer 30 (*)    All other components within normal limits  CBC WITH DIFFERENTIAL/PLATELET - Abnormal; Notable for the following components:   WBC 3.9 (*)    RBC 6.02 (*)    MCV 74.9 (*)    MCH 23.1 (*)    RDW 16.6 (*)    Platelets 95 (*)    Lymphs Abs 0.2 (*)    Monocytes Absolute 0.0 (*)    All other components within normal limits  URINALYSIS, ROUTINE W REFLEX MICROSCOPIC - Abnormal; Notable for the following components:   Color, Urine AMBER (*)    APPearance TURBID (*)    Hgb urine dipstick LARGE (*)    Protein, ur 100 (*)    Nitrite POSITIVE (*)    Leukocytes,Ua MODERATE (*)    RBC / HPF >50 (*)    WBC, UA >50 (*)    Bacteria, UA FEW (*)    Non Squamous Epithelial 0-5 (*)    All other components within normal limits  AMMONIA - Abnormal; Notable for the following components:   Ammonia 48 (*)    All other components within normal limits  I-STAT VENOUS BLOOD GAS, ED - Abnormal; Notable for the following components:   pCO2, Ven 27.6 (*)    pO2, Ven 66.0 (*)    Bicarbonate 17.0 (*)    TCO2 18 (*)    Acid-base deficit 6.0 (*)    Potassium 3.2 (*)    Calcium, Ion 1.11 (*)    HCT 36.0 (*)    Hemoglobin 12.2 (*)    All other components within normal limits  TROPONIN I (HIGH SENSITIVITY) - Abnormal; Notable for the following components:   Troponin I (High Sensitivity) 46 (*)    All other components within normal limits  TROPONIN I (HIGH SENSITIVITY) - Abnormal; Notable for the following components:   Troponin I (High Sensitivity) 90 (*)    All other components within normal limits  CULTURE, BLOOD (ROUTINE X 2)  CULTURE, BLOOD (ROUTINE X 2)  URINE CULTURE  CULTURE, BLOOD (ROUTINE X 2)  CULTURE, BLOOD (ROUTINE X 2)  PROTIME-INR  APTT  LIPASE, BLOOD  HIV ANTIBODY (ROUTINE TESTING W REFLEX)  CBC  CREATININE, SERUM     EKG EKG Interpretation  Date/Time:  Saturday March 10 2020 06:39:43 EDT Ventricular Rate:  137 PR Interval:    QRS Duration: 90 QT Interval:  289 QTC Calculation: 437 R Axis:   102 Text Interpretation: Junctional tachycardia Probable anterior infarct, old new Borderline ST depression, diffuse leads Confirmed by Blanchie Dessert [phone]) on 03/10/2020 7:04:08 AM   Radiology CT ABDOMEN PELVIS WO CONTRAST  Result Date: 03/10/2020 CLINICAL DATA:  Nausea, vomiting, constipation and fever for 4 days. History of kidney and liver transplant. EXAM: CT ABDOMEN AND PELVIS WITHOUT CONTRAST TECHNIQUE: Multidetector CT imaging of the abdomen and pelvis was performed following the standard protocol without IV contrast. COMPARISON:  CT abdomen dated 02/22/2007. FINDINGS: Lower chest: No acute abnormality. Hepatobiliary: No focal liver abnormality. No bile duct dilatation. Gallbladder not seen, presumed cholecystectomy. Pancreas: Unremarkable. No pancreatic ductal dilatation or surrounding inflammatory changes. Spleen: Stable splenomegaly. Adrenals/Urinary Tract: At least 2, probably 3 or 4, abutting stones within the mid LEFT ureter, L4-L5 vertebral body level, largest stone measuring 7 mm, causing moderate LEFT-sided hydronephrosis and associated perinephric edema. Benign-appearing cyst exophytic to the lower pole of the RIGHT kidney, measuring approximately 4 cm greatest dimension. No RIGHT-sided renal stone or hydronephrosis. No RIGHT-sided ureteral stone. Bladder is unremarkable, partially decompressed.  Transplant kidney within the RIGHT pelvis appears normal, with expected mild pelviectasis. Stomach/Bowel: No dilated large or small bowel loops. No evidence of bowel wall inflammation. Appendix is normal. Vascular/Lymphatic: No significant vascular findings are present. No enlarged abdominal or pelvic lymph nodes. Reproductive: Prostate is unremarkable. Other: No free fluid or abscess collection. No free  intraperitoneal air. Musculoskeletal: Degenerative spondylosis of the lumbar spine, mild to moderate in degree. No acute or suspicious osseous finding. Umbilical abdominal wall hernia which contains fat only. IMPRESSION: 1. At least 2, probably 3 or more, abutting stones within the mid LEFT ureter, largest stone measuring 7 mm, causing moderate LEFT-sided hydronephrosis and associated perinephric edema. 2. Transplant kidney within the RIGHT pelvis appears normal, with expected mild pelviectasis. 3. Stable splenomegaly. 4. Umbilical abdominal wall hernia which contains fat only. 5. Additional chronic/incidental findings detailed above. Electronically Signed   By: Franki Cabot M.D.   On: 03/10/2020 10:17   DG Chest Portable 1 View  Result Date: 03/10/2020 CLINICAL DATA:  Central line placement. EXAM: PORTABLE CHEST 1 VIEW COMPARISON:  Radiographs 03/10/2020 and 09/14/2015. FINDINGS: 1314 hours. Right IJ central venous catheter projects to the level of the upper SVC. The heart size and mediastinal contours are stable. There is no pneumothorax or significant pleural effusion. There are persistent low lung volumes with mild pulmonary interstitial prominence. No focal airspace disease or edema. The bones appear unchanged. Telemetry leads overlie the chest. IMPRESSION: Central venous catheter placement as described. No pneumothorax or other significant changes. Electronically Signed   By: Richardean Sale M.D.   On: 03/10/2020 13:49   DG Chest Port 1 View  Result Date: 03/10/2020 CLINICAL DATA:  Questionable sepsis EXAM: PORTABLE CHEST 1 VIEW COMPARISON:  09/14/2015 FINDINGS: Normal heart size and mediastinal contours. Prominent lung markings at the bases, but stable. There is no edema, consolidation, effusion, or pneumothorax. IMPRESSION: Stable exam.  No acute finding. Electronically Signed   By: Monte Fantasia M.D.   On: 03/10/2020 08:03    Procedures Procedures (including critical care time)  CRITICAL  CARE Performed by: Rodney Booze   Total critical care time: 45 minutes  Critical care time was exclusive of separately billable procedures and treating other patients.  Critical care was necessary to treat or prevent imminent or life-threatening deterioration.  Critical care was time spent personally by me on the following activities: development of treatment plan with patient and/or surrogate as well as nursing, discussions with consultants, evaluation of patient's response to treatment, examination of patient, obtaining history from patient or surrogate, ordering and performing treatments and interventions, ordering and review of laboratory studies, ordering and review of radiographic studies, pulse oximetry and re-evaluation of patient's condition.   Medications Ordered in ED Medications  lactated ringers infusion ( Intravenous Not Given 03/10/20 1125)  sodium chloride 0.9 % bolus 1,000 mL (0 mLs Intravenous Stopped 03/10/20 0946)    And  sodium chloride 0.9 % bolus 1,000 mL (0 mLs Intravenous Stopped 03/10/20 1026)    And  sodium chloride 0.9 % bolus 1,000 mL (1,000 mLs Intravenous Not Given 03/10/20 1200)    And  sodium chloride 0.9 % bolus 500 mL (500 mLs Intravenous Not Given 03/10/20 1159)  lactated ringers infusion ( Intravenous New Bag/Given 03/10/20 1029)  vancomycin (VANCOCIN) IVPB 1000 mg/200 mL premix (has no administration in time range)  ceFEPIme (MAXIPIME) 2 g in sodium chloride 0.9 % 100 mL IVPB (has no administration in time range)  norepinephrine (LEVOPHED) 4mg  in 229mL premix infusion (12  mcg/min Intravenous Rate/Dose Change 03/10/20 1202)  enoxaparin (LOVENOX) injection 40 mg (has no administration in time range)  hydrocortisone sodium succinate (SOLU-CORTEF) 100 MG injection 50 mg (has no administration in time range)  acetaminophen (TYLENOL) tablet 1,000 mg (has no administration in time range)  ceFEPIme (MAXIPIME) 2 g in sodium chloride 0.9 % 100 mL IVPB (0 g Intravenous  Stopped 03/10/20 0856)  metroNIDAZOLE (FLAGYL) IVPB 500 mg (0 mg Intravenous Stopped 03/10/20 1026)  acetaminophen (TYLENOL) suppository 650 mg (650 mg Rectal Given 03/10/20 0838)  vancomycin (VANCOREADY) IVPB 2000 mg/400 mL (0 mg Intravenous Stopped 03/10/20 1252)  hydrocortisone sodium succinate (SOLU-CORTEF) 100 MG injection 100 mg (100 mg Intravenous Given 03/10/20 0901)  ondansetron (ZOFRAN) 4 MG/2ML injection (4 mg  Given 03/10/20 0901)    ED Course  I have reviewed the triage vital signs and the nursing notes.  Pertinent labs & imaging results that were available during my care of the patient were reviewed by me and considered in my medical decision making (see chart for details).    MDM Rules/Calculators/A&P                          69 year old male with history of kidney/liver transplant presenting for evaluation of fever, nausea vomiting and cough.  On arrival patient found to be hypotensive, tachycardic with a temp of 105 rectally.  Code sepsis was initiated, 30 cc/kg fluid bolus was initiated as well as broad-spectrum IV antibiotics.  Patient was also given a stress dose of steroids  Reviewed/interpreted labs CBC with leukopenia at 3.9, no anemia, thrombocytopenia present but improved from prior CMP with a low bicarb at 18, elevated BUN/creatinine at 32/2.5 LFTs are normal  -Reviewed prior records per care everywhere and it appears patient's post transplant baseline kidney function is between 1.6-1.7 recently. Lipase wnl Trop elevated at 46 - suspect due to demand Coags are normal COVID positive UA grossly positive. Culture added  VBG with no acidemia Blood cultures obtained   EKG with Junctional tachycardia Probable anterior infarct, old new Borderline ST depression, diffuse leads   All imaging reviewed/interpreted CXR - no focal infiltrate or other acute abnormality CT ABD/PELVIS -  1. At least 2, probably 3 or more, abutting stones within the mid LEFT ureter, largest stone  measuring 7 mm, causing moderate LEFT-sided hydronephrosis and associated perinephric edema. 2. Transplant kidney within the RIGHT pelvis appears normal, with expected mild pelviectasis. 3. Stable splenomegaly. 4. Umbilical abdominal wall hernia which contains fat only. 5  - Reviewed records per care everywhere.  Patient had CT abdomen/pelvis on 11/19/2019 which did show 0.7 cm left nonobstructing renal stone. No ureteral stones noted.  - suspect stones are infected given concurrent UTI  10:46 AM CONSULT with Dr. Junious Silk with urology who will evaluate the patient.  Recommends making the patient n.p.o.  He will likely need to be stented.  He will take pt to the OR at 3  After multiple rechecks pt remains persistently hypotensive after receiving a total of 4L IVF. He is feeling a bit better and his HR has improved somewhat but at this point his BP is not responding and he will require pressors.  11:15 PM CONSULT With Shirlee Limerick with critical care who will evaluate the patient.   1:20 PM Critical care at bedside.   Final Clinical Impression(s) / ED Diagnoses Final diagnoses:  Ureteral stone with hydronephrosis  COVID-19    Rx / DC Orders ED Discharge Orders  None       Bishop Dublin 03/10/20 1420    Blanchie Dessert, MD 03/10/20 [phone]

## 2020-03-10 NOTE — Anesthesia Postprocedure Evaluation (Signed)
Anesthesia Post Note  Patient: [name]  Procedure(s) Performed: CYSTOSCOPY WITH RETROGRADE AND LEFT STENT PLACEMENT (Left )     Patient location during evaluation: PACU Anesthesia Type: General Level of consciousness: awake and alert Pain management: pain level controlled Vital Signs Assessment: post-procedure vital signs reviewed and stable Respiratory status: spontaneous breathing, nonlabored ventilation, respiratory function stable and patient connected to nasal cannula oxygen Cardiovascular status: blood pressure returned to baseline and stable Postop Assessment: no apparent nausea or vomiting Anesthetic complications: no   No complications documented.  Last Vitals:  Vitals:   03/10/20 1720 03/10/20 1800  BP: 108/68   Pulse: 90   Resp: (!) 22   Temp: 37.8 C 37.7 C  SpO2: 100%     Last Pain:  Vitals:   03/10/20 1800  TempSrc: Axillary  PainSc:                  Effie Berkshire

## 2020-03-10 NOTE — Progress Notes (Signed)
PHARMACY - PHYSICIAN COMMUNICATION CRITICAL VALUE ALERT - BLOOD CULTURE IDENTIFICATION (BCID)  [name] is an 69 y.o. male who presented to [facility] on 03/10/2020 with a chief complaint of sepsis  Assessment:  Sepsis from likely urinary source  Name of physician (or Provider) Contacted: Ogan  Current antibiotics: cefepime and daptomycin  Changes to prescribed antibiotics recommended:  Change to rocephin.  MD is not comfortable changing at this time  Results for orders placed or performed during the hospital encounter of 03/10/20  Blood Culture ID Panel (Reflexed) (Collected: 03/10/2020  2:28 PM)  Result Value Ref Range   Enterococcus faecalis NOT DETECTED NOT DETECTED   Enterococcus Faecium NOT DETECTED NOT DETECTED   Listeria monocytogenes NOT DETECTED NOT DETECTED   Staphylococcus species NOT DETECTED NOT DETECTED   Staphylococcus aureus (BCID) NOT DETECTED NOT DETECTED   Staphylococcus epidermidis NOT DETECTED NOT DETECTED   Staphylococcus lugdunensis NOT DETECTED NOT DETECTED   Streptococcus species NOT DETECTED NOT DETECTED   Streptococcus agalactiae NOT DETECTED NOT DETECTED   Streptococcus pneumoniae NOT DETECTED NOT DETECTED   Streptococcus pyogenes NOT DETECTED NOT DETECTED   A.calcoaceticus-baumannii NOT DETECTED NOT DETECTED   Bacteroides fragilis NOT DETECTED NOT DETECTED   Enterobacterales DETECTED (A) NOT DETECTED   Enterobacter cloacae complex NOT DETECTED NOT DETECTED   Escherichia coli DETECTED (A) NOT DETECTED   Klebsiella aerogenes NOT DETECTED NOT DETECTED   Klebsiella oxytoca NOT DETECTED NOT DETECTED   Klebsiella pneumoniae NOT DETECTED NOT DETECTED   Proteus species NOT DETECTED NOT DETECTED   Salmonella species NOT DETECTED NOT DETECTED   Serratia marcescens NOT DETECTED NOT DETECTED   Haemophilus influenzae NOT DETECTED NOT DETECTED   Neisseria meningitidis NOT DETECTED NOT DETECTED   Pseudomonas aeruginosa NOT DETECTED NOT DETECTED    Stenotrophomonas maltophilia NOT DETECTED NOT DETECTED   Candida albicans NOT DETECTED NOT DETECTED   Candida auris NOT DETECTED NOT DETECTED   Candida glabrata NOT DETECTED NOT DETECTED   Candida krusei NOT DETECTED NOT DETECTED   Candida parapsilosis NOT DETECTED NOT DETECTED   Candida tropicalis NOT DETECTED NOT DETECTED   Cryptococcus neoformans/gattii NOT DETECTED NOT DETECTED   CTX-M ESBL NOT DETECTED NOT DETECTED   Carbapenem resistance IMP NOT DETECTED NOT DETECTED   Carbapenem resistance KPC NOT DETECTED NOT DETECTED   Carbapenem resistance NDM NOT DETECTED NOT DETECTED   Carbapenem resist OXA 48 LIKE NOT DETECTED NOT DETECTED   Carbapenem resistance VIM NOT DETECTED NOT DETECTED    Beverlee Nims 03/10/2020  11:52 PM

## 2020-03-10 NOTE — Consult Note (Addendum)
Consult: left ureteral stone, sepsis Requested by: Blanchie Dessert, MD   H&P  Chief Complaint: Left ureteral stone and sepsis  History of Present Illness: [name] is a 69 year old male status post liver and kidney transplant.  He presented with fever and abdominal pain.  CT scan of the abdomen and pelvis revealed a 7 mm left proximal ureteral stone and hydronephrosis of his native kidney.  Past CT scan report at [facility] in May 2021 indicates there was a 7 mm stone in the collecting system and no hydronephrosis.  Patient on pressors.  He is also Covid positive but per PA Plunkett has tested positive and negative for Covid recently per wife.  Symptoms today are different.  Urinalysis also consistent with infection at few bacteria, greater than 50 white cells, greater than 50 red cells, moderate leukocytes, and positive nitrite.  Past Medical History:  Diagnosis Date  . Abscess in epidural space of cervical spine 08/13/2015  . Acute hepatic encephalopathy   . Acute renal failure superimposed on stage 3 chronic kidney disease ([place])   . Anemia of chronic disease   . Ascites   . Bilateral carpal tunnel syndrome 01/27/2018  . Bleeding esophageal varices ([place])   . Coagulopathy ([place])   . Depression   . Diabetes mellitus type II, controlled ([place])   . Diabetic neuropathy ([place])   . Esophageal stricture   . NASH (nonalcoholic steatohepatitis)   . Protein-calorie malnutrition, severe ([place])   . Secondary biliary cirrhosis ([place])   . Thrombocytopenia ([place])    Past Surgical History:  Procedure Laterality Date  . COLONOSCOPY    . ESOPHAGEAL BANDING Bilateral 11/29/2013   Procedure: ESOPHAGEAL BANDING;  Surgeon: Irene Shipper, MD;  Location: [facility] ENDOSCOPY;  Service: Endoscopy;  Laterality: Bilateral;  . ESOPHAGEAL BANDING N/A 06/13/2014   Procedure: ESOPHAGEAL BANDING;  Surgeon: Irene Shipper, MD;  Location: [facility] ENDOSCOPY;  Service: Endoscopy;  Laterality: N/A;  . ESOPHAGOGASTRODUODENOSCOPY  08/01/2011   Procedure:  ESOPHAGOGASTRODUODENOSCOPY (EGD);  Surgeon: Scarlette Shorts, MD;  Location: [facility];  Service: Endoscopy;  Laterality: N/A;  . ESOPHAGOGASTRODUODENOSCOPY N/A 10/27/2012   Procedure: ESOPHAGOGASTRODUODENOSCOPY (EGD);  Surgeon: Inda Castle, MD;  Location: [facility];  Service: Endoscopy;  Laterality: N/A;  . ESOPHAGOGASTRODUODENOSCOPY N/A 11/22/2012   Procedure: ESOPHAGOGASTRODUODENOSCOPY (EGD);  Surgeon: Irene Shipper, MD;  Location: [facility];  Service: Endoscopy;  Laterality: N/A;  . ESOPHAGOGASTRODUODENOSCOPY N/A 11/29/2013   Procedure: ESOPHAGOGASTRODUODENOSCOPY (EGD);  Surgeon: Irene Shipper, MD;  Location: [facility];  Service: Endoscopy;  Laterality: N/A;  . ESOPHAGOGASTRODUODENOSCOPY N/A 06/13/2014   Procedure: ESOPHAGOGASTRODUODENOSCOPY (EGD);  Surgeon: Irene Shipper, MD;  Location: [facility];  Service: Endoscopy;  Laterality: N/A;  . KIDNEY STONE SURGERY  yrs ago  . 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 UPPER GASTROINTESTINAL ENDOSCOPY      Home Medications:  (Not in a hospital admission)  Allergies: No Known Allergies  Family History  Problem Relation Age of Onset  . Diabetes Father   . Heart disease Father   . Aneurysm Mother   . Heart disease Other        neice  . Diabetes Unknown        niece  . Colon cancer Neg Hx   . Anesthesia problems Neg Hx   . Hypotension Neg Hx   . Malignant hyperthermia Neg Hx   . Pseudochol deficiency Neg Hx    Social History:  reports that he has never smoked. He has never used smokeless tobacco. He reports that he  does not drink alcohol and does not use drugs.  ROS: A complete review of systems was performed.  All systems are negative except for pertinent findings as noted. Review of Systems  Gastrointestinal: Positive for constipation.  All other systems reviewed and are negative.    Physical Exam:  Vital signs in last 24 hours: Temp:  [102 F (38.9 C)-105 F (40.6 C)] 102 F (38.9 C) (09/04 1159) Pulse Rate:  [96-143] 103  (09/04 1345) Resp:  [14-35] 25 (09/04 1345) BP: (73-100)/(51-83) 98/64 (09/04 1345) SpO2:  [93 %-100 %] 100 % (09/04 1345) Weight:  [102.5 kg] 102.5 kg (09/04 0730) General:  Alert and oriented, No acute distress HEENT: Normocephalic, atraumatic Cardiovascular: Regular rate and rhythm Lungs: Regular rate and effort, he is coughing some Abdomen: Soft, nontender, nondistended, no abdominal masses Back: No CVA tenderness Extremities: No edema Neurologic: Grossly intact  Laboratory Data:  Results for orders placed or performed during the hospital encounter of 03/10/20 (from the past 24 hour(s))  SARS Coronavirus 2 by RT PCR (hospital order, performed in [place] hospital lab) Nasopharyngeal Nasopharyngeal Swab     Status: Abnormal   Collection Time: 03/10/20  7:40 AM   Specimen: Nasopharyngeal Swab  Result Value Ref Range   SARS Coronavirus 2 POSITIVE (A) NEGATIVE  Lactic acid, plasma     Status: Abnormal   Collection Time: 03/10/20  8:01 AM  Result Value Ref Range   Lactic Acid, Venous 3.2 (HH) 0.5 - 1.9 mmol/L  Ammonia     Status: Abnormal   Collection Time: 03/10/20  8:01 AM  Result Value Ref Range   Ammonia 48 (H) 9 - 35 umol/L  Lipase, blood     Status: None   Collection Time: 03/10/20  8:01 AM  Result Value Ref Range   Lipase 34 11 - 51 U/L  Troponin I (High Sensitivity)     Status: Abnormal   Collection Time: 03/10/20  8:01 AM  Result Value Ref Range   Troponin I (High Sensitivity) 46 (H) <18 ng/L  Comprehensive metabolic panel     Status: Abnormal   Collection Time: 03/10/20  8:15 AM  Result Value Ref Range   Sodium 139 135 - 145 mmol/L   Potassium 4.0 3.5 - 5.1 mmol/L   Chloride 108 98 - 111 mmol/L   CO2 18 (L) 22 - 32 mmol/L   Glucose, Bld 109 (H) 70 - 99 mg/dL   BUN 32 (H) 8 - 23 mg/dL   Creatinine, Ser 2.50 (H) 0.61 - 1.24 mg/dL   Calcium 8.8 (L) 8.9 - 10.3 mg/dL   Total Protein 6.3 (L) 6.5 - 8.1 g/dL   Albumin 3.1 (L) 3.5 - 5.0 g/dL   AST 31 15 - 41 U/L    ALT 19 0 - 44 U/L   Alkaline Phosphatase 82 38 - 126 U/L   Total Bilirubin 1.0 0.3 - 1.2 mg/dL   GFR calc non Af Amer 26 (L) >60 mL/min   GFR calc Af Amer 30 (L) >60 mL/min   Anion gap 13 5 - 15  CBC WITH DIFFERENTIAL     Status: Abnormal   Collection Time: 03/10/20  8:15 AM  Result Value Ref Range   WBC 3.9 (L) 4.0 - 10.5 K/uL   RBC 6.02 (H) 4.22 - 5.81 MIL/uL   Hemoglobin 13.9 13.0 - 17.0 g/dL   HCT 45.1 39 - 52 %   MCV 74.9 (L) 80.0 - 100.0 fL   MCH 23.1 (L) 26.0 - 34.0  pg   MCHC 30.8 30.0 - 36.0 g/dL   RDW 16.6 (H) 11.5 - 15.5 %   Platelets 95 (L) 150 - 400 K/uL   nRBC 0.0 0.0 - 0.2 %   Neutrophils Relative % 94 %   Neutro Abs 3.7 1.7 - 7.7 K/uL   Lymphocytes Relative 4 %   Lymphs Abs 0.2 (L) 0.7 - 4.0 K/uL   Monocytes Relative 1 %   Monocytes Absolute 0.0 (L) 0 - 1 K/uL   Eosinophils Relative 0 %   Eosinophils Absolute 0.0 0 - 0 K/uL   Basophils Relative 0 %   Basophils Absolute 0.0 0 - 0 K/uL   Immature Granulocytes 1 %   Abs Immature Granulocytes 0.02 0.00 - 0.07 K/uL  Protime-INR     Status: None   Collection Time: 03/10/20  8:15 AM  Result Value Ref Range   Prothrombin Time 14.3 11.4 - 15.2 seconds   INR 1.2 0.8 - 1.2  APTT     Status: None   Collection Time: 03/10/20  8:15 AM  Result Value Ref Range   aPTT 28 24 - 36 seconds  Lactic acid, plasma     Status: Abnormal   Collection Time: 03/10/20 10:47 AM  Result Value Ref Range   Lactic Acid, Venous 3.7 (HH) 0.5 - 1.9 mmol/L  Troponin I (High Sensitivity)     Status: Abnormal   Collection Time: 03/10/20 10:47 AM  Result Value Ref Range   Troponin I (High Sensitivity) 90 (H) <18 ng/L  I-Stat venous blood gas, ([place] [place] [facility])     Status: Abnormal   Collection Time: 03/10/20 10:58 AM  Result Value Ref Range   pH, Ven 7.398 7.25 - 7.43   pCO2, Ven 27.6 (L) 44 - 60 mmHg   pO2, Ven 66.0 (H) 32 - 45 mmHg   Bicarbonate 17.0 (L) 20.0 - 28.0 mmol/L   TCO2 18 (L) 22 - 32 mmol/L   O2 Saturation 93.0 %   Acid-base  deficit 6.0 (H) 0.0 - 2.0 mmol/L   Sodium 145 135 - 145 mmol/L   Potassium 3.2 (L) 3.5 - 5.1 mmol/L   Calcium, Ion 1.11 (L) 1.15 - 1.40 mmol/L   HCT 36.0 (L) 39 - 52 %   Hemoglobin 12.2 (L) 13.0 - 17.0 g/dL   Sample type VENOUS   Urinalysis, Routine w reflex microscopic Urine, Catheterized     Status: Abnormal   Collection Time: 03/10/20 11:20 AM  Result Value Ref Range   Color, Urine AMBER (A) YELLOW   APPearance TURBID (A) CLEAR   Specific Gravity, Urine 1.014 1.005 - 1.030   pH 5.0 5.0 - 8.0   Glucose, UA NEGATIVE NEGATIVE mg/dL   Hgb urine dipstick LARGE (A) NEGATIVE   Bilirubin Urine NEGATIVE NEGATIVE   Ketones, ur NEGATIVE NEGATIVE mg/dL   Protein, ur 100 (A) NEGATIVE mg/dL   Nitrite POSITIVE (A) NEGATIVE   Leukocytes,Ua MODERATE (A) NEGATIVE   RBC / HPF >50 (H) 0 - 5 RBC/hpf   WBC, UA >50 (H) 0 - 5 WBC/hpf   Bacteria, UA FEW (A) NONE SEEN   WBC Clumps PRESENT    Non Squamous Epithelial 0-5 (A) NONE SEEN   Recent Results (from the past 240 hour(s))  SARS Coronavirus 2 by RT PCR (hospital order, performed in [place] hospital lab) Nasopharyngeal Nasopharyngeal Swab     Status: Abnormal   Collection Time: 03/10/20  7:40 AM   Specimen: Nasopharyngeal Swab  Result Value Ref Range Status  SARS Coronavirus 2 POSITIVE (A) NEGATIVE Final    Comment: RESULT CALLED TO, READ BACK BY AND VERIFIED WITH: A,MCKOWN RN @1149  03/10/20 EB (NOTE) SARS-CoV-2 target nucleic acids are DETECTED  SARS-CoV-2 RNA is generally detectable in upper respiratory specimens  during the acute phase of infection.  Positive results are indicative  of the presence of the identified virus, but do not rule out bacterial infection or co-infection with other pathogens not detected by the test.  Clinical correlation with patient history and  other diagnostic information is necessary to determine patient infection status.  The expected result is negative.  Fact Sheet for Patients:    StrictlyIdeas.no   Fact Sheet for Healthcare Providers:   BankingDealers.co.za    This test is not yet approved or cleared by the [place] FDA and  has been authorized for detection and/or diagnosis of SARS-CoV-2 by FDA under an Emergency Use Authorization (EUA).  This EUA will remain in effect (meaning this test can  be used) for the duration of  the COVID-19 declaration under Section 564(b)(1) of the Act, 21 U.S.C. section 360-bbb-3(b)(1), unless the authorization is terminated or revoked sooner.  Performed at [facility], [place] [address] [place], [place] [address]    Creatinine: Recent Labs    03/10/20 0815  CREATININE 2.50*    Impression/Assessment/plan:  I discussed the CT findings with the patient and the nature risk benefits and alternatives to cystoscopy with left retrograde pyelogram and left ureteral stent placement of his native ureter and kidney.  He is requiring pressors.  I told him we could not tell for sure if this was more Covid related or stone/UTI related, but urgent stent is indicated. We discussed he will need follow-up ureteroscopy with laser lithotripsy and the temporary nature of ureteral stents.  I considered a nephrostomy tube as well and discussed it, but he does not have significant hydronephrosis and nephrostomy tube might not be straightforward either.  All questions answered.  He elects to proceed.  Festus Aloe 03/10/2020, 3:10 PM

## 2020-03-10 NOTE — Progress Notes (Signed)
eLink Physician-Brief Progress Note Patient Name: [name] DOB: 1950/11/30 MRN: [id number]   Date of Service  03/10/2020  HPI/Events of Note  Patient admitted with sepsis presumed of urinary tract origin, blood cultures are pending, MRSA screen is positive and patient received a dose of Vancomycin,.  eICU Interventions  Vancomycin discontinued and Daptomycin substituted due to patient's history of renal transplantation and the elevated creatinine of the transplanted kidney. Nephrology will be consulted , to see patient in a.m.        [name] 03/10/2020, 10:12 PM

## 2020-03-10 NOTE — Anesthesia Preprocedure Evaluation (Addendum)
Anesthesia Evaluation  Patient identified by MRN, date of birth, ID band Patient awake    Reviewed: Allergy & Precautions, NPO status , Patient's Chart, lab work & pertinent test results  Airway Mallampati: III  TM Distance: >3 FB Neck ROM: Full    Dental  (+) Teeth Intact, Dental Advisory Given   Pulmonary neg pulmonary ROS,    breath sounds clear to auscultation       Cardiovascular negative cardio ROS   Rhythm:Regular Rate:Normal     Neuro/Psych PSYCHIATRIC DISORDERS Depression    GI/Hepatic negative GI ROS, (+) Cirrhosis       , Hepatitis -, UnspecifiedS/p Liver transplant   Endo/Other  diabetes  Renal/GU Renal InsufficiencyRenal disease     Musculoskeletal  (+) Arthritis , Osteoarthritis,    Abdominal (+) + obese,   Peds  Hematology   Anesthesia Other Findings   Reproductive/Obstetrics                            Anesthesia Physical Anesthesia Plan  ASA: III  Anesthesia Plan: General   Post-op Pain Management:    Induction: Intravenous, Rapid sequence and Cricoid pressure planned  PONV Risk Score and Plan: 3 and Ondansetron, Midazolam, Dexamethasone and Treatment may vary due to age or medical condition  Airway Management Planned: Oral ETT and Video Laryngoscope Planned  Additional Equipment: None  Intra-op Plan:   Post-operative Plan: Extubation in OR  Informed Consent: I have reviewed the patients History and Physical, chart, labs and discussed the procedure including the risks, benefits and alternatives for the proposed anesthesia with the patient or authorized representative who has indicated his/her understanding and acceptance.     Dental advisory given  Plan Discussed with: CRNA  Anesthesia Plan Comments:        Anesthesia Quick Evaluation

## 2020-03-10 NOTE — Progress Notes (Signed)
Sepsis protocol complete. Though MAR does not reflect, have confirmed w/ MD and RN that 4000 ml of fluid resusitation given before time up at 1327.

## 2020-03-10 NOTE — ED Notes (Signed)
Triple lumen central placed by Dr. Maryan Rued

## 2020-03-10 NOTE — H&P (View-Only) (Signed)
Consult: left ureteral stone, sepsis Requested by: Blanchie Dessert, MD   H&P  Chief Complaint: Left ureteral stone and sepsis  History of Present Illness: [name] is a 69 year old male status post liver and kidney transplant.  He presented with fever and abdominal pain.  CT scan of the abdomen and pelvis revealed a 7 mm left proximal ureteral stone and hydronephrosis of his native kidney.  Past CT scan report at [facility] in May 2021 indicates there was a 7 mm stone in the collecting system and no hydronephrosis.  Patient on pressors.  He is also Covid positive but per PA Plunkett has tested positive and negative for Covid recently per wife.  Symptoms today are different.  Urinalysis also consistent with infection at few bacteria, greater than 50 white cells, greater than 50 red cells, moderate leukocytes, and positive nitrite.  Past Medical History:  Diagnosis Date  . Abscess in epidural space of cervical spine 08/13/2015  . Acute hepatic encephalopathy   . Acute renal failure superimposed on stage 3 chronic kidney disease ([place])   . Anemia of chronic disease   . Ascites   . Bilateral carpal tunnel syndrome 01/27/2018  . Bleeding esophageal varices ([place])   . Coagulopathy ([place])   . Depression   . Diabetes mellitus type II, controlled ([place])   . Diabetic neuropathy ([place])   . Esophageal stricture   . NASH (nonalcoholic steatohepatitis)   . Protein-calorie malnutrition, severe ([place])   . Secondary biliary cirrhosis ([place])   . Thrombocytopenia ([place])    Past Surgical History:  Procedure Laterality Date  . COLONOSCOPY    . ESOPHAGEAL BANDING Bilateral 11/29/2013   Procedure: ESOPHAGEAL BANDING;  Surgeon: Irene Shipper, MD;  Location: [facility] ENDOSCOPY;  Service: Endoscopy;  Laterality: Bilateral;  . ESOPHAGEAL BANDING N/A 06/13/2014   Procedure: ESOPHAGEAL BANDING;  Surgeon: Irene Shipper, MD;  Location: [facility] ENDOSCOPY;  Service: Endoscopy;  Laterality: N/A;  . ESOPHAGOGASTRODUODENOSCOPY  08/01/2011   Procedure:  ESOPHAGOGASTRODUODENOSCOPY (EGD);  Surgeon: Scarlette Shorts, MD;  Location: [facility];  Service: Endoscopy;  Laterality: N/A;  . ESOPHAGOGASTRODUODENOSCOPY N/A 10/27/2012   Procedure: ESOPHAGOGASTRODUODENOSCOPY (EGD);  Surgeon: Inda Castle, MD;  Location: [facility];  Service: Endoscopy;  Laterality: N/A;  . ESOPHAGOGASTRODUODENOSCOPY N/A 11/22/2012   Procedure: ESOPHAGOGASTRODUODENOSCOPY (EGD);  Surgeon: Irene Shipper, MD;  Location: [facility];  Service: Endoscopy;  Laterality: N/A;  . ESOPHAGOGASTRODUODENOSCOPY N/A 11/29/2013   Procedure: ESOPHAGOGASTRODUODENOSCOPY (EGD);  Surgeon: Irene Shipper, MD;  Location: [facility];  Service: Endoscopy;  Laterality: N/A;  . ESOPHAGOGASTRODUODENOSCOPY N/A 06/13/2014   Procedure: ESOPHAGOGASTRODUODENOSCOPY (EGD);  Surgeon: Irene Shipper, MD;  Location: [facility];  Service: Endoscopy;  Laterality: N/A;  . KIDNEY STONE SURGERY  yrs ago  . 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 UPPER GASTROINTESTINAL ENDOSCOPY      Home Medications:  (Not in a hospital admission)  Allergies: No Known Allergies  Family History  Problem Relation Age of Onset  . Diabetes Father   . Heart disease Father   . Aneurysm Mother   . Heart disease Other        neice  . Diabetes Unknown        niece  . Colon cancer Neg Hx   . Anesthesia problems Neg Hx   . Hypotension Neg Hx   . Malignant hyperthermia Neg Hx   . Pseudochol deficiency Neg Hx    Social History:  reports that he has never smoked. He has never used smokeless tobacco. He reports that he  does not drink alcohol and does not use drugs.  ROS: A complete review of systems was performed.  All systems are negative except for pertinent findings as noted. Review of Systems  Gastrointestinal: Positive for constipation.  All other systems reviewed and are negative.    Physical Exam:  Vital signs in last 24 hours: Temp:  [102 F (38.9 C)-105 F (40.6 C)] 102 F (38.9 C) (09/04 1159) Pulse Rate:  [96-143] 103  (09/04 1345) Resp:  [14-35] 25 (09/04 1345) BP: (73-100)/(51-83) 98/64 (09/04 1345) SpO2:  [93 %-100 %] 100 % (09/04 1345) Weight:  [102.5 kg] 102.5 kg (09/04 0730) General:  Alert and oriented, No acute distress HEENT: Normocephalic, atraumatic Cardiovascular: Regular rate and rhythm Lungs: Regular rate and effort, he is coughing some Abdomen: Soft, nontender, nondistended, no abdominal masses Back: No CVA tenderness Extremities: No edema Neurologic: Grossly intact  Laboratory Data:  Results for orders placed or performed during the hospital encounter of 03/10/20 (from the past 24 hour(s))  SARS Coronavirus 2 by RT PCR (hospital order, performed in [place] hospital lab) Nasopharyngeal Nasopharyngeal Swab     Status: Abnormal   Collection Time: 03/10/20  7:40 AM   Specimen: Nasopharyngeal Swab  Result Value Ref Range   SARS Coronavirus 2 POSITIVE (A) NEGATIVE  Lactic acid, plasma     Status: Abnormal   Collection Time: 03/10/20  8:01 AM  Result Value Ref Range   Lactic Acid, Venous 3.2 (HH) 0.5 - 1.9 mmol/L  Ammonia     Status: Abnormal   Collection Time: 03/10/20  8:01 AM  Result Value Ref Range   Ammonia 48 (H) 9 - 35 umol/L  Lipase, blood     Status: None   Collection Time: 03/10/20  8:01 AM  Result Value Ref Range   Lipase 34 11 - 51 U/L  Troponin I (High Sensitivity)     Status: Abnormal   Collection Time: 03/10/20  8:01 AM  Result Value Ref Range   Troponin I (High Sensitivity) 46 (H) <18 ng/L  Comprehensive metabolic panel     Status: Abnormal   Collection Time: 03/10/20  8:15 AM  Result Value Ref Range   Sodium 139 135 - 145 mmol/L   Potassium 4.0 3.5 - 5.1 mmol/L   Chloride 108 98 - 111 mmol/L   CO2 18 (L) 22 - 32 mmol/L   Glucose, Bld 109 (H) 70 - 99 mg/dL   BUN 32 (H) 8 - 23 mg/dL   Creatinine, Ser 2.50 (H) 0.61 - 1.24 mg/dL   Calcium 8.8 (L) 8.9 - 10.3 mg/dL   Total Protein 6.3 (L) 6.5 - 8.1 g/dL   Albumin 3.1 (L) 3.5 - 5.0 g/dL   AST 31 15 - 41 U/L    ALT 19 0 - 44 U/L   Alkaline Phosphatase 82 38 - 126 U/L   Total Bilirubin 1.0 0.3 - 1.2 mg/dL   GFR calc non Af Amer 26 (L) >60 mL/min   GFR calc Af Amer 30 (L) >60 mL/min   Anion gap 13 5 - 15  CBC WITH DIFFERENTIAL     Status: Abnormal   Collection Time: 03/10/20  8:15 AM  Result Value Ref Range   WBC 3.9 (L) 4.0 - 10.5 K/uL   RBC 6.02 (H) 4.22 - 5.81 MIL/uL   Hemoglobin 13.9 13.0 - 17.0 g/dL   HCT 45.1 39 - 52 %   MCV 74.9 (L) 80.0 - 100.0 fL   MCH 23.1 (L) 26.0 - 34.0  pg   MCHC 30.8 30.0 - 36.0 g/dL   RDW 16.6 (H) 11.5 - 15.5 %   Platelets 95 (L) 150 - 400 K/uL   nRBC 0.0 0.0 - 0.2 %   Neutrophils Relative % 94 %   Neutro Abs 3.7 1.7 - 7.7 K/uL   Lymphocytes Relative 4 %   Lymphs Abs 0.2 (L) 0.7 - 4.0 K/uL   Monocytes Relative 1 %   Monocytes Absolute 0.0 (L) 0 - 1 K/uL   Eosinophils Relative 0 %   Eosinophils Absolute 0.0 0 - 0 K/uL   Basophils Relative 0 %   Basophils Absolute 0.0 0 - 0 K/uL   Immature Granulocytes 1 %   Abs Immature Granulocytes 0.02 0.00 - 0.07 K/uL  Protime-INR     Status: None   Collection Time: 03/10/20  8:15 AM  Result Value Ref Range   Prothrombin Time 14.3 11.4 - 15.2 seconds   INR 1.2 0.8 - 1.2  APTT     Status: None   Collection Time: 03/10/20  8:15 AM  Result Value Ref Range   aPTT 28 24 - 36 seconds  Lactic acid, plasma     Status: Abnormal   Collection Time: 03/10/20 10:47 AM  Result Value Ref Range   Lactic Acid, Venous 3.7 (HH) 0.5 - 1.9 mmol/L  Troponin I (High Sensitivity)     Status: Abnormal   Collection Time: 03/10/20 10:47 AM  Result Value Ref Range   Troponin I (High Sensitivity) 90 (H) <18 ng/L  I-Stat venous blood gas, ([facility])     Status: Abnormal   Collection Time: 03/10/20 10:58 AM  Result Value Ref Range   pH, Ven 7.398 7.25 - 7.43   pCO2, Ven 27.6 (L) 44 - 60 mmHg   pO2, Ven 66.0 (H) 32 - 45 mmHg   Bicarbonate 17.0 (L) 20.0 - 28.0 mmol/L   TCO2 18 (L) 22 - 32 mmol/L   O2 Saturation 93.0 %   Acid-base  deficit 6.0 (H) 0.0 - 2.0 mmol/L   Sodium 145 135 - 145 mmol/L   Potassium 3.2 (L) 3.5 - 5.1 mmol/L   Calcium, Ion 1.11 (L) 1.15 - 1.40 mmol/L   HCT 36.0 (L) 39 - 52 %   Hemoglobin 12.2 (L) 13.0 - 17.0 g/dL   Sample type VENOUS   Urinalysis, Routine w reflex microscopic Urine, Catheterized     Status: Abnormal   Collection Time: 03/10/20 11:20 AM  Result Value Ref Range   Color, Urine AMBER (A) YELLOW   APPearance TURBID (A) CLEAR   Specific Gravity, Urine 1.014 1.005 - 1.030   pH 5.0 5.0 - 8.0   Glucose, UA NEGATIVE NEGATIVE mg/dL   Hgb urine dipstick LARGE (A) NEGATIVE   Bilirubin Urine NEGATIVE NEGATIVE   Ketones, ur NEGATIVE NEGATIVE mg/dL   Protein, ur 100 (A) NEGATIVE mg/dL   Nitrite POSITIVE (A) NEGATIVE   Leukocytes,Ua MODERATE (A) NEGATIVE   RBC / HPF >50 (H) 0 - 5 RBC/hpf   WBC, UA >50 (H) 0 - 5 WBC/hpf   Bacteria, UA FEW (A) NONE SEEN   WBC Clumps PRESENT    Non Squamous Epithelial 0-5 (A) NONE SEEN   Recent Results (from the past 240 hour(s))  SARS Coronavirus 2 by RT PCR (hospital order, performed in [place] hospital lab) Nasopharyngeal Nasopharyngeal Swab     Status: Abnormal   Collection Time: 03/10/20  7:40 AM   Specimen: Nasopharyngeal Swab  Result Value Ref Range Status  SARS Coronavirus 2 POSITIVE (A) NEGATIVE Final    Comment: RESULT CALLED TO, READ BACK BY AND VERIFIED WITH: A,MCKOWN RN @1149  03/10/20 EB (NOTE) SARS-CoV-2 target nucleic acids are DETECTED  SARS-CoV-2 RNA is generally detectable in upper respiratory specimens  during the acute phase of infection.  Positive results are indicative  of the presence of the identified virus, but do not rule out bacterial infection or co-infection with other pathogens not detected by the test.  Clinical correlation with patient history and  other diagnostic information is necessary to determine patient infection status.  The expected result is negative.  Fact Sheet for Patients:    StrictlyIdeas.no   Fact Sheet for Healthcare Providers:   BankingDealers.co.za    This test is not yet approved or cleared by the [place] FDA and  has been authorized for detection and/or diagnosis of SARS-CoV-2 by FDA under an Emergency Use Authorization (EUA).  This EUA will remain in effect (meaning this test can  be used) for the duration of  the COVID-19 declaration under Section 564(b)(1) of the Act, 21 U.S.C. section 360-bbb-3(b)(1), unless the authorization is terminated or revoked sooner.  Performed at [facility], [place] [address], [place], [place] [address]    Creatinine: Recent Labs    03/10/20 0815  CREATININE 2.50*    Impression/Assessment/plan:  I discussed the CT findings with the patient and the nature risk benefits and alternatives to cystoscopy with left retrograde pyelogram and left ureteral stent placement of his native ureter and kidney.  He is requiring pressors.  I told him we could not tell for sure if this was more Covid related or stone/UTI related, but urgent stent is indicated. We discussed he will need follow-up ureteroscopy with laser lithotripsy and the temporary nature of ureteral stents.  I considered a nephrostomy tube as well and discussed it, but he does not have significant hydronephrosis and nephrostomy tube might not be straightforward either.  All questions answered.  He elects to proceed.  Festus Aloe 03/10/2020, 3:10 PM

## 2020-03-10 NOTE — ED Provider Notes (Signed)
.  Central Line  Date/Time: 03/10/2020 1:45 PM Performed by: Blanchie Dessert, MD Authorized by: Blanchie Dessert, MD   Consent:    Consent obtained:  Verbal   Consent given by:  Patient and spouse   Risks discussed:  Incorrect placement, bleeding, infection and arterial puncture   Alternatives discussed:  No treatment Pre-procedure details:    Hand hygiene: Hand hygiene performed prior to insertion     Sterile barrier technique: All elements of maximal sterile technique followed     Skin preparation:  2% chlorhexidine and ChloraPrep   Skin preparation agent: Skin preparation agent completely dried prior to procedure   Anesthesia (see MAR for exact dosages):    Anesthesia method:  Local infiltration   Local anesthetic:  Lidocaine 2% WITH epi Procedure details:    Location:  R internal jugular   Site selection rationale:  Easy access and good visualization with [place]   Patient position:  Trendelenburg   Procedural supplies:  Triple lumen   Landmarks identified: yes     Ultrasound guidance: yes     Sterile ultrasound techniques: Sterile gel and sterile probe covers were used     Number of attempts:  1   Successful placement: yes   Post-procedure details:    Post-procedure:  Dressing applied and line sutured   Assessment:  Blood return through all ports, no pneumothorax on x-ray, placement verified by x-ray and free fluid flow   Patient tolerance of procedure:  Tolerated well, no immediate complications   69 year old male with multiple medical problems including renal and liver transplant presenting in sepsis.  Patient has a temperature of 105 rectally as well as tachycardia and hypotension.  Patient also did test positive for Covid.  Patient is having abdominal pain and vomiting.  He is somnolent but still will awaken to voice.  Patient was given 30/kg of fluid with additional fluid of a total of 4 L.  He continued to have hypotension with pressures between 33-00 systolic and maps less  than 65.  Patient was then started on Levophed and central line was placed.  Patient is currently requiring 12 mcg of Levophed to maintain pressures.  He was found to have retained stones in the left kidney and concern for infected stone as his source.  He has had no respiratory compromise at this time and is on 2 L for comfort.  Chest x-ray initially was clear and line is in correct placement.  Patient was covered broadly with antibiotics and given stress dose steroids.  Critical care will admit and urology to evaluate the patient.  CRITICAL CARE Performed by: Burlene Montecalvo Total critical care time: 30 minutes Critical care time was exclusive of separately billable procedures and treating other patients. Critical care was necessary to treat or prevent imminent or life-threatening deterioration. Critical care was time spent personally by me on the following activities: development of treatment plan with patient and/or surrogate as well as nursing, discussions with consultants, evaluation of patient's response to treatment, examination of patient, obtaining history from patient or surrogate, ordering and performing treatments and interventions, ordering and review of laboratory studies, ordering and review of radiographic studies, pulse oximetry and re-evaluation of patient's condition.     Blanchie Dessert, MD 03/10/20 1350

## 2020-03-10 NOTE — ED Notes (Signed)
Patient transported to CT 

## 2020-03-10 NOTE — H&P (Signed)
NAME:  [name], MRN:  [id number], DOB:  04/02/1951, LOS: 0 ADMISSION DATE:  03/10/2020, CONSULTATION DATE:  03/10/2020 REFERRING MD:  Blanchie Dessert, MD, CHIEF COMPLAINT: Left lower quadrant abdominal pain, nausea and vomiting  Brief History   69 year old male with diabetes, status post liver and kidney transplant coming in with fever, abdominal pain.  CT abdomen and pelvis showed left ureteral stone with hydronephrosis.  Admitted with septic shock due to UTI  History of present illness   69 year old male with diabetes, status post liver and kidney transplant in 2017 who was brought into the emergency department with a complaint of abdominal pain, nausea, vomiting and constipation for 4 days. As per patient he was in usual state of health until about 3 weeks ago when he started having dry cough with no shortness of breath, this morning he started with left lower abdominal pain associated with nausea and vomiting and shivering so he came to the emergency department for evaluation.  In the emergency department he was noted to have fever of 105 and UA was positive.  CT abdomen and pelvis showed 3 ureteral stones with moderate hydronephrosis.  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 Transplant kidney within the RIGHT pelvis appears normal, with expected mild pelviectasis. Stable splenomegaly. Umbilical abdominal wall hernia which contains fat only.  Micro Data:  9/4 Covid  positive 9/4 blood cultures >> 9/4 urine culture>>  Antimicrobials:  9/4 cefepime >> 9/4 Flagyl  Interim history/subjective:  N/A  Objective   Blood pressure 98/64, pulse (!) 103, temperature (!) 102 F (38.9 C), temperature source Rectal, resp. rate (!) 25, height 5\' 10"  (1.778 m), weight 102.5 kg, SpO2 100 %.        Intake/Output Summary (Last 24 hours) at 03/10/2020 1432 Last data filed at 03/10/2020 1026 Gross per 24 hour  Intake 2100 ml  Output --  Net 2100 ml   Filed Weights   03/10/20 0730  Weight: 102.5 kg    Examination:   Physical exam: General: Chronically ill-appearing morbidly obese male, lying in the bed HEAENT: [place]/AT, eyes anicteric. Neuro: Awake but confused.  Following simple commands, moving all 4 extremities Chest: Coarse breath sounds, no wheezes or rhonchi Heart: Regular rate and rhythm, no murmurs or gallops Abdomen: Soft, tender in the left flank region, nondistended, bowel sounds present Skin: No rash  Resolved Hospital Problem list   N/A  Assessment & Plan:  Severe sepsis with septic shock due to UTI Obstructive uropathy with moderate left-sided hydronephrosis Status post liver and kidney transplant on immunosuppression Diabetes type 2 Morbid obesity Lactic acidosis CKD  Patient received 4 L of IV fluid, his blood pressure remained in the 80s Started on Levophed with map goal > 65 Continue cefepime Follow-up blood and urine culture Trend lactic acid level Monitor serum creatinine and the LFTs Urology has been consulted, patient will undergo ureteral stent today At home patient is on CellCept, tacrolimus and prednisone Started on stress dose steroid We will get tacrolimus level in the morning, hold  off immunosuppressant for now We will consider resumption of immunosuppression by tomorrow Continue sliding scale insulin Monitor fingerstick Nutritionist consult  Best practice:  Diet: NPO Pain/Anxiety/Delirium protocol (if  indicated): Fentanyl as needed VAP protocol (if indicated): N/A DVT prophylaxis: Subcu Lovenox GI prophylaxis: N/A Glucose control: SSI Mobility: As tolerated Code Status: Full code Family Communication: Patient's family was updated at bedside Disposition: ICU  Labs   CBC: Recent Labs  Lab 03/10/20 0815 03/10/20 1058  WBC 3.9*  --   NEUTROABS 3.7  --   HGB 13.9 12.2*  HCT 45.1 36.0*  MCV 74.9*  --   PLT 95*  --     Basic Metabolic Panel: Recent Labs  Lab 03/10/20 0815 03/10/20 1058  NA 139 145  K 4.0 3.2*  CL 108  --   CO2 18*  --   GLUCOSE 109*  --   BUN 32*  --   CREATININE 2.50*  --   CALCIUM 8.8*  --    GFR: Estimated Creatinine Clearance: 34.4 mL/min (A) (by C-G formula based on SCr of 2.5 mg/dL (H)). Recent Labs  Lab 03/10/20 0801 03/10/20 0815 03/10/20 1047  WBC  --  3.9*  --   LATICACIDVEN 3.2*  --  3.7*    Liver Function Tests: Recent Labs  Lab 03/10/20 0815  AST 31  ALT 19  ALKPHOS 82  BILITOT 1.0  PROT 6.3*  ALBUMIN 3.1*   Recent Labs  Lab 03/10/20 0801  LIPASE 34   Recent Labs  Lab 03/10/20 0801  AMMONIA 48*    ABG    Component Value Date/Time   HCO3 17.0 (L) 03/10/2020 1058   TCO2 18 (L) 03/10/2020 1058   ACIDBASEDEF 6.0 (H) 03/10/2020 1058   O2SAT 93.0 03/10/2020 1058     Coagulation Profile: Recent Labs  Lab 03/10/20 0815  INR 1.2    Cardiac Enzymes: No results for input(s): CKTOTAL, CKMB, CKMBINDEX, TROPONINI in the last 168 hours.  HbA1C: Hgb A1c MFr Bld  Date/Time Value Ref Range Status  09/06/2015 07:42 PM 6.6 (H) 4.8 - 5.6 % Final    Comment:    (NOTE)         Pre-diabetes: 5.7 - 6.4         Diabetes: >6.4         Glycemic control for adults with diabetes: <7.0   04/26/2015 06:04 PM 6.4 (H) 4.8 - 5.6 % Final    Comment:    (NOTE)         Pre-diabetes: 5.7 - 6.4         Diabetes: >6.4         Glycemic control for adults with diabetes: <7.0     CBG: No results for input(s): GLUCAP in the  last 168 hours.  Review of Systems:   Positive for nausea, vomiting, cough.  Rest of the 12 point review of system is negative  Past Medical History  He,  has a past medical history of Abscess in epidural space of cervical spine (08/13/2015), Acute hepatic encephalopathy, Acute renal failure superimposed on stage 3 chronic kidney disease (HCC), Anemia of chronic disease, Ascites, Bilateral carpal tunnel syndrome (01/27/2018), Bleeding esophageal varices ([place]), Coagulopathy ([place]), Depression, Diabetes mellitus type II, controlled ([place]), Diabetic neuropathy ([place]), Esophageal stricture, NASH (nonalcoholic steatohepatitis), Protein-calorie malnutrition, severe ([place]), Secondary biliary cirrhosis ([place]), and Thrombocytopenia ([place]).   Surgical History    Past Surgical History:  Procedure Laterality Date  . COLONOSCOPY    . ESOPHAGEAL BANDING Bilateral  11/29/2013   Procedure: ESOPHAGEAL BANDING;  Surgeon: Irene Shipper, MD;  Location: [facility];  Service: Endoscopy;  Laterality: Bilateral;  . ESOPHAGEAL BANDING N/A 06/13/2014   Procedure: ESOPHAGEAL BANDING;  Surgeon: Irene Shipper, MD;  Location: [facility];  Service: Endoscopy;  Laterality: N/A;  . ESOPHAGOGASTRODUODENOSCOPY  08/01/2011   Procedure: ESOPHAGOGASTRODUODENOSCOPY (EGD);  Surgeon: Scarlette Shorts, MD;  Location: [facility];  Service: Endoscopy;  Laterality: N/A;  . ESOPHAGOGASTRODUODENOSCOPY N/A 10/27/2012   Procedure: ESOPHAGOGASTRODUODENOSCOPY (EGD);  Surgeon: Inda Castle, MD;  Location: [facility];  Service: Endoscopy;  Laterality: N/A;  . ESOPHAGOGASTRODUODENOSCOPY N/A 11/22/2012   Procedure: ESOPHAGOGASTRODUODENOSCOPY (EGD);  Surgeon: Irene Shipper, MD;  Location: [facility];  Service: Endoscopy;  Laterality: N/A;  . ESOPHAGOGASTRODUODENOSCOPY N/A 11/29/2013   Procedure: ESOPHAGOGASTRODUODENOSCOPY (EGD);  Surgeon: Irene Shipper, MD;  Location: [facility];  Service: Endoscopy;  Laterality: N/A;  . ESOPHAGOGASTRODUODENOSCOPY N/A  06/13/2014   Procedure: ESOPHAGOGASTRODUODENOSCOPY (EGD);  Surgeon: Irene Shipper, MD;  Location: [facility];  Service: Endoscopy;  Laterality: N/A;  . KIDNEY STONE SURGERY  yrs ago  . 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 UPPER GASTROINTESTINAL ENDOSCOPY       Social History   reports that he has never smoked. He has never used smokeless tobacco. He reports that he does not drink alcohol and does not use drugs.   Family History   His family history includes Aneurysm in his mother; Diabetes in his father and unknown relative; Heart disease in his father and another family member. There is no history of Colon cancer, Anesthesia problems, Hypotension, Malignant hyperthermia, or Pseudochol deficiency.   Allergies No Known Allergies   Home Medications  Prior to Admission medications   Medication Sig Start Date End Date Taking? Authorizing Provider  acetaminophen (TYLENOL) 325 MG tablet Take 650 mg by mouth every 8 (eight) hours as needed.  11/20/15  Yes [provider]  aspirin EC 81 MG tablet Take 81 mg by mouth daily.  11/15/15  Yes [provider]  HUMALOG KWIKPEN 100 UNIT/ML KiwkPen Inject 24-26 Units into the skin in the morning and at bedtime. Use 24 units in the AM and 26 units at bedtime 04/11/16  Yes [provider]  insulin glargine, 1 Unit Dial, (TOUJEO SOLOSTAR) 300 UNIT/ML Solostar Pen Inject 48 Units into the skin at bedtime. 06/08/19  Yes [provider]  mycophenolate (CELLCEPT) 250 MG capsule Take 500 mg by mouth 2 (two) times daily.  06/09/17  Yes [provider]  omeprazole (PRILOSEC) 20 MG capsule Take 20 mg by mouth 2 (two) times daily. 02/13/20  Yes [provider]  predniSONE (DELTASONE) 5 MG tablet Take 5 mg by mouth daily.  01/01/16  Yes [provider]  pregabalin (LYRICA) 50 MG capsule TAKE 1 CAPSULE BY MOUTH IN THE MORNING, TAKE 1 CAPSULE MIDDAY AND 2 IN THE EVENING Patient taking differently: Take 50-100 mg by mouth See  admin instructions. TAKE 1 CAPSULE ( 50mg ) BY MOUTH IN THE MORNING, TAKE 1 CAPSULE (50mg ) MIDDAY AND 2 capsules (100mg ) IN THE EVENING 01/26/19  Yes [name], NP  sertraline (ZOLOFT) 50 MG tablet Take 50 mg by mouth daily. 02/26/20  Yes [provider]  Tacrolimus (ENVARSUS XR) 1 MG TB24 Take 4 mg by mouth daily.    Yes [provider]  traZODone (DESYREL) 50 MG tablet Take 50 mg by mouth at bedtime. 02/10/20  Yes [provider]     Total critical care time: 47  minutes  Performed by: Jacky Kindle   Critical care time was exclusive of separately billable procedures and treating other patients.   Critical care was necessary to treat or prevent imminent or life-threatening deterioration.   Critical care was time spent personally by me on the following activities: development of treatment plan with patient and/or surrogate as well as nursing, discussions with consultants, evaluation of patient's response to treatment, examination of patient, obtaining history from patient or surrogate, ordering and performing treatments and interventions, ordering and review of laboratory studies, ordering and review of radiographic studies, pulse oximetry and re-evaluation of patient's condition.   Jacky Kindle MD Critical care physician [employer] [facility]  Pager: [phone] Mobile: [phone]

## 2020-03-10 NOTE — ED Triage Notes (Signed)
Pt. Arrived [facility] form home c/o cloudy urine, persistent cough, constipation x4 days, and vomiting. Hx. CVA w multiple deficits , liver and kidney transplant, and surgery for removal of cyst on back of neck in February. Vitals: b/p 122/78 HR 144 SATS 95 RA Temp 100.5 CBG 125

## 2020-03-10 NOTE — Op Note (Addendum)
Preoperative diagnosis: Sepsis, left ureteral stone, left hydronephrosis Postoperative diagnosis: Same  Procedure: Cystoscopy with left retrograde pyelogram and left ureteral stent placement  Surgeon: Junious Silk  Anesthesia: General  Indication for procedure: Mr. Talerico is a 69 year old male status post liver and kidney transplant.  He developed abdominal pain and fever and CT scan revealed a 7 mm stone that was previously noted to be in his left kidney had dropped into the left proximal ureter was causing hydronephrosis and perinephric edema.  His urinalysis was consistent with infection.  He was brought for urgent stent.  Findings: On exam under anesthesia the meatus, glans and penis were all normal.  Scrotum appeared normal.  On cystoscopy the urethra and prostate were unremarkable.  Urine was cloudy.  I was not able to inspect the entire bladder or locate the transplant ureteral orifice.  Left ureteral orifice was located and after wire placement a good amount of purulent urine drained.  Left retrograde pyelogram-this outlined a single ureter single collecting system unit with a filling defect in the distal ureter and 1 in the proximal ureter with moderate hydroureteronephrosis and collecting system dilation.  Description of procedure: After consent was obtained patient brought to the operating room.  After adequate anesthesia he was placed lithotomy position and prepped and draped in the usual sterile fashion.  A timeout was performed to confirm the patient and procedure.  The cystoscope was passed per urethra and the left ureteral orifice located and cannulated with a 5 [place] open-ended catheter.  Retrograde injection of contrast was performed.  A sensor wire was then advanced and coiled in the upper calyx.  Purulent urine drained.  A 626 cm stent was advanced.  The wire was removed with a good coil seen in the upper calyx and a good coil in the bladder.  The scope was removed and a 16 French  Foley placed to max drain the system.  He was then awakened and taken to the ICU in guarded condition.  Complications: None  Blood loss: Minimal  Specimens: None  Drains: 6 x 26 cm left ureteral stent  Disposition: Patient stable but in guarded condition to ICU - I called and spoke to [facility] and went over the procedure, post-op care and importance of f/u for URS/HLL/stent/stone removal in a few weeks as outpatient.

## 2020-03-11 DIAGNOSIS — R6520 Severe sepsis without septic shock: Secondary | ICD-10-CM

## 2020-03-11 LAB — PROCALCITONIN: Procalcitonin: 45.15 ng/mL

## 2020-03-11 LAB — CORTISOL-AM, BLOOD: Cortisol - AM: 67.3 ug/dL — ABNORMAL HIGH (ref 6.7–22.6)

## 2020-03-11 LAB — GLUCOSE, CAPILLARY
Glucose-Capillary: 100 mg/dL — ABNORMAL HIGH (ref 70–99)
Glucose-Capillary: 105 mg/dL — ABNORMAL HIGH (ref 70–99)
Glucose-Capillary: 114 mg/dL — ABNORMAL HIGH (ref 70–99)
Glucose-Capillary: 121 mg/dL — ABNORMAL HIGH (ref 70–99)
Glucose-Capillary: 122 mg/dL — ABNORMAL HIGH (ref 70–99)
Glucose-Capillary: 127 mg/dL — ABNORMAL HIGH (ref 70–99)
Glucose-Capillary: 134 mg/dL — ABNORMAL HIGH (ref 70–99)
Glucose-Capillary: 137 mg/dL — ABNORMAL HIGH (ref 70–99)

## 2020-03-11 LAB — PROTIME-INR
INR: 1.4 — ABNORMAL HIGH (ref 0.8–1.2)
Prothrombin Time: 16.8 seconds — ABNORMAL HIGH (ref 11.4–15.2)

## 2020-03-11 MED ORDER — MYCOPHENOLATE MOFETIL 250 MG PO CAPS
500.0000 mg | ORAL_CAPSULE | Freq: Two times a day (BID) | ORAL | Status: DC
  Administered 2020-03-11 – 2020-03-17 (×13): 500 mg via ORAL
  Filled 2020-03-11 (×15): qty 2

## 2020-03-11 MED ORDER — TACROLIMUS 1 MG PO CAPS: 4.0000 mg | ORAL_CAPSULE | Freq: Every day | ORAL | Status: DC

## 2020-03-11 MED ORDER — TACROLIMUS ER 1 MG PO CP24
4.0000 mg | ORAL_CAPSULE | Freq: Every day | ORAL | Status: DC
  Administered 2020-03-11 – 2020-03-17 (×7): 4 mg via ORAL
  Filled 2020-03-11 (×7): qty 4

## 2020-03-11 MED ORDER — ONDANSETRON HCL 4 MG/2ML IJ SOLN
4.0000 mg | Freq: Four times a day (QID) | INTRAMUSCULAR | Status: DC | PRN
  Administered 2020-03-11 – 2020-03-12 (×2): 4 mg via INTRAVENOUS
  Filled 2020-03-11 (×2): qty 2

## 2020-03-11 MED ORDER — LACTATED RINGERS IV SOLN: INTRAVENOUS | Status: AC

## 2020-03-11 NOTE — H&P (Signed)
NAME:  [name], MRN:  [id number], DOB:  08/21/1950, LOS: 1 ADMISSION DATE:  03/10/2020, CONSULTATION DATE:  03/10/2020 REFERRING MD:  Jonathan Dessert, MD, CHIEF COMPLAINT: Left lower quadrant abdominal pain, nausea and vomiting  Brief History   69 year old male with diabetes, status post liver and kidney transplant coming in with fever, abdominal pain.  CT abdomen and pelvis showed left ureteral stone with hydronephrosis.  Admitted with septic shock due to UTI. Urology placed ureteral stent on 9/5.   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 Transplant kidney within the RIGHT pelvis appears normal, with expected mild pelviectasis. Stable splenomegaly. Umbilical abdominal wall hernia which contains fat only.  Micro Data:  9/4 Covid positive 9/4 blood cultures >> E. coli 9/4 urine culture>> gram-negative rods  Antimicrobials:  9/4 cefepime >> 9/4 Flagyl  Interim history/subjective:  Patient had a ureteral stent placed yesterday, he became afebrile afterwards.  His vasopressor requirement has decreased, this morning Levophed was stopped.  Objective   Blood pressure 131/79, pulse 66, temperature 98 F (36.7 C), temperature source Oral, resp. rate 20, height 5\' 10"  (1.778 m), weight 101.4 kg, SpO2 96 %.        Intake/Output Summary (Last 24 hours) at 03/11/2020 1159 Last data filed at 03/11/2020 0500 Gross per 24  hour  Intake 1092.99 ml  Output 725 ml  Net 367.99 ml   Filed Weights   03/10/20 0730 03/10/20 1800  Weight: 102.5 kg 101.4 kg    Examination:   Physical exam: General: Chronically ill-appearing morbidly obese male, lying in the bed HEAENT: [name]/AT, eyes anicteric. Neuro: Alert, awake and oriented x3.  Following commands, moving all 4 extremities Chest: Clear to auscultation bilaterally, no wheezes or rhonchi Heart: Regular rate and rhythm, no murmurs or gallops Abdomen: Soft, tender in the left flank region, nondistended, bowel sounds present Skin: No rash  Resolved Hospital Problem list   Septic shock  Assessment & Plan:  Severe sepsis due to E. coli UTI E. coli bacteremia Acute delirium due to sepsis, improved Obstructive uropathy with moderate left-sided hydronephrosis status post left ureteral stent Status post liver and kidney transplant on immunosuppression Diabetes type 2 Morbid obesity Lactic acidosis CKD   Patient is off Levophed, able to maintain MAP >> 65 Continue cefepime for now until blood and urine culture sensitivities are back Urine and blood culture both growing E. coli Monitor serum creatinine and the LFTs Urology input is appreciated Resume CellCept, tacrolimus and hold prednisone while patient is on stress dose steroid We will get tacrolimus level in the morning Continue sliding scale insulin and Lantus Monitor fingerstick Nutritionist consult  Best practice:  Diet: Regular diet Pain/Anxiety/Delirium protocol (if indicated): VAP protocol (if indicated): N/A DVT prophylaxis: Subcu Lovenox GI prophylaxis: N/A Glucose control: SSI Mobility: As tolerated Code Status: Full code Family Communication: Will update family Disposition: Transfer to medical floor  Labs   CBC: Recent Labs  Lab 03/10/20 0815 03/10/20 1058 03/10/20 1423  WBC 3.9*  --  25.0*  NEUTROABS 3.7  --   --   HGB 13.9 12.2* 12.4*  HCT 45.1 36.0* 40.7  MCV 74.9*  --   79.3*  PLT 95*  --  105*    Basic Metabolic Panel: Recent Labs  Lab 03/10/20 0815 03/10/20 1058 03/10/20 1423  NA 139 145  --   K 4.0 3.2*  --   CL 108  --   --   CO2 18*  --   --   GLUCOSE 109*  --   --   BUN 32*  --   --   CREATININE 2.50*  --  2.74*  CALCIUM 8.8*  --   --    GFR: Estimated Creatinine Clearance: 31.2 mL/min (A) (by C-G formula based on SCr of 2.74 mg/dL (H)). Recent Labs  Lab 03/10/20 0801 03/10/20 0815 03/10/20 1047 03/10/20 1423  WBC  --  3.9*  --  25.0*  LATICACIDVEN 3.2*  --  3.7*  --     Liver Function Tests: Recent Labs  Lab 03/10/20 0815  AST 31  ALT 19  ALKPHOS 82  BILITOT 1.0  PROT 6.3*  ALBUMIN 3.1*   Recent Labs  Lab 03/10/20 0801  LIPASE 34   Recent Labs  Lab 03/10/20 0801  AMMONIA 48*    ABG    Component Value Date/Time   HCO3 17.0 (L) 03/10/2020 1058   TCO2 18 (L) 03/10/2020 1058   ACIDBASEDEF 6.0 (H) 03/10/2020 1058   O2SAT 93.0 03/10/2020 1058     Coagulation Profile: Recent Labs  Lab 03/10/20 0815  INR 1.2    Cardiac Enzymes: No results for input(s): CKTOTAL, CKMB, CKMBINDEX, TROPONINI in the last 168 hours.  HbA1C: Hgb A1c MFr Bld  Date/Time Value Ref Range Status  03/10/2020 06:22 PM 6.0 (H) 4.8 - 5.6 % Final    Comment:    (NOTE) Pre diabetes:          5.7%-6.4%  Diabetes:              >6.4%  Glycemic control for   <7.0% adults with diabetes   09/06/2015 07:42 PM 6.6 (H) 4.8 - 5.6 % Final    Comment:    (NOTE)         Pre-diabetes: 5.7 - 6.4         Diabetes: >6.4         Glycemic control for adults with diabetes: <7.0     CBG: Recent Labs  Lab 03/10/20 1931 03/11/20 0019 03/11/20 0328 03/11/20 0759 03/11/20 1152  GLUCAP 107* 121* 114* 122* 134*    Review of Systems:   Positive for nausea, vomiting, cough.  Rest of the 12 point review of system is negative  Past Medical History  He,  has a past medical history of Abscess in epidural space of cervical spine (08/13/2015),  Acute hepatic encephalopathy, Acute renal failure superimposed on stage 3 chronic kidney disease (HCC), Anemia of chronic disease, Ascites, Bilateral carpal tunnel syndrome (01/27/2018), Bleeding esophageal varices ([place]), Coagulopathy ([place]), Depression, Diabetes mellitus type II, controlled ([place]), Diabetic neuropathy ([place]), Esophageal stricture, NASH (nonalcoholic steatohepatitis), Protein-calorie malnutrition, severe ([place]), Secondary biliary cirrhosis ([place]), and Thrombocytopenia ([place]).   Surgical History    Past Surgical History:  Procedure Laterality Date  . COLONOSCOPY    . ESOPHAGEAL BANDING Bilateral 11/29/2013   Procedure: ESOPHAGEAL BANDING;  Surgeon: Irene Shipper, MD;  Location: [facility];  Service: Endoscopy;  Laterality: Bilateral;  . ESOPHAGEAL BANDING N/A  06/13/2014   Procedure: ESOPHAGEAL BANDING;  Surgeon: Irene Shipper, MD;  Location: [facility];  Service: Endoscopy;  Laterality: N/A;  . ESOPHAGOGASTRODUODENOSCOPY  08/01/2011   Procedure: ESOPHAGOGASTRODUODENOSCOPY (EGD);  Surgeon: Scarlette Shorts, MD;  Location: [facility];  Service: Endoscopy;  Laterality: N/A;  . ESOPHAGOGASTRODUODENOSCOPY N/A 10/27/2012   Procedure: ESOPHAGOGASTRODUODENOSCOPY (EGD);  Surgeon: Inda Castle, MD;  Location: [facility];  Service: Endoscopy;  Laterality: N/A;  . ESOPHAGOGASTRODUODENOSCOPY N/A 11/22/2012   Procedure: ESOPHAGOGASTRODUODENOSCOPY (EGD);  Surgeon: Irene Shipper, MD;  Location: [facility];  Service: Endoscopy;  Laterality: N/A;  . ESOPHAGOGASTRODUODENOSCOPY N/A 11/29/2013   Procedure: ESOPHAGOGASTRODUODENOSCOPY (EGD);  Surgeon: Irene Shipper, MD;  Location: [facility];  Service: Endoscopy;  Laterality: N/A;  . ESOPHAGOGASTRODUODENOSCOPY N/A 06/13/2014   Procedure: ESOPHAGOGASTRODUODENOSCOPY (EGD);  Surgeon: Irene Shipper, MD;  Location: [facility];  Service: Endoscopy;  Laterality: N/A;  . KIDNEY STONE SURGERY  yrs ago  . 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 UPPER GASTROINTESTINAL ENDOSCOPY        Social History   reports that he has never smoked. He has never used smokeless tobacco. He reports that he does not drink alcohol and does not use drugs.   Family History   His family history includes Aneurysm in his mother; Diabetes in his father and unknown relative; Heart disease in his father and another family member. There is no history of Colon cancer, Anesthesia problems, Hypotension, Malignant hyperthermia, or Pseudochol deficiency.   Allergies No Known Allergies   Home Medications  Prior to Admission medications   Medication Sig Start Date End Date Taking? Authorizing Provider  acetaminophen (TYLENOL) 325 MG tablet Take 650 mg by mouth every 8 (eight) hours as needed.  11/20/15  Yes [provider]  aspirin EC 81 MG tablet Take 81 mg by mouth daily.  11/15/15  Yes [provider]  HUMALOG KWIKPEN 100 UNIT/ML KiwkPen Inject 24-26 Units into the skin in the morning and at bedtime. Use 24 units in the AM and 26 units at bedtime 04/11/16  Yes [provider]  insulin glargine, 1 Unit Dial, (TOUJEO SOLOSTAR) 300 UNIT/ML Solostar Pen Inject 48 Units into the skin at bedtime. 06/08/19  Yes [provider]  mycophenolate (CELLCEPT) 250 MG capsule Take 500 mg by mouth 2 (two) times daily.  06/09/17  Yes [provider]  omeprazole (PRILOSEC) 20 MG capsule Take 20 mg by mouth 2 (two) times daily. 02/13/20  Yes [provider]  predniSONE (DELTASONE) 5 MG tablet Take 5 mg by mouth daily.  01/01/16  Yes [provider]  pregabalin (LYRICA) 50 MG capsule TAKE 1 CAPSULE BY MOUTH IN THE MORNING, TAKE 1 CAPSULE MIDDAY AND 2 IN THE EVENING Patient taking differently: Take 50-100 mg by mouth See admin instructions. TAKE 1 CAPSULE ( 50mg ) BY MOUTH IN THE MORNING, TAKE 1 CAPSULE (50mg ) MIDDAY AND 2 capsules (100mg ) IN THE EVENING 01/26/19  Yes [name], NP  sertraline (ZOLOFT) 50 MG tablet Take 50 mg by mouth daily. 02/26/20  Yes [provider]  Tacrolimus (ENVARSUS XR) 1 MG TB24 Take 4 mg by mouth daily.    Yes [provider]  traZODone (DESYREL) 50 MG tablet Take 50 mg by mouth at bedtime. 02/10/20  Yes [provider]      Jacky Kindle MD Critical care physician [place]  Pager: [phone] Mobile: [phone]

## 2020-03-11 NOTE — Transfer of Care (Signed)
Immediate Anesthesia Transfer of Care Note  Patient: [name]  Procedure(s) Performed: CYSTOSCOPY WITH RETROGRADE AND LEFT STENT PLACEMENT (Left )  Patient Location: PACU  Anesthesia Type:General  Level of Consciousness: drowsy  Airway & Oxygen Therapy: Patient Spontanous Breathing and Patient connected to face mask oxygen  Post-op Assessment: Report given to RN and Post -op Vital signs reviewed and stable  Post vital signs: Reviewed and stable  Last Vitals:  Vitals Value Taken Time  BP 131/79 03/11/20 0845  Temp    Pulse 61 03/11/20 0856  Resp 19 03/11/20 0856  SpO2 98 % 03/11/20 0856  Vitals shown include unvalidated device data.  Last Pain:  Vitals:   03/11/20 0400  TempSrc:   PainSc: 0-No pain         Complications: No complications documented.

## 2020-03-11 NOTE — Progress Notes (Signed)
eLink Physician-Brief Progress Note Patient Name: [name] DOB: 1950/11/21 MRN: [id number]   Date of Service  03/11/2020  HPI/Events of Note  Nausea  eICU Interventions  PRN Zofran ordered.     Intervention Category Minor Interventions: Other:  Frederik Pear 03/11/2020, 11:02 PM

## 2020-03-11 NOTE — Progress Notes (Addendum)
1 Day Post-Op Subjective: Patient reports feeling better. Alert compared to yesterday.   Objective: Vital signs in last 24 hours: Temp:  [98 F (36.7 C)-102 F (38.9 C)] 98 F (36.7 C) (09/05 0755) Pulse Rate:  [63-119] 66 (09/05 0845) Resp:  [14-33] 20 (09/05 0845) BP: (73-145)/(51-104) 131/79 (09/05 0845) SpO2:  [95 %-100 %] 96 % (09/05 0845) Weight:  [101.4 kg] 101.4 kg (09/04 1800)  Intake/Output from previous day: 09/04 0701 - 09/05 0700 In: 3193 [I.V.:885; IV Piggyback:2308] Out: 725 [Urine:725] Intake/Output this shift: No intake/output data recorded.  Physical Exam:  NAD Alert Coughing some  Urine clear   Lab Results: Recent Labs    03/10/20 0815 03/10/20 1058 03/10/20 1423  HGB 13.9 12.2* 12.4*  HCT 45.1 36.0* 40.7   BMET Recent Labs    03/10/20 0815 03/10/20 1058 03/10/20 1423  NA 139 145  --   K 4.0 3.2*  --   CL 108  --   --   CO2 18*  --   --   GLUCOSE 109*  --   --   BUN 32*  --   --   CREATININE 2.50*  --  2.74*  CALCIUM 8.8*  --   --    Recent Labs    03/10/20 0815  INR 1.2   No results for input(s): LABURIN in the last 72 hours. Results for orders placed or performed during the hospital encounter of 03/10/20  SARS Coronavirus 2 by RT PCR (hospital order, performed in [facility] hospital lab) Nasopharyngeal Nasopharyngeal Swab     Status: Abnormal   Collection Time: 03/10/20  7:40 AM   Specimen: Nasopharyngeal Swab  Result Value Ref Range Status   SARS Coronavirus 2 POSITIVE (A) NEGATIVE Final    Comment: RESULT CALLED TO, READ BACK BY AND VERIFIED WITH: A,MCKOWN RN @1149  03/10/20 EB (NOTE) SARS-CoV-2 target nucleic acids are DETECTED  SARS-CoV-2 RNA is generally detectable in upper respiratory specimens  during the acute phase of infection.  Positive results are indicative  of the presence of the identified virus, but do not rule out bacterial infection or co-infection with other pathogens not detected by the test.  Clinical  correlation with patient history and  other diagnostic information is necessary to determine patient infection status.  The expected result is negative.  Fact Sheet for Patients:   StrictlyIdeas.no   Fact Sheet for Healthcare Providers:   BankingDealers.co.za    This test is not yet approved or cleared by the [place] FDA and  has been authorized for detection and/or diagnosis of SARS-CoV-2 by FDA under an Emergency Use Authorization (EUA).  This EUA will remain in effect (meaning this test can  be used) for the duration of  the COVID-19 declaration under Section 564(b)(1) of the Act, 21 U.S.C. section 360-bbb-3(b)(1), unless the authorization is terminated or revoked sooner.  Performed at [facility], [place] [address], [place], [place] [address]   Blood Culture (routine x 2)     Status: Abnormal (Preliminary result)   Collection Time: 03/10/20  8:01 AM   Specimen: BLOOD  Result Value Ref Range Status   Specimen Description BLOOD SITE NOT SPECIFIED  Final   Special Requests   Final    BOTTLES DRAWN AEROBIC AND ANAEROBIC Blood Culture adequate volume   Culture  Setup Time   Final    GRAM NEGATIVE COCCI AEROBIC BOTTLE ONLY CRITICAL VALUE NOTED.  VALUE IS CONSISTENT WITH PREVIOUSLY REPORTED AND CALLED VALUE.  Culture (A)  Final    ESCHERICHIA COLI CULTURE REINCUBATED FOR BETTER GROWTH Performed at [facility], [place] [address] [place], [place] [address]    Report Status PENDING  Incomplete  Blood Culture (routine x 2)     Status: Abnormal (Preliminary result)   Collection Time: 03/10/20  2:28 PM   Specimen: BLOOD RIGHT HAND  Result Value Ref Range Status   Specimen Description BLOOD RIGHT HAND  Final   Special Requests   Final    BOTTLES DRAWN AEROBIC AND ANAEROBIC Blood Culture results may not be optimal due to an inadequate volume of blood received in culture bottles   Culture  Setup Time   Final    GRAM  NEGATIVE RODS ANAEROBIC BOTTLE ONLY CRITICAL RESULT CALLED TO, READ BACK BY AND VERIFIED WITH: PHARMD LAURA SEAY AT 2348 BY MESSAN H. ON 03/10/2020    Culture (A)  Final    ESCHERICHIA COLI SUSCEPTIBILITIES TO FOLLOW CULTURE REINCUBATED FOR BETTER GROWTH Performed at [facility], [place] [address], [place], [place] [address]    Report Status PENDING  Incomplete  Blood Culture ID Panel (Reflexed)     Status: Abnormal   Collection Time: 03/10/20  2:28 PM  Result Value Ref Range Status   Enterococcus faecalis NOT DETECTED NOT DETECTED Final   Enterococcus Faecium NOT DETECTED NOT DETECTED Final   Listeria monocytogenes NOT DETECTED NOT DETECTED Final   Staphylococcus species NOT DETECTED NOT DETECTED Final   Staphylococcus aureus (BCID) NOT DETECTED NOT DETECTED Final   Staphylococcus epidermidis NOT DETECTED NOT DETECTED Final   Staphylococcus lugdunensis NOT DETECTED NOT DETECTED Final   Streptococcus species NOT DETECTED NOT DETECTED Final   Streptococcus agalactiae NOT DETECTED NOT DETECTED Final   Streptococcus pneumoniae NOT DETECTED NOT DETECTED Final   Streptococcus pyogenes NOT DETECTED NOT DETECTED Final   A.calcoaceticus-baumannii NOT DETECTED NOT DETECTED Final   Bacteroides fragilis NOT DETECTED NOT DETECTED Final   Enterobacterales DETECTED (A) NOT DETECTED Final    Comment: Enterobacterales represent a large order of gram negative bacteria, not a single organism. CRITICAL RESULT CALLED TO, READ BACK BY AND VERIFIED WITH: PHARMD LAURA SEAY AT 2348 BY MESSAN H. ON 03/10/2020    Enterobacter cloacae complex NOT DETECTED NOT DETECTED Final   Escherichia coli DETECTED (A) NOT DETECTED Final    Comment: CRITICAL RESULT CALLED TO, READ BACK BY AND VERIFIED WITH: PHARMD LAURA SEAY AT 2348 BY MESSAN H. ON 03/10/2020    Klebsiella aerogenes NOT DETECTED NOT DETECTED Final   Klebsiella oxytoca NOT DETECTED NOT DETECTED Final   Klebsiella pneumoniae NOT DETECTED NOT DETECTED  Final   Proteus species NOT DETECTED NOT DETECTED Final   Salmonella species NOT DETECTED NOT DETECTED Final   Serratia marcescens NOT DETECTED NOT DETECTED Final   Haemophilus influenzae NOT DETECTED NOT DETECTED Final   Neisseria meningitidis NOT DETECTED NOT DETECTED Final   Pseudomonas aeruginosa NOT DETECTED NOT DETECTED Final   Stenotrophomonas maltophilia NOT DETECTED NOT DETECTED Final   Candida albicans NOT DETECTED NOT DETECTED Final   Candida auris NOT DETECTED NOT DETECTED Final   Candida glabrata NOT DETECTED NOT DETECTED Final   Candida krusei NOT DETECTED NOT DETECTED Final   Candida parapsilosis NOT DETECTED NOT DETECTED Final   Candida tropicalis NOT DETECTED NOT DETECTED Final   Cryptococcus neoformans/gattii NOT DETECTED NOT DETECTED Final   CTX-M ESBL NOT DETECTED NOT DETECTED Final   Carbapenem resistance IMP NOT DETECTED NOT DETECTED Final   Carbapenem  resistance KPC NOT DETECTED NOT DETECTED Final   Carbapenem resistance NDM NOT DETECTED NOT DETECTED Final   Carbapenem resist OXA 48 LIKE NOT DETECTED NOT DETECTED Final   Carbapenem resistance VIM NOT DETECTED NOT DETECTED Final    Comment: Performed at [facility], [place] [address] [place], [place] [address]  MRSA PCR Screening     Status: Abnormal   Collection Time: 03/10/20  6:08 PM   Specimen: Nasopharyngeal  Result Value Ref Range Status   MRSA by PCR POSITIVE (A) NEGATIVE Final    Comment:        The GeneXpert MRSA Assay (FDA approved for NASAL specimens only), is one component of a comprehensive MRSA colonization surveillance program. It is not intended to diagnose MRSA infection nor to guide or monitor treatment for MRSA infections. RESULT CALLED TO, READ BACK BY AND VERIFIED WITH: NATALE,G RN AT 2105 03/10/2020 MITCHELL,L Performed at [facility], [place] [address] [place], [place] [address]   Culture, blood (routine x 2)     Status: None (Preliminary result)   Collection Time:  03/10/20  6:52 PM   Specimen: BLOOD LEFT HAND  Result Value Ref Range Status   Specimen Description BLOOD LEFT HAND  Final   Special Requests   Final    BOTTLES DRAWN AEROBIC AND ANAEROBIC Blood Culture results may not be optimal due to an inadequate volume of blood received in culture bottles   Culture   Final    NO GROWTH < 12 HOURS Performed at [facility], [place] [address] [place], [place] [address]    Report Status PENDING  Incomplete  Culture, blood (routine x 2)     Status: None (Preliminary result)   Collection Time: 03/10/20  7:05 PM   Specimen: BLOOD  Result Value Ref Range Status   Specimen Description BLOOD RIGHT THUMB  Final   Special Requests   Final    BOTTLES DRAWN AEROBIC ONLY Blood Culture results may not be optimal due to an inadequate volume of blood received in culture bottles   Culture   Final    NO GROWTH < 12 HOURS Performed at [facility], [place] [address], [place], [place] [address]    Report Status PENDING  Incomplete    Studies/Results: CT ABDOMEN PELVIS WO CONTRAST  Result Date: 03/10/2020 CLINICAL DATA:  Nausea, vomiting, constipation and fever for 4 days. History of kidney and liver transplant. EXAM: CT ABDOMEN AND PELVIS WITHOUT CONTRAST TECHNIQUE: Multidetector CT imaging of the abdomen and pelvis was performed following the standard protocol without IV contrast. COMPARISON:  CT abdomen dated 02/22/2007. FINDINGS: Lower chest: No acute abnormality. Hepatobiliary: No focal liver abnormality. No bile duct dilatation. Gallbladder not seen, presumed cholecystectomy. Pancreas: Unremarkable. No pancreatic ductal dilatation or surrounding inflammatory changes. Spleen: Stable splenomegaly. Adrenals/Urinary Tract: At least 2, probably 3 or 4, abutting stones within the mid LEFT ureter, L4-L5 vertebral body level, largest stone measuring 7 mm, causing moderate LEFT-sided hydronephrosis and associated perinephric edema. Benign-appearing cyst exophytic to  the lower pole of the RIGHT kidney, measuring approximately 4 cm greatest dimension. No RIGHT-sided renal stone or hydronephrosis. No RIGHT-sided ureteral stone. Bladder is unremarkable, partially decompressed. Transplant kidney within the RIGHT pelvis appears normal, with expected mild pelviectasis. Stomach/Bowel: No dilated large or small bowel loops. No evidence of bowel wall inflammation. Appendix is normal. Vascular/Lymphatic: No significant vascular findings are present. No enlarged abdominal or pelvic lymph nodes. Reproductive: Prostate is unremarkable. Other: No free  fluid or abscess collection. No free intraperitoneal air. Musculoskeletal: Degenerative spondylosis of the lumbar spine, mild to moderate in degree. No acute or suspicious osseous finding. Umbilical abdominal wall hernia which contains fat only. IMPRESSION: 1. At least 2, probably 3 or more, abutting stones within the mid LEFT ureter, largest stone measuring 7 mm, causing moderate LEFT-sided hydronephrosis and associated perinephric edema. 2. Transplant kidney within the RIGHT pelvis appears normal, with expected mild pelviectasis. 3. Stable splenomegaly. 4. Umbilical abdominal wall hernia which contains fat only. 5. Additional chronic/incidental findings detailed above. Electronically Signed   By: Franki Cabot M.D.   On: 03/10/2020 10:17   DG Retrograde Pyelogram  Result Date: 03/10/2020 CLINICAL DATA:  Portable fluoroscopic imaging for retrograde ureteral pyelography. EXAM: DG C-ARM 1-60 MIN; RETROGRADE PYELOGRAM FLUOROSCOPY TIME:  Fluoroscopy Time:  0 minutes and 31 seconds Radiation Exposure Index (if provided by the fluoroscopic device): 9.3 mGy Number of Acquired Spot Images: 5 COMPARISON:  CT, 03/10/2020 FINDINGS: Images show the retrograde injection of the left ureter. There is an oval filling defect in the distal ureter with a more elongated filling defect in the proximal ureter. At least 1 filling defect is noted in a opacified  left renal calyx. IMPRESSION: Imaging provided for left retrograde ureteral pyelography. Electronically Signed   By: Lajean Manes M.D.   On: 03/10/2020 17:09   DG Chest Portable 1 View  Result Date: 03/10/2020 CLINICAL DATA:  Central line placement. EXAM: PORTABLE CHEST 1 VIEW COMPARISON:  Radiographs 03/10/2020 and 09/14/2015. FINDINGS: 1314 hours. Right IJ central venous catheter projects to the level of the upper SVC. The heart size and mediastinal contours are stable. There is no pneumothorax or significant pleural effusion. There are persistent low lung volumes with mild pulmonary interstitial prominence. No focal airspace disease or edema. The bones appear unchanged. Telemetry leads overlie the chest. IMPRESSION: Central venous catheter placement as described. No pneumothorax or other significant changes. Electronically Signed   By: Richardean Sale M.D.   On: 03/10/2020 13:49   DG Chest Port 1 View  Result Date: 03/10/2020 CLINICAL DATA:  Questionable sepsis EXAM: PORTABLE CHEST 1 VIEW COMPARISON:  09/14/2015 FINDINGS: Normal heart size and mediastinal contours. Prominent lung markings at the bases, but stable. There is no edema, consolidation, effusion, or pneumothorax. IMPRESSION: Stable exam.  No acute finding. Electronically Signed   By: Monte Fantasia M.D.   On: 03/10/2020 08:03   DG C-Arm 1-60 Min  Result Date: 03/10/2020 CLINICAL DATA:  Portable fluoroscopic imaging for retrograde ureteral pyelography. EXAM: DG C-ARM 1-60 MIN; RETROGRADE PYELOGRAM FLUOROSCOPY TIME:  Fluoroscopy Time:  0 minutes and 31 seconds Radiation Exposure Index (if provided by the fluoroscopic device): 9.3 mGy Number of Acquired Spot Images: 5 COMPARISON:  CT, 03/10/2020 FINDINGS: Images show the retrograde injection of the left ureter. There is an oval filling defect in the distal ureter with a more elongated filling defect in the proximal ureter. At least 1 filling defect is noted in a opacified left renal calyx.  IMPRESSION: Imaging provided for left retrograde ureteral pyelography. Electronically Signed   By: Lajean Manes M.D.   On: 03/10/2020 17:09    Assessment/Plan: -Sepsis, UTI - improving p left ureteral stent. Can d/c foley once he gets out of bed-labs pending today, cultures pending   -left ureteral stone of native left kidney - I will plan left ureteroscopy, stone removal, stent exchange to be done as outpatient in a few weeks. Discussed with patient the nature, risk, benefits and alternatives  to the procedure and importance of f/u.   I will sign off but please page GU with any questions, concerns or changes in patient status.    LOS: 1 day   [name] 03/11/2020, 9:49 AM

## 2020-03-12 DIAGNOSIS — E1142 Type 2 diabetes mellitus with diabetic polyneuropathy: Secondary | ICD-10-CM

## 2020-03-12 DIAGNOSIS — N179 Acute kidney failure, unspecified: Secondary | ICD-10-CM

## 2020-03-12 DIAGNOSIS — R7881 Bacteremia: Secondary | ICD-10-CM

## 2020-03-12 DIAGNOSIS — E1121 Type 2 diabetes mellitus with diabetic nephropathy: Secondary | ICD-10-CM

## 2020-03-12 DIAGNOSIS — A419 Sepsis, unspecified organism: Secondary | ICD-10-CM

## 2020-03-12 DIAGNOSIS — Z940 Kidney transplant status: Secondary | ICD-10-CM

## 2020-03-12 DIAGNOSIS — N390 Urinary tract infection, site not specified: Secondary | ICD-10-CM

## 2020-03-12 DIAGNOSIS — Z944 Liver transplant status: Secondary | ICD-10-CM

## 2020-03-12 LAB — CBC
HCT: 34.1 % — ABNORMAL LOW (ref 39.0–52.0)
Hemoglobin: 10.7 g/dL — ABNORMAL LOW (ref 13.0–17.0)
MCH: 23.4 pg — ABNORMAL LOW (ref 26.0–34.0)
MCHC: 31.4 g/dL (ref 30.0–36.0)
MCV: 74.5 fL — ABNORMAL LOW (ref 80.0–100.0)
Platelets: 71 10*3/uL — ABNORMAL LOW (ref 150–400)
RBC: 4.58 MIL/uL (ref 4.22–5.81)
RDW: 17.2 % — ABNORMAL HIGH (ref 11.5–15.5)
WBC: 10.2 10*3/uL (ref 4.0–10.5)
nRBC: 0 % (ref 0.0–0.2)

## 2020-03-12 LAB — CULTURE, BLOOD (ROUTINE X 2): Special Requests: ADEQUATE

## 2020-03-12 LAB — BASIC METABOLIC PANEL
Anion gap: 11 (ref 5–15)
BUN: 43 mg/dL — ABNORMAL HIGH (ref 8–23)
CO2: 18 mmol/L — ABNORMAL LOW (ref 22–32)
Calcium: 8 mg/dL — ABNORMAL LOW (ref 8.9–10.3)
Chloride: 114 mmol/L — ABNORMAL HIGH (ref 98–111)
Creatinine, Ser: 1.94 mg/dL — ABNORMAL HIGH (ref 0.61–1.24)
GFR calc Af Amer: 40 mL/min — ABNORMAL LOW (ref >60)
GFR calc non Af Amer: 35 mL/min — ABNORMAL LOW (ref >60)
Glucose, Bld: 97 mg/dL (ref 70–99)
Potassium: 3.4 mmol/L — ABNORMAL LOW (ref 3.5–5.1)
Sodium: 143 mmol/L (ref 135–145)

## 2020-03-12 LAB — GLUCOSE, CAPILLARY
Glucose-Capillary: 106 mg/dL — ABNORMAL HIGH (ref 70–99)
Glucose-Capillary: 66 mg/dL — ABNORMAL LOW (ref 70–99)
Glucose-Capillary: 77 mg/dL (ref 70–99)
Glucose-Capillary: 82 mg/dL (ref 70–99)
Glucose-Capillary: 91 mg/dL (ref 70–99)
Glucose-Capillary: 95 mg/dL (ref 70–99)

## 2020-03-12 LAB — MAGNESIUM: Magnesium: 1.3 mg/dL — ABNORMAL LOW (ref 1.7–2.4)

## 2020-03-12 LAB — URINE CULTURE: Culture: >=100000 — AB

## 2020-03-12 MED ORDER — PREDNISONE 5 MG PO TABS
5.0000 mg | ORAL_TABLET | Freq: Every day | ORAL | Status: DC
  Administered 2020-03-12 – 2020-03-17 (×6): 5 mg via ORAL
  Filled 2020-03-12 (×6): qty 1

## 2020-03-12 MED ORDER — POLYETHYLENE GLYCOL 3350 17 G PO PACK: 17.0000 g | PACK | Freq: Every day | ORAL | Status: DC | PRN

## 2020-03-12 MED ORDER — PREGABALIN 100 MG PO CAPS
100.0000 mg | ORAL_CAPSULE | Freq: Every day | ORAL | Status: DC
  Administered 2020-03-12 – 2020-03-16 (×5): 100 mg via ORAL
  Filled 2020-03-12 (×5): qty 1

## 2020-03-12 MED ORDER — IPRATROPIUM-ALBUTEROL 20-100 MCG/ACT IN AERS: 1.0000 | INHALATION_SPRAY | Freq: Four times a day (QID) | RESPIRATORY_TRACT | Status: DC | PRN

## 2020-03-12 MED ORDER — ASPIRIN EC 81 MG PO TBEC
81.0000 mg | DELAYED_RELEASE_TABLET | Freq: Every day | ORAL | Status: DC
  Administered 2020-03-12 – 2020-03-17 (×6): 81 mg via ORAL
  Filled 2020-03-12 (×6): qty 1

## 2020-03-12 MED ORDER — PREGABALIN 50 MG PO CAPS: 50.0000 mg | ORAL_CAPSULE | ORAL | Status: DC

## 2020-03-12 MED ORDER — PROMETHAZINE HCL 25 MG/ML IJ SOLN
12.5000 mg | Freq: Three times a day (TID) | INTRAMUSCULAR | Status: DC | PRN
  Administered 2020-03-12: 12.5 mg via INTRAVENOUS
  Filled 2020-03-12: qty 1

## 2020-03-12 MED ORDER — PREGABALIN 50 MG PO CAPS
50.0000 mg | ORAL_CAPSULE | ORAL | Status: DC
  Administered 2020-03-12 – 2020-03-17 (×11): 50 mg via ORAL
  Filled 2020-03-12 (×2): qty 1
  Filled 2020-03-12: qty 2
  Filled 2020-03-12 (×2): qty 1
  Filled 2020-03-12: qty 2
  Filled 2020-03-12 (×2): qty 1
  Filled 2020-03-12 (×2): qty 2
  Filled 2020-03-12: qty 1

## 2020-03-12 MED ORDER — IPRATROPIUM-ALBUTEROL 20-100 MCG/ACT IN AERS
1.0000 | INHALATION_SPRAY | Freq: Four times a day (QID) | RESPIRATORY_TRACT | Status: DC
  Filled 2020-03-12: qty 4

## 2020-03-12 MED ORDER — SERTRALINE HCL 50 MG PO TABS
50.0000 mg | ORAL_TABLET | Freq: Every day | ORAL | Status: DC
  Administered 2020-03-12 – 2020-03-17 (×6): 50 mg via ORAL
  Filled 2020-03-12 (×6): qty 1

## 2020-03-12 MED ORDER — SENNOSIDES-DOCUSATE SODIUM 8.6-50 MG PO TABS: 2.0000 | ORAL_TABLET | Freq: Every evening | ORAL | Status: DC | PRN

## 2020-03-12 MED ORDER — TRAZODONE HCL 50 MG PO TABS
50.0000 mg | ORAL_TABLET | Freq: Every day | ORAL | Status: DC
  Administered 2020-03-12 – 2020-03-16 (×5): 50 mg via ORAL
  Filled 2020-03-12 (×5): qty 1

## 2020-03-12 MED ORDER — ACETAMINOPHEN 325 MG PO TABS: 650.0000 mg | ORAL_TABLET | Freq: Four times a day (QID) | ORAL | Status: DC | PRN

## 2020-03-12 MED ORDER — TACROLIMUS ER 1 MG PO TB24: 4.0000 mg | ORAL_TABLET | Freq: Every day | ORAL | Status: DC

## 2020-03-12 MED ORDER — PANTOPRAZOLE SODIUM 40 MG PO TBEC
40.0000 mg | DELAYED_RELEASE_TABLET | Freq: Every day | ORAL | Status: DC
  Administered 2020-03-12 – 2020-03-17 (×6): 40 mg via ORAL
  Filled 2020-03-12 (×6): qty 1

## 2020-03-12 MED ORDER — DM-GUAIFENESIN ER 30-600 MG PO TB12
1.0000 | ORAL_TABLET | Freq: Two times a day (BID) | ORAL | Status: DC | PRN
  Administered 2020-03-14 – 2020-03-16 (×3): 1 via ORAL
  Filled 2020-03-12 (×4): qty 1

## 2020-03-12 MED ORDER — SODIUM CHLORIDE 0.9 % IV SOLN
2.0000 g | INTRAVENOUS | Status: DC
  Administered 2020-03-12: 2 g via INTRAVENOUS
  Filled 2020-03-12: qty 20

## 2020-03-12 MED ORDER — MYCOPHENOLATE MOFETIL 250 MG PO CAPS: 500.0000 mg | ORAL_CAPSULE | Freq: Two times a day (BID) | ORAL | Status: DC

## 2020-03-12 NOTE — Progress Notes (Signed)
Hypoglycemic Event  CBG:66  Treatment: 4 oz juice/soda  Symptoms: None  Follow-up CBG: Time0020 CBG Result:73  Possible Reasons for Event: Inadequate meal intake  Comments/MD notified:    [place] N Shahzain Kiester

## 2020-03-12 NOTE — Progress Notes (Signed)
Patient transferred from 30M to 5W-34. No acute distress noted. VSS. Will continue to monitor.

## 2020-03-12 NOTE — Progress Notes (Signed)
PROGRESS NOTE    [name]  LOV:[id number] DOB: 04-10-1951 DOA: 03/10/2020 PCP: Nicoletta Dress, MD   Brief Narrative:  69 year old with history of DM2, status post liver and renal transplant, history of [name] cirrhosis, peripheral neuropathy, morbid obesity presented to the hospital with fevers chills found to be in septic shock secondary to urinary tract infection.  Had left-sided renal stone with hydronephrosis, underwent ureteral stent placement by urology on 9/5.  He was tested positive for COVID-19 on 9/4.  Urine cultures and blood cultures were positive for gram-negative rods, empirically started on cefepime.   Assessment & Plan:   Active Problems:   Acute renal failure superimposed on stage 3 chronic kidney disease (HCC)   Controlled type 2 diabetes mellitus with diabetic nephropathy, without long-term current use of insulin (HCC)   Diabetic neuropathy (HCC)   Septic shock (HCC)   Renal transplant recipient   Liver transplanted ([place])   Sepsis secondary to UTI ([place])   Bacteremia due to Gram-negative bacteria   Septic shock secondary to urinary tract infection, gram-negative bacteremia Obstructive nephropathy/renal stone with hydronephrosis, left-sided status post ureteral stent placement 9/5 -Shock physiology has improved.  Transitioned off Levophed. -Currently on stress dose steroids, will discontinue this and transition to home p.o. prednisone -Blood cultures-E. coli -Urine cultures-E. coli -Antibiotics-On Rocephin 2g IV q24hrs.  -Urology following.  Maintain Foley catheter per urology until he is more ambulatory  COVID-19 infection -Patient is not hypoxic.  No infiltrates noted on chest x-ray. He is vaccinated COVID 19 ~3 months ago.  -For now we will provide supportive care, incentive spirometer, flutter valve, as needed inhalers, Mucinex.    History of renal and liver transplant with history of [name] cirrhosis -Currently immunosuppressed on CellCept, tacrolimus  and steroids  Diabetes mellitus type 2 Peripheral neuropathy secondary to DM2 -Accu-Cheks, sliding scale. -Lantus 10 units daily -A1c 6.0   DVT prophylaxis: Lovenox Code Status: Full code Family Communication:  Wife [name] updated.  Status is: Inpatient  Remains inpatient appropriate because:Inpatient level of care appropriate due to severity of illness   Dispo: The patient is from: Home              Anticipated d/c is to: Home              Anticipated d/c date is: 2 days              Patient currently is not medically stable to d/c.   Body mass index is 32.08 kg/m.  Pressure Injury 03/10/20 Sacrum Medial Stage II -  Partial thickness loss of dermis presenting as a shallow open ulcer with a red, pink wound bed without slough. small area of non-intact skin in middle of sacrum surrounded by pink blanchable skin (Active)  03/10/20 1920  Location: Sacrum  Location Orientation: Medial  Staging: Stage II -  Partial thickness loss of dermis presenting as a shallow open ulcer with a red, pink wound bed without slough.  Wound Description (Comments): small area of non-intact skin in middle of sacrum surrounded by pink blanchable skin  Present on Admission: Yes     Pressure Ulcer 09/14/15 Stage I -  Intact skin with non-blanchable redness of a localized area usually over a bony prominence. (Active)  09/14/15 2030  Location: Ischial tuberosity  Location Orientation: Left;Right  Staging: Stage I -  Intact skin with non-blanchable redness of a localized area usually over a bony prominence.  Wound Description (Comments):   Present on Admission: Yes  Subjective: Feels ok, havent really gotten out of bed. Dneies any shortness of breath   Review of Systems Otherwise negative except as per HPI, including: General: Denies fever, chills, night sweats or unintended weight loss. Resp: Denies cough, wheezing, shortness of breath. Cardiac: Denies chest pain, palpitations, orthopnea,  paroxysmal nocturnal dyspnea. GI: Denies abdominal pain, nausea, vomiting, diarrhea or constipation GU: Denies dysuria, frequency, hesitancy or incontinence MS: Denies muscle aches, joint pain or swelling Neuro: Denies headache, neurologic deficits (focal weakness, numbness, tingling), abnormal gait Psych: Denies anxiety, depression, SI/HI/AVH Skin: Denies new rashes or lesions ID: Denies sick contacts, exotic exposures, travel  Examination: Constitutional: Elderly frail Respiratory: Clear to auscultation bilaterally Cardiovascular: Normal sinus rhythm, no rubs Abdomen: Nontender nondistended good bowel sounds Musculoskeletal: No edema noted Skin: No rashes seen Neurologic: CN 2-12 grossly intact.  And nonfocal Psychiatric: Normal judgment and insight. Alert and oriented x 3. Normal mood.   Foley catheter in place  Objective: Vitals:   03/12/20 0400 03/12/20 0500 03/12/20 0600 03/12/20 0700  BP: 132/71 (!) 145/74 (!) 152/86 (!) 143/75  Pulse: 67 65 80 61  Resp: 18 16 16 15   Temp:      TempSrc:      SpO2: 97% 99% 100% 98%  Weight:      Height:        Intake/Output Summary (Last 24 hours) at 03/12/2020 0740 Last data filed at 03/12/2020 0700 Gross per 24 hour  Intake 4594.1 ml  Output 525 ml  Net 4069.1 ml   Filed Weights   03/10/20 0730 03/10/20 1800  Weight: 102.5 kg 101.4 kg     Data Reviewed:   CBC: Recent Labs  Lab 03/10/20 0815 03/10/20 1058 03/10/20 1423  WBC 3.9*  --  25.0*  NEUTROABS 3.7  --   --   HGB 13.9 12.2* 12.4*  HCT 45.1 36.0* 40.7  MCV 74.9*  --  79.3*  PLT 95*  --  253*   Basic Metabolic Panel: Recent Labs  Lab 03/10/20 0815 03/10/20 1058 03/10/20 1423  NA 139 145  --   K 4.0 3.2*  --   CL 108  --   --   CO2 18*  --   --   GLUCOSE 109*  --   --   BUN 32*  --   --   CREATININE 2.50*  --  2.74*  CALCIUM 8.8*  --   --    GFR: Estimated Creatinine Clearance: 31.2 mL/min (A) (by C-G formula based on SCr of 2.74 mg/dL (H)). Liver  Function Tests: Recent Labs  Lab 03/10/20 0815  AST 31  ALT 19  ALKPHOS 82  BILITOT 1.0  PROT 6.3*  ALBUMIN 3.1*   Recent Labs  Lab 03/10/20 0801  LIPASE 34   Recent Labs  Lab 03/10/20 0801  AMMONIA 48*   Coagulation Profile: Recent Labs  Lab 03/10/20 0815 03/11/20 1242  INR 1.2 1.4*   Cardiac Enzymes: No results for input(s): CKTOTAL, CKMB, CKMBINDEX, TROPONINI in the last 168 hours. BNP (last 3 results) No results for input(s): PROBNP in the last 8760 hours. HbA1C: Recent Labs    03/10/20 1822  HGBA1C 6.0*   CBG: Recent Labs  Lab 03/11/20 1546 03/11/20 1716 03/11/20 1953 03/11/20 2325 03/12/20 0336  GLUCAP 137* 127* 105* 100* 106*   Lipid Profile: No results for input(s): CHOL, HDL, LDLCALC, TRIG, CHOLHDL, LDLDIRECT in the last 72 hours. Thyroid Function Tests: No results for input(s): TSH, T4TOTAL, FREET4, T3FREE, THYROIDAB in the last  72 hours. Anemia Panel: No results for input(s): VITAMINB12, FOLATE, FERRITIN, TIBC, IRON, RETICCTPCT in the last 72 hours. Sepsis Labs: Recent Labs  Lab 03/10/20 0801 03/10/20 1047 03/11/20 1242  PROCALCITON  --   --  45.15  LATICACIDVEN 3.2* 3.7*  --     Recent Results (from the past 240 hour(s))  SARS Coronavirus 2 by RT PCR (hospital order, performed in [facility] hospital lab) Nasopharyngeal Nasopharyngeal Swab     Status: Abnormal   Collection Time: 03/10/20  7:40 AM   Specimen: Nasopharyngeal Swab  Result Value Ref Range Status   SARS Coronavirus 2 POSITIVE (A) NEGATIVE Final    Comment: RESULT CALLED TO, READ BACK BY AND VERIFIED WITH: A,MCKOWN RN @1149  03/10/20 EB (NOTE) SARS-CoV-2 target nucleic acids are DETECTED  SARS-CoV-2 RNA is generally detectable in upper respiratory specimens  during the acute phase of infection.  Positive results are indicative  of the presence of the identified virus, but do not rule out bacterial infection or co-infection with other pathogens not detected by the test.   Clinical correlation with patient history and  other diagnostic information is necessary to determine patient infection status.  The expected result is negative.  Fact Sheet for Patients:   StrictlyIdeas.no   Fact Sheet for Healthcare Providers:   BankingDealers.co.za    This test is not yet approved or cleared by the [place] FDA and  has been authorized for detection and/or diagnosis of SARS-CoV-2 by FDA under an Emergency Use Authorization (EUA).  This EUA will remain in effect (meaning this test can  be used) for the duration of  the COVID-19 declaration under Section 564(b)(1) of the Act, 21 U.S.C. section 360-bbb-3(b)(1), unless the authorization is terminated or revoked sooner.  Performed at [facility], [place] [address] [place], [place] [address]   Blood Culture (routine x 2)     Status: Abnormal (Preliminary result)   Collection Time: 03/10/20  8:01 AM   Specimen: BLOOD  Result Value Ref Range Status   Specimen Description BLOOD SITE NOT SPECIFIED  Final   Special Requests   Final    BOTTLES DRAWN AEROBIC AND ANAEROBIC Blood Culture adequate volume   Culture  Setup Time   Final    GRAM NEGATIVE COCCI AEROBIC BOTTLE ONLY CRITICAL VALUE NOTED.  VALUE IS CONSISTENT WITH PREVIOUSLY REPORTED AND CALLED VALUE.    Culture (A)  Final    ESCHERICHIA COLI CULTURE REINCUBATED FOR BETTER GROWTH Performed at [facility], [place] [address], [place], [place] [address]    Report Status PENDING  Incomplete  Urine culture     Status: Abnormal (Preliminary result)   Collection Time: 03/10/20 11:25 AM   Specimen: In/Out Cath Urine  Result Value Ref Range Status   Specimen Description IN/OUT CATH URINE  Final   Special Requests   Final    NONE Performed at [facility], [place] [address] [place],  [address]    Culture >=100,000 COLONIES/mL ESCHERICHIA COLI (A)  Final   Report Status PENDING  Incomplete    Blood Culture (routine x 2)     Status: Abnormal (Preliminary result)   Collection Time: 03/10/20  2:28 PM   Specimen: BLOOD RIGHT HAND  Result Value Ref Range Status   Specimen Description BLOOD RIGHT HAND  Final   Special Requests   Final    BOTTLES DRAWN AEROBIC AND ANAEROBIC Blood Culture results may not be optimal due to an inadequate volume of blood received  in culture bottles   Culture  Setup Time   Final    GRAM NEGATIVE RODS ANAEROBIC BOTTLE ONLY CRITICAL RESULT CALLED TO, READ BACK BY AND VERIFIED WITH: PHARMD LAURA SEAY AT 2348 BY MESSAN H. ON 03/10/2020    Culture (A)  Final    ESCHERICHIA COLI SUSCEPTIBILITIES TO FOLLOW CULTURE REINCUBATED FOR BETTER GROWTH Performed at [facility], [place] [address] [place], [place] [address]    Report Status PENDING  Incomplete  Blood Culture ID Panel (Reflexed)     Status: Abnormal   Collection Time: 03/10/20  2:28 PM  Result Value Ref Range Status   Enterococcus faecalis NOT DETECTED NOT DETECTED Final   Enterococcus Faecium NOT DETECTED NOT DETECTED Final   Listeria monocytogenes NOT DETECTED NOT DETECTED Final   Staphylococcus species NOT DETECTED NOT DETECTED Final   Staphylococcus aureus (BCID) NOT DETECTED NOT DETECTED Final   Staphylococcus epidermidis NOT DETECTED NOT DETECTED Final   Staphylococcus lugdunensis NOT DETECTED NOT DETECTED Final   Streptococcus species NOT DETECTED NOT DETECTED Final   Streptococcus agalactiae NOT DETECTED NOT DETECTED Final   Streptococcus pneumoniae NOT DETECTED NOT DETECTED Final   Streptococcus pyogenes NOT DETECTED NOT DETECTED Final   A.calcoaceticus-baumannii NOT DETECTED NOT DETECTED Final   Bacteroides fragilis NOT DETECTED NOT DETECTED Final   Enterobacterales DETECTED (A) NOT DETECTED Final    Comment: Enterobacterales represent a large order of gram negative bacteria, not a single organism. CRITICAL RESULT CALLED TO, READ BACK BY AND VERIFIED WITH: PHARMD LAURA SEAY AT 2348  BY MESSAN H. ON 03/10/2020    Enterobacter cloacae complex NOT DETECTED NOT DETECTED Final   Escherichia coli DETECTED (A) NOT DETECTED Final    Comment: CRITICAL RESULT CALLED TO, READ BACK BY AND VERIFIED WITH: PHARMD LAURA SEAY AT 2348 BY MESSAN H. ON 03/10/2020    Klebsiella aerogenes NOT DETECTED NOT DETECTED Final   Klebsiella oxytoca NOT DETECTED NOT DETECTED Final   Klebsiella pneumoniae NOT DETECTED NOT DETECTED Final   Proteus species NOT DETECTED NOT DETECTED Final   Salmonella species NOT DETECTED NOT DETECTED Final   Serratia marcescens NOT DETECTED NOT DETECTED Final   Haemophilus influenzae NOT DETECTED NOT DETECTED Final   Neisseria meningitidis NOT DETECTED NOT DETECTED Final   Pseudomonas aeruginosa NOT DETECTED NOT DETECTED Final   Stenotrophomonas maltophilia NOT DETECTED NOT DETECTED Final   Candida albicans NOT DETECTED NOT DETECTED Final   Candida auris NOT DETECTED NOT DETECTED Final   Candida glabrata NOT DETECTED NOT DETECTED Final   Candida krusei NOT DETECTED NOT DETECTED Final   Candida parapsilosis NOT DETECTED NOT DETECTED Final   Candida tropicalis NOT DETECTED NOT DETECTED Final   Cryptococcus neoformans/gattii NOT DETECTED NOT DETECTED Final   CTX-M ESBL NOT DETECTED NOT DETECTED Final   Carbapenem resistance IMP NOT DETECTED NOT DETECTED Final   Carbapenem resistance KPC NOT DETECTED NOT DETECTED Final   Carbapenem resistance NDM NOT DETECTED NOT DETECTED Final   Carbapenem resist OXA 48 LIKE NOT DETECTED NOT DETECTED Final   Carbapenem resistance VIM NOT DETECTED NOT DETECTED Final    Comment: Performed at [facility], [address] [place], [place] [address]  MRSA PCR Screening     Status: Abnormal   Collection Time: 03/10/20  6:08 PM   Specimen: Nasopharyngeal  Result Value Ref Range Status   MRSA by PCR POSITIVE (A) NEGATIVE Final    Comment:        The GeneXpert MRSA Assay (FDA  approved for NASAL specimens only), is one component of  a comprehensive MRSA colonization surveillance program. It is not intended to diagnose MRSA infection nor to guide or monitor treatment for MRSA infections. RESULT CALLED TO, READ BACK BY AND VERIFIED WITH: NATALE,G RN AT 2105 03/10/2020 MITCHELL,L Performed at [facility], [place] [address], [place], [place] [address]   Culture, blood (routine x 2)     Status: None (Preliminary result)   Collection Time: 03/10/20  6:52 PM   Specimen: BLOOD LEFT HAND  Result Value Ref Range Status   Specimen Description BLOOD LEFT HAND  Final   Special Requests   Final    BOTTLES DRAWN AEROBIC AND ANAEROBIC Blood Culture results may not be optimal due to an inadequate volume of blood received in culture bottles   Culture   Final    NO GROWTH < 12 HOURS Performed at [facility], [place] [address] [place], [place] [address]    Report Status PENDING  Incomplete  Culture, blood (routine x 2)     Status: None (Preliminary result)   Collection Time: 03/10/20  7:05 PM   Specimen: BLOOD  Result Value Ref Range Status   Specimen Description BLOOD RIGHT THUMB  Final   Special Requests   Final    BOTTLES DRAWN AEROBIC ONLY Blood Culture results may not be optimal due to an inadequate volume of blood received in culture bottles   Culture   Final    NO GROWTH < 12 HOURS Performed at [facility], [place] [address], [place], [place] [address]    Report Status PENDING  Incomplete         Radiology Studies: CT ABDOMEN PELVIS WO CONTRAST  Result Date: 03/10/2020 CLINICAL DATA:  Nausea, vomiting, constipation and fever for 4 days. History of kidney and liver transplant. EXAM: CT ABDOMEN AND PELVIS WITHOUT CONTRAST TECHNIQUE: Multidetector CT imaging of the abdomen and pelvis was performed following the standard protocol without IV contrast. COMPARISON:  CT abdomen dated 02/22/2007. FINDINGS: Lower chest: No acute abnormality. Hepatobiliary: No focal liver abnormality. No bile duct dilatation.  Gallbladder not seen, presumed cholecystectomy. Pancreas: Unremarkable. No pancreatic ductal dilatation or surrounding inflammatory changes. Spleen: Stable splenomegaly. Adrenals/Urinary Tract: At least 2, probably 3 or 4, abutting stones within the mid LEFT ureter, L4-L5 vertebral body level, largest stone measuring 7 mm, causing moderate LEFT-sided hydronephrosis and associated perinephric edema. Benign-appearing cyst exophytic to the lower pole of the RIGHT kidney, measuring approximately 4 cm greatest dimension. No RIGHT-sided renal stone or hydronephrosis. No RIGHT-sided ureteral stone. Bladder is unremarkable, partially decompressed. Transplant kidney within the RIGHT pelvis appears normal, with expected mild pelviectasis. Stomach/Bowel: No dilated large or small bowel loops. No evidence of bowel wall inflammation. Appendix is normal. Vascular/Lymphatic: No significant vascular findings are present. No enlarged abdominal or pelvic lymph nodes. Reproductive: Prostate is unremarkable. Other: No free fluid or abscess collection. No free intraperitoneal air. Musculoskeletal: Degenerative spondylosis of the lumbar spine, mild to moderate in degree. No acute or suspicious osseous finding. Umbilical abdominal wall hernia which contains fat only. IMPRESSION: 1. At least 2, probably 3 or more, abutting stones within the mid LEFT ureter, largest stone measuring 7 mm, causing moderate LEFT-sided hydronephrosis and associated perinephric edema. 2. Transplant kidney within the RIGHT pelvis appears normal, with expected mild pelviectasis. 3. Stable splenomegaly. 4. Umbilical abdominal wall hernia which contains fat only. 5. Additional chronic/incidental findings detailed above. Electronically Signed   By: Roxy Horseman.D.  On: 03/10/2020 10:17   DG Retrograde Pyelogram  Result Date: 03/10/2020 CLINICAL DATA:  Portable fluoroscopic imaging for retrograde ureteral pyelography. EXAM: DG C-ARM 1-60 MIN; RETROGRADE  PYELOGRAM FLUOROSCOPY TIME:  Fluoroscopy Time:  0 minutes and 31 seconds Radiation Exposure Index (if provided by the fluoroscopic device): 9.3 mGy Number of Acquired Spot Images: 5 COMPARISON:  CT, 03/10/2020 FINDINGS: Images show the retrograde injection of the left ureter. There is an oval filling defect in the distal ureter with a more elongated filling defect in the proximal ureter. At least 1 filling defect is noted in a opacified left renal calyx. IMPRESSION: Imaging provided for left retrograde ureteral pyelography. Electronically Signed   By: Lajean Manes M.D.   On: 03/10/2020 17:09   DG Chest Portable 1 View  Result Date: 03/10/2020 CLINICAL DATA:  Central line placement. EXAM: PORTABLE CHEST 1 VIEW COMPARISON:  Radiographs 03/10/2020 and 09/14/2015. FINDINGS: 1314 hours. Right IJ central venous catheter projects to the level of the upper SVC. The heart size and mediastinal contours are stable. There is no pneumothorax or significant pleural effusion. There are persistent low lung volumes with mild pulmonary interstitial prominence. No focal airspace disease or edema. The bones appear unchanged. Telemetry leads overlie the chest. IMPRESSION: Central venous catheter placement as described. No pneumothorax or other significant changes. Electronically Signed   By: Richardean Sale M.D.   On: 03/10/2020 13:49   DG Chest Port 1 View  Result Date: 03/10/2020 CLINICAL DATA:  Questionable sepsis EXAM: PORTABLE CHEST 1 VIEW COMPARISON:  09/14/2015 FINDINGS: Normal heart size and mediastinal contours. Prominent lung markings at the bases, but stable. There is no edema, consolidation, effusion, or pneumothorax. IMPRESSION: Stable exam.  No acute finding. Electronically Signed   By: Monte Fantasia M.D.   On: 03/10/2020 08:03   DG C-Arm 1-60 Min  Result Date: 03/10/2020 CLINICAL DATA:  Portable fluoroscopic imaging for retrograde ureteral pyelography. EXAM: DG C-ARM 1-60 MIN; RETROGRADE PYELOGRAM FLUOROSCOPY  TIME:  Fluoroscopy Time:  0 minutes and 31 seconds Radiation Exposure Index (if provided by the fluoroscopic device): 9.3 mGy Number of Acquired Spot Images: 5 COMPARISON:  CT, 03/10/2020 FINDINGS: Images show the retrograde injection of the left ureter. There is an oval filling defect in the distal ureter with a more elongated filling defect in the proximal ureter. At least 1 filling defect is noted in a opacified left renal calyx. IMPRESSION: Imaging provided for left retrograde ureteral pyelography. Electronically Signed   By: Lajean Manes M.D.   On: 03/10/2020 17:09        Scheduled Meds: . Chlorhexidine Gluconate Cloth  6 each Topical Q0600  . enoxaparin (LOVENOX) injection  40 mg Subcutaneous Q24H  . hydrocortisone sod succinate (SOLU-CORTEF) inj  50 mg Intravenous Q6H  . insulin aspart  0-15 Units Subcutaneous Q4H  . insulin glargine  10 Units Subcutaneous QHS  . mupirocin ointment  1 application Nasal BID  . mycophenolate  500 mg Oral BID  . Tacrolimus ER  4 mg Oral Daily   Continuous Infusions: . ceFEPime (MAXIPIME) IV Stopped (03/11/20 2205)  . lactated ringers 150 mL/hr at 03/12/20 0700  . norepinephrine (LEVOPHED) Adult infusion Stopped (03/11/20 0959)  . sodium chloride     And  . sodium chloride       LOS: 2 days   Time spent= 35 mins    Lotus Santillo Arsenio Loader, MD [facility]  If 7PM-7AM, please contact night-coverage  03/12/2020, 7:40 AM

## 2020-03-12 NOTE — Progress Notes (Addendum)
Report called to RN on [phone]

## 2020-03-13 DIAGNOSIS — B9620 Unspecified Escherichia coli [E. coli] as the cause of diseases classified elsewhere: Secondary | ICD-10-CM

## 2020-03-13 DIAGNOSIS — N132 Hydronephrosis with renal and ureteral calculous obstruction: Secondary | ICD-10-CM

## 2020-03-13 DIAGNOSIS — U071 COVID-19: Secondary | ICD-10-CM

## 2020-03-13 DIAGNOSIS — R7881 Bacteremia: Secondary | ICD-10-CM

## 2020-03-13 DIAGNOSIS — N390 Urinary tract infection, site not specified: Secondary | ICD-10-CM

## 2020-03-13 LAB — BASIC METABOLIC PANEL
Anion gap: 10 (ref 5–15)
BUN: 38 mg/dL — ABNORMAL HIGH (ref 8–23)
CO2: 20 mmol/L — ABNORMAL LOW (ref 22–32)
Calcium: 7.9 mg/dL — ABNORMAL LOW (ref 8.9–10.3)
Chloride: 112 mmol/L — ABNORMAL HIGH (ref 98–111)
Creatinine, Ser: 1.68 mg/dL — ABNORMAL HIGH (ref 0.61–1.24)
GFR calc Af Amer: 48 mL/min — ABNORMAL LOW (ref >60)
GFR calc non Af Amer: 41 mL/min — ABNORMAL LOW (ref >60)
Glucose, Bld: 84 mg/dL (ref 70–99)
Potassium: 2.8 mmol/L — ABNORMAL LOW (ref 3.5–5.1)
Sodium: 142 mmol/L (ref 135–145)

## 2020-03-13 LAB — MAGNESIUM: Magnesium: 1.2 mg/dL — ABNORMAL LOW (ref 1.7–2.4)

## 2020-03-13 LAB — CBC
HCT: 30.7 % — ABNORMAL LOW (ref 39.0–52.0)
Hemoglobin: 10 g/dL — ABNORMAL LOW (ref 13.0–17.0)
MCH: 24.3 pg — ABNORMAL LOW (ref 26.0–34.0)
MCHC: 32.6 g/dL (ref 30.0–36.0)
MCV: 74.7 fL — ABNORMAL LOW (ref 80.0–100.0)
Platelets: 68 10*3/uL — ABNORMAL LOW (ref 150–400)
RBC: 4.11 MIL/uL — ABNORMAL LOW (ref 4.22–5.81)
RDW: 16.9 % — ABNORMAL HIGH (ref 11.5–15.5)
WBC: 7 10*3/uL (ref 4.0–10.5)
nRBC: 0 % (ref 0.0–0.2)

## 2020-03-13 LAB — GLUCOSE, CAPILLARY
Glucose-Capillary: 122 mg/dL — ABNORMAL HIGH (ref 70–99)
Glucose-Capillary: 127 mg/dL — ABNORMAL HIGH (ref 70–99)
Glucose-Capillary: 64 mg/dL — ABNORMAL LOW (ref 70–99)
Glucose-Capillary: 65 mg/dL — ABNORMAL LOW (ref 70–99)
Glucose-Capillary: 73 mg/dL (ref 70–99)
Glucose-Capillary: 79 mg/dL (ref 70–99)
Glucose-Capillary: 91 mg/dL (ref 70–99)
Glucose-Capillary: 96 mg/dL (ref 70–99)

## 2020-03-13 MED ORDER — MAGNESIUM SULFATE 4 GM/100ML IV SOLN
4.0000 g | Freq: Once | INTRAVENOUS | Status: AC
  Administered 2020-03-13: 4 g via INTRAVENOUS
  Filled 2020-03-13: qty 100

## 2020-03-13 MED ORDER — POTASSIUM CHLORIDE CRYS ER 20 MEQ PO TBCR
40.0000 meq | EXTENDED_RELEASE_TABLET | Freq: Once | ORAL | Status: AC
  Administered 2020-03-13: 40 meq via ORAL
  Filled 2020-03-13: qty 2

## 2020-03-13 MED ORDER — POTASSIUM CHLORIDE 10 MEQ/100ML IV SOLN
10.0000 meq | INTRAVENOUS | Status: AC
  Administered 2020-03-13 (×5): 10 meq via INTRAVENOUS
  Filled 2020-03-13 (×5): qty 100

## 2020-03-13 MED ORDER — CEFAZOLIN SODIUM-DEXTROSE 2-4 GM/100ML-% IV SOLN
2.0000 g | Freq: Three times a day (TID) | INTRAVENOUS | Status: DC
  Administered 2020-03-13 – 2020-03-17 (×11): 2 g via INTRAVENOUS
  Filled 2020-03-13 (×14): qty 100

## 2020-03-13 MED ORDER — POTASSIUM CHLORIDE 10 MEQ/100ML IV SOLN
10.0000 meq | INTRAVENOUS | Status: DC
  Administered 2020-03-13: 10 meq via INTRAVENOUS
  Filled 2020-03-13: qty 100

## 2020-03-13 NOTE — Progress Notes (Addendum)
PROGRESS NOTE    [name]  ZRA:[id number] DOB: 10-18-1950 DOA: 03/10/2020 PCP: Nicoletta Dress, MD   Brief Narrative:   69 year old with history of DM2, status post liver and renal transplant, history of [name] cirrhosis, morbid obesity and peripheral neuropathy.  Patient presented to the ED with fevers chills with septic shock secondary to urinary tract infection.  Pt had a left-sided renal stone with hydronephrosis, underwent ureteral stent placement by urology on 9/5. Patient tested positive for COVID-19 on 9/4. Patient was empirically started on cefepime, both urine and blood cultures were positive for gram-negative rods.  Assessment & Plan:   Active Problems:   Acute renal failure superimposed on stage 3 chronic kidney disease (HCC)   Controlled type 2 diabetes mellitus with diabetic nephropathy, without long-term current use of insulin (HCC)   Diabetic neuropathy (HCC)   Septic shock (HCC)   Renal transplant recipient   Liver transplanted ([place])   Sepsis secondary to UTI ([place])   Bacteremia due to Gram-negative bacteria   Septic shock secondary to urinary tract infection, gram-negative bacteremia - Vital signs stable, no longer on Levophed. - Blood and urine cultures positive for E. Coli - Rocephin 2g IV q24hrs.  Left-sided renal stone with hydronephrosis, status stent placement 9/5 -  Rocephin 2g IV q24hrs. -  Maintain Foley catheter, urology following. -  Cortisol-Am 67.3, following surgery, transition to home dose of  5 mg prednisone p.o.  COVID-19 infection -Pt had been able to maintain oxygen saturations at 98-100% RA. -Chest X-ray shows no infiltrates.  -Patient was vaccinated for COVID 19 ~3 months ago.  -Supportive care provided with antitussives (Mucinex), inhalers,   incentive spirometer and flutter valve. -OOB to chair. -Obtain ambulatory pulse ox.  History of renal and liver transplant and [name] cirrhosis -Currently on prednisone, tacrolimus, and  Cellcept, which are immunosuppressants.  Diabetes mellitus type 2 - Hgb A1c 6.0 - Sliding scale insulin with Accuchecks - Lantus 10 units daily  Peripheral neuropathy secondary to DM2 - Follow plan for DM2 - Accuchecks, SSI, Lantus   DVT prophylaxis: Lovenox Code Status: Full Code Family Communication:  Wife [name] updated.  Status is: Inpatient   Dispo: The patient is from: Home              Anticipated d/c is to: Home              Anticipated d/c date is: 2 days              Patient currently is not medically stable to d/c.    Body mass index is 32.08 kg/m.  Pressure Injury 03/10/20 Sacrum Medial Stage II -  Partial thickness loss of dermis presenting as a shallow open ulcer with a red, pink wound bed without slough. small area of non-intact skin in middle of sacrum surrounded by pink blanchable skin (Active)  03/10/20 1920  Location: Sacrum  Location Orientation: Medial  Staging: Stage II -  Partial thickness loss of dermis presenting as a shallow open ulcer with a red, pink wound bed without slough.  Wound Description (Comments): small area of non-intact skin in middle of sacrum surrounded by pink blanchable skin  Present on Admission: Yes     Pressure Ulcer 09/14/15 Stage I -  Intact skin with non-blanchable redness of a localized area usually over a bony prominence. (Active)  09/14/15 2030  Location: Ischial tuberosity  Location Orientation: Left;Right  Staging: Stage I -  Intact skin with non-blanchable redness of a localized area  usually over a bony prominence.  Wound Description (Comments):   Present on Admission: Yes     Consultants:   Urology  Procedures:   S/p ureteral stent placement by urology on 9/5.  Antimicrobials:  Cefazolin/Ancef  Subjective:  Patient denies shortness of breath, but has not gotten out of bed or ambulated.   Review of Systems Otherwise negative except as per HPI, including: General: Denies fever, chills, night sweats  or unintended weight loss. Resp: Denies cough, wheezing, shortness of breath. Cardiac: Denies chest pain, palpitations, orthopnea, paroxysmal nocturnal dyspnea. GI: Denies abdominal pain, nausea, vomiting, diarrhea or constipation GU: Denies dysuria, frequency, hesitancy or incontinence MS: Denies muscle aches, joint pain or swelling Neuro: Denies headache, neurologic deficits (focal weakness, numbness, tingling), abnormal gait Psych: Denies anxiety, depression, SI/HI/AVH Skin: Denies new rashes or lesions ID: Denies sick contacts, exotic exposures, travel  Examination:  General exam: Appears calm and comfortable  Respiratory system: Clear to auscultation. Respiratory effort normal. Cardiovascular system: S1 & S2 heard, RRR. No JVD, murmurs, rubs, gallops or clicks. No pedal edema. Gastrointestinal system: Abdomen is nondistended, soft and nontender. No organomegaly or masses felt. Normal bowel sounds heard. Central nervous system: Alert and oriented. No focal neurological deficits. Extremities: Symmetric 5 x 5 power. Skin: No rashes, lesions or ulcers Psychiatry: Judgement and insight appear normal. Mood & affect appropriate.   Foley Catheter in place.  Objective: Vitals:   03/12/20 1647 03/12/20 2000 03/13/20 0400 03/13/20 0742  BP: (!) 153/80 (!) 153/79 126/63   Pulse: 69 66 60   Resp: 18 16 18    Temp: 98.6 F (37 C) 98.2 F (36.8 C) 98.1 F (36.7 C) 97.7 F (36.5 C)  TempSrc: Oral Oral Oral Oral  SpO2: 100% 100% 100% 98%  Weight:      Height:        Intake/Output Summary (Last 24 hours) at 03/13/2020 1217 Last data filed at 03/13/2020 0901 Gross per 24 hour  Intake 2018.49 ml  Output 500 ml  Net 1518.49 ml   Filed Weights   03/10/20 0730 03/10/20 1800  Weight: 102.5 kg 101.4 kg     Data Reviewed:   CBC: Recent Labs  Lab 03/10/20 0815 03/10/20 1058 03/10/20 1423 03/12/20 1154 03/13/20 0520  WBC 3.9*  --  25.0* 10.2 7.0  NEUTROABS 3.7  --   --   --   --    HGB 13.9 12.2* 12.4* 10.7* 10.0*  HCT 45.1 36.0* 40.7 34.1* 30.7*  MCV 74.9*  --  79.3* 74.5* 74.7*  PLT 95*  --  105* 71* 68*   Basic Metabolic Panel: Recent Labs  Lab 03/10/20 0815 03/10/20 1058 03/10/20 1423 03/12/20 1154 03/13/20 0520  NA 139 145  --  143 142  K 4.0 3.2*  --  3.4* 2.8*  CL 108  --   --  114* 112*  CO2 18*  --   --  18* 20*  GLUCOSE 109*  --   --  97 84  BUN 32*  --   --  43* 38*  CREATININE 2.50*  --  2.74* 1.94* 1.68*  CALCIUM 8.8*  --   --  8.0* 7.9*  MG  --   --   --  1.3* 1.2*   GFR: Estimated Creatinine Clearance: 50.9 mL/min (A) (by C-G formula based on SCr of 1.68 mg/dL (H)). Liver Function Tests: Recent Labs  Lab 03/10/20 0815  AST 31  ALT 19  ALKPHOS 82  BILITOT 1.0  PROT 6.3*  ALBUMIN 3.1*   Recent Labs  Lab 03/10/20 0801  LIPASE 34   Recent Labs  Lab 03/10/20 0801  AMMONIA 48*   Coagulation Profile: Recent Labs  Lab 03/10/20 0815 03/11/20 1242  INR 1.2 1.4*   Cardiac Enzymes: No results for input(s): CKTOTAL, CKMB, CKMBINDEX, TROPONINI in the last 168 hours. BNP (last 3 results) No results for input(s): PROBNP in the last 8760 hours. HbA1C: Recent Labs    03/10/20 1822  HGBA1C 6.0*   CBG: Recent Labs  Lab 03/12/20 2348 03/13/20 0023 03/13/20 0408 03/13/20 0442 03/13/20 0756  GLUCAP 66* 73 65* 79 64*   Lipid Profile: No results for input(s): CHOL, HDL, LDLCALC, TRIG, CHOLHDL, LDLDIRECT in the last 72 hours. Thyroid Function Tests: No results for input(s): TSH, T4TOTAL, FREET4, T3FREE, THYROIDAB in the last 72 hours. Anemia Panel: No results for input(s): VITAMINB12, FOLATE, FERRITIN, TIBC, IRON, RETICCTPCT in the last 72 hours. Sepsis Labs: Recent Labs  Lab 03/10/20 0801 03/10/20 1047 03/11/20 1242  PROCALCITON  --   --  45.15  LATICACIDVEN 3.2* 3.7*  --     Recent Results (from the past 240 hour(s))  SARS Coronavirus 2 by RT PCR (hospital order, performed in [facility] hospital lab)  Nasopharyngeal Nasopharyngeal Swab     Status: Abnormal   Collection Time: 03/10/20  7:40 AM   Specimen: Nasopharyngeal Swab  Result Value Ref Range Status   SARS Coronavirus 2 POSITIVE (A) NEGATIVE Final    Comment: RESULT CALLED TO, READ BACK BY AND VERIFIED WITH: A,MCKOWN RN @1149  03/10/20 EB (NOTE) SARS-CoV-2 target nucleic acids are DETECTED  SARS-CoV-2 RNA is generally detectable in upper respiratory specimens  during the acute phase of infection.  Positive results are indicative  of the presence of the identified virus, but do not rule out bacterial infection or co-infection with other pathogens not detected by the test.  Clinical correlation with patient history and  other diagnostic information is necessary to determine patient infection status.  The expected result is negative.  Fact Sheet for Patients:   StrictlyIdeas.no   Fact Sheet for Healthcare Providers:   BankingDealers.co.za    This test is not yet approved or cleared by the [place] FDA and  has been authorized for detection and/or diagnosis of SARS-CoV-2 by FDA under an Emergency Use Authorization (EUA).  This EUA will remain in effect (meaning this test can  be used) for the duration of  the COVID-19 declaration under Section 564(b)(1) of the Act, 21 U.S.C. section 360-bbb-3(b)(1), unless the authorization is terminated or revoked sooner.  Performed at [facility], [place] [address], [place], [place] [address]   Blood Culture (routine x 2)     Status: Abnormal   Collection Time: 03/10/20  8:01 AM   Specimen: BLOOD  Result Value Ref Range Status   Specimen Description BLOOD SITE NOT SPECIFIED  Final   Special Requests   Final    BOTTLES DRAWN AEROBIC AND ANAEROBIC Blood Culture adequate volume   Culture  Setup Time   Final    GRAM NEGATIVE COCCI AEROBIC BOTTLE ONLY CRITICAL VALUE NOTED.  VALUE IS CONSISTENT WITH PREVIOUSLY REPORTED AND CALLED  VALUE. Performed at [facility], [place] [address] [place], [place] [address]    Culture ESCHERICHIA COLI (A)  Final   Report Status 03/12/2020 FINAL  Final   Organism ID, Bacteria ESCHERICHIA COLI  Final      Susceptibility   Escherichia coli - MIC*    AMPICILLIN  4 SENSITIVE Sensitive     CEFAZOLIN <=4 SENSITIVE Sensitive     CEFEPIME <=0.12 SENSITIVE Sensitive     CEFTAZIDIME <=1 SENSITIVE Sensitive     CEFTRIAXONE <=0.25 SENSITIVE Sensitive     CIPROFLOXACIN <=0.25 SENSITIVE Sensitive     GENTAMICIN <=1 SENSITIVE Sensitive     IMIPENEM <=0.25 SENSITIVE Sensitive     TRIMETH/SULFA <=20 SENSITIVE Sensitive     AMPICILLIN/SULBACTAM <=2 SENSITIVE Sensitive     PIP/TAZO <=4 SENSITIVE Sensitive     * ESCHERICHIA COLI  Urine culture     Status: Abnormal   Collection Time: 03/10/20 11:25 AM   Specimen: In/Out Cath Urine  Result Value Ref Range Status   Specimen Description IN/OUT CATH URINE  Final   Special Requests   Final    NONE Performed at [facility], [place] [address] [place], [place] [address]    Culture >=100,000 COLONIES/mL ESCHERICHIA COLI (A)  Final   Report Status 03/12/2020 FINAL  Final   Organism ID, Bacteria ESCHERICHIA COLI (A)  Final      Susceptibility   Escherichia coli - MIC*    AMPICILLIN 4 SENSITIVE Sensitive     CEFAZOLIN <=4 SENSITIVE Sensitive     CEFTRIAXONE <=0.25 SENSITIVE Sensitive     CIPROFLOXACIN <=0.25 SENSITIVE Sensitive     GENTAMICIN <=1 SENSITIVE Sensitive     IMIPENEM <=0.25 SENSITIVE Sensitive     NITROFURANTOIN <=16 SENSITIVE Sensitive     TRIMETH/SULFA <=20 SENSITIVE Sensitive     AMPICILLIN/SULBACTAM 4 SENSITIVE Sensitive     PIP/TAZO <=4 SENSITIVE Sensitive     * >=100,000 COLONIES/mL ESCHERICHIA COLI  Blood Culture (routine x 2)     Status: Abnormal   Collection Time: 03/10/20  2:28 PM   Specimen: BLOOD RIGHT HAND  Result Value Ref Range Status   Specimen Description BLOOD RIGHT HAND  Final   Special Requests   Final     BOTTLES DRAWN AEROBIC AND ANAEROBIC Blood Culture results may not be optimal due to an inadequate volume of blood received in culture bottles   Culture  Setup Time   Final    GRAM NEGATIVE RODS ANAEROBIC BOTTLE ONLY CRITICAL RESULT CALLED TO, READ BACK BY AND VERIFIED WITH: PHARMD LAURA SEAY AT 2348 BY MESSAN H. ON 03/10/2020    Culture (A)  Final    ESCHERICHIA COLI SUSCEPTIBILITIES PERFORMED ON PREVIOUS CULTURE WITHIN THE LAST 5 DAYS. Performed at [facility], [place] [address] [place], [place] [address]    Report Status 03/12/2020 FINAL  Final  Blood Culture ID Panel (Reflexed)     Status: Abnormal   Collection Time: 03/10/20  2:28 PM  Result Value Ref Range Status   Enterococcus faecalis NOT DETECTED NOT DETECTED Final   Enterococcus Faecium NOT DETECTED NOT DETECTED Final   Listeria monocytogenes NOT DETECTED NOT DETECTED Final   Staphylococcus species NOT DETECTED NOT DETECTED Final   Staphylococcus aureus (BCID) NOT DETECTED NOT DETECTED Final   Staphylococcus epidermidis NOT DETECTED NOT DETECTED Final   Staphylococcus lugdunensis NOT DETECTED NOT DETECTED Final   Streptococcus species NOT DETECTED NOT DETECTED Final   Streptococcus agalactiae NOT DETECTED NOT DETECTED Final   Streptococcus pneumoniae NOT DETECTED NOT DETECTED Final   Streptococcus pyogenes NOT DETECTED NOT DETECTED Final   A.calcoaceticus-baumannii NOT DETECTED NOT DETECTED Final   Bacteroides fragilis NOT DETECTED NOT DETECTED Final   Enterobacterales DETECTED (A) NOT DETECTED Final    Comment: Enterobacterales represent a large order of gram negative  bacteria, not a single organism. CRITICAL RESULT CALLED TO, READ BACK BY AND VERIFIED WITH: PHARMD LAURA SEAY AT 2348 BY MESSAN H. ON 03/10/2020    Enterobacter cloacae complex NOT DETECTED NOT DETECTED Final   Escherichia coli DETECTED (A) NOT DETECTED Final    Comment: CRITICAL RESULT CALLED TO, READ BACK BY AND VERIFIED WITH: PHARMD LAURA SEAY AT  2348 BY MESSAN H. ON 03/10/2020    Klebsiella aerogenes NOT DETECTED NOT DETECTED Final   Klebsiella oxytoca NOT DETECTED NOT DETECTED Final   Klebsiella pneumoniae NOT DETECTED NOT DETECTED Final   Proteus species NOT DETECTED NOT DETECTED Final   Salmonella species NOT DETECTED NOT DETECTED Final   Serratia marcescens NOT DETECTED NOT DETECTED Final   Haemophilus influenzae NOT DETECTED NOT DETECTED Final   Neisseria meningitidis NOT DETECTED NOT DETECTED Final   Pseudomonas aeruginosa NOT DETECTED NOT DETECTED Final   Stenotrophomonas maltophilia NOT DETECTED NOT DETECTED Final   Candida albicans NOT DETECTED NOT DETECTED Final   Candida auris NOT DETECTED NOT DETECTED Final   Candida glabrata NOT DETECTED NOT DETECTED Final   Candida krusei NOT DETECTED NOT DETECTED Final   Candida parapsilosis NOT DETECTED NOT DETECTED Final   Candida tropicalis NOT DETECTED NOT DETECTED Final   Cryptococcus neoformans/gattii NOT DETECTED NOT DETECTED Final   CTX-M ESBL NOT DETECTED NOT DETECTED Final   Carbapenem resistance IMP NOT DETECTED NOT DETECTED Final   Carbapenem resistance KPC NOT DETECTED NOT DETECTED Final   Carbapenem resistance NDM NOT DETECTED NOT DETECTED Final   Carbapenem resist OXA 48 LIKE NOT DETECTED NOT DETECTED Final   Carbapenem resistance VIM NOT DETECTED NOT DETECTED Final    Comment: Performed at [facility], [address], [place], [place] [address]  MRSA PCR Screening     Status: Abnormal   Collection Time: 03/10/20  6:08 PM   Specimen: Nasopharyngeal  Result Value Ref Range Status   MRSA by PCR POSITIVE (A) NEGATIVE Final    Comment:        The GeneXpert MRSA Assay (FDA approved for NASAL specimens only), is one component of a comprehensive MRSA colonization surveillance program. It is not intended to diagnose MRSA infection nor to guide or monitor treatment for MRSA infections. RESULT CALLED TO, READ BACK BY AND VERIFIED WITH: NATALE,G RN AT 2105  03/10/2020 MITCHELL,L Performed at [facility], [place] [address], [place], [place] [address]   Culture, blood (routine x 2)     Status: None (Preliminary result)   Collection Time: 03/10/20  6:52 PM   Specimen: BLOOD LEFT HAND  Result Value Ref Range Status   Specimen Description BLOOD LEFT HAND  Final   Special Requests   Final    BOTTLES DRAWN AEROBIC AND ANAEROBIC Blood Culture results may not be optimal due to an inadequate volume of blood received in culture bottles   Culture   Final    NO GROWTH 3 DAYS Performed at [facility], [place] [address], [place], [place] [address]    Report Status PENDING  Incomplete  Culture, blood (routine x 2)     Status: None (Preliminary result)   Collection Time: 03/10/20  7:05 PM   Specimen: BLOOD  Result Value Ref Range Status   Specimen Description BLOOD RIGHT THUMB  Final   Special Requests   Final    BOTTLES DRAWN AEROBIC ONLY Blood Culture results may not be optimal due to an inadequate volume of blood received in culture bottles  Culture   Final    NO GROWTH 3 DAYS Performed at [facility], [place] [address] [place], [place] [address]    Report Status PENDING  Incomplete         Radiology Studies: No results found.      Scheduled Meds:  aspirin EC  81 mg Oral Daily   Chlorhexidine Gluconate Cloth  6 each Topical Q0600   enoxaparin (LOVENOX) injection  40 mg Subcutaneous Q24H   insulin aspart  0-15 Units Subcutaneous Q4H   mupirocin ointment  1 application Nasal BID   mycophenolate  500 mg Oral BID   pantoprazole  40 mg Oral Daily   predniSONE  5 mg Oral Daily   pregabalin  100 mg Oral QHS   pregabalin  50 mg Oral 2 times per day   sertraline  50 mg Oral Daily   Tacrolimus ER  4 mg Oral Daily   traZODone  50 mg Oral QHS   Continuous Infusions:   ceFAZolin (ANCEF) IV     lactated ringers 100 mL/hr at 03/13/20 0300   potassium chloride     sodium chloride     And   sodium chloride         LOS: 3 days   Time spent= 35 mins   Dede Query, RN NP student   If 7PM-7AM, please contact night-coverage  03/13/2020, 12:17 PM

## 2020-03-13 NOTE — Evaluation (Addendum)
Physical Therapy Evaluation Patient Details Name: [name] MRN: [id number] DOB: 10-22-1950 Today's Date: 03/13/2020   History of Present Illness  69 year old with history of DM2, status post liver and renal transplant, history of [name] cirrhosis, peripheral neuropathy, morbid obesity presented to the hospital with fevers chills found to be in septic shock secondary to urinary tract infection.  Had left-sided renal stone with hydronephrosis, underwent ureteral stent placement by urology on 9/5.  He was tested positive for COVID-19 on 9/4.   Clinical Impression  Pt presents to PT at baseline level of mobility. Pt has needed equipment at home. Recommend return home with family and follow up PT with prior provider. Will not follow acutely. Recommend OOB with mechanical lift by nursing while here. Pt did not require supplemental O2 and SpO2 remained >97% with his activity.     Follow Up Recommendations Other (comment) (PT with prior provider ?[facility])    Equipment Recommendations  None recommended by PT    Recommendations for Other Services       Precautions / Restrictions Precautions Precautions: Fall Precaution Comments: reports he hasnt walked in a few months      Mobility  Bed Mobility Overal bed mobility: Needs Assistance Bed Mobility: Supine to Sit;Sit to Sidelying     Supine to sit: Max assist;HOB elevated   Sit to sidelying: Mod assist General bed mobility comments: Assist to elevate trunk into sitting and bring hips to EOB. Assist to bring legs back into the bed.   Transfers Overall transfer level: Needs assistance               General transfer comment: did not attempt pt reports hoyer lift at home for transfers  Ambulation/Gait                Stairs            Wheelchair Mobility    Modified Rankin (Stroke Patients Only)       Balance Overall balance assessment: Needs assistance Sitting-balance support: Bilateral upper extremity  supported;Feet supported Sitting balance-Leahy Scale: Poor Sitting balance - Comments: Sat EOB x 10 minutes with min guard assist and bilateral upper extremeties Postural control: Posterior lean                                   Pertinent Vitals/Pain Pain Assessment: No/denies pain    Home Living Family/patient expects to be discharged to:: Private residence Living Arrangements: Spouse/significant other Available Help at Discharge: Family Type of Home: House       Home Layout: Multi-level (has been staying in basement with level entry) Home Equipment: Shower seat - built in;Wheelchair - manual;Bedside commode;Walker - 2 wheels;Hospital bed;Other (comment) (Hoyer lift) Additional Comments: Pt reports being unable to walk for 3 months after neck surgery. Has been staying in basement with level entry. Full kitchen, bathroom, bedroom in basement    Prior Function Level of Independence: Needs assistance   Gait / Transfers Assistance Needed: Pt reports using hoyer lift since neck surgery several months ago  ADL's / Homemaking Assistance Needed: Wife assists with IADLs. Pt reports able to bathe/dress self usually.  Unsure of accuracy   Comments: Pt receiving PT at [facility] vs [facility].     Hand Dominance   Dominant Hand: Right    Extremity/Trunk Assessment   Upper Extremity Assessment Upper Extremity Assessment: Defer to OT evaluation    Lower Extremity Assessment Lower Extremity Assessment:  RLE deficits/detail;LLE deficits/detail RLE Deficits / Details: strength 3-/5 LLE Deficits / Details: strength 3-/5    Cervical / Trunk Assessment Cervical / Trunk Assessment: Other exceptions Cervical / Trunk Exceptions: forward head  Communication   Communication: No difficulties  Cognition Arousal/Alertness: Awake/alert Behavior During Therapy: Flat affect Overall Cognitive Status: No family/caregiver present to determine baseline cognitive functioning                                  General Comments: Follows 1 step commands. Reports seeing 2 men in the trees outside his window.       General Comments General comments (skin integrity, edema, etc.): Pt on RA with SpO2 98-100%    Exercises     Assessment/Plan    PT Assessment All further PT needs can be met in the next venue of care  PT Problem List Decreased strength;Decreased balance;Decreased mobility;Obesity       PT Treatment Interventions      PT Goals (Current goals can be found in the Care Plan section)  Acute Rehab PT Goals Patient Stated Goal: be able to walk again PT Goal Formulation: All assessment and education complete, DC therapy    Frequency     Barriers to discharge        Co-evaluation               AM-PAC PT "6 Clicks" Mobility  Outcome Measure Help needed turning from your back to your side while in a flat bed without using bedrails?: Total Help needed moving from lying on your back to sitting on the side of a flat bed without using bedrails?: Total Help needed moving to and from a bed to a chair (including a wheelchair)?: Total Help needed standing up from a chair using your arms (e.g., wheelchair or bedside chair)?: Total Help needed to walk in hospital room?: Total Help needed climbing 3-5 steps with a railing? : Total 6 Click Score: 6    End of Session   Activity Tolerance: Patient tolerated treatment well Patient left: in bed;with call bell/phone within reach;with bed alarm set Nurse Communication: Mobility status PT Visit Diagnosis: Other abnormalities of gait and mobility (R26.89);Difficulty in walking, not elsewhere classified (R26.2)    Time: 9432-7614 PT Time Calculation (min) (ACUTE ONLY): 22 min   Charges:   PT Evaluation $PT Eval Moderate Complexity: [place] Pager [phone] Office [place] 03/13/2020, 4:40 PM

## 2020-03-13 NOTE — Evaluation (Signed)
Occupational Therapy Evaluation Patient Details Name: [name] MRN: [id number] DOB: 1951/01/23 Today's Date: 03/13/2020    History of Present Illness 69 year old with history of DM2, status post liver and renal transplant, history of [name] cirrhosis, peripheral neuropathy, morbid obesity presented to the hospital with fevers chills found to be in septic shock secondary to urinary tract infection.  Had left-sided renal stone with hydronephrosis, underwent ureteral stent placement by urology on 9/5.  He was tested positive for COVID-19 on 9/4.    Clinical Impression   PTA, pt lives with wife and reports being wheelchair bound for at least 3 months. Wife assists with bed mobility, transfers (has Hoyer lift at home), IADLs and some ADLs per pt report. Pt presents now with deficits in strength, sitting balance, cognition, and endurance requiring Min A for UB ADLs and Total A for LB ADLs. Pt Max A for bed mobility with poor sitting balance and unable to sustain without heavy use of bed rail and physical assist. Unable to assess transfer abilities due to poor sitting balance and without 2 person assist. Unsure if wife physically able to assist pt at this level at home, so recommend SNF for short term rehab. If wife comfortable physically assisting pt at this level, recommend resuming [place] therapy services. VSS on RA.     Follow Up Recommendations  SNF;Supervision/Assistance - 24 hour (HHOT if 24/7 physical assist available)    Equipment Recommendations  None recommended by OT    Recommendations for Other Services       Precautions / Restrictions Precautions Precautions: Fall Precaution Comments: reports he hasnt walked in a few months Restrictions Weight Bearing Restrictions: No      Mobility Bed Mobility Overal bed mobility: Needs Assistance Bed Mobility: Supine to Sit     Supine to sit: Max assist;HOB elevated     General bed mobility comments: Max A with heavy reliance on bed rail  and inability to gain and maintain sitting balance  Transfers Overall transfer level: Needs assistance               General transfer comment: unable with 1 person assist    Balance Overall balance assessment: Needs assistance Sitting-balance support: Bilateral upper extremity supported;Feet supported Sitting balance-Leahy Scale: Poor Sitting balance - Comments: difficulty maintaining balance with posterior lean and falling back while attempting to hold self with bed rails Postural control: Posterior lean                                 ADL either performed or assessed with clinical judgement   ADL Overall ADL's : Needs assistance/impaired Eating/Feeding: Set up;Sitting Eating/Feeding Details (indicate cue type and reason): Setup for breakfast, assistance to open packaging and reach items on tray Grooming: Set up;Sitting   Upper Body Bathing: Minimal assistance;Sitting   Lower Body Bathing: Sitting/lateral leans;Maximal assistance   Upper Body Dressing : Minimal assistance;Sitting   Lower Body Dressing: Sitting/lateral leans;Total assistance Lower Body Dressing Details (indicate cue type and reason): Total A to don socks     Toileting- Clothing Manipulation and Hygiene: Total assistance;Bed level         General ADL Comments: Pt with recent decreased BLE strengh, decreased endurance impacting ability to complete LB ADLs without increased assistance     Vision Baseline Vision/History: Wears glasses Wears Glasses: At all times Patient Visual Report: No change from baseline Vision Assessment?: No apparent visual deficits  Perception     Praxis      Pertinent Vitals/Pain Pain Assessment: No/denies pain     Hand Dominance Right   Extremity/Trunk Assessment Upper Extremity Assessment Upper Extremity Assessment: RUE deficits/detail RUE Deficits / Details: 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 Pt answered orientation questions appropriately but with noted difficulty problem solving, delayed processing.    General Comments  Pt on RA and VSS    Exercises     Shoulder Instructions      Home Living Family/patient expects to be discharged to:: Private residence Living Arrangements: Spouse/significant other Available Help at Discharge: Family Type of Home: House       Home Layout: Multi-level (has been staying in basement with level entry)     Bathroom Shower/Tub: Occupational psychologist: Standard     Home Equipment: Shower seat - built in;Wheelchair - manual;Bedside commode;Walker - 2 wheels;Hospital bed;Other (comment) (Hoyer lift)   Additional Comments: Pt reports being unable to walk for 3 months after neck surgery. Has been staying in basement with level entry. Full kitchen, bathroom, bedroom in basement      Prior Functioning/Environment Level of Independence: Needs assistance  Gait / Transfers Assistance Needed: Pt says wife assists with bed mobility and transfers (assuming with [facility] lift). Pt was using wheelchair for mobility in recent 3 months ADL's / Homemaking Assistance Needed: Wife assists with IADLs. Pt reports able to bathe/dress self usually.  Unsure of accuracy    Comments: Pt was receiving HH therapy services PTA        OT Problem List: Decreased strength;Decreased activity tolerance;Impaired balance (sitting and/or standing);Decreased  cognition;Decreased safety awareness;Decreased knowledge of use of DME or AE      OT Treatment/Interventions: Self-care/ADL training;Therapeutic exercise;Energy conservation;DME and/or AE instruction;Therapeutic activities;Patient/family education    OT Goals(Current goals can be found in the care plan section) Acute Rehab OT Goals Patient Stated Goal: be able to walk again OT Goal Formulation: With patient Time For Goal Achievement: 03/27/20 Potential to Achieve Goals: Fair ADL Goals Pt Will Perform Upper Body Bathing: with set-up;sitting Pt Will Perform Lower Body Bathing: with min assist;sitting/lateral leans Pt Will Transfer to Toilet: with mod assist;bedside commode Pt Will Perform Toileting - Clothing Manipulation and hygiene: with mod assist;sitting/lateral leans Additional ADL Goal #1: Pt will demo ability to maintain sitting balance EOB 5 minutes with no more than Min guard for ADLs  OT Frequency: Min 2X/week   Barriers to D/C:            Co-evaluation              AM-PAC OT "6 Clicks" Daily Activity     Outcome Measure Help from another person eating meals?: A Little Help from another person taking care of personal grooming?: A Little Help from another person toileting, which includes using toliet, bedpan, or urinal?: Total Help from another person bathing (including washing, rinsing,  drying)?: Total Help from another person to put on and taking off regular upper body clothing?: A Little Help from another person to put on and taking off regular lower body clothing?: Total 6 Click Score: 12   End of Session Nurse Communication: Mobility status  Activity Tolerance: Patient limited by fatigue Patient left: in bed;with call bell/phone within reach;with bed alarm set  OT Visit Diagnosis: Unsteadiness on feet (R26.81);Other abnormalities of gait and mobility (R26.89);Muscle weakness (generalized) (M62.81)                Time: 2481-8590 OT Time Calculation (min): 46  min Charges:  OT General Charges $OT Visit: 1 Visit OT Evaluation $OT Eval Moderate Complexity: 1 Mod OT Treatments $Self Care/Home Management : 8-22 mins $Therapeutic Activity: 8-22 mins  Layla Maw, OTR/L  Layla Maw 03/13/2020, 9:10 AM

## 2020-03-13 NOTE — Progress Notes (Signed)
Hypoglycemic Event  CBG:65  Treatment: 4 oz juice/soda  Symptoms: None  Follow-up CBG: [id number] CBG Result:79  Possible Reasons for Event: Inadequate meal intake  Comments/MD notified:    [name]

## 2020-03-13 NOTE — TOC Initial Note (Signed)
Transition of Care [facility]) - Initial/Assessment Note    Patient Details  Name: [name] MRN: [id number] Date of Birth: 11-26-1950  [facility] CM/SW Contact:    Benard Halsted, LCSW Phone Number: 03/13/2020, 12:21 PM  Clinical Narrative:                 CSW received call from Shanon Brow [phone]) with [facility]. He reported that patient recently had a SNF stay and is at baseline. At discharge, they would like for patient to return home with his wife. They will arrange home health aides and DME.   Expected Discharge Plan: [place] Barriers to Discharge: Continued Medical Work up   Patient Goals and CMS Choice Patient states their goals for this hospitalization and ongoing recovery are:: Return home CMS Medicare.gov Compare Post Acute Care list provided to:: Other (Comment Required) Choice offered to / list presented to : NA  Expected Discharge Plan and Services Expected Discharge Plan: [place] In-house Referral: Clinical Social Work   Post Acute Care Choice: Resumption of Svcs/PTA Provider ([facility]) Living arrangements for the past 2 months: Single Family Home                                      Prior Living Arrangements/Services Living arrangements for the past 2 months: Single Family Home Lives with:: Spouse Patient language and need for interpreter reviewed:: Yes Do you feel safe going back to the place where you live?: Yes      Need for Family Participation in Patient Care: Yes (Comment) Care giver support system in place?: Yes (comment) Current home services: DME, Homehealth aide Criminal Activity/Legal Involvement Pertinent to Current Situation/Hospitalization: No - Comment as needed  Activities of Daily Living      Permission Sought/Granted Permission sought to share information with : Facility Arts administrator granted to share info w AGENCY: [facility]        Emotional  Assessment   Attitude/Demeanor/Rapport: Unable to Assess Affect (typically observed): Unable to Assess Orientation: : Oriented to Self, Oriented to Place Alcohol / Substance Use: Not Applicable Psych Involvement: No (comment)  Admission diagnosis:  Ureteral stone with hydronephrosis [N13.2] Septic shock ([place]) [A41.9, R65.21] COVID-19 [U07.1] Patient Active Problem List   Diagnosis Date Noted  . Renal transplant recipient 03/12/2020  . Liver transplanted ([place]) 03/12/2020  . Sepsis secondary to UTI ([place]) 03/12/2020  . Bacteremia due to Gram-negative bacteria 03/12/2020  . Septic shock ([place]) 03/10/2020  . Bilateral carpal tunnel syndrome 01/27/2018  . Secondary esophageal varices without bleeding ([place])   . Discitis of cervical region   . Severe protein-calorie malnutrition ([place])   . Hypokalemia   . Pancytopenia ([place]) 09/14/2015  . Acidosis, metabolic 96/22/2979  . Pressure ulcer 09/09/2015  . Elevated lactic acid level   . Palliative care encounter 08/28/2015  . Hepatic encephalopathy ([place]) 08/27/2015  . Extradural and subdural abscess, 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/2015  . Myelitis ([place]) 08/13/2015  . Abscess in epidural space of cervical spine 08/13/2015  . Secondary biliary cirrhosis ([place])   . Protein-calorie malnutrition, severe 07/26/2015  . Ascites 07/25/2015  . Acute renal failure superimposed on stage 3 chronic kidney disease ([place]) 07/25/2015  . Anemia of chronic disease 07/25/2015  . Depression 07/25/2015  . Controlled type 2 diabetes mellitus with diabetic nephropathy, without long-term current use of insulin (  [place]) 07/25/2015  . Diabetic neuropathy ([place]) 07/25/2015  . Coagulopathy ([place]) 07/25/2015  . NASH (nonalcoholic steatohepatitis) 07/25/2015  . Body mass index (BMI) of 26.0-26.9 in adult 05/10/2015  . Other ascites 04/25/2015  . Acute hepatic encephalopathy 01/30/2015  . Thrombocytopenia ([place]) 10/22/2014  . Bleeding esophageal varices ([place]) 10/27/2012  . Hepatic  cirrhosis ([place]) 07/12/2012  . Obesity, unspecified 07/12/2012  . Portal hypertension ([place]) 07/12/2012  . Type 2 diabetes mellitus ([place]) 07/12/2012  . ESOPHAGEAL STRICTURE 09/23/2004   PCP:  Nicoletta Dress, MD Pharmacy:   [id number] - [place], [place] [address] [place] [address] Phone: [phone] Fax: [phone]  [facility] [place], [place] [address] [place] [address] Phone: [phone] Fax: [place], [place]. [place] [address] Phone: [phone] Fax: [phone]     Social Determinants of Health (SDOH) Interventions    Readmission Risk Interventions Readmission Risk Prevention Plan 03/13/2020  Transportation Screening Complete  PCP or Specialist Appt within 3-5 Days Complete  HRI or [place] Complete  Social Work Consult for [place] Planning/Counseling Complete  Palliative Care Screening Not Applicable  Medication Review Press photographer) Referral to Pharmacy  Some recent data might be hidden

## 2020-03-14 ENCOUNTER — Encounter (HOSPITAL_COMMUNITY): Payer: Self-pay | Admitting: Urology

## 2020-03-14 LAB — BASIC METABOLIC PANEL
Anion gap: 7 (ref 5–15)
BUN: 27 mg/dL — ABNORMAL HIGH (ref 8–23)
CO2: 21 mmol/L — ABNORMAL LOW (ref 22–32)
Calcium: 8.3 mg/dL — ABNORMAL LOW (ref 8.9–10.3)
Chloride: 112 mmol/L — ABNORMAL HIGH (ref 98–111)
Creatinine, Ser: 1.47 mg/dL — ABNORMAL HIGH (ref 0.61–1.24)
GFR calc Af Amer: 56 mL/min — ABNORMAL LOW (ref >60)
GFR calc non Af Amer: 49 mL/min — ABNORMAL LOW (ref >60)
Glucose, Bld: 74 mg/dL (ref 70–99)
Potassium: 3.5 mmol/L (ref 3.5–5.1)
Sodium: 140 mmol/L (ref 135–145)

## 2020-03-14 LAB — CBC
HCT: 35.4 % — ABNORMAL LOW (ref 39.0–52.0)
Hemoglobin: 11.1 g/dL — ABNORMAL LOW (ref 13.0–17.0)
MCH: 23.4 pg — ABNORMAL LOW (ref 26.0–34.0)
MCHC: 31.4 g/dL (ref 30.0–36.0)
MCV: 74.7 fL — ABNORMAL LOW (ref 80.0–100.0)
Platelets: 85 10*3/uL — ABNORMAL LOW (ref 150–400)
RBC: 4.74 MIL/uL (ref 4.22–5.81)
RDW: 16.6 % — ABNORMAL HIGH (ref 11.5–15.5)
WBC: 4.8 10*3/uL (ref 4.0–10.5)
nRBC: 0 % (ref 0.0–0.2)

## 2020-03-14 LAB — GLUCOSE, CAPILLARY
Glucose-Capillary: 58 mg/dL — ABNORMAL LOW (ref 70–99)
Glucose-Capillary: 72 mg/dL (ref 70–99)
Glucose-Capillary: 101 mg/dL — ABNORMAL HIGH (ref 70–99)
Glucose-Capillary: 121 mg/dL — ABNORMAL HIGH (ref 70–99)
Glucose-Capillary: 130 mg/dL — ABNORMAL HIGH (ref 70–99)
Glucose-Capillary: 148 mg/dL — ABNORMAL HIGH (ref 70–99)
Glucose-Capillary: 144 mg/dL — ABNORMAL HIGH (ref 70–99)

## 2020-03-14 LAB — MAGNESIUM: Magnesium: 1.4 mg/dL — ABNORMAL LOW (ref 1.7–2.4)

## 2020-03-14 LAB — IRON AND TIBC
Iron: 28 ug/dL — ABNORMAL LOW (ref 45–182)
Saturation Ratios: 14 % — ABNORMAL LOW (ref 17.9–39.5)
TIBC: 203 ug/dL — ABNORMAL LOW (ref 250–450)
UIBC: 175 ug/dL

## 2020-03-14 LAB — VITAMIN B12: Vitamin B-12: 400 pg/mL (ref 180–914)

## 2020-03-14 LAB — FERRITIN: Ferritin: 122 ng/mL (ref 24–336)

## 2020-03-14 MED ORDER — SENNOSIDES-DOCUSATE SODIUM 8.6-50 MG PO TABS: 2.0000 | ORAL_TABLET | Freq: Every evening | ORAL | Status: DC | PRN

## 2020-03-14 MED ORDER — MAGNESIUM SULFATE 4 GM/100ML IV SOLN
4.0000 g | Freq: Once | INTRAVENOUS | Status: AC
  Administered 2020-03-14: 4 g via INTRAVENOUS
  Filled 2020-03-14: qty 100

## 2020-03-14 MED ORDER — INSULIN ASPART 100 UNIT/ML ~~LOC~~ SOLN
0.0000 [IU] | Freq: Three times a day (TID) | SUBCUTANEOUS | Status: DC
  Administered 2020-03-14 – 2020-03-15 (×2): 1 [IU] via SUBCUTANEOUS
  Administered 2020-03-15 – 2020-03-17 (×4): 2 [IU] via SUBCUTANEOUS

## 2020-03-14 NOTE — Progress Notes (Signed)
PROGRESS NOTE    [name]  WUX:[id number] DOB: 11/08/1950 DOA: 03/10/2020 PCP: Nicoletta Dress, MD   Brief Narrative:  69 year old with history of DM2, status post liver and renal transplant, history of [name] cirrhosis, peripheral neuropathy, morbid obesity presented to the hospital with fevers chills found to be in septic shock secondary to urinary tract infection.  Had left-sided renal stone with hydronephrosis, underwent ureteral stent placement by urology on 9/5.  He was tested positive for COVID-19 on 9/4.  Urine cultures and blood cultures were positive for gram-negative rods, empirically started on cefepime.  Subjective: He denies any dyspnea, reports episode of hypoglycemia this morning.  Assessment & Plan:   Active Problems:   Acute renal failure superimposed on stage 3 chronic kidney disease (HCC)   Controlled type 2 diabetes mellitus with diabetic nephropathy, without long-term current use of insulin (HCC)   Diabetic neuropathy (HCC)   Septic shock (HCC)   Renal transplant recipient   Liver transplanted ([place])   Sepsis secondary to UTI ([place])   Bacteremia due to Gram-negative bacteria   E. coli UTI (urinary tract infection)   Ureteral stone with hydronephrosis   COVID-19   Septic shock secondary to urinary tract infection, gram-negative bacteremia Obstructive nephropathy/renal stone with hydronephrosis, left-sided status post ureteral stent placement 9/5 -Shock physiology has improved.  Transitioned off Levophed. -Currently on stress dose steroids, will discontinue this and transition to home p.o. prednisone -Blood cultures-E. coli -Urine cultures-E. coli -Antibiotics-On Rocephin 2g IV q24hrs. currently narrowed to cefazolin, hopefully can transition to p.o. in 48 hours -Urology following.  Maintain Foley catheter per urology until he is more ambulatory,  discussed with staff, will try to ambulate more today hopefully we can do a voiding trial tomorrow.  COVID-19  infection -Patient is not hypoxic.  No infiltrates noted on chest x-ray. He is vaccinated COVID 19 ~3 months ago.  -For now we will provide supportive care, incentive spirometer, flutter valve, as needed inhalers, Mucinex.    History of renal and liver transplant with history of [name] cirrhosis -Currently immunosuppressed on CellCept, tacrolimus and steroids  Diabetes mellitus type 2 Peripheral neuropathy secondary to DM2 -With an episode of hypoglycemia overnight, currently off Lantus.  Will change his sliding scale to sensitive. -A1c 6.0   DVT prophylaxis: Lovenox Code Status: Full code  Status is: Inpatient  Remains inpatient appropriate because:Inpatient level of care appropriate due to severity of illness   Dispo: The patient is from: Home              Anticipated d/c is to: Home              Anticipated d/c date is: 2 days              Patient currently is not medically stable to d/c.   Body mass index is 32.08 kg/m.  Pressure Injury 03/10/20 Sacrum Medial Stage II -  Partial thickness loss of dermis presenting as a shallow open ulcer with a red, pink wound bed without slough. small area of non-intact skin in middle of sacrum surrounded by pink blanchable skin (Active)  03/10/20 1920  Location: Sacrum  Location Orientation: Medial  Staging: Stage II -  Partial thickness loss of dermis presenting as a shallow open ulcer with a red, pink wound bed without slough.  Wound Description (Comments): small area of non-intact skin in middle of sacrum surrounded by pink blanchable skin  Present on Admission: Yes     Pressure Ulcer 09/14/15 Stage  I -  Intact skin with non-blanchable redness of a localized area usually over a bony prominence. (Active)  09/14/15 2030  Location: Ischial tuberosity  Location Orientation: Left;Right  Staging: Stage I -  Intact skin with non-blanchable redness of a localized area usually over a bony prominence.  Wound Description (Comments):   Present  on Admission: Yes       Awake Alert, Oriented X 3, No new F.N deficits, Normal affect Symmetrical Chest wall movement, Good air movement bilaterally, CTAB RRR,No Gallops,Rubs or new Murmurs, No Parasternal Heave +ve B.Sounds, Abd Soft, No tenderness, No rebound - guarding or rigidity. No Cyanosis, Clubbing or edema, No new Rash or bruise    Foley catheter in place  Objective: Vitals:   03/13/20 1955 03/14/20 0358 03/14/20 0731 03/14/20 1404  BP: 133/72 (!) 141/72    Pulse: 67 60  76  Resp: 16 18  16   Temp: 99.3 F (37.4 C) 98 F (36.7 C)  99.2 F (37.3 C)  TempSrc: Oral Oral  Axillary  SpO2: 97% 96% 95% 96%  Weight:      Height:        Intake/Output Summary (Last 24 hours) at 03/14/2020 1451 Last data filed at 03/14/2020 1050 Gross per 24 hour  Intake 3080.74 ml  Output 3150 ml  Net -69.26 ml   Filed Weights   03/10/20 0730 03/10/20 1800  Weight: 102.5 kg 101.4 kg     Data Reviewed:   CBC: Recent Labs  Lab 03/10/20 0815 03/10/20 0815 03/10/20 1058 03/10/20 1423 03/12/20 1154 03/13/20 0520 03/14/20 0500  WBC 3.9*  --   --  25.0* 10.2 7.0 4.8  NEUTROABS 3.7  --   --   --   --   --   --   HGB 13.9   < > 12.2* 12.4* 10.7* 10.0* 11.1*  HCT 45.1   < > 36.0* 40.7 34.1* 30.7* 35.4*  MCV 74.9*  --   --  79.3* 74.5* 74.7* 74.7*  PLT 95*  --   --  105* 71* 68* 85*   < > = values in this interval not displayed.   Basic Metabolic Panel: Recent Labs  Lab 03/10/20 0815 03/10/20 1058 03/10/20 1423 03/12/20 1154 03/13/20 0520 03/14/20 0500  NA 139 145  --  143 142 140  K 4.0 3.2*  --  3.4* 2.8* 3.5  CL 108  --   --  114* 112* 112*  CO2 18*  --   --  18* 20* 21*  GLUCOSE 109*  --   --  97 84 74  BUN 32*  --   --  43* 38* 27*  CREATININE 2.50*  --  2.74* 1.94* 1.68* 1.47*  CALCIUM 8.8*  --   --  8.0* 7.9* 8.3*  MG  --   --   --  1.3* 1.2* 1.4*   GFR: Estimated Creatinine Clearance: 58.2 mL/min (A) (by C-G formula based on SCr of 1.47 mg/dL (H)). Liver  Function Tests: Recent Labs  Lab 03/10/20 0815  AST 31  ALT 19  ALKPHOS 82  BILITOT 1.0  PROT 6.3*  ALBUMIN 3.1*   Recent Labs  Lab 03/10/20 0801  LIPASE 34   Recent Labs  Lab 03/10/20 0801  AMMONIA 48*   Coagulation Profile: Recent Labs  Lab 03/10/20 0815 03/11/20 1242  INR 1.2 1.4*   Cardiac Enzymes: No results for input(s): CKTOTAL, CKMB, CKMBINDEX, TROPONINI in the last 168 hours. BNP (last 3 results) No results for input(s):  PROBNP in the last 8760 hours. HbA1C: No results for input(s): HGBA1C in the last 72 hours. CBG: Recent Labs  Lab 03/13/20 2345 03/14/20 0359 03/14/20 0445 03/14/20 0735 03/14/20 1214  GLUCAP 96 58* 72 101* 121*   Lipid Profile: No results for input(s): CHOL, HDL, LDLCALC, TRIG, CHOLHDL, LDLDIRECT in the last 72 hours. Thyroid Function Tests: No results for input(s): TSH, T4TOTAL, FREET4, T3FREE, THYROIDAB in the last 72 hours. Anemia Panel: Recent Labs    03/14/20 0500  VITAMINB12 400  FERRITIN 122  TIBC 203*  IRON 28*   Sepsis Labs: Recent Labs  Lab 03/10/20 0801 03/10/20 1047 03/11/20 1242  PROCALCITON  --   --  45.15  LATICACIDVEN 3.2* 3.7*  --     Recent Results (from the past 240 hour(s))  SARS Coronavirus 2 by RT PCR (hospital order, performed in [facility] hospital lab) Nasopharyngeal Nasopharyngeal Swab     Status: Abnormal   Collection Time: 03/10/20  7:40 AM   Specimen: Nasopharyngeal Swab  Result Value Ref Range Status   SARS Coronavirus 2 POSITIVE (A) NEGATIVE Final    Comment: RESULT CALLED TO, READ BACK BY AND VERIFIED WITH: A,MCKOWN RN @1149  03/10/20 EB (NOTE) SARS-CoV-2 target nucleic acids are DETECTED  SARS-CoV-2 RNA is generally detectable in upper respiratory specimens  during the acute phase of infection.  Positive results are indicative  of the presence of the identified virus, but do not rule out bacterial infection or co-infection with other pathogens not detected by the test.  Clinical  correlation with patient history and  other diagnostic information is necessary to determine patient infection status.  The expected result is negative.  Fact Sheet for Patients:   StrictlyIdeas.no   Fact Sheet for Healthcare Providers:   BankingDealers.co.za    This test is not yet approved or cleared by the [place] FDA and  has been authorized for detection and/or diagnosis of SARS-CoV-2 by FDA under an Emergency Use Authorization (EUA).  This EUA will remain in effect (meaning this test can  be used) for the duration of  the COVID-19 declaration under Section 564(b)(1) of the Act, 21 U.S.C. section 360-bbb-3(b)(1), unless the authorization is terminated or revoked sooner.  Performed at [facility], [place] [address] [place], [place] [address]   Blood Culture (routine x 2)     Status: Abnormal   Collection Time: 03/10/20  8:01 AM   Specimen: BLOOD  Result Value Ref Range Status   Specimen Description BLOOD SITE NOT SPECIFIED  Final   Special Requests   Final    BOTTLES DRAWN AEROBIC AND ANAEROBIC Blood Culture adequate volume   Culture  Setup Time   Final    GRAM NEGATIVE COCCI AEROBIC BOTTLE ONLY CRITICAL VALUE NOTED.  VALUE IS CONSISTENT WITH PREVIOUSLY REPORTED AND CALLED VALUE. Performed at [facility], [place] [address], [place], [place] [address]    Culture ESCHERICHIA COLI (A)  Final   Report Status 03/12/2020 FINAL  Final   Organism ID, Bacteria ESCHERICHIA COLI  Final      Susceptibility   Escherichia coli - MIC*    AMPICILLIN 4 SENSITIVE Sensitive     CEFAZOLIN <=4 SENSITIVE Sensitive     CEFEPIME <=0.12 SENSITIVE Sensitive     CEFTAZIDIME <=1 SENSITIVE Sensitive     CEFTRIAXONE <=0.25 SENSITIVE Sensitive     CIPROFLOXACIN <=0.25 SENSITIVE Sensitive     GENTAMICIN <=1 SENSITIVE Sensitive     IMIPENEM <=0.25 SENSITIVE Sensitive  TRIMETH/SULFA <=20 SENSITIVE Sensitive     AMPICILLIN/SULBACTAM <=2  SENSITIVE Sensitive     PIP/TAZO <=4 SENSITIVE Sensitive     * ESCHERICHIA COLI  Urine culture     Status: Abnormal   Collection Time: 03/10/20 11:25 AM   Specimen: In/Out Cath Urine  Result Value Ref Range Status   Specimen Description IN/OUT CATH URINE  Final   Special Requests   Final    NONE Performed at [facility], [address], [place], [place] [address]    Culture >=100,000 COLONIES/mL ESCHERICHIA COLI (A)  Final   Report Status 03/12/2020 FINAL  Final   Organism ID, Bacteria ESCHERICHIA COLI (A)  Final      Susceptibility   Escherichia coli - MIC*    AMPICILLIN 4 SENSITIVE Sensitive     CEFAZOLIN <=4 SENSITIVE Sensitive     CEFTRIAXONE <=0.25 SENSITIVE Sensitive     CIPROFLOXACIN <=0.25 SENSITIVE Sensitive     GENTAMICIN <=1 SENSITIVE Sensitive     IMIPENEM <=0.25 SENSITIVE Sensitive     NITROFURANTOIN <=16 SENSITIVE Sensitive     TRIMETH/SULFA <=20 SENSITIVE Sensitive     AMPICILLIN/SULBACTAM 4 SENSITIVE Sensitive     PIP/TAZO <=4 SENSITIVE Sensitive     * >=100,000 COLONIES/mL ESCHERICHIA COLI  Blood Culture (routine x 2)     Status: Abnormal   Collection Time: 03/10/20  2:28 PM   Specimen: BLOOD RIGHT HAND  Result Value Ref Range Status   Specimen Description BLOOD RIGHT HAND  Final   Special Requests   Final    BOTTLES DRAWN AEROBIC AND ANAEROBIC Blood Culture results may not be optimal due to an inadequate volume of blood received in culture bottles   Culture  Setup Time   Final    GRAM NEGATIVE RODS ANAEROBIC BOTTLE ONLY CRITICAL RESULT CALLED TO, READ BACK BY AND VERIFIED WITH: PHARMD LAURA SEAY AT 2348 BY MESSAN H. ON 03/10/2020    Culture (A)  Final    ESCHERICHIA COLI SUSCEPTIBILITIES PERFORMED ON PREVIOUS CULTURE WITHIN THE LAST 5 DAYS. Performed at [facility], [place] [address] [place], [place] [address]    Report Status 03/12/2020 FINAL  Final  Blood Culture ID Panel (Reflexed)     Status: Abnormal   Collection Time: 03/10/20  2:28 PM    Result Value Ref Range Status   Enterococcus faecalis NOT DETECTED NOT DETECTED Final   Enterococcus Faecium NOT DETECTED NOT DETECTED Final   Listeria monocytogenes NOT DETECTED NOT DETECTED Final   Staphylococcus species NOT DETECTED NOT DETECTED Final   Staphylococcus aureus (BCID) NOT DETECTED NOT DETECTED Final   Staphylococcus epidermidis NOT DETECTED NOT DETECTED Final   Staphylococcus lugdunensis NOT DETECTED NOT DETECTED Final   Streptococcus species NOT DETECTED NOT DETECTED Final   Streptococcus agalactiae NOT DETECTED NOT DETECTED Final   Streptococcus pneumoniae NOT DETECTED NOT DETECTED Final   Streptococcus pyogenes NOT DETECTED NOT DETECTED Final   A.calcoaceticus-baumannii NOT DETECTED NOT DETECTED Final   Bacteroides fragilis NOT DETECTED NOT DETECTED Final   Enterobacterales DETECTED (A) NOT DETECTED Final    Comment: Enterobacterales represent a large order of gram negative bacteria, not a single organism. CRITICAL RESULT CALLED TO, READ BACK BY AND VERIFIED WITH: PHARMD LAURA SEAY AT 2348 BY MESSAN H. ON 03/10/2020    Enterobacter cloacae complex NOT DETECTED NOT DETECTED Final   Escherichia coli DETECTED (A) NOT DETECTED Final    Comment: CRITICAL RESULT CALLED TO, READ BACK BY AND VERIFIED WITH: PHARMD LAURA  SEAY AT 2348 BY MESSAN H. ON 03/10/2020    Klebsiella aerogenes NOT DETECTED NOT DETECTED Final   Klebsiella oxytoca NOT DETECTED NOT DETECTED Final   Klebsiella pneumoniae NOT DETECTED NOT DETECTED Final   Proteus species NOT DETECTED NOT DETECTED Final   Salmonella species NOT DETECTED NOT DETECTED Final   Serratia marcescens NOT DETECTED NOT DETECTED Final   Haemophilus influenzae NOT DETECTED NOT DETECTED Final   Neisseria meningitidis NOT DETECTED NOT DETECTED Final   Pseudomonas aeruginosa NOT DETECTED NOT DETECTED Final   Stenotrophomonas maltophilia NOT DETECTED NOT DETECTED Final   Candida albicans NOT DETECTED NOT DETECTED Final   Candida auris  NOT DETECTED NOT DETECTED Final   Candida glabrata NOT DETECTED NOT DETECTED Final   Candida krusei NOT DETECTED NOT DETECTED Final   Candida parapsilosis NOT DETECTED NOT DETECTED Final   Candida tropicalis NOT DETECTED NOT DETECTED Final   Cryptococcus neoformans/gattii NOT DETECTED NOT DETECTED Final   CTX-M ESBL NOT DETECTED NOT DETECTED Final   Carbapenem resistance IMP NOT DETECTED NOT DETECTED Final   Carbapenem resistance KPC NOT DETECTED NOT DETECTED Final   Carbapenem resistance NDM NOT DETECTED NOT DETECTED Final   Carbapenem resist OXA 48 LIKE NOT DETECTED NOT DETECTED Final   Carbapenem resistance VIM NOT DETECTED NOT DETECTED Final    Comment: Performed at [facility], [address] [place], [place] [address]  MRSA PCR Screening     Status: Abnormal   Collection Time: 03/10/20  6:08 PM   Specimen: Nasopharyngeal  Result Value Ref Range Status   MRSA by PCR POSITIVE (A) NEGATIVE Final    Comment:        The GeneXpert MRSA Assay (FDA approved for NASAL specimens only), is one component of a comprehensive MRSA colonization surveillance program. It is not intended to diagnose MRSA infection nor to guide or monitor treatment for MRSA infections. RESULT CALLED TO, READ BACK BY AND VERIFIED WITH: NATALE,G RN AT 2105 03/10/2020 MITCHELL,L Performed at [facility], [place] [address] [place], [place] [address]   Culture, blood (routine x 2)     Status: None (Preliminary result)   Collection Time: 03/10/20  6:52 PM   Specimen: BLOOD LEFT HAND  Result Value Ref Range Status   Specimen Description BLOOD LEFT HAND  Final   Special Requests   Final    BOTTLES DRAWN AEROBIC AND ANAEROBIC Blood Culture results may not be optimal due to an inadequate volume of blood received in culture bottles   Culture   Final    NO GROWTH 4 DAYS Performed at [facility], [place] [address] [place], [place] [address]    Report Status PENDING  Incomplete  Culture, blood  (routine x 2)     Status: None (Preliminary result)   Collection Time: 03/10/20  7:05 PM   Specimen: BLOOD  Result Value Ref Range Status   Specimen Description BLOOD RIGHT THUMB  Final   Special Requests   Final    BOTTLES DRAWN AEROBIC ONLY Blood Culture results may not be optimal due to an inadequate volume of blood received in culture bottles   Culture   Final    NO GROWTH 4 DAYS Performed at [facility], [place] [address], [place], [place] [address]    Report Status PENDING  Incomplete         Radiology Studies: No results found.      Scheduled Meds: . aspirin EC  81 mg Oral  Daily  . Chlorhexidine Gluconate Cloth  6 each Topical Q0600  . enoxaparin (LOVENOX) injection  40 mg Subcutaneous Q24H  . insulin aspart  0-15 Units Subcutaneous Q4H  . mupirocin ointment  1 application Nasal BID  . mycophenolate  500 mg Oral BID  . pantoprazole  40 mg Oral Daily  . predniSONE  5 mg Oral Daily  . pregabalin  100 mg Oral QHS  . pregabalin  50 mg Oral 2 times per day  . sertraline  50 mg Oral Daily  . Tacrolimus ER  4 mg Oral Daily  . traZODone  50 mg Oral QHS   Continuous Infusions: .  ceFAZolin (ANCEF) IV 2 g (03/14/20 0622)  . lactated ringers 100 mL/hr at 03/14/20 0127  . sodium chloride     And  . sodium chloride       LOS: 4 days     Phillips Climes, MD [facility]  If 7PM-7AM, please contact night-coverage  03/14/2020, 2:51 PM

## 2020-03-14 NOTE — Progress Notes (Signed)
Patient blood sugar dropped to 58 this morning.Patient is asymptomatic. Gave patient can sprite and applesauce.Rechecked blood sugar up to 72.Text paged Dr. Clearence Ped.

## 2020-03-15 ENCOUNTER — Other Ambulatory Visit: Payer: Self-pay

## 2020-03-15 LAB — BASIC METABOLIC PANEL
Anion gap: 12 (ref 5–15)
BUN: 15 mg/dL (ref 8–23)
CO2: 22 mmol/L (ref 22–32)
Calcium: 8.2 mg/dL — ABNORMAL LOW (ref 8.9–10.3)
Chloride: 106 mmol/L (ref 98–111)
Creatinine, Ser: 1.45 mg/dL — ABNORMAL HIGH (ref 0.61–1.24)
GFR calc Af Amer: 57 mL/min — ABNORMAL LOW (ref >60)
GFR calc non Af Amer: 49 mL/min — ABNORMAL LOW (ref >60)
Glucose, Bld: 105 mg/dL — ABNORMAL HIGH (ref 70–99)
Potassium: 3.2 mmol/L — ABNORMAL LOW (ref 3.5–5.1)
Sodium: 140 mmol/L (ref 135–145)

## 2020-03-15 LAB — CULTURE, BLOOD (ROUTINE X 2)
Culture: NO GROWTH
Culture: NO GROWTH

## 2020-03-15 LAB — CBC
HCT: 35.3 % — ABNORMAL LOW (ref 39.0–52.0)
Hemoglobin: 11.3 g/dL — ABNORMAL LOW (ref 13.0–17.0)
MCH: 23.5 pg — ABNORMAL LOW (ref 26.0–34.0)
MCHC: 32 g/dL (ref 30.0–36.0)
MCV: 73.5 fL — ABNORMAL LOW (ref 80.0–100.0)
Platelets: 74 10*3/uL — ABNORMAL LOW (ref 150–400)
RBC: 4.8 MIL/uL (ref 4.22–5.81)
RDW: 16.2 % — ABNORMAL HIGH (ref 11.5–15.5)
WBC: 3.4 10*3/uL — ABNORMAL LOW (ref 4.0–10.5)
nRBC: 0 % (ref 0.0–0.2)

## 2020-03-15 LAB — TACROLIMUS LEVEL: Tacrolimus (FK506) - LabCorp: 4.7 ng/mL (ref 2.0–20.0)

## 2020-03-15 LAB — FOLATE RBC
Folate, Hemolysate: 322 ng/mL
Folate, RBC: 985 ng/mL (ref >498)
Hematocrit: 32.7 % — ABNORMAL LOW (ref 37.5–51.0)

## 2020-03-15 LAB — GLUCOSE, CAPILLARY
Glucose-Capillary: 108 mg/dL — ABNORMAL HIGH (ref 70–99)
Glucose-Capillary: 106 mg/dL — ABNORMAL HIGH (ref 70–99)
Glucose-Capillary: 143 mg/dL — ABNORMAL HIGH (ref 70–99)
Glucose-Capillary: 173 mg/dL — ABNORMAL HIGH (ref 70–99)
Glucose-Capillary: 169 mg/dL — ABNORMAL HIGH (ref 70–99)

## 2020-03-15 LAB — MAGNESIUM: Magnesium: 1.6 mg/dL — ABNORMAL LOW (ref 1.7–2.4)

## 2020-03-15 MED ORDER — POTASSIUM CHLORIDE CRYS ER 20 MEQ PO TBCR
30.0000 meq | EXTENDED_RELEASE_TABLET | ORAL | Status: AC
  Administered 2020-03-15 (×2): 30 meq via ORAL
  Filled 2020-03-15 (×2): qty 1

## 2020-03-15 MED ORDER — MAGNESIUM SULFATE 4 GM/100ML IV SOLN
4.0000 g | Freq: Once | INTRAVENOUS | Status: AC
  Administered 2020-03-15: 4 g via INTRAVENOUS
  Filled 2020-03-15: qty 100

## 2020-03-15 MED ORDER — HYDRALAZINE HCL 20 MG/ML IJ SOLN
10.0000 mg | Freq: Once | INTRAMUSCULAR | Status: AC
  Administered 2020-03-16: 10 mg via INTRAVENOUS
  Filled 2020-03-15: qty 1

## 2020-03-15 MED ORDER — CHLORHEXIDINE GLUCONATE CLOTH 2 % EX PADS
6.0000 | MEDICATED_PAD | Freq: Every day | CUTANEOUS | Status: DC
  Administered 2020-03-15 – 2020-03-17 (×3): 6 via TOPICAL

## 2020-03-15 NOTE — Progress Notes (Signed)
Sitting up in a recliner- tolerates well.  Oxygen saturation 100% RA

## 2020-03-15 NOTE — TOC Progression Note (Addendum)
Transition of Care [facility]) - Progression Note    Patient Details  Name: [name] MRN: [id number] Date of Birth: Jul 13, 1950  Transition of [facility]) CM/SW [place], LCSW Phone Number: 03/15/2020, 2:13 PM  Clinical Narrative:    CSW made [facility] Shanon Brow) aware that MD may be able to discharge patient tomorrow so [facility] can take him directly to their building for an assessment. They request DC summary be faxed to [phone] when available tomorrow. They do not need any orders. They request patient to be ready as soon after lunch as possible and will bring his wheelchair to the hospital for him to leave in. They will contact CSW in the morning to arrange [facility] transport in the early afternoon and make his wife aware of the plan.    Expected Discharge Plan: [place] Barriers to Discharge: Continued Medical Work up  Expected Discharge Plan and Services Expected Discharge Plan: [place] In-house Referral:  ([place] patient, all DME and HH via [facility])   Post Acute Care Choice: Resumption of Svcs/PTA Provider ([facility]) Living arrangements for the past 2 months: Single Family Home                           HH Arranged:  ([facility])           Social Determinants of Health (SDOH) Interventions    Readmission Risk Interventions Readmission Risk Prevention Plan 03/13/2020  Transportation Screening Complete  PCP or Specialist Appt within 3-5 Days Complete  HRI or [place] Complete  Social Work Consult for [place] Planning/Counseling Complete  Palliative Care Screening Not Applicable  Medication Review Press photographer) Referral to Pharmacy  Some recent data might be hidden

## 2020-03-15 NOTE — Progress Notes (Signed)
Occupational Therapy Treatment Patient Details Name: [name] MRN: [id number] DOB: [birth year] Today's Date: 03/15/2020    History of present illness 69 year old with history of DM2, status post liver and renal transplant, history of [name] cirrhosis, peripheral neuropathy, morbid obesity presented to the hospital with fevers chills found to be in septic shock secondary to urinary tract infection.  Had left-sided renal stone with hydronephrosis, underwent ureteral stent placement by urology on 9/5.  He was tested positive for COVID-19 on 9/4.    OT comments  Pt continues to present with decreased cognition, balance, strength, and activity tolerance. Upon arrival, pt lethargic and falling asleep during conversation. Pt with difficulty following simple one step commands. Pt demonstrating poor sitting balance and requiring Max A to gain balance and fatigues quickly. Performing sit<>stand with Max A and RW. Continue to recommend dc to SNF and will continue to follow acutely as admitted.    Follow Up Recommendations  SNF;Supervision/Assistance - 24 hour (HHOT if 24/7 physical assist available)    Equipment Recommendations  None recommended by OT    Recommendations for Other Services      Precautions / Restrictions Precautions Precautions: Fall Precaution Comments: reports he hasnt walked in a few months       Mobility Bed Mobility Overal bed mobility: Needs Assistance Bed Mobility: Supine to Sit;Sit to Sidelying     Supine to sit: Max assist;HOB elevated   Sit to sidelying: Mod assist General bed mobility comments: Assist to elevate trunk into sitting and bring hips to EOB. Assist to bring legs back into the bed.   Transfers Overall transfer level: Needs assistance Equipment used: Rolling walker (2 wheeled) Transfers: Sit to/from Stand Sit to Stand: Max assist         General transfer comment: Max A to power up into standing with bed elevated.     Balance Overall balance  assessment: Needs assistance Sitting-balance support: Bilateral upper extremity supported;Feet supported Sitting balance-Leahy Scale: Poor Sitting balance - Comments: Pt requiring Max A to gain siting balance at EOB. Pt requiring cues to use bedrail (at [name]). Pt fatigues quickly and falls back onto bed (posterior and right). No control. Requiring Max A.  Postural control: Posterior lean;Right lateral lean                                 ADL either performed or assessed with clinical judgement   ADL Overall ADL's : Needs assistance/impaired                         Toilet Transfer: Maximal assistance Toilet Transfer Details (indicate cue type and reason): Sit<>stand with Max A to power up         Functional mobility during ADLs: Maximal assistance;Rolling walker General ADL Comments: Pt presenting with poor strength, balance, cognition, and activity tolerance.     Vision   Vision Assessment?: No apparent visual deficits   Perception     Praxis      Cognition Arousal/Alertness: Awake/alert Behavior During Therapy: Flat affect Overall Cognitive Status: No family/caregiver present to determine baseline cognitive functioning                                 General Comments: Pt with poor attention, problem solving, and awareness. Pt able to state he is in the hospital at  [facility]        Exercises     Shoulder Instructions       General Comments SpO2 100-98% on RA.    Pertinent Vitals/ Pain       Pain Assessment: No/denies pain  Home Living                                          Prior Functioning/Environment              Frequency  Min 2X/week        Progress Toward Goals  OT Goals(current goals can now be found in the care plan section)  Progress towards OT goals: Progressing toward goals  Acute Rehab OT Goals Patient Stated Goal: be able to walk again OT Goal Formulation: With  patient Time For Goal Achievement: 03/27/20 Potential to Achieve Goals: Fair ADL Goals Pt Will Perform Upper Body Bathing: with set-up;sitting Pt Will Perform Lower Body Bathing: with min assist;sitting/lateral leans Pt Will Transfer to Toilet: with mod assist;bedside commode Pt Will Perform Toileting - Clothing Manipulation and hygiene: with mod assist;sitting/lateral leans Additional ADL Goal #1: Pt will demo ability to maintain sitting balance EOB 5 minutes with no more than Min guard for ADLs  Plan Discharge plan remains appropriate    Co-evaluation                 AM-PAC OT "6 Clicks" Daily Activity     Outcome Measure   Help from another person eating meals?: A Little Help from another person taking care of personal grooming?: A Little Help from another person toileting, which includes using toliet, bedpan, or urinal?: Total Help from another person bathing (including washing, rinsing, drying)?: Total Help from another person to put on and taking off regular upper body clothing?: A Lot Help from another person to put on and taking off regular lower body clothing?: Total 6 Click Score: 11    End of Session Equipment Utilized During Treatment: Rolling walker  OT Visit Diagnosis: Unsteadiness on feet (R26.81);Other abnormalities of gait and mobility (R26.89);Muscle weakness (generalized) (M62.81)   Activity Tolerance Patient limited by fatigue   Patient Left in bed;with call bell/phone within reach;with bed alarm set   Nurse Communication Mobility status        Time: 3532-9924 OT Time Calculation (min): 19 min  Charges: OT General Charges $OT Visit: 1 Visit OT Treatments $Therapeutic Activity: 8-22 mins  [place], OTR/L Acute Rehab Pager: [phone] Office: [place] 03/15/2020, 2:12 PM

## 2020-03-15 NOTE — Plan of Care (Signed)

## 2020-03-15 NOTE — Evaluation (Signed)
Physical Therapy Evaluation Patient Details Name: [name] MRN: [id number] DOB: 03-12-1951 Today's Date: 03/15/2020   History of Present Illness  69 year old with history of DM2, status post liver and renal transplant, history of [name] cirrhosis, peripheral neuropathy, morbid obesity presented to the hospital with fevers chills found to be in septic shock secondary to urinary tract infection.  Had left-sided renal stone with hydronephrosis, underwent ureteral stent placement by urology on 9/5.  He was tested positive for COVID-19 on 9/4.   Clinical Impression  Pt presents to PT with deficits in functional mobility, balance, strength, power, and endurance. Pt reports he has not ambulated within the last month or transferred without use of the hoyer lift, however he also reports feeling much stronger than he is currently. Pt requires max-totalA for all functional mobility at this time and demonstrates the potential, with improved strength, to better assist caregivers with bed mobility and sitting balance activities. Pt will benefit from acute therapies during this admission to improve bed mobility and strength. Pt declines PT recommendation for SNF placement and would like to return home with assistance from spouse and family. PT thus recommends continuing home health PT and PACE services. Pt has all necessary DME at this time.    Follow Up Recommendations SNF (pt declining SNF, wants to return home with HHPT/PACE)    Equipment Recommendations  None recommended by PT (pt owns necessary DME)    Recommendations for Other Services       Precautions / Restrictions Precautions Precautions: Fall Restrictions Weight Bearing Restrictions: No      Mobility  Bed Mobility Overal bed mobility: Needs Assistance Bed Mobility: Rolling Rolling: Max assist         General bed mobility comments: pt rolls left and right to assist with placing lift sling  Transfers Overall transfer level: Needs  assistance Equipment used: 1 person hand held assist Transfers: Sit to/from Stand Sit to Stand: Max assist         General transfer comment: dependent transfer from bed to recliner with use of maximove. PT provides BUE support and R knee block for sit to stand  Ambulation/Gait                Stairs            Wheelchair Mobility    Modified Rankin (Stroke Patients Only)       Balance Overall balance assessment: Needs assistance Sitting-balance support: Bilateral upper extremity supported;Feet supported Sitting balance-Leahy Scale: Poor Sitting balance - Comments: pt reliant on BUE to pull forward in recliner and maintain sitting balance Postural control: Posterior lean Standing balance support: Bilateral upper extremity supported                                 Pertinent Vitals/Pain Pain Assessment: No/denies pain    Home Living Family/patient expects to be discharged to:: Private residence Living Arrangements: Spouse/significant other Available Help at Discharge: Family Type of Home: House Home Access: Level entry (at basement)     Home Layout: Multi-level Home Equipment: Shower seat - built in;Wheelchair - manual;Bedside commode;Walker - 2 wheels;Hospital bed;Other (comment) (hoyer lift) Additional Comments: Pt reports being unable to walk for 3 months after neck surgery. Has been staying in basement with level entry. Full kitchen, bathroom, bedroom in basement    Prior Function Level of Independence: Needs assistance   Gait / Transfers Assistance Needed: Pt reports using hoyer lift  since neck surgery several months ago. Reports going to rehab, recently returning home, [place] working on safe transfers with lift and exercise program  ADL's / Homemaking Assistance Needed: Wife assists with IADLs. Pt reports able to bathe/dress self usually.  Unsure of accuracy   Comments: Pt receiving PT at [facility] vs HH.     Hand Dominance   Dominant Hand:  Right    Extremity/Trunk Assessment   Upper Extremity Assessment Upper Extremity Assessment: Generalized weakness    Lower Extremity Assessment Lower Extremity Assessment: Generalized weakness (grossly 3-/5 BLE)    Cervical / Trunk Assessment Cervical / Trunk Assessment: Other exceptions Cervical / Trunk Exceptions: obesity, forward head posture  Communication   Communication: No difficulties  Cognition Arousal/Alertness: Awake/alert Behavior During Therapy: Flat affect Overall Cognitive Status: No family/caregiver present to determine baseline cognitive functioning                                 General Comments: pt with slowed processing, short responses to PT questions, maintains attention well once upright, initially pt is more lethargic      General Comments General comments (skin integrity, edema, etc.): VSS on RA, sats 96-100%    Exercises     Assessment/Plan    PT Assessment Patient needs continued PT services  PT Problem List Decreased strength;Decreased activity tolerance;Decreased balance;Decreased mobility;Decreased cognition;Decreased knowledge of use of DME       PT Treatment Interventions DME instruction;Functional mobility training;Neuromuscular re-education;Patient/family education;Wheelchair mobility training;Therapeutic activities;Therapeutic exercise;Balance training    PT Goals (Current goals can be found in the Care Plan section)  Acute Rehab PT Goals Patient Stated Goal: To go home PT Goal Formulation: With patient Time For Goal Achievement: 03/29/20 Potential to Achieve Goals: Fair    Frequency Min 2X/week   Barriers to discharge        Co-evaluation               AM-PAC PT "6 Clicks" Mobility  Outcome Measure Help needed turning from your back to your side while in a flat bed without using bedrails?: Total Help needed moving from lying on your back to sitting on the side of a flat bed without using bedrails?:  Total Help needed moving to and from a bed to a chair (including a wheelchair)?: Total Help needed standing up from a chair using your arms (e.g., wheelchair or bedside chair)?: Total Help needed to walk in hospital room?: Total Help needed climbing 3-5 steps with a railing? : Total 6 Click Score: 6    End of Session   Activity Tolerance: Patient tolerated treatment well Patient left: in chair;with call bell/phone within reach Nurse Communication: Mobility status PT Visit Diagnosis: Other abnormalities of gait and mobility (R26.89);Muscle weakness (generalized) (M62.81);Other symptoms and signs involving the nervous system (R29.898)    Time: 4627-0350 PT Time Calculation (min) (ACUTE ONLY): 45 min   Charges:   PT Evaluation $PT Eval Moderate Complexity: 1 Mod PT Treatments $Therapeutic Activity: 8-22 mins        Zenaida Niece, PT, DPT Acute Rehabilitation Pager: [phone]   Zenaida Niece 03/15/2020, 5:59 PM

## 2020-03-15 NOTE — Progress Notes (Signed)
PROGRESS NOTE    [name]  VVO:[id number] DOB: 29-Sep-1950 DOA: 03/10/2020 PCP: Jonathan Dress, MD   Brief Narrative:  69 year old with history of DM2, status post liver and renal transplant, history of [name] cirrhosis, peripheral neuropathy, morbid obesity presented to the hospital with fevers chills found to be in septic shock secondary to urinary tract infection.  Had left-sided renal stone with hydronephrosis, underwent ureteral stent placement by urology on 9/5.  He was tested positive for COVID-19 on 9/4.  Urine cultures and blood cultures were positive for gram-negative rods, empirically started on cefepime.  Subjective: He denies any dyspnea, no cough, still with foley.  Assessment & Plan:   Active Problems:   Acute renal failure superimposed on stage 3 chronic kidney disease (HCC)   Controlled type 2 diabetes mellitus with diabetic nephropathy, without long-term current use of insulin (HCC)   Diabetic neuropathy (HCC)   Septic shock (HCC)   Renal transplant recipient   Liver transplanted ([place])   Sepsis secondary to UTI ([place])   Bacteremia due to Gram-negative bacteria   E. coli UTI (urinary tract infection)   Ureteral stone with hydronephrosis   COVID-19   Septic shock secondary to urinary tract infection, gram-negative bacteremia Obstructive nephropathy/renal stone with hydronephrosis, left-sided status post ureteral stent placement 9/5 -Shock physiology has improved.  Transitioned off Levophed. -Initially on stress dose steroids, currently back to home dose prednisone.  . -Blood cultures-E. coli -Urine cultures-E. coli -Antibiotics-On Rocephin 2g IV q24hrs. currently narrowed to cefazolin, hopefully can be discharged tomorrow on oral antibiotics. -I have discussed with urology Dr. Junious Burch, he will follow as an outpatient to remove the stent, but he does recommend he remains on prophylactic antibiotics until he is seen by him, so plan to discharge on Keflex  tomorrow for total of 10 days of antibiotic treatment, but after that he is to remian  remains on keflex 500 mg nightly for 2 weeks or until he is seen by him . -We will DC Foley catheter today, will monitor for retention, will monitor bladder scan every 8 hours.  COVID-19 infection -Patient is not hypoxic.  No infiltrates noted on chest x-ray. He is vaccinated COVID 19 ~3 months ago.  -For now we will provide supportive care, incentive spirometer, flutter valve, as needed inhalers, Mucinex.    History of renal and liver transplant with history of [name] cirrhosis -Currently immunosuppressed on CellCept, tacrolimus and steroids  Thrombocytopenia -Chronic, in the setting of known liver disease/[place], at baseline  Hypomagnesemia/hypokalemia -Repleted  Diabetes mellitus type 2 Peripheral neuropathy secondary to DM2 -With an episode of hypoglycemia overnight, currently off Lantus.  Will change his sliding scale to sensitive. -A1c 6.0  Severe deconditioning/wheelchair dependent -Patient with extensive neurosurgical history at [facility] "had undergone suboccipital and cervical decompression versus fenestration by Dr. Dava Burch postoperative course complicated by concern for hydrocephalus and posterior fossa stroke which he went additional posterior fossa decompression and EVD placement" -Patient reports since then he has been wheelchair dependent -Further management per [facility] neurosurgery. -I have discussed with patient and staff, he usually sits slumped in the bed, I have discussed with him he only to eat or take his medication when he is sitting upright with full aspiration precaution. -He is with pace program, they will continue to follow on discharge.    DVT prophylaxis: Lovenox Family communication: updated wife [name] Code Status: Full code  Status is: Inpatient  Remains inpatient appropriate because:Inpatient level of care appropriate due to severity of illness  Dispo: The patient  is from: Home              Anticipated d/c is to: Home              Anticipated d/c date is: 1 day              Patient currently is not medically stable to d/c.   Body mass index is 32.08 kg/m.  Pressure Injury 03/10/20 Sacrum Medial Stage II -  Partial thickness loss of dermis presenting as a shallow open ulcer with a red, pink wound bed without slough. small area of non-intact skin in middle of sacrum surrounded by pink blanchable skin (Active)  03/10/20 1920  Location: Sacrum  Location Orientation: Medial  Staging: Stage II -  Partial thickness loss of dermis presenting as a shallow open ulcer with a red, pink wound bed without slough.  Wound Description (Comments): small area of non-intact skin in middle of sacrum surrounded by pink blanchable skin  Present on Admission: Yes     Pressure Ulcer 09/14/15 Stage I -  Intact skin with non-blanchable redness of a localized area usually over a bony prominence. (Active)  09/14/15 2030  Location: Ischial tuberosity  Location Orientation: Left;Right  Staging: Stage I -  Intact skin with non-blanchable redness of a localized area usually over a bony prominence.  Wound Description (Comments):   Present on Admission: Yes       Awake Alert, Oriented X 3, frail, deconditioned, Normal affect Symmetrical Chest wall movement, Good air movement bilaterally, CTAB RRR,No Gallops,Rubs or new Murmurs, No Parasternal Heave +ve B.Sounds, Abd Soft, No tenderness, No rebound - guarding or rigidity. No Cyanosis, Clubbing or edema, No new Rash or bruise, patient with chronic lower extremity weakness. -Foley catheter in place with clear-colored urine   Objective: Vitals:   03/14/20 1957 03/15/20 0400 03/15/20 0742 03/15/20 1122  BP: (!) 154/69 (!) 147/89 (!) 171/83 (!) 174/74  Pulse: 65 71 60 60  Resp: 20 17 20 16   Temp: 98 F (36.7 C) 98.2 F (36.8 C) 98.2 F (36.8 C) 98.2 F (36.8 C)  TempSrc: Oral Oral Oral Oral  SpO2: 97% 100% 99% 98%    Weight:      Height:        Intake/Output Summary (Last 24 hours) at 03/15/2020 1154 Last data filed at 03/15/2020 0709 Gross per 24 hour  Intake 2540.74 ml  Output 5690 ml  Net -3149.26 ml   Filed Weights   03/10/20 0730 03/10/20 1800  Weight: 102.5 kg 101.4 kg     Data Reviewed:   CBC: Recent Labs  Lab 03/10/20 0815 03/10/20 1058 03/10/20 1423 03/12/20 1154 03/13/20 0520 03/14/20 0500 03/15/20 0750  WBC 3.9*   < > 25.0* 10.2 7.0 4.8 3.4*  NEUTROABS 3.7  --   --   --   --   --   --   HGB 13.9   < > 12.4* 10.7* 10.0* 11.1* 11.3*  HCT 45.1   < > 40.7 34.1* 30.7* 35.4* 35.3*  MCV 74.9*   < > 79.3* 74.5* 74.7* 74.7* 73.5*  PLT 95*   < > 105* 71* 68* 85* 74*   < > = values in this interval not displayed.   Basic Metabolic Panel: Recent Labs  Lab 03/10/20 0815 03/10/20 0815 03/10/20 1058 03/10/20 1423 03/12/20 1154 03/13/20 0520 03/14/20 0500 03/15/20 0750  NA 139   < > 145  --  143 142 140 140  K 4.0   < >  3.2*  --  3.4* 2.8* 3.5 3.2*  CL 108  --   --   --  114* 112* 112* 106  CO2 18*  --   --   --  18* 20* 21* 22  GLUCOSE 109*  --   --   --  97 84 74 105*  BUN 32*  --   --   --  43* 38* 27* 15  CREATININE 2.50*   < >  --  2.74* 1.94* 1.68* 1.47* 1.45*  CALCIUM 8.8*  --   --   --  8.0* 7.9* 8.3* 8.2*  MG  --   --   --   --  1.3* 1.2* 1.4* 1.6*   < > = values in this interval not displayed.   GFR: Estimated Creatinine Clearance: 59 mL/min (A) (by C-G formula based on SCr of 1.45 mg/dL (H)). Liver Function Tests: Recent Labs  Lab 03/10/20 0815  AST 31  ALT 19  ALKPHOS 82  BILITOT 1.0  PROT 6.3*  ALBUMIN 3.1*   Recent Labs  Lab 03/10/20 0801  LIPASE 34   Recent Labs  Lab 03/10/20 0801  AMMONIA 48*   Coagulation Profile: Recent Labs  Lab 03/10/20 0815 03/11/20 1242  INR 1.2 1.4*   Cardiac Enzymes: No results for input(s): CKTOTAL, CKMB, CKMBINDEX, TROPONINI in the last 168 hours. BNP (last 3 results) No results for input(s): PROBNP in  the last 8760 hours. HbA1C: No results for input(s): HGBA1C in the last 72 hours. CBG: Recent Labs  Lab 03/14/20 1620 03/14/20 1954 03/14/20 2117 03/15/20 0356 03/15/20 0745  GLUCAP 130* 148* 144* 108* 106*   Lipid Profile: No results for input(s): CHOL, HDL, LDLCALC, TRIG, CHOLHDL, LDLDIRECT in the last 72 hours. Thyroid Function Tests: No results for input(s): TSH, T4TOTAL, FREET4, T3FREE, THYROIDAB in the last 72 hours. Anemia Panel: Recent Labs    03/14/20 0500  VITAMINB12 400  FERRITIN 122  TIBC 203*  IRON 28*   Sepsis Labs: Recent Labs  Lab 03/10/20 0801 03/10/20 1047 03/11/20 1242  PROCALCITON  --   --  45.15  LATICACIDVEN 3.2* 3.7*  --     Recent Results (from the past 240 hour(s))  SARS Coronavirus 2 by RT PCR (hospital order, performed in [facility] hospital lab) Nasopharyngeal Nasopharyngeal Swab     Status: Abnormal   Collection Time: 03/10/20  7:40 AM   Specimen: Nasopharyngeal Swab  Result Value Ref Range Status   SARS Coronavirus 2 POSITIVE (A) NEGATIVE Final    Comment: RESULT CALLED TO, READ BACK BY AND VERIFIED WITH: A,MCKOWN RN @1149  03/10/20 EB (NOTE) SARS-CoV-2 target nucleic acids are DETECTED  SARS-CoV-2 RNA is generally detectable in upper respiratory specimens  during the acute phase of infection.  Positive results are indicative  of the presence of the identified virus, but do not rule out bacterial infection or co-infection with other pathogens not detected by the test.  Clinical correlation with patient history and  other diagnostic information is necessary to determine patient infection status.  The expected result is negative.  Fact Sheet for Patients:   StrictlyIdeas.no   Fact Sheet for Healthcare Providers:   BankingDealers.co.za    This test is not yet approved or cleared by the [place] FDA and  has been authorized for detection and/or diagnosis of SARS-CoV-2 by FDA under  an Emergency Use Authorization (EUA).  This EUA will remain in effect (meaning this test can  be used) for the duration of  the  COVID-19 declaration under Section 564(b)(1) of the Act, 21 U.S.C. section 360-bbb-3(b)(1), unless the authorization is terminated or revoked sooner.  Performed at [facility], [place] [address] [place], [place] [address]   Blood Culture (routine x 2)     Status: Abnormal   Collection Time: 03/10/20  8:01 AM   Specimen: BLOOD  Result Value Ref Range Status   Specimen Description BLOOD SITE NOT SPECIFIED  Final   Special Requests   Final    BOTTLES DRAWN AEROBIC AND ANAEROBIC Blood Culture adequate volume   Culture  Setup Time   Final    GRAM NEGATIVE COCCI AEROBIC BOTTLE ONLY CRITICAL VALUE NOTED.  VALUE IS CONSISTENT WITH PREVIOUSLY REPORTED AND CALLED VALUE. Performed at [facility], [place] [address], [place], [place] [address]    Culture ESCHERICHIA COLI (A)  Final   Report Status 03/12/2020 FINAL  Final   Organism ID, Bacteria ESCHERICHIA COLI  Final      Susceptibility   Escherichia coli - MIC*    AMPICILLIN 4 SENSITIVE Sensitive     CEFAZOLIN <=4 SENSITIVE Sensitive     CEFEPIME <=0.12 SENSITIVE Sensitive     CEFTAZIDIME <=1 SENSITIVE Sensitive     CEFTRIAXONE <=0.25 SENSITIVE Sensitive     CIPROFLOXACIN <=0.25 SENSITIVE Sensitive     GENTAMICIN <=1 SENSITIVE Sensitive     IMIPENEM <=0.25 SENSITIVE Sensitive     TRIMETH/SULFA <=20 SENSITIVE Sensitive     AMPICILLIN/SULBACTAM <=2 SENSITIVE Sensitive     PIP/TAZO <=4 SENSITIVE Sensitive     * ESCHERICHIA COLI  Urine culture     Status: Abnormal   Collection Time: 03/10/20 11:25 AM   Specimen: In/Out Cath Urine  Result Value Ref Range Status   Specimen Description IN/OUT CATH URINE  Final   Special Requests   Final    NONE Performed at [facility], [place] [address] [place], [place] [address]    Culture >=100,000 COLONIES/mL ESCHERICHIA COLI (A)  Final   Report Status  03/12/2020 FINAL  Final   Organism ID, Bacteria ESCHERICHIA COLI (A)  Final      Susceptibility   Escherichia coli - MIC*    AMPICILLIN 4 SENSITIVE Sensitive     CEFAZOLIN <=4 SENSITIVE Sensitive     CEFTRIAXONE <=0.25 SENSITIVE Sensitive     CIPROFLOXACIN <=0.25 SENSITIVE Sensitive     GENTAMICIN <=1 SENSITIVE Sensitive     IMIPENEM <=0.25 SENSITIVE Sensitive     NITROFURANTOIN <=16 SENSITIVE Sensitive     TRIMETH/SULFA <=20 SENSITIVE Sensitive     AMPICILLIN/SULBACTAM 4 SENSITIVE Sensitive     PIP/TAZO <=4 SENSITIVE Sensitive     * >=100,000 COLONIES/mL ESCHERICHIA COLI  Blood Culture (routine x 2)     Status: Abnormal   Collection Time: 03/10/20  2:28 PM   Specimen: BLOOD RIGHT HAND  Result Value Ref Range Status   Specimen Description BLOOD RIGHT HAND  Final   Special Requests   Final    BOTTLES DRAWN AEROBIC AND ANAEROBIC Blood Culture results may not be optimal due to an inadequate volume of blood received in culture bottles   Culture  Setup Time   Final    GRAM NEGATIVE RODS ANAEROBIC BOTTLE ONLY CRITICAL RESULT CALLED TO, READ BACK BY AND VERIFIED WITH: PHARMD LAURA SEAY AT 2348 BY MESSAN H. ON 03/10/2020    Culture (A)  Final    ESCHERICHIA COLI SUSCEPTIBILITIES PERFORMED ON PREVIOUS CULTURE WITHIN THE LAST 5 DAYS. Performed at [facility], [address], [place], [place] [address]    Report Status 03/12/2020 FINAL  Final  Blood Culture ID Panel (Reflexed)     Status: Abnormal   Collection Time: 03/10/20  2:28 PM  Result Value Ref Range Status   Enterococcus faecalis NOT DETECTED NOT DETECTED Final   Enterococcus Faecium NOT DETECTED NOT DETECTED Final   Listeria monocytogenes NOT DETECTED NOT DETECTED Final   Staphylococcus species NOT DETECTED NOT DETECTED Final   Staphylococcus aureus (BCID) NOT DETECTED NOT DETECTED Final   Staphylococcus epidermidis NOT DETECTED NOT DETECTED Final   Staphylococcus lugdunensis NOT DETECTED NOT DETECTED Final    Streptococcus species NOT DETECTED NOT DETECTED Final   Streptococcus agalactiae NOT DETECTED NOT DETECTED Final   Streptococcus pneumoniae NOT DETECTED NOT DETECTED Final   Streptococcus pyogenes NOT DETECTED NOT DETECTED Final   A.calcoaceticus-baumannii NOT DETECTED NOT DETECTED Final   Bacteroides fragilis NOT DETECTED NOT DETECTED Final   Enterobacterales DETECTED (A) NOT DETECTED Final    Comment: Enterobacterales represent a large order of gram negative bacteria, not a single organism. CRITICAL RESULT CALLED TO, READ BACK BY AND VERIFIED WITH: PHARMD LAURA SEAY AT 2348 BY MESSAN H. ON 03/10/2020    Enterobacter cloacae complex NOT DETECTED NOT DETECTED Final   Escherichia coli DETECTED (A) NOT DETECTED Final    Comment: CRITICAL RESULT CALLED TO, READ BACK BY AND VERIFIED WITH: PHARMD LAURA SEAY AT 2348 BY MESSAN H. ON 03/10/2020    Klebsiella aerogenes NOT DETECTED NOT DETECTED Final   Klebsiella oxytoca NOT DETECTED NOT DETECTED Final   Klebsiella pneumoniae NOT DETECTED NOT DETECTED Final   Proteus species NOT DETECTED NOT DETECTED Final   Salmonella species NOT DETECTED NOT DETECTED Final   Serratia marcescens NOT DETECTED NOT DETECTED Final   Haemophilus influenzae NOT DETECTED NOT DETECTED Final   Neisseria meningitidis NOT DETECTED NOT DETECTED Final   Pseudomonas aeruginosa NOT DETECTED NOT DETECTED Final   Stenotrophomonas maltophilia NOT DETECTED NOT DETECTED Final   Candida albicans NOT DETECTED NOT DETECTED Final   Candida auris NOT DETECTED NOT DETECTED Final   Candida glabrata NOT DETECTED NOT DETECTED Final   Candida krusei NOT DETECTED NOT DETECTED Final   Candida parapsilosis NOT DETECTED NOT DETECTED Final   Candida tropicalis NOT DETECTED NOT DETECTED Final   Cryptococcus neoformans/gattii NOT DETECTED NOT DETECTED Final   CTX-M ESBL NOT DETECTED NOT DETECTED Final   Carbapenem resistance IMP NOT DETECTED NOT DETECTED Final   Carbapenem resistance KPC NOT  DETECTED NOT DETECTED Final   Carbapenem resistance NDM NOT DETECTED NOT DETECTED Final   Carbapenem resist OXA 48 LIKE NOT DETECTED NOT DETECTED Final   Carbapenem resistance VIM NOT DETECTED NOT DETECTED Final    Comment: Performed at [facility], [address] [place], [place] [address]  MRSA PCR Screening     Status: Abnormal   Collection Time: 03/10/20  6:08 PM   Specimen: Nasopharyngeal  Result Value Ref Range Status   MRSA by PCR POSITIVE (A) NEGATIVE Final    Comment:        The GeneXpert MRSA Assay (FDA approved for NASAL specimens only), is one component of a comprehensive MRSA colonization surveillance program. It is not intended to diagnose MRSA infection nor to guide or monitor treatment for MRSA infections. RESULT CALLED TO, READ BACK BY AND VERIFIED WITH: NATALE,G RN AT 2105 03/10/2020 MITCHELL,L Performed at [facility], [place] [address], [place], [place] [address]   Culture, blood (  routine x 2)     Status: None   Collection Time: 03/10/20  6:52 PM   Specimen: BLOOD LEFT HAND  Result Value Ref Range Status   Specimen Description BLOOD LEFT HAND  Final   Special Requests   Final    BOTTLES DRAWN AEROBIC AND ANAEROBIC Blood Culture results may not be optimal due to an inadequate volume of blood received in culture bottles   Culture   Final    NO GROWTH 5 DAYS Performed at [facility], [place] [address], [place], [place] [address]    Report Status 03/15/2020 FINAL  Final  Culture, blood (routine x 2)     Status: None   Collection Time: 03/10/20  7:05 PM   Specimen: BLOOD  Result Value Ref Range Status   Specimen Description BLOOD RIGHT THUMB  Final   Special Requests   Final    BOTTLES DRAWN AEROBIC ONLY Blood Culture results may not be optimal due to an inadequate volume of blood received in culture bottles   Culture   Final    NO GROWTH 5 DAYS Performed at [facility], [place] [address] [place],  [address]    Report Status  03/15/2020 FINAL  Final         Radiology Studies: No results found.      Scheduled Meds: . aspirin EC  81 mg Oral Daily  . enoxaparin (LOVENOX) injection  40 mg Subcutaneous Q24H  . insulin aspart  0-9 Units Subcutaneous TID WC  . mycophenolate  500 mg Oral BID  . pantoprazole  40 mg Oral Daily  . predniSONE  5 mg Oral Daily  . pregabalin  100 mg Oral QHS  . pregabalin  50 mg Oral 2 times per day  . sertraline  50 mg Oral Daily  . Tacrolimus ER  4 mg Oral Daily  . traZODone  50 mg Oral QHS   Continuous Infusions: .  ceFAZolin (ANCEF) IV 2 g (03/15/20 8003)  . lactated ringers 100 mL/hr at 03/15/20 0102  . sodium chloride     And  . sodium chloride       LOS: 5 days     Phillips Climes, MD [facility]  If 7PM-7AM, please contact night-coverage  03/15/2020, 11:54 AM

## 2020-03-16 ENCOUNTER — Other Ambulatory Visit: Payer: Self-pay | Admitting: Urology

## 2020-03-16 ENCOUNTER — Inpatient Hospital Stay (HOSPITAL_COMMUNITY): Payer: No Typology Code available for payment source

## 2020-03-16 DIAGNOSIS — R0789 Other chest pain: Secondary | ICD-10-CM

## 2020-03-16 DIAGNOSIS — Z419 Encounter for procedure for purposes other than remedying health state, unspecified: Secondary | ICD-10-CM

## 2020-03-16 LAB — BASIC METABOLIC PANEL
Anion gap: 12 (ref 5–15)
BUN: 9 mg/dL (ref 8–23)
CO2: 27 mmol/L (ref 22–32)
Calcium: 8.4 mg/dL — ABNORMAL LOW (ref 8.9–10.3)
Chloride: 102 mmol/L (ref 98–111)
Creatinine, Ser: 1.2 mg/dL (ref 0.61–1.24)
GFR calc Af Amer: >60 mL/min (ref >60)
GFR calc non Af Amer: >60 mL/min (ref >60)
Glucose, Bld: 105 mg/dL — ABNORMAL HIGH (ref 70–99)
Potassium: 3.2 mmol/L — ABNORMAL LOW (ref 3.5–5.1)
Sodium: 141 mmol/L (ref 135–145)

## 2020-03-16 LAB — CBC
HCT: 38.6 % — ABNORMAL LOW (ref 39.0–52.0)
Hemoglobin: 12.3 g/dL — ABNORMAL LOW (ref 13.0–17.0)
MCH: 23.1 pg — ABNORMAL LOW (ref 26.0–34.0)
MCHC: 31.9 g/dL (ref 30.0–36.0)
MCV: 72.6 fL — ABNORMAL LOW (ref 80.0–100.0)
Platelets: 98 10*3/uL — ABNORMAL LOW (ref 150–400)
RBC: 5.32 MIL/uL (ref 4.22–5.81)
RDW: 16.1 % — ABNORMAL HIGH (ref 11.5–15.5)
WBC: 4.3 10*3/uL (ref 4.0–10.5)
nRBC: 0 % (ref 0.0–0.2)

## 2020-03-16 LAB — GLUCOSE, CAPILLARY
Glucose-Capillary: 113 mg/dL — ABNORMAL HIGH (ref 70–99)
Glucose-Capillary: 172 mg/dL — ABNORMAL HIGH (ref 70–99)
Glucose-Capillary: 191 mg/dL — ABNORMAL HIGH (ref 70–99)
Glucose-Capillary: 163 mg/dL — ABNORMAL HIGH (ref 70–99)

## 2020-03-16 LAB — TROPONIN I (HIGH SENSITIVITY): Troponin I (High Sensitivity): 13 ng/L (ref <18)

## 2020-03-16 LAB — PROCALCITONIN: Procalcitonin: 1.75 ng/mL

## 2020-03-16 MED ORDER — POTASSIUM CHLORIDE CRYS ER 20 MEQ PO TBCR
40.0000 meq | EXTENDED_RELEASE_TABLET | Freq: Once | ORAL | Status: AC
  Administered 2020-03-16: 40 meq via ORAL
  Filled 2020-03-16: qty 2

## 2020-03-16 MED ORDER — ZOLPIDEM TARTRATE 5 MG PO TABS
5.0000 mg | ORAL_TABLET | Freq: Every evening | ORAL | Status: DC | PRN
  Administered 2020-03-16: 5 mg via ORAL
  Filled 2020-03-16: qty 1

## 2020-03-16 MED ORDER — HYDRALAZINE HCL 20 MG/ML IJ SOLN
10.0000 mg | Freq: Once | INTRAMUSCULAR | Status: AC
  Administered 2020-03-16: 10 mg via INTRAVENOUS
  Filled 2020-03-16: qty 1

## 2020-03-16 MED ORDER — SODIUM CHLORIDE 0.9 % IV SOLN
INTRAVENOUS | Status: DC | PRN
  Administered 2020-03-16: 250 mL via INTRAVENOUS

## 2020-03-16 NOTE — Progress Notes (Signed)
PROGRESS NOTE    [name]  BMW:[id number] DOB: 08/12/1950 DOA: 03/10/2020 PCP: Nicoletta Dress, MD   Brief Narrative:  69 year old with history of DM2, status post liver and renal transplant, history of [name] cirrhosis, peripheral neuropathy, morbid obesity presented to the hospital with fevers chills found to be in septic shock secondary to urinary tract infection.  Had left-sided renal stone with hydronephrosis, underwent ureteral stent placement by urology on 9/5.  He was tested positive for COVID-19 on 9/4.  Urine cultures and blood cultures were positive for gram-negative rods, empirically started on cefepime.  Subjective:  Patient in bed overall feels very weak today, says he could not sleep at night, would like to stay another day in the hospital, currently he denies any particular subjective complaints except generalized weakness, no headache, no chest or abdominal pain, no shortness of breath.  Assessment & Plan:    Septic shock secondary to urinary tract infection, gram-negative E. Coli bacteremia from Obstructive nephropathy/renal stone with hydronephrosis, left-sided status post ureteral stent placement 9/5 -   His septic shock has now resolved, he was initially on Levophed which has been titrated off.  He is back to his home dose prednisone and is off of stress dose steroids, blood cultures and urine cultures positive for E. coli.  Continue Rocephin.  Plan is to give him oral Keflex for at least 2 weeks and then to be stopped by urologist Dr. Junious Silk after he sees him.  Foley catheter has been removed and he is voiding well.  He does not feel well at all today, continue to monitor closely with supportive care.   COVID-19 infection  Patient is not hypoxic.  No infiltrates noted on chest x-ray. He is vaccinated COVID 19 ~3 months ago.  This is incidental finding.  Supportive care.      History of renal and liver transplant with history of [name] cirrhosis -Currently  immunosuppressed on CellCept, tacrolimus and steroids  Thrombocytopenia -Chronic, in the setting of known liver disease/[place], at baseline  Hypokalemia - Repleted  Severe deconditioning/wheelchair dependent - Patient with extensive neurosurgical history at [facility] "had undergone suboccipital and cervical decompression versus fenestration by Dr. Dava Najjar postoperative course complicated by concern for hydrocephalus and posterior fossa stroke which he went additional posterior fossa decompression and EVD placement", he is currently wheelchair dependent, he qualified for SNF but refuses to be placed and wants to go home with home health PT.   Diabetes mellitus type 2  With Peripheral neuropathy secondary to DM2 - A1c of 6, continue sliding scale.  Lab Results  Component Value Date   HGBA1C 6.0 (H) 03/10/2020   CBG (last 3)  Recent Labs    03/15/20 1646 03/15/20 2008 03/16/20 0644  GLUCAP 173* 169* 113*      DVT prophylaxis: Lovenox Family communication: updated wife [name] [phone] on 03/16/20 Code Status: Full code  Status is: Inpatient  Remains inpatient appropriate because:Inpatient level of care appropriate due to severity of illness   Dispo: The patient is from: Home              Anticipated d/c is to: Home              Anticipated d/c date is: 1 day              Patient currently is not medically stable to d/c.   Body mass index is 32.08 kg/m.  Pressure Injury 03/10/20 Sacrum Medial Stage II -  Partial thickness loss  of dermis presenting as a shallow open ulcer with a red, pink wound bed without slough. small area of non-intact skin in middle of sacrum surrounded by pink blanchable skin (Active)  03/10/20 1920  Location: Sacrum  Location Orientation: Medial  Staging: Stage II -  Partial thickness loss of dermis presenting as a shallow open ulcer with a red, pink wound bed without slough.  Wound Description (Comments): small area of non-intact skin in middle of  sacrum surrounded by pink blanchable skin  Present on Admission: Yes     Pressure Ulcer 09/14/15 Stage I -  Intact skin with non-blanchable redness of a localized area usually over a bony prominence. (Active)  09/14/15 2030  Location: Ischial tuberosity  Location Orientation: Left;Right  Staging: Stage I -  Intact skin with non-blanchable redness of a localized area usually over a bony prominence.  Wound Description (Comments):   Present on Admission: Yes    Exam   Awake Alert, No new F.N deficits, chronically bedbound due to an old neurological injury,   [place].AT,PERRAL Supple Neck,No JVD, No cervical lymphadenopathy appriciated.  Symmetrical Chest wall movement, Good air movement bilaterally, CTAB RRR,No Gallops, Rubs or new Murmurs, No Parasternal Heave +ve B.Sounds, Abd Soft, No tenderness, No organomegaly appriciated, No rebound - guarding or rigidity. No Cyanosis, Clubbing or edema, No new Rash or bruise    Objective: Vitals:   03/16/20 0254 03/16/20 0321 03/16/20 0600 03/16/20 0802  BP: (!) 173/86 (!) 163/62 (!) 182/81 (!) 156/65  Pulse: 60   65  Resp: 16 17  17   Temp: 98.4 F (36.9 C) 98.3 F (36.8 C)  98.2 F (36.8 C)  TempSrc: Oral Oral  Oral  SpO2: 97% 98%  98%  Weight:      Height:        Intake/Output Summary (Last 24 hours) at 03/16/2020 0930 Last data filed at 03/16/2020 0914 Gross per 24 hour  Intake 3649.6 ml  Output 3400 ml  Net 249.6 ml   Filed Weights   03/10/20 0730 03/10/20 1800  Weight: 102.5 kg 101.4 kg     Data Reviewed:   CBC: Recent Labs  Lab 03/10/20 0815 03/10/20 1058 03/12/20 1154 03/12/20 1154 03/13/20 0520 03/14/20 0500 03/14/20 0609 03/15/20 0750 03/16/20 0506  WBC 3.9*   < > 10.2  --  7.0 4.8  --  3.4* 4.3  NEUTROABS 3.7  --   --   --   --   --   --   --   --   HGB 13.9   < > 10.7*  --  10.0* 11.1*  --  11.3* 12.3*  HCT 45.1   < > 34.1*   < > 30.7* 35.4* 32.7* 35.3* 38.6*  MCV 74.9*   < > 74.5*  --  74.7* 74.7*  --   73.5* 72.6*  PLT 95*   < > 71*  --  68* 85*  --  74* 98*   < > = values in this interval not displayed.   Basic Metabolic Panel: Recent Labs  Lab 03/12/20 1154 03/13/20 0520 03/14/20 0500 03/15/20 0750 03/16/20 0506  NA 143 142 140 140 141  K 3.4* 2.8* 3.5 3.2* 3.2*  CL 114* 112* 112* 106 102  CO2 18* 20* 21* 22 27  GLUCOSE 97 84 74 105* 105*  BUN 43* 38* 27* 15 9  CREATININE 1.94* 1.68* 1.47* 1.45* 1.20  CALCIUM 8.0* 7.9* 8.3* 8.2* 8.4*  MG 1.3* 1.2* 1.4* 1.6*  --  GFR: Estimated Creatinine Clearance: 71.3 mL/min (by C-G formula based on SCr of 1.2 mg/dL). Liver Function Tests: Recent Labs  Lab 03/10/20 0815  AST 31  ALT 19  ALKPHOS 82  BILITOT 1.0  PROT 6.3*  ALBUMIN 3.1*   Recent Labs  Lab 03/10/20 0801  LIPASE 34   Recent Labs  Lab 03/10/20 0801  AMMONIA 48*   Coagulation Profile: Recent Labs  Lab 03/10/20 0815 03/11/20 1242  INR 1.2 1.4*   Cardiac Enzymes: No results for input(s): CKTOTAL, CKMB, CKMBINDEX, TROPONINI in the last 168 hours. BNP (last 3 results) No results for input(s): PROBNP in the last 8760 hours. HbA1C: No results for input(s): HGBA1C in the last 72 hours. CBG: Recent Labs  Lab 03/15/20 0745 03/15/20 1158 03/15/20 1646 03/15/20 2008 03/16/20 0644  GLUCAP 106* 143* 173* 169* 113*   Lipid Profile: No results for input(s): CHOL, HDL, LDLCALC, TRIG, CHOLHDL, LDLDIRECT in the last 72 hours. Thyroid Function Tests: No results for input(s): TSH, T4TOTAL, FREET4, T3FREE, THYROIDAB in the last 72 hours. Anemia Panel: Recent Labs    03/14/20 0500  VITAMINB12 400  FERRITIN 122  TIBC 203*  IRON 28*   Sepsis Labs: Recent Labs  Lab 03/10/20 0801 03/10/20 1047 03/11/20 1242  PROCALCITON  --   --  45.15  LATICACIDVEN 3.2* 3.7*  --     Recent Results (from the past 240 hour(s))  SARS Coronavirus 2 by RT PCR (hospital order, performed in [facility] hospital lab) Nasopharyngeal Nasopharyngeal Swab     Status: Abnormal     Collection Time: 03/10/20  7:40 AM   Specimen: Nasopharyngeal Swab  Result Value Ref Range Status   SARS Coronavirus 2 POSITIVE (A) NEGATIVE Final    Comment: RESULT CALLED TO, READ BACK BY AND VERIFIED WITH: A,MCKOWN RN @1149  03/10/20 EB (NOTE) SARS-CoV-2 target nucleic acids are DETECTED  SARS-CoV-2 RNA is generally detectable in upper respiratory specimens  during the acute phase of infection.  Positive results are indicative  of the presence of the identified virus, but do not rule out bacterial infection or co-infection with other pathogens not detected by the test.  Clinical correlation with patient history and  other diagnostic information is necessary to determine patient infection status.  The expected result is negative.  Fact Sheet for Patients:   StrictlyIdeas.no   Fact Sheet for Healthcare Providers:   BankingDealers.co.za    This test is not yet approved or cleared by the [place] FDA and  has been authorized for detection and/or diagnosis of SARS-CoV-2 by FDA under an Emergency Use Authorization (EUA).  This EUA will remain in effect (meaning this test can  be used) for the duration of  the COVID-19 declaration under Section 564(b)(1) of the Act, 21 U.S.C. section 360-bbb-3(b)(1), unless the authorization is terminated or revoked sooner.  Performed at [facility], [place] [address] [place], [place] [address]   Blood Culture (routine x 2)     Status: Abnormal   Collection Time: 03/10/20  8:01 AM   Specimen: BLOOD  Result Value Ref Range Status   Specimen Description BLOOD SITE NOT SPECIFIED  Final   Special Requests   Final    BOTTLES DRAWN AEROBIC AND ANAEROBIC Blood Culture adequate volume   Culture  Setup Time   Final    GRAM NEGATIVE COCCI AEROBIC BOTTLE ONLY CRITICAL VALUE NOTED.  VALUE IS CONSISTENT WITH PREVIOUSLY REPORTED AND CALLED VALUE. Performed at [facility], [place] [address],  [place], [place] [address]    Culture ESCHERICHIA COLI (A)  Final   Report Status 03/12/2020 FINAL  Final   Organism ID, Bacteria ESCHERICHIA COLI  Final      Susceptibility   Escherichia coli - MIC*    AMPICILLIN 4 SENSITIVE Sensitive     CEFAZOLIN <=4 SENSITIVE Sensitive     CEFEPIME <=0.12 SENSITIVE Sensitive     CEFTAZIDIME <=1 SENSITIVE Sensitive     CEFTRIAXONE <=0.25 SENSITIVE Sensitive     CIPROFLOXACIN <=0.25 SENSITIVE Sensitive     GENTAMICIN <=1 SENSITIVE Sensitive     IMIPENEM <=0.25 SENSITIVE Sensitive     TRIMETH/SULFA <=20 SENSITIVE Sensitive     AMPICILLIN/SULBACTAM <=2 SENSITIVE Sensitive     PIP/TAZO <=4 SENSITIVE Sensitive     * ESCHERICHIA COLI  Urine culture     Status: Abnormal   Collection Time: 03/10/20 11:25 AM   Specimen: In/Out Cath Urine  Result Value Ref Range Status   Specimen Description IN/OUT CATH URINE  Final   Special Requests   Final    NONE Performed at [facility], [place] [address], [place], [place] [address]    Culture >=100,000 COLONIES/mL ESCHERICHIA COLI (A)  Final   Report Status 03/12/2020 FINAL  Final   Organism ID, Bacteria ESCHERICHIA COLI (A)  Final      Susceptibility   Escherichia coli - MIC*    AMPICILLIN 4 SENSITIVE Sensitive     CEFAZOLIN <=4 SENSITIVE Sensitive     CEFTRIAXONE <=0.25 SENSITIVE Sensitive     CIPROFLOXACIN <=0.25 SENSITIVE Sensitive     GENTAMICIN <=1 SENSITIVE Sensitive     IMIPENEM <=0.25 SENSITIVE Sensitive     NITROFURANTOIN <=16 SENSITIVE Sensitive     TRIMETH/SULFA <=20 SENSITIVE Sensitive     AMPICILLIN/SULBACTAM 4 SENSITIVE Sensitive     PIP/TAZO <=4 SENSITIVE Sensitive     * >=100,000 COLONIES/mL ESCHERICHIA COLI  Blood Culture (routine x 2)     Status: Abnormal   Collection Time: 03/10/20  2:28 PM   Specimen: BLOOD RIGHT HAND  Result Value Ref Range Status   Specimen Description BLOOD RIGHT HAND  Final   Special Requests   Final    BOTTLES DRAWN AEROBIC AND ANAEROBIC Blood Culture results  may not be optimal due to an inadequate volume of blood received in culture bottles   Culture  Setup Time   Final    GRAM NEGATIVE RODS ANAEROBIC BOTTLE ONLY CRITICAL RESULT CALLED TO, READ BACK BY AND VERIFIED WITH: PHARMD LAURA SEAY AT 2348 BY MESSAN H. ON 03/10/2020    Culture (A)  Final    ESCHERICHIA COLI SUSCEPTIBILITIES PERFORMED ON PREVIOUS CULTURE WITHIN THE LAST 5 DAYS. Performed at [facility], [place] [address] [place], [place] [address]    Report Status 03/12/2020 FINAL  Final  Blood Culture ID Panel (Reflexed)     Status: Abnormal   Collection Time: 03/10/20  2:28 PM  Result Value Ref Range Status   Enterococcus faecalis NOT DETECTED NOT DETECTED Final   Enterococcus Faecium NOT DETECTED NOT DETECTED Final   Listeria monocytogenes NOT DETECTED NOT DETECTED Final   Staphylococcus species NOT DETECTED NOT DETECTED Final   Staphylococcus aureus (BCID) NOT DETECTED NOT DETECTED Final   Staphylococcus epidermidis NOT DETECTED NOT DETECTED Final   Staphylococcus lugdunensis NOT DETECTED NOT DETECTED Final   Streptococcus species NOT DETECTED NOT DETECTED Final   Streptococcus agalactiae NOT DETECTED NOT DETECTED Final   Streptococcus pneumoniae NOT DETECTED NOT DETECTED Final  Streptococcus pyogenes NOT DETECTED NOT DETECTED Final   A.calcoaceticus-baumannii NOT DETECTED NOT DETECTED Final   Bacteroides fragilis NOT DETECTED NOT DETECTED Final   Enterobacterales DETECTED (A) NOT DETECTED Final    Comment: Enterobacterales represent a large order of gram negative bacteria, not a single organism. CRITICAL RESULT CALLED TO, READ BACK BY AND VERIFIED WITH: PHARMD LAURA SEAY AT 2348 BY MESSAN H. ON 03/10/2020    Enterobacter cloacae complex NOT DETECTED NOT DETECTED Final   Escherichia coli DETECTED (A) NOT DETECTED Final    Comment: CRITICAL RESULT CALLED TO, READ BACK BY AND VERIFIED WITH: PHARMD LAURA SEAY AT 2348 BY MESSAN H. ON 03/10/2020    Klebsiella aerogenes NOT  DETECTED NOT DETECTED Final   Klebsiella oxytoca NOT DETECTED NOT DETECTED Final   Klebsiella pneumoniae NOT DETECTED NOT DETECTED Final   Proteus species NOT DETECTED NOT DETECTED Final   Salmonella species NOT DETECTED NOT DETECTED Final   Serratia marcescens NOT DETECTED NOT DETECTED Final   Haemophilus influenzae NOT DETECTED NOT DETECTED Final   Neisseria meningitidis NOT DETECTED NOT DETECTED Final   Pseudomonas aeruginosa NOT DETECTED NOT DETECTED Final   Stenotrophomonas maltophilia NOT DETECTED NOT DETECTED Final   Candida albicans NOT DETECTED NOT DETECTED Final   Candida auris NOT DETECTED NOT DETECTED Final   Candida glabrata NOT DETECTED NOT DETECTED Final   Candida krusei NOT DETECTED NOT DETECTED Final   Candida parapsilosis NOT DETECTED NOT DETECTED Final   Candida tropicalis NOT DETECTED NOT DETECTED Final   Cryptococcus neoformans/gattii NOT DETECTED NOT DETECTED Final   CTX-M ESBL NOT DETECTED NOT DETECTED Final   Carbapenem resistance IMP NOT DETECTED NOT DETECTED Final   Carbapenem resistance KPC NOT DETECTED NOT DETECTED Final   Carbapenem resistance NDM NOT DETECTED NOT DETECTED Final   Carbapenem resist OXA 48 LIKE NOT DETECTED NOT DETECTED Final   Carbapenem resistance VIM NOT DETECTED NOT DETECTED Final    Comment: Performed at [facility], [address], [place], [place] [address]  MRSA PCR Screening     Status: Abnormal   Collection Time: 03/10/20  6:08 PM   Specimen: Nasopharyngeal  Result Value Ref Range Status   MRSA by PCR POSITIVE (A) NEGATIVE Final    Comment:        The GeneXpert MRSA Assay (FDA approved for NASAL specimens only), is one component of a comprehensive MRSA colonization surveillance program. It is not intended to diagnose MRSA infection nor to guide or monitor treatment for MRSA infections. RESULT CALLED TO, READ BACK BY AND VERIFIED WITH: NATALE,G RN AT 2105 03/10/2020 MITCHELL,L Performed at [facility],  [place] [address], [place], [place] [address]   Culture, blood (routine x 2)     Status: None   Collection Time: 03/10/20  6:52 PM   Specimen: BLOOD LEFT HAND  Result Value Ref Range Status   Specimen Description BLOOD LEFT HAND  Final   Special Requests   Final    BOTTLES DRAWN AEROBIC AND ANAEROBIC Blood Culture results may not be optimal due to an inadequate volume of blood received in culture bottles   Culture   Final    NO GROWTH 5 DAYS Performed at [facility], [place] [address] [place], [place] [address]    Report Status 03/15/2020 FINAL  Final  Culture, blood (routine x 2)     Status: None   Collection Time: 03/10/20  7:05 PM   Specimen: BLOOD  Result Value Ref Range Status  Specimen Description BLOOD RIGHT THUMB  Final   Special Requests   Final    BOTTLES DRAWN AEROBIC ONLY Blood Culture results may not be optimal due to an inadequate volume of blood received in culture bottles   Culture   Final    NO GROWTH 5 DAYS Performed at [facility], [place] [address] [place], [place] [address]    Report Status 03/15/2020 FINAL  Final      Radiology Studies: DG CHEST PORT 1 VIEW  Result Date: 03/16/2020 CLINICAL DATA:  Sudden chest discomfort. Fevers and chills. Left-sided renal stone. EXAM: PORTABLE CHEST 1 VIEW COMPARISON:  03/10/2020 FINDINGS: Shallow inspiration. Heart size and pulmonary vascularity are normal. Suggestion of mild infiltration or atelectasis in the lung bases. No pleural effusions. No pneumothorax. Mediastinal contours appear intact. IMPRESSION: Shallow inspiration with mild infiltration or atelectasis in the lung bases. Electronically Signed   By: Lucienne Capers M.D.   On: 03/16/2020 04:14   Scheduled Meds: . aspirin EC  81 mg Oral Daily  . Chlorhexidine Gluconate Cloth  6 each Topical Daily  . enoxaparin (LOVENOX) injection  40 mg Subcutaneous Q24H  . insulin aspart  0-9 Units Subcutaneous TID WC  . mycophenolate  500 mg Oral BID  . pantoprazole  40  mg Oral Daily  . predniSONE  5 mg Oral Daily  . pregabalin  100 mg Oral QHS  . pregabalin  50 mg Oral 2 times per day  . sertraline  50 mg Oral Daily  . Tacrolimus ER  4 mg Oral Daily  . traZODone  50 mg Oral QHS   Continuous Infusions: .  ceFAZolin (ANCEF) IV Stopped (03/16/20 0703)  . sodium chloride     And  . sodium chloride       LOS: 6 days     Lala Lund, MD [facility]  If 7PM-7AM, please contact night-coverage  03/16/2020, 9:30 AM

## 2020-03-16 NOTE — TOC Progression Note (Signed)
Transition of Care [facility]) - Progression Note    Patient Details  Name: [name] MRN: [id number] Date of Birth: [birth year]  Transition of Care [facility]) CM/SW [place], RN Phone Number: 03/16/2020, 3:40 PM  Clinical Narrative:    Marijean Heath, RN spoke with case management and states that the patient is not feeling well enough to go home today.  I talked with Krystal Eaton, MSW with [facility] and the patient will still be able to receive [facility] transportation and care over the weekend if discharged over the weekend.  The RN Case Manager with TOC will need to call the [facility] on call service number and dial [phone] and press option 2 to be sent to the RN on call with [facility].  The [facility] on call will be able to arrange transportation for the patient or have the patient be sent home by [facility] if needed.  Please fax discharge summary to [phone] if patient is indeed discharge over the weekend.   Expected Discharge Plan: [place] Barriers to Discharge: Continued Medical Work up  Expected Discharge Plan and Services Expected Discharge Plan: [place] In-house Referral:  ([place] patient, all DME and [place] via [facility]) Discharge Planning Services: CM Consult Post Acute Care Choice: Durable Medical Equipment (Called and spoke with Krystal Eaton - CM with [facility] - aware pt will most likely need Home O2) Living arrangements for the past 2 months: Single Family Home Expected Discharge Date: 03/16/20               DME Arranged: Oxygen ([facility] will provide Home O2 if needed - made aware) DME Agency:  ([facility] to set up O2 if needed)       HH Arranged:  ([facility])           Social Determinants of Health (SDOH) Interventions    Readmission Risk Interventions Readmission Risk Prevention Plan 03/13/2020  Transportation Screening Complete  PCP or Specialist Appt within 3-5 Days Complete  HRI or Home Care Consult Complete  Social Work Consult for [place]  Planning/Counseling [place] Screening Not Applicable  Medication Review Press photographer) Referral to Pharmacy  Some recent data might be hidden

## 2020-03-16 NOTE — TOC Progression Note (Signed)
Transition of Care [facility]) - Progression Note    Patient Details  Name: [name] MRN: [id number] Date of Birth: [birth year]  Transition of Care [facility]) CM/SW [place], RN Phone Number: 03/16/2020, 2:52 PM  Clinical Narrative:    Case Management spoke with Krystal Eaton, MSW with [facility] [phone] and updated her on the patient's issues with CP and SOB last night and anxiety this morning.  The patient will most likely not be discharge home with morning with [facility] and may need Home O2 if not weaned off of oxygen.  The patient currently stays at home with his wife with assistance from home health aides through [facility], hospital bed, wheelchair and hoyer lift.  The patient was recently discharged from a nursing facility to home.  The patient spends the night at his home with his wife but is cared for at an [place] Mon-Friday through [facility].  [facility] will need to transport him home by Lucianne Lei and wheelchair when he is able to be discharged home.  [facility] does not ususally admit patients to home over the weekend so if the patient does not discharge home today he most likely will be discharged home on Monday.  The clinic that oversees his care needs operates Mon-Fri.  Will continue to follow for patient's discharge needs.   Expected Discharge Plan: [place] Barriers to Discharge: Continued Medical Work up  Expected Discharge Plan and Services Expected Discharge Plan: [place] In-house Referral:  ([place] patient, all DME and [place] via [facility]) Discharge Planning Services: CM Consult Post Acute Care Choice: Durable Medical Equipment (Called and spoke with Krystal Eaton - CM with [facility] - aware pt will most likely need Home O2) Living arrangements for the past 2 months: Single Family Home Expected Discharge Date: 03/16/20               DME Arranged: Oxygen ([facility] will provide Home O2 if needed - made aware) DME Agency:  ([facility] to set up O2 if needed)        HH Arranged:  ([facility])           Social Determinants of Health (SDOH) Interventions    Readmission Risk Interventions Readmission Risk Prevention Plan 03/13/2020  Transportation Screening Complete  PCP or Specialist Appt within 3-5 Days Complete  HRI or Home Care Consult Complete  Social Work Consult for [place] Planning/Counseling [place] Screening Not Applicable  Medication Review Press photographer) Referral to Pharmacy  Some recent data might be hidden

## 2020-03-16 NOTE — Progress Notes (Signed)
Pt c/o "weight on my chest" and short of breath.  VS taken, O2 97%, variable HR.  Pain 7/10, pt points to left upper quadrant of abdomen.    Placed pt on 2L O2 [place] for comfort/reassurance.  MD notified, orders received/  EKG completed.  Pt HR fluctuating from high 40s to low 80s.

## 2020-03-17 LAB — GLUCOSE, CAPILLARY
Glucose-Capillary: 107 mg/dL — ABNORMAL HIGH (ref 70–99)
Glucose-Capillary: 155 mg/dL — ABNORMAL HIGH (ref 70–99)

## 2020-03-17 LAB — CREATININE, SERUM
Creatinine, Ser: 1.19 mg/dL (ref 0.61–1.24)
GFR calc Af Amer: >60 mL/min (ref >60)
GFR calc non Af Amer: >60 mL/min (ref >60)

## 2020-03-17 LAB — PROCALCITONIN: Procalcitonin: 1.12 ng/mL

## 2020-03-17 MED ORDER — POTASSIUM CHLORIDE CRYS ER 20 MEQ PO TBCR
20.0000 meq | EXTENDED_RELEASE_TABLET | Freq: Once | ORAL | Status: AC
  Administered 2020-03-17: 20 meq via ORAL
  Filled 2020-03-17: qty 1

## 2020-03-17 MED ORDER — CEPHALEXIN 500 MG PO CAPS: 500.0000 mg | ORAL_CAPSULE | Freq: Four times a day (QID) | ORAL | 0 refills | Status: AC

## 2020-03-17 MED ORDER — ALBUTEROL SULFATE HFA 108 (90 BASE) MCG/ACT IN AERS
2.0000 | INHALATION_SPRAY | Freq: Four times a day (QID) | RESPIRATORY_TRACT | 0 refills | Status: DC | PRN
Start: 2020-03-17 — End: 2024-01-25

## 2020-03-17 NOTE — Discharge Summary (Signed)
[name] BCW:[id number] DOB: December 24, 1950 DOA: 03/10/2020  PCP: Nicoletta Dress, MD  Admit date: 03/10/2020  Discharge date: 03/17/2020  Admitted From: Home   disposition: Home, refused SNF   Recommendations for Outpatient Follow-up:   Follow up with PCP in 1-2 weeks  PCP Please obtain BMP/CBC, 2 view CXR in 1week,  (see Discharge instructions)   PCP Please follow up on the following pending results: Must follow with urology within a week of discharge, check CBC BMP next visit.   Home Health: RN, PT Equipment/Devices: No new equipment Consultations: Urologist Dr. Junious Silk Discharge Condition: Stable    CODE STATUS: Full    Diet Recommendation: Heart Healthy low carbohydrate    Chief Complaint  Patient presents with  . Cough  . Nausea     Brief history of present illness from the day of admission and additional interim summary    69 year old with history of DM2, status post liver and renal transplant, history of [name] cirrhosis, peripheral neuropathy, morbid obesity presented to the hospital with fevers chills found to be in septic shock secondary to urinary tract infection.  Had left-sided renal stone with hydronephrosis, underwent ureteral stent placement by urology on 9/5.  He was tested positive for COVID-19 on 9/4.  Urine cultures and blood cultures were positive for gram-negative rods, empirically started on cefepime.                                                                  Hospital Course    Septic shock secondary to urinary tract infection, gram-negative E. Coli bacteremia from Obstructive nephropathy/renal stone with hydronephrosis, left-sided status post ureteral stent placement 9/5 -   His septic shock has now resolved, he was initially on Levophed which has been titrated off.  He is  back to his home dose prednisone and is off of stress dose steroids, blood cultures and urine cultures positive for E. coli.  Received IV Rocephin here.  Plan is to give him oral Keflex for at least 2 weeks and then to be stopped by urologist Dr. Junious Silk after he sees him.  Foley catheter has been removed and he is voiding well.  He feels a whole lot better today and will be discharged with 2 more weeks of oral Keflex with outpatient follow-up by Dr. Junious Silk.  He must see Dr. Junious Silk within a week of discharge.   COVID-19 infection  Patient is not hypoxic.  No infiltrates noted on chest x-ray. He is vaccinated COVID 19 ~3 months ago.  This is incidental finding.  Supportive care.      History of renal and liver transplant with history of [name] cirrhosis -Currently immunosuppressed on CellCept, tacrolimus and steroids, PCP to check CMP in 7 to 10 days of discharge.  Follow with primary nephrologist within  1 to 2 weeks.  Thrombocytopenia -Chronic, in the setting of known liver disease/[place], at baseline, PCP to monitor along with synthetic function intermittently.  Hypokalemia - Repleted  Severe deconditioning/wheelchair dependent - Patient with extensive neurosurgical history at [facility] "had undergone suboccipital and cervical decompression versus fenestration by Dr. Dava Najjar postoperative course complicated by concern for hydrocephalus and posterior fossa stroke which he went additional posterior fossa decompression and EVD placement", he is currently wheelchair dependent, he qualified for SNF but refuses to be placed and wants to go home with home health PT. will be discharged today with home health PT.   Diabetes mellitus type 2  With Peripheral neuropathy secondary to DM2 - A1c of 6, continue home regimen upon discharge.   Discharge diagnosis     Active Problems:   Acute renal failure superimposed on stage 3 chronic kidney disease (HCC)   Controlled type 2 diabetes mellitus with  diabetic nephropathy, without long-term current use of insulin (HCC)   Diabetic neuropathy (HCC)   Septic shock (HCC)   Renal transplant recipient   Liver transplanted ([place])   Sepsis secondary to UTI ([place])   Bacteremia due to Gram-negative bacteria   E. coli UTI (urinary tract infection)   Ureteral stone with hydronephrosis   COVID-19    Discharge instructions    Discharge Instructions    Discharge instructions   Complete by: As directed    Follow with Primary MD Nicoletta Dress, MD in 7 days   Get CBC, CMP, 2 view Chest X ray -  checked next visit within 1 week by Primary MD   Activity: As tolerated with Full fall precautions use walker/cane & assistance as needed  Disposition Home   Diet: Heart Healthy  Low Carb  Special Instructions: If you have smoked or chewed Tobacco  in the last 2 yrs please stop smoking, stop any regular Alcohol  and or any Recreational drug use.  On your next visit with your primary care physician please Get Medicines reviewed and adjusted.  Please request your Prim.MD to go over all Hospital Tests and Procedure/Radiological results at the follow up, please get all Hospital records sent to your Prim MD by signing hospital release before you go home.  If you experience worsening of your admission symptoms, develop shortness of breath, life threatening emergency, suicidal or homicidal thoughts you must seek medical attention immediately by calling 911 or calling your MD immediately  if symptoms less severe.  You Must read complete instructions/literature along with all the possible adverse reactions/side effects for all the Medicines you take and that have been prescribed to you. Take any new Medicines after you have completely understood and accpet all the possible adverse reactions/side effects.   Increase activity slowly   Complete by: As directed    MyChart COVID-19 home monitoring program   Complete by: Mar 17, 2020    Is the patient willing to  use the [place] for home monitoring?: Yes   No wound care   Complete by: As directed    Temperature monitoring   Complete by: Mar 17, 2020    After how many days would you like to receive a notification of this patient's flowsheet entries?: 1      Discharge Medications   Allergies as of 03/17/2020   No Known Allergies     Medication List    TAKE these medications   acetaminophen 325 MG tablet Commonly known as: TYLENOL Take 650 mg by mouth every 8 (  eight) hours as needed.   albuterol 108 (90 Base) MCG/ACT inhaler Commonly known as: VENTOLIN HFA Inhale 2 puffs into the lungs every 6 (six) hours as needed for wheezing or shortness of breath.   aspirin EC 81 MG tablet Take 81 mg by mouth daily.   cephALEXin 500 MG capsule Commonly known as: KEFLEX Take 1 capsule (500 mg total) by mouth 4 (four) times daily for 14 days. Continue through 03/23/2020   Envarsus XR 1 MG Tb24 Generic drug: Tacrolimus ER Take 4 mg by mouth daily.   HumaLOG KwikPen 100 UNIT/ML KwikPen Generic drug: insulin lispro Inject 24-26 Units into the skin in the morning and at bedtime. Use 24 units in the AM and 26 units at bedtime   mycophenolate 250 MG capsule Commonly known as: CELLCEPT Take 500 mg by mouth 2 (two) times daily.   omeprazole 20 MG capsule Commonly known as: PRILOSEC Take 20 mg by mouth 2 (two) times daily.   predniSONE 5 MG tablet Commonly known as: DELTASONE Take 5 mg by mouth daily.   pregabalin 50 MG capsule Commonly known as: LYRICA TAKE 1 CAPSULE BY MOUTH IN THE MORNING, TAKE 1 CAPSULE MIDDAY AND 2 IN THE EVENING What changed:   how much to take  how to take this  when to take this  additional instructions   sertraline 50 MG tablet Commonly known as: ZOLOFT Take 50 mg by mouth daily.   Toujeo SoloStar 300 UNIT/ML Solostar Pen Generic drug: insulin glargine (1 Unit Dial) Inject 48 Units into the skin at bedtime.   traZODone 50 MG tablet Commonly  known as: DESYREL Take 50 mg by mouth at bedtime.        Follow-up Information    Call Festus Aloe, MD.   Specialty: Urology Contact information: [place] [address] [phone]        Nicoletta Dress, MD. Schedule an appointment as soon as possible for a visit in 1 week(s).   Specialty: Internal Medicine Why: You must see the urologist before you run out of Keflex antibiotics. Contact information: [place] [address] [phone]               Major procedures and Radiology Reports - PLEASE review detailed and final reports thoroughly  -       CT ABDOMEN PELVIS WO CONTRAST  Result Date: 03/10/2020 CLINICAL DATA:  Nausea, vomiting, constipation and fever for 4 days. History of kidney and liver transplant. EXAM: CT ABDOMEN AND PELVIS WITHOUT CONTRAST TECHNIQUE: Multidetector CT imaging of the abdomen and pelvis was performed following the standard protocol without IV contrast. COMPARISON:  CT abdomen dated 02/22/2007. FINDINGS: Lower chest: No acute abnormality. Hepatobiliary: No focal liver abnormality. No bile duct dilatation. Gallbladder not seen, presumed cholecystectomy. Pancreas: Unremarkable. No pancreatic ductal dilatation or surrounding inflammatory changes. Spleen: Stable splenomegaly. Adrenals/Urinary Tract: At least 2, probably 3 or 4, abutting stones within the mid LEFT ureter, L4-L5 vertebral body level, largest stone measuring 7 mm, causing moderate LEFT-sided hydronephrosis and associated perinephric edema. Benign-appearing cyst exophytic to the lower pole of the RIGHT kidney, measuring approximately 4 cm greatest dimension. No RIGHT-sided renal stone or hydronephrosis. No RIGHT-sided ureteral stone. Bladder is unremarkable, partially decompressed. Transplant kidney within the RIGHT pelvis appears normal, with expected mild pelviectasis. Stomach/Bowel: No dilated large or small bowel loops. No evidence of  bowel wall inflammation. Appendix is normal. Vascular/Lymphatic: No significant vascular findings are present. No enlarged abdominal  or pelvic lymph nodes. Reproductive: Prostate is unremarkable. Other: No free fluid or abscess collection. No free intraperitoneal air. Musculoskeletal: Degenerative spondylosis of the lumbar spine, mild to moderate in degree. No acute or suspicious osseous finding. Umbilical abdominal wall hernia which contains fat only. IMPRESSION: 1. At least 2, probably 3 or more, abutting stones within the mid LEFT ureter, largest stone measuring 7 mm, causing moderate LEFT-sided hydronephrosis and associated perinephric edema. 2. Transplant kidney within the RIGHT pelvis appears normal, with expected mild pelviectasis. 3. Stable splenomegaly. 4. Umbilical abdominal wall hernia which contains fat only. 5. Additional chronic/incidental findings detailed above. Electronically Signed   By: Franki Cabot M.D.   On: 03/10/2020 10:17   DG Retrograde Pyelogram  Result Date: 03/10/2020 CLINICAL DATA:  Portable fluoroscopic imaging for retrograde ureteral pyelography. EXAM: DG C-ARM 1-60 MIN; RETROGRADE PYELOGRAM FLUOROSCOPY TIME:  Fluoroscopy Time:  0 minutes and 31 seconds Radiation Exposure Index (if provided by the fluoroscopic device): 9.3 mGy Number of Acquired Spot Images: 5 COMPARISON:  CT, 03/10/2020 FINDINGS: Images show the retrograde injection of the left ureter. There is an oval filling defect in the distal ureter with a more elongated filling defect in the proximal ureter. At least 1 filling defect is noted in a opacified left renal calyx. IMPRESSION: Imaging provided for left retrograde ureteral pyelography. Electronically Signed   By: Lajean Manes M.D.   On: 03/10/2020 17:09   DG CHEST PORT 1 VIEW  Result Date: 03/16/2020 CLINICAL DATA:  Sudden chest discomfort. Fevers and chills. Left-sided renal stone. EXAM: PORTABLE CHEST 1 VIEW COMPARISON:  03/10/2020 FINDINGS: Shallow  inspiration. Heart size and pulmonary vascularity are normal. Suggestion of mild infiltration or atelectasis in the lung bases. No pleural effusions. No pneumothorax. Mediastinal contours appear intact. IMPRESSION: Shallow inspiration with mild infiltration or atelectasis in the lung bases. Electronically Signed   By: Lucienne Capers M.D.   On: 03/16/2020 04:14   DG Chest Portable 1 View  Result Date: 03/10/2020 CLINICAL DATA:  Central line placement. EXAM: PORTABLE CHEST 1 VIEW COMPARISON:  Radiographs 03/10/2020 and 09/14/2015. FINDINGS: 1314 hours. Right IJ central venous catheter projects to the level of the upper SVC. The heart size and mediastinal contours are stable. There is no pneumothorax or significant pleural effusion. There are persistent low lung volumes with mild pulmonary interstitial prominence. No focal airspace disease or edema. The bones appear unchanged. Telemetry leads overlie the chest. IMPRESSION: Central venous catheter placement as described. No pneumothorax or other significant changes. Electronically Signed   By: Richardean Sale M.D.   On: 03/10/2020 13:49   DG Chest Port 1 View  Result Date: 03/10/2020 CLINICAL DATA:  Questionable sepsis EXAM: PORTABLE CHEST 1 VIEW COMPARISON:  09/14/2015 FINDINGS: Normal heart size and mediastinal contours. Prominent lung markings at the bases, but stable. There is no edema, consolidation, effusion, or pneumothorax. IMPRESSION: Stable exam.  No acute finding. Electronically Signed   By: Monte Fantasia M.D.   On: 03/10/2020 08:03   DG C-Arm 1-60 Min  Result Date: 03/10/2020 CLINICAL DATA:  Portable fluoroscopic imaging for retrograde ureteral pyelography. EXAM: DG C-ARM 1-60 MIN; RETROGRADE PYELOGRAM FLUOROSCOPY TIME:  Fluoroscopy Time:  0 minutes and 31 seconds Radiation Exposure Index (if provided by the fluoroscopic device): 9.3 mGy Number of Acquired Spot Images: 5 COMPARISON:  CT, 03/10/2020 FINDINGS: Images show the retrograde injection  of the left ureter. There is an oval filling defect in the distal ureter with a more elongated filling defect in the proximal ureter.  At least 1 filling defect is noted in a opacified left renal calyx. IMPRESSION: Imaging provided for left retrograde ureteral pyelography. Electronically Signed   By: Lajean Manes M.D.   On: 03/10/2020 17:09    Micro Results     Recent Results (from the past 240 hour(s))  SARS Coronavirus 2 by RT PCR (hospital order, performed in [facility] hospital lab) Nasopharyngeal Nasopharyngeal Swab     Status: Abnormal   Collection Time: 03/10/20  7:40 AM   Specimen: Nasopharyngeal Swab  Result Value Ref Range Status   SARS Coronavirus 2 POSITIVE (A) NEGATIVE Final    Comment: RESULT CALLED TO, READ BACK BY AND VERIFIED WITH: A,MCKOWN RN @1149  03/10/20 EB (NOTE) SARS-CoV-2 target nucleic acids are DETECTED  SARS-CoV-2 RNA is generally detectable in upper respiratory specimens  during the acute phase of infection.  Positive results are indicative  of the presence of the identified virus, but do not rule out bacterial infection or co-infection with other pathogens not detected by the test.  Clinical correlation with patient history and  other diagnostic information is necessary to determine patient infection status.  The expected result is negative.  Fact Sheet for Patients:   StrictlyIdeas.no   Fact Sheet for Healthcare Providers:   BankingDealers.co.za    This test is not yet approved or cleared by the [place] FDA and  has been authorized for detection and/or diagnosis of SARS-CoV-2 by FDA under an Emergency Use Authorization (EUA).  This EUA will remain in effect (meaning this test can  be used) for the duration of  the COVID-19 declaration under Section 564(b)(1) of the Act, 21 U.S.C. section 360-bbb-3(b)(1), unless the authorization is terminated or revoked sooner.  Performed at [facility], [place] [address], [place], [place] [address]   Blood Culture (routine x 2)     Status: Abnormal   Collection Time: 03/10/20  8:01 AM   Specimen: BLOOD  Result Value Ref Range Status   Specimen Description BLOOD SITE NOT SPECIFIED  Final   Special Requests   Final    BOTTLES DRAWN AEROBIC AND ANAEROBIC Blood Culture adequate volume   Culture  Setup Time   Final    GRAM NEGATIVE COCCI AEROBIC BOTTLE ONLY CRITICAL VALUE NOTED.  VALUE IS CONSISTENT WITH PREVIOUSLY REPORTED AND CALLED VALUE. Performed at [facility], [place] [address], [place], [place] [address]    Culture ESCHERICHIA COLI (A)  Final   Report Status 03/12/2020 FINAL  Final   Organism ID, Bacteria ESCHERICHIA COLI  Final      Susceptibility   Escherichia coli - MIC*    AMPICILLIN 4 SENSITIVE Sensitive     CEFAZOLIN <=4 SENSITIVE Sensitive     CEFEPIME <=0.12 SENSITIVE Sensitive     CEFTAZIDIME <=1 SENSITIVE Sensitive     CEFTRIAXONE <=0.25 SENSITIVE Sensitive     CIPROFLOXACIN <=0.25 SENSITIVE Sensitive     GENTAMICIN <=1 SENSITIVE Sensitive     IMIPENEM <=0.25 SENSITIVE Sensitive     TRIMETH/SULFA <=20 SENSITIVE Sensitive     AMPICILLIN/SULBACTAM <=2 SENSITIVE Sensitive     PIP/TAZO <=4 SENSITIVE Sensitive     * ESCHERICHIA COLI  Urine culture     Status: Abnormal   Collection Time: 03/10/20 11:25 AM   Specimen: In/Out Cath Urine  Result Value Ref Range Status   Specimen Description IN/OUT CATH URINE  Final   Special Requests   Final    NONE Performed at [facility], [place]  [address] [place], [place] [address]    Culture >=100,000 COLONIES/mL ESCHERICHIA COLI (A)  Final   Report Status 03/12/2020 FINAL  Final   Organism ID, Bacteria ESCHERICHIA COLI (A)  Final      Susceptibility   Escherichia coli - MIC*    AMPICILLIN 4 SENSITIVE Sensitive     CEFAZOLIN <=4 SENSITIVE Sensitive     CEFTRIAXONE <=0.25 SENSITIVE Sensitive     CIPROFLOXACIN <=0.25 SENSITIVE Sensitive     GENTAMICIN <=1 SENSITIVE  Sensitive     IMIPENEM <=0.25 SENSITIVE Sensitive     NITROFURANTOIN <=16 SENSITIVE Sensitive     TRIMETH/SULFA <=20 SENSITIVE Sensitive     AMPICILLIN/SULBACTAM 4 SENSITIVE Sensitive     PIP/TAZO <=4 SENSITIVE Sensitive     * >=100,000 COLONIES/mL ESCHERICHIA COLI  Blood Culture (routine x 2)     Status: Abnormal   Collection Time: 03/10/20  2:28 PM   Specimen: BLOOD RIGHT HAND  Result Value Ref Range Status   Specimen Description BLOOD RIGHT HAND  Final   Special Requests   Final    BOTTLES DRAWN AEROBIC AND ANAEROBIC Blood Culture results may not be optimal due to an inadequate volume of blood received in culture bottles   Culture  Setup Time   Final    GRAM NEGATIVE RODS ANAEROBIC BOTTLE ONLY CRITICAL RESULT CALLED TO, READ BACK BY AND VERIFIED WITH: PHARMD LAURA SEAY AT 2348 BY MESSAN H. ON 03/10/2020    Culture (A)  Final    ESCHERICHIA COLI SUSCEPTIBILITIES PERFORMED ON PREVIOUS CULTURE WITHIN THE LAST 5 DAYS. Performed at [facility], [place] [address], [place], [place] [address]    Report Status 03/12/2020 FINAL  Final  Blood Culture ID Panel (Reflexed)     Status: Abnormal   Collection Time: 03/10/20  2:28 PM  Result Value Ref Range Status   Enterococcus faecalis NOT DETECTED NOT DETECTED Final   Enterococcus Faecium NOT DETECTED NOT DETECTED Final   Listeria monocytogenes NOT DETECTED NOT DETECTED Final   Staphylococcus species NOT DETECTED NOT DETECTED Final   Staphylococcus aureus (BCID) NOT DETECTED NOT DETECTED Final   Staphylococcus epidermidis NOT DETECTED NOT DETECTED Final   Staphylococcus lugdunensis NOT DETECTED NOT DETECTED Final   Streptococcus species NOT DETECTED NOT DETECTED Final   Streptococcus agalactiae NOT DETECTED NOT DETECTED Final   Streptococcus pneumoniae NOT DETECTED NOT DETECTED Final   Streptococcus pyogenes NOT DETECTED NOT DETECTED Final   A.calcoaceticus-baumannii NOT DETECTED NOT DETECTED Final   Bacteroides fragilis NOT DETECTED  NOT DETECTED Final   Enterobacterales DETECTED (A) NOT DETECTED Final    Comment: Enterobacterales represent a large order of gram negative bacteria, not a single organism. CRITICAL RESULT CALLED TO, READ BACK BY AND VERIFIED WITH: PHARMD LAURA SEAY AT 2348 BY MESSAN H. ON 03/10/2020    Enterobacter cloacae complex NOT DETECTED NOT DETECTED Final   Escherichia coli DETECTED (A) NOT DETECTED Final    Comment: CRITICAL RESULT CALLED TO, READ BACK BY AND VERIFIED WITH: PHARMD LAURA SEAY AT 2348 BY MESSAN H. ON 03/10/2020    Klebsiella aerogenes NOT DETECTED NOT DETECTED Final   Klebsiella oxytoca NOT DETECTED NOT DETECTED Final   Klebsiella pneumoniae NOT DETECTED NOT DETECTED Final   Proteus species NOT DETECTED NOT DETECTED Final   Salmonella species NOT DETECTED NOT DETECTED Final   Serratia marcescens NOT DETECTED NOT DETECTED Final   Haemophilus influenzae NOT DETECTED NOT DETECTED Final   Neisseria meningitidis NOT DETECTED NOT DETECTED Final   Pseudomonas aeruginosa  NOT DETECTED NOT DETECTED Final   Stenotrophomonas maltophilia NOT DETECTED NOT DETECTED Final   Candida albicans NOT DETECTED NOT DETECTED Final   Candida auris NOT DETECTED NOT DETECTED Final   Candida glabrata NOT DETECTED NOT DETECTED Final   Candida krusei NOT DETECTED NOT DETECTED Final   Candida parapsilosis NOT DETECTED NOT DETECTED Final   Candida tropicalis NOT DETECTED NOT DETECTED Final   Cryptococcus neoformans/gattii NOT DETECTED NOT DETECTED Final   CTX-M ESBL NOT DETECTED NOT DETECTED Final   Carbapenem resistance IMP NOT DETECTED NOT DETECTED Final   Carbapenem resistance KPC NOT DETECTED NOT DETECTED Final   Carbapenem resistance NDM NOT DETECTED NOT DETECTED Final   Carbapenem resist OXA 48 LIKE NOT DETECTED NOT DETECTED Final   Carbapenem resistance VIM NOT DETECTED NOT DETECTED Final    Comment: Performed at [facility], [address] [place], [place] [address]  MRSA PCR Screening      Status: Abnormal   Collection Time: 03/10/20  6:08 PM   Specimen: Nasopharyngeal  Result Value Ref Range Status   MRSA by PCR POSITIVE (A) NEGATIVE Final    Comment:        The GeneXpert MRSA Assay (FDA approved for NASAL specimens only), is one component of a comprehensive MRSA colonization surveillance program. It is not intended to diagnose MRSA infection nor to guide or monitor treatment for MRSA infections. RESULT CALLED TO, READ BACK BY AND VERIFIED WITH: NATALE,G RN AT 2105 03/10/2020 MITCHELL,L Performed at [facility], [place] [address], [place], [place] [address]   Culture, blood (routine x 2)     Status: None   Collection Time: 03/10/20  6:52 PM   Specimen: BLOOD LEFT HAND  Result Value Ref Range Status   Specimen Description BLOOD LEFT HAND  Final   Special Requests   Final    BOTTLES DRAWN AEROBIC AND ANAEROBIC Blood Culture results may not be optimal due to an inadequate volume of blood received in culture bottles   Culture   Final    NO GROWTH 5 DAYS Performed at [facility], [place] [address], [place], [place] [address]    Report Status 03/15/2020 FINAL  Final  Culture, blood (routine x 2)     Status: None   Collection Time: 03/10/20  7:05 PM   Specimen: BLOOD  Result Value Ref Range Status   Specimen Description BLOOD RIGHT THUMB  Final   Special Requests   Final    BOTTLES DRAWN AEROBIC ONLY Blood Culture results may not be optimal due to an inadequate volume of blood received in culture bottles   Culture   Final    NO GROWTH 5 DAYS Performed at [facility], [place] [address], [place], [place] [address]    Report Status 03/15/2020 FINAL  Final    Today   Subjective    [name] today has no headache,no chest abdominal pain,no new weakness tingling or numbness, feels much better wants to go home today.    Objective   Blood pressure (!) 172/91, pulse 80, temperature 98 F (36.7 C), temperature source Oral, resp. rate 19, height 5'  10" (1.778 m), weight 101.4 kg, SpO2 98 %.   Intake/Output Summary (Last 24 hours) at 03/17/2020 1007 Last data filed at 03/17/2020 0600 Gross per 24 hour  Intake 750.4 ml  Output 2250 ml  Net -1499.6 ml    Exam  Awake Alert, No new F.N deficits, chronic underlying functional paraplegia, normal  affect [place].AT,PERRAL Supple Neck,No JVD, No cervical lymphadenopathy appriciated.  Symmetrical Chest wall movement, Good air movement bilaterally, CTAB RRR,No Gallops,Rubs or new Murmurs, No Parasternal Heave +ve B.Sounds, Abd Soft, Non tender, No organomegaly appriciated, No rebound -guarding or rigidity. No Cyanosis, Clubbing or edema, No new Rash or bruise   Data Review   CBC w Diff:  Lab Results  Component Value Date   WBC 4.3 03/16/2020   HGB 12.3 (L) 03/16/2020   HCT 38.6 (L) 03/16/2020   HCT 32.7 (L) 03/14/2020   PLT 98 (L) 03/16/2020   LYMPHOPCT 4 03/10/2020   MONOPCT 1 03/10/2020   EOSPCT 0 03/10/2020   BASOPCT 0 03/10/2020    CMP:  Lab Results  Component Value Date   NA 141 03/16/2020   K 3.2 (L) 03/16/2020   CL 102 03/16/2020   CO2 27 03/16/2020   BUN 9 03/16/2020   CREATININE 1.19 03/17/2020   CREATININE 2.21 (H) 08/13/2015   PROT 6.3 (L) 03/10/2020   PROT 6.7 02/06/2016   ALBUMIN 3.1 (L) 03/10/2020   BILITOT 1.0 03/10/2020   ALKPHOS 82 03/10/2020   AST 31 03/10/2020   ALT 19 03/10/2020  .   Total Time in preparing paper work, data evaluation and todays exam - 55 minutes  Lala Lund M.D on 03/17/2020 at 10:07 AM  [facility]   Office  [phone]

## 2020-03-17 NOTE — TOC Transition Note (Signed)
Transition of Care [facility]) - CM/SW Discharge Note   Patient Details  Name: [name] MRN: [id number] Date of Birth: 08-24-1950  [facility]) CM/SW Contact:  Gabrielle Dare Phone Number: 03/17/2020, 11:45 AM   Clinical Narrative:    Patient will Discharge To: Home Anticipated DC Date:03/17/20 Family Notified:yes, spouse Sandre Kitty [phone] Transport By: Claudia Desanctis   Per MD patient ready for DC to  Home. RN, patient, patient's family, notified of DC. Assessment,  Discharge Summary sent to facility. [facility]. [facility] will transport pt within the next hour to home.    CSW signing off.  Reed Breech LCSWA [phone]     Final next level of care: Home/Self Care Barriers to Discharge: No Barriers Identified   Patient Goals and CMS Choice Patient states their goals for this hospitalization and ongoing recovery are:: Plans to return home with home health services through [place] is aware that patient may need Home O2 CMS Medicare.gov Compare Post Acute Care list provided to:: Other (Comment Required) Choice offered to / list presented to : Patient  Discharge Placement                Patient to be transferred to facility by:  (lGoing home by [facility]) Name of family member notified: Goro Wenrick Patient and family notified of of transfer: 03/17/20  Discharge Plan and Services In-house Referral:  ([facility] patient, all DME and HH via [facility]) Discharge Planning Services: CM Consult Post Acute Care Choice: Durable Medical Equipment [employer] and spoke with Krystal Eaton - CM with [facility] - aware pt will most likely need Home O2)          DME Arranged: Oxygen ([facility] will provide Home O2 if needed - made aware) DME Agency:  ([facility] to set up O2 if needed)       HH Arranged:  ([facility])          Social Determinants of Health (SDOH) Interventions     Readmission Risk Interventions Readmission Risk Prevention Plan 03/13/2020   Transportation Screening Complete  PCP or Specialist Appt within 3-5 Days Complete  HRI or Home Care Consult Complete  Social Work Consult for [place] Planning/Counseling Complete  Palliative Care Screening Not Applicable  Medication Review [employer]) Referral to Pharmacy  Some recent data might be hidden

## 2020-03-17 NOTE — Progress Notes (Signed)
Discharge summary packet provided to pt with instructions. Pt verbalized understanding of instrucitons. All questions and concerns were answererd.No complaints voiced. [facility] staff nurse has been informed of pt's meds. And made aware that pt has the AVS summary and ready to be picket up.

## 2020-03-17 NOTE — Discharge Instructions (Signed)
Follow with Primary MD Nicoletta Dress, MD in 7 days   Get CBC, CMP, 2 view Chest X ray -  checked next visit within 1 week by Primary MD   Activity: As tolerated with Full fall precautions use walker/cane & assistance as needed  Disposition Home   Diet: Heart Healthy Low Carb    Special Instructions: If you have smoked or chewed Tobacco  in the last 2 yrs please stop smoking, stop any regular Alcohol  and or any Recreational drug use.  On your next visit with your primary care physician please Get Medicines reviewed and adjusted.  Please request your Prim.MD to go over all Hospital Tests and Procedure/Radiological results at the follow up, please get all Hospital records sent to your Prim MD by signing hospital release before you go home.  If you experience worsening of your admission symptoms, develop shortness of breath, life threatening emergency, suicidal or homicidal thoughts you must seek medical attention immediately by calling 911 or calling your MD immediately  if symptoms less severe.  You Must read complete instructions/literature along with all the possible adverse reactions/side effects for all the Medicines you take and that have been prescribed to you. Take any new Medicines after you have completely understood and accpet all the possible adverse reactions/side effects.          Person Under Monitoring Name: [name]  Location: [address] [place] [address]   Infection Prevention Recommendations for Individuals Confirmed to have, or Being Evaluated for, 2019 Novel Coronavirus (COVID-19) Infection Who Receive Care at Home  Individuals who are confirmed to have, or are being evaluated for, COVID-19 should follow the prevention steps below until a healthcare provider or local or state health department says they can return to normal activities.  Stay home except to get medical care You should restrict activities outside your home, except for getting  medical care. Do not go to work, school, or public areas, and do not use public transportation or taxis.  Call ahead before visiting your doctor Before your medical appointment, call the healthcare provider and tell them that you have, or are being evaluated for, COVID-19 infection. This will help the healthcare provider's office take steps to keep other people from getting infected. Ask your healthcare provider to call the local or state health department.  Monitor your symptoms Seek prompt medical attention if your illness is worsening (e.g., difficulty breathing). Before going to your medical appointment, call the healthcare provider and tell them that you have, or are being evaluated for, COVID-19 infection. Ask your healthcare provider to call the local or state health department.  Wear a facemask You should wear a facemask that covers your nose and mouth when you are in the same room with other people and when you visit a healthcare provider. People who live with or visit you should also wear a facemask while they are in the same room with you.  Separate yourself from other people in your home As much as possible, you should stay in a different room from other people in your home. Also, you should use a separate bathroom, if available.  Avoid sharing household items You should not share dishes, drinking glasses, cups, eating utensils, towels, bedding, or other items with other people in your home. After using these items, you should wash them thoroughly with soap and water.  Cover your coughs and sneezes Cover your mouth and nose with a tissue when you cough or sneeze, or  you can cough or sneeze into your sleeve. Throw used tissues in a lined trash can, and immediately wash your hands with soap and water for at least 20 seconds or use an alcohol-based hand rub.  Wash your [facility] your hands often and thoroughly with soap and water for at least 20 seconds. You can use an  alcohol-based hand sanitizer if soap and water are not available and if your hands are not visibly dirty. Avoid touching your eyes, nose, and mouth with unwashed hands.   Prevention Steps for Caregivers and Household Members of Individuals Confirmed to have, or Being Evaluated for, COVID-19 Infection Being Cared for in the Home  If you live with, or provide care at home for, a person confirmed to have, or being evaluated for, COVID-19 infection please follow these guidelines to prevent infection:  Follow healthcare provider's instructions Make sure that you understand and can help the patient follow any healthcare provider instructions for all care.  Provide for the patient's basic needs You should help the patient with basic needs in the home and provide support for getting groceries, prescriptions, and other personal needs.  Monitor the patient's symptoms If they are getting sicker, call his or her medical provider and tell them that the patient has, or is being evaluated for, COVID-19 infection. This will help the healthcare provider's office take steps to keep other people from getting infected. Ask the healthcare provider to call the local or state health department.  Limit the number of people who have contact with the patient  If possible, have only one caregiver for the patient.  Other household members should stay in another home or place of residence. If this is not possible, they should stay  in another room, or be separated from the patient as much as possible. Use a separate bathroom, if available.  Restrict visitors who do not have an essential need to be in the home.  Keep older adults, very young children, and other sick people away from the patient Keep older adults, very young children, and those who have compromised immune systems or chronic health conditions away from the patient. This includes people with chronic heart, lung, or kidney conditions, diabetes, and  cancer.  Ensure good ventilation Make sure that shared spaces in the home have good air flow, such as from an air conditioner or an opened window, weather permitting.  Wash your hands often  Wash your hands often and thoroughly with soap and water for at least 20 seconds. You can use an alcohol based hand sanitizer if soap and water are not available and if your hands are not visibly dirty.  Avoid touching your eyes, nose, and mouth with unwashed hands.  Use disposable paper towels to dry your hands. If not available, use dedicated cloth towels and replace them when they become wet.  Wear a facemask and gloves  Wear a disposable facemask at all times in the room and gloves when you touch or have contact with the patient's blood, body fluids, and/or secretions or excretions, such as sweat, saliva, sputum, nasal mucus, vomit, urine, or feces.  Ensure the mask fits over your nose and mouth tightly, and do not touch it during use.  Throw out disposable facemasks and gloves after using them. Do not reuse.  Wash your hands immediately after removing your facemask and gloves.  If your personal clothing becomes contaminated, carefully remove clothing and launder. Wash your hands after handling contaminated clothing.  Place all used disposable facemasks,  gloves, and other waste in a lined container before disposing them with other household waste.  Remove gloves and wash your hands immediately after handling these items.  Do not share dishes, glasses, or other household items with the patient  Avoid sharing household items. You should not share dishes, drinking glasses, cups, eating utensils, towels, bedding, or other items with a patient who is confirmed to have, or being evaluated for, COVID-19 infection.  After the person uses these items, you should wash them thoroughly with soap and water.  Wash laundry thoroughly  Immediately remove and wash clothes or bedding that have blood, body  fluids, and/or secretions or excretions, such as sweat, saliva, sputum, nasal mucus, vomit, urine, or feces, on them.  Wear gloves when handling laundry from the patient.  Read and follow directions on labels of laundry or clothing items and detergent. In general, wash and dry with the warmest temperatures recommended on the label.  Clean all areas the individual has used often  Clean all touchable surfaces, such as counters, tabletops, doorknobs, bathroom fixtures, toilets, phones, keyboards, tablets, and bedside tables, every day. Also, clean any surfaces that may have blood, body fluids, and/or secretions or excretions on them.  Wear gloves when cleaning surfaces the patient has come in contact with.  Use a diluted bleach solution (e.g., dilute bleach with 1 part bleach and 10 parts water) or a household disinfectant with a label that says EPA-registered for coronaviruses. To make a bleach solution at home, add 1 tablespoon of bleach to 1 quart (4 cups) of water. For a larger supply, add  cup of bleach to 1 gallon (16 cups) of water.  Read labels of cleaning products and follow recommendations provided on product labels. Labels contain instructions for safe and effective use of the cleaning product including precautions you should take when applying the product, such as wearing gloves or eye protection and making sure you have good ventilation during use of the product.  Remove gloves and wash hands immediately after cleaning.  Monitor yourself for signs and symptoms of illness Caregivers and household members are considered close contacts, should monitor their health, and will be asked to limit movement outside of the home to the extent possible. Follow the monitoring steps for close contacts listed on the symptom monitoring form.   ? If you have additional questions, contact your local health department or call the epidemiologist on call at [phone] (available 24/7). ? This  guidance is subject to change. For the most up-to-date guidance from [facility], please refer to their website: YouBlogs.pl   Ureteral Stent Implantation, Care After This sheet gives you information about how to care for yourself after your procedure. Your health care provider may also give you more specific instructions. If you have problems or questions, contact your health care provider.  Be sure to follow-up with Dr. Junious Silk or your urologist to ensure the stent/stone is removed in the coming weeks.  What can I expect after the procedure? After the procedure, it is common to have:  Nausea.  Mild pain when you urinate. You may feel this pain in your lower back or lower abdomen. The pain should stop within a few minutes after you urinate. This may last for up to 1 week.  A small amount of blood in your urine for several days. Follow these instructions at home: Medicines  Take over-the-counter and prescription medicines only as told by your health care provider.  If you were prescribed an antibiotic medicine, take it  as told by your health care provider. Do not stop taking the antibiotic even if you start to feel better.  Do not drive for 24 hours if you were given a sedative during your procedure.  Ask your health care provider if the medicine prescribed to you requires you to avoid driving or using heavy machinery. Activity  Rest as told by your health care provider.  Avoid sitting for a long time without moving. Get up to take short walks every 1-2 hours. This is important to improve blood flow and breathing. Ask for help if you feel weak or unsteady.  Return to your normal activities as told by your health care provider. Ask your health care provider what activities are safe for you. General instructions   Watch for any blood in your urine. Call your health care provider if the amount of blood in your urine  increases.  If you have a catheter: ? Follow instructions from your health care provider about taking care of your catheter and collection bag. ? Do not take baths, swim, or use a hot tub until your health care provider approves. Ask your health care provider if you may take showers. You may only be allowed to take sponge baths.  Drink enough fluid to keep your urine pale yellow.  Do not use any products that contain nicotine or tobacco, such as cigarettes, e-cigarettes, and chewing tobacco. These can delay healing after surgery. If you need help quitting, ask your health care provider.  Keep all follow-up visits as told by your health care provider. This is important. Contact a health care provider if:  You have pain that gets worse or does not get better with medicine, especially pain when you urinate.  You have difficulty urinating.  You feel nauseous or you vomit repeatedly during a period of more than 2 days after the procedure. Get help right away if:  Your urine is dark red or has blood clots in it.  You are leaking urine (have incontinence).  The end of the stent comes out of your urethra.  You cannot urinate.  You have sudden, sharp, or severe pain in your abdomen or lower back.  You have a fever.  You have swelling or pain in your legs.  You have difficulty breathing. Summary  After the procedure, it is common to have mild pain when you urinate that goes away within a few minutes after you urinate. This may last for up to 1 week.  Watch for any blood in your urine. Call your health care provider if the amount of blood in your urine increases.  Take over-the-counter and prescription medicines only as told by your health care provider.  Drink enough fluid to keep your urine pale yellow. This information is not intended to replace advice given to you by your health care provider. Make sure you discuss any questions you have with your health care provider. Document  Revised: 03/30/2018 Document Reviewed: 03/31/2018 Elsevier Patient Education  2020 [employer].

## 2020-03-28 ENCOUNTER — Other Ambulatory Visit: Payer: Self-pay

## 2020-03-28 ENCOUNTER — Encounter (HOSPITAL_COMMUNITY): Payer: Self-pay | Admitting: Urology

## 2020-03-28 NOTE — Patient Instructions (Addendum)
Preop instructions for:  [name]. [name]    Date of Birth:  01/29/1951                     Date of Procedure:   04/03/2020 Procedure:     LEFT CYSTOSCOPY/URETEROSCOPY/HOLMIUM LASER/STENT EXCHANGE Surgeon: Dr. Festus Aloe Facility contact:  [place]   Phone: [place]: N/A Nurse practitioner contact name/phone#:    Isla Pence, NP                      and Fax #: [phone]   Transportation contact phone#: [place]   Time to arrive at [facility]:  5:30 AM   Report to: Admitting (On your left hand side)    Do not eat or drink past midnight the night before your procedure.(To include any tube feedings-must be discontinued)   Take these morning medications only with sips of water.(or give through gastrostomy or feeding tube). Mycophenolate, Omeprazole, Prednisone, Sertraline, Tacrolimus, Pregablin  USE INHALER THE MORNING OF SURGERY AND BRING ALL INHALERS DAY OF SURGERY   The Day Before Surgery: DO NOT TAKE BEDTIME DOSE OF INSULIN LISPRO   Note: No Insulin or Diabetic meds should be given or taken the morning of the procedure! If sliding insulin is needed only take 1/2 the usual dose of insulin  BRING CPAP MASK AND TUBING DAY OF SURGERY  Please send day of procedure:current med list and meds last taken that day, confirm nothing by mouth status from what time, Patient Demographic info( to include DNR status, problem list, allergies)   Bring Insurance card and picture ID Leave all jewelry and other valuables at place where living( no metal or rings to be worn) No contact lens Men-no colognes,lotions   Any questions day of procedure,call  SHORT STAY-[phone]     Sent from :[facility]                   Phone:[phone]                   [phone]   Sent by :    Harlon Flor BSN           RN

## 2020-03-28 NOTE — Progress Notes (Addendum)
COVID Vaccine Completed: Yes Date COVID Vaccine completed: 12/05/19, 01/02/20 COVID vaccine manufacturer: [place]     PCP - Dr. Nelda Bucks Progress note from 03/20/2020 Isla Pence NP in chart Cardiologist - [facility]   Chest x-ray - 03/23/20 in hard chart EKG - 03/20/20 in hard chart Stress Test -  ECHO - greater than 2 years Cardiac Cath - N/A Pacemaker/ICD device last checked:N/A  Sleep Study - Yes CPAP - Yes  Fasting Blood Sugar - 100-300 Checks Blood Sugar __2___ times a day HgbA1c 6.39/1/21 results in hard chart  Blood Thinner Instructions:N/A Aspirin Instructions: Has received no instructions about Aspirin Last Dose:  Anesthesia review: History of kidney/liver transplant, DM, OSA, Stroke  Patient denies shortness of breath, fever, cough and chest pain at PAT appointment   Patient verbalized understanding of instructions that were given to them at the PAT appointment. Patient was also instructed that they will need to review over the PAT instructions again at home before surgery.

## 2020-04-02 ENCOUNTER — Encounter (HOSPITAL_COMMUNITY): Payer: Self-pay | Admitting: Urology

## 2020-04-02 NOTE — Anesthesia Preprocedure Evaluation (Addendum)
Anesthesia Evaluation  Patient identified by MRN, date of birth, ID band Patient awake    Reviewed: Allergy & Precautions, NPO status , Patient's Chart, lab work & pertinent test results  Airway Mallampati: III  TM Distance: >3 FB Neck ROM: Full    Dental  (+) Teeth Intact, Dental Advisory Given   Pulmonary sleep apnea ,    Pulmonary exam normal        Cardiovascular  Rhythm:Regular Rate:Normal     Neuro/Psych PSYCHIATRIC DISORDERS Depression CVA    GI/Hepatic GERD  Medicated,(+) Hepatitis -  Endo/Other  diabetes  Renal/GU Renal disease     Musculoskeletal  (+) Arthritis ,   Abdominal Normal abdominal exam  (+)   Peds  Hematology   Anesthesia Other Findings   Reproductive/Obstetrics                            Anesthesia Physical Anesthesia Plan  ASA: III  Anesthesia Plan: General   Post-op Pain Management:    Induction: Intravenous, Rapid sequence and Cricoid pressure planned  PONV Risk Score and Plan: 3 and Ondansetron, Dexamethasone and Midazolam  Airway Management Planned: Oral ETT  Additional Equipment: None  Intra-op Plan:   Post-operative Plan: Extubation in OR  Informed Consent: I have reviewed the patients History and Physical, chart, labs and discussed the procedure including the risks, benefits and alternatives for the proposed anesthesia with the patient or authorized representative who has indicated his/her understanding and acceptance.     Dental advisory given  Plan Discussed with: CRNA  Anesthesia Plan Comments:        Anesthesia Quick Evaluation

## 2020-04-02 NOTE — Progress Notes (Signed)
Anesthesia Chart Review   Case: [id number] Date/Time: 04/03/20 0700   Procedure: CYSTOSCOPY/URETEROSCOPY/HOLMIUM LASER/STENT EXCHANGE (Left )   Anesthesia type: General   Pre-op diagnosis: LEFT URETERAL STONE   Location: [employer] / [facility]   Surgeons: Festus Aloe, MD      DISCUSSION:69 y.o. never smoker with h/o DM II, GERD, OSA, s/p liver and renal transplant (on CellCept, tacrolimus, and chronic steroids), h/o NASH, thrombocytopenia at baseline, left ureteral stone scheduled for above procedure 04/03/2020 with Dr. Festus Aloe.   Pt with severe deconditioning/wheelchair dependent.  Per discharge notes 03/17/2020, "Patient with extensive neurosurgical history at [facility] "had undergone suboccipital and cervical decompression versus fenestration by Dr. Dava Najjar postoperative course complicated by concern for hydrocephalus and posterior fossa stroke which he went additional posterior fossa decompression and EVD placement""  Recent admission 9/4-9/05/2020 due to septic shock secondary to UTI, obstructive nephropathy/renal stone with hydronephrosis.  S/p ureteral stent 03/11/20, no anesthesia complications noted.  Found to be COVID positive during admission; incidental finding, pt asymptomatic.  Stable at discharge.    Pt same day workup.  Anticipate pt can proceed with planned procedure barring acute status change and after evaluation DOS.  VS: Ht 5\' 10"  (1.778 m)   Wt 95.3 kg   BMI 30.13 kg/m   PROVIDERS: Nicoletta Dress, MD is PCP    LABS: Labs reviewed: Acceptable for surgery. (all labs ordered are listed, but only abnormal results are displayed)  Labs Reviewed - No data to display   IMAGES:   EKG: 03/16/2020 Rate 53 bpm  Sinus bradycardia with marked sinus arrhythmia with 1st degree AV block  Low voltage QRS Since previous tracing NSR has replaced junctional tachycardia   CV: Echo 10/21/2014 Study Conclusions   - Left ventricle: The cavity size was normal.  Wall thickness was  normal. Systolic function was vigorous. The estimated ejection  fraction was in the range of 65% to 70%. Wall motion was normal;  there were no regional wall motion abnormalities. Left  ventricular diastolic function parameters were normal for the  patient&'s age.  - Aortic valve: Trileaflet; mildly calcified leaflets. There was  trivial regurgitation.  - Mitral valve: There was trivial regurgitation.  - Left atrium: The atrium was mildly to moderately dilated.  - Atrial septum: No defect or patent foramen ovale was identified.  - Tricuspid valve: There was trivial regurgitation. Peak RV-RA  gradient (S): 22 mm Hg.  - Pericardium, extracardiac: There was no pericardial effusion.  Past Medical History:  Diagnosis Date  . Abscess in epidural space of cervical spine 08/13/2015  . Acute hepatic encephalopathy   . Acute renal failure superimposed on stage 3 chronic kidney disease ([place])   . Anemia of chronic disease   . Ascites   . Bilateral carpal tunnel syndrome 01/27/2018  . Bleeding esophageal varices ([place])   . Cataract   . Cervical disc disorder   . Coagulopathy ([place])   . Depression   . Diabetes mellitus type II, controlled ([place])   . Diabetic neuropathy ([place])   . Dysphagia   . Esophageal stricture   . First degree AV block   . GERD (gastroesophageal reflux disease)   . Gout   . History of blood transfusion   . History of COVID-19 03/10/2020  . History of kidney stones   . Insomnia   . NASH (nonalcoholic steatohepatitis)   . NASH (nonalcoholic steatohepatitis)   . OSA (obstructive sleep apnea)    PRIOR USE OF CPAP  .  Peripheral neuropathy   . Protein-calorie malnutrition, severe ([place])   . Secondary biliary cirrhosis ([place])   . Stroke ([place])   . Thrombocytopenia ([place])   . Vitamin D deficiency   . Wears glasses     Past Surgical History:  Procedure Laterality Date  . COLONOSCOPY    . CYSTOSCOPY WITH STENT PLACEMENT Left 03/10/2020    Procedure: CYSTOSCOPY WITH RETROGRADE AND LEFT STENT PLACEMENT;  Surgeon: Festus Aloe, MD;  Location: [place];  Service: Urology;  Laterality: Left;  . ESOPHAGEAL BANDING Bilateral 11/29/2013   Procedure: ESOPHAGEAL BANDING;  Surgeon: Irene Shipper, MD;  Location: [facility];  Service: Endoscopy;  Laterality: Bilateral;  . ESOPHAGEAL BANDING N/A 06/13/2014   Procedure: ESOPHAGEAL BANDING;  Surgeon: Irene Shipper, MD;  Location: [facility];  Service: Endoscopy;  Laterality: N/A;  . ESOPHAGOGASTRODUODENOSCOPY  08/01/2011   Procedure: ESOPHAGOGASTRODUODENOSCOPY (EGD);  Surgeon: Scarlette Shorts, MD;  [facility];  Service: Endoscopy;  Laterality: N/A;  . ESOPHAGOGASTRODUODENOSCOPY N/A 10/27/2012   Procedure: ESOPHAGOGASTRODUODENOSCOPY (EGD);  Surgeon: Inda Castle, MD;  [facility];  Service: Endoscopy;  Laterality: N/A;  . ESOPHAGOGASTRODUODENOSCOPY N/A 11/22/2012   Procedure: ESOPHAGOGASTRODUODENOSCOPY (EGD);  Surgeon: Irene Shipper, MD;  [facility];  Service: Endoscopy;  Laterality: N/A;  . ESOPHAGOGASTRODUODENOSCOPY N/A 11/29/2013   Procedure: ESOPHAGOGASTRODUODENOSCOPY (EGD);  Surgeon: Irene Shipper, MD;  [facility];  Service: Endoscopy;  Laterality: N/A;  . ESOPHAGOGASTRODUODENOSCOPY N/A 06/13/2014   Procedure: ESOPHAGOGASTRODUODENOSCOPY (EGD);  Surgeon: Irene Shipper, MD;  Location[facility];  Service: Endoscopy;  Laterality: N/A;  . KIDNEY STONE SURGERY  yrs ago  . KIDNEY TRANSPLANT    . 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 UPPER GASTROINTESTINAL ENDOSCOPY      MEDICATIONS: . 0.9 %  sodium chloride infusion   . acetaminophen (TYLENOL) 325 MG tablet  . albuterol (VENTOLIN HFA) 108 (90 Base) MCG/ACT inhaler  . aspirin EC 81 MG tablet  . [employer] MUCUS EXTENDED RELEASE 600 MG 12 hr tablet  . ferrous sulfate 325 (65 FE) MG tablet  . HUMALOG KWIKPEN 100 UNIT/ML KiwkPen  . mycophenolate (CELLCEPT) 250 MG capsule  . omeprazole (PRILOSEC) 20 MG capsule  .  predniSONE (DELTASONE) 5 MG tablet  . pregabalin (LYRICA) 50 MG capsule  . Probiotic Product (ALIGN) 4 MG CAPS  . sertraline (ZOLOFT) 50 MG tablet  . Tacrolimus (ENVARSUS XR) 1 MG TB24  . traZODone (DESYREL) 50 MG tablet     Konrad Felix, PA-C [facility]-Surgical [facility] [phone]

## 2020-04-03 ENCOUNTER — Encounter (HOSPITAL_COMMUNITY)
Admission: RE | Disposition: A | Discharge status: Home / Self Care | Payer: Self-pay | Source: Home / Self Care | Attending: Urology

## 2020-04-03 ENCOUNTER — Ambulatory Visit (HOSPITAL_COMMUNITY): Payer: Medicare (Managed Care) | Admitting: Physician Assistant

## 2020-04-03 ENCOUNTER — Ambulatory Visit (HOSPITAL_COMMUNITY): Payer: Medicare (Managed Care)

## 2020-04-03 ENCOUNTER — Encounter (HOSPITAL_COMMUNITY): Payer: Self-pay | Admitting: Urology

## 2020-04-03 ENCOUNTER — Ambulatory Visit (HOSPITAL_COMMUNITY)
Admission: RE | Admit: 2020-04-03 | Discharge: 2020-04-03 | Disposition: A | Discharge status: Home / Self Care | Payer: Medicare (Managed Care) | Attending: Urology | Admitting: Urology

## 2020-04-03 DIAGNOSIS — Z940 Kidney transplant status: Secondary | ICD-10-CM | POA: Insufficient documentation

## 2020-04-03 DIAGNOSIS — K7581 Nonalcoholic steatohepatitis (NASH): Secondary | ICD-10-CM | POA: Insufficient documentation

## 2020-04-03 DIAGNOSIS — Z8673 Personal history of transient ischemic attack (TIA), and cerebral infarction without residual deficits: Secondary | ICD-10-CM | POA: Diagnosis not present

## 2020-04-03 DIAGNOSIS — Z6829 Body mass index (BMI) 29.0-29.9, adult: Secondary | ICD-10-CM | POA: Insufficient documentation

## 2020-04-03 DIAGNOSIS — D696 Thrombocytopenia, unspecified: Secondary | ICD-10-CM | POA: Diagnosis not present

## 2020-04-03 DIAGNOSIS — Z944 Liver transplant status: Secondary | ICD-10-CM | POA: Insufficient documentation

## 2020-04-03 DIAGNOSIS — Z79899 Other long term (current) drug therapy: Secondary | ICD-10-CM | POA: Insufficient documentation

## 2020-04-03 DIAGNOSIS — E1142 Type 2 diabetes mellitus with diabetic polyneuropathy: Secondary | ICD-10-CM | POA: Insufficient documentation

## 2020-04-03 DIAGNOSIS — I517 Cardiomegaly: Secondary | ICD-10-CM | POA: Diagnosis not present

## 2020-04-03 DIAGNOSIS — Z8616 Personal history of COVID-19: Secondary | ICD-10-CM | POA: Insufficient documentation

## 2020-04-03 DIAGNOSIS — E1136 Type 2 diabetes mellitus with diabetic cataract: Secondary | ICD-10-CM | POA: Insufficient documentation

## 2020-04-03 DIAGNOSIS — F329 Major depressive disorder, single episode, unspecified: Secondary | ICD-10-CM | POA: Insufficient documentation

## 2020-04-03 DIAGNOSIS — Z833 Family history of diabetes mellitus: Secondary | ICD-10-CM | POA: Insufficient documentation

## 2020-04-03 DIAGNOSIS — N183 Chronic kidney disease, stage 3 unspecified: Secondary | ICD-10-CM | POA: Diagnosis not present

## 2020-04-03 DIAGNOSIS — G4733 Obstructive sleep apnea (adult) (pediatric): Secondary | ICD-10-CM | POA: Insufficient documentation

## 2020-04-03 DIAGNOSIS — K219 Gastro-esophageal reflux disease without esophagitis: Secondary | ICD-10-CM | POA: Diagnosis not present

## 2020-04-03 DIAGNOSIS — Z7952 Long term (current) use of systemic steroids: Secondary | ICD-10-CM | POA: Insufficient documentation

## 2020-04-03 DIAGNOSIS — Z7982 Long term (current) use of aspirin: Secondary | ICD-10-CM | POA: Insufficient documentation

## 2020-04-03 DIAGNOSIS — D689 Coagulation defect, unspecified: Secondary | ICD-10-CM | POA: Insufficient documentation

## 2020-04-03 DIAGNOSIS — G4700 Insomnia, unspecified: Secondary | ICD-10-CM | POA: Insufficient documentation

## 2020-04-03 DIAGNOSIS — E1122 Type 2 diabetes mellitus with diabetic chronic kidney disease: Secondary | ICD-10-CM | POA: Diagnosis not present

## 2020-04-03 DIAGNOSIS — Z87442 Personal history of urinary calculi: Secondary | ICD-10-CM | POA: Insufficient documentation

## 2020-04-03 DIAGNOSIS — D631 Anemia in chronic kidney disease: Secondary | ICD-10-CM | POA: Insufficient documentation

## 2020-04-03 DIAGNOSIS — I129 Hypertensive chronic kidney disease with stage 1 through stage 4 chronic kidney disease, or unspecified chronic kidney disease: Secondary | ICD-10-CM | POA: Insufficient documentation

## 2020-04-03 DIAGNOSIS — N132 Hydronephrosis with renal and ureteral calculous obstruction: Secondary | ICD-10-CM | POA: Diagnosis not present

## 2020-04-03 DIAGNOSIS — Z8249 Family history of ischemic heart disease and other diseases of the circulatory system: Secondary | ICD-10-CM | POA: Insufficient documentation

## 2020-04-03 DIAGNOSIS — K5900 Constipation, unspecified: Secondary | ICD-10-CM | POA: Insufficient documentation

## 2020-04-03 HISTORY — DX: Vitamin D deficiency, unspecified: E55.9

## 2020-04-03 HISTORY — DX: Unspecified cataract: H26.9

## 2020-04-03 HISTORY — DX: Gout, unspecified: M10.9

## 2020-04-03 HISTORY — PX: CYSTOSCOPY/URETEROSCOPY/HOLMIUM LASER/STENT PLACEMENT: SHX6546

## 2020-04-03 HISTORY — DX: Obstructive sleep apnea (adult) (pediatric): G47.33

## 2020-04-03 HISTORY — DX: Cervical disc disorder, unspecified, unspecified cervical region: M50.90

## 2020-04-03 HISTORY — DX: Cerebral infarction, unspecified: I63.9

## 2020-04-03 HISTORY — DX: Dysphagia, unspecified: R13.10

## 2020-04-03 HISTORY — DX: Personal history of urinary calculi: Z87.442

## 2020-04-03 HISTORY — DX: Insomnia, unspecified: G47.00

## 2020-04-03 HISTORY — DX: Personal history of other medical treatment: Z92.89

## 2020-04-03 HISTORY — DX: Polyneuropathy, unspecified: G62.9

## 2020-04-03 HISTORY — DX: Presence of spectacles and contact lenses: Z97.3

## 2020-04-03 HISTORY — DX: Atrioventricular block, first degree: I44.0

## 2020-04-03 LAB — GLUCOSE, CAPILLARY
Glucose-Capillary: 247 mg/dL — ABNORMAL HIGH (ref 70–99)
Glucose-Capillary: 235 mg/dL — ABNORMAL HIGH (ref 70–99)

## 2020-04-03 SURGERY — CYSTOSCOPY/URETEROSCOPY/HOLMIUM LASER/STENT PLACEMENT
Anesthesia: General | Laterality: Left

## 2020-04-03 MED ORDER — ORAL CARE MOUTH RINSE: 15.0000 mL | Freq: Once | OROMUCOSAL | Status: AC

## 2020-04-03 MED ORDER — ACETAMINOPHEN 10 MG/ML IV SOLN: 1000.0000 mg | Freq: Once | INTRAVENOUS | Status: DC | PRN

## 2020-04-03 MED ORDER — DEXAMETHASONE SODIUM PHOSPHATE 10 MG/ML IJ SOLN
INTRAMUSCULAR | Status: AC
  Filled 2020-04-03: qty 1

## 2020-04-03 MED ORDER — EPHEDRINE 5 MG/ML INJ
INTRAVENOUS | Status: AC
  Filled 2020-04-03: qty 10

## 2020-04-03 MED ORDER — INSULIN ASPART 100 UNIT/ML ~~LOC~~ SOLN
5.0000 [IU] | Freq: Once | SUBCUTANEOUS | Status: AC
  Administered 2020-04-03: 5 [IU] via INTRAVENOUS
  Filled 2020-04-03: qty 1

## 2020-04-03 MED ORDER — FENTANYL CITRATE (PF) 250 MCG/5ML IJ SOLN
INTRAMUSCULAR | Status: DC | PRN
Start: 2020-04-03 — End: 2020-04-03
  Administered 2020-04-03: 100 ug via INTRAVENOUS

## 2020-04-03 MED ORDER — PHENYLEPHRINE 40 MCG/ML (10ML) SYRINGE FOR IV PUSH (FOR BLOOD PRESSURE SUPPORT)
PREFILLED_SYRINGE | INTRAVENOUS | Status: DC | PRN
  Administered 2020-04-03 (×6): 40 ug via INTRAVENOUS

## 2020-04-03 MED ORDER — PHENYLEPHRINE 40 MCG/ML (10ML) SYRINGE FOR IV PUSH (FOR BLOOD PRESSURE SUPPORT)
PREFILLED_SYRINGE | INTRAVENOUS | Status: AC
  Filled 2020-04-03: qty 10

## 2020-04-03 MED ORDER — SUCCINYLCHOLINE CHLORIDE 200 MG/10ML IV SOSY
PREFILLED_SYRINGE | INTRAVENOUS | Status: AC
  Filled 2020-04-03: qty 10

## 2020-04-03 MED ORDER — SODIUM CHLORIDE 0.9 % IR SOLN
Status: DC | PRN
  Administered 2020-04-03: 6000 mL

## 2020-04-03 MED ORDER — ONDANSETRON HCL 4 MG/2ML IJ SOLN
INTRAMUSCULAR | Status: DC | PRN
  Administered 2020-04-03: 4 mg via INTRAVENOUS

## 2020-04-03 MED ORDER — EPHEDRINE SULFATE-NACL 50-0.9 MG/10ML-% IV SOSY
PREFILLED_SYRINGE | INTRAVENOUS | Status: DC | PRN
  Administered 2020-04-03 (×4): 5 mg via INTRAVENOUS

## 2020-04-03 MED ORDER — MIDAZOLAM HCL 2 MG/2ML IJ SOLN
INTRAMUSCULAR | Status: AC
  Filled 2020-04-03: qty 2

## 2020-04-03 MED ORDER — LIDOCAINE 2% (20 MG/ML) 5 ML SYRINGE
INTRAMUSCULAR | Status: DC | PRN
  Administered 2020-04-03: 40 mg via INTRAVENOUS

## 2020-04-03 MED ORDER — ACETAMINOPHEN 325 MG PO TABS: 325.0000 mg | ORAL_TABLET | Freq: Once | ORAL | Status: DC | PRN

## 2020-04-03 MED ORDER — ACETAMINOPHEN 160 MG/5ML PO SOLN: 325.0000 mg | Freq: Once | ORAL | Status: DC | PRN

## 2020-04-03 MED ORDER — DEXAMETHASONE SODIUM PHOSPHATE 10 MG/ML IJ SOLN
INTRAMUSCULAR | Status: DC | PRN
  Administered 2020-04-03: 4 mg via INTRAVENOUS

## 2020-04-03 MED ORDER — AMISULPRIDE (ANTIEMETIC) 5 MG/2ML IV SOLN: 10.0000 mg | Freq: Once | INTRAVENOUS | Status: DC | PRN

## 2020-04-03 MED ORDER — MEPERIDINE HCL 50 MG/ML IJ SOLN: 6.2500 mg | INTRAMUSCULAR | Status: DC | PRN

## 2020-04-03 MED ORDER — CHLORHEXIDINE GLUCONATE 0.12 % MT SOLN
15.0000 mL | Freq: Once | OROMUCOSAL | Status: AC
  Administered 2020-04-03: 15 mL via OROMUCOSAL

## 2020-04-03 MED ORDER — PROPOFOL 10 MG/ML IV BOLUS
INTRAVENOUS | Status: DC | PRN
  Administered 2020-04-03: 150 mg via INTRAVENOUS

## 2020-04-03 MED ORDER — FENTANYL CITRATE (PF) 100 MCG/2ML IJ SOLN
INTRAMUSCULAR | Status: AC
  Filled 2020-04-03: qty 2

## 2020-04-03 MED ORDER — SUCCINYLCHOLINE CHLORIDE 200 MG/10ML IV SOSY
PREFILLED_SYRINGE | INTRAVENOUS | Status: DC | PRN
  Administered 2020-04-03: 140 mg via INTRAVENOUS

## 2020-04-03 MED ORDER — ONDANSETRON HCL 4 MG/2ML IJ SOLN
INTRAMUSCULAR | Status: AC
  Filled 2020-04-03: qty 2

## 2020-04-03 MED ORDER — CEPHALEXIN 500 MG PO CAPS: 500.0000 mg | ORAL_CAPSULE | Freq: Every day | ORAL | 0 refills | Status: AC

## 2020-04-03 MED ORDER — LACTATED RINGERS IV SOLN: INTRAVENOUS | Status: DC

## 2020-04-03 MED ORDER — CEFAZOLIN SODIUM-DEXTROSE 2-4 GM/100ML-% IV SOLN
2.0000 g | Freq: Once | INTRAVENOUS | Status: AC
  Administered 2020-04-03: 2 g via INTRAVENOUS
  Filled 2020-04-03: qty 100

## 2020-04-03 MED ORDER — FENTANYL CITRATE (PF) 100 MCG/2ML IJ SOLN: 25.0000 ug | INTRAMUSCULAR | Status: DC | PRN

## 2020-04-03 MED ORDER — LIDOCAINE 2% (20 MG/ML) 5 ML SYRINGE
INTRAMUSCULAR | Status: AC
  Filled 2020-04-03: qty 5

## 2020-04-03 MED ORDER — PROPOFOL 10 MG/ML IV BOLUS
INTRAVENOUS | Status: AC
  Filled 2020-04-03: qty 40

## 2020-04-03 SURGICAL SUPPLY — 24 items
BAG URO CATCHER STRL LF (MISCELLANEOUS) ×3 IMPLANT
BASKET ZERO TIP NITINOL 2.4FR (BASKET) IMPLANT
BSKT STON RTRVL ZERO TP 2.4FR (BASKET)
CATH INTERMIT  6FR 70CM (CATHETERS) ×3 IMPLANT
CATH URET 5FR 28IN CONE TIP (BALLOONS)
CATH URET 5FR 70CM CONE TIP (BALLOONS) IMPLANT
CLOTH BEACON ORANGE TIMEOUT ST (SAFETY) ×3 IMPLANT
FIBER LASER MOSES 200 DFL (Laser) ×2 IMPLANT
GLOVE BIO SURGEON STRL SZ7.5 (GLOVE) ×3 IMPLANT
GOWN STRL REUS W/TWL XL LVL3 (GOWN DISPOSABLE) ×3 IMPLANT
GUIDEWIRE STR DUAL SENSOR (WIRE) ×3 IMPLANT
GUIDEWIRE ZIPWRE .038 STRAIGHT (WIRE) ×2 IMPLANT
KIT TURNOVER KIT A (KITS) IMPLANT
LASER FIB FLEXIVA PULSE ID 365 (Laser) IMPLANT
MANIFOLD NEPTUNE II (INSTRUMENTS) ×3 IMPLANT
PACK CYSTO (CUSTOM PROCEDURE TRAY) ×3 IMPLANT
SHEATH URETERAL 12FRX28CM (UROLOGICAL SUPPLIES) IMPLANT
SHEATH URETERAL 12FRX35CM (MISCELLANEOUS) ×3 IMPLANT
STENT URET 6FRX26 CONTOUR (STENTS) ×3 IMPLANT
TRACTIP FLEXIVA PULS ID 200XHI (Laser) IMPLANT
TRACTIP FLEXIVA PULSE ID 200 (Laser)
TUBING CONNECTING 10 (TUBING) ×2 IMPLANT
TUBING CONNECTING 10' (TUBING) ×1
TUBING UROLOGY SET (TUBING) ×3 IMPLANT

## 2020-04-03 NOTE — Discharge Instructions (Signed)
Ureteral Stent Implantation, Care After This sheet gives you information about how to care for yourself after your procedure. Your health care provider may also give you more specific instructions. If you have problems or questions, contact your health care provider.  Removal of the stent: Remove the stent by pulling the string on Friday morning, April 06, 2020  What can I expect after the procedure? After the procedure, it is common to have:  Nausea.  Mild pain when you urinate. You may feel this pain in your lower back or lower abdomen. The pain should stop within a few minutes after you urinate. This may last for up to 1 week.  A small amount of blood in your urine for several days. Follow these instructions at home: Medicines  Take over-the-counter and prescription medicines only as told by your health care provider.  If you were prescribed an antibiotic medicine, take it as told by your health care provider. Do not stop taking the antibiotic even if you start to feel better.  Do not drive for 24 hours if you were given a sedative during your procedure.  Ask your health care provider if the medicine prescribed to you requires you to avoid driving or using heavy machinery. Activity  Rest as told by your health care provider.  Avoid sitting for a long time without moving. Get up to take short walks every 1-2 hours. This is important to improve blood flow and breathing. Ask for help if you feel weak or unsteady.  Return to your normal activities as told by your health care provider. Ask your health care provider what activities are safe for you. General instructions   Watch for any blood in your urine. Call your health care provider if the amount of blood in your urine increases.  If you have a catheter: ? Follow instructions from your health care provider about taking care of your catheter and collection bag. ? Do not take baths, swim, or use a hot tub until your health care  provider approves. Ask your health care provider if you may take showers. You may only be allowed to take sponge baths.  Drink enough fluid to keep your urine pale yellow.  Do not use any products that contain nicotine or tobacco, such as cigarettes, e-cigarettes, and chewing tobacco. These can delay healing after surgery. If you need help quitting, ask your health care provider.  Keep all follow-up visits as told by your health care provider. This is important. Contact a health care provider if:  You have pain that gets worse or does not get better with medicine, especially pain when you urinate.  You have difficulty urinating.  You feel nauseous or you vomit repeatedly during a period of more than 2 days after the procedure. Get help right away if:  Your urine is dark red or has blood clots in it.  You are leaking urine (have incontinence).  The end of the stent comes out of your urethra.  You cannot urinate.  You have sudden, sharp, or severe pain in your abdomen or lower back.  You have a fever.  You have swelling or pain in your legs.  You have difficulty breathing. Summary  After the procedure, it is common to have mild pain when you urinate that goes away within a few minutes after you urinate. This may last for up to 1 week.  Watch for any blood in your urine. Call your health care provider if the amount of blood in  your urine increases.  Take over-the-counter and prescription medicines only as told by your health care provider.  Drink enough fluid to keep your urine pale yellow. This information is not intended to replace advice given to you by your health care provider. Make sure you discuss any questions you have with your health care provider. Document Revised: 03/30/2018 Document Reviewed: 03/31/2018 Elsevier Patient Education  2020 [place] Anesthesia, Adult, Care After This sheet gives you information about how to care for  yourself after your procedure. Your health care provider may also give you more specific instructions. If you have problems or questions, contact your health care provider. What can I expect after the procedure? After the procedure, the following side effects are common:  Pain or discomfort at the IV site.  Nausea.  Vomiting.  Sore throat.  Trouble concentrating.  Feeling cold or chills.  Weak or tired.  Sleepiness and fatigue.  Soreness and body aches. These side effects can affect parts of the body that were not involved in surgery. Follow these instructions at home:  For at least 24 hours after the procedure:  Have a responsible adult stay with you. It is important to have someone help care for you until you are awake and alert.  Rest as needed.  Do not: ? Participate in activities in which you could fall or become injured. ? Drive. ? Use heavy machinery. ? Drink alcohol. ? Take sleeping pills or medicines that cause drowsiness. ? Make important decisions or sign legal documents. ? Take care of children on your own. Eating and drinking  Follow any instructions from your health care provider about eating or drinking restrictions.  When you feel hungry, start by eating small amounts of foods that are soft and easy to digest (bland), such as toast. Gradually return to your regular diet.  Drink enough fluid to keep your urine pale yellow.  If you vomit, rehydrate by drinking water, juice, or clear broth. General instructions  If you have sleep apnea, surgery and certain medicines can increase your risk for breathing problems. Follow instructions from your health care provider about wearing your sleep device: ? Anytime you are sleeping, including during daytime naps. ? While taking prescription pain medicines, sleeping medicines, or medicines that make you drowsy.  Return to your normal activities as told by your health care provider. Ask your health care provider  what activities are safe for you.  Take over-the-counter and prescription medicines only as told by your health care provider.  If you smoke, do not smoke without supervision.  Keep all follow-up visits as told by your health care provider. This is important. Contact a health care provider if:  You have nausea or vomiting that does not get better with medicine.  You cannot eat or drink without vomiting.  You have pain that does not get better with medicine.  You are unable to pass urine.  You develop a skin rash.  You have a fever.  You have redness around your IV site that gets worse. Get help right away if:  You have difficulty breathing.  You have chest pain.  You have blood in your urine or stool, or you vomit blood. Summary  After the procedure, it is common to have a sore throat or nausea. It is also common to feel tired.  Have a responsible adult stay with you for the first 24 hours after general anesthesia. It is important to have  someone help care for you until you are awake and alert.  When you feel hungry, start by eating small amounts of foods that are soft and easy to digest (bland), such as toast. Gradually return to your regular diet.  Drink enough fluid to keep your urine pale yellow.  Return to your normal activities as told by your health care provider. Ask your health care provider what activities are safe for you. This information is not intended to replace advice given to you by your health care provider. Make sure you discuss any questions you have with your health care provider. Document Revised: 06/26/2017 Document Reviewed: 02/06/2017 Elsevier Patient Education  [place].

## 2020-04-03 NOTE — Op Note (Signed)
Preoperative diagnosis: Left ureteral stone, left renal stone Postoperative diagnosis: Same  Procedure: Cystoscopy with left ureteroscopy laser lithotripsy, left ureteral stent exchange  Surgeon: [name]  Anesthesia: General  Indication for procedure: [name] is a 69 year old male who is status post kidney transplant who presented with left hydro and left ureteral stone with sepsis of his native kidney and was stented urgently.  He is brought today for definitive stone surgery.  There is also a 8 to 9 mm left upper pole stone and a few very small mobile left lower pole stones.  Findings: On cystoscopy the urethra and the bladder were unremarkable.  On cystoscopy 2 stones in the left ureter and dropped down into the distal ureter with stent removal.  The stone in the kidney was dusted in the upper pole.  There were some small stones from the lower pole that were dusted.  Description of procedure: After consent was obtained patient brought to the operating room.  After adequate anesthesia he was placed in lithotomy position and prepped and draped in the usual sterile fashion.  A timeout was performed to confirm the patient and procedure.  The cystoscope was passed per urethra and I went ahead and passed a sensor wire adjacent to the stent.  The stent was then grasped and removed through the urethral meatus without difficulty.  I then passed a semirigid scope in the distal ureter 2 stones were noted that likely dropped down when the stent was removed.  A 200 m laser fiber and the stones were dusted.  I then performed ureteroscopy all the way up to the UPJ and no other stones were noted.  A Glidewire was advanced under direct vision.  The semirigid was backed out and a medium access sheath passed without difficulty.  The dual channel digital ureteroscope was advanced and the upper pole stone was dusted.  A few stones in the lower pole were dusted.  Careful inspection of all the calyces noted there to  be no other stones or significant stone fragments.  The access sheath was backed out on the ureteroscope and again the collecting system renal pelvis and ureter inspected on the way out and there was no stone or ureteral injury.  The wire was backloaded on the cystoscope and a 6 and a 6 cm stent advanced.  The wire was removed with a good coil seen in the kidney and a good coil in the bladder.  A string was left on the stent.  He was awakened taken recovery room in stable condition.  Complications: None  Blood loss: Minimal  Specimens: None  Drains: 6 x 26 cm left ureteral stent with string  Disposition: Patient stable to PACU

## 2020-04-03 NOTE — Anesthesia Procedure Notes (Signed)
Procedure Name: Intubation Date/Time: 04/03/2020 7:39 AM Performed by: Eben Burow, CRNA Pre-anesthesia Checklist: Patient identified, Emergency Drugs available, Suction available, Patient being monitored and Timeout performed Patient Re-evaluated:Patient Re-evaluated prior to induction Oxygen Delivery Method: Circle system utilized Preoxygenation: Pre-oxygenation with 100% oxygen Induction Type: IV induction and Rapid sequence Laryngoscope Size: Mac Grade View: Grade I Tube type: Oral Tube size: 7.5 mm Number of attempts: 1 Airway Equipment and Method: Stylet Placement Confirmation: ETT inserted through vocal cords under direct vision,  positive ETCO2 and breath sounds checked- equal and bilateral Secured at: 23 cm Tube secured with: Tape Dental Injury: Teeth and Oropharynx as per pre-operative assessment

## 2020-04-03 NOTE — Interval H&P Note (Signed)
History and Physical Interval Note:  04/03/2020 7:21 AM  [name]  has presented today for surgery, with the diagnosis of LEFT URETERAL STONE.  The various methods of treatment have been discussed with the patient and family. After consideration of risks, benefits and other options for treatment, the patient has consented to  Procedure(s): CYSTOSCOPY/URETEROSCOPY/HOLMIUM LASER/STENT EXCHANGE (Left) as a surgical intervention.  The patient's history has been reviewed, patient examined, no change in status, stable for surgery.  I have reviewed the patient's chart and labs. He is well. No fever, cough or congestion. No gross hematuria or dysuria but some stent pain with voiding. Also has left renal stones.  Questions were answered to the patient's satisfaction.     Festus Aloe

## 2020-04-03 NOTE — Anesthesia Postprocedure Evaluation (Signed)
Anesthesia Post Note  Patient: [name]  Procedure(s) Performed: CYSTOSCOPY/URETEROSCOPY/HOLMIUM LASER/STENT EXCHANGE (Left )     Patient location during evaluation: PACU Anesthesia Type: General Level of consciousness: awake and alert Pain management: pain level controlled Vital Signs Assessment: post-procedure vital signs reviewed and stable Respiratory status: spontaneous breathing, nonlabored ventilation, respiratory function stable and patient connected to nasal cannula oxygen Cardiovascular status: blood pressure returned to baseline and stable Postop Assessment: no apparent nausea or vomiting Anesthetic complications: no   No complications documented.  Last Vitals:  Vitals:   04/03/20 1030 04/03/20 1100  BP: (!) 153/91 (!) 120/97  Pulse: 79 90  Resp: 14   Temp: (!) 36.3 C   SpO2: 97% 98%    Last Pain:  Vitals:   04/03/20 1030  TempSrc:   PainSc: 0-No pain                 Effie Berkshire

## 2020-04-03 NOTE — Transfer of Care (Signed)
Immediate Anesthesia Transfer of Care Note  Patient: [name]  Procedure(s) Performed: CYSTOSCOPY/URETEROSCOPY/HOLMIUM LASER/STENT EXCHANGE (Left )  Patient Location: PACU  Anesthesia Type:General  Level of Consciousness: awake, drowsy and patient cooperative  Airway & Oxygen Therapy: Patient Spontanous Breathing and Patient connected to face mask oxygen  Post-op Assessment: Report given to RN and Post -op Vital signs reviewed and stable  Post vital signs: Reviewed and stable  Last Vitals:  Vitals Value Taken Time  BP 158/76 04/03/20 0848  Temp    Pulse 85 04/03/20 0851  Resp 15 04/03/20 0851  SpO2 100 % 04/03/20 0851  Vitals shown include unvalidated device data.  Last Pain:  Vitals:   04/03/20 0634  TempSrc: Oral  PainSc:       Patients Stated Pain Goal: 3 (59/97/74 1423)  Complications: No complications documented.

## 2020-04-04 ENCOUNTER — Encounter (HOSPITAL_COMMUNITY): Payer: Self-pay | Admitting: Urology

## 2021-11-04 ENCOUNTER — Encounter (HOSPITAL_COMMUNITY): Payer: Self-pay

## 2021-11-04 ENCOUNTER — Inpatient Hospital Stay (HOSPITAL_COMMUNITY)
Admission: EM | Admit: 2021-11-04 | Discharge: 2021-11-07 | DRG: 699 | Disposition: A | Discharge status: Home / Self Care | Payer: Medicare Other | Attending: Internal Medicine | Admitting: Internal Medicine

## 2021-11-04 ENCOUNTER — Other Ambulatory Visit: Payer: Self-pay

## 2021-11-04 ENCOUNTER — Emergency Department (HOSPITAL_COMMUNITY): Payer: Medicare Other

## 2021-11-04 DIAGNOSIS — N179 Acute kidney failure, unspecified: Secondary | ICD-10-CM | POA: Diagnosis present

## 2021-11-04 DIAGNOSIS — R112 Nausea with vomiting, unspecified: Secondary | ICD-10-CM | POA: Diagnosis not present

## 2021-11-04 DIAGNOSIS — E119 Type 2 diabetes mellitus without complications: Secondary | ICD-10-CM

## 2021-11-04 DIAGNOSIS — D631 Anemia in chronic kidney disease: Secondary | ICD-10-CM | POA: Diagnosis present

## 2021-11-04 DIAGNOSIS — L89311 Pressure ulcer of right buttock, stage 1: Secondary | ICD-10-CM | POA: Diagnosis present

## 2021-11-04 DIAGNOSIS — Z940 Kidney transplant status: Secondary | ICD-10-CM

## 2021-11-04 DIAGNOSIS — Z7952 Long term (current) use of systemic steroids: Secondary | ICD-10-CM

## 2021-11-04 DIAGNOSIS — T8619 Other complication of kidney transplant: Secondary | ICD-10-CM | POA: Diagnosis not present

## 2021-11-04 DIAGNOSIS — Z8249 Family history of ischemic heart disease and other diseases of the circulatory system: Secondary | ICD-10-CM

## 2021-11-04 DIAGNOSIS — N183 Chronic kidney disease, stage 3 unspecified: Secondary | ICD-10-CM | POA: Diagnosis present

## 2021-11-04 DIAGNOSIS — Z833 Family history of diabetes mellitus: Secondary | ICD-10-CM

## 2021-11-04 DIAGNOSIS — Z8673 Personal history of transient ischemic attack (TIA), and cerebral infarction without residual deficits: Secondary | ICD-10-CM

## 2021-11-04 DIAGNOSIS — L89321 Pressure ulcer of left buttock, stage 1: Secondary | ICD-10-CM | POA: Diagnosis present

## 2021-11-04 DIAGNOSIS — Z944 Liver transplant status: Secondary | ICD-10-CM

## 2021-11-04 DIAGNOSIS — K7460 Unspecified cirrhosis of liver: Secondary | ICD-10-CM | POA: Diagnosis present

## 2021-11-04 DIAGNOSIS — Z7960 Long term (current) use of unspecified immunomodulators and immunosuppressants: Secondary | ICD-10-CM

## 2021-11-04 DIAGNOSIS — K219 Gastro-esophageal reflux disease without esophagitis: Secondary | ICD-10-CM

## 2021-11-04 DIAGNOSIS — L89152 Pressure ulcer of sacral region, stage 2: Secondary | ICD-10-CM | POA: Diagnosis present

## 2021-11-04 DIAGNOSIS — Z794 Long term (current) use of insulin: Secondary | ICD-10-CM

## 2021-11-04 DIAGNOSIS — G4733 Obstructive sleep apnea (adult) (pediatric): Secondary | ICD-10-CM | POA: Diagnosis present

## 2021-11-04 DIAGNOSIS — E1140 Type 2 diabetes mellitus with diabetic neuropathy, unspecified: Secondary | ICD-10-CM | POA: Diagnosis present

## 2021-11-04 DIAGNOSIS — E1122 Type 2 diabetes mellitus with diabetic chronic kidney disease: Secondary | ICD-10-CM | POA: Diagnosis present

## 2021-11-04 DIAGNOSIS — Z7982 Long term (current) use of aspirin: Secondary | ICD-10-CM

## 2021-11-04 DIAGNOSIS — K529 Noninfective gastroenteritis and colitis, unspecified: Secondary | ICD-10-CM | POA: Diagnosis present

## 2021-11-04 DIAGNOSIS — F32A Depression, unspecified: Secondary | ICD-10-CM | POA: Diagnosis present

## 2021-11-04 DIAGNOSIS — Y830 Surgical operation with transplant of whole organ as the cause of abnormal reaction of the patient, or of later complication, without mention of misadventure at the time of the procedure: Secondary | ICD-10-CM | POA: Diagnosis present

## 2021-11-04 DIAGNOSIS — Z66 Do not resuscitate: Secondary | ICD-10-CM | POA: Diagnosis present

## 2021-11-04 DIAGNOSIS — G8220 Paraplegia, unspecified: Secondary | ICD-10-CM | POA: Diagnosis present

## 2021-11-04 DIAGNOSIS — Z79899 Other long term (current) drug therapy: Secondary | ICD-10-CM

## 2021-11-04 DIAGNOSIS — I129 Hypertensive chronic kidney disease with stage 1 through stage 4 chronic kidney disease, or unspecified chronic kidney disease: Secondary | ICD-10-CM | POA: Diagnosis present

## 2021-11-04 DIAGNOSIS — Z8616 Personal history of COVID-19: Secondary | ICD-10-CM

## 2021-11-04 DIAGNOSIS — K7581 Nonalcoholic steatohepatitis (NASH): Secondary | ICD-10-CM | POA: Diagnosis present

## 2021-11-04 DIAGNOSIS — Z9049 Acquired absence of other specified parts of digestive tract: Secondary | ICD-10-CM

## 2021-11-04 LAB — CBC WITH DIFFERENTIAL/PLATELET
Abs Immature Granulocytes: 0.04 10*3/uL (ref 0.00–0.07)
Basophils Absolute: 0 10*3/uL (ref 0.0–0.1)
Basophils Relative: 0 %
Eosinophils Absolute: 0.1 10*3/uL (ref 0.0–0.5)
Eosinophils Relative: 2 %
HCT: 52.6 % — ABNORMAL HIGH (ref 39.0–52.0)
Hemoglobin: 17.8 g/dL — ABNORMAL HIGH (ref 13.0–17.0)
Immature Granulocytes: 1 %
Lymphocytes Relative: 10 %
Lymphs Abs: 0.7 10*3/uL (ref 0.7–4.0)
MCH: 27.3 pg (ref 26.0–34.0)
MCHC: 33.8 g/dL (ref 30.0–36.0)
MCV: 80.8 fL (ref 80.0–100.0)
Monocytes Absolute: 1 10*3/uL (ref 0.1–1.0)
Monocytes Relative: 15 %
Neutro Abs: 4.7 10*3/uL (ref 1.7–7.7)
Neutrophils Relative %: 72 %
Platelets: 107 10*3/uL — ABNORMAL LOW (ref 150–400)
RBC: 6.51 MIL/uL — ABNORMAL HIGH (ref 4.22–5.81)
RDW: 15.3 % (ref 11.5–15.5)
WBC: 6.6 10*3/uL (ref 4.0–10.5)
nRBC: 0 % (ref 0.0–0.2)

## 2021-11-04 LAB — URINALYSIS, ROUTINE W REFLEX MICROSCOPIC
Bacteria, UA: NONE SEEN
Bilirubin Urine: NEGATIVE
Glucose, UA: NEGATIVE mg/dL
Hgb urine dipstick: NEGATIVE
Ketones, ur: 20 mg/dL — AB
Leukocytes,Ua: NEGATIVE
Nitrite: NEGATIVE
Protein, ur: 30 mg/dL — AB
Specific Gravity, Urine: 1.015 (ref 1.005–1.030)
pH: 5 (ref 5.0–8.0)

## 2021-11-04 LAB — COMPREHENSIVE METABOLIC PANEL
ALT: 37 U/L (ref 0–44)
AST: 21 U/L (ref 15–41)
Albumin: 4.2 g/dL (ref 3.5–5.0)
Alkaline Phosphatase: 97 U/L (ref 38–126)
Anion gap: 15 (ref 5–15)
BUN: 27 mg/dL — ABNORMAL HIGH (ref 8–23)
CO2: 23 mmol/L (ref 22–32)
Calcium: 9.4 mg/dL (ref 8.9–10.3)
Chloride: 98 mmol/L (ref 98–111)
Creatinine, Ser: 2.03 mg/dL — ABNORMAL HIGH (ref 0.61–1.24)
GFR, Estimated: 35 mL/min — ABNORMAL LOW (ref >60)
Glucose, Bld: 173 mg/dL — ABNORMAL HIGH (ref 70–99)
Potassium: 4.5 mmol/L (ref 3.5–5.1)
Sodium: 136 mmol/L (ref 135–145)
Total Bilirubin: 1.8 mg/dL — ABNORMAL HIGH (ref 0.3–1.2)
Total Protein: 7.9 g/dL (ref 6.5–8.1)

## 2021-11-04 LAB — HEMOGLOBIN A1C
Hgb A1c MFr Bld: 7.4 % — ABNORMAL HIGH (ref 4.8–5.6)
Mean Plasma Glucose: 165.68 mg/dL

## 2021-11-04 LAB — LACTIC ACID, PLASMA
Lactic Acid, Venous: 1.7 mmol/L (ref 0.5–1.9)
Lactic Acid, Venous: 1.6 mmol/L (ref 0.5–1.9)

## 2021-11-04 LAB — LIPASE, BLOOD: Lipase: 265 U/L — ABNORMAL HIGH (ref 11–51)

## 2021-11-04 LAB — CBG MONITORING, ED: Glucose-Capillary: 162 mg/dL — ABNORMAL HIGH (ref 70–99)

## 2021-11-04 LAB — GLUCOSE, CAPILLARY: Glucose-Capillary: 174 mg/dL — ABNORMAL HIGH (ref 70–99)

## 2021-11-04 MED ORDER — TACROLIMUS ER 1 MG PO TB24
2.0000 mg | ORAL_TABLET | Freq: Every day | ORAL | Status: DC
  Administered 2021-11-05 – 2021-11-07 (×3): 2 mg via ORAL
  Filled 2021-11-04 (×4): qty 2

## 2021-11-04 MED ORDER — SODIUM CHLORIDE 0.9 % IV SOLN: INTRAVENOUS | Status: DC

## 2021-11-04 MED ORDER — PREDNISONE 5 MG PO TABS
5.0000 mg | ORAL_TABLET | Freq: Every day | ORAL | Status: DC
  Administered 2021-11-05 – 2021-11-07 (×3): 5 mg via ORAL
  Filled 2021-11-04 (×3): qty 1

## 2021-11-04 MED ORDER — PREGABALIN 50 MG PO CAPS
100.0000 mg | ORAL_CAPSULE | Freq: Two times a day (BID) | ORAL | Status: DC
  Administered 2021-11-04 – 2021-11-07 (×6): 100 mg via ORAL
  Filled 2021-11-04 (×6): qty 2

## 2021-11-04 MED ORDER — ASPIRIN 81 MG PO CHEW
81.0000 mg | CHEWABLE_TABLET | Freq: Every day | ORAL | Status: DC
  Administered 2021-11-05 – 2021-11-07 (×3): 81 mg via ORAL
  Filled 2021-11-04 (×3): qty 1

## 2021-11-04 MED ORDER — INSULIN ASPART 100 UNIT/ML IJ SOLN
0.0000 [IU] | Freq: Every day | INTRAMUSCULAR | Status: DC
  Filled 2021-11-04: qty 0.05

## 2021-11-04 MED ORDER — SODIUM CHLORIDE 0.9 % IV BOLUS
500.0000 mL | Freq: Once | INTRAVENOUS | Status: AC
  Administered 2021-11-04: 500 mL via INTRAVENOUS

## 2021-11-04 MED ORDER — PROCHLORPERAZINE EDISYLATE 10 MG/2ML IJ SOLN
10.0000 mg | Freq: Four times a day (QID) | INTRAMUSCULAR | Status: DC | PRN
Start: 2021-11-04 — End: 2021-11-07
  Administered 2021-11-04 – 2021-11-05 (×3): 10 mg via INTRAVENOUS
  Filled 2021-11-04 (×4): qty 2

## 2021-11-04 MED ORDER — TRAZODONE HCL 50 MG PO TABS
50.0000 mg | ORAL_TABLET | Freq: Every day | ORAL | Status: DC
  Administered 2021-11-04 – 2021-11-06 (×3): 50 mg via ORAL
  Filled 2021-11-04 (×3): qty 1

## 2021-11-04 MED ORDER — MYCOPHENOLATE MOFETIL 250 MG PO CAPS
500.0000 mg | ORAL_CAPSULE | Freq: Two times a day (BID) | ORAL | Status: DC
  Administered 2021-11-04 – 2021-11-07 (×6): 500 mg via ORAL
  Filled 2021-11-04 (×6): qty 2

## 2021-11-04 MED ORDER — PANTOPRAZOLE SODIUM 40 MG PO TBEC
40.0000 mg | DELAYED_RELEASE_TABLET | Freq: Every day | ORAL | Status: DC
  Administered 2021-11-05 – 2021-11-07 (×3): 40 mg via ORAL
  Filled 2021-11-04 (×3): qty 1

## 2021-11-04 MED ORDER — INSULIN ASPART 100 UNIT/ML IJ SOLN
0.0000 [IU] | Freq: Three times a day (TID) | INTRAMUSCULAR | Status: DC
  Administered 2021-11-04: 2 [IU] via SUBCUTANEOUS
  Administered 2021-11-05: 1 [IU] via SUBCUTANEOUS
  Administered 2021-11-05 – 2021-11-06 (×4): 2 [IU] via SUBCUTANEOUS
  Administered 2021-11-06: 3 [IU] via SUBCUTANEOUS
  Administered 2021-11-07: 2 [IU] via SUBCUTANEOUS
  Administered 2021-11-07: 1 [IU] via SUBCUTANEOUS
  Filled 2021-11-04: qty 0.09

## 2021-11-04 MED ORDER — SERTRALINE HCL 100 MG PO TABS
100.0000 mg | ORAL_TABLET | Freq: Every day | ORAL | Status: DC
  Administered 2021-11-04 – 2021-11-07 (×4): 100 mg via ORAL
  Filled 2021-11-04 (×4): qty 1

## 2021-11-04 MED ORDER — MORPHINE SULFATE (PF) 2 MG/ML IV SOLN
2.0000 mg | INTRAVENOUS | Status: DC | PRN
  Administered 2021-11-05 (×2): 2 mg via INTRAVENOUS
  Filled 2021-11-04 (×3): qty 1

## 2021-11-04 MED ORDER — HYDRALAZINE HCL 20 MG/ML IJ SOLN: 10.0000 mg | Freq: Three times a day (TID) | INTRAMUSCULAR | Status: DC | PRN

## 2021-11-04 MED ORDER — TACROLIMUS ER 1 MG PO TB24
2.0000 mg | ORAL_TABLET | Freq: Every day | ORAL | Status: DC
  Filled 2021-11-04: qty 2

## 2021-11-04 MED ORDER — ONDANSETRON HCL 4 MG/2ML IJ SOLN
4.0000 mg | Freq: Once | INTRAMUSCULAR | Status: AC
  Administered 2021-11-04: 4 mg via INTRAVENOUS
  Filled 2021-11-04: qty 2

## 2021-11-04 NOTE — ED Notes (Signed)
Urine sent to lab 

## 2021-11-04 NOTE — ED Provider Notes (Signed)
?[facility] DEPT ?Provider Note ? ? ?CSN: [id number] ?Arrival date & time: 11/04/21  1241 ? ?  ? ?History ? ?Chief Complaint  ?Patient presents with  ? Nausea  ? Emesis  ? ? ?[name] is a 71 y.o. male.  He has a prior history of a liver and kidney transplant in 2017 at [facility].  He is on chronic immunosuppression.  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  He is here with a complaint of nausea and vomiting, right-sided abdominal pain that started 3 days ago.  Vomits 4-5 times a day.  No blood.  No diarrhea or urinary symptoms.  No fevers or chills.  Has been trying nausea medication without improvement.  No sick contacts or recent travel. ? ?The history is provided by the patient and the spouse.  ?Emesis ?Severity:  Moderate ?Duration:  4 days ?Timing:  Intermittent ?Number of daily episodes:  5 ?Quality:  Stomach contents ?Progression:  Unchanged ?Chronicity:  New ?Relieved by:  Nothing ?Worsened by:  Nothing ?Ineffective treatments:  Antiemetics ?Associated symptoms: abdominal pain   ?Associated symptoms: no cough, no diarrhea, no fever, no headaches and no sore throat   ?Risk factors: prior abdominal surgery   ?Risk factors: no sick contacts, no suspect food intake and no travel to endemic areas   ? ?  ? ?Home Medications ?Prior to Admission medications   ?Medication Sig Start Date End Date Taking? Authorizing Provider  ?acetaminophen (TYLENOL) 325 MG tablet Take 650 mg by mouth every 8 (eight) hours as needed for mild pain or headache.  11/20/15   [provider]  ?albuterol (VENTOLIN HFA) 108 (90 Base) MCG/ACT inhaler Inhale 2 puffs into the lungs every 6 (six) hours as needed for wheezing or shortness of breath. 03/17/20   Thurnell Lose, MD  ?aspirin EC 81 MG tablet Take 81 mg by mouth daily.  11/15/15   [provider]  ?[employer] MUCUS EXTENDED RELEASE 600 MG 12 hr tablet Take 600 mg by mouth every 12 (twelve) hours as needed for cough or to loosen  phlegm.  03/18/20   [provider]  ?ferrous sulfate 325 (65 FE) MG tablet Take 325 mg by mouth at bedtime.    [provider]  ?HUMALOG KWIKPEN 100 UNIT/ML KiwkPen Inject 24 Units into the skin in the morning and at bedtime.  04/11/16   [provider]  ?mycophenolate (CELLCEPT) 250 MG capsule Take 500 mg by mouth 2 (two) times daily.  06/09/17   [provider]  ?omeprazole (PRILOSEC) 20 MG capsule Take 20 mg by mouth 2 (two) times daily. 02/13/20   [provider]  ?predniSONE (DELTASONE) 5 MG tablet Take 5 mg by mouth daily.  01/01/16   [provider]  ?pregabalin (LYRICA) 50 MG capsule TAKE 1 CAPSULE BY MOUTH IN THE MORNING, TAKE 1 CAPSULE MIDDAY AND 2 IN THE EVENING ?Patient taking differently: Take 50-100 mg by mouth See admin instructions. Take '50mg'$  in the morning, '100mg'$  in the evening 01/26/19   [name], NP  ?Probiotic Product (ALIGN) 4 MG CAPS Take 4 mg by mouth See admin instructions. Take two hour before or two hours after taking the antibiotic    [provider]  ?sertraline (ZOLOFT) 50 MG tablet Take 50 mg by mouth daily.    [provider]  ?Tacrolimus (ENVARSUS XR) 1 MG TB24 Take 4 mg by mouth daily.     [provider]  ?traZODone (DESYREL) 50  MG tablet Take 50 mg by mouth at bedtime. 02/10/20   [provider]  ?   ? ?Allergies    ?Patient has no known allergies.   ? ?Review of Systems   ?Review of Systems  ?Constitutional:  Negative for fever.  ?HENT:  Negative for sore throat.   ?Eyes:  Negative for visual disturbance.  ?Respiratory:  Negative for cough.   ?Cardiovascular:  Negative for chest pain.  ?Gastrointestinal:  Positive for abdominal pain, nausea and vomiting. Negative for diarrhea.  ?Genitourinary:  Negative for dysuria.  ?Musculoskeletal:  Positive for gait problem.  ?Neurological:  Negative for headaches.  ? ?Physical Exam ?Updated Vital Signs ?BP (!) 140/91   Pulse 74   Temp 97.8 ?F (36.6 ?C)  (Oral)   Resp 18   Ht '5\' 10"'$  (1.778 m)   Wt 63.5 kg   SpO2 96%   BMI 20.09 kg/m?  ?Physical Exam ?Vitals and nursing note reviewed.  ?Constitutional:   ?   General: He is not in acute distress. ?   Appearance: Normal appearance. He is well-developed.  ?HENT:  ?   Head: Normocephalic and atraumatic.  ?Eyes:  ?   Conjunctiva/sclera: Conjunctivae normal.  ?Cardiovascular:  ?   Rate and Rhythm: Normal rate and regular rhythm.  ?   Heart sounds: No murmur heard. ?Pulmonary:  ?   Effort: Pulmonary effort is normal. No respiratory distress.  ?   Breath sounds: Normal breath sounds.  ?Abdominal:  ?   Palpations: Abdomen is soft.  ?   Tenderness: There is no abdominal tenderness. There is no guarding or rebound.  ?Musculoskeletal:     ?   General: No swelling or tenderness.  ?   Cervical back: Neck supple.  ?Skin: ?   General: Skin is warm and dry.  ?   Capillary Refill: Capillary refill takes less than 2 seconds.  ?Neurological:  ?   Mental Status: He is alert. Mental status is at baseline.  ? ? ?ED Results / Procedures / Treatments   ?Labs ?(all labs ordered are listed, but only abnormal results are displayed) ?Labs Reviewed  ?COMPREHENSIVE METABOLIC PANEL - Abnormal; Notable for the following components:  ?    Result Value  ? Glucose, Bld 173 (*)   ? BUN 27 (*)   ? Creatinine, Ser 2.03 (*)   ? Total Bilirubin 1.8 (*)   ? GFR, Estimated 35 (*)   ? All other components within normal limits  ?CBC WITH DIFFERENTIAL/PLATELET - Abnormal; Notable for the following components:  ? RBC 6.51 (*)   ? Hemoglobin 17.8 (*)   ? HCT 52.6 (*)   ? Platelets 107 (*)   ? All other components within normal limits  ?LIPASE, BLOOD - Abnormal; Notable for the following components:  ? Lipase 265 (*)   ? All other components within normal limits  ?URINALYSIS, ROUTINE W REFLEX MICROSCOPIC - Abnormal; Notable for the following components:  ? Ketones, ur 20 (*)   ? Protein, ur 30 (*)   ? All other components within normal limits  ?CBG MONITORING,  ED - Abnormal; Notable for the following components:  ? Glucose-Capillary 162 (*)   ? All other components within normal limits  ?LACTIC ACID, PLASMA  ?LACTIC ACID, PLASMA  ?MYCOPHENOLIC ACID (CELLCEPT)  ?TACROLIMUS LEVEL  ?HEMOGLOBIN A1C  ?HIV ANTIBODY (ROUTINE TESTING W REFLEX)  ?COMPREHENSIVE METABOLIC PANEL  ?CBC  ? ? ?EKG ?EKG Interpretation ? ?Date/Time:  Monday Nov 04 2021 12:56:13 EDT ?Ventricular Rate:  82 ?PR Interval:  347 ?QRS Duration: 92 ?QT Interval:  357 ?QTC Calculation: 417 ?R Axis:   71 ?Text Interpretation: Sinus rhythm Prolonged PR interval Consider anterior infarct Minimal ST depression, inferior leads No significant change since prior 9/21 Confirmed by Aletta Edouard [phone]) on 11/04/2021 2:14:03 PM ? ?Radiology ?CT Renal Stone Study ? ?Result Date: 11/04/2021 ?CLINICAL DATA:  Flank pain, kidney stone suspected hx renal, liver transplant, AKI, elevated lipase EXAM: CT ABDOMEN AND PELVIS WITHOUT CONTRAST TECHNIQUE: Multidetector CT imaging of the abdomen and pelvis was performed following the standard protocol without IV contrast. RADIATION DOSE REDUCTION: This exam was performed according to the departmental dose-optimization program which includes automated exposure control, adjustment of the mA and/or kV according to patient size and/or use of iterative reconstruction technique. COMPARISON:  CT 03/10/2020, CT 08/16/2004 FINDINGS: Lower chest: There is a benign 4 mm nodule in the right lung base, unchanged since February 2006. Lingular atelectasis/scarring. No acute findings. Hepatobiliary: Postsurgical changes of liver transplantation. The gallbladder surgically absent. Pancreas: Unremarkable. No pancreatic ductal dilatation or surrounding inflammatory changes. Spleen: Splenomegaly. Adrenals/Urinary Tract: Adrenal glands are unremarkable. There is a right lower quadrant renal transplant without hydronephrosis or perinephric fluid collection. There is decreased peritransplant stranding in  comparison to prior CT in September 2021. There is bilateral native renal cortical thinning. There is no hydronephrosis or nephroureterolithiasis. There is an exophytic native right lower pole renal cyst with probable s

## 2021-11-04 NOTE — ED Triage Notes (Signed)
Pt BI by EMS with N+V 3 days. Patient is nonambulatory.  ?

## 2021-11-04 NOTE — H&P (Signed)
?History and Physical  ? ? ?Patient: [name] DOB: 1951/07/01 ?DOA: 11/04/2021 ?DOS: the patient was seen and examined on 11/04/2021 ?PCP: Nicoletta Dress, MD  ?Patient coming from: Home ? ?Chief Complaint:  ?Chief Complaint  ?Patient presents with  ? Nausea  ? Emesis  ? ?HPI: [name] is a 71 y.o. male with medical history significant of renal and liver transplant, HTN, CHB, OSA, DM2. Presenting with intractable N/V. He reports that he was in his normal state of health until 3 days ago. He and his wife went to dinner. Shortly after, he began having N/V. This continued into the next morning. He was able to keep some fluids and meds down initially, but then his nausea worsened. It continued through this morning. He has not had any fever or diarrhea. He wife has not been sick during this time. He's had no hematemesis. He's not had change in his urine. He has had abdominal pain along his right side as his vomiting has progressed. When his symptoms didn't improve this morning, he decided to come to the ED for assistance. He denies any other aggravating or alleviating factors.   ? ?Review of Systems: As mentioned in the history of present illness. All other systems reviewed and are negative. ?Past Medical History:  ?Diagnosis Date  ? Abscess in epidural space of cervical spine 08/13/2015  ? Acute hepatic encephalopathy (HCC)   ? Acute renal failure superimposed on stage 3 chronic kidney disease ([place])   ? Anemia of chronic disease   ? Ascites   ? Bilateral carpal tunnel syndrome 01/27/2018  ? Bleeding esophageal varices (HCC)   ? Cataract   ? Cervical disc disorder   ? Coagulopathy ([place])   ? Depression   ? Diabetes mellitus type II, controlled ([place])   ? Diabetic neuropathy ([place])   ? Dysphagia   ? Esophageal stricture   ? First degree AV block   ? GERD (gastroesophageal reflux disease)   ? Gout   ? History of blood transfusion   ? History of COVID-19 03/10/2020  ? History of kidney stones   ?  Insomnia   ? NASH (nonalcoholic steatohepatitis)   ? NASH (nonalcoholic steatohepatitis)   ? OSA (obstructive sleep apnea)   ? PRIOR USE OF CPAP  ? Peripheral neuropathy   ? Protein-calorie malnutrition, severe ([place])   ? Secondary biliary cirrhosis ([place])   ? Stroke [facility])   ? Thrombocytopenia ([place])   ? Vitamin D deficiency   ? Wears glasses   ? ?Past Surgical History:  ?Procedure Laterality Date  ? COLONOSCOPY    ? CYSTOSCOPY WITH STENT PLACEMENT Left 03/10/2020  ? Procedure: CYSTOSCOPY WITH RETROGRADE AND LEFT STENT PLACEMENT;  Surgeon: Festus Aloe, MD;  Location: [facility];  Service: Urology;  Laterality: Left;  ? CYSTOSCOPY/URETEROSCOPY/HOLMIUM LASER/STENT PLACEMENT Left 04/03/2020  ? Procedure: CYSTOSCOPY/URETEROSCOPY/HOLMIUM LASER/STENT EXCHANGE;  Surgeon: Festus Aloe, MD;  Location: [facility];  Service: Urology;  Laterality: Left;  ? ESOPHAGEAL BANDING Bilateral 11/29/2013  ? Procedure: ESOPHAGEAL BANDING;  Surgeon: Irene Shipper, MD;  Location: [facility];  Service: Endoscopy;  Laterality: Bilateral;  ? ESOPHAGEAL BANDING N/A 06/13/2014  ? Procedure: ESOPHAGEAL BANDING;  Surgeon: Irene Shipper, MD;  Location: [facility];  Service: Endoscopy;  Laterality: N/A;  ? ESOPHAGOGASTRODUODENOSCOPY  08/01/2011  ? Procedure: ESOPHAGOGASTRODUODENOSCOPY (EGD);  Surgeon: Scarlette Shorts, MD;  [facility];  Service: Endoscopy;  Laterality: N/A;  ? ESOPHAGOGASTRODUODENOSCOPY N/A 10/27/2012  ? Procedure: ESOPHAGOGASTRODUODENOSCOPY (EGD);  Surgeon: Herbie Baltimore  Shaaron Adler, MD;  Location[facility];  Service: Endoscopy;  Laterality: N/A;  ? ESOPHAGOGASTRODUODENOSCOPY N/A 11/22/2012  ? Procedure: ESOPHAGOGASTRODUODENOSCOPY (EGD);  Surgeon: Irene Shipper, MD;  [facility];  Service: Endoscopy;  Laterality: N/A;  ? ESOPHAGOGASTRODUODENOSCOPY N/A 11/29/2013  ? Procedure: ESOPHAGOGASTRODUODENOSCOPY (EGD);  Surgeon: Irene Shipper, MD;  [facility];  Service: Endoscopy;  Laterality: N/A;  ? ESOPHAGOGASTRODUODENOSCOPY  N/A 06/13/2014  ? Procedure: ESOPHAGOGASTRODUODENOSCOPY (EGD);  Surgeon: Irene Shipper, MD;  [facility];  Service: Endoscopy;  Laterality: N/A;  ? KIDNEY STONE SURGERY  yrs ago  ? KIDNEY TRANSPLANT    ? LIVER TRANSPLANT  11/2015  ? UPPER GASTROINTESTINAL ENDOSCOPY    ? ?Social History:  reports that he has never smoked. He has never used smokeless tobacco. He reports that he does not drink alcohol and does not use drugs. ? ?No Known Allergies ? ?Family History  ?Problem Relation Age of Onset  ? Diabetes Father   ? Heart disease Father   ? Aneurysm Mother   ? Heart disease Other   ?     neice  ? Diabetes Other   ?     niece  ? Colon cancer Neg Hx   ? Anesthesia problems Neg Hx   ? Hypotension Neg Hx   ? Malignant hyperthermia Neg Hx   ? Pseudochol deficiency Neg Hx   ? ? ?Prior to Admission medications   ?Medication Sig Start Date End Date Taking? Authorizing Provider  ?acetaminophen (TYLENOL) 325 MG tablet Take 650 mg by mouth every 8 (eight) hours as needed for mild pain or headache.  11/20/15   [provider]  ?albuterol (VENTOLIN HFA) 108 (90 Base) MCG/ACT inhaler Inhale 2 puffs into the lungs every 6 (six) hours as needed for wheezing or shortness of breath. 03/17/20   Thurnell Lose, MD  ?aspirin EC 81 MG tablet Take 81 mg by mouth daily.  11/15/15   [provider]  ?[employer] MUCUS EXTENDED RELEASE 600 MG 12 hr tablet Take 600 mg by mouth every 12 (twelve) hours as needed for cough or to loosen phlegm.  03/18/20   [provider]  ?ferrous sulfate 325 (65 FE) MG tablet Take 325 mg by mouth at bedtime.    [provider]  ?HUMALOG KWIKPEN 100 UNIT/ML KiwkPen Inject 24 Units into the skin in the morning and at bedtime.  04/11/16   [provider]  ?mycophenolate (CELLCEPT) 250 MG capsule Take 500 mg by mouth 2 (two) times daily.  06/09/17   [provider]  ?omeprazole (PRILOSEC) 20 MG capsule Take 20 mg by mouth 2 (two) times daily. 02/13/20   [provider]  ?predniSONE (DELTASONE) 5 MG tablet Take 5 mg by mouth daily.  01/01/16   [provider]  ?pregabalin (LYRICA) 50 MG capsule TAKE 1 CAPSULE BY MOUTH IN THE MORNING, TAKE 1 CAPSULE MIDDAY AND 2 IN THE EVENING ?Patient taking differently: Take 50-100 mg by mouth See admin instructions. Take '50mg'$  in the morning, '100mg'$  in the evening 01/26/19   [name], NP  ?Probiotic Product (ALIGN) 4 MG CAPS Take 4 mg by mouth See admin instructions. Take two hour before or two hours after taking the antibiotic    [provider]  ?sertraline (ZOLOFT) 50 MG tablet Take 50 mg by mouth daily.    [provider]  ?Tacrolimus (ENVARSUS XR) 1 MG TB24 Take 4 mg by mouth daily.     [provider]  ?  traZODone (DESYREL) 50 MG tablet Take 50 mg by mouth at bedtime. 02/10/20   [provider]  ? ? ?Physical Exam: ?Vitals:  ? 11/04/21 1330 11/04/21 1400 11/04/21 1500 11/04/21 1600  ?BP: (!) 140/91 (!) 151/96 (!) 157/87 (!) 153/83  ?Pulse: 74 83 85 85  ?Resp: '18 18 18 18  '$ ?Temp:      ?TempSrc:      ?SpO2: 96% 98% 96% 96%  ?Weight:      ?Height:      ? ?General: 71 y.o. male resting in bed in NAD ?Eyes: PERRL, normal sclera ?ENMT: Nares patent w/o discharge, orophaynx clear, dentition normal, ears w/o discharge/lesions/ulcers ?Neck: Supple, trachea midline ?Cardiovascular: RRR, +S1, S2, no m/g/r, equal pulses throughout ?Respiratory: CTABL, no w/r/r, normal WOB ?GI: BS+, ND, soft, mild TTP RUQ, no masses noted, no organomegaly noted ?MSK: No e/c/c ?Neuro: A&O x 3, no focal deficits ?Psyc: Appropriate interaction and affect, calm/cooperative ? ?Data Reviewed: ? ?Na+ 136 ?K+  4.5 ?CO2  23 ?Glucose 173 ?BUN  27 ?Scr  2.03 ?Lipase  265 ?T bili 1.8 ?WBC  6.6 ?HGB 17.8 ?Plt  107 ? ?CT renal stone study ?No acute findings in the abdomen or pelvis.  Normal appendix. ?Postsurgical changes of right lower quadrant renal transplantation. ?No hydronephrosis or perinephric fluid collection  involving the ?transplant kidney. Decreased peritransplant inflammatory stranding ?in comparison to prior CT in September 2021. ?Postsurgical changes of liver transplantation. ? ?Assessment and Plan: ?No notes have b

## 2021-11-04 NOTE — Progress Notes (Signed)
? ?  This pt is active with [facility] which is a home based Palliative care program provided by [facility]. Pt has been receiving services of nursing visits 1-3 times a month and SW visits. The pt is being seen for [name] to assist in management with his chronic illness hypertensive chronic kidney disease. He was instructed to take zofran for nausea by our staff TID on 11/02/21 to assist with the nausea and vomiting. At follow up he was able to keep clear liquids down. The wife [name] has notified [name] today that pt is being brought to hospital because she is concerned about dehydration.  ? ?We will continue to follow while in hospital and assist with coordination of care at d/c. Webb Silversmith RN [phone]  ?

## 2021-11-05 DIAGNOSIS — F32A Depression, unspecified: Secondary | ICD-10-CM | POA: Diagnosis present

## 2021-11-05 DIAGNOSIS — N183 Chronic kidney disease, stage 3 unspecified: Secondary | ICD-10-CM | POA: Diagnosis present

## 2021-11-05 DIAGNOSIS — Z944 Liver transplant status: Secondary | ICD-10-CM

## 2021-11-05 DIAGNOSIS — K529 Noninfective gastroenteritis and colitis, unspecified: Secondary | ICD-10-CM | POA: Diagnosis present

## 2021-11-05 DIAGNOSIS — R112 Nausea with vomiting, unspecified: Secondary | ICD-10-CM | POA: Diagnosis present

## 2021-11-05 DIAGNOSIS — G8220 Paraplegia, unspecified: Secondary | ICD-10-CM | POA: Diagnosis present

## 2021-11-05 DIAGNOSIS — Y830 Surgical operation with transplant of whole organ as the cause of abnormal reaction of the patient, or of later complication, without mention of misadventure at the time of the procedure: Secondary | ICD-10-CM | POA: Diagnosis present

## 2021-11-05 DIAGNOSIS — L89311 Pressure ulcer of right buttock, stage 1: Secondary | ICD-10-CM | POA: Diagnosis present

## 2021-11-05 DIAGNOSIS — E1122 Type 2 diabetes mellitus with diabetic chronic kidney disease: Secondary | ICD-10-CM | POA: Diagnosis present

## 2021-11-05 DIAGNOSIS — Z8249 Family history of ischemic heart disease and other diseases of the circulatory system: Secondary | ICD-10-CM | POA: Diagnosis not present

## 2021-11-05 DIAGNOSIS — L89321 Pressure ulcer of left buttock, stage 1: Secondary | ICD-10-CM | POA: Diagnosis present

## 2021-11-05 DIAGNOSIS — Z833 Family history of diabetes mellitus: Secondary | ICD-10-CM | POA: Diagnosis not present

## 2021-11-05 DIAGNOSIS — K219 Gastro-esophageal reflux disease without esophagitis: Secondary | ICD-10-CM | POA: Diagnosis present

## 2021-11-05 DIAGNOSIS — E1165 Type 2 diabetes mellitus with hyperglycemia: Secondary | ICD-10-CM

## 2021-11-05 DIAGNOSIS — Z7982 Long term (current) use of aspirin: Secondary | ICD-10-CM | POA: Diagnosis not present

## 2021-11-05 DIAGNOSIS — K7581 Nonalcoholic steatohepatitis (NASH): Secondary | ICD-10-CM | POA: Diagnosis present

## 2021-11-05 DIAGNOSIS — Z940 Kidney transplant status: Secondary | ICD-10-CM | POA: Diagnosis not present

## 2021-11-05 DIAGNOSIS — D631 Anemia in chronic kidney disease: Secondary | ICD-10-CM | POA: Diagnosis present

## 2021-11-05 DIAGNOSIS — E1140 Type 2 diabetes mellitus with diabetic neuropathy, unspecified: Secondary | ICD-10-CM | POA: Diagnosis present

## 2021-11-05 DIAGNOSIS — Z794 Long term (current) use of insulin: Secondary | ICD-10-CM

## 2021-11-05 DIAGNOSIS — K7460 Unspecified cirrhosis of liver: Secondary | ICD-10-CM | POA: Diagnosis present

## 2021-11-05 DIAGNOSIS — T8619 Other complication of kidney transplant: Secondary | ICD-10-CM | POA: Diagnosis present

## 2021-11-05 DIAGNOSIS — N179 Acute kidney failure, unspecified: Secondary | ICD-10-CM | POA: Diagnosis present

## 2021-11-05 DIAGNOSIS — I129 Hypertensive chronic kidney disease with stage 1 through stage 4 chronic kidney disease, or unspecified chronic kidney disease: Secondary | ICD-10-CM | POA: Diagnosis present

## 2021-11-05 DIAGNOSIS — Z8673 Personal history of transient ischemic attack (TIA), and cerebral infarction without residual deficits: Secondary | ICD-10-CM | POA: Diagnosis not present

## 2021-11-05 DIAGNOSIS — L89152 Pressure ulcer of sacral region, stage 2: Secondary | ICD-10-CM | POA: Diagnosis present

## 2021-11-05 DIAGNOSIS — G4733 Obstructive sleep apnea (adult) (pediatric): Secondary | ICD-10-CM | POA: Diagnosis present

## 2021-11-05 DIAGNOSIS — Z66 Do not resuscitate: Secondary | ICD-10-CM | POA: Diagnosis present

## 2021-11-05 DIAGNOSIS — Z8616 Personal history of COVID-19: Secondary | ICD-10-CM | POA: Diagnosis not present

## 2021-11-05 LAB — COMPREHENSIVE METABOLIC PANEL
ALT: 28 U/L (ref 0–44)
AST: 18 U/L (ref 15–41)
Albumin: 3.5 g/dL (ref 3.5–5.0)
Alkaline Phosphatase: 81 U/L (ref 38–126)
Anion gap: 15 (ref 5–15)
BUN: 26 mg/dL — ABNORMAL HIGH (ref 8–23)
CO2: 19 mmol/L — ABNORMAL LOW (ref 22–32)
Calcium: 8.5 mg/dL — ABNORMAL LOW (ref 8.9–10.3)
Chloride: 104 mmol/L (ref 98–111)
Creatinine, Ser: 1.76 mg/dL — ABNORMAL HIGH (ref 0.61–1.24)
GFR, Estimated: 41 mL/min — ABNORMAL LOW (ref >60)
Glucose, Bld: 146 mg/dL — ABNORMAL HIGH (ref 70–99)
Potassium: 4.3 mmol/L (ref 3.5–5.1)
Sodium: 138 mmol/L (ref 135–145)
Total Bilirubin: 1.7 mg/dL — ABNORMAL HIGH (ref 0.3–1.2)
Total Protein: 6.6 g/dL (ref 6.5–8.1)

## 2021-11-05 LAB — GLUCOSE, CAPILLARY: Glucose-Capillary: 188 mg/dL — ABNORMAL HIGH (ref 70–99)

## 2021-11-05 LAB — CBC
HCT: 52 % (ref 39.0–52.0)
Hemoglobin: 17 g/dL (ref 13.0–17.0)
MCH: 26.9 pg (ref 26.0–34.0)
MCHC: 32.7 g/dL (ref 30.0–36.0)
MCV: 82.4 fL (ref 80.0–100.0)
Platelets: 106 10*3/uL — ABNORMAL LOW (ref 150–400)
RBC: 6.31 MIL/uL — ABNORMAL HIGH (ref 4.22–5.81)
RDW: 14.6 % (ref 11.5–15.5)
WBC: 8.6 10*3/uL (ref 4.0–10.5)
nRBC: 0 % (ref 0.0–0.2)

## 2021-11-05 LAB — HIV ANTIBODY (ROUTINE TESTING W REFLEX): HIV Screen 4th Generation wRfx: NONREACTIVE

## 2021-11-05 LAB — LIPASE, BLOOD: Lipase: 123 U/L — ABNORMAL HIGH (ref 11–51)

## 2021-11-05 MED ORDER — HYDRALAZINE HCL 20 MG/ML IJ SOLN: 10.0000 mg | Freq: Four times a day (QID) | INTRAMUSCULAR | Status: DC | PRN

## 2021-11-05 NOTE — Progress Notes (Signed)
[facility] ? ?PROGRESS NOTE ? ?Brennan Litzinger Morozov OIB:[id number] DOB: [birth year] DOA: 11/04/2021 ?PCP: Nicoletta Dress, MD ? ? ?Brief HPI:   ?71 year old male with medical history of renal and liver transplant, hypertension, obstructive sleep apnea, diabetes mellitus type 2 presented with intractable nausea and vomiting.  Patient states that 3 days ago he went to dinner with his wife.  Shortly after he developed nausea and vomiting.  He has not been able to keep anything down.  Also complained of abdominal pain.  Came to ED for further evaluation. ? ?In the ED, CT abdomen/pelvis showed no acute findings in the abdomen or pelvis.  Postsurgical changes of right lower quadrant renal transplantation.  No hydronephrosis or perinephric fluid collection involving the transplanted kidney.  Decreased peritransplant inflammatory stranding in comparison to prior CT in September 2021.  Postsurgical changes of liver transplantation. ? ?Also found to have elevated lipase 265.  CT abdomen/pelvis showed no pancreatic inflammation. ? ? ? ?Subjective  ? ?Patient seen and examined, nausea and vomiting has improved.  Still has mild periumbilical pain. ? ? Assessment/Plan:  ? ? ?Intractable nausea and vomiting ?-Likely due to gastroenteritis ?-CT abdomen/pelvis unremarkable ?-Lipase was elevated to 65, repeat lipase this morning is down to 123 ?-Continue Zofran as needed for nausea vomiting ?-We will start clear liquid diet ?-Continue IV fluids ? ?Acute kidney injury ?-Patient baseline creatinine was 1.19 as of September 2021 ?-Presented with creatinine of 2.03 ?-Improved to 1.76 with IV fluids ?-Continue IV normal saline at 100 mL/h ?-If no improvement of creatinine, consider nephrology consultation in a.m. ? ?S/p renal liver transplant ?-Continue immunosuppressants including mycophenolate, prednisone, tacrolimus ? ?Diabetes mellitus type 2 ?-Continue sliding scale insulin with NovoLog ?-CBG well controlled ? ?Hypertension ?-Not on  any medications at home ?-Start hydralazine 10 mg IV every 6 hours as needed ? ?GERD ?-Continue Protonix ? ? ? ?Medications ? ?  ? aspirin  81 mg Oral Daily  ? insulin aspart  0-5 Units Subcutaneous QHS  ? insulin aspart  0-9 Units Subcutaneous TID WC  ? mycophenolate  500 mg Oral BID  ? pantoprazole  40 mg Oral QAC breakfast  ? predniSONE  5 mg Oral Q breakfast  ? pregabalin  100 mg Oral BID  ? sertraline  100 mg Oral Daily  ? tacrolimus ER  2 mg Oral QAC breakfast  ? traZODone  50 mg Oral QHS  ? ? ? Data Reviewed:  ? ?CBG: ? ?Recent Labs  ?Lab 11/04/21 ?1643 11/04/21 ?1840  ?GLUCAP 162* 174*  ? ? ?SpO2: 98 %  ? ? ?Vitals:  ? 11/04/21 1847 11/04/21 2201 11/05/21 9450 11/05/21 0606  ?BP: (!) 146/99 (!) 167/93 135/69 (!) 156/87  ?Pulse: 92 91 88 90  ?Resp: '16 18 20 15  '$ ?Temp: 98 ?F (36.7 ?C) 98.4 ?F (36.9 ?C) 98.3 ?F (36.8 ?C) 98 ?F (36.7 ?C)  ?TempSrc: Oral   Oral  ?SpO2: 99% 99% 97% 98%  ?Weight:      ?Height:      ? ? ? ? ?Data Reviewed: ? ?Basic Metabolic Panel: ?Recent Labs  ?Lab 11/04/21 ?1409 11/05/21 ?0518  ?NA 136 138  ?K 4.5 4.3  ?CL 98 104  ?CO2 23 19*  ?GLUCOSE 173* 146*  ?BUN 27* 26*  ?CREATININE 2.03* 1.76*  ?CALCIUM 9.4 8.5*  ? ? ?CBC: ?Recent Labs  ?Lab 11/04/21 ?1409 11/05/21 ?0518  ?WBC 6.6 8.6  ?NEUTROABS 4.7  --   ?HGB 17.8* 17.0  ?HCT 52.6* 52.0  ?  MCV 80.8 82.4  ?PLT 107* 106*  ? ? ?LFT ?Recent Labs  ?Lab 11/04/21 ?1409 11/05/21 ?0518  ?AST 21 18  ?ALT 37 28  ?ALKPHOS 97 81  ?BILITOT 1.8* 1.7*  ?PROT 7.9 6.6  ?ALBUMIN 4.2 3.5  ? ?  ?Antibiotics: ?Anti-infectives (From admission, onward)  ? ? None  ? ?  ? ? ? ?DVT prophylaxis: SCDs ? ?Code Status: Full code ? ?Family Communication: No family at bedside ? ? ?CONSULTS  ? ? ?Objective  ? ? ?Physical Examination: ? ? ?General-appears in no acute distress ?Heart-S1-S2, regular, no murmur auscultated ?Lungs-clear to auscultation bilaterally, no wheezing or crackles auscultated ?Abdomen-soft, nontender, no organomegaly ?Extremities-no edema in the lower  extremities ?Neuro-alert, oriented x3, no focal deficit noted ? ?Status is: Inpatient: Intractable nausea and vomiting ? ? ? ?  ? ? ? ? ? ?Oswald Hillock ?  ?[facility] ?If 7PM-7AM, please contact night-coverage at [email or web address], ?Office  [phone] ? ? ?11/05/2021, 4:05 PM  LOS: 0 days  ? ? ? ? ? ? ? ? ? ? ?  ?

## 2021-11-06 DIAGNOSIS — Z940 Kidney transplant status: Secondary | ICD-10-CM | POA: Diagnosis not present

## 2021-11-06 DIAGNOSIS — K219 Gastro-esophageal reflux disease without esophagitis: Secondary | ICD-10-CM | POA: Diagnosis not present

## 2021-11-06 DIAGNOSIS — R112 Nausea with vomiting, unspecified: Secondary | ICD-10-CM | POA: Diagnosis not present

## 2021-11-06 DIAGNOSIS — N179 Acute kidney failure, unspecified: Secondary | ICD-10-CM | POA: Diagnosis not present

## 2021-11-06 LAB — COMPREHENSIVE METABOLIC PANEL
ALT: 22 U/L (ref 0–44)
AST: 10 U/L — ABNORMAL LOW (ref 15–41)
Albumin: 3.4 g/dL — ABNORMAL LOW (ref 3.5–5.0)
Alkaline Phosphatase: 70 U/L (ref 38–126)
Anion gap: 10 (ref 5–15)
BUN: 21 mg/dL (ref 8–23)
CO2: 21 mmol/L — ABNORMAL LOW (ref 22–32)
Calcium: 8.2 mg/dL — ABNORMAL LOW (ref 8.9–10.3)
Chloride: 107 mmol/L (ref 98–111)
Creatinine, Ser: 1.68 mg/dL — ABNORMAL HIGH (ref 0.61–1.24)
GFR, Estimated: 44 mL/min — ABNORMAL LOW (ref >60)
Glucose, Bld: 146 mg/dL — ABNORMAL HIGH (ref 70–99)
Potassium: 3.8 mmol/L (ref 3.5–5.1)
Sodium: 138 mmol/L (ref 135–145)
Total Bilirubin: 1.8 mg/dL — ABNORMAL HIGH (ref 0.3–1.2)
Total Protein: 6.4 g/dL — ABNORMAL LOW (ref 6.5–8.1)

## 2021-11-06 LAB — GLUCOSE, CAPILLARY
Glucose-Capillary: 162 mg/dL — ABNORMAL HIGH (ref 70–99)
Glucose-Capillary: 153 mg/dL — ABNORMAL HIGH (ref 70–99)
Glucose-Capillary: 214 mg/dL — ABNORMAL HIGH (ref 70–99)
Glucose-Capillary: 190 mg/dL — ABNORMAL HIGH (ref 70–99)

## 2021-11-06 LAB — TACROLIMUS LEVEL: Tacrolimus (FK506) - LabCorp: 3.5 ng/mL (ref 2.0–20.0)

## 2021-11-06 MED ORDER — SODIUM CHLORIDE 0.9 % IV SOLN: INTRAVENOUS | Status: DC

## 2021-11-06 MED ORDER — INSULIN ASPART 100 UNIT/ML IJ SOLN
2.0000 [IU] | Freq: Three times a day (TID) | INTRAMUSCULAR | Status: DC
  Administered 2021-11-06 – 2021-11-07 (×4): 2 [IU] via SUBCUTANEOUS

## 2021-11-06 NOTE — Progress Notes (Addendum)
[facility] ?PROGRESS NOTE ? ? ? ?Progress Note  ?[name]  DGL:[id number] DOB: 19-May-1951 DOA: 11/04/2021 ?PCP: Nicoletta Dress, MD  ? ? ? ?Brief Narrative:  ? ?[name] is an 71 y.o. male past medical history of liver and renal transplant, essential hypertension, obstructive sleep apnea diabetes mellitus type 2 comes in with intractable nausea and vomiting, he also relates abdominal pain, CT of the abdomen pelvis showed no acute findings.  Lipase of 265  ? ? ? ?Assessment/Plan:  ? ?Intractable nausea and vomiting: ?Question viral gastroenteritis versus Ozempic side effect (he is on his third dose of Ozempic that was given on Friday), CT scan of the abdomen pelvis was unremarkable. ?Lipase was elevated on admission which is trending down, CT scan showed no inflammatory pancreatic changes. ?He was started on Zofran and a clear liquid diet along with IV fluids. ?His vomiting has resolved, has mild nausea he relates he has no appetite. ? ?Acute kidney injury ?Baseline creatinine around 1.1, on admission to likely prerenal azotemia started on fluid resuscitation his creatinine is nicely morning is 1.6 we will continue IV fluids for an additional 24 hours. ? ?History of liver and renal transplant: ?Continue immunosuppressive therapy mycophenolate, prednisone and problems. ? ?Diabetes mellitus type 2:  ?This fairly controlled, last hemoglobin A1c 7.4 continue sliding scale insulin. ? ?Elevated blood pressure without a diagnosis of hypertension: ?Continue IV as needed. ? ?GERD: ?Continue Protonix. ? ?Stage III CKD cubitus ulcer  ?RN Pressure Injury Documentation: ?Pressure Injury 03/10/20 Sacrum Medial Stage II -  Partial thickness loss of dermis presenting as a shallow open ulcer with a red, pink wound bed without slough. small area of non-intact skin in middle of sacrum surrounded by pink blanchable skin (Active)  ?03/10/20 1920  ?Location: Sacrum  ?Location Orientation: Medial  ?Staging: Stage II -   Partial thickness loss of dermis presenting as a shallow open ulcer with a red, pink wound bed without slough.  ?Wound Description (Comments): small area of non-intact skin in middle of sacrum surrounded by pink blanchable skin  ?Present on Admission: Yes  ?   ?Pressure Ulcer 09/14/15 Stage I -  Intact skin with non-blanchable redness of a localized area usually over a bony prominence. (Active)  ?09/14/15 2030  ?Location: Ischial tuberosity  ?Location Orientation: Left;Right  ?Staging: Stage I -  Intact skin with non-blanchable redness of a localized area usually over a bony prominence.  ?Wound Description (Comments):   ?Present on Admission: Yes  ? ? ?Estimated body mass index is 20.09 kg/m? as calculated from the following: ?  Height as of this encounter: '5\' 10"'$  (1.778 m). ?  Weight as of this encounter: 63.5 kg. ? ? ?DVT prophylaxis: lovenox ?Family Communication:none ?Status is: Inpatient ?Remains inpatient appropriate because: Intractable nausea vomiting ? ? ? ?Code Status:  ? ?  ?Code Status Orders  ?(From admission, onward)  ?  ? ? ?  ? ?  Start     Ordered  ? 11/04/21 1802  Do not attempt resuscitation (DNR)  Continuous       ?Question Answer Comment  ?In the event of cardiac or respiratory ARREST Do not call a ?code blue?   ?In the event of cardiac or respiratory ARREST Do not perform Intubation, CPR, defibrillation or ACLS   ?In the event of cardiac or respiratory ARREST Use medication by any route, position, wound care, and other measures to relive pain and suffering. May use oxygen, suction and manual treatment of airway  obstruction as needed for comfort.   ?Comments DNR confirmed with patient   ?  ? 11/04/21 1802  ? ?  ?  ? ?  ? ?Code Status History   ? ? Date Active Date Inactive Code Status Order ID Comments User Context  ? 03/10/2020 1258 03/17/2020 1847 Full Code [id number]  Jacky Kindle, MD ED  ? 09/14/2015 2030 09/21/2015 0309 DNR [id number]  Eugenie Filler, MD Inpatient  ? 09/06/2015 1723 09/10/2015  2226 DNR [id number]  Waldemar Dickens, MD ED  ? 08/27/2015 1947 08/31/2015 1507 DNR [id number]  Janece Canterbury, MD ED  ? 08/05/2015 0024 08/05/2015 1853 Full Code [id number]  Etta Quill, DO ED  ? 07/25/2015 2049 07/31/2015 1923 Full Code [id number]  Robbie Lis, MD Inpatient  ? 07/10/2015 2006 07/13/2015 1436 DNR [id number]  Domenic Polite, MD Inpatient  ? 04/26/2015 1838 04/28/2015 1622 DNR [id number]  Eugenie Filler, MD ED  ? 01/30/2015 1740 01/31/2015 1933 Full Code [id number]  Nita Sells, MD Inpatient  ? 10/25/2014 1824 11/11/2014 1426 Full Code [id number]  Bary Leriche, PA-C Inpatient  ? 10/20/2014 2303 10/25/2014 1824 Full Code [id number]  Jani Gravel, MD Inpatient  ? ?  ? ? ? ? ?IV Access:  ? ?Peripheral IV ? ? ?Procedures and diagnostic studies:  ? ?CT Renal Stone Study ? ?Result Date: 11/04/2021 ?CLINICAL DATA:  Flank pain, kidney stone suspected hx renal, liver transplant, AKI, elevated lipase EXAM: CT ABDOMEN AND PELVIS WITHOUT CONTRAST TECHNIQUE: Multidetector CT imaging of the abdomen and pelvis was performed following the standard protocol without IV contrast. RADIATION DOSE REDUCTION: This exam was performed according to the departmental dose-optimization program which includes automated exposure control, adjustment of the mA and/or kV according to patient size and/or use of iterative reconstruction technique. COMPARISON:  CT 03/10/2020, CT 08/16/2004 FINDINGS: Lower chest: There is a benign 4 mm nodule in the right lung base, unchanged since February 2006. Lingular atelectasis/scarring. No acute findings. Hepatobiliary: Postsurgical changes of liver transplantation. The gallbladder surgically absent. Pancreas: Unremarkable. No pancreatic ductal dilatation or surrounding inflammatory changes. Spleen: Splenomegaly. Adrenals/Urinary Tract: Adrenal glands are unremarkable. There is a right lower quadrant renal transplant without hydronephrosis or perinephric fluid collection. There is decreased  peritransplant stranding in comparison to prior CT in September 2021. There is bilateral native renal cortical thinning. There is no hydronephrosis or nephroureterolithiasis. There is an exophytic native right lower pole renal cyst with probable small mural calcification which measures 2.9 x 2.2 cm, previously 2.9 x 2.1 cm in September 2021. Stomach/Bowel: The stomach is within normal limits. There is no evidence of bowel obstruction.The appendix is normal. Moderate rectosigmoid stool burden. Vascular/Lymphatic: Mild aortoiliac atherosclerosis. No AAA. No lymphadenopathy. Reproductive: Unremarkable. Other: No abdominal wall hernia or abnormality. No abdominopelvic ascites. Musculoskeletal: No acute osseous abnormality. No suspicious lytic or blastic lesions. Multilevel degenerative changes of the spine. There is mild to moderate bilateral hip osteoarthritis. IMPRESSION: No acute findings in the abdomen or pelvis.  Normal appendix. Postsurgical changes of right lower quadrant renal transplantation. No hydronephrosis or perinephric fluid collection involving the transplant kidney. Decreased peritransplant inflammatory stranding in comparison to prior CT in September 2021. Postsurgical changes of liver transplantation. Electronically Signed   By: Maurine Simmering M.D.   On: 11/04/2021 15:49   ? ? ?Medical Consultants:  ? ?None. ? ? ?Subjective:  ? ? ?[name] relates he is hungry some intermittent nausea ? ?Objective:  ? ? ?Vitals:  ? 11/05/21 0606 11/05/21 1430 11/05/21  1920 11/06/21 0358  ?BP: (!) 156/87 (!) 161/77 (!) 154/91 (!) 154/91  ?Pulse: 90 91 88 88  ?Resp: '15 15 15 15  '$ ?Temp: 98 ?F (36.7 ?C) 98.4 ?F (36.9 ?C) 99.2 ?F (37.3 ?C) 99.9 ?F (37.7 ?C)  ?TempSrc: Oral Oral Oral Oral  ?SpO2: 98% 95% 95% 93%  ?Weight:      ?Height:      ? ?SpO2: 93 % ? ? ?Intake/Output Summary (Last 24 hours) at 11/06/2021 0959 ?Last data filed at 11/05/2021 1925 ?Gross per 24 hour  ?Intake --  ?Output 450 ml  ?Net -450 ml  ? ?Filed  Weights  ? 11/04/21 1255  ?Weight: 63.5 kg  ? ? ?Exam: ?General exam: In no acute distress. ?Respiratory system: Good air movement and clear to auscultation. ?Cardiovascular system: S1 & S2 heard, RRR. No JVD. ?[place]

## 2021-11-06 NOTE — TOC Initial Note (Signed)
Transition of Care (TOC) - Initial/Assessment Note  ? ? ?Patient Details  ?Name: [name] ?MRN: [id number] ?Date of Birth: October 19, 1950 ? ?Transition of Care (TOC) CM/SW Contact:    ?Tawanna Cooler, RN ?Phone Number: ?11/06/2021, 3:31 PM ? ?Clinical Narrative:                 ? ?Spoke with patient and his wife, [name], at the bedside.  [name] confirms that patient is receiving palliative services from [facility].  States he had [facility] services in the past, but not now.  Patient uses a wheelchair, does not walk.  [name] states they have a wheelchair Lucianne Lei for the patient.  She expressed concern about being able to get the patient into the [facility] when patient is discharged.  CM educated that [facility] can arrange a ride home with [facility] once patient is discharged, but there could be a copay, could not say for sure.  [name] verbalized understanding.   ?TOC following for discharge needs.  ? ? ?Expected Discharge Plan: Home/Self Care ?Barriers to Discharge: Continued Medical Work up ? ? ?Expected Discharge Plan and Services ?Expected Discharge Plan: Home/Self Care ?  ?  ?  ?Living arrangements for the past 2 months: [place] ?                ?  ?Prior Living Arrangements/Services ?Living arrangements for the past 2 months: [place] ?Lives with:: Spouse ?Patient language and need for interpreter reviewed:: Yes ?       ?Need for Family Participation in Patient Care: Yes (Comment) ?Care giver support system in place?: Yes (comment) ?  ?Criminal Activity/Legal Involvement Pertinent to Current Situation/Hospitalization: No - Comment as needed ? ?Emotional Assessment ?  ?Alcohol / Substance Use: Not Applicable ?Psych Involvement: No (comment) ? ?Admission diagnosis:  AKI (acute kidney injury) ([place]) [N17.9] ?Intractable nausea and vomiting [R11.2] ?Patient Active Problem List  ? Diagnosis Date Noted  ? AKI (acute kidney injury) ([place]) 11/04/2021  ? Intractable nausea and vomiting 11/04/2021  ? GERD  (gastroesophageal reflux disease) 11/04/2021  ? E. coli UTI (urinary tract infection)   ? Ureteral stone with hydronephrosis   ? COVID-19   ? Renal transplant recipient 03/12/2020  ? Liver transplanted ([place]) 03/12/2020  ? Sepsis secondary to UTI ([place]) 03/12/2020  ? Bacteremia due to Gram-negative bacteria 03/12/2020  ? Septic shock ([place]) 03/10/2020  ? Bilateral carpal tunnel syndrome 01/27/2018  ? Secondary esophageal varices without bleeding ([place])   ? Discitis of cervical region   ? Severe protein-calorie malnutrition ([place])   ? Hypokalemia   ? Pancytopenia ([place]) 09/14/2015  ? Acidosis, metabolic 62/70/3500  ? Pressure ulcer 09/09/2015  ? Elevated lactic acid level   ? Palliative care encounter 08/28/2015  ? Hepatic encephalopathy ([place]) 08/27/2015  ? 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 Myelitis ([place]) 08/13/2015  ? Abscess in epidural space of cervical spine 08/13/2015  ? Secondary biliary cirrhosis ([place])   ? Protein-calorie malnutrition, severe 07/26/2015  ? Ascites 07/25/2015  ? Acute renal failure superimposed on stage 3 chronic kidney disease ([place]) 07/25/2015  ? Anemia of chronic disease 07/25/2015  ? Depression 07/25/2015  ? Controlled type 2 diabetes mellitus with diabetic nephropathy, without long-term current use of insulin ([place]) 07/25/2015  ? Diabetic neuropathy ([place]) 07/25/2015  ? Coagulopathy ([place]) 07/25/2015  ? NASH (nonalcoholic steatohepatitis) 07/25/2015  ? Body mass index (BMI) of 26.0-26.9 in adult 05/10/2015  ? Other ascites 04/25/2015  ? Acute hepatic  encephalopathy ([place]) 01/30/2015  ? Thrombocytopenia ([place]) 10/22/2014  ? Bleeding esophageal varices ([place]) 10/27/2012  ? Hepatic cirrhosis ([place]) 07/12/2012  ? Obesity, unspecified 07/12/2012  ? Portal hypertension ([place]) 07/12/2012  ? Type 2 diabetes mellitus ([place]) 07/12/2012  ? ESOPHAGEAL STRICTURE 09/23/2004  ? ?PCP:  Nicoletta Dress, MD ?Pharmacy:   ?[facility] [place] ?[address] ?[place] [address] ?Phone: [phone] Fax: [phone] ? ? ? ?Readmission Risk Interventions ? ?  03/13/2020  ? 12:19 PM  ?Readmission Risk Prevention Plan  ?Transportation Screening Complete  ?PCP or Specialist Appt within 3-5 Days Complete  ?[place] or Home Care Consult Complete  ?Social Work Consult for [place] Planning/Counseling Complete  ?Palliative Care Screening Not Applicable  ?Medication Review Press photographer) Referral to Pharmacy  ? ? ? ?

## 2021-11-07 ENCOUNTER — Encounter (HOSPITAL_COMMUNITY): Payer: Self-pay | Admitting: Family Medicine

## 2021-11-07 DIAGNOSIS — N179 Acute kidney failure, unspecified: Secondary | ICD-10-CM | POA: Diagnosis not present

## 2021-11-07 DIAGNOSIS — R112 Nausea with vomiting, unspecified: Secondary | ICD-10-CM | POA: Diagnosis not present

## 2021-11-07 DIAGNOSIS — Z940 Kidney transplant status: Secondary | ICD-10-CM | POA: Diagnosis not present

## 2021-11-07 DIAGNOSIS — K219 Gastro-esophageal reflux disease without esophagitis: Secondary | ICD-10-CM | POA: Diagnosis not present

## 2021-11-07 LAB — GASTROINTESTINAL PANEL BY PCR, STOOL (REPLACES STOOL CULTURE)

## 2021-11-07 LAB — BASIC METABOLIC PANEL
Anion gap: 5 (ref 5–15)
BUN: 15 mg/dL (ref 8–23)
CO2: 22 mmol/L (ref 22–32)
Calcium: 8.1 mg/dL — ABNORMAL LOW (ref 8.9–10.3)
Chloride: 109 mmol/L (ref 98–111)
Creatinine, Ser: 1.37 mg/dL — ABNORMAL HIGH (ref 0.61–1.24)
GFR, Estimated: 56 mL/min — ABNORMAL LOW (ref >60)
Glucose, Bld: 149 mg/dL — ABNORMAL HIGH (ref 70–99)
Potassium: 3.3 mmol/L — ABNORMAL LOW (ref 3.5–5.1)
Sodium: 136 mmol/L (ref 135–145)

## 2021-11-07 LAB — GLUCOSE, CAPILLARY
Glucose-Capillary: 180 mg/dL — ABNORMAL HIGH (ref 70–99)
Glucose-Capillary: 158 mg/dL — ABNORMAL HIGH (ref 70–99)
Glucose-Capillary: 148 mg/dL — ABNORMAL HIGH (ref 70–99)
Glucose-Capillary: 178 mg/dL — ABNORMAL HIGH (ref 70–99)
Glucose-Capillary: 133 mg/dL — ABNORMAL HIGH (ref 70–99)
Glucose-Capillary: 182 mg/dL — ABNORMAL HIGH (ref 70–99)

## 2021-11-07 MED ORDER — POTASSIUM CHLORIDE CRYS ER 20 MEQ PO TBCR
40.0000 meq | EXTENDED_RELEASE_TABLET | Freq: Three times a day (TID) | ORAL | Status: AC
  Administered 2021-11-07: 40 meq via ORAL
  Filled 2021-11-07: qty 2

## 2021-11-07 MED ORDER — SODIUM CHLORIDE 0.9 % IV SOLN: INTRAVENOUS | Status: DC

## 2021-11-07 NOTE — Discharge Summary (Signed)
Physician Discharge Summary  ?[name] [id number] DOB: Sep 10, 1950 DOA: 11/04/2021 ? ?PCP: Nicoletta Dress, MD ? ?Admit date: 11/04/2021 ?Discharge date: 11/07/2021 ? ?Admitted From: Home ?Disposition:  Home ? ?Recommendations for Outpatient Follow-up:  ?Follow up with PCP in 1-2 weeks ?Please obtain BMP/CBC in one week ? ? ?Home Health:No ?Equipment/Devices:None ? ?Discharge Condition:Stable ?CODE STATUS:Full ?Diet recommendation: Heart Healthy ? ?Brief/Interim Summary: ?71 y.o. male past medical history of liver and renal transplant, essential hypertension, obstructive sleep apnea diabetes mellitus type 2 comes in with intractable nausea and vomiting, he also relates abdominal pain, CT of the abdomen pelvis showed no acute findings.  Lipase of 265  ? ?Discharge Diagnoses:  ?Principal Problem: ?  AKI (acute kidney injury) ([place]) ?Active Problems: ?  Depression ?  Type 2 diabetes mellitus ([place]) ?  Renal transplant recipient ?  Liver transplanted [facility]) ?  Intractable nausea and vomiting ?  GERD (gastroesophageal reflux disease) ? ?Acute kidney injury in the setting of nausea and vomiting/intractable nausea vomiting: ?Question viral gastroenteritis versus simply a side effect (he raises his third dose of Ozempic that was given on Friday when his nausea started) CT scan of the abdomen pelvis unremarkable. ?Lipase elevated on admission which is now down. ?CT scan did not show any inflammatory changes of the pancreas. ?He was given Zofran clear liquid diet IV fluids and he tolerated his diet vomiting resolved . ?The cause of his acute kidney injury is likely prerenal azotemia which resolved with fluid resuscitation. ? ?Acute kidney injury: ?With a baseline creatinine 1.1 on admission of 2 he was started on IV fluids and improved to 1.6 likely prerenal azotemia. ? ?History of liver and renal transplant: ?No changes made to his immunosuppressive therapy continue current regimen. ? ?Diabetes mellitus type 2: ?Fairly  controlled last hemoglobin A1c 7.4 continue current home regimen. ? ?Elevated blood pressure without a diagnosis of hypertension: ?Noted. ? ?GERD: ?Continue Protonix. ? ?Stage III sacral decubitus ulcer present on admission ? ?Discharge Instructions ? ?Discharge Instructions   ? ? Diet - low sodium heart healthy   Complete by: As directed ?  ? Increase activity slowly   Complete by: As directed ?  ? ?  ? ?Allergies as of 11/07/2021   ?No Known Allergies ?  ? ?  ?Medication List  ?  ? ?TAKE these medications   ? ?acetaminophen 325 MG tablet ?Commonly known as: TYLENOL ?Take 650 mg by mouth every 8 (eight) hours as needed for mild pain or headache. ?  ?albuterol 108 (90 Base) MCG/ACT inhaler ?Commonly known as: VENTOLIN HFA ?Inhale 2 puffs into the lungs every 6 (six) hours as needed for wheezing or shortness of breath. ?  ?aspirin 81 MG chewable tablet ?Chew 81 mg by mouth daily. ?  ?azelastine 0.05 % ophthalmic solution ?Commonly known as: OPTIVAR ?Place 1 drop into both eyes 2 (two) times daily as needed (for watery eyes). ?  ?Envarsus XR 1 MG Tb24 ?Generic drug: tacrolimus ER ?Take 2 mg by mouth daily before breakfast. ?  ?ferrous sulfate 325 (65 FE) MG tablet ?Take 325 mg by mouth daily with breakfast. ?  ?FreeStyle Libre 14 Day Sensor Misc ?Inject 1 Device into the skin every 14 (fourteen) days. ?  ?HumaLOG Mix 75/25 KwikPen (75-25) 100 UNIT/ML Kwikpen ?Generic drug: Insulin Lispro Prot & Lispro ?Inject 30 Units into the skin 2 (two) times daily before a meal. ?  ?mycophenolate 500 MG tablet ?Commonly known as: CELLCEPT ?Take 500 mg by mouth 2 (two)  times daily. ?  ?ondansetron 4 MG disintegrating tablet ?Commonly known as: ZOFRAN-ODT ?Take 4 mg by mouth every 8 (eight) hours as needed for nausea or vomiting (dissolve orally). ?  ?Ozempic (0.25 or 0.5 MG/DOSE) 2 MG/3ML Sopn ?Generic drug: Semaglutide(0.25 or 0.'5MG'$ /DOS) ?Inject 0.25 mg into the skin every Friday. ?  ?pantoprazole 40 MG tablet ?Commonly known as:  PROTONIX ?Take 40 mg by mouth daily before breakfast. ?  ?polyethylene glycol powder 17 GM/SCOOP powder ?Commonly known as: GLYCOLAX/MIRALAX ?Take 17 g by mouth daily as needed for mild constipation (mix and drink). ?  ?predniSONE 5 MG tablet ?Commonly known as: DELTASONE ?Take 5 mg by mouth daily with breakfast. ?  ?pregabalin 100 MG capsule ?Commonly known as: LYRICA ?Take 100 mg by mouth 2 (two) times daily. ?  ?pregabalin 50 MG capsule ?Commonly known as: LYRICA ?TAKE 1 CAPSULE BY MOUTH IN THE MORNING, TAKE 1 CAPSULE MIDDAY AND 2 IN THE EVENING ?  ?sertraline 100 MG tablet ?Commonly known as: ZOLOFT ?Take 100 mg by mouth daily. ?  ?traZODone 50 MG tablet ?Commonly known as: DESYREL ?Take 50 mg by mouth at bedtime. ?  ? ?  ? ? ?No Known Allergies ? ?Consultations: ?None ? ? ?Procedures/Studies: ?CT Renal Stone Study ? ?Result Date: 11/04/2021 ?CLINICAL DATA:  Flank pain, kidney stone suspected hx renal, liver transplant, AKI, elevated lipase EXAM: CT ABDOMEN AND PELVIS WITHOUT CONTRAST TECHNIQUE: Multidetector CT imaging of the abdomen and pelvis was performed following the standard protocol without IV contrast. RADIATION DOSE REDUCTION: This exam was performed according to the departmental dose-optimization program which includes automated exposure control, adjustment of the mA and/or kV according to patient size and/or use of iterative reconstruction technique. COMPARISON:  CT 03/10/2020, CT 08/16/2004 FINDINGS: Lower chest: There is a benign 4 mm nodule in the right lung base, unchanged since February 2006. Lingular atelectasis/scarring. No acute findings. Hepatobiliary: Postsurgical changes of liver transplantation. The gallbladder surgically absent. Pancreas: Unremarkable. No pancreatic ductal dilatation or surrounding inflammatory changes. Spleen: Splenomegaly. Adrenals/Urinary Tract: Adrenal glands are unremarkable. There is a right lower quadrant renal transplant without hydronephrosis or perinephric fluid  collection. There is decreased peritransplant stranding in comparison to prior CT in September 2021. There is bilateral native renal cortical thinning. There is no hydronephrosis or nephroureterolithiasis. There is an exophytic native right lower pole renal cyst with probable small mural calcification which measures 2.9 x 2.2 cm, previously 2.9 x 2.1 cm in September 2021. Stomach/Bowel: The stomach is within normal limits. There is no evidence of bowel obstruction.The appendix is normal. Moderate rectosigmoid stool burden. Vascular/Lymphatic: Mild aortoiliac atherosclerosis. No AAA. No lymphadenopathy. Reproductive: Unremarkable. Other: No abdominal wall hernia or abnormality. No abdominopelvic ascites. Musculoskeletal: No acute osseous abnormality. No suspicious lytic or blastic lesions. Multilevel degenerative changes of the spine. There is mild to moderate bilateral hip osteoarthritis. IMPRESSION: No acute findings in the abdomen or pelvis.  Normal appendix. Postsurgical changes of right lower quadrant renal transplantation. No hydronephrosis or perinephric fluid collection involving the transplant kidney. Decreased peritransplant inflammatory stranding in comparison to prior CT in September 2021. Postsurgical changes of liver transplantation. Electronically Signed   By: Maurine Simmering M.D.   On: 11/04/2021 15:49   ?(Echo, Carotid, EGD, Colonoscopy, ERCP)  ? ? ?Subjective: ?No complaints ? ?Discharge Exam: ?Vitals:  ? 11/06/21 2010 11/07/21 0307  ?BP: 139/70 (!) 145/69  ?Pulse: 74 61  ?Resp: 15 15  ?Temp: 98.2 ?F (36.8 ?C) (!) 97.3 ?F (36.3 ?C)  ?SpO2: 99% 98%  ? ?  Vitals:  ? 11/06/21 0358 11/06/21 1333 11/06/21 2010 11/07/21 0307  ?BP: (!) 154/91 (!) 156/78 139/70 (!) 145/69  ?Pulse: 88 85 74 61  ?Resp: '15 18 15 15  '$ ?Temp: 99.9 ?F (37.7 ?C) 98.4 ?F (36.9 ?C) 98.2 ?F (36.8 ?C) (!) 97.3 ?F (36.3 ?C)  ?TempSrc: Oral Oral Oral Oral  ?SpO2: 93% 95% 99% 98%  ?Weight:      ?Height:      ? ? ?General: Pt is alert, awake,  not in acute distress ?Cardiovascular: RRR, S1/S2 +, no rubs, no gallops ?Respiratory: CTA bilaterally, no wheezing, no rhonchi ?Abdominal: Soft, NT, ND, bowel sounds + ?Extremities: no edema, no cyanosis ? ?

## 2021-11-11 LAB — MYCOPHENOLIC ACID (CELLCEPT)
MPA Glucuronide: 38 ug/mL (ref 15–125)
MPA: 1.5 ug/mL (ref 1.0–3.5)

## 2022-06-12 LAB — HEPATIC FUNCTION PANEL
ALT: 20 U/L (ref 10–40)
AST: 17 (ref 14–40)
Alkaline Phosphatase: 80 (ref 25–125)
Bilirubin, Total: 0.7

## 2022-06-12 LAB — BASIC METABOLIC PANEL
BUN: 23 — AB (ref 4–21)
CO2: 26 — AB (ref 13–22)
Chloride: 102 (ref 99–108)
Creatinine: 0.8 (ref 0.6–1.3)
Glucose: 228
Potassium: 4 mEq/L (ref 3.5–5.1)
Sodium: 135 — AB (ref 137–147)

## 2022-06-12 LAB — CBC AND DIFFERENTIAL
HCT: 49 (ref 41–53)
Hemoglobin: 16.6 (ref 13.5–17.5)
LYMPH%: 9 %
MCV: 83 (ref 80–94)
MONO%: 7 %
NEUT%: 84 %
Neutrophils Absolute: 6.56
Platelets: 128 10*3/uL — AB (ref 150–400)
WBC: 7.9

## 2022-06-12 LAB — CBC: RBC: 5.85 — AB (ref 3.87–5.11)

## 2022-06-12 LAB — COMPREHENSIVE METABOLIC PANEL
Albumin: 3.6 (ref 3.5–5.0)
Calcium: 8.9 (ref 8.7–10.7)

## 2022-06-13 ENCOUNTER — Other Ambulatory Visit: Payer: Self-pay | Admitting: Hematology and Oncology

## 2022-07-08 DIAGNOSIS — I517 Cardiomegaly: Secondary | ICD-10-CM

## 2022-12-31 ENCOUNTER — Encounter: Payer: Self-pay | Admitting: Internal Medicine

## 2023-01-16 ENCOUNTER — Other Ambulatory Visit: Payer: Self-pay

## 2023-01-16 ENCOUNTER — Emergency Department (HOSPITAL_COMMUNITY)
Admission: EM | Admit: 2023-01-16 | Discharge: 2023-01-16 | Disposition: A | Discharge status: Home / Self Care | Payer: Medicare Other | Attending: Emergency Medicine | Admitting: Emergency Medicine

## 2023-01-16 DIAGNOSIS — R310 Gross hematuria: Secondary | ICD-10-CM

## 2023-01-16 DIAGNOSIS — R319 Hematuria, unspecified: Secondary | ICD-10-CM | POA: Insufficient documentation

## 2023-01-16 DIAGNOSIS — Z7982 Long term (current) use of aspirin: Secondary | ICD-10-CM | POA: Insufficient documentation

## 2023-01-16 DIAGNOSIS — Z794 Long term (current) use of insulin: Secondary | ICD-10-CM | POA: Diagnosis not present

## 2023-01-16 LAB — URINALYSIS, ROUTINE W REFLEX MICROSCOPIC
RBC / HPF: >50 RBC/hpf (ref 0–5)
WBC, UA: >50 WBC/hpf (ref 0–5)

## 2023-01-16 LAB — CBC WITH DIFFERENTIAL/PLATELET
Abs Immature Granulocytes: 0.06 10*3/uL (ref 0.00–0.07)
Basophils Absolute: 0 10*3/uL (ref 0.0–0.1)
Basophils Relative: 0 %
Eosinophils Absolute: 0.2 10*3/uL (ref 0.0–0.5)
Eosinophils Relative: 3 %
HCT: 46 % (ref 39.0–52.0)
Hemoglobin: 15.4 g/dL (ref 13.0–17.0)
Immature Granulocytes: 1 %
Lymphocytes Relative: 14 %
Lymphs Abs: 1 10*3/uL (ref 0.7–4.0)
MCH: 28.5 pg (ref 26.0–34.0)
MCHC: 33.5 g/dL (ref 30.0–36.0)
MCV: 85.2 fL (ref 80.0–100.0)
Monocytes Absolute: 0.7 10*3/uL (ref 0.1–1.0)
Monocytes Relative: 10 %
Neutro Abs: 5 10*3/uL (ref 1.7–7.7)
Neutrophils Relative %: 72 %
Platelets: 134 10*3/uL — ABNORMAL LOW (ref 150–400)
RBC: 5.4 MIL/uL (ref 4.22–5.81)
RDW: 15.2 % (ref 11.5–15.5)
WBC: 6.9 10*3/uL (ref 4.0–10.5)
nRBC: 0 % (ref 0.0–0.2)

## 2023-01-16 LAB — BASIC METABOLIC PANEL
Anion gap: 9 (ref 5–15)
BUN: 20 mg/dL (ref 8–23)
CO2: 28 mmol/L (ref 22–32)
Calcium: 8.9 mg/dL (ref 8.9–10.3)
Chloride: 98 mmol/L (ref 98–111)
Creatinine, Ser: 0.91 mg/dL (ref 0.61–1.24)
GFR, Estimated: >60 mL/min (ref >60)
Glucose, Bld: 269 mg/dL — ABNORMAL HIGH (ref 70–99)
Potassium: 3.9 mmol/L (ref 3.5–5.1)
Sodium: 135 mmol/L (ref 135–145)

## 2023-01-16 MED ORDER — SODIUM CHLORIDE 0.9 % IV BOLUS
500.0000 mL | Freq: Once | INTRAVENOUS | Status: AC
  Administered 2023-01-16: 500 mL via INTRAVENOUS

## 2023-01-16 NOTE — ED Triage Notes (Signed)
Pt states he has been "peeing blood" since last night. Had in and out cath done yesterday

## 2023-01-16 NOTE — Discharge Instructions (Signed)
As we discussed, the blood in your urine is likely due to being cathed yesterday.  This should resolve on its own.  If it continues or you are suddenly unable to pee you will need to return for additional evaluation.  Please call your doctor and your urology team to schedule follow-up appointment.  Return if development of any new or worsening symptoms.

## 2023-01-16 NOTE — ED Provider Notes (Signed)
[place] EMERGENCY DEPARTMENT AT [facility] Provider Note   CSN: [id number] Arrival date & time: 01/16/23  1244     History  Chief Complaint  Patient presents with   Hematuria    [name] is a 72 y.o. male status post kidney transplant presented with hematuria for the past day.  Patient was seen at [facility] and admitted and discharged on 01/14/2023.  Patient was at [facility] yesterday and had an hour.  Since Then Patient Has Been Urinating Blood.  According to Patient's Wife Patient Yesterday Urinated 203 100 mL after Getting Cath and Has Not Urinated Today.  Patient Denies Any Fevers, Nauseous Vomiting, Chest Pain, Shortness of Breath, Abdominal Pain, Change Sensation/Motor Skills, Skin Color Changes, Flank Pain, dysuria.  Patient's wife states that [facility] called him and told him to go to the ER to be evaluated for the hematuria.    Home Medications Prior to Admission medications   Medication Sig Start Date End Date Taking? Authorizing Provider  acetaminophen (TYLENOL) 325 MG tablet Take 650 mg by mouth every 8 (eight) hours as needed for mild pain or headache.  11/20/15   [provider]  albuterol (VENTOLIN HFA) 108 (90 Base) MCG/ACT inhaler Inhale 2 puffs into the lungs every 6 (six) hours as needed for wheezing or shortness of breath. 03/17/20   Leroy Sea, MD  aspirin 81 MG chewable tablet Chew 81 mg by mouth daily.    [provider]  azelastine (OPTIVAR) 0.05 % ophthalmic solution Place 1 drop into both eyes 2 (two) times daily as needed (for watery eyes).    [provider]  Continuous Blood Gluc Sensor (FREESTYLE LIBRE 14 DAY SENSOR) MISC Inject 1 Device into the skin every 14 (fourteen) days.    [provider]  ENVARSUS XR 1 MG TB24 Take 2 mg by mouth daily before breakfast.    [provider]  ferrous sulfate 325 (65 FE) MG tablet Take 325 mg by mouth daily with breakfast.    [provider]  HUMALOG MIX  75/25 KWIKPEN (75-25) 100 UNIT/ML KwikPen Inject 30 Units into the skin 2 (two) times daily before a meal.    [provider]  mycophenolate (CELLCEPT) 500 MG tablet Take 500 mg by mouth 2 (two) times daily.    [provider]  ondansetron (ZOFRAN-ODT) 4 MG disintegrating tablet Take 4 mg by mouth every 8 (eight) hours as needed for nausea or vomiting (dissolve orally).    [provider]  pantoprazole (PROTONIX) 40 MG tablet Take 40 mg by mouth daily before breakfast.    [provider]  polyethylene glycol powder (GLYCOLAX/MIRALAX) 17 GM/SCOOP powder Take 17 g by mouth daily as needed for mild constipation (mix and drink).    [provider]  predniSONE (DELTASONE) 5 MG tablet Take 5 mg by mouth daily with breakfast. 01/01/16   [provider]  pregabalin (LYRICA) 100 MG capsule Take 100 mg by mouth 2 (two) times daily.    [provider]  pregabalin (LYRICA) 50 MG capsule TAKE 1 CAPSULE BY MOUTH IN THE MORNING, TAKE 1 CAPSULE MIDDAY AND 2 IN THE EVENING Patient not taking: Reported on 11/04/2021 01/26/19   Glean Salvo, NP  Semaglutide,0.25 or 0.5MG /DOS, (OZEMPIC, 0.25 OR 0.5 MG/DOSE,) 2 MG/3ML SOPN Inject 0.25 mg into the skin every Friday.    [provider]  sertraline (ZOLOFT) 100 MG tablet Take 100 mg by mouth daily.    [provider]  traZODone (DESYREL) 50 MG tablet Take 50 mg by mouth at bedtime. 02/10/20   [provider]      Allergies    Patient has no known allergies.    Review of Systems   Review of Systems  Genitourinary:  Positive for hematuria.    Physical Exam Updated Vital Signs BP 133/86 (BP Location: Right Arm)   Pulse 88   Temp 98.7 F (37.1 C) (Oral)   Resp 16   Ht 5\' 10"  (1.778 m)   Wt 64 kg   SpO2 95%   BMI 20.24 kg/m  Physical Exam Vitals reviewed.  Constitutional:      General: He is not in acute distress. HENT:     Head: Normocephalic and atraumatic.  Eyes:      Extraocular Movements: Extraocular movements intact.     Conjunctiva/sclera: Conjunctivae normal.     Pupils: Pupils are equal, round, and reactive to light.  Cardiovascular:     Rate and Rhythm: Normal rate and regular rhythm.     Pulses: Normal pulses.     Heart sounds: Normal heart sounds.     Comments: 2+ bilateral radial/dorsalis pedis pulses with regular rate Pulmonary:     Effort: Pulmonary effort is normal. No respiratory distress.     Breath sounds: Normal breath sounds.  Abdominal:     Palpations: Abdomen is soft.     Tenderness: There is no abdominal tenderness. There is no guarding or rebound.  Musculoskeletal:        General: Normal range of motion.     Cervical back: Normal range of motion and neck supple.     Comments: 5 out of 5 bilateral grip/leg extension strength  Skin:    General: Skin is warm and dry.     Capillary Refill: Capillary refill takes less than 2 seconds.  Neurological:     General: No focal deficit present.     Mental Status: He is alert and oriented to person, place, and time.     Comments: Sensation intact in all 4 limbs  Psychiatric:        Mood and Affect: Mood normal.     ED Results / Procedures / Treatments   Labs (all labs ordered are listed, but only abnormal results are displayed) Labs Reviewed  URINALYSIS, ROUTINE W REFLEX MICROSCOPIC    EKG None  Radiology No results found.  Procedures Procedures    Medications Ordered in ED Medications - No data to display  ED Course/ Medical Decision Making/ A&P                             Medical Decision Making Amount and/or Complexity of Data Reviewed Labs: ordered.   Nivaan Fuhr Mayeda 72 y.o. presented today for hematuria. Working DDx that I considered at this time includes, but not limited to, secondary to catheter use, nephrolithiasis, bladder cancer, GIB, hemorrhagic kidney, dehydration, electrolyte abnormalities, CKD/AKI.  R/o DDx: pending  Review of prior external  notes: 01/16/2023 patient out reach telephone  Unique Tests and My Interpretation:  BMP: pending CBC: unremarkable UA: pending Bladder scan: pending  Discussion with Independent Historian:  Wife  Discussion of Management of Tests: None  Risk: pending  Risk Stratification Score: None  Staffed with Particia Nearing, MD  Plan: On exam patient was in no acute distress stable vitals.  Patient had no abdominal tenderness is endorsing any systemic symptoms outside of the hematuria from his In-N-Out catheter yesterday.  Wife initially stated the patient only urinated yesterday 203 100 mL each time and did not urinate today however on recheck patient patient's wife stated that he had in fact urinated twice today and that she sees specks of blood in the urine.  At this time I highly suspect patient's hematuria secondary to having in and out catheter as patient is clinically well and his physical exam is unremarkable.  Basic labs will be drawn to ensure patient does not have an AKI or anemia or other complications along with a bladder scan.  Patient does have urology follow-up at the end of this month.  Patient stable at this time.  Patient signed out to Lurena Nida, PA-C. The plan moving forward is to follow up on labs and bladder scan and if reassuring may be discharged as he has urology follow up later this month.         Final Clinical Impression(s) / ED Diagnoses Final diagnoses:  None    Rx / DC Orders ED Discharge Orders     None         Remi Deter 01/16/23 1513    Jacalyn Lefevre, MD 01/16/23 [phone]

## 2023-01-16 NOTE — ED Provider Notes (Signed)
Care assumed from [facility], PA-C at shift change. Please see their note for further information.   Briefly: Patient with hx kidney transplant presents with hematuria. Was cathed yesterday at [facility] and since then has been urinating blood. No belly pain, flank pain, or other symptoms. Called [facility] and they sent him here for evaluation.   Plan: bladder scan, labs, UA. If able to void and no signs or urinary retention, should be able to go with close follow-up. Hematuria likely due to injury from catheterization yesterday.  Bladder scan with 190 cc, UA with blood.  Discussed with patient, he continues to deny any belly pain.  He is able to void without issue.  Therefore recommend discharge with close outpatient follow-up and monitoring per previous providers recommendations. Evaluation and diagnostic testing in the emergency department does not suggest an emergent condition requiring admission or immediate intervention beyond what has been performed at this time.  Plan for discharge with close PCP follow-up.  Patient is understanding and amenable with plan, educated on red flag symptoms that would prompt immediate return.  Patient discharged in stable condition.    Vear Clock 01/16/23 1728    Charlynne Pander, MD 01/16/23 [phone]

## 2023-01-16 NOTE — ED Notes (Signed)
Patient's depend changed and pericare done.

## 2023-12-11 NOTE — Progress Notes (Signed)
 Hospital Medicine Progress Note   Hospital Day: 3 with principal problem of Complicated urinary tract infection  Subjective:   Interval History: [name] is seen and examined.  No acute overnight events Electrolytes monitored closely No fevers UCx pending   Doing better today No abd pain Breathing is good No chest pain, no fevers.  Low bs this morning (65). Improved with breakfast but was able to eat a little more this morning  Slept well    Objective:   Vital signs in last 24 hours: Temp:  [36.4 C (97.6 F)-36.6 C (97.8 F)] 36.4 C (97.6 F) Heart Rate:  [40-53] 46 Resp:  [12-15] 15 BP: (136-177)/(66-88) 152/88 Temp (24hrs), Avg:36.5 C (97.7 F), Min:36.4 C (97.6 F), Max:36.6 C (97.8 F)  SpO2: 100 % Weight: 90.1 kg (198 lb 10.2 oz)  Physical Exam: General: chronically ill appearing man, resting in bed, wife at bedside  Eyes: conjunctiva clear, anicteric sclera HENT: oropharynx clear, moist mucous membranes, no thrush Cardiovascular: distant heart tones  Respiratory: breathing comfortably on ambient air. Lungs are clear to auscultation b/l, good air exchange Abdomen: +Bs. Continues to have lower abd tenderness. No rebound Extremities: no lower extremity edema, significant sarcopenia Skin: pale GU: foley catheter in place draining clear yellow urine  Psych: oriented x 3, conversant  Neuro:  able to move all ext upon request   Pertinent Recent Labs:  Recent Labs  Lab 12/09/23 2339 12/11/23 0550  WBC 9.5 5.0  HGB 15.4 14.1  HCT 44.6 42.1  PLT 127* 99*   Recent Labs  Lab 12/09/23 2339 12/10/23 0742 12/10/23 1701 12/11/23 0551  NA 139 138  136 135 138  K 3.9 2.9* 5.3* 4.6  CL 100 109* 105 104  CO2 22 16* 21 23  BUN 25* 19 25* 23*  CREATININE 1.8* 1.1 1.4* 1.6*  CALCIUM  9.7 6.1* 8.6* 9.1  ALKPHOS 68  --   --  62  ALT 22  --   --  26  AST 27  --   --  22  GLUCOSE 80 124 175* 103   Recent Labs  Lab 12/10/23 0742 12/10/23 1701  12/11/23 0551  MG 1.3* 2.5 2.6*     Recent Labs  Lab 12/10/23 0258  COLORU Light Yellow  CLARITYU Cloudy*  SPECGRAV 1.044*  LABPH 6.5  PROTEINUA Trace*  GLUCOSEU Negative  KETONESU Trace*  BLOODU 1+*  NITRITE Negative  LEUKOCYTESUR 3+*  BILIRUBINUR Negative  UROBILINOGEN 0.2  RBCUA 42*  WBCUA >182*  SQUAMEPI 0  HYALINE 0    CT abd/pelvis  Impression: 1.  Thick-walled bladder which may be seen in the setting of cystitis. Correlate with urinalysis. 2.  No other cause for abdominal pain identified.   Medication list reviewed; critical medications include: Scheduled APAP Apixaban Atorvastatin Insulin  Prednisone  Tamsulosin  Tacrolimus   piptazo  Scheduled Meds: . acetaminophen   975 mg Oral TID  . apixaban  5 mg Oral Q12H SCH  . atorvastatin  20 mg Oral Daily  . cholecalciferol  2,000 Units Oral Daily  . finasteride  5 mg Oral Daily  . insulin  GLARGINE-yfgn (SEMGLEE ) injection  20 Units Subcutaneous QHS  . insulin  LISPRO (AdmeLOG, HumaLOG) injection  5 Units Subcutaneous Daily with breakfast  . insulin  LISPRO (AdmeLOG, HumaLOG) injection  5 Units Subcutaneous Daily with lunch  . insulin  LISPRO (AdmeLOG, HumaLOG) injection  5 Units Subcutaneous Daily with dinner  . insulin  LISPRO (AdmeLOG, HumaLOG) injection  0-12 Units Subcutaneous 4x Daily  . multivitamin  1 tablet Oral  Daily after lunch  . pantoprazole   40 mg Oral Daily  . piperacillin -tazobactam (ZOSYN ) IV Extended Therapy  3.375 g Intravenous Q8H  . polyethylene glycol  17 g Oral Daily  . predniSONE   5 mg Oral Daily  . pregabalin   100 mg Oral BID  . sennosides-docusate  2 tablet Oral BID  . sertraline   100 mg Oral Daily  . tacrolimus   1 mg Oral Daily  . tamsulosin   0.8 mg Oral Daily  . thiamine (Vitamin B-1) injection  200 mg Intravenous Daily   Followed by  . [START ON 12/13/2023] thiamine  100 mg Oral Daily   Continuous Infusions: PRN Meds:.dextrose  50% in water , glucagon, lidocaine , lidocaine ,  melatonin, traZODone   Assessment/Plan:   Principal Problem:   Complicated urinary tract infection Active Problems:   Type 2 diabetes mellitus, with long-term current use of insulin  (CMS/HHS-HCC)   Immunosuppression (CMS/HHS-HCC)   S/P liver transplant (CMS/HHS-HCC)   S/P kidney transplant (HHS-HCC)   Essential hypertension   Hypomagnesemia   Moderate OSA. PLMS w/ arousal. 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   UTI in transplanted kidney Chronic indwelling foley catheter 2/2 neurogenic bladder  Abdominal pain Presents with 3 days of abd pain, found to have abnormal UA and e/o cystitis on CT scan. Exam notable for lower abd pain without rebound. No other abd abnormalities identified on CT scan. Lactate 2.4 --> 1.1, no fevers, normal WBC. Received aggressive IVF resuscitation in the ED, and started on piptazo.  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 Received 2L IVF in ED with improvement in labs though slight worsening in function again today. FK level 5.9.  - continue prednisone  - continue tacrolimus  - transplant nephrology consultation  - follow labs closely   AGMA, improved  Significant electrolyte abnormalities with elevation in AG and HCO3 16. F/u labs with improvement.  - monitor labs closely   HypoMagnesium Hypokalemia Hypocalcemia Improved after aggressive  supplementation   Craniotomy and C1 laminectomy with cyst fenestration c/b cerebral hemorrhage and PICA stroke (2021) w/ residual bilateral lower extremity weakness Wheelchair bound Unchanged neuroexam per patient and wife. H/o constipation, with bowel movements around every 3 days. No evidence of bowel obstruction on imaging.  - continue MiraLAX , senna-docusate - plan pt/ot evaluations given some worsening of chronic weakness    IDDM T2 (A1c = 8) Follows with endocrine outpatient.  Not on SGLT2i given recurrent UTI nor GLP-1 agonist due to chronic pancreatitis.  On lispro-protamine regimen at home.  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  LFTs WNL on admission.  Lipase stably elevated.  No comment of pancreatitis on CT.  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)   Mood disorder - Continue sertraline , trazodone   VTE Prophylaxis  + Anticoagulant Ordered  apixaban, 5 mg, Oral, Q12H SCH, 5 mg at 12/10/23 2121      Code Status: DNAR   DISCHARGE PLANNING AND HOSPITAL FOLLOW-UP  Expected Discharge Date: 12/15/2023  Current Activity: Bedfast (12/10/23 2129) Current Mobility: Slightly limited (12/10/23 2129) Anticipated Discharge Destination: Home Anticipated Discharge Needs: none PT DC recommendation:    Future Appointments  Date Time Provider Department Center  01/05/2024  1:40 PM Bartholomew Willma Caldron, NP DRAHADV[facility]  01/28/2024  8:00 AM Mariam Philbert Carne, MD DSUROLOGY [facility]  01/28/2024 10:00 AM Smitty Cathryne Gibson, NP LIVERINTTP [facility]  01/28/2024 11:20 AM RENAL TRANSPLANT PROVIDER 1 [id number] [facility]    NOEL IVEY, MD 12/11/2023

## 2023-12-16 NOTE — Progress Notes (Signed)
 Transplant Infectious Diseases Progress Note   Service/Attending Requesting Consult: Mufuka, Bolanle Metiko, * (Internal Medicine)  Reason for consult: UTI Transplant Details: Solid Organ: 11/06/2015 (Liver), 11/06/2015 (Kidney)  HISTORY   INTERVAL EVENTS Afebrile. Hemodynamically stable. Eupneic on room air. Urine output in the last 24 hrs: 0.7 ml/kg/hr. Patient reported substantial improvement in graft tenderness. Wife not comfortable in administering iv antibiotics at home.    CURRENT ADMISSION 12/09/23: Admitted with bilateral lower abdominal pain and decreased appetite after foley exchange on 5/29. Elevated Cr at 1.8. Lactate elevated at 2.4. Urine culture grew MRSA. 12/11/23: Blood cultures negative to date 12/15/23: MRI spine obtained because of reported lumbar pain was negative for discitis or osteomyelitis   CURRENT ANTIMICROBIALS Vancomycin  12/11/23-present   CURRENT IMMUNOSUPPRESSION Prednisone  5 mg q24h Tacrolimus    Current Facility-Administered Medications  Medication Dose Route Frequency Provider Last Rate Last Admin  . acetaminophen  (TYLENOL ) tablet 975 mg  975 mg Oral TID Pia Angelita Redbird, MD   975 mg at 12/16/23 1530  . amLODIPine (NORVASC) tablet 5 mg  5 mg Oral Daily Mufuka, Bolanle Metiko, MD   5 mg at 12/16/23 0911  . apixaban (ELIQUIS) tablet 5 mg  5 mg Oral Q12H SCH Ongtengco, Angelita Redbird, MD   5 mg at 12/16/23 0911  . atorvastatin (LIPITOR) tablet 20 mg  20 mg Oral Daily Ongtengco, Angelita Redbird, MD   20 mg at 12/16/23 0911  . cholecalciferol (VITAMIN D3) tablet 2,000 Units  2,000 Units Oral Daily Ongtengco, Angelita Redbird, MD   2,000 Units at 12/16/23 0911  . dextrose  50% in water  solution 12.5-25 g  12.5-25 g Intravenous As Directed Ongtengco, Angelita Redbird, MD      . finasteride (PROSCAR) tablet 5 mg  5 mg Oral Daily Ongtengco, Angelita Redbird, MD   5 mg at 12/16/23 0911  . glucagon (GLUCAGEN) injection 1 mg  1 mg Subcutaneous As Directed Ongtengco, Angelita Redbird, MD      . insulin  GLARGINE-yfgn (SEMGLEE ) injection 40 Units  40 Units Subcutaneous QHS Mufuka, Bolanle Metiko, MD   40 Units at 12/15/23 2050  . insulin  LISPRO (AdmeLOG, HumaLOG) injection 15 Units  15 Units Subcutaneous TID CC Mufuka, Bolanle Metiko, MD   15 Units at 12/16/23 1158  . insulin  LISPRO (AdmeLOG, HumaLOG) injection correction dose 0-12 Units  0-12 Units Subcutaneous 4x Daily Ongtengco, Angelita Redbird, MD   4 Units at 12/15/23 2050  . lidocaine  (XYLOCAINE ) 1 % injection 0.5 mL  0.5 mL Subcutaneous As Directed Sethi, Shawn Jay, DO      . lidocaine  (XYLOCAINE ) 1 % injection 0.5 mL  0.5 mL Subcutaneous As Directed Ongtengco, Angelita Redbird, MD      . lidocaine  (XYLOCAINE ) 1 % injection 3 mL  3 mL Subcutaneous As Directed Mufuka, Bolanle Metiko, MD      . melatonin disintegrating tablet 3 mg  3 mg Oral QHS PRN Ongtengco, Angelita Redbird, MD      . multivitamin 400 mcg tablet 1 tablet  1 tablet Oral Daily after lunch Ongtengco, Angelita Redbird, MD   1 tablet at 12/16/23 1159  . pantoprazole  (PROTONIX ) EC tablet 40 mg  40 mg Oral Daily Ongtengco, Westley Alan, MD   40 mg at 12/16/23 0911  . polyethylene glycol (MIRALAX ) packet 17 g  17 g Oral Daily Pia Angelita Redbird, MD   17 g at 12/16/23 0908  . predniSONE  (DELTASONE ) tablet 5 mg  5 mg Oral Daily Ongtengco, Angelita Redbird, MD   5 mg at 12/16/23 0911  .  pregabalin  (LYRICA ) capsule 100 mg  100 mg Oral BID Pia Angelita Redbird, MD   100 mg at 12/16/23 0911  . sennosides-docusate (SENOKOT-S) 8.6-50 mg tablet 2 tablet  2 tablet Oral BID Pia Angelita Redbird, MD   2 tablet at 12/16/23 0911  . sertraline  (ZOLOFT ) tablet 100 mg  100 mg Oral Daily Pia Angelita Redbird, MD   100 mg at 12/16/23 0912  . tacrolimus  (ENVARSUS  XR) extended-release tablet 1.5 mg  1.5 mg Oral Daily Mufuka, Bolanle Metiko, MD   1.5 mg at 12/16/23 0911  . tamsulosin  (FLOMAX ) capsule 0.8 mg  0.8 mg Oral Daily Pia Angelita Redbird, MD   0.8 mg at 12/16/23 0911  .  thiamine (Vitamin B-1) tablet 100 mg  100 mg Oral Daily Ongtengco, Angelita Redbird, MD   100 mg at 12/16/23 9088  . traZODone  (DESYREL ) tablet 50 mg  50 mg Oral QHS PRN Ongtengco, Angelita Redbird, MD      . vancomycin  (VANCOCIN ) in 0.9% sodium chloride  IVPB 1 g/250 mL  1 g Intravenous Q24H Fleeta Ruts, MD 250 mL/hr at 12/16/23 1531 1 g at 12/16/23 1531  . vancomycin  pharmacy consult   XX consult Fleeta Ruts, MD         PHYSICAL EXAMINATION     Current Vital Signs 24h Vital Sign Ranges  Temp 36.6 C (97.9 F) (12/16/23 1123) Temp  Avg: 36.6 C (97.8 F)  Min: 36.4 C (97.6 F)  Max: 36.6 C (97.9 F)  BP 138/70 (12/16/23 1123) BP  Min: 138/70  Max: 151/76  HR 52 (12/16/23 0524) Pulse  Avg: 56  Min: 52  Max: 61  RR 16 (12/16/23 1123) Resp  Avg: 15.8  Min: 15  Max: 16  O2sat 98 %   SpO2  Avg: 99 %  Min: 98 %  Max: 100 %  ABW Wt Readings from Last 1 Encounters:  12/16/23 89.5 kg (197 lb 5 oz)   IDW Ideal body weight: 73 kg (160 lb 15 oz) Adjusted ideal body weight: 79.6 kg (175 lb 7.8 oz)     General: alert and cooperative  and in NAD Eyes: conjunctiva clear Neck: supple HENT: oropharynx clear, moist mucous membranes Cardiovascular:regular rate and rhythm, without murmurs, rubs or gallops Respiratory: breath sounds decreased throughout Abdomen: soft, nontender, nondistended, normoactive bowel sounds  Extremities: no lower extremity edema Neurologic:GCS 15   LINES & TUBES Patient Lines/Drains/Airways Status     Active PICC Line / CVC Line / PIV Line / Drain / Intraosseous Line / ART Line / Line / NG/OG Tube     Name Placement date Placement time Site Days   Peripheral IV 12/09/23 Distal;Left Forearm 12/09/23  2330  Forearm  7   Urethral Catheter 12/10/23  0255  --  6           INVESTIGATIONS  All tests documented below have been reviewed since last patient visit/note.  Recent Labs  Lab 12/12/23 0518 12/13/23 0615 12/14/23 0553 12/15/23 0829 12/16/23 0848  WBC 5.3 7.7 6.0 5.8  6.1  HGB 13.1* 13.4* 13.3* 13.7 13.7  PLT 96* 101* 96* 94* 113*       Recent Labs  Lab 12/12/23 0518 12/13/23 0615 12/14/23 0553 12/15/23 0828 12/16/23 0848  AST 16  --   --   --   --   ALT 18  --   --   --   --   ALKPHOS 60  --   --   --   --  TBILI 0.6  --   --   --   --   ALB 2.9*   < > 2.9* 2.9* 2.8*   < > = values in this interval not displayed.     Recent Labs  Lab 12/12/23 0518 12/13/23 0615 12/14/23 0553 12/15/23 0828 12/16/23 0848  CREATININE 1.5* 1.5* 1.2 1.4* 1.1     Estimated Creatinine Clearance: 69.3 mL/min (based on SCr of 1.1 mg/dL). - Adjusted Ideal Body Weight CrCl Range (Ideal BW, Actual BW): (63.6, 78) ml/min                                     TDM Lab Results  Component Value Date   VANCOTROUGH 11.8 12/14/2023  , No results found for: VANCOPEAK, and No results found for: VANCORANDOM    MICROBIOLOGY Susceptibility data from last 90 days. Collected Specimen Info Organism Daptomycin  Linezolid  Nitrofurantoin Oxacillin Tetracycline Trimeth/Sulfa Vancomycin   12/10/23 Urine from Catheter, Indwelling, Foley Methicillin Resistant Staphylococcus aureus (MRSA)  S  S  S  R  R  S  S    Mixed urogenital flora            VIRUS TESTING  Recent Labs    05/20/21 1218 11/22/22 1219  CMVDNA Not Detected Not Detected  , No results for input(s): EBVCOPY, EBVDNA in the last 73719 hours.   EKG Recent Labs    09/01/23 1228 12/10/23 0049 12/10/23 0739 12/13/23 0915 12/13/23 2106  QTC 408 [phone] 391    PATHOLOGY No recent and relevant path.    IMAGING 12/10/23 CT abdomen and pelvis with contrast. Thick-walled bladder which may be seen in the setting of cystitis. Correlate with urinalysis. No other cause for abdominal pain identified. 12/10/23 US  renal transplant. No hydronephrosis or peritransplant collection. Patent renal transplant vasculature with normal direction of flow and normal intraparenchymal resistive indices. 12/15/23 MRI  spine with contrast. No evidence of discitis osteomyelitis. No paraspinous fluid collection. Unchanged multilevel degenerative changes greatest at the L5-S1 level where a subarticular and central disc extrusion with an associated annular fissure contribute to lateral recess stenosis with abutment of the descending right S1 nerve root. Severe bilateral foraminal stenosis at L5-S1, which is unchanged.   ASSESSMENT AND PLAN   ASSESSMENT 73 y.o. 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 Now re-admitted with abdominal pain, AKI, and MRSA UTI. Transplant ID consulted in this setting.   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 Mr. Windhorst presented at [facility] on 12/09/23 with renal graft tenderness suggestive of transplant pyelonephritis in the setting of indwelling urinary catheter. Urine culture positive for MRSA. No evidence of disseminated MRSA infection: 12/11/23 blood cultures are negative to date. The patient has been receiving vancomycin  with improvement in graft tenderness. Given overall frailty of the patient, fluctuating renal function, potential nephrotoxicity of vancomycin  and high-dose TMP/SMX, and limited ability to closely  monitor the patient after discharge, I suggest to consider transition from  vancomycin  to daptomycin  at the time of discharge.    RECOMMENDATIONS (Estimated Creatinine Clearance: 69.3 mL/min (based on SCr of 1.1 mg/dL).  - continue vancomycin  while admitted (appreciate pharmacy's assistance with dosing) - before discharge, stop vancomycin  and start daptomycin  6 mg/kg q24h with planned end of therapy on 12/25/23 - patient will require weekly labs inclusive of CK while on daptomycin ; labs to be monitored by transplant coordinator Jay Garre, RN) - follow 12/11/23 blood cultures - Transplant ID video visit on 01/04/24   For questions during the day page Transplant ID Team 4 [id number]) or attending of record directly (see provider team). Infectious Diseases is available in-house from 8A-5P. If urgent assistance is needed outside of these hours, please page the Infectious Diseases Team Pager assigned to the patient for assistance. The Infectious Diseases consult pagers are available after business hours for emergencies only. Available records since last patient note/visit reviewed. Recommendations discussed with primary service.   Attestation Statement:   I personally performed the service. (TP)  BIAGIO RHYME, MD

## 2023-12-16 NOTE — Progress Notes (Signed)
 Nephrology Consult Note: Transplant  History of Present Illness   [name] is a 73 y.o. 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 Was found to have UTI and AKI so was admitted to medicine. Transplant Nephrology is consulted for immunosuppression management.   Interval events   No acute events overnight Patient feels that he is back to his baseline MRI spine completed and is negative for discitis or osteomyelitis Tentative discharge today or tomorrow  Past Medical History    Patient Active Problem List   Diagnosis Date Noted  . Acute lumbar back pain 12/14/2023  . Lumbar pain 12/14/2023  . Debility 12/14/2023  . Hypokalemia 12/10/2023  . Vomiting and diarrhea 09/03/2023  . Limited mobility 05/14/2023  . Pyelonephritis, acute 05/12/2023  . Complicated urinary tract infection 03/30/2023  . Pyelonephritis of transplanted kidney (HHS-HCC) 03/30/2023  . AKI (acute kidney injury) () 03/30/2023  . Pancreatic duct stones (HHS-HCC) 03/06/2023  . Pyelonephritis 11/23/2022  . Acute cystitis without hematuria 10/26/2022  . Multiple subsegmental pulmonary emboli without acute cor pulmonale (CMS/HHS-HCC) 07/08/2022  . Influenza A 07/08/2022  . DVT (deep venous thrombosis) (CMS/HHS-HCC) 07/08/2022  . Thrombocytopenia () 09/16/2019  . Complete heart block, transient (CMS/HHS-HCC) 09/12/2019  . Hydrocephalus (CMS/HHS-HCC) 09/02/2019  . Subarachnoid hemorrhage (CMS/HHS-HCC) 08/22/2019  . S/P laminectomy 08/22/2019  . Sinus arrhythmia 08/22/2019  . Hyponatremia 08/22/2019  . Hypertensive urgency 08/22/2019  . Difficulty with CPAP use 03/28/2019  . OSA on CPAP 03/25/2019  . Carpal tunnel syndrome of right wrist 08/17/2018  . Right otitis media with effusion 01/13/2018  . 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 Diarrhea due to drug  09/23/2017  . Diabetes mellitus (CMS/HHS-HCC) 09/23/2017  . Hypertension 09/23/2017  . Moderate OSA. PLMS w/ arousal. (SPLIT-CMS 07/29/17: Overall AHI 29/hr, NREM AHI 28/hr, Lateral AHI 29/hr) 08/03/2017  . Dysfunction of right eustachian tube 05/04/2017  . Referred otalgia of right ear 11/26/2016  . Musculoskeletal neck pain 11/26/2016  . Temporomandibular joint dysfunction 11/26/2016  . Obesity (BMI 30-39.9), unspecified 07/18/2016  . Constipation 05/22/2016  . Weight gain 04/14/2016  . Aftercare following organ transplant 04/14/2016  . Mineral metabolism disorder 04/14/2016  . BK viremia 03/05/2016  . 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 Primary insomnia 12/18/2015  . Vitamin D  deficiency 11/20/2015  . Hypomagnesemia 11/20/2015  . Urinary retention with incomplete bladder emptying 11/19/2015  . Essential hypertension 11/15/2015  . Immunosuppression (CMS/HHS-HCC) 11/07/2015  . S/P liver transplant (CMS/HHS-HCC) 11/07/2015  . S/P kidney transplant (HHS-HCC) 11/07/2015  . Need for prophylactic antibiotic 11/07/2015  . Colon polyp 10/28/2015  . Type 2 diabetes mellitus, with long-term current use of insulin  (CMS/HHS-HCC) 07/12/2012    Social History   Socioeconomic History  . Marital status: Married  Tobacco Use  . Smoking status: Never  . Smokeless tobacco: Never  Vaping Use  . Vaping status: Never Used  Substance and Sexual Activity  . Alcohol use: No  . Drug use: Never  . Sexual activity: Never   Social Drivers of Corporate investment banker Strain: Low Risk  (12/10/2023)   Overall Financial Resource Strain (CARDIA)   . Difficulty of Paying Living Expenses: Not hard at all  Food Insecurity: No Food Insecurity (12/10/2023)   Hunger Vital Sign   . Worried About Programme researcher, broadcasting/film/video in the Last Year:  Never true   . Ran Out of Food in the Last Year: Never true  Transportation Needs: No Transportation Needs (12/10/2023)   PRAPARE -  Transportation   . Lack of Transportation (Medical): No   . Lack of Transportation (Non-Medical): No  Housing Stability: Low Risk  (12/10/2023)   Housing Stability Vital Sign   . Unable to Pay for Housing in the Last Year: No   . Number of Times Moved in the Last Year: 0   . Homeless in the Last Year: No    Family History  Problem Relation Name Age of Onset  . GI problems Mother    . Diabetes type II Father Lynwood Maimone   . Heart disease Father Lynwood Fenn   . Cirrhosis Neg Hx    . HCC Neg Hx    . Liver disease Neg Hx    . Anesthesia problems Neg Hx    . Malignant hyperthermia Neg Hx      Allergies   Allergies  Allergen Reactions  . Bactrim [Sulfamethoxazole-Trimethoprim] Vomiting    Made patient feel bad and had diarrhea  . Semaglutide Vomiting    Home Medications   Current Outpatient Medications  Medication Instructions  . acetaminophen  (TYLENOL ) 325-650 mg, Oral, Every 6 hours PRN  . amLODIPine (NORVASC) 10 mg, Daily  . apixaban (ELIQUIS) 5 mg, Oral, Every 12 hours  . atorvastatin (LIPITOR) 20 mg, Oral, Every morning  . azithromycin  (ZITHROMAX ) 250 mg, Daily  . bismuth subsalicylate (PEPTO-BISMOL) 262 mg/15 mL suspension 30 mLs, Daily PRN  . cholecalciferol (VITAMIN D3) 2,000 Units, Oral, Daily  . ferrous sulfate  325 mg, Oral, Every 48 hours, Take a lunch time. Separate from ciprofloxacin  by ~6 hours  . finasteride (PROSCAR) 5 mg, Oral, Daily  . flash glucose sensor (FREESTYLE LIBRE 14 DAY SENSOR) kit 1 kit, Misc.(Non-Drug; Combo Route), Every 14 days, E11.29, E11.649. Freestyle libre 14 day sensor.  . fluticasone  propionate (FLONASE ) 50 mcg/actuation nasal spray 2 sprays, 2 times Daily PRN  . insulin  LISPRO PROTAMINE-LISPRO (HUMALOG KWIKPEN MIX 75/25) 100 unit/mL (75-25) pen injector 55 units with breakfast and 35 units with dinner  . magnesium  oxide (MAG-OX) 400 mg, Oral, Daily with lunch, Separate from ciprofloxacin  by ~6 hours  . multivitamin 400 mcg tablet 1  tablet, Oral, Daily after lunch, Separate from ciprofloxacin  by ~6 hours  . ondansetron  (ZOFRAN ) 8 mg, Oral, Every 8 hours PRN  . pantoprazole  (PROTONIX ) 40 mg, Oral, Daily  . polyethylene glycol (MIRALAX ) powder Take 1 capful (17 g total) by mouth once daily. Mix in 4-8 ounces of fluid prior to taking.  . predniSONE  (DELTASONE ) 5 mg, Oral, Daily  . pregabalin  (LYRICA ) 100 mg, 2 times Daily PRN  . sennosides-docusate (SENOKOT-S) 8.6-50 mg tablet 2 tablets, Oral, 2 times Daily  . sertraline  (ZOLOFT ) 100 mg, Daily  . sterile water  for injection 5 mLs, Other, As Directed  . syringe with needle 3 mL 23 gauge x 1 1/2 Syrg 1 each, Misc.(Non-Drug; Combo Route), As Directed  . syringe with needle, safety 3 mL 25 gauge x 1 Syrg 1 each, Misc.(Non-Drug; Combo Route), As Directed  . tacrolimus  (ENVARSUS  XR) 1 mg, Oral, Every morning  . tamsulosin  (FLOMAX ) 0.4 mg capsule Take 2 capsules (0.8 mg total) by mouth once daily. Take 30 minutes after same meal each day.  . traZODone  (DESYREL ) 50 mg, Oral, Nightly PRN  . triamcinolone 0.025 % cream Daily PRN     Active Medications   . acetaminophen   975 mg Oral  TID  . amLODIPine  5 mg Oral Daily  . apixaban  5 mg Oral Q12H SCH  . [Held by provider] atorvastatin  20 mg Oral Daily  . cholecalciferol  2,000 Units Oral Daily  . [START ON 12/17/2023] DAPTOmycin  (CUBICIN ) IV  6 mg/kg Intravenous Q24H  . finasteride  5 mg Oral Daily  . insulin  GLARGINE-yfgn (SEMGLEE ) injection  40 Units Subcutaneous QHS  . insulin  LISPRO (AdmeLOG, HumaLOG) injection  15 Units Subcutaneous TID CC  . insulin  LISPRO (AdmeLOG, HumaLOG) injection  0-12 Units Subcutaneous 4x Daily  . multivitamin  1 tablet Oral Daily after lunch  . pantoprazole   40 mg Oral Daily  . polyethylene glycol  17 g Oral Daily  . predniSONE   5 mg Oral Daily  . pregabalin   100 mg Oral BID  . sennosides-docusate  2 tablet Oral BID  . sertraline   100 mg Oral Daily  . tacrolimus   1.5 mg Oral Daily  .  tamsulosin   0.8 mg Oral Daily  . thiamine  100 mg Oral Daily    Objective    Current Vital Signs 24h Vital Sign Ranges  T 36.4 C (97.5 F) Temp  Avg: 36.5 C (97.7 F)  Min: 36.4 C (97.5 F)  Max: 36.6 C (97.9 F)  BP (!) 146/83 BP  Min: 138/70  Max: 151/76  HR 66 Pulse  Avg: 59.7  Min: 52  Max: 66  RR 16 Resp  Avg: 15.8  Min: 15  Max: 16  SaO2 98 %   SpO2  Avg: 99 %  Min: 98 %  Max: 100 %          Ins & Outs I/O last 2 completed shifts: In: 550 [P.O.:300; IV Piggyback:250] Out: 1695 [Urine:1695] Current Shift:  No intake/output data recorded.   Physical Exam Gen:                NAD, A&Ox3 CV:                  RRR Pulm:               CTAB, no w Abd:                 soft, NT, ND Other:              no pedal edema   Labs   Recent Labs  Lab 12/14/23 0553 12/15/23 0829 12/16/23 0848  WBC 6.0 5.8 6.1  HGB 13.3* 13.7 13.7  PLT 96* 94* 113*   Recent Labs  Lab 12/09/23 2339 12/10/23 0742 12/11/23 0551 12/12/23 0518 12/13/23 0615 12/14/23 0553 12/15/23 0828 12/16/23 0848  NA 139   < > 138 136   < > 136 137 136  K 3.9   < > 4.6 4.5   < > 3.8 4.0 3.5  CL 100   < > 104 106   < > 104 104 107  CO2 22   < > 23 21   < > 21 22 22   BUN 25*   < > 23* 27*   < > 19 24* 25*  CREATININE 1.8*   < > 1.6* 1.5*   < > 1.2 1.4* 1.1  CALCIUM  9.7   < > 9.1 8.0*   < > 8.7 8.7 8.7  ALKPHOS 68  --  62 60  --   --   --   --   GLUCOSE 80   < > 103 322*   < > 224*  222* 149*   < > = values in this interval not displayed.   Lab Results  Component Value Date   PTH 103 (H) 05/20/2021   CALCIUM  8.7 12/16/2023   CAION 1.24 12/10/2023   PHOS 3.1 12/16/2023   Lab Results  Component Value Date   IRON 31 (L) 10/15/2015   TIBC 114 (L) 10/15/2015   FERRITIN 148 09/28/2015   PCTSAT 27 10/15/2015    FK506 (Tacrolimus )  Date Value Ref Range Status  12/15/2023 3.0 (L) 5.0 - 20.0 ng/mL Final  12/10/2023 5.9 5.0 - 20.0 ng/mL Final  09/09/2023 7.9 5.0 - 20.0 ng/mL Final    Recent Labs   Lab 12/10/23 0258  COLORU Light Yellow  CLARITYU Cloudy*  SPECGRAV 1.044*  LABPH 6.5  PROTEINUA Trace*  GLUCOSEU Negative  KETONESU Trace*  BLOODU 1+*  NITRITE Negative  LEUKOCYTESUR 3+*  BILIRUBINUR Negative  UROBILINOGEN 0.2  RBCUA 42*  WBCUA >182*  SQUAMEPI 0  HYALINE 0    Assessment and Recommendations    Allograft function  SLK in 2017 Clearance: Baseline creatinine appears to be around 1.2 and his labs on admission are consistent with this.  Minimal proteinuria historically. Imaging: He had a CT scan on admission as well as a renal ultrasound 12/10/2023 that were consistent with cystitis but otherwise normal-appearing renal allograft no concerns about his renal allograft at this time Daily RFP  Immunosuppression FK506 (Tacrolimus )  Date Value Ref Range Status  12/15/2023 3.0 (L) 5.0 - 20.0 ng/mL Final  12/10/2023 5.9 5.0 - 20.0 ng/mL Final  09/09/2023 7.9 5.0 - 20.0 ng/mL Final   CNI: home dose of Envarsus  1 mg daily Continue Envarsus  1.5 mg daily FK Goal: 5-7 Next FK level: 6/12 MPA: none, discontinued due to recurrent urinary tract infections Steroids: pred 5  Prophylaxis None at this time  BP/Volume Home meds: None Blood pressure elevated, continue amlodipine 5 mg daily Need to check daily blood pressure and keep a log as this may drop at home with improved volume status  MRSA UTI Has history of growing numerous gram-negative rods in his blood secondary to enlarged prostate and poor urinary outflow.  He is growing MRSA in his blood this time is an atypical urinary pathogen and as a result is going through a more extensive workup Appreciate ID input Blood cultures negative MRI spine negative for infectious source  Thank you for this consult. We will continue to follow the patient with you.  Please feel free to call with questions or concerns.   MICAH HELENE LATINO, MD  Transplant service pager [phone]  The Nephrology Consult team is available  in-house from 7A-5P. If urgent assistance is needed outside of these hours, please page the Nephrology on-call pager [phone] for assistance. The Nephrology consult pagers are available after business hours for emergencies only.   Attestation Statement:   I personally saw and evaluated the patient, and participated in the management and treatment plan as documented in the resident/fellow note.  MICAH HELENE LATINO, MD

## 2023-12-17 NOTE — Discharge Summary (Signed)
 [facility] Medicine Discharge Summary  Admit Date: 12/09/2023 Discharge Date: 12/17/2023  8:18 PM  Admitting Physician: Marcus Haskell, MD Discharge Physician: Verlie Tawni Ego, MD  Primary Care Provider: Jama Pat ORN., MD, Phone [phone]  Discharge Destination: Home with home health  Admission Diagnoses:  Bradycardia [R00.1] Generalized abdominal pain [R10.84] S/P kidney transplant (HHS-HCC) [Z94.0] S/P liver transplant (CMS/HHS-HCC) [Z94.4]  Discharge Diagnoses:  Principal Problem:   Complicated urinary tract infection Active Problems:   Type 2 diabetes mellitus, with long-term current use of insulin  (CMS/HHS-HCC)   Immunosuppression (CMS/HHS-HCC)   S/P liver transplant (CMS/HHS-HCC)   S/P kidney transplant (HHS-HCC)   Essential hypertension   Hypomagnesemia   Moderate OSA. PLMS w/ arousal. (SPLIT-CMS 07/29/17: Overall AHI 29/hr, NREM AHI 28/hr, Lateral AHI 29/hr)   OSA on CPAP   Hypokalemia   Acute lumbar back pain   Lumbar pain   Debility Resolved Problems:   * No resolved hospital problems. *  Primary Diagnosis: Admitted for MRSA UTI  Changes Made (with rationale):  Discharged on home IV antibiotics with IV Daptomycin  and course to be completed on 6/20  Anticipatory Guidance for Outpatient Care:  Patient to have CBC with diff, CMP, CK checked on 12/21/23. Please fax lab results to patient's transplant coordinator Nat Carolin [phone]    Results Pending at Discharge:  None Please see phone numbers at end of this summary for lab contact information.   Follow-up/Care Transition Plan: Sched. appts: Future Appointments  Date Time Provider Department Center  01/04/2024  9:30 AM Darrow Gab, MD ID-Txp [id number] [facility]  01/05/2024  1:40 PM Bartholomew Willma Caldron, NP DRAHADVGI [facility]  01/28/2024  8:00 AM Mariam Philbert Carne, MD DSUROLOGY [facility]  01/28/2024 10:00 AM Smitty Cathryne Gibson, NP [facility]  01/28/2024  11:20 AM RENAL TRANSPLANT PROVIDER 1 [facility]    Follow-up info: [facility] [address] [place]  [address] [phone]    Jama Pat ORN., MD [address] [place] [address] [phone]  Follow up on 12/30/2023 2:45pm      Allergies/Intolerances:  Allergies  Allergen Reactions  . Bactrim [Sulfamethoxazole-Trimethoprim] Vomiting    Made patient feel bad and had diarrhea  . Semaglutide Vomiting     New Adverse Drug Events: none  Medications:     Current Discharge Medication List     PAUSE taking these medications      Instructions  atorvastatin 20 MG tablet Wait to take this until: December 28, 2023 Quantity: 30 tablet Refills: 0  Commonly known as: LIPITOR Take 1 tablet (20 mg total) by mouth every morning Last time this was given: 20 mg on December 16, 2023  9:11 AM       START taking these medications      Instructions  DAPTOmycin  injection (ADULT IVP ONLY) Refills: 0  Commonly known as: CUBICIN  Inject 550 mg into the vein daily Pharmacy to determine diluent , concentration, and rate of administration. Last time this was given: 550 mg on December 17, 2023 11:40 AM   thiamine 100 MG tablet Quantity: 30 tablet Refills: 0  Commonly known as: Vitamin B-1 Take 1 tablet (100 mg total) by mouth once daily Last time this was given: 100 mg on December 17, 2023  8:38 AM       These medications have been CHANGED      Instructions  amLODIPine 10 MG tablet Refills: 0 What changed: how much to take  Commonly known as: NORVASC  Take 0.5 tablets (5 mg total) by mouth once daily Last time this was given: 5 mg on December 17, 2023  8:38 AM   insulin  LISPRO PROTAMINE-LISPRO 100 unit/mL (75-25) pen injector Refills: 0 What changed: additional instructions  Commonly known as: HumaLOG KWIKPEN MIX 75/25 55 units with breakfast and 35 units with dinner   polyethylene glycol powder Quantity: 238 g Refills: 0 What  changed:  when to take this reasons to take this  Commonly known as: MIRALAX  Take 1 capful (17 g total) by mouth once daily. Mix in 4-8 ounces of fluid prior to taking. Last time this was given: Ask your nurse or doctor       CONTINUE taking these medications      Instructions  acetaminophen  325 MG tablet Quantity: 30 tablet Refills: 0  Commonly known as: TYLENOL  Take 1-2 tablets (325-650 mg total) by mouth every 6 (six) hours as needed for Pain Last time this was given: 975 mg on December 17, 2023  3:51 PM   apixaban 5 mg tablet Quantity: 180 tablet Refills: 3  Commonly known as: ELIQUIS Take 1 tablet (5 mg total) by mouth every 12 (twelve) hours Doctor's comments: Place on file Last time this was given: 5 mg on December 17, 2023  8:38 AM   cholecalciferol 2,000 unit tablet Quantity: 30 tablet Refills: 11  Commonly known as: VITAMIN D3 Take 1 tablet (2,000 Units total) by mouth once daily Last time this was given: 2,000 Units on December 17, 2023  8:38 AM   ferrous sulfate  325 (65 FE) MG tablet Refills: 0  Take 1 tablet (325 mg total) by mouth every other day Take a lunch time. Separate from ciprofloxacin  by ~6 hours   finasteride 5 mg tablet Quantity: 90 tablet Refills: 0  Commonly known as: PROSCAR Take 1 tablet (5 mg total) by mouth once daily Last time this was given: 5 mg on December 17, 2023  8:37 AM   fluticasone  propionate 50 mcg/actuation nasal spray Refills: 0  Commonly known as: FLONASE  Place 2 sprays into both nostrils 2 (two) times daily as needed for Allergies or Rhinitis   FREESTYLE LIBRE 14 DAY SENSOR kit Quantity: 2 kit Refills: 11 Generic drug: flash glucose sensor  Use 1 kit every 14 (fourteen) days for 39 days E11.29, E11.649. Freestyle libre 14 day sensor.   magnesium  oxide 400 mg (241.3 mg magnesium ) tablet Refills: 0  Commonly known as: MAG-OX Take 1 tablet (400 mg total) by mouth daily with lunch Separate from ciprofloxacin  by ~6 hours    multivitamin 400 mcg tablet Refills: 0  Take 1 tablet by mouth Daily after lunch Separate from ciprofloxacin  by ~6 hours Last time this was given: 1 tablet on December 17, 2023 12:18 PM   ondansetron  8 MG tablet Quantity: 20 tablet Refills: 0  Commonly known as: ZOFRAN  Take 1 tablet (8 mg total) by mouth every 8 (eight) hours as needed for Nausea   pantoprazole  40 MG DR tablet Quantity: 30 tablet Refills: 0  Commonly known as: PROTONIX  Take 1 tablet (40 mg total) by mouth once daily Last time this was given: 40 mg on December 17, 2023  8:38 AM   Pepto-BismoL 262 mg/15 mL suspension Refills: 0 Generic drug: bismuth subsalicylate  Take 30 mLs by mouth once daily as needed   predniSONE  5 MG tablet Quantity: 120 tablet Refills: 3  Commonly known as: DELTASONE  Take 1 tablet (5 mg total) by mouth once daily Doctor's comments: ICD10:  Z94.0  kidney transplant 11/06/2015; Z94.4 liver transplant 11/06/2015 Last time this was given: 5 mg on December 17, 2023  8:38 AM   pregabalin  100 MG capsule Refills: 0  Commonly known as: LYRICA  Take 100 mg by mouth 2 (two) times daily as needed Last time this was given: 100 mg on December 17, 2023  3:58 PM   sertraline  100 MG tablet Refills: 0  Commonly known as: ZOLOFT  Take 100 mg by mouth once daily Last time this was given: 100 mg on December 17, 2023  8:37 AM   sterile water  for injection Quantity: 5 mL Refills: 12  5 mLs by Other route as directed (for steroid reconstitution prior to administration.)   STOOL SOFTENER-STIMULANT LAXAT 8.6-50 mg tablet Quantity: 120 tablet Refills: 0 Generic drug: sennosides-docusate  Take 2 tablets by mouth 2 (two) times daily Last time this was given: 2 tablets on December 17, 2023  3:58 PM   syringe with needle 3 mL 23 gauge x 1 1/2 Syrg Quantity: 1 each Refills: 12  Use 1 each as directed (for steroid administration)   syringe with needle, safety 3 mL 25 gauge x 1 Syrg Quantity: 1 each Refills: 12  Use 1 each as  directed (for steroid administration)   tacrolimus  1 mg extended-release tablet Quantity: 30 tablet Refills: 0  Commonly known as: ENVARSUS  XR Take 1 tablet (1 mg total) by mouth every morning Last time this was given: 1.5 mg on December 17, 2023  8:37 AM   tamsulosin  0.4 mg capsule Quantity: 60 capsule Refills: 0  Commonly known as: FLOMAX  Take 2 capsules (0.8 mg total) by mouth once daily. Take 30 minutes after same meal each day. Last time this was given: 0.8 mg on December 17, 2023  8:37 AM   traZODone  50 MG tablet Refills: 0  Commonly known as: DESYREL  Take 1 tablet (50 mg total) by mouth at bedtime as needed for Sleep   triamcinolone 0.025 % cream Refills: 0  Apply topically once daily as needed       STOP taking these medications    azithromycin  250 MG tablet Commonly known as: ZITHROMAX          Brief History of Present Illness:   Per H&P [name] is a 73 y.o. 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   Patient states that he was of his relative health until around 3 days ago, when he developed central abdominal pain described as sharp.  States symptoms persisted, and he ultimately presented to the emergency department for further evaluation upon the advice of his wife.  Wife states that she wanted to be extra careful iso of his  transplanted kidney.  Otherwise, patient states that he is of his relative normal health.  Denies fevers, chills, chest pain, shortness of breath, cough, rhinorrhea, odynophagia, diarrhea, flank pain, right lower quadrant pain (site of transplanted kidney), and/or skin lesions.  Patient states that he is concerned that he has  a UTI.   Upon arrival to [facility], vital signs were notable for T97.74F, HR 76, BP 130/72, with saturations to upper 90s on room air.  Labs notable for lipase 64 (historically ~ this #), creatinine 1.8 (BL 1.1-1.2), BUN 25, HCO3 22, K3.9, NA 139, Ca 9.7, anion gap = 17, albumin  3.5, NA 139, mag 1.9, WBC 9.5 (BL ~ 4-6), Hb 15.4, PLT 127, lactate 2.4, and UA with > 182 WBC, 3+ leukocytes, 42 RBC, and 1+ blood with trace ketones (via exchange Foley).  CT abdomen pelvis wet read with circumferential bladder wall thickening without peritransplant fluid collection or hydronephrosis.  EKG with Mobitz type I second-degree AV block on my interpretation consistent with prior.  Patient was given APAP, morphine , 1 L LR, and started on Zosyn , and admitted to medicine for further evaluation.  Repeat lactate 1.1   During the time of my encounter, patient was resting in bed in no apparent distress.  Wife at the bedside.  Reviewed presenting history.  Accurate as above.  Only addition is that patient was reportedly seen by an outside provider for upper respiratory symptoms, starting ~ 1 w ago.  Denied current fevers, chills, shortness of breath, cough, rhinorrhea, dysphagia.  Was prescribed azithromycin , with dispense date on 12/02/2023.  Otherwise, denies sick contacts and recent medicine changes.  Added that his last bowel movement was 3 days ago, but he normally goes around every 3 days despite stool softeners.  Reviewed medication list.  On prednisone  5 mg, tacrolimus  1 mg, apixaban 5 mg twice daily (last 06/04 PM), atorvastatin, multivitamin, iron, finasteride, lispro-protamine 60 with  breakfast and 35 with dinner (does not eat lunch), magnesium  oxide, Zofran , pantoprazole , Pepto-Bismol (takes rarely), MiraLAX , senna docusate, pregabalin , sertraline , trazodone , and tamsulosin .   Patient lives at home with his wife.  Uses wheelchair to ambulate.  Denies smoking, drug use, or alcohol use.  Wife assisted with his medications.   _____________________   Hospital Course by Problem:  S. Aureus UTI Hx Recurrent UTI Chronic indwelling foley catheter 2/2 neurogenic bladder  Abdominal pain Presented with 3 days of abd pain, found to have abnormal UA and e/o cystitis on CT scan. Exam notable for lower abd pain without rebound. No other abd abnormalities identified on CT scan. Lactate 2.4 --> 1.1, no fevers, normal WBC.  Urine cultures +50-100K S. Aureus. Started Vancomycin  on 6/6 following results of Ucx. Discontinued piptazo 6/5-6/9. CK 19. MRI Lumbar spine with no e/o discitis/OM. Transplant ID followed patient during admission. Discharged on IV Daptomycin  with course to be completed on 12/25/23.   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 Received 2L IVF in ED with improvement in labs though slight worsening in function again. FK level 5.9 on 6/5. Continue prednisone  and tacrolimus  1mg  daily. Transplant nephrology had recommended increased dose of 1.5mg  daily however this was not on formulary and required a prior auth. Discussion with nephrology and patient's transplant coordinator with recommendation to continue 1mg  daily for now pending prior auth and repeat labs in the following week.SABRA   HTN Noted hypertensive over recent days. Started Amlodipine 5mg  daily   AGMA, resolved Significant electrolyte abnormalities with elevation in AG and HCO3 16. F/u labs with improvement.     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 H/o constipation, with bowel movements around every 3 days. No evidence of bowel obstruction on imaging. Continue MiraLAX , senna-docusate - PT/OT evaluations given some worsening of chronic weakness. Now signed off as patient appears to be at baseline   IDDM T2 (A1c = 8) Follows with endocrine outpatient.  Not on SGLT2i given recurrent UTI nor GLP-1 agonist due to chronic pancreatitis.  On lispro-protamine regimen at home. Managed with glargine and lispro while inpatient and transitioned back to prescribed home regimen at discharge.   History of DVT/PE  - Continue apixaban    Chronic pancreatitis with pancreatic stone and cyst Follows with biliary outpatient.  LFTs WNL on admission.  Lipase stably elevated.  No comment of pancreatitis on CT.    Bilateral prostate embolization (08/24)   Mood disorder - Continue sertraline , trazodone       Surgeries and Procedures Performed:  PICC Placement on 12/16/23  _____________________  Discharge Exam:  BP (!) 144/67 (BP Location: Right upper arm, Patient Position: Lying)   Pulse (!) 49   Temp 36.7 C (98.1 F) (Oral)   Resp 16   Ht 177.8 cm (5' 10)   Wt 89.3 kg (196 lb 13.9 oz)   SpO2 98%   BMI 28.25 kg/m     General: chronically ill appearing man, resting in bed, wife at bedside  Eyes: conjunctiva clear, anicteric sclera HENT: oropharynx clear, moist mucous membranes Cardiovascular: RRR. No m/r/g Respiratory: CTAB. Normal wob on room air Abdomen: Soft, ND, mild lower abd ttp. No rebound Extremities: no lower extremity edema, significant sarcopenia Skin: pale GU: foley catheter in place draining clear yellow urine  Psych: oriented x 3,  conversant  Neuro:  able to move all ext upon request   Pertinent Lab Testing: Recent Labs  Lab 12/15/23 0828 12/16/23 0848 12/17/23 0520  NA 137 136 138  K 4.0 3.5 4.3  CL 104 107 108  CO2 22 22 22   BUN 24* 25* 26*  CREATININE 1.4* 1.1 1.4*  GLUCOSE 222* 149* 291*  CALCIUM  8.7 8.7 8.7   Recent Labs  Lab 12/11/23 0551 12/12/23 0518  AST 22 16  ALT 26 18  ALKPHOS 62 60  TBILI 1.1 0.6    Recent Labs  Lab 12/15/23 0829 12/16/23 0848 12/17/23 0520  WBC 5.8 6.1 5.2  HGB 13.7 13.7 13.0*  HCT 41.8 40.8 39.9  PLT 94* 113* 103*   No results for input(s): APTT, INR in the last 168 hours.   Other Pertinent Labs:   Micro:   12/10/23 Ucx with 50-100k MRSA 12/11/23 Bcx NGTD 12/11/23 Nasal MRSA swba negative    Pertinent Imaging:   Results for orders placed during the hospital encounter of 12/09/23  US  RENAL TRANSPLANT COMPLETE  Narrative Procedure: Renal Transplant Ultrasound  Indication: UTI/AKI in transplanted kidney, eval hydro/stone, Z94.0 Kidney transplant status (HHS-HCC)  Comparison: 08/16/2023 Ultrasound renal  Technique: Gray-scale, color Doppler, and spectral images of the renal transplant.  Findings: TRANSPLANT KIDNEY: *  Renal transplant measures 9.6 cm. It is located in the right pelvis. *  There is no peritransplant fluid collection. *  Ureter is not dilated; there is no hydronephrosis. Renal pelvis measures up to 0.8 cm.  VASCULATURE: *  Upper pole resistive index: 0.60-0.65, previously 0.81-0.84 *  Interpole resistive index: 0.69-0.70, previously 0.52-0.63 *  Lower pole resistive index: 0.65-0.71 previously 0.57  *  Main renal artery anastomotic peak systolic velocity measures 58.9 cm/sec, RI  0.89, previously 84.1 cm/s, RI 0.90. *  Ipsilateral external iliac artery peak systolic velocity measures 39.9 cm/sec. *  Main renal vein is patent.  PELVIS: Bladder is normal with Foley in place.  Impression: 1.  No hydronephrosis or  peritransplant collection. 2.  Patent renal transplant vasculature with normal direction of flow and normal intraparenchymal resistive indices.   The preliminary report (critical or emergent communication) was reviewed prior to this dictation and there are no critical differences between the preliminary results and the impressions in this final report.  Electronically Reviewed by:  Fairy Cohens, MD, [facility] Electronically Reviewed on:  12/11/2023 8:48 AM  I have reviewed the images and concur with the above findings.  Electronically Signed by:  Christen Loa, MD, [facility] Electronically Signed on:  12/11/2023 9:14 AM   Results for orders placed during the hospital encounter of 12/09/23  CT ABDOMEN PELVIS WITH CONTRAST  Narrative CT abdomen and pelvis with IV contrast  Comparison:  09/03/2023.  Indication:  abd pain, renal and liver transplant, R10.84 Generalized abdominal pain.  Technique:  CT imaging was performed of the abdomen and pelvis following the administration of intravenous contrast.  Iodinated contrast was used due to the indications for the examination, to improve disease detection and further define anatomy. Coronal and sagittal reformatted images were generated and reviewed.  Findings: - Lower Thorax: No suspicious pulmonary abnormalities. No pleural or pericardial effusions.  - Liver: Post liver transplant. No suspicious hepatic masses are identified.  The portal and hepatic veins are patent.  - Biliary and Gallbladder: No intrahepatic or extrahepatic bile duct dilatation. The gallbladder is surgically absent.  - Spleen: Normal in appearance.  - Pancreas: Dilated main pancreatic duct with an obstructing 0.8 cm stone in the head. Additional sequela of chronic pancreatitis including additional calcifications and gland atrophy.  - Adrenal Glands: Normal in appearance.  - Kidneys: Bilateral renal atrophy, including multifocal scarring  and atrophy of the right lower quadrant transplant. No suspicious renal lesions. No hydronephrosis.  - Abdominal and Pelvic Vasculature: No abdominal aortic aneurysm.  - Gastrointestinal Tract: No abnormal dilation or wall thickening.  - Peritoneum/Mesentery/Retroperitoneum: No free fluid.  No free intraperitoneal air.  - Lymph Nodes: No retroperitoneal, mesenteric, pelvic, or inguinal lymphadenopathy.  - Bladder: Normal in appearance.  - Pelvic Organs: Foley catheter in place with bladder wall thickening and intraluminal air.  - Body Wall: Fat-containing umbilical hernia..  - Musculoskeletal:  No aggressive appearing osseous lesions.   Impression: 1.  Thick-walled bladder which may be seen in the setting of cystitis. Correlate with urinalysis. 2.  No other cause for abdominal pain identified.  The preliminary report (critical or emergent communication) was reviewed prior to this dictation and there are no critical differences between the preliminary results and the impressions in this final report.   Electronically Signed by:  Morene Fishman, MD, [facility] Electronically Signed on:  12/10/2023 8:33 AM   Results for orders placed during the hospital encounter of 12/09/23  MRI LUMBAR SPINE WITH AND WITHOUT CONTRAST  Narrative MRI LUMBAR SPINE WITHOUT AND WITH CONTRAST  INDICATION: Low back pain, infection suspected, c/f staph infection, M54.50 Low back pain, unspecified  COMPARISON: MRI lumbar spine January 31, 2020 and CT abdomen and pelvis December 10, 2023   TECHNIQUE/PROTOCOL: Infection/bone metastasis protocol MRI of the lumbar spine was performed pre and postcontrast administration.   FINDINGS: Anatomical variants: Conventional spinal numbering Alignment: Normal. Bone  marrow signal: No suspicious lesions. Conus medullaris: terminates at approximately L1. Spinal Cord and Cauda Equina: Visualized portions of the spinal cord is normal in morphology  and signal. Normal appearance of the cauda equina.  Paraspinal soft tissues: The partially imaged right renal cyst.  Degenerative changes: Unchanged multilevel degenerative changes without high-grade spinal canal stenosis. Unchanged multilevel degenerative changes greatest at the L5-S1 level where a subarticular and central disc extrusion with an associated annular fissure contribute to lateral recess stenosis with abutment of the descending right S1 nerve root. Severe bilateral foraminal stenosis at L5-S1, where it is greatest.   Sacroiliac joints: No significant degenerative changes of the visualized SI joints  Impression 1.  No evidence of discitis osteomyelitis. No paraspinous fluid collection. 2.  Unchanged multilevel degenerative changes greatest at the L5-S1 level where a subarticular and central disc extrusion with an associated annular fissure contribute to lateral recess stenosis with abutment of the descending right S1 nerve root. 3.  Severe bilateral foraminal stenosis at L5-S1, which is unchanged.     The preliminary report (critical or emergent communication) was reviewed prior to this dictation and there are no critical differences between the preliminary results and the impressions in this final report.  Electronically Reviewed by:  Ellouise Donovan, MD, [facility] Electronically Reviewed on:  12/15/2023 2:26 PM  I have reviewed the images and concur with the above findings.  Electronically Signed by:  Aleene Miu, MD, [facility] Electronically Signed on:  12/15/2023 5:18 PM   _____________________  Code Status: DNAR Goals of care were addressed during this admission: patient is NDAR.   Status on Discharge:  Current activity: Bedfast (12/16/23 2000) Current mobility: Very limited (12/16/23 2000)  Activity Recommendation: activity as tolerated  Other Discharge Instructions: Services setup at discharge: Home Health PT/OT, Nurse, Aide and Home  infusion for IV antibiotics Tubes/lines at discharge: PICC line  Diet: Diet carbohydrate level 2 (60 gm/meal) _____________________  Time spent on discharge process: 45 minutes    TAWNI GARR HARTIGAN, MD [facility]  12/17/2023   Hospital Contact Information:  [facility]) [facility]) [facility])  Pending tests:  Laboratory: [phone] Microbiology: [phone] Pathology: [phone] Radiology: [phone]  General questions: [phone] Pending tests: Laboratory: [phone] Microbiology: [phone] Pathology: [phone] Radiology: [phone]  General questions:  [phone] Pending tests:  Laboratory: [phone] Microbiology: [phone] Pathology: [phone] Radiology: [phone]  General questions:  [phone]

## 2023-12-31 NOTE — Progress Notes (Signed)
 [facility] Return Evaluation   Patient Profile:  [name] is a 73 y.o. male seen for follow up of chronic calcific pancreatitis, PD stone and dilated PD duct    History: [name] was seen in [facility] on 04/09/23. Please review last note. In review, AZEEZ Burch is a 73 y.o. 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 [facility] for f/u of pancreatic duct stones and dilated PD duct.   Patient presented to ED with abdominal pain vomiting 02/23/23 found to have UTI. He has had multiple ED and hospital admissions for UTI's and pyelonephritis.  Now with chronic foley. During work up he underwent CT abdomen pelvis which noted an unchanged 0.5 cm pancreatic head intraductal stone with upstream dilation measuring up to 1 cm in the pancreatic neck and stable findings of chronic pancreatitis.    He denies any history of acute pancreatitis or chronic pancreatitis - though imaging notes calcifications in the pancreas consistent with chronic pancreatitis. He denies any family hx of pancreatic cancer. He denies any past hx of heavy alcohol use. His gallbladder has been out at least since his transplants. He states he makes a lot of kidney stones but was never told about pancreas stones. Per chart review he has had elevations in his lipase levels. In May 2023 his lipase was 1057 with ULN being 300 and then in 12/11/21 it trended down to 740. In July of this year lipase was noted to be 54 and 79 with ULN being 51. He has never had any upper abdominal pain.    Chart review notes that this stone and PD dilation were noted on imaging back in April of 2024. However, he had a CT  outside in January which does not mention the PD stone or dilated duct. Previous imaging prior to his liver txp shows cholelithiasis.    Repeat CT scan 04/02/23 which again noted an unchanged 8 mm obstructing PD stone with downstream ductal dilation. LFT's still normal.    EUS 05/29/2023 which confirmed sonographic changes consistent with moderate to severe chronic pancreatitis.  The main PD measured 3 mm in the head, 12 mm in the body, and 5.4 mm in the tail.  Multiple intraductal stones were seen.  A cyst in the head of the pancreas measured 14 x 10 mm.  There were no worrisome features.  Patient presents today for follow-up.    Interval History:  Since last visit appears he was recently hospitalized for MRSA UTI.  He was treated with antibiotics and discharged.  During that time he did have imaging with a CT abdomen pelvis which showed dilated main pancreas duct with obstructing 0.8 cm stone in the head.  Additional sequela of chronic pancreatitis including additional calcifications and gland atrophy. No mention of the pancreas cyst.  Denies abdominal pain or pain associated with eating. Denies fever/chills, jaundice, itching, acholic stools, dark tea colored urine. No recent weight loss.   Past Medical History:  Diagnosis Date  . Chronic kidney disease 4-1 -16  . Colon polyp   . Diabetes mellitus type 2, uncomplicated (CMS/HHS-HCC)   . Hypertension   . Kidney disease   . Liver cirrhosis secondary to NASH (nonalcoholic  steatohepatitis) (CMS/HHS-HCC)   . Organ transplant   . Overweight (BMI 25.0-29.9) 07/12/2012   Body mass index is 26.31 kg/(m^2).   . S/P kidney transplant (HHS-HCC) 11/07/2015  . S/P liver transplant (CMS/HHS-HCC) 11/07/2015  . Sleep apnea   . Squamous cell carcinoma of neck 2024    Patient Active Problem List  Diagnosis  . Type 2 diabetes mellitus, with long-term current use of insulin  (CMS/HHS-HCC)  . Colon polyp  . Immunosuppression (CMS/HHS-HCC)  . S/P liver  transplant (CMS/HHS-HCC)  . S/P kidney transplant (HHS-HCC)  . Need for prophylactic antibiotic  . Vitamin D  deficiency  . Essential hypertension  . Urinary retention with incomplete bladder emptying  . Hypomagnesemia  . Adjustment disorder with mixed disturbance of emotions and conduct  . Primary insomnia  . BK viremia  . Weight gain  . Aftercare following organ transplant  . Mineral metabolism disorder  . Constipation  . Obesity (BMI 30-39.9), unspecified  . Referred otalgia of right ear  . Musculoskeletal neck pain  . Temporomandibular joint dysfunction  . Dysfunction of right eustachian tube  . Moderate OSA. PLMS w/ arousal. (SPLIT-CMS 07/29/17: Overall AHI 29/hr, NREM AHI 28/hr, Lateral AHI 29/hr)  . Diarrhea due to drug  . Diabetes mellitus (CMS/HHS-HCC)  . Hypertension  . Right otitis media with effusion  . Mixed conductive and sensorineural hearing loss of both ears  . Carpal tunnel syndrome of right wrist  . OSA on CPAP  . Difficulty with CPAP use  . Subarachnoid hemorrhage (CMS/HHS-HCC)  . S/P laminectomy  . Sinus arrhythmia  . Hyponatremia  . Hypertensive urgency  . Hydrocephalus (CMS/HHS-HCC)  . Complete heart block, transient (CMS/HHS-HCC)  . Thrombocytopenia ()  . Multiple subsegmental pulmonary emboli without acute cor pulmonale (CMS/HHS-HCC)  . Influenza A  . DVT (deep venous thrombosis) (CMS/HHS-HCC)  . Acute cystitis without hematuria  . Pyelonephritis  . Pancreatic duct stones (HHS-HCC)  . Complicated urinary tract infection  . Pyelonephritis of transplanted kidney (HHS-HCC)  . AKI (acute kidney injury) ()  . Pyelonephritis, acute  . Limited mobility  . Vomiting and diarrhea  . Hypokalemia  . Acute lumbar back pain  . Lumbar pain  . Debility    Past Surgical History:  Procedure Laterality Date  . TRANSPLANT LIVER N/A 11/06/2015   Procedure: ADULT, LIVER ALLOTRANSPLANTATION; ORTHOTOPIC, PARTIAL OR WHOLE, FROM CADAVER OR LIVING DONOR;  Surgeon:  Lillia Noble, MD;  Location: [facility];  Service: General Surgery;  Laterality: N/A;  . TRANSPLANT KIDNEY N/A 11/06/2015   Procedure: RENAL ALLOTRANSPLANTATION, IMPLANTATION OF GRAFT; WITHOUT RECIPIENT NEPHRECTOMY;  Surgeon: Lillia Noble, MD;  Location: [facility];  Service: General Surgery;  Laterality: N/A;  . EXPLORATORY LAPAROTOMY N/A 11/12/2015   Procedure: EXPLORATORY LAPAROTOMY, EXPLORATORY CELIOTOMY WITH OR WITHOUT BIOPSY(S) (SEPARATE PROCEDURE) washout ;  Surgeon: Delena Lane Barton, MD;  Location: [facility];  Service: General Surgery;  Laterality: N/A;  . NEUROPLASTY &/OR TRANSPOSITION MEDIAN NERVE AT CARPAL TUNNEL Right 08/03/2018   Procedure: NEUROPLASTY AND/OR TRANSPOSITION; MEDIAN NERVE AT CARPAL TUNNEL;  Surgeon: Harry Alm Shaw, MD;  Location: [facility];  Service: Orthopedics;  Laterality: Right;  . MYRINGOTOMY & TUBES Right 04/11/2019   Procedure: PR CREATE EARDRUM OPENING,GEN ANESTH [30563 (CPT)] ;  Surgeon: Wonda Donnice Fallow, MD;  Location: [facility] OR;  Service: Otolaryngology Head and Neck;  Laterality: Right;  . LAMINECTOMY CERVICLE W/BIOPSY/EXCISION INTRADURAL INTRAMEDULLARY MASS N/A 08/18/2019   Procedure: LAMINECTOMY FOR BIOPSY/EXCISION OF INTRASPINAL NEOPLASM; INTRADURAL, INTRAMEDULLARY, CERVICAL;  Surgeon: Melonie,  Deatrice Mullet, MD;  Location: [facility];  Service: Neurosurgery;  Laterality: N/A;  Procedure: Suboccipital craniectomy and C1 laminectomy with cyst fenestration.  [place] w/ Pins, Microscope, C-Arm (1), Prone position, Neuromonitoring (EMG, MEP, SSEP), Ul  . MICROSURGERY N/A 08/18/2019   Procedure: MICROSURGICAL TECHNIQUES, REQUIRING USE OF OPERATING MICROSCOPE (LIST IN ADDITION TO PRIMARY PROCEDURE);  Surgeon: Melonie Deatrice Mullet, MD;  Location: [facility];  Service: Neurosurgery;  Laterality: N/A;  . CRANIECTOMY SUBOCCIPITAL W/CERVICLE LAMINECTOMY CHIARI MALFORMATION  N/A 08/18/2019   Procedure: CRANIECTOMY, SUBOCCIPITAL WITH CERVICAL LAMINECTOMY FOR DECOMPRESSION OF MEDULLA AND SPINAL CORD, WITH OR WITHOUT DURAL GRAFT (EG, ARNOLD-CHIARI MALFORMATION);  Surgeon: Melonie Deatrice Mullet, MD;  Location: [facility];  Service: Neurosurgery;  Laterality: N/A;  . CRANIECTOMY SUBOCCIPITAL Bilateral 08/22/2019   Procedure: CRANIECTOMY, SUBOCCIPITAL; FOR EXPLORATION OR DECOMPRESSION OF CRANIAL NERVES;  Surgeon: Melonie Deatrice Mullet, MD;  Location: [facility];  Service: Neurosurgery;  Laterality: Bilateral;  . TRACHEOSTOMY ADULT N/A 09/02/2019   Procedure: TRACHEOSTOMY, PLANNED (SEPARATE PROCEDURE);  Surgeon: Kern Elspeth SAILOR, MD;  Location: [facility] OR;  Service: General Surgery;  Laterality: N/A;  . INSERTION GASTROSTOMY TUBE PERCUTANEOUS N/A 09/02/2019   Procedure: INSERTION OF GASTROSTOMY TUBE, PERCUTANEOUS, UNDER FLUOROSCOPIC GUIDANCE INCLUDING CONTRAST INJECTION(S), IMAGE DOCUMENTATION AND REPORT;  Surgeon: Kern Elspeth SAILOR, MD;  Location: [facility] OR;  Service: General Surgery;  Laterality: N/A;  . CORI KAPUR EXAMINATION N/A 05/29/2023   Procedure: Upper Endoscopic Ultrasound;  Surgeon: Luella Sula Dance, MD;  Location: [facility] OR;  Service: Gastroenterology;  Laterality: N/A;  . COLONOSCOPY    . EGD    . LIVER BIOPSY      Outpatient Encounter Medications as of 01/05/2024  Medication Sig Dispense Refill  . acetaminophen  (TYLENOL ) 325 MG tablet Take 1-2 tablets (325-650 mg total) by mouth every 6 (six) hours as needed for Pain 30 tablet 0  . amLODIPine (NORVASC) 10 MG tablet Take 0.5 tablets (5 mg total) by mouth once daily    . apixaban (ELIQUIS) 5 mg tablet Take 1 tablet (5 mg total) by mouth every 12 (twelve) hours 180 tablet 3  . atorvastatin (LIPITOR) 20 MG tablet Take 1 tablet (20 mg total) by mouth every morning 30 tablet 0  . bismuth subsalicylate (PEPTO-BISMOL) 262 mg/15 mL suspension Take 30  mLs by mouth once daily as needed    . cholecalciferol (VITAMIN D3) 2,000 unit tablet Take 1 tablet (2,000 Units total) by mouth once daily 30 tablet 11  . DAPTOmycin  (CUBICIN ) injection (ADULT IVP ONLY) Inject 550 mg into the vein daily Pharmacy to determine diluent , concentration, and rate of administration.    . ferrous sulfate  325 (65 FE) MG tablet Take 1 tablet (325 mg total) by mouth every other day Take a lunch time. Separate from ciprofloxacin  by ~6 hours    . finasteride (PROSCAR) 5 mg tablet Take 1 tablet (5 mg total) by mouth once daily 90 tablet 0  . flash glucose sensor (FREESTYLE LIBRE 14 DAY SENSOR) kit Use 1 kit every 14 (fourteen) days for 39 days E11.29, E11.649. Freestyle libre 14 day sensor. 2 kit 11  . fluticasone  propionate (FLONASE ) 50 mcg/actuation nasal spray Place 2 sprays into both nostrils 2 (two) times daily as needed for Allergies or Rhinitis    . insulin  LISPRO PROTAMINE-LISPRO (HUMALOG KWIKPEN MIX 75/25) 100 unit/mL (75-25) pen injector 55 units with breakfast and 35 units with dinner    . magnesium  oxide (MAG-OX) 400 mg (241.3 mg magnesium ) tablet Take 1 tablet (  400 mg total) by mouth daily with lunch Separate from ciprofloxacin  by ~6 hours    . multivitamin 400 mcg tablet Take 1 tablet by mouth Daily after lunch Separate from ciprofloxacin  by ~6 hours    . ondansetron  (ZOFRAN ) 8 MG tablet Take 1 tablet (8 mg total) by mouth every 8 (eight) hours as needed for Nausea 20 tablet 0  . pantoprazole  (PROTONIX ) 40 MG DR tablet Take 1 tablet (40 mg total) by mouth once daily 30 tablet 0  . polyethylene glycol (MIRALAX ) powder Take 1 capful (17 g total) by mouth once daily. Mix in 4-8 ounces of fluid prior to taking. 238 g 0  . predniSONE  (DELTASONE ) 5 MG tablet Take 1 tablet (5 mg total) by mouth once daily 120 tablet 3  . pregabalin  (LYRICA ) 100 MG capsule Take 100 mg by mouth 2 (two) times daily as needed    . sennosides-docusate (SENOKOT-S) 8.6-50 mg tablet Take 2 tablets  by mouth 2 (two) times daily 120 tablet 0  . sertraline  (ZOLOFT ) 100 MG tablet Take 100 mg by mouth once daily    . sterile water  for injection 5 mLs by Other route as directed (for steroid reconstitution prior to administration.) 5 mL 12  . syringe with needle 3 mL 23 gauge x 1 1/2 Syrg Use 1 each as directed (for steroid administration) 1 each 12  . syringe with needle, safety 3 mL 25 gauge x 1 Syrg Use 1 each as directed (for steroid administration) 1 each 12  . tacrolimus  (ENVARSUS  XR) 0.75 mg extended-release tablet Take 2 tablets (1.5 mg total) by mouth every morning before breakfast 60 tablet 11  . tacrolimus  (ENVARSUS  XR) 1 mg extended-release tablet Take 1 tablet (1 mg total) by mouth every morning 30 tablet 0  . tamsulosin  (FLOMAX ) 0.4 mg capsule Take 2 capsules (0.8 mg total) by mouth once daily. Take 30 minutes after same meal each day. 60 capsule 0  . thiamine (VITAMIN B-1) 100 MG tablet Take 1 tablet (100 mg total) by mouth once daily 30 tablet 0  . traZODone  (DESYREL ) 50 MG tablet Take 1 tablet (50 mg total) by mouth at bedtime as needed for Sleep    . triamcinolone 0.025 % cream Apply topically once daily as needed     No facility-administered encounter medications on file as of 01/05/2024.    Allergies  Allergen Reactions  . Bactrim [Sulfamethoxazole-Trimethoprim] Vomiting    Made patient feel bad and had diarrhea  . Semaglutide Vomiting    Family History  Problem Relation Name Age of Onset  . GI problems Mother    . Diabetes type II Father Lynwood Huynh   . Heart disease Father Lynwood Leeb   . Cirrhosis Neg Hx    . HCC Neg Hx    . Liver disease Neg Hx    . Anesthesia problems Neg Hx    . Malignant hyperthermia Neg Hx      There is no family history of pancreatic or biliary disease other than what is listed above.  Social History   Tobacco Use  . Smoking status: Never  . Smokeless tobacco: Never  Vaping Use  . Vaping status: Never Used  Substance Use Topics   . Alcohol use: No  . Drug use: Never     Review of Systems: A ROS was performed including pertinent positive and negatives as documented in the HPI. All other systems are negative.   Physical Exam: Vitals:   01/05/24 1320  BP: 116/67  Pulse: 54  Resp: 16  Temp: 36.5 C (97.7 F)  TempSrc: Oral  SpO2: 99%  Weight: 90.7 kg (200 lb)  Height: 177.8 cm (5' 10)  PainSc:   5  PainLoc: Back   Body mass index is 28.7 kg/m. General: Mr.Duffey is a chronically ill appearing male in no acute distress at this time. Hunched over in wheel chair.  Skin: No appreciable jaundice. HEENT: Sclera anicteric Abdomen: obese, Soft, Nontender. Nondistended.  Neurological: The patient is alert and oriented x 3.   Review of Records: I personally reviewed the following data at the time of this encounter. Summarization of Outside Records reviewed via None.  Labs:  Recent Labs    12/13/23 0615 12/14/23 0553 12/15/23 0829 12/16/23 0848 12/17/23 0520  WBC 7.7 6.0 5.8 6.1 5.2  HGB 13.4* 13.3* 13.7 13.7 13.0*  HCT 39.8 39.4 41.8 40.8 39.9  MCV 81 80 82 81 83  PLT 101* 96* 94* 113* 103*   Recent Labs    09/04/23 1324 09/05/23 0833 09/06/23 0713 09/07/23 0612 09/08/23 0441 09/09/23 9376 09/10/23 0524 12/09/23 2339 12/11/23 0551 12/12/23 0518  AST 15 18 16  13* 15 14* 22 27 22 16   ALT 15 14* 14* 14* 13* 15 14* 22 26 18   TBILI 0.8 0.7 0.7 1.1 0.8 0.8 1.3 1.4 1.1 0.6  ALKPHOS 59 61 72 71 70 65 72 68 62 60    Recent Labs    01/08/23 0005 01/28/23 1318 05/11/23 1446 05/22/23 0114 07/14/23 1645 08/16/23 1801 09/02/23 1536 12/09/23 2339  LIPASE 54* 79* 77* 67* 50 52* 32 64*   Imaging Studies:  CT A/P 12/10/23 CT abdomen and pelvis with IV contrast   Comparison:  09/03/2023.   Indication:  abd pain, renal and liver transplant, R10.84 Generalized abdominal pain.   Technique:  CT imaging was performed of the abdomen and pelvis following the administration of intravenous  contrast.  Iodinated contrast was used due to the indications for the examination, to improve disease detection and further define anatomy. Coronal and sagittal reformatted images were generated and reviewed.   Findings:  - Lower Thorax: No suspicious pulmonary abnormalities. No pleural or pericardial effusions.   - Liver: Post liver transplant. No suspicious hepatic masses are identified.  The portal and hepatic veins are patent.    - Biliary and Gallbladder: No intrahepatic or extrahepatic bile duct dilatation. The gallbladder is surgically absent.   - Spleen: Normal in appearance.     - Pancreas: Dilated main pancreatic duct with an obstructing 0.8 cm stone in the head. Additional sequela of chronic pancreatitis including additional calcifications and gland atrophy.    - Adrenal Glands: Normal in appearance.    - Kidneys: Bilateral renal atrophy, including multifocal scarring and atrophy of the right lower quadrant transplant. No suspicious renal lesions. No hydronephrosis.   - Abdominal and Pelvic Vasculature: No abdominal aortic aneurysm.   - Gastrointestinal Tract: No abnormal dilation or wall thickening.   - Peritoneum/Mesentery/Retroperitoneum: No free fluid.  No free intraperitoneal air.   - Lymph Nodes: No retroperitoneal, mesenteric, pelvic, or inguinal lymphadenopathy.     - Bladder: Normal in appearance.   - Pelvic Organs: Foley catheter in place with bladder wall thickening and intraluminal air.   - Body Wall: Fat-containing umbilical hernia..   - Musculoskeletal:  No aggressive appearing osseous lesions.     Impression: 1.  Thick-walled bladder which may be seen in the setting of cystitis. Correlate with urinalysis. 2.  No other cause for abdominal  pain identified.  GI Procedures:        Assessment / Plan:   ICD-10-CM   1. Pancreatic duct stones (HHS-HCC)  K86.89     2. Dilation of pancreatic duct (HHS-HCC)  K86.89     3. Elevated  lipase  R74.8       TEJ MURDAUGH is a 73 y.o. 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 [facility] for f/u of pancreatic duct stones and dilated PD duct.   Reviewed his prior EUS and his most recent hospitalization imaging and labs. Stable PD stone and PD dilation likely from chronic pancreatitis that is not amendable to endoscopy. He is asymptomatic at this time so would not recommend any surgical intervention and likely is not a good surgical candidate. Recommend if he develops any upper abdominal pain to reach out to our team. In the interim recommend he continue to avoid any alcohol and stick to a low fat diet. Would recommend 6 month follow up visits to monitor his chronic pancreatitis.  Plan: -- PD stone not amendable to endoscopy. Do not recommend surgery at this time given asymptomatic -- avoid all alcohol and tobacco products. Stick to a low fat diet.  -- Consider nutrition labs with next visit -- follow up 6 months   I spent a total of 30 minutes in both face-to-face and non-face-to-face activities, excluding procedures performed, for this visit on the date of this encounter.   Attestation Statement:   I personally performed the service, non-incident to. (WP)    Willma Shams, NP Nurse Practitioner Division of Gastroenterology - [facility]   *Portions of this note were created using automated voice transcription software.

## 2024-01-12 NOTE — Telephone Encounter (Signed)
 11/23/2023 progress note sent to [facility].

## 2024-01-19 ENCOUNTER — Other Ambulatory Visit: Payer: Self-pay

## 2024-01-19 ENCOUNTER — Emergency Department (HOSPITAL_COMMUNITY)

## 2024-01-19 ENCOUNTER — Encounter (HOSPITAL_COMMUNITY): Payer: Self-pay | Admitting: Emergency Medicine

## 2024-01-19 ENCOUNTER — Inpatient Hospital Stay (HOSPITAL_COMMUNITY)
Admission: EM | Admit: 2024-01-19 | Discharge: 2024-02-05 | DRG: 871 | Disposition: E | Attending: Student | Admitting: Student

## 2024-01-19 DIAGNOSIS — A419 Sepsis, unspecified organism: Principal | ICD-10-CM | POA: Diagnosis present

## 2024-01-19 DIAGNOSIS — G9341 Metabolic encephalopathy: Secondary | ICD-10-CM | POA: Diagnosis present

## 2024-01-19 DIAGNOSIS — K219 Gastro-esophageal reflux disease without esophagitis: Secondary | ICD-10-CM | POA: Diagnosis present

## 2024-01-19 DIAGNOSIS — D84821 Immunodeficiency due to drugs: Secondary | ICD-10-CM | POA: Diagnosis present

## 2024-01-19 DIAGNOSIS — D638 Anemia in other chronic diseases classified elsewhere: Secondary | ICD-10-CM | POA: Diagnosis present

## 2024-01-19 DIAGNOSIS — K7200 Acute and subacute hepatic failure without coma: Secondary | ICD-10-CM | POA: Diagnosis present

## 2024-01-19 DIAGNOSIS — R6521 Severe sepsis with septic shock: Secondary | ICD-10-CM | POA: Diagnosis present

## 2024-01-19 DIAGNOSIS — Z7901 Long term (current) use of anticoagulants: Secondary | ICD-10-CM

## 2024-01-19 DIAGNOSIS — E1122 Type 2 diabetes mellitus with diabetic chronic kidney disease: Secondary | ICD-10-CM | POA: Diagnosis present

## 2024-01-19 DIAGNOSIS — Z7985 Long-term (current) use of injectable non-insulin antidiabetic drugs: Secondary | ICD-10-CM

## 2024-01-19 DIAGNOSIS — N319 Neuromuscular dysfunction of bladder, unspecified: Secondary | ICD-10-CM | POA: Diagnosis present

## 2024-01-19 DIAGNOSIS — Z66 Do not resuscitate: Secondary | ICD-10-CM | POA: Diagnosis present

## 2024-01-19 DIAGNOSIS — G4733 Obstructive sleep apnea (adult) (pediatric): Secondary | ICD-10-CM | POA: Diagnosis present

## 2024-01-19 DIAGNOSIS — E2740 Unspecified adrenocortical insufficiency: Secondary | ICD-10-CM | POA: Diagnosis present

## 2024-01-19 DIAGNOSIS — E1165 Type 2 diabetes mellitus with hyperglycemia: Secondary | ICD-10-CM | POA: Diagnosis present

## 2024-01-19 DIAGNOSIS — Z79624 Long term (current) use of inhibitors of nucleotide synthesis: Secondary | ICD-10-CM

## 2024-01-19 DIAGNOSIS — E1142 Type 2 diabetes mellitus with diabetic polyneuropathy: Secondary | ICD-10-CM | POA: Diagnosis present

## 2024-01-19 DIAGNOSIS — R001 Bradycardia, unspecified: Secondary | ICD-10-CM | POA: Diagnosis present

## 2024-01-19 DIAGNOSIS — D696 Thrombocytopenia, unspecified: Secondary | ICD-10-CM | POA: Diagnosis present

## 2024-01-19 DIAGNOSIS — N390 Urinary tract infection, site not specified: Secondary | ICD-10-CM | POA: Diagnosis present

## 2024-01-19 DIAGNOSIS — K7210 Chronic hepatic failure without coma: Secondary | ICD-10-CM | POA: Diagnosis present

## 2024-01-19 DIAGNOSIS — Z7401 Bed confinement status: Secondary | ICD-10-CM

## 2024-01-19 DIAGNOSIS — K7460 Unspecified cirrhosis of liver: Secondary | ICD-10-CM | POA: Diagnosis present

## 2024-01-19 DIAGNOSIS — N186 End stage renal disease: Secondary | ICD-10-CM | POA: Diagnosis present

## 2024-01-19 DIAGNOSIS — Z8249 Family history of ischemic heart disease and other diseases of the circulatory system: Secondary | ICD-10-CM

## 2024-01-19 DIAGNOSIS — E871 Hypo-osmolality and hyponatremia: Secondary | ICD-10-CM | POA: Diagnosis present

## 2024-01-19 DIAGNOSIS — Z794 Long term (current) use of insulin: Secondary | ICD-10-CM

## 2024-01-19 DIAGNOSIS — Z833 Family history of diabetes mellitus: Secondary | ICD-10-CM

## 2024-01-19 DIAGNOSIS — R5381 Other malaise: Secondary | ICD-10-CM | POA: Diagnosis present

## 2024-01-19 DIAGNOSIS — K7201 Acute and subacute hepatic failure with coma: Secondary | ICD-10-CM | POA: Diagnosis present

## 2024-01-19 DIAGNOSIS — Z8616 Personal history of COVID-19: Secondary | ICD-10-CM

## 2024-01-19 DIAGNOSIS — Z7952 Long term (current) use of systemic steroids: Secondary | ICD-10-CM

## 2024-01-19 DIAGNOSIS — Z8673 Personal history of transient ischemic attack (TIA), and cerebral infarction without residual deficits: Secondary | ICD-10-CM

## 2024-01-19 DIAGNOSIS — R7401 Elevation of levels of liver transaminase levels: Secondary | ICD-10-CM | POA: Diagnosis not present

## 2024-01-19 DIAGNOSIS — Z87440 Personal history of urinary (tract) infections: Secondary | ICD-10-CM

## 2024-01-19 DIAGNOSIS — Z515 Encounter for palliative care: Secondary | ICD-10-CM

## 2024-01-19 DIAGNOSIS — E872 Acidosis, unspecified: Secondary | ICD-10-CM | POA: Diagnosis present

## 2024-01-19 DIAGNOSIS — N179 Acute kidney failure, unspecified: Secondary | ICD-10-CM | POA: Diagnosis present

## 2024-01-19 DIAGNOSIS — Z79899 Other long term (current) drug therapy: Secondary | ICD-10-CM

## 2024-01-19 DIAGNOSIS — Z944 Liver transplant status: Secondary | ICD-10-CM

## 2024-01-19 DIAGNOSIS — Z993 Dependence on wheelchair: Secondary | ICD-10-CM

## 2024-01-19 DIAGNOSIS — I4891 Unspecified atrial fibrillation: Secondary | ICD-10-CM | POA: Diagnosis present

## 2024-01-19 DIAGNOSIS — J9601 Acute respiratory failure with hypoxia: Secondary | ICD-10-CM | POA: Diagnosis present

## 2024-01-19 DIAGNOSIS — Z1152 Encounter for screening for COVID-19: Secondary | ICD-10-CM

## 2024-01-19 DIAGNOSIS — K744 Secondary biliary cirrhosis: Secondary | ICD-10-CM | POA: Diagnosis present

## 2024-01-19 DIAGNOSIS — Z7982 Long term (current) use of aspirin: Secondary | ICD-10-CM

## 2024-01-19 DIAGNOSIS — K7581 Nonalcoholic steatohepatitis (NASH): Secondary | ICD-10-CM | POA: Diagnosis present

## 2024-01-19 DIAGNOSIS — Z87442 Personal history of urinary calculi: Secondary | ICD-10-CM

## 2024-01-19 DIAGNOSIS — Z86718 Personal history of other venous thrombosis and embolism: Secondary | ICD-10-CM

## 2024-01-19 DIAGNOSIS — K861 Other chronic pancreatitis: Secondary | ICD-10-CM | POA: Diagnosis present

## 2024-01-19 DIAGNOSIS — J150 Pneumonia due to Klebsiella pneumoniae: Secondary | ICD-10-CM | POA: Diagnosis present

## 2024-01-19 LAB — CBC WITH DIFFERENTIAL/PLATELET
Abs Immature Granulocytes: 0.04 K/uL (ref 0.00–0.07)
Basophils Absolute: 0 K/uL (ref 0.0–0.1)
Basophils Relative: 0 %
Eosinophils Absolute: 0 K/uL (ref 0.0–0.5)
Eosinophils Relative: 0 %
HCT: 49.4 % (ref 39.0–52.0)
Hemoglobin: 16.1 g/dL (ref 13.0–17.0)
Immature Granulocytes: 1 %
Lymphocytes Relative: 3 %
Lymphs Abs: 0.3 K/uL — ABNORMAL LOW (ref 0.7–4.0)
MCH: 26.5 pg (ref 26.0–34.0)
MCHC: 32.6 g/dL (ref 30.0–36.0)
MCV: 81.3 fL (ref 80.0–100.0)
Monocytes Absolute: 0.2 K/uL (ref 0.1–1.0)
Monocytes Relative: 2 %
Neutro Abs: 8.3 K/uL — ABNORMAL HIGH (ref 1.7–7.7)
Neutrophils Relative %: 94 %
Platelets: 144 K/uL — ABNORMAL LOW (ref 150–400)
RBC: 6.08 MIL/uL — ABNORMAL HIGH (ref 4.22–5.81)
RDW: 17 % — ABNORMAL HIGH (ref 11.5–15.5)
WBC: 8.8 K/uL (ref 4.0–10.5)
nRBC: 0 % (ref 0.0–0.2)

## 2024-01-19 LAB — COMPREHENSIVE METABOLIC PANEL WITH GFR
ALT: 558 U/L — ABNORMAL HIGH (ref 0–44)
AST: 788 U/L — ABNORMAL HIGH (ref 15–41)
Albumin: 3.6 g/dL (ref 3.5–5.0)
Alkaline Phosphatase: 558 U/L — ABNORMAL HIGH (ref 38–126)
Anion gap: 14 (ref 5–15)
BUN: 28 mg/dL — ABNORMAL HIGH (ref 8–23)
CO2: 23 mmol/L (ref 22–32)
Calcium: 9.6 mg/dL (ref 8.9–10.3)
Chloride: 101 mmol/L (ref 98–111)
Creatinine, Ser: 1.69 mg/dL — ABNORMAL HIGH (ref 0.61–1.24)
GFR, Estimated: 43 mL/min — ABNORMAL LOW (ref >60)
Glucose, Bld: 162 mg/dL — ABNORMAL HIGH (ref 70–99)
Potassium: 4.8 mmol/L (ref 3.5–5.1)
Sodium: 138 mmol/L (ref 135–145)
Total Bilirubin: 5.5 mg/dL — ABNORMAL HIGH (ref 0.0–1.2)
Total Protein: 7.7 g/dL (ref 6.5–8.1)

## 2024-01-19 LAB — PROTIME-INR
INR: 1.3 — ABNORMAL HIGH (ref 0.8–1.2)
Prothrombin Time: 16.4 s — ABNORMAL HIGH (ref 11.4–15.2)

## 2024-01-19 LAB — I-STAT CG4 LACTIC ACID, ED
Lactic Acid, Venous: 5 mmol/L (ref 0.5–1.9)
Lactic Acid, Venous: 5.7 mmol/L (ref 0.5–1.9)

## 2024-01-19 LAB — RESP PANEL BY RT-PCR (RSV, FLU A&B, COVID)  RVPGX2
Influenza A by PCR: NEGATIVE
Influenza B by PCR: NEGATIVE
Resp Syncytial Virus by PCR: NEGATIVE
SARS Coronavirus 2 by RT PCR: NEGATIVE

## 2024-01-19 MED ORDER — LACTATED RINGERS IV BOLUS (SEPSIS)
1000.0000 mL | Freq: Once | INTRAVENOUS | Status: AC
  Administered 2024-01-19: 1000 mL via INTRAVENOUS

## 2024-01-19 MED ORDER — METRONIDAZOLE 500 MG/100ML IV SOLN
500.0000 mg | Freq: Once | INTRAVENOUS | Status: AC
  Administered 2024-01-19: 500 mg via INTRAVENOUS
  Filled 2024-01-19: qty 100

## 2024-01-19 MED ORDER — LACTATED RINGERS IV BOLUS (SEPSIS)
1000.0000 mL | Freq: Once | INTRAVENOUS | Status: AC
Start: 2024-01-19 — End: 2024-01-20
  Administered 2024-01-19: 1000 mL via INTRAVENOUS

## 2024-01-19 MED ORDER — LACTATED RINGERS IV SOLN: INTRAVENOUS | Status: DC

## 2024-01-19 MED ORDER — LACTATED RINGERS IV BOLUS
1000.0000 mL | Freq: Once | INTRAVENOUS | Status: AC
  Administered 2024-01-20: 1000 mL via INTRAVENOUS

## 2024-01-19 MED ORDER — VANCOMYCIN HCL 2000 MG/400ML IV SOLN
2000.0000 mg | INTRAVENOUS | Status: AC
  Administered 2024-01-19: 2000 mg via INTRAVENOUS
  Filled 2024-01-19: qty 400

## 2024-01-19 MED ORDER — PIPERACILLIN-TAZOBACTAM 3.375 G IVPB
3.3750 g | Freq: Three times a day (TID) | INTRAVENOUS | Status: DC
  Administered 2024-01-20: 3.375 g via INTRAVENOUS
  Filled 2024-01-19: qty 50

## 2024-01-19 MED ORDER — VANCOMYCIN HCL IN DEXTROSE 1-5 GM/200ML-% IV SOLN: 1000.0000 mg | Freq: Once | INTRAVENOUS | Status: DC

## 2024-01-19 MED ORDER — SODIUM CHLORIDE 0.9 % IV SOLN
2.0000 g | Freq: Once | INTRAVENOUS | Status: AC
  Administered 2024-01-19: 2 g via INTRAVENOUS
  Filled 2024-01-19: qty 12.5

## 2024-01-19 MED ORDER — VANCOMYCIN HCL 1250 MG/250ML IV SOLN: 1250.0000 mg | INTRAVENOUS | Status: DC

## 2024-01-19 MED ORDER — ACETAMINOPHEN 500 MG PO TABS
500.0000 mg | ORAL_TABLET | Freq: Once | ORAL | Status: AC
  Administered 2024-01-19: 500 mg via ORAL
  Filled 2024-01-19: qty 1

## 2024-01-19 NOTE — Progress Notes (Signed)
 Elink monitoring for the code sepsis protocol.  0025  Notified provider of need to order repeat lactic acid.

## 2024-01-19 NOTE — Progress Notes (Addendum)
 Pharmacy Antibiotic Note  [name] is a 73 y.o. male admitted on 01/19/2024 with sepsis.  Pharmacy has been consulted for vancomycin  dosing.  Plan: Initiate vancomycin  1250mg  Q24h starting at 2200 on 7/16 Est AUC 487, est trough 13.0 Monitor CBC, Scr, and s/sx of infection resolution F/u on opportunity to deescalate as is appropriate   Height: 5' 10 (177.8 cm) Weight: 91.6 kg (202 lb) IBW/kg (Calculated) : 73  Temp (24hrs), Avg:100.3 F (37.9 C), Min:98.9 F (37.2 C), Max:101.7 F (38.7 C)  Recent Labs  Lab 01/19/24 2055 01/19/24 2106 01/19/24 2258  WBC 8.8  --   --   CREATININE 1.69*  --   --   LATICACIDVEN  --  5.0* 5.7*    Estimated Creatinine Clearance: 45.6 mL/min (A) (by C-G formula based on SCr of 1.69 mg/dL (H)).    No Known Allergies  Antimicrobials this admission: Zosyn  3.375 7/16 >> Cefepime  2g x1 7/15 Vancomycin  2000mg  x1 7/15   Dose adjustments this admission: N/a  Microbiology results: 7/15 BCx: pending   Thank you for allowing pharmacy to be a part of this patient's care.  Belvie Macintosh, PharmD Candidate 01/19/2024 11:24 PM

## 2024-01-19 NOTE — ED Triage Notes (Signed)
 Patient BIB EMS for evaluation of chest pain and abdominal pain. Onset around 2 PM today.  Having nausea/vomiting.  Emesis x 2.  Was clear per family.  No reports of blood in emesis or stool.  Has been treated for UTI recently.  From home.  Hx of liver and kidney tx.  Foley catheter in place.    BP 96/63 (normal for patient), 96 % RA, 98 HR.  CBG 188.  97.7 temp. 18 gauge R AC. Given ASA 324 mg PO and Zofran  4 mg IV.   EKG normal PTA.

## 2024-01-19 NOTE — ED Notes (Signed)
 Pt placed on 2L O2 via [place] for comfort, O2 sat NOW 94-95%. Triage RN notified.

## 2024-01-19 NOTE — ED Provider Notes (Addendum)
 [place] EMERGENCY DEPARTMENT AT [facility] Provider Note   CSN: [id number] Arrival date & time: 01/19/24  2014     Patient presents with: Chest Pain and Abdominal Pain   [name] is a 73 y.o. male.   This is a 73 year old male presents emergency room today due to feeling unwell, cough, abdominal pain and fever.  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  Patient is here with his wife helps provide history.  Patient been feeling well yesterday, however today was noted to be febrile, had an episode of emesis, seemed a bit confused.  Upon arrival to the emergency room, patient febrile, tachycardic and tachypneic.  Sees Dr. Buell for renal transplant.  Hospital course and discharge notes from previous hospitalization at [facility] this past month reviewed.   Chest Pain Associated symptoms: abdominal pain   Abdominal Pain Associated symptoms: chest pain        Prior to Admission medications   Medication Sig Start Date End Date Taking? Authorizing Provider  acetaminophen  (TYLENOL ) 325 MG tablet Take 650 mg by mouth every 8 (eight) hours as needed for mild pain or headache.  11/20/15   [provider]  albuterol  (VENTOLIN  HFA) 108 (90 Base) MCG/ACT inhaler Inhale 2 puffs into the lungs every 6 (six) hours as needed for wheezing or shortness of breath. 03/17/20   Singh, Prashant K, MD  aspirin  81 MG chewable tablet Chew 81 mg by mouth daily.    [provider]  azelastine (OPTIVAR) 0.05 % ophthalmic solution Place 1 drop into both eyes 2 (two) times daily as needed (for watery eyes).    [provider]  Continuous Blood Gluc Sensor (FREESTYLE LIBRE 14 DAY SENSOR) MISC Inject 1 Device into the skin every 14 (fourteen) days.    [provider]  ENVARSUS  XR 1 MG TB24 Take 2 mg by mouth daily before breakfast.    [provider]  ferrous sulfate  325 (65 FE) MG tablet Take 325 mg by mouth daily with breakfast.    [provider]  HUMALOG MIX 75/25 KWIKPEN (75-25) 100 UNIT/ML KwikPen Inject 30 Units into the skin 2 (two) times daily before a meal.    [provider]  mycophenolate  (CELLCEPT ) 500 MG tablet Take 500 mg by mouth 2 (two) times daily.    [provider]  ondansetron  (ZOFRAN -ODT) 4 MG disintegrating tablet Take 4 mg by mouth every 8 (eight) hours as needed for nausea or vomiting (dissolve orally).    [provider]  pantoprazole  (PROTONIX ) 40 MG tablet Take 40 mg by mouth daily before breakfast.    [provider]  polyethylene glycol powder (GLYCOLAX /MIRALAX ) 17 GM/SCOOP powder Take 17 g by mouth daily as needed for mild constipation (mix and drink).    [provider]  predniSONE  (DELTASONE ) 5 MG tablet Take 5 mg by mouth daily with breakfast. 01/01/16   [provider]  pregabalin  (LYRICA ) 100 MG capsule Take 100 mg by mouth 2 (two) times daily.    [provider]  pregabalin  (LYRICA ) 50 MG capsule TAKE 1 CAPSULE BY MOUTH IN THE MORNING, TAKE 1 CAPSULE MIDDAY AND 2 IN THE EVENING Patient not taking: Reported on 11/04/2021 01/26/19   Gayland Lauraine PARAS, NP  Semaglutide,0.25 or 0.5MG /DOS, (OZEMPIC, 0.25 OR 0.5 MG/DOSE,) 2 MG/3ML SOPN Inject 0.25  mg into the skin every Friday.    [provider]  sertraline  (ZOLOFT ) 100 MG tablet Take 100 mg by mouth daily.    [provider]  traZODone  (DESYREL ) 50 MG tablet Take 50 mg by mouth at bedtime. 02/10/20   [provider]    Allergies: Patient has no known allergies.    Review of Systems  Cardiovascular:  Positive for chest pain.  Gastrointestinal:  Positive for abdominal pain.    Updated Vital Signs BP 111/89   Pulse (!) 101   Temp 98.9 F (37.2 C) (Oral)   Resp  (!) 25   Ht 5' 10 (1.778 m)   Wt 91.6 kg   SpO2 100%   BMI 28.98 kg/m   Physical Exam Vitals and nursing note reviewed.  Constitutional:      Appearance: He is toxic-appearing.  HENT:     Head: Normocephalic.  Cardiovascular:     Rate and Rhythm: Regular rhythm. Tachycardia present.     Heart sounds: Normal heart sounds.  Chest:     Chest wall: No mass or tenderness.  Abdominal:     General: There is no abdominal bruit.     Palpations: Abdomen is soft. There is no hepatomegaly.  Genitourinary:    Comments: Indwelling Foley catheter, clear yellow urine in bag. Skin:    General: Skin is warm.     Findings: No rash.     Comments: No skin breakdown  Neurological:     General: No focal deficit present.     Mental Status: He is oriented to person, place, and time.     (all labs ordered are listed, but only abnormal results are displayed) Labs Reviewed  COMPREHENSIVE METABOLIC PANEL WITH GFR - Abnormal; Notable for the following components:      Result Value   Glucose, Bld 162 (*)    BUN 28 (*)    Creatinine, Ser 1.69 (*)    AST 788 (*)    ALT 558 (*)    Alkaline Phosphatase 558 (*)    Total Bilirubin 5.5 (*)    GFR, Estimated 43 (*)    All other components within normal limits  CBC WITH DIFFERENTIAL/PLATELET - Abnormal; Notable for the following components:   RBC 6.08 (*)    RDW 17.0 (*)    Platelets 144 (*)    Neutro Abs 8.3 (*)    Lymphs Abs 0.3 (*)    All other components within normal limits  PROTIME-INR - Abnormal; Notable for the following components:   Prothrombin Time 16.4 (*)    INR 1.3 (*)    All other components within normal limits  I-STAT CG4 LACTIC ACID, ED - Abnormal; Notable for the following components:   Lactic Acid, Venous 5.0 (*)    All other components within normal limits  I-STAT CG4 LACTIC ACID, ED - Abnormal; Notable for the following components:   Lactic Acid, Venous 5.7 (*)    All other components within normal limits  RESP PANEL BY  RT-PCR (RSV, FLU A&B, COVID)  RVPGX2  CULTURE, BLOOD (ROUTINE X 2)  CULTURE, BLOOD (ROUTINE X 2)  URINALYSIS, W/ REFLEX TO CULTURE (INFECTION SUSPECTED)    EKG: None  Radiology: CT CHEST ABDOMEN PELVIS WO CONTRAST Result Date: 01/19/2024 CLINICAL DATA:  Possible sepsis EXAM: CT CHEST, ABDOMEN AND PELVIS WITHOUT CONTRAST TECHNIQUE: Multidetector CT imaging of the chest, abdomen and pelvis was performed following the standard protocol without IV contrast. RADIATION DOSE REDUCTION: This exam was performed according to  the departmental dose-optimization program which includes automated exposure control, adjustment of the mA and/or kV according to patient size and/or use of iterative reconstruction technique. COMPARISON:  07/07/2022 FINDINGS: CT CHEST FINDINGS Cardiovascular: Somewhat limited due to lack of IV contrast. Atherosclerotic calcifications are noted without aneurysmal dilatation. Pulmonary artery is within normal limits. Heart is not significantly enlarged. Scattered coronary calcifications are noted. Mediastinum/Nodes: Thoracic inlet is within normal limits. No hilar or mediastinal adenopathy is noted. The esophagus as visualized is within normal limits. Lungs/Pleura: Lungs are well aerated bilaterally. Small less than 5 mm nodule is noted within the right upper lobe along the major fissure best seen on image number 67 series 5. No other sizable nodules are seen. No effusion is noted. Musculoskeletal: No acute rib abnormality is noted. Degenerative changes of the thoracic spine are seen. CT ABDOMEN PELVIS FINDINGS Hepatobiliary: No focal liver abnormality is seen. Status post cholecystectomy. No biliary dilatation. Pancreas: Scattered calcifications are noted throughout the pancreas consistent with chronic pancreatitis. Spleen: Normal in size without focal abnormality. Adrenals/Urinary Tract: Adrenal glands are within normal limits. Kidneys are well visualized. No obstructive changes are seen.  Small nonobstructing stone is noted in the left kidney. Simple cyst is noted in the lower pole of the right kidney. No follow-up is recommended. The bladder is decompressed by Foley catheter. Right iliac fossa renal transplant is noted similar to that seen on prior CT examination. Stomach/Bowel: No obstructive or inflammatory changes of the colon are seen. The appendix is within normal limits. Small bowel and stomach are unremarkable. Vascular/Lymphatic: Aortic atherosclerosis. No enlarged abdominal or pelvic lymph nodes. Reproductive: Prostate is unremarkable. Other: No free fluid is noted. Fat containing ventral hernia is seen stable from the prior exam. Musculoskeletal: No acute bony abnormality is noted. IMPRESSION: CT of the chest:Right solid pulmonary nodule within the upper lobe measuring 4 mm. Per Fleischner Society Guidelines, if patient is low risk for malignancy, no routine follow-up imaging is recommended. If patient is high risk for malignancy, a non-contrast Chest CT at 12 months is optional. If performed and the nodule is stable at 12 months, no further follow-up is recommended. These guidelines do not apply to immunocompromised patients and patients with cancer. Follow up in patients with significant comorbidities as clinically warranted. For lung cancer screening, adhere to Lung-RADS guidelines. Reference: Radiology. 2017; 284(1):228-43. No other focal abnormality is noted. CT of the abdomen and pelvis: Small nonobstructing left renal stone. Right renal transplant. Chronic changes as described above. Electronically Signed   By: Oneil Devonshire M.D.   On: 01/19/2024 21:26   DG Chest Port 1 View Result Date: 01/19/2024 CLINICAL DATA:  Questionable sepsis EXAM: PORTABLE CHEST 1 VIEW COMPARISON:  Chest x-ray 07/07/2022 FINDINGS: The heart size and mediastinal contours are within normal limits. Both lungs are clear. The visualized skeletal structures are unremarkable. There are surgical clips in the  right abdomen. IMPRESSION: No active disease. Electronically Signed   By: Greig Pique M.D.   On: 01/19/2024 21:18     Procedures   Medications Ordered in the ED  lactated ringers  infusion (has no administration in time range)  lactated ringers  bolus 1,000 mL (0 mLs Intravenous Stopped 01/19/24 2245)    And  lactated ringers  bolus 1,000 mL (1,000 mLs Intravenous New Bag/Given 01/19/24 2240)    And  lactated ringers  bolus 1,000 mL (1,000 mLs Intravenous New Bag/Given 01/19/24 2245)  vancomycin  (VANCOREADY) IVPB 2000 mg/400 mL (2,000 mg Intravenous New Bag/Given 01/19/24 2146)  ceFEPIme  (MAXIPIME ) 2 g  in sodium chloride  0.9 % 100 mL IVPB (0 g Intravenous Stopped 01/19/24 2234)  metroNIDAZOLE  (FLAGYL ) IVPB 500 mg (500 mg Intravenous New Bag/Given 01/19/24 2158)  acetaminophen  (TYLENOL ) tablet 500 mg (500 mg Oral Given 01/19/24 2237)                                    Medical Decision Making 73 year old male here today with fever, abdominal pain and cough.  Patient immunosuppressed.  Differential diagnosis includes sepsis, severe sepsis, septic shock, UTI, pneumonia, intra-abdominal infection.  Plan - with patient's transplant history, will obtain noncontrasted imaging of the patient's chest abdomen pelvis.  Believe his source likely is urine given his recurrent UTIs.  Will start the patient on broad-spectrum antibiotics.  Out of concern for fluid overload, will start the patient on 2 L of IV fluids and reassess.  Given the patient's septic presentation, which included lactic acid of 4.5, I have reached out to the [facility] transfer center given the patient's transplant status.  Reassessment 11 PM-patient's lactic acid remains high, however heart rate is come down nicely, fever improved.  Patient has been accepted to [facility] by Dr. Freddie.  Patient currently has a bed assigned, awaiting ride.  Repeated lactic acids ordered, antibiotics scheduled via pharmacy, signed out to overnight doctor.  Amount and/or  Complexity of Data Reviewed Labs: ordered. Radiology: ordered.  Risk OTC drugs. Prescription drug management. Decision regarding hospitalization.        Final diagnoses:  Sepsis, due to unspecified organism, unspecified whether acute organ dysfunction present [facility])    ED Discharge Orders     None          Mannie Fairy DASEN, DO 01/19/24 2305    Mannie Fairy T, DO 01/19/24 2312    Mannie Fairy T, DO 01/20/24 1537

## 2024-01-20 ENCOUNTER — Emergency Department (HOSPITAL_COMMUNITY)

## 2024-01-20 ENCOUNTER — Inpatient Hospital Stay (HOSPITAL_COMMUNITY)

## 2024-01-20 DIAGNOSIS — K7200 Acute and subacute hepatic failure without coma: Secondary | ICD-10-CM | POA: Diagnosis present

## 2024-01-20 DIAGNOSIS — N179 Acute kidney failure, unspecified: Secondary | ICD-10-CM

## 2024-01-20 DIAGNOSIS — K7210 Chronic hepatic failure without coma: Secondary | ICD-10-CM | POA: Diagnosis present

## 2024-01-20 DIAGNOSIS — E119 Type 2 diabetes mellitus without complications: Secondary | ICD-10-CM | POA: Diagnosis not present

## 2024-01-20 DIAGNOSIS — E1122 Type 2 diabetes mellitus with diabetic chronic kidney disease: Secondary | ICD-10-CM | POA: Diagnosis present

## 2024-01-20 DIAGNOSIS — Z8616 Personal history of COVID-19: Secondary | ICD-10-CM | POA: Diagnosis not present

## 2024-01-20 DIAGNOSIS — Z944 Liver transplant status: Secondary | ICD-10-CM | POA: Diagnosis not present

## 2024-01-20 DIAGNOSIS — Z66 Do not resuscitate: Secondary | ICD-10-CM | POA: Diagnosis present

## 2024-01-20 DIAGNOSIS — R578 Other shock: Secondary | ICD-10-CM

## 2024-01-20 DIAGNOSIS — N186 End stage renal disease: Secondary | ICD-10-CM | POA: Diagnosis present

## 2024-01-20 DIAGNOSIS — K7201 Acute and subacute hepatic failure with coma: Secondary | ICD-10-CM | POA: Diagnosis present

## 2024-01-20 DIAGNOSIS — E2740 Unspecified adrenocortical insufficiency: Secondary | ICD-10-CM | POA: Diagnosis present

## 2024-01-20 DIAGNOSIS — D84821 Immunodeficiency due to drugs: Secondary | ICD-10-CM | POA: Diagnosis present

## 2024-01-20 DIAGNOSIS — Z515 Encounter for palliative care: Secondary | ICD-10-CM | POA: Diagnosis not present

## 2024-01-20 DIAGNOSIS — K861 Other chronic pancreatitis: Secondary | ICD-10-CM | POA: Diagnosis present

## 2024-01-20 DIAGNOSIS — R7401 Elevation of levels of liver transaminase levels: Secondary | ICD-10-CM | POA: Diagnosis present

## 2024-01-20 DIAGNOSIS — N390 Urinary tract infection, site not specified: Secondary | ICD-10-CM | POA: Diagnosis present

## 2024-01-20 DIAGNOSIS — J150 Pneumonia due to Klebsiella pneumoniae: Secondary | ICD-10-CM | POA: Diagnosis present

## 2024-01-20 DIAGNOSIS — A419 Sepsis, unspecified organism: Secondary | ICD-10-CM | POA: Diagnosis present

## 2024-01-20 DIAGNOSIS — D638 Anemia in other chronic diseases classified elsewhere: Secondary | ICD-10-CM | POA: Diagnosis present

## 2024-01-20 DIAGNOSIS — E871 Hypo-osmolality and hyponatremia: Secondary | ICD-10-CM | POA: Diagnosis present

## 2024-01-20 DIAGNOSIS — G9341 Metabolic encephalopathy: Secondary | ICD-10-CM | POA: Diagnosis present

## 2024-01-20 DIAGNOSIS — J9601 Acute respiratory failure with hypoxia: Secondary | ICD-10-CM | POA: Diagnosis present

## 2024-01-20 DIAGNOSIS — D696 Thrombocytopenia, unspecified: Secondary | ICD-10-CM | POA: Diagnosis present

## 2024-01-20 DIAGNOSIS — R6521 Severe sepsis with septic shock: Secondary | ICD-10-CM | POA: Diagnosis present

## 2024-01-20 DIAGNOSIS — Z1152 Encounter for screening for COVID-19: Secondary | ICD-10-CM | POA: Diagnosis not present

## 2024-01-20 DIAGNOSIS — E878 Other disorders of electrolyte and fluid balance, not elsewhere classified: Secondary | ICD-10-CM | POA: Diagnosis not present

## 2024-01-20 DIAGNOSIS — E872 Acidosis, unspecified: Secondary | ICD-10-CM | POA: Diagnosis present

## 2024-01-20 LAB — I-STAT ARTERIAL BLOOD GAS, ED
Acid-base deficit: 9 mmol/L — ABNORMAL HIGH (ref 0.0–2.0)
Bicarbonate: 14.7 mmol/L — ABNORMAL LOW (ref 20.0–28.0)
Calcium, Ion: 1.19 mmol/L (ref 1.15–1.40)
HCT: 41 % (ref 39.0–52.0)
Hemoglobin: 13.9 g/dL (ref 13.0–17.0)
O2 Saturation: 90 %
Patient temperature: 98.9
Potassium: 4.3 mmol/L (ref 3.5–5.1)
Sodium: 137 mmol/L (ref 135–145)
TCO2: 16 mmol/L — ABNORMAL LOW (ref 22–32)
pCO2 arterial: 27.4 mmHg — ABNORMAL LOW (ref 32–48)
pH, Arterial: 7.338 — ABNORMAL LOW (ref 7.35–7.45)
pO2, Arterial: 61 mmHg — ABNORMAL LOW (ref 83–108)

## 2024-01-20 LAB — URINALYSIS, W/ REFLEX TO CULTURE (INFECTION SUSPECTED)
Bilirubin Urine: NEGATIVE
Glucose, UA: NEGATIVE mg/dL
Ketones, ur: NEGATIVE mg/dL
Nitrite: NEGATIVE
Protein, ur: 30 mg/dL — AB
Specific Gravity, Urine: 1.014 (ref 1.005–1.030)
pH: 5 (ref 5.0–8.0)

## 2024-01-20 LAB — BLOOD CULTURE ID PANEL (REFLEXED) - BCID2
A.calcoaceticus-baumannii: NOT DETECTED
A.calcoaceticus-baumannii: NOT DETECTED
Bacteroides fragilis: NOT DETECTED
Bacteroides fragilis: NOT DETECTED
CTX-M ESBL: NOT DETECTED
CTX-M ESBL: NOT DETECTED
Candida albicans: NOT DETECTED
Candida albicans: NOT DETECTED
Candida auris: NOT DETECTED
Candida auris: NOT DETECTED
Candida glabrata: NOT DETECTED
Candida glabrata: NOT DETECTED
Candida krusei: NOT DETECTED
Candida krusei: NOT DETECTED
Candida parapsilosis: NOT DETECTED
Candida parapsilosis: NOT DETECTED
Candida tropicalis: NOT DETECTED
Candida tropicalis: NOT DETECTED
Carbapenem resist OXA 48 LIKE: NOT DETECTED
Carbapenem resist OXA 48 LIKE: NOT DETECTED
Carbapenem resistance IMP: NOT DETECTED
Carbapenem resistance IMP: NOT DETECTED
Carbapenem resistance KPC: NOT DETECTED
Carbapenem resistance KPC: NOT DETECTED
Carbapenem resistance NDM: NOT DETECTED
Carbapenem resistance NDM: NOT DETECTED
Carbapenem resistance VIM: NOT DETECTED
Carbapenem resistance VIM: NOT DETECTED
Cryptococcus neoformans/gattii: NOT DETECTED
Cryptococcus neoformans/gattii: NOT DETECTED
Enterobacter cloacae complex: NOT DETECTED
Enterobacter cloacae complex: NOT DETECTED
Enterobacterales: DETECTED — AB
Enterobacterales: DETECTED — AB
Enterococcus Faecium: NOT DETECTED
Enterococcus Faecium: NOT DETECTED
Enterococcus faecalis: NOT DETECTED
Enterococcus faecalis: NOT DETECTED
Escherichia coli: NOT DETECTED
Escherichia coli: NOT DETECTED
Haemophilus influenzae: NOT DETECTED
Haemophilus influenzae: NOT DETECTED
Klebsiella aerogenes: NOT DETECTED
Klebsiella aerogenes: NOT DETECTED
Klebsiella oxytoca: NOT DETECTED
Klebsiella oxytoca: NOT DETECTED
Klebsiella pneumoniae: DETECTED — AB
Klebsiella pneumoniae: DETECTED — AB
Listeria monocytogenes: NOT DETECTED
Listeria monocytogenes: NOT DETECTED
Neisseria meningitidis: NOT DETECTED
Neisseria meningitidis: NOT DETECTED
Proteus species: NOT DETECTED
Proteus species: NOT DETECTED
Pseudomonas aeruginosa: NOT DETECTED
Pseudomonas aeruginosa: NOT DETECTED
Salmonella species: NOT DETECTED
Salmonella species: NOT DETECTED
Serratia marcescens: NOT DETECTED
Serratia marcescens: NOT DETECTED
Staphylococcus aureus (BCID): NOT DETECTED
Staphylococcus aureus (BCID): NOT DETECTED
Staphylococcus epidermidis: NOT DETECTED
Staphylococcus epidermidis: NOT DETECTED
Staphylococcus lugdunensis: NOT DETECTED
Staphylococcus lugdunensis: NOT DETECTED
Staphylococcus species: NOT DETECTED
Staphylococcus species: NOT DETECTED
Stenotrophomonas maltophilia: NOT DETECTED
Stenotrophomonas maltophilia: NOT DETECTED
Streptococcus agalactiae: NOT DETECTED
Streptococcus agalactiae: NOT DETECTED
Streptococcus pneumoniae: NOT DETECTED
Streptococcus pneumoniae: NOT DETECTED
Streptococcus pyogenes: NOT DETECTED
Streptococcus pyogenes: NOT DETECTED
Streptococcus species: NOT DETECTED
Streptococcus species: NOT DETECTED

## 2024-01-20 LAB — COMPREHENSIVE METABOLIC PANEL WITH GFR
ALT: 448 U/L — ABNORMAL HIGH (ref 0–44)
ALT: 390 U/L — ABNORMAL HIGH (ref 0–44)
AST: 478 U/L — ABNORMAL HIGH (ref 15–41)
AST: 363 U/L — ABNORMAL HIGH (ref 15–41)
Albumin: 2.8 g/dL — ABNORMAL LOW (ref 3.5–5.0)
Albumin: 2.5 g/dL — ABNORMAL LOW (ref 3.5–5.0)
Alkaline Phosphatase: 453 U/L — ABNORMAL HIGH (ref 38–126)
Alkaline Phosphatase: 386 U/L — ABNORMAL HIGH (ref 38–126)
Anion gap: 18 — ABNORMAL HIGH (ref 5–15)
Anion gap: 22 — ABNORMAL HIGH (ref 5–15)
BUN: 33 mg/dL — ABNORMAL HIGH (ref 8–23)
BUN: 37 mg/dL — ABNORMAL HIGH (ref 8–23)
CO2: 18 mmol/L — ABNORMAL LOW (ref 22–32)
CO2: 14 mmol/L — ABNORMAL LOW (ref 22–32)
Calcium: 8.7 mg/dL — ABNORMAL LOW (ref 8.9–10.3)
Calcium: 8.4 mg/dL — ABNORMAL LOW (ref 8.9–10.3)
Chloride: 98 mmol/L (ref 98–111)
Chloride: 98 mmol/L (ref 98–111)
Creatinine, Ser: 2.07 mg/dL — ABNORMAL HIGH (ref 0.61–1.24)
Creatinine, Ser: 2.21 mg/dL — ABNORMAL HIGH (ref 0.61–1.24)
GFR, Estimated: 34 mL/min — ABNORMAL LOW (ref >60)
GFR, Estimated: 31 mL/min — ABNORMAL LOW (ref >60)
Glucose, Bld: 149 mg/dL — ABNORMAL HIGH (ref 70–99)
Glucose, Bld: 221 mg/dL — ABNORMAL HIGH (ref 70–99)
Potassium: 4.2 mmol/L (ref 3.5–5.1)
Potassium: 4.5 mmol/L (ref 3.5–5.1)
Sodium: 134 mmol/L — ABNORMAL LOW (ref 135–145)
Sodium: 134 mmol/L — ABNORMAL LOW (ref 135–145)
Total Bilirubin: 6 mg/dL — ABNORMAL HIGH (ref 0.0–1.2)
Total Bilirubin: 6.8 mg/dL — ABNORMAL HIGH (ref 0.0–1.2)
Total Protein: 5.8 g/dL — ABNORMAL LOW (ref 6.5–8.1)
Total Protein: 5.3 g/dL — ABNORMAL LOW (ref 6.5–8.1)

## 2024-01-20 LAB — MAGNESIUM: Magnesium: 1.1 mg/dL — ABNORMAL LOW (ref 1.7–2.4)

## 2024-01-20 LAB — ECHOCARDIOGRAM COMPLETE
Area-P 1/2: 5.09 cm2
Height: 70 in
S' Lateral: 3 cm
Weight: 3232 [oz_av]

## 2024-01-20 LAB — CBC WITH DIFFERENTIAL/PLATELET
Abs Immature Granulocytes: 0 K/uL (ref 0.00–0.07)
Basophils Absolute: 0.2 K/uL — ABNORMAL HIGH (ref 0.0–0.1)
Basophils Relative: 1 %
Eosinophils Absolute: 0 K/uL (ref 0.0–0.5)
Eosinophils Relative: 0 %
HCT: 45.9 % (ref 39.0–52.0)
Hemoglobin: 14.7 g/dL (ref 13.0–17.0)
Lymphocytes Relative: 8 %
Lymphs Abs: 1.3 K/uL (ref 0.7–4.0)
MCH: 26.9 pg (ref 26.0–34.0)
MCHC: 32 g/dL (ref 30.0–36.0)
MCV: 83.9 fL (ref 80.0–100.0)
Monocytes Absolute: 0.3 K/uL (ref 0.1–1.0)
Monocytes Relative: 2 %
Neutro Abs: 14.2 K/uL — ABNORMAL HIGH (ref 1.7–7.7)
Neutrophils Relative %: 89 %
Platelets: 195 K/uL (ref 150–400)
RBC: 5.47 MIL/uL (ref 4.22–5.81)
RDW: 16.6 % — ABNORMAL HIGH (ref 11.5–15.5)
WBC: 16 K/uL — ABNORMAL HIGH (ref 4.0–10.5)
nRBC: 0.1 % (ref 0.0–0.2)
nRBC: 2 /100{WBCs} — ABNORMAL HIGH

## 2024-01-20 LAB — GLUCOSE, CAPILLARY
Glucose-Capillary: 273 mg/dL — ABNORMAL HIGH (ref 70–99)
Glucose-Capillary: 332 mg/dL — ABNORMAL HIGH (ref 70–99)
Glucose-Capillary: 353 mg/dL — ABNORMAL HIGH (ref 70–99)
Glucose-Capillary: 369 mg/dL — ABNORMAL HIGH (ref 70–99)
Glucose-Capillary: 370 mg/dL — ABNORMAL HIGH (ref 70–99)

## 2024-01-20 LAB — CBC
HCT: 42 % (ref 39.0–52.0)
Hemoglobin: 13.5 g/dL (ref 13.0–17.0)
MCH: 26.8 pg (ref 26.0–34.0)
MCHC: 32.1 g/dL (ref 30.0–36.0)
MCV: 83.3 fL (ref 80.0–100.0)
Platelets: 165 K/uL (ref 150–400)
RBC: 5.04 MIL/uL (ref 4.22–5.81)
RDW: 16.8 % — ABNORMAL HIGH (ref 11.5–15.5)
WBC: 39.5 K/uL — ABNORMAL HIGH (ref 4.0–10.5)
nRBC: 0.1 % (ref 0.0–0.2)

## 2024-01-20 LAB — CBG MONITORING, ED: Glucose-Capillary: 117 mg/dL — ABNORMAL HIGH (ref 70–99)

## 2024-01-20 LAB — BILIRUBIN, DIRECT: Bilirubin, Direct: 3.9 mg/dL — ABNORMAL HIGH (ref 0.0–0.2)

## 2024-01-20 LAB — AMMONIA: Ammonia: 41 umol/L — ABNORMAL HIGH (ref 9–35)

## 2024-01-20 LAB — PHOSPHORUS: Phosphorus: 3.1 mg/dL (ref 2.5–4.6)

## 2024-01-20 LAB — LACTIC ACID, PLASMA
Lactic Acid, Venous: 6.8 mmol/L (ref 0.5–1.9)
Lactic Acid, Venous: 6.7 mmol/L (ref 0.5–1.9)
Lactic Acid, Venous: 7.5 mmol/L (ref 0.5–1.9)

## 2024-01-20 LAB — HEMOGLOBIN A1C
Hgb A1c MFr Bld: 7.4 % — ABNORMAL HIGH (ref 4.8–5.6)
Mean Plasma Glucose: 165.68 mg/dL

## 2024-01-20 LAB — STREP PNEUMONIAE URINARY ANTIGEN: Strep Pneumo Urinary Antigen: NEGATIVE

## 2024-01-20 LAB — GAMMA GT: GGT: 503 U/L — ABNORMAL HIGH (ref 7–50)

## 2024-01-20 LAB — LIPASE, BLOOD: Lipase: 155 U/L — ABNORMAL HIGH (ref 11–51)

## 2024-01-20 LAB — MRSA NEXT GEN BY PCR, NASAL: MRSA by PCR Next Gen: DETECTED — AB

## 2024-01-20 MED ORDER — IPRATROPIUM-ALBUTEROL 0.5-2.5 (3) MG/3ML IN SOLN
3.0000 mL | Freq: Four times a day (QID) | RESPIRATORY_TRACT | Status: DC
  Administered 2024-01-20 – 2024-01-21 (×5): 3 mL via RESPIRATORY_TRACT
  Filled 2024-01-20 (×6): qty 3

## 2024-01-20 MED ORDER — ORAL CARE MOUTH RINSE
15.0000 mL | OROMUCOSAL | Status: DC
  Administered 2024-01-20 – 2024-01-24 (×14): 15 mL via OROMUCOSAL

## 2024-01-20 MED ORDER — INSULIN ASPART 100 UNIT/ML IJ SOLN
0.0000 [IU] | INTRAMUSCULAR | Status: DC
  Administered 2024-01-20: 11 [IU] via SUBCUTANEOUS
  Administered 2024-01-20 (×2): 15 [IU] via SUBCUTANEOUS
  Administered 2024-01-20: 8 [IU] via SUBCUTANEOUS
  Administered 2024-01-21 (×3): 5 [IU] via SUBCUTANEOUS
  Administered 2024-01-21: 3 [IU] via SUBCUTANEOUS
  Administered 2024-01-21: 5 [IU] via SUBCUTANEOUS
  Administered 2024-01-21: 8 [IU] via SUBCUTANEOUS
  Administered 2024-01-22 (×2): 3 [IU] via SUBCUTANEOUS

## 2024-01-20 MED ORDER — GERHARDT'S BUTT CREAM
TOPICAL_CREAM | Freq: Two times a day (BID) | CUTANEOUS | Status: DC
  Administered 2024-01-20 – 2024-01-21 (×3): 1 via TOPICAL
  Filled 2024-01-20: qty 60

## 2024-01-20 MED ORDER — CHLORHEXIDINE GLUCONATE CLOTH 2 % EX PADS
6.0000 | MEDICATED_PAD | Freq: Every day | CUTANEOUS | Status: DC
  Administered 2024-01-20 – 2024-01-24 (×4): 6 via TOPICAL

## 2024-01-20 MED ORDER — ORAL CARE MOUTH RINSE: 15.0000 mL | OROMUCOSAL | Status: DC | PRN

## 2024-01-20 MED ORDER — ACETAMINOPHEN 650 MG RE SUPP
650.0000 mg | Freq: Once | RECTAL | Status: AC
Start: 2024-01-20 — End: 2024-01-20
  Administered 2024-01-20: 650 mg via RECTAL
  Filled 2024-01-20: qty 1

## 2024-01-20 MED ORDER — VASOPRESSIN 20 UNITS/100 ML INFUSION FOR SHOCK: 0.0400 [IU]/min | INTRAVENOUS | Status: DC

## 2024-01-20 MED ORDER — SODIUM CHLORIDE 0.9 % IV SOLN
500.0000 mg | Freq: Every day | INTRAVENOUS | Status: DC
  Administered 2024-01-20: 500 mg via INTRAVENOUS
  Filled 2024-01-20: qty 5

## 2024-01-20 MED ORDER — IOHEXOL 350 MG/ML SOLN
75.0000 mL | Freq: Once | INTRAVENOUS | Status: AC | PRN
  Administered 2024-01-20: 75 mL via INTRAVENOUS

## 2024-01-20 MED ORDER — ENOXAPARIN SODIUM 40 MG/0.4ML IJ SOSY: 40.0000 mg | PREFILLED_SYRINGE | Freq: Every day | INTRAMUSCULAR | Status: DC

## 2024-01-20 MED ORDER — DOCUSATE SODIUM 100 MG PO CAPS: 100.0000 mg | ORAL_CAPSULE | Freq: Two times a day (BID) | ORAL | Status: DC | PRN

## 2024-01-20 MED ORDER — POLYETHYLENE GLYCOL 3350 17 G PO PACK: 17.0000 g | PACK | Freq: Every day | ORAL | Status: DC | PRN

## 2024-01-20 MED ORDER — ACETAMINOPHEN 650 MG RE SUPP
650.0000 mg | Freq: Four times a day (QID) | RECTAL | Status: DC | PRN
  Filled 2024-01-20: qty 1

## 2024-01-20 MED ORDER — NOREPINEPHRINE 4 MG/250ML-% IV SOLN
0.0000 ug/min | INTRAVENOUS | Status: DC
  Administered 2024-01-20: 23 ug/min via INTRAVENOUS
  Administered 2024-01-20: 35 ug/min via INTRAVENOUS
  Administered 2024-01-20: 2 ug/min via INTRAVENOUS
  Administered 2024-01-20: 25 ug/min via INTRAVENOUS
  Administered 2024-01-20: 40 ug/min via INTRAVENOUS
  Administered 2024-01-20: 15 ug/min via INTRAVENOUS
  Administered 2024-01-20 (×2): 27 ug/min via INTRAVENOUS
  Administered 2024-01-20: 40 ug/min via INTRAVENOUS
  Administered 2024-01-21: 7 ug/min via INTRAVENOUS
  Administered 2024-01-21: 10 ug/min via INTRAVENOUS
  Administered 2024-01-21: 7 ug/min via INTRAVENOUS
  Filled 2024-01-20: qty 500
  Filled 2024-01-20 (×4): qty 250
  Filled 2024-01-20: qty 500
  Filled 2024-01-20 (×6): qty 250

## 2024-01-20 MED ORDER — ONDANSETRON HCL 4 MG/2ML IJ SOLN
4.0000 mg | Freq: Once | INTRAMUSCULAR | Status: AC
  Administered 2024-01-20: 4 mg via INTRAVENOUS
  Filled 2024-01-20: qty 2

## 2024-01-20 MED ORDER — LINEZOLID 600 MG/300ML IV SOLN
600.0000 mg | Freq: Two times a day (BID) | INTRAVENOUS | Status: AC
  Administered 2024-01-20 – 2024-01-21 (×4): 600 mg via INTRAVENOUS
  Filled 2024-01-20 (×4): qty 300

## 2024-01-20 MED ORDER — ORAL CARE MOUTH RINSE
15.0000 mL | OROMUCOSAL | Status: DC | PRN
Start: 2024-01-20 — End: 2024-01-22

## 2024-01-20 MED ORDER — INSULIN GLARGINE-YFGN 100 UNIT/ML ~~LOC~~ SOLN
10.0000 [IU] | Freq: Two times a day (BID) | SUBCUTANEOUS | Status: DC
  Filled 2024-01-20: qty 0.1

## 2024-01-20 MED ORDER — ONDANSETRON HCL 4 MG/2ML IJ SOLN
INTRAMUSCULAR | Status: AC
  Filled 2024-01-20: qty 4

## 2024-01-20 MED ORDER — STERILE WATER FOR INJECTION IV SOLN
INTRAVENOUS | Status: AC
  Filled 2024-01-20 (×2): qty 1000

## 2024-01-20 MED ORDER — MAGNESIUM SULFATE 2 GM/50ML IV SOLN
2.0000 g | Freq: Once | INTRAVENOUS | Status: AC
  Administered 2024-01-20: 2 g via INTRAVENOUS
  Filled 2024-01-20: qty 50

## 2024-01-20 MED ORDER — MAGNESIUM SULFATE 4 GM/100ML IV SOLN
4.0000 g | Freq: Once | INTRAVENOUS | Status: AC
  Administered 2024-01-20: 4 g via INTRAVENOUS
  Filled 2024-01-20: qty 100

## 2024-01-20 MED ORDER — ACETAMINOPHEN 325 MG PO TABS: 650.0000 mg | ORAL_TABLET | Freq: Four times a day (QID) | ORAL | Status: DC | PRN

## 2024-01-20 MED ORDER — SODIUM CHLORIDE 0.9 % IV SOLN
2.0000 g | INTRAVENOUS | Status: DC
  Administered 2024-01-20 – 2024-01-21 (×2): 2 g via INTRAVENOUS
  Filled 2024-01-20 (×3): qty 20

## 2024-01-20 MED ORDER — SODIUM CHLORIDE 0.9 % IV SOLN
8.0000 mg | Freq: Once | INTRAVENOUS | Status: DC
Start: 2024-01-20 — End: 2024-01-20
  Filled 2024-01-20: qty 4

## 2024-01-20 MED ORDER — MUPIROCIN 2 % EX OINT
1.0000 | TOPICAL_OINTMENT | Freq: Two times a day (BID) | CUTANEOUS | Status: DC
  Administered 2024-01-20 – 2024-01-21 (×4): 1 via NASAL
  Filled 2024-01-20: qty 22

## 2024-01-20 MED ORDER — NALOXONE HCL 0.4 MG/ML IJ SOLN
INTRAMUSCULAR | Status: AC
  Administered 2024-01-20: 0.4 mg
  Filled 2024-01-20: qty 1

## 2024-01-20 MED ORDER — HYDROCORTISONE SOD SUC (PF) 100 MG IJ SOLR
50.0000 mg | Freq: Four times a day (QID) | INTRAMUSCULAR | Status: DC
  Administered 2024-01-20 – 2024-01-21 (×5): 50 mg via INTRAVENOUS
  Filled 2024-01-20 (×5): qty 2

## 2024-01-20 MED ORDER — FAMOTIDINE IN NACL 20-0.9 MG/50ML-% IV SOLN
20.0000 mg | Freq: Every day | INTRAVENOUS | Status: DC
  Administered 2024-01-20 – 2024-01-24 (×5): 20 mg via INTRAVENOUS
  Filled 2024-01-20 (×5): qty 50

## 2024-01-20 MED ORDER — ENOXAPARIN SODIUM 100 MG/ML IJ SOSY
90.0000 mg | PREFILLED_SYRINGE | Freq: Two times a day (BID) | INTRAMUSCULAR | Status: DC
  Administered 2024-01-20 – 2024-01-21 (×4): 90 mg via SUBCUTANEOUS
  Filled 2024-01-20 (×6): qty 0.9

## 2024-01-20 MED ORDER — LACTATED RINGERS IV SOLN: INTRAVENOUS | Status: AC

## 2024-01-20 NOTE — ED Notes (Signed)
 Pt now opening eyes for a couple seconds when his name is said

## 2024-01-20 NOTE — ED Notes (Signed)
 Rosan NP and Albustami MD at bedside with pt family to discuss plan of care

## 2024-01-20 NOTE — Progress Notes (Signed)
 RT called to bedside to place pt on BiPAP per ED physician, pt unable to pull mask off no reaction to sternal rub MD made aware of contraindications. MD still wanted to proceed with trying BiPAP at this time. Pt placed on BiPAP not tolerable high Mve and excessive leak due to sonorous respirations. MD and RN made aware pt is not tolerating at this time. RT placed pt back on NRB.

## 2024-01-20 NOTE — Progress Notes (Signed)
 01/20/2024 Labs are worse but plateau-ing Clinically more awake and pressor needs better He and family agreeable to see how night goes, do need to decide on better access if continues to have high pressor needs.

## 2024-01-20 NOTE — ED Notes (Signed)
 Hoffman NP at bedside to discuss plan of care with pt son now that he is here

## 2024-01-20 NOTE — ED Notes (Addendum)
 This RN noticed that pt O2 sat was 83%. Pt was then placed on 4L [place] and Dr.Palumbo was made aware of change in pt status. This RN went back to bedside after pt O2 sat was 86%. Pt was noted to be grunting, this RN tried to wake pt up, pt did not open his eyes. [name] was then pulled to bedside to assess pt with this RN. Dr.Palumbo was pulled to bedside since pt would not open his eyes on his own anymore. RT, [place] critical care MD and [facility] accepting MD paged.

## 2024-01-20 NOTE — Progress Notes (Signed)
 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  Patient is febrile to 103.3, tachycardic, mildly hypotensive despite norepinephrine  infusion, broad-spectrum antibiotics.  Results show anion gap metabolic acidosis, uptrending creatinine, resolving transaminitis, and leukocytosis.  Urinalysis from chronic Foley catheter grossly positive.  CT imaging reviewed showing evidence of right basilar opacities and renal/hepatic transplant  eICU Interventions  Continue broad-spectrum antibiotics  Maintain norepinephrine  as needed for MAP greater than 65.  Add vasopressin .  Add hydrocortisone  in the setting of chronic steroid use and relative adrenal insufficiency.  Continue to be fluid resuscitation  DVT prophylaxis with therapeutic enoxaparin  GI prophylaxis with famotidine      Intervention Category Evaluation Type: New Patient Evaluation  [name] 01/20/2024, 6:17 AM

## 2024-01-20 NOTE — Progress Notes (Signed)
 PHARMACY - PHYSICIAN COMMUNICATION CRITICAL VALUE ALERT - BLOOD CULTURE IDENTIFICATION (BCID)  [name] is an 73 y.o. male who presented to [facility] on 01/19/2024 with a chief complaint of malaise, abdominal pain and fever.   Assessment:  73 year old male admitted with hypotension, abdominal pain and fever. Has chronic foley. Found to have kleb pneumo in both sets of blood cultures. Noted originally had to have GPCs reported on one set of blood cultures as well but this was likely an error per microbiology lab.   Name of physician (or Provider) Contacted: 2H CCM team   Current antibiotics: vanc/zosyn /azith  Changes to prescribed antibiotics recommended:  Change to ceftriaxone  2 gm IV Q 24 hours   Results for orders placed or performed during the hospital encounter of 01/19/24  Blood Culture ID Panel (Reflexed) (Collected: 01/19/2024  8:46 PM)  Result Value Ref Range   Enterococcus faecalis NOT DETECTED NOT DETECTED   Enterococcus Faecium NOT DETECTED NOT DETECTED   Listeria monocytogenes NOT DETECTED NOT DETECTED   Staphylococcus species NOT DETECTED NOT DETECTED   Staphylococcus aureus (BCID) NOT DETECTED NOT DETECTED   Staphylococcus epidermidis NOT DETECTED NOT DETECTED   Staphylococcus lugdunensis NOT DETECTED NOT DETECTED   Streptococcus species NOT DETECTED NOT DETECTED   Streptococcus agalactiae NOT DETECTED NOT DETECTED   Streptococcus pneumoniae NOT DETECTED NOT DETECTED   Streptococcus pyogenes NOT DETECTED NOT DETECTED   A.calcoaceticus-baumannii NOT DETECTED NOT DETECTED   Bacteroides fragilis NOT DETECTED NOT DETECTED   Enterobacterales DETECTED (A) NOT DETECTED   Enterobacter cloacae complex NOT DETECTED NOT DETECTED   Escherichia coli NOT DETECTED NOT DETECTED   Klebsiella aerogenes NOT DETECTED NOT DETECTED   Klebsiella oxytoca NOT DETECTED NOT DETECTED   Klebsiella pneumoniae DETECTED (A) NOT DETECTED   Proteus species NOT DETECTED NOT DETECTED    Salmonella species NOT DETECTED NOT DETECTED   Serratia marcescens NOT DETECTED NOT DETECTED   Haemophilus influenzae NOT DETECTED NOT DETECTED   Neisseria meningitidis NOT DETECTED NOT DETECTED   Pseudomonas aeruginosa NOT DETECTED NOT DETECTED   Stenotrophomonas maltophilia NOT DETECTED NOT DETECTED   Candida albicans NOT DETECTED NOT DETECTED   Candida auris NOT DETECTED NOT DETECTED   Candida glabrata NOT DETECTED NOT DETECTED   Candida krusei NOT DETECTED NOT DETECTED   Candida parapsilosis NOT DETECTED NOT DETECTED   Candida tropicalis NOT DETECTED NOT DETECTED   Cryptococcus neoformans/gattii NOT DETECTED NOT DETECTED   CTX-M ESBL NOT DETECTED NOT DETECTED   Carbapenem resistance IMP NOT DETECTED NOT DETECTED   Carbapenem resistance KPC NOT DETECTED NOT DETECTED   Carbapenem resistance NDM NOT DETECTED NOT DETECTED   Carbapenem resist OXA 48 LIKE NOT DETECTED NOT DETECTED   Carbapenem resistance VIM NOT DETECTED NOT DETECTED    Damien Quiet, PharmD, BCPS, BCIDP Infectious Diseases Clinical Pharmacist Phone: [phone] 01/20/2024  9:57 AM

## 2024-01-20 NOTE — ED Provider Notes (Signed)
 Asked to see patient who has worsening hypotension and AMS.  Patient of [facility] s/p liver and kidney transplant here for sepsis.  Patient is in for a transfer to [facility].   Physical Exam  BP (!) 87/62   Pulse (!) 135   Temp 98.9 F (37.2 C) (Oral)   Resp (!) 34   Ht 5' 10 (1.778 m)   Wt 91.6 kg   SpO2 95%   BMI 28.98 kg/m   Physical Exam Vitals and nursing note reviewed.  Constitutional:      General: He is not in acute distress.    Appearance: He is well-developed. He is ill-appearing and toxic-appearing. He is not diaphoretic.  HENT:     Head: Normocephalic and atraumatic.     Nose: Nose normal.  Eyes:     Pupils: Pupils are equal, round, and reactive to light.  Cardiovascular:     Rate and Rhythm: Regular rhythm. Tachycardia present.     Pulses: Normal pulses.     Heart sounds: Normal heart sounds.  Pulmonary:     Effort: Pulmonary effort is normal. Tachypnea present.     Breath sounds: Normal breath sounds. No wheezing or rales.  Abdominal:     General: Bowel sounds are normal.     Palpations: Abdomen is soft.     Tenderness: There is no abdominal tenderness. There is no guarding or rebound.  Musculoskeletal:        General: Normal range of motion.     Cervical back: Normal range of motion and neck supple.  Skin:    General: Skin is warm and dry.     Capillary Refill: Capillary refill takes less than 2 seconds.  Neurological:     Deep Tendon Reflexes: Reflexes normal.     Procedures  .Critical Care  Performed by: Nettie Earing, MD Authorized by: Nettie Earing, MD   Critical care provider statement:    Critical care time (minutes):  60   Critical care end time:  01/20/2024 2:46 AM   Critical care was necessary to treat or prevent imminent or life-threatening deterioration of the following conditions:  Respiratory failure, hepatic failure and sepsis   Critical care was time spent personally by me on the following activities:  Development of treatment plan with  patient or surrogate, discussions with consultants, evaluation of patient's response to treatment, examination of patient, ordering and review of laboratory studies, ordering and review of radiographic studies, ordering and performing treatments and interventions, pulse oximetry, re-evaluation of patient's condition and review of old charts   I assumed direction of critical care for this patient from another provider in my specialty: yes   Comments:     Case d/w our ICU who will see the patient According to wife patient is a DNR DNI Patient is in for a step down bed at [facility], this is not an appropriate level of care.  Contacted [facility] to upgrade patient to the ICU 2:46 AM case d/w Dr. Freddie of [facility] ICU cannot accept at this time.  Will contact transplant.       ED Course / MDM    Medical Decision Making Amount and/or Complexity of Data Reviewed Labs: ordered. Radiology: ordered.  Risk OTC drugs. Prescription drug management. Decision regarding hospitalization.    Medications  piperacillin -tazobactam (ZOSYN ) IVPB 3.375 g (has no administration in time range)  vancomycin  (VANCOREADY) IVPB 1250 mg/250 mL (has no administration in time range)  norepinephrine  (LEVOPHED ) 4mg  in (0.016 mg/mL) premix infusion (2 mcg/min Intravenous  New Bag/Given 01/20/24 0204)  lactated ringers  bolus 1,000 mL (0 mLs Intravenous Stopped 01/19/24 2245)    And  lactated ringers  bolus 1,000 mL (0 mLs Intravenous Stopped 01/20/24 0001)    And  lactated ringers  bolus 1,000 mL (0 mLs Intravenous Stopped 01/20/24 0001)  ceFEPIme  (MAXIPIME ) 2 g in sodium chloride  0.9 % 100 mL IVPB (0 g Intravenous Stopped 01/19/24 2234)  metroNIDAZOLE  (FLAGYL ) IVPB 500 mg (0 mg Intravenous Stopped 01/19/24 2256)  vancomycin  (VANCOREADY) IVPB 2000 mg/400 mL (0 mg Intravenous Stopped 01/19/24 2358)  acetaminophen  (TYLENOL ) tablet 500 mg (500 mg Oral Given 01/19/24 2237)  lactated ringers  bolus 1,000 mL (1,000 mLs Intravenous New  Bag/Given 01/20/24 0007)  ondansetron  (ZOFRAN ) injection 4 mg (4 mg Intravenous Given 01/20/24 0006)  naloxone  (NARCAN ) 0.4 MG/ML injection (0.4 mg  Given 01/20/24 0215)         Laquan Ludden, MD 01/20/24 9751

## 2024-01-20 NOTE — ED Notes (Signed)
 Dr.Palumbo gave verbal order to this RN to put levo on max dose. Pump changed to 40mcg/min

## 2024-01-20 NOTE — ED Notes (Signed)
 Dr.Palumbo gave this RN verbal order for 8mg  zofran  to prevent pt from throwing up on bipap. Also gave this RN verbal for Narcan  IV one time dose.

## 2024-01-20 NOTE — Progress Notes (Signed)
 01/20/2024  Seen and examined Pressor needs a little better Wakes up briefly then falls asleep Ext warm Limited views of heart with echo looks grossly intact Malampatti 4  Kleb PNA BCID w/o resistance  Abx to ceftriaxone   Willing to try some BIPAP  Giving a liter of bicarb  Continue stress steroids, wean levo  As hemodynamics better, would give a little more time before we decide on comfort care, family agrees, remains DNR  If continues to improve and needs CVL will ask him and family if they are willing  Spoke with family at length  Rolan Sharps MD PCCM

## 2024-01-20 NOTE — H&P (Signed)
 NAME:  [name], MRN:  [id number], DOB:  1950-12-23, LOS: 0 ADMISSION DATE:  01/19/2024, CONSULTATION DATE:  7/16 REFERRING MD:  Dr. 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  He is followed for transplant care at [facility].  Bed bound at baseline following a spinal stroke in 2021. Recent course was complicated by recurrent UTIs secondary to chronic Foley catheter.  He was reportedly in his usual state of health 7/14, however, 7/15 he developed malaise, abdominal pain, and fever.  He was seen in the [facility] emergency department where he was found to be hypotensive which did improve with IV fluids.  He was felt to be septic secondary to UTI and due to his history of transplant at [facility] transfer was arranged, however, while he was awaiting transfer he became unresponsive.  PCCM was consulted.  Pertinent  Medical History   has a past medical history of Abscess in epidural space of cervical spine (08/13/2015), Acute hepatic encephalopathy (HCC), Acute renal failure superimposed on stage 3 chronic kidney disease (HCC), Anemia of chronic disease, Ascites, Bilateral carpal tunnel syndrome (01/27/2018), Bleeding esophageal varices (HCC), Cataract, Cervical disc disorder, Coagulopathy (HCC), Depression, Diabetes mellitus type II, controlled (HCC), Diabetic neuropathy (HCC), Dysphagia, Esophageal stricture, First degree AV block, GERD (gastroesophageal reflux disease), Gout, History of blood transfusion, History of COVID-19 (03/10/2020), History of kidney stones, Insomnia, NASH (nonalcoholic steatohepatitis), NASH (nonalcoholic steatohepatitis), OSA (obstructive sleep apnea), Peripheral neuropathy, Protein-calorie malnutrition, severe (HCC), Secondary  biliary cirrhosis (HCC), Stroke (HCC), Thrombocytopenia (HCC), Vitamin D  deficiency, and Wears glasses.   Significant Hospital Events: Including procedures, antibiotic start and stop dates in addition to other pertinent events   7/16  Interim History / Subjective:    Objective    Blood pressure (!) 87/62, pulse (!) 135, temperature 98.9 F (37.2 C), temperature source Oral, resp. rate (!) 34, height 5' 10 (1.778 m), weight 91.6 kg, SpO2 95%.        Intake/Output Summary (Last 24 hours) at 01/20/2024 0236 Last data filed at 01/20/2024 0001 Gross per 24 hour  Intake 600.84 ml  Output --  Net 600.84 ml   Filed Weights   01/19/24 2024  Weight: 91.6 kg    Examination: General: obese elderly gentleman. Pale. Critically ill appearing HENT: [place]/AT, PERRL, no JVD Lungs: Clear bilateral breath sounds Cardiovascular: RRR, no MRG Abdomen: Soft, NT, ND Extremities: thin lower extremities Neuro: Unresponsive to voice or touch. Facial grimace only to vigorous sternal rub only. Eyes open infrequently, but not to any stimulus.  GU: Chronic indwelling foley.   Resolved problem list   Assessment and Plan    Shock: presumably septic, although have not fully ruled out other etiologies ESRD now S/p Renal transplant AKI ESLD now S/p Liver transplant.  Elevated transaminases Hx Stroke in 2021 - wheelchair/bed bound status Chronic pancreatitis, pancreatic stone.  Recurrent UTI chronic foley catheter Lactic acidosis  Discussion: Patient presented to ED altered, but lucid. Quickly deteriorated. In shock. 40mcg NE currently. Unresponsive. No response to Narcan . Ammonia very mildly elevated. Per wife, the patient has been clear he would never want aggressive measure such as mechanical ventilation or other life sustaining measures. He has his signed goldenrod form at bedside. This is also consistent with his advanced directive stored  in the [facility]. When presented with the option of  aggressive measures vs comfort care, his wife [place] cal for comfort care. She requests that we continue current measures until she is able to get in touch with her son, who we have tried to call multiple times.   - Continue NE infusion, MAP goal 65 - Do not escalate care - Empiric zosyn  vancomycin  continue - Plan for transition to comfort care when family arrives - [place] is hoping he will be able to make it home with hospice   Best Practice (right click and Reselect all SmartList Selections daily)   Diet/type: NPO DVT prophylaxis LMWH Pressure ulcer(s): pressure ulcer assessment deferred  GI prophylaxis: N/A Lines: N/A Foley:  Yes, and it is still needed Code Status:  DNR Last date of multidisciplinary goals of care discussion [ See discussion above]  Labs   CBC: Recent Labs  Lab 01/19/24 2055  WBC 8.8  NEUTROABS 8.3*  HGB 16.1  HCT 49.4  MCV 81.3  PLT 144*    Basic Metabolic Panel: Recent Labs  Lab 01/19/24 2055  NA 138  K 4.8  CL 101  CO2 23  GLUCOSE 162*  BUN 28*  CREATININE 1.69*  CALCIUM  9.6   GFR: Estimated Creatinine Clearance: 45.6 mL/min (A) (by C-G formula based on SCr of 1.69 mg/dL (H)). Recent Labs  Lab 01/19/24 2055 01/19/24 2106 01/19/24 2258 01/20/24 0057  WBC 8.8  --   --   --   LATICACIDVEN  --  5.0* 5.7* 6.8*    Liver Function Tests: Recent Labs  Lab 01/19/24 2055  AST 788*  ALT 558*  ALKPHOS 558*  BILITOT 5.5*  PROT 7.7  ALBUMIN  3.6   No results for input(s): LIPASE, AMYLASE in the last 168 hours. No results for input(s): AMMONIA in the last 168 hours.  ABG    Component Value Date/Time   HCO3 17.0 (L) 03/10/2020 1058   TCO2 18 (L) 03/10/2020 1058   ACIDBASEDEF 6.0 (H) 03/10/2020 1058   O2SAT 93.0 03/10/2020 1058     Coagulation Profile: Recent Labs  Lab 01/19/24 2055  INR 1.3*    Cardiac Enzymes: No results for input(s): CKTOTAL, CKMB, CKMBINDEX, TROPONINI in the last 168  hours.  HbA1C: Hgb A1c MFr Bld  Date/Time Value Ref Range Status  11/04/2021 02:09 PM 7.4 (H) 4.8 - 5.6 % Final    Comment:    (NOTE) Pre diabetes:          5.7%-6.4%  Diabetes:              >6.4%  Glycemic control for   <7.0% adults with diabetes   03/10/2020 06:22 PM 6.0 (H) 4.8 - 5.6 % Final    Comment:    (NOTE) Pre diabetes:          5.7%-6.4%  Diabetes:              >6.4%  Glycemic control for   <7.0% adults with diabetes     CBG: Recent Labs  Lab 01/20/24 0203  GLUCAP 117*    Review of Systems:   Patient is encephalopathic and/or intubated; therefore, history has been obtained from chart review.    Past Medical History:  He,  has a past medical history of Abscess in epidural space of cervical spine (08/13/2015), Acute hepatic encephalopathy (HCC), Acute renal failure superimposed on stage 3 chronic kidney disease (HCC), Anemia of chronic disease, Ascites, Bilateral carpal tunnel syndrome (01/27/2018), Bleeding esophageal varices (HCC), Cataract,  Cervical disc disorder, Coagulopathy (HCC), Depression, Diabetes mellitus type II, controlled (HCC), Diabetic neuropathy (HCC), Dysphagia, Esophageal stricture, First degree AV block, GERD (gastroesophageal reflux disease), Gout, History of blood transfusion, History of COVID-19 (03/10/2020), History of kidney stones, Insomnia, NASH (nonalcoholic steatohepatitis), NASH (nonalcoholic steatohepatitis), OSA (obstructive sleep apnea), Peripheral neuropathy, Protein-calorie malnutrition, severe (HCC), Secondary biliary cirrhosis (HCC), Stroke (HCC), Thrombocytopenia (HCC), Vitamin D  deficiency, and Wears glasses.   Surgical History:   Past Surgical History:  Procedure Laterality Date   COLONOSCOPY     CYSTOSCOPY WITH STENT PLACEMENT Left 03/10/2020   Procedure: CYSTOSCOPY WITH RETROGRADE AND LEFT STENT PLACEMENT;  Surgeon: Nieves Cough, MD;  Location: [facility] OR;  Service: Urology;  Laterality: Left;    CYSTOSCOPY/URETEROSCOPY/HOLMIUM LASER/STENT PLACEMENT Left 04/03/2020   Procedure: CYSTOSCOPY/URETEROSCOPY/HOLMIUM LASER/STENT EXCHANGE;  Surgeon: Nieves Cough, MD;  Location: [facility] ORS;  Service: Urology;  Laterality: Left;   ESOPHAGEAL BANDING Bilateral 11/29/2013   Procedure: ESOPHAGEAL BANDING;  Surgeon: Norleen LOISE Kiang, MD;  Location: [facility] ENDOSCOPY;  Service: Endoscopy;  Laterality: Bilateral;   ESOPHAGEAL BANDING N/A 06/13/2014   Procedure: ESOPHAGEAL BANDING;  Surgeon: Norleen LOISE Kiang, MD;  Location: [facility] ENDOSCOPY;  Service: Endoscopy;  Laterality: N/A;   ESOPHAGOGASTRODUODENOSCOPY  08/01/2011   Procedure: ESOPHAGOGASTRODUODENOSCOPY (EGD);  Surgeon: Norleen Kiang, MD;  Location: [facility];  Service: Endoscopy;  Laterality: N/A;   ESOPHAGOGASTRODUODENOSCOPY N/A 10/27/2012   Procedure: ESOPHAGOGASTRODUODENOSCOPY (EGD);  Surgeon: Lamar JONETTA Aho, MD;  Location: [facility];  Service: Endoscopy;  Laterality: N/A;   ESOPHAGOGASTRODUODENOSCOPY N/A 11/22/2012   Procedure: ESOPHAGOGASTRODUODENOSCOPY (EGD);  Surgeon: Norleen LOISE Kiang, MD;  Location: [facility];  Service: Endoscopy;  Laterality: N/A;   ESOPHAGOGASTRODUODENOSCOPY N/A 11/29/2013   Procedure: ESOPHAGOGASTRODUODENOSCOPY (EGD);  Surgeon: Norleen LOISE Kiang, MD;  Location: [facility];  Service: Endoscopy;  Laterality: N/A;   ESOPHAGOGASTRODUODENOSCOPY N/A 06/13/2014   Procedure: ESOPHAGOGASTRODUODENOSCOPY (EGD);  Surgeon: Norleen LOISE Kiang, MD;  Location: [facility];  Service: Endoscopy;  Laterality: N/A;   KIDNEY STONE SURGERY  yrs ago   KIDNEY TRANSPLANT     LIVER TRANSPLANT  11/2015   UPPER GASTROINTESTINAL ENDOSCOPY       Social History:   reports that he has never smoked. He has never used smokeless tobacco. He reports that he does not drink alcohol and does not use drugs.   Family History:  His family history includes Aneurysm in his mother; Diabetes in his father and another family member; Heart disease in his father and another family member.  There is no history of Colon cancer, Anesthesia problems, Hypotension, Malignant hyperthermia, or Pseudochol deficiency.   Allergies No Known Allergies   Home Medications  Prior to Admission medications   Medication Sig Start Date End Date Taking? Authorizing Provider  acetaminophen  (TYLENOL ) 325 MG tablet Take 650 mg by mouth every 8 (eight) hours as needed for mild pain or headache.  11/20/15   [provider]  albuterol  (VENTOLIN  HFA) 108 (90 Base) MCG/ACT inhaler Inhale 2 puffs into the lungs every 6 (six) hours as needed for wheezing or shortness of breath. 03/17/20   Singh, Prashant K, MD  aspirin  81 MG chewable tablet Chew 81 mg by mouth daily.    [provider]  azelastine (OPTIVAR) 0.05 % ophthalmic solution Place 1 drop into both eyes 2 (two) times daily as needed (for watery eyes).    [provider]  Continuous Blood Gluc Sensor (FREESTYLE LIBRE 14 DAY SENSOR) MISC Inject 1 Device into the skin every 14 (fourteen) days.    [provider]  ENVARSUS  XR 1 MG TB24 Take 2 mg by mouth daily before breakfast.    [provider]  ferrous sulfate  325 (65 FE) MG tablet Take 325 mg by mouth daily with breakfast.    [provider]  HUMALOG MIX 75/25 KWIKPEN (75-25) 100 UNIT/ML KwikPen Inject 30 Units into the skin 2 (two) times daily before a meal.    [provider]  mycophenolate  (CELLCEPT ) 500 MG tablet Take 500 mg by mouth 2 (two) times daily.    [provider]  ondansetron  (ZOFRAN -ODT) 4 MG disintegrating tablet Take 4 mg by mouth every 8 (eight) hours as needed for nausea or vomiting (dissolve orally).    [provider]  pantoprazole  (PROTONIX ) 40 MG tablet Take 40 mg by mouth daily before breakfast.    [provider]  polyethylene glycol powder (GLYCOLAX /MIRALAX ) 17 GM/SCOOP powder Take 17 g by mouth daily as needed for mild constipation (mix and drink).    [provider]  predniSONE   (DELTASONE ) 5 MG tablet Take 5 mg by mouth daily with breakfast. 01/01/16   [provider]  pregabalin  (LYRICA ) 100 MG capsule Take 100 mg by mouth 2 (two) times daily.    [provider]  pregabalin  (LYRICA ) 50 MG capsule TAKE 1 CAPSULE BY MOUTH IN THE MORNING, TAKE 1 CAPSULE MIDDAY AND 2 IN THE EVENING Patient not taking: Reported on 11/04/2021 01/26/19   Gayland Lauraine PARAS, NP  Semaglutide,0.25 or 0.5MG /DOS, (OZEMPIC, 0.25 OR 0.5 MG/DOSE,) 2 MG/3ML SOPN Inject 0.25 mg into the skin every Friday.    [provider]  sertraline  (ZOLOFT ) 100 MG tablet Take 100 mg by mouth daily.    [provider]  traZODone  (DESYREL ) 50 MG tablet Take 50 mg by mouth at bedtime. 02/10/20   [provider]     Critical care time: 49 minutes     Deward Eastern, AGACNP-BC [facility]  See Amion for personal pager PCCM on call pager [phone] until 7pm. Please call Elink 7p-7a. [phone]  01/20/2024 4:14 AM

## 2024-01-20 NOTE — Plan of Care (Signed)
  Problem: Tissue Perfusion: Goal: Adequacy of tissue perfusion will improve Outcome: Progressing   Problem: Clinical Measurements: Goal: Respiratory complications will improve Outcome: Progressing Goal: Cardiovascular complication will be avoided Outcome: Progressing   Problem: Skin Integrity: Goal: Risk for impaired skin integrity will decrease Outcome: Progressing   Problem: Nutritional: Goal: Maintenance of adequate nutrition will improve Outcome: Not Progressing   Problem: Education: Goal: Knowledge of General Education information will improve Description: Including pain rating scale, medication(s)/side effects and non-pharmacologic comfort measures Outcome: Not Progressing

## 2024-01-20 NOTE — ED Notes (Addendum)
 This RN made [name] aware that pt rectal temp is now 103.3. No new orders given to this RN.

## 2024-01-21 DIAGNOSIS — K7200 Acute and subacute hepatic failure without coma: Secondary | ICD-10-CM

## 2024-01-21 DIAGNOSIS — N179 Acute kidney failure, unspecified: Secondary | ICD-10-CM | POA: Diagnosis not present

## 2024-01-21 DIAGNOSIS — A419 Sepsis, unspecified organism: Secondary | ICD-10-CM | POA: Diagnosis not present

## 2024-01-21 DIAGNOSIS — R6521 Severe sepsis with septic shock: Secondary | ICD-10-CM | POA: Diagnosis not present

## 2024-01-21 DIAGNOSIS — I4891 Unspecified atrial fibrillation: Secondary | ICD-10-CM

## 2024-01-21 LAB — GLUCOSE, CAPILLARY
Glucose-Capillary: 188 mg/dL — ABNORMAL HIGH (ref 70–99)
Glucose-Capillary: 220 mg/dL — ABNORMAL HIGH (ref 70–99)
Glucose-Capillary: 235 mg/dL — ABNORMAL HIGH (ref 70–99)
Glucose-Capillary: 242 mg/dL — ABNORMAL HIGH (ref 70–99)
Glucose-Capillary: 254 mg/dL — ABNORMAL HIGH (ref 70–99)
Glucose-Capillary: 288 mg/dL — ABNORMAL HIGH (ref 70–99)

## 2024-01-21 LAB — LEGIONELLA PNEUMOPHILA SEROGP 1 UR AG: L. pneumophila Serogp 1 Ur Ag: NEGATIVE

## 2024-01-21 LAB — BASIC METABOLIC PANEL WITH GFR
Anion gap: 20 — ABNORMAL HIGH (ref 5–15)
BUN: 39 mg/dL — ABNORMAL HIGH (ref 8–23)
CO2: 17 mmol/L — ABNORMAL LOW (ref 22–32)
Calcium: 8.1 mg/dL — ABNORMAL LOW (ref 8.9–10.3)
Chloride: 92 mmol/L — ABNORMAL LOW (ref 98–111)
Creatinine, Ser: 2.36 mg/dL — ABNORMAL HIGH (ref 0.61–1.24)
GFR, Estimated: 29 mL/min — ABNORMAL LOW (ref >60)
Glucose, Bld: 335 mg/dL — ABNORMAL HIGH (ref 70–99)
Potassium: 4.1 mmol/L (ref 3.5–5.1)
Sodium: 129 mmol/L — ABNORMAL LOW (ref 135–145)

## 2024-01-21 LAB — POCT I-STAT 7, (LYTES, BLD GAS, ICA,H+H)
Acid-base deficit: 6 mmol/L — ABNORMAL HIGH (ref 0.0–2.0)
Bicarbonate: 19.2 mmol/L — ABNORMAL LOW (ref 20.0–28.0)
Calcium, Ion: 1.02 mmol/L — ABNORMAL LOW (ref 1.15–1.40)
HCT: 36 % — ABNORMAL LOW (ref 39.0–52.0)
Hemoglobin: 12.2 g/dL — ABNORMAL LOW (ref 13.0–17.0)
O2 Saturation: 87 %
Patient temperature: 97.8
Potassium: 3.7 mmol/L (ref 3.5–5.1)
Sodium: 127 mmol/L — ABNORMAL LOW (ref 135–145)
TCO2: 20 mmol/L — ABNORMAL LOW (ref 22–32)
pCO2 arterial: 33.9 mmHg (ref 32–48)
pH, Arterial: 7.359 (ref 7.35–7.45)
pO2, Arterial: 54 mmHg — ABNORMAL LOW (ref 83–108)

## 2024-01-21 LAB — COMPREHENSIVE METABOLIC PANEL WITH GFR
ALT: 280 U/L — ABNORMAL HIGH (ref 0–44)
AST: 168 U/L — ABNORMAL HIGH (ref 15–41)
Albumin: 2.3 g/dL — ABNORMAL LOW (ref 3.5–5.0)
Alkaline Phosphatase: 279 U/L — ABNORMAL HIGH (ref 38–126)
Anion gap: 20 — ABNORMAL HIGH (ref 5–15)
BUN: 42 mg/dL — ABNORMAL HIGH (ref 8–23)
CO2: 19 mmol/L — ABNORMAL LOW (ref 22–32)
Calcium: 7.8 mg/dL — ABNORMAL LOW (ref 8.9–10.3)
Chloride: 89 mmol/L — ABNORMAL LOW (ref 98–111)
Creatinine, Ser: 2.5 mg/dL — ABNORMAL HIGH (ref 0.61–1.24)
GFR, Estimated: 27 mL/min — ABNORMAL LOW (ref >60)
Glucose, Bld: 252 mg/dL — ABNORMAL HIGH (ref 70–99)
Potassium: 3.9 mmol/L (ref 3.5–5.1)
Sodium: 128 mmol/L — ABNORMAL LOW (ref 135–145)
Total Bilirubin: 6.9 mg/dL — ABNORMAL HIGH (ref 0.0–1.2)
Total Protein: 5.5 g/dL — ABNORMAL LOW (ref 6.5–8.1)

## 2024-01-21 LAB — LACTIC ACID, PLASMA
Lactic Acid, Venous: 7.8 mmol/L (ref 0.5–1.9)
Lactic Acid, Venous: 6.7 mmol/L (ref 0.5–1.9)

## 2024-01-21 LAB — CBC
HCT: 41.8 % (ref 39.0–52.0)
Hemoglobin: 13.7 g/dL (ref 13.0–17.0)
MCH: 26.4 pg (ref 26.0–34.0)
MCHC: 32.8 g/dL (ref 30.0–36.0)
MCV: 80.7 fL (ref 80.0–100.0)
Platelets: 87 K/uL — ABNORMAL LOW (ref 150–400)
RBC: 5.18 MIL/uL (ref 4.22–5.81)
RDW: 16.8 % — ABNORMAL HIGH (ref 11.5–15.5)
WBC: 38 K/uL — ABNORMAL HIGH (ref 4.0–10.5)
nRBC: 0 % (ref 0.0–0.2)

## 2024-01-21 LAB — PHOSPHORUS: Phosphorus: 5.1 mg/dL — ABNORMAL HIGH (ref 2.5–4.6)

## 2024-01-21 LAB — AMMONIA: Ammonia: 26 umol/L (ref 9–35)

## 2024-01-21 LAB — MAGNESIUM: Magnesium: 2.5 mg/dL — ABNORMAL HIGH (ref 1.7–2.4)

## 2024-01-21 MED ORDER — INSULIN GLARGINE-YFGN 100 UNIT/ML ~~LOC~~ SOLN
10.0000 [IU] | Freq: Every day | SUBCUTANEOUS | Status: DC
  Administered 2024-01-21: 10 [IU] via SUBCUTANEOUS
  Filled 2024-01-21 (×2): qty 0.1

## 2024-01-21 MED ORDER — METHYLPREDNISOLONE SODIUM SUCC 40 MG IJ SOLR
20.0000 mg | Freq: Every day | INTRAMUSCULAR | Status: DC
  Administered 2024-01-21: 20 mg via INTRAVENOUS
  Filled 2024-01-21 (×2): qty 1

## 2024-01-21 MED ORDER — CALCIUM GLUCONATE-NACL 2-0.675 GM/100ML-% IV SOLN
2.0000 g | Freq: Once | INTRAVENOUS | Status: AC
  Administered 2024-01-21: 2000 mg via INTRAVENOUS
  Filled 2024-01-21: qty 100

## 2024-01-21 NOTE — Inpatient Diabetes Management (Addendum)
 Inpatient Diabetes Program Recommendations  AACE/ADA: New Consensus Statement on Inpatient Glycemic Control (2015)  Target Ranges:  Prepandial:   less than 140 mg/dL      Peak postprandial:   less than 180 mg/dL (1-2 hours)      Critically ill patients:  140 - 180 mg/dL   Lab Results  Component Value Date   GLUCAP 242 (H) 01/21/2024   HGBA1C 7.4 (H) 01/20/2024    Review of Glycemic Control  Latest Reference Range & Units 01/20/24 19:51 01/20/24 23:43 01/21/24 03:49 01/21/24 07:58  Glucose-Capillary 70 - 99 mg/dL 630 (H) 667 (H) 711 (H) 242 (H)   Diabetes history: DM2 Outpatient Diabetes medications:  Humalog 75/25- 55 units q AM and 35 units q PM Current orders for Inpatient glycemic control:  Novolog  0-15 units q 4 hours Hydrocortisone  50 mg IV q 12 hours Inpatient Diabetes Program Recommendations:   May consider adding Semglee  15 units bid.   Thanks,  Randall Bullocks, RN, BC-ADM Inpatient Diabetes Coordinator Pager [phone]  (8a-5p)

## 2024-01-21 NOTE — Progress Notes (Signed)
 eLink Physician-Brief Progress Note Patient Name: [name] DOB: Nov 16, 1950 MRN: [id number]   Date of Service  01/21/2024  HPI/Events of Note  LA 7.8 Plts 87  eICU Interventions  Given the persistent shock, this is somewhat expected.  Repeat labs at noon.  Improving pressor requirements.  Continue monitor     Intervention Category Minor Interventions: Routine modifications to care plan (e.g. PRN medications for pain, fever)  [name] 01/21/2024, 4:57 AM

## 2024-01-21 NOTE — Progress Notes (Signed)
 NAME:  [name], MRN:  [id number], DOB:  [birth year], LOS: 1 ADMISSION DATE:  01/19/2024, CONSULTATION DATE:  7/16 REFERRING MD:  Dr. 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  He is followed for transplant care at [facility].  Bed bound at baseline following a spinal stroke in 2021. Recent course was complicated by recurrent UTIs secondary to chronic Foley catheter.  He was reportedly in his usual state of health 7/14, however, 7/15 he developed malaise, abdominal pain, and fever.  He was seen in the [facility] emergency department where he was found to be hypotensive which did improve with IV fluids.  He was felt to be septic secondary to UTI and due to his history of transplant at [facility] transfer was arranged, however, while he was awaiting transfer he became unresponsive.  PCCM was consulted.  Pertinent  Medical History   has a past medical history of Abscess in epidural space of cervical spine (08/13/2015), Acute hepatic encephalopathy (HCC), Acute renal failure superimposed on stage 3 chronic kidney disease (HCC), Anemia of chronic disease, Ascites, Bilateral carpal tunnel syndrome (01/27/2018), Bleeding esophageal varices (HCC), Cataract, Cervical disc disorder, Coagulopathy (HCC), Depression, Diabetes mellitus type II, controlled (HCC), Diabetic neuropathy (HCC), Dysphagia, Esophageal stricture, First degree AV block, GERD (gastroesophageal reflux disease), Gout, History of blood transfusion, History of COVID-19 (03/10/2020), History of kidney stones, Insomnia, NASH (nonalcoholic steatohepatitis), NASH (nonalcoholic steatohepatitis), OSA (obstructive sleep apnea), Peripheral neuropathy, Protein-calorie malnutrition, severe (HCC), Secondary  biliary cirrhosis (HCC), Stroke (HCC), Thrombocytopenia (HCC), Vitamin D  deficiency, and Wears glasses.   Significant Hospital Events: Including procedures, antibiotic start and stop dates in addition to other pertinent events   7/16  Interim History / Subjective:  More alert yesterday with BIPAP now more somnolent and confused off of it. Levophed  down to 6 from 40 yesterday Making urine Family at bedside  Objective    Blood pressure (!) 90/58, pulse 79, temperature 97.8 F (36.6 C), temperature source Oral, resp. rate 20, height 5' 10 (1.778 m), weight 94.8 kg, SpO2 94%.    FiO2 (%):  [50 %] 50 %   Intake/Output Summary (Last 24 hours) at 01/21/2024 0948 Last data filed at 01/21/2024 0900 Gross per 24 hour  Intake 3081.01 ml  Output 1475 ml  Net 1606.01 ml   Filed Weights   01/19/24 2024 01/21/24 0351  Weight: 91.6 kg 94.8 kg    Examination: Chronically ill Tries to move arms to command and intermittently tracks LE paresis noted Minimal edema Lungs sound okay Urine more clear  Lactate up but stabilized WBC stable  Resolved problem list   Assessment and Plan    Septic shock due to recurrent UTI with bacteremia- MRSA a month ago now Kleb PNA; due to chronic foley need for spinal infarct; improving Immunocompromised, 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 Possible aspiration/PNA RLL- weaned to RA Hx OSA- query if leading to intermittent hypercapnea Encephalopathy- nonfocal neuro exam, came in comatose now waxing/waning c/w septic encephalopathy and/or critical illness delirium; will also look for CO2 retention Acute kidney injury Acute liver injury Lactic acidemia with delayed clearance due to ALI Afib on AC- on lovenox  here  - Continue ceftriaxone  - Check ABG, BIPAP PRN - Levo for MAP 65 - DC stress steroids, solumedrol  20, hold anti-rejection meds in context of lack of PO access and inability to tolerate PO (nor would family want feeding tube) - Continue  SSI and see how sugars settle out on lower steroids  GOC: BP improved, labs stabilized, mentation a bit worse; speaking with family he enjoys watching TV but has been getting recurrent admissions more frequently.  Son is understandably concerned as patient's wife's health is deteriorating caring for him 24/7.  Unfortunately they have not discussed EOL on prior admits.  Ideally would like to see patient turn around and make decision regarding GOC on his own.   Home with hospice services may or may not be enough support for him.  He adamantly does not want to go to SNF.  Will ask palliative to help family and patient through this decision.  If patient is still encephalopathic tomorrow and/or organ failure worse, recommend transition to inpatient comfort care.  Remains DNR  60 min cc time Rolan Sharps MD PCCM

## 2024-01-21 NOTE — Progress Notes (Signed)
 eLink Physician-Brief Progress Note Patient Name: [name] DOB: May 28, 1951 MRN: [id number]   Date of Service  01/21/2024  HPI/Events of Note   3 type 6/7 BMs today with no bowel reg to cause it, not On tube feeds, currently on broad-spectrum antibiotics  eICU Interventions  Does not meet criteria to test for C. Difficile Fever holding pharmacological therapy until clinical course is better defined     Intervention Category Minor Interventions: Routine modifications to care plan (e.g. PRN medications for pain, fever)  Trust Crago 01/21/2024, 8:45 PM

## 2024-01-21 NOTE — TOC Initial Note (Signed)
 Transition of Care [facility]) - Initial/Assessment Note    Patient Details  Name: [name] MRN: [id number] Date of Birth: 30-Oct-1950  Transition of Care [facility]) CM/SW Contact:    Sudie Erminio Deems, RN Phone Number: 01/21/2024, 11:47 AM  Clinical Narrative:  Patient presented for altered mental status. PTA patient was from home with spouse. Spouse was at the bedside during the visit. Patient has DME hospital bed, wheelchair, and lift. Patient is currently active with [facility] for RN/PT/OT. Patient will need home health orders if the plan continues to be home with home health services. Case Manager will continue to follow for additional needs.                Expected Discharge Plan: Home w Home Health Services Barriers to Discharge: Continued Medical Work up   Patient Goals and CMS Choice Patient states their goals for this hospitalization and ongoing recovery are:: Spouse is hopefull that the patient will return to home          Expected Discharge Plan and Services   Discharge Planning Services: CM Consult Post Acute Care Choice: Home Health, Resumption of Svcs/PTA Provider Living arrangements for the past 2 months: Single Family Home                   DME Agency: NA       HH Arranged: NA          Prior Living Arrangements/Services Living arrangements for the past 2 months: Single Family Home Lives with:: Spouse Patient language and need for interpreter reviewed:: Yes        Need for Family Participation in Patient Care: Yes (Comment) Care giver support system in place?: Yes (comment)   Criminal Activity/Legal Involvement Pertinent to Current Situation/Hospitalization: No - Comment as needed  Activities of Daily Living   ADL Screening (condition at time of admission) Independently performs ADLs?: No Does the patient have a NEW difficulty with bathing/dressing/toileting/self-feeding that is expected to last >3 days?: No Does the patient  have a NEW difficulty with getting in/out of bed, walking, or climbing stairs that is expected to last >3 days?: No Does the patient have a NEW difficulty with communication that is expected to last >3 days?: No Is the patient deaf or have difficulty hearing?: No Does the patient have difficulty seeing, even when wearing glasses/contacts?: Yes Does the patient have difficulty concentrating, remembering, or making decisions?: No  Permission Sought/Granted Permission sought to share information with : Family Supports, Case Automotive engineer granted to share info w AGENCY: [facility]        Emotional Assessment Appearance:: Appears stated age Attitude/Demeanor/Rapport: Unable to Assess Affect (typically observed): Unable to Assess   Alcohol / Substance Use: Not Applicable Psych Involvement: No (comment)  Admission diagnosis:  Elevated transaminase level [R74.01] Septic shock (HCC) [A41.9, R65.21] Sepsis, due to unspecified organism, unspecified whether acute organ dysfunction present [facility]) [A41.9] Patient Active Problem List   Diagnosis Date Noted   AKI (acute kidney injury) (HCC) 11/04/2021   Intractable nausea and vomiting 11/04/2021   GERD (gastroesophageal reflux disease) 11/04/2021   E. coli UTI (urinary tract infection)    Ureteral stone with hydronephrosis    COVID-19    Renal transplant recipient 03/12/2020   Liver transplanted (HCC) 03/12/2020   Sepsis secondary to UTI (HCC) 03/12/2020   Bacteremia due to Gram-negative bacteria 03/12/2020   Septic shock (HCC) 03/10/2020   Bilateral carpal  tunnel syndrome 01/27/2018   Secondary esophageal varices without bleeding (HCC)    Discitis of cervical region    Severe protein-calorie malnutrition (HCC)    Hypokalemia    Pancytopenia (HCC) 09/14/2015   Acidosis, metabolic 09/14/2015   Pressure ulcer 09/09/2015   Elevated lactic acid level    Palliative care encounter 08/28/2015   Hepatic  encephalopathy (HCC) 08/27/2015   Extradural and subdural abscess, 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/2015   Myelitis (HCC) 08/13/2015   Abscess in epidural space of cervical spine 08/13/2015   Secondary biliary cirrhosis (HCC)    Protein-calorie malnutrition, severe 07/26/2015   Ascites 07/25/2015   Acute renal failure superimposed on stage 3 chronic kidney disease (HCC) 07/25/2015   Anemia of chronic disease 07/25/2015   Depression 07/25/2015   Controlled type 2 diabetes mellitus with diabetic nephropathy, without long-term current use of insulin  (HCC) 07/25/2015   Diabetic neuropathy (HCC) 07/25/2015   Coagulopathy (HCC) 07/25/2015   NASH (nonalcoholic steatohepatitis) 07/25/2015   Body mass index (BMI) of 26.0-26.9 in adult 05/10/2015   Other ascites 04/25/2015   Acute hepatic encephalopathy (HCC) 01/30/2015   Thrombocytopenia (HCC) 10/22/2014   Bleeding esophageal varices (HCC) 10/27/2012   Hepatic cirrhosis (HCC) 07/12/2012   Obesity, unspecified 07/12/2012   Portal hypertension (HCC) 07/12/2012   Type 2 diabetes mellitus (HCC) 07/12/2012   ESOPHAGEAL STRICTURE 09/23/2004   PCP:  Jama Chow, MD Pharmacy:   [facility] [phone] - [place], [place] - [address] AT [facility] [address] [place] [address] Phone: [phone] Fax: [phone]  [facility] [address] [place] [address] Phone: [phone] Fax: [phone]     Social Drivers of Health (SDOH) Social History: SDOH Screenings   Food Insecurity: No Food Insecurity (01/20/2024)  Housing: Low Risk  (01/20/2024)  Transportation Needs: No Transportation Needs (01/20/2024)  Utilities: Not At Risk (01/20/2024)  Financial Resource Strain: Low Risk  (12/10/2023)   Received from [facility]  Social Connections: Unknown (01/20/2024)  Tobacco Use: Low Risk  (01/19/2024)   SDOH Interventions:     Readmission Risk Interventions     01/21/2024   11:45 AM  Readmission Risk Prevention Plan  Transportation Screening Complete  HRI or Home Care Consult Complete  Social Work Consult for Recovery Care Planning/Counseling Complete  Palliative Care Screening Not Applicable  Medication Review Oceanographer) Referral to Pharmacy

## 2024-01-22 DIAGNOSIS — R6521 Severe sepsis with septic shock: Secondary | ICD-10-CM | POA: Diagnosis not present

## 2024-01-22 DIAGNOSIS — D696 Thrombocytopenia, unspecified: Secondary | ICD-10-CM

## 2024-01-22 DIAGNOSIS — E878 Other disorders of electrolyte and fluid balance, not elsewhere classified: Secondary | ICD-10-CM | POA: Diagnosis not present

## 2024-01-22 DIAGNOSIS — N179 Acute kidney failure, unspecified: Secondary | ICD-10-CM | POA: Diagnosis not present

## 2024-01-22 DIAGNOSIS — A419 Sepsis, unspecified organism: Secondary | ICD-10-CM | POA: Diagnosis not present

## 2024-01-22 LAB — BASIC METABOLIC PANEL WITH GFR
Anion gap: 15 (ref 5–15)
BUN: 46 mg/dL — ABNORMAL HIGH (ref 8–23)
CO2: 20 mmol/L — ABNORMAL LOW (ref 22–32)
Calcium: 8 mg/dL — ABNORMAL LOW (ref 8.9–10.3)
Chloride: 93 mmol/L — ABNORMAL LOW (ref 98–111)
Creatinine, Ser: 2.91 mg/dL — ABNORMAL HIGH (ref 0.61–1.24)
GFR, Estimated: 22 mL/min — ABNORMAL LOW (ref >60)
Glucose, Bld: 209 mg/dL — ABNORMAL HIGH (ref 70–99)
Potassium: 4 mmol/L (ref 3.5–5.1)
Sodium: 128 mmol/L — ABNORMAL LOW (ref 135–145)

## 2024-01-22 LAB — CBC
HCT: 38 % — ABNORMAL LOW (ref 39.0–52.0)
Hemoglobin: 12.8 g/dL — ABNORMAL LOW (ref 13.0–17.0)
MCH: 26.6 pg (ref 26.0–34.0)
MCHC: 33.7 g/dL (ref 30.0–36.0)
MCV: 79 fL — ABNORMAL LOW (ref 80.0–100.0)
Platelets: 61 K/uL — ABNORMAL LOW (ref 150–400)
RBC: 4.81 MIL/uL (ref 4.22–5.81)
RDW: 16.8 % — ABNORMAL HIGH (ref 11.5–15.5)
WBC: 21.2 K/uL — ABNORMAL HIGH (ref 4.0–10.5)
nRBC: 0 % (ref 0.0–0.2)

## 2024-01-22 LAB — CULTURE, BLOOD (ROUTINE X 2)
Special Requests: ADEQUATE
Special Requests: ADEQUATE

## 2024-01-22 LAB — GLUCOSE, CAPILLARY
Glucose-Capillary: 197 mg/dL — ABNORMAL HIGH (ref 70–99)
Glucose-Capillary: 174 mg/dL — ABNORMAL HIGH (ref 70–99)

## 2024-01-22 LAB — PHOSPHORUS: Phosphorus: 5.5 mg/dL — ABNORMAL HIGH (ref 2.5–4.6)

## 2024-01-22 LAB — LACTIC ACID, PLASMA: Lactic Acid, Venous: 2.9 mmol/L (ref 0.5–1.9)

## 2024-01-22 LAB — MAGNESIUM: Magnesium: 2.5 mg/dL — ABNORMAL HIGH (ref 1.7–2.4)

## 2024-01-22 MED ORDER — MORPHINE 100MG IN NS 100ML (1MG/ML) PREMIX INFUSION
5.0000 mg/h | INTRAVENOUS | Status: DC
  Administered 2024-01-22 – 2024-01-24 (×3): 5 mg/h via INTRAVENOUS
  Filled 2024-01-22 (×5): qty 100

## 2024-01-22 MED ORDER — MORPHINE BOLUS VIA INFUSION: 1.0000 mg | INTRAVENOUS | Status: DC | PRN

## 2024-01-22 MED ORDER — MORPHINE SULFATE (PF) 2 MG/ML IV SOLN
2.0000 mg | INTRAVENOUS | Status: DC | PRN
  Administered 2024-01-22 (×2): 2 mg via INTRAVENOUS
  Filled 2024-01-22 (×2): qty 1

## 2024-01-22 MED ORDER — IPRATROPIUM-ALBUTEROL 0.5-2.5 (3) MG/3ML IN SOLN
3.0000 mL | RESPIRATORY_TRACT | Status: DC | PRN
  Administered 2024-01-22: 3 mL via RESPIRATORY_TRACT

## 2024-01-22 MED ORDER — ENOXAPARIN SODIUM 100 MG/ML IJ SOSY: 90.0000 mg | PREFILLED_SYRINGE | INTRAMUSCULAR | Status: DC

## 2024-01-22 MED ORDER — LORAZEPAM 2 MG/ML IJ SOLN: 2.0000 mg | INTRAMUSCULAR | Status: DC | PRN

## 2024-01-22 NOTE — Progress Notes (Signed)
 NAME:  [name], MRN:  [id number], DOB:  07-02-1951, LOS: 2 ADMISSION DATE:  01/19/2024, CONSULTATION DATE:  7/16 REFERRING MD:  Dr. 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  He is followed for transplant care at [facility].  Bed bound at baseline following a spinal stroke in 2021. Recent course was complicated by recurrent UTIs secondary to chronic Foley catheter.  He was reportedly in his usual state of health 7/14, however, 7/15 he developed malaise, abdominal pain, and fever.  He was seen in the [facility] emergency department where he was found to be hypotensive which did improve with IV fluids.  He was felt to be septic secondary to UTI and due to his history of transplant at [facility] transfer was arranged, however, while he was awaiting transfer he became unresponsive.  PCCM was consulted.  Pertinent  Medical History   has a past medical history of Abscess in epidural space of cervical spine (08/13/2015), Acute hepatic encephalopathy (HCC), Acute renal failure superimposed on stage 3 chronic kidney disease (HCC), Anemia of chronic disease, Ascites, Bilateral carpal tunnel syndrome (01/27/2018), Bleeding esophageal varices (HCC), Cataract, Cervical disc disorder, Coagulopathy (HCC), Depression, Diabetes mellitus type II, controlled (HCC), Diabetic neuropathy (HCC), Dysphagia, Esophageal stricture, First degree AV block, GERD (gastroesophageal reflux disease), Gout, History of blood transfusion, History of COVID-19 (03/10/2020), History of kidney stones, Insomnia, NASH (nonalcoholic steatohepatitis), NASH (nonalcoholic steatohepatitis), OSA (obstructive sleep apnea), Peripheral neuropathy, Protein-calorie malnutrition, severe (HCC), Secondary  biliary cirrhosis (HCC), Stroke (HCC), Thrombocytopenia (HCC), Vitamin D  deficiency, and Wears glasses.   Significant Hospital Events: Including procedures, antibiotic start and stop dates in addition to other pertinent events   7/16 admitted for septic shock, encephalopathy broad spec abx started 7/17 K PNA growing in blood 7/18 extensive goals of care discussion.  Patient DNR status now and transitioning to full comfort care  Interim History / Subjective:  Still on pressors.  Mental status worse.  Family at bedside  Objective    Blood pressure 108/72, pulse 70, temperature 97.6 F (36.4 C), temperature source Axillary, resp. rate 20, height 5' 10 (1.778 m), weight 93.7 kg, SpO2 95%.        Intake/Output Summary (Last 24 hours) at 01/22/2024 0943 Last data filed at 01/22/2024 0800 [place] per 24 hour  Intake 1328.97 ml  Output 985 ml  Net 343.97 ml   Filed Weights   01/21/24 0351 01/21/24 2100 01/22/24 0300  Weight: 94.8 kg 93.7 kg 93.7 kg    Examination:  General 73 year old chronically ill male patient lying in bed no acute distress but intermittently wincing and grimacing HEENT normocephalic atraumatic mucous membranes are moist no obvious JVD Pulmonary: Diminished bases some rhonchi no accessory use currently on nasal cannula Cardiac regular rate and rhythm Abdomen soft nontender Extremities warm trace lower extremity edema GU clear yellow Neuro: He is awake, moves upper extremities spontaneously but not to command, no movement of the lower extremities this is a chronic finding, makes incomprehensible words, appears uncomfortable at times Resolved problem list   Assessment and Plan    Septic shock w/ lactic acidosis and multiple organ failure due to recurrent UTI with Klebsiella PNA  bacteremia- in immunosuppressed pt ->still pressor dependent  Acute metabolic Encephalopathy-septic encephalopathy and/or critical AKI. W/  chronic foley and prior renal xplant: Scr  progressing. No hydro  Fluid and electrolyte imbalance. Progressive hyponatremia.  -he's over 3 liter pos since admit so don't think this is volume depletion. 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  I have discussed his current situation at length with his wife and son at bedside.  They have described to me a history over the last several years of frequent hospitalizations and last time at home.  They are deeply concerned about his comfort and dignity, and appropriately identified that even should he recover from this acute illness his likelihood of repeating this again in the near future is essentially a certainty.  Because of this and their commitment to his comfort they have agreed to transition to full comfort care.  I think they would be interested in a residential hospice situation however I do not see this happening over the weekend, I also suspect with him off blood pressure medications, adding pain medications, and no further titration of his care we will see him decline fairly rapidly  Plan Discontinue antibiotics Discontinue all lab work As needed morphine , will start at 2 mg every 2 hours as needed if requires more will escalate to drip As needed Ativan We will continue H2 blockade Transferred to private bed Full DNR Comfort care only  Family all in agreement My time  45 minutes

## 2024-01-22 NOTE — Plan of Care (Signed)
  Problem: Education: Goal: Ability to describe self-care measures that may prevent or decrease complications (Diabetes Survival Skills Education) will improve Outcome: Not Progressing Goal: Individualized Educational Video(s) Outcome: Not Progressing   Problem: Coping: Goal: Ability to adjust to condition or change in health will improve Outcome: Not Progressing   Problem: Fluid Volume: Goal: Ability to maintain a balanced intake and output will improve Outcome: Progressing

## 2024-01-22 NOTE — Progress Notes (Addendum)
 Palliative:  Consult received and chart reviewed. Call to son this AM - he was at bedside awaiting update from CCM. We discussed that I would come see them following update from CCM. Later, received update from CCM that goals were clear - transitioning to full comfort care. No outstanding needs for palliative medicine to address.   Please reach out if needs arise, we are happy to assist.  Tobey Jama Barnacle, DNP, [facility] Palliative Medicine Team Team Phone # [phone]  Pager # [phone]  NO CHARGE

## 2024-01-23 DIAGNOSIS — R6521 Severe sepsis with septic shock: Secondary | ICD-10-CM | POA: Diagnosis not present

## 2024-01-23 DIAGNOSIS — A419 Sepsis, unspecified organism: Secondary | ICD-10-CM | POA: Diagnosis not present

## 2024-01-23 NOTE — Plan of Care (Signed)

## 2024-01-23 NOTE — Progress Notes (Signed)
 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 He is followed for transplant care at [facility]. Bed bound at baseline following a spinal stroke in 2021. Recent course was complicated by recurrent UTIs secondary to chronic Foley catheter. He was reportedly in his usual state of health 7/14, however, 7/15 he developed malaise, abdominal pain, and fever. He was seen in the [facility] emergency department where he was found to be hypotensive which did improve with IV fluids. He was felt to be septic secondary to UTI and due to his history of transplant at [facility] transfer was arranged, however, while he was awaiting transfer he became unresponsive. PCCM was consulted.  Patient was admitted to the ICU for sepsis shock.  Course complicated by acute on chronic renal failure and worsening encephalopathy. Due to his multiple comorbidities and lack of improvement, family agreed to transition patient to comfort care on 7/18 to align his goals of care and advanced directive.  Assessment and Plan: Septic shock w/ lactic acidosis and multiple organ failure due to recurrent UTI with Klebsiella PNA  bacteremia- in immunosuppressed pt ->still pressor dependent  Acute metabolic Encephalopathy-septic encephalopathy and/or critical AKI. W/ chronic foley and prior renal xplant: Scr progressing. No hydro  Fluid and electrolyte imbalance. Progressive hyponatremia.  -he's over 3 liter pos since admit so don't think this is volume depletion. 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  I have discussed his current situation at length with his wife and son at bedside.  They have described to me a history over the last several years of frequent hospitalizations and last time at home.  They are deeply concerned about his comfort and dignity, and appropriately identified that even should he recover from this acute illness his likelihood of repeating this again in the near future is essentially a certainty.  Because of this and their commitment to his comfort they have agreed to transition to full comfort care.  I think they would be interested in a residential hospice situation however I do not see this happening over the weekend, I also suspect with him off blood pressure medications, adding pain medications, and no further titration of his care we will see him decline fairly rapidly.    Plan Antibiotics and all lab work discontinued As needed morphine , 2 mg every 2 hours as needed if requires more will escalate to drip As needed Ativan  We will continue H2 blockade Full DNR Comfort care only      Subjective: Evaluated at the bedside with spouse and son in the room. Comfortable appearing with intermittent increased work of breathing.  No acute concerns from family.  Discussed plan to continue comfort care in the hospital.  Family informed this timeframe to pass is likely hours to days.  Physical Exam: Vitals:  01/22/24 1800 01/22/24 2000 01/22/24 2100 01/23/24 0200  BP: (!) 87/55 (!) 90/55  (!) 83/41  Pulse: 68 71 73 97  Resp: (!) 9 19 11 11   Temp:      TempSrc:      SpO2: 98% 96% 96% (!) 85%  Weight:      Height:       General:  Chronically ill elderly male laying in bed. No acute distress. CV: Bradycardic.  Trace BLE edema. Pulmonary: On [place]. Lungs are clear with diminished lung sounds. Episodes of apnea.  Abdominal: Soft, mild distention.  Normal bowel sounds. Skin: Warm and dry.  Neuro: Eyes close, moves upper extremities spontaneously. Does not follow any commands.   Data Reviewed:  There are no new results to review at this time.  Family Communication: Discussed plan with spouse and son at bedside  Disposition: Status is: Inpatient Remains inpatient appropriate because: Inpatient comfort care  Planned Discharge Destination: Expected to expire in the hospital    Time spent: 25 minutes  Author: Claretta CHRISTELLA Alderman, MD 01/23/2024 8:26 AM  For on call review [email or web address].

## 2024-01-24 DIAGNOSIS — A419 Sepsis, unspecified organism: Secondary | ICD-10-CM | POA: Diagnosis not present

## 2024-01-24 DIAGNOSIS — R6521 Severe sepsis with septic shock: Secondary | ICD-10-CM | POA: Diagnosis not present

## 2024-01-29 ENCOUNTER — Encounter: Payer: Self-pay | Admitting: Student

## 2024-02-05 NOTE — Plan of Care (Signed)
  Problem: Skin Integrity: Goal: Risk for impaired skin integrity will decrease 02-02-24 0008 by Peri Magdalena LABOR, RN Outcome: Not Applicable 2024/02/02 0007 by Peri Magdalena LABOR, RN Outcome: Not Progressing   Problem: Coping: Goal: Level of anxiety will decrease Feb 02, 2024 0008 by Peri Magdalena LABOR, RN Outcome: Progressing February 02, 2024 0007 by Peri Magdalena LABOR, RN Outcome: Not Progressing   Problem: Pain Managment: Goal: General experience of comfort will improve and/or be controlled 2024/02/02 0008 by Peri Magdalena LABOR, RN Outcome: Progressing 02/02/2024 0007 by Peri Magdalena LABOR, RN Outcome: Not Progressing   Problem: Safety: Goal: Ability to remain free from injury will improve 02-Feb-2024 0008 by Peri Magdalena LABOR, RN Outcome: Progressing 2024-02-02 0007 by Peri Magdalena LABOR, RN Outcome: Not Progressing

## 2024-02-05 NOTE — Progress Notes (Signed)
 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 He is followed for transplant care at [facility]. Bed bound at baseline following a spinal stroke in 2021. Recent course was complicated by recurrent UTIs secondary to chronic Foley catheter. He was reportedly in his usual state of health 7/14, however, 7/15 he developed malaise, abdominal pain, and fever. He was seen in the [facility] emergency department where he was found to be hypotensive which did improve with IV fluids. He was felt to be septic secondary to UTI and due to his history of transplant at [facility] transfer was arranged, however, while he was awaiting transfer he became unresponsive. PCCM was consulted.  Patient was admitted to the ICU for sepsis shock.  Course complicated by acute on chronic renal failure and worsening encephalopathy. Due to his multiple comorbidities and lack of improvement, family agreed to transition patient to comfort care on 7/18 to align his goals of care and advanced directive.  Assessment and Plan: Septic shock w/ lactic acidosis and multiple organ failure due to recurrent UTI with Klebsiella PNA  bacteremia- in immunosuppressed pt ->still pressor dependent  Acute metabolic Encephalopathy-septic encephalopathy and/or critical AKI. W/ chronic foley and prior renal xplant: Scr progressing. No hydro  Fluid and electrolyte imbalance. Progressive hyponatremia.  -he's over 3 liter pos since admit so don't think this is volume depletion. 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  I have discussed his current situation at length with his wife and son at bedside.  They have described to me a history over the last several years of frequent hospitalizations and last time at home.  They are deeply concerned about his comfort and dignity, and appropriately identified that even should he recover from this acute illness his likelihood of repeating this again in the near future is essentially a certainty.  Because of this and their commitment to his comfort they have agreed to transition to full comfort care.  I think they would be interested in a residential hospice situation however I do not see this happening over the weekend, I also suspect with him off blood pressure medications, adding pain medications, and no further titration of his care we will see him decline fairly rapidly.    Plan Antibiotics and all lab work discontinued Continue morphine  drip and as needed boluses As needed Ativan  We will continue H2 blockade Full DNR Comfort care only      Subjective: Evaluated with spouse and son at bedside. Patient comfortable on nasal cannula but has intermittent apneic episodes.  Discussed the dying process with son and spouse and informed them that while he is likely to pass in the hospital in the next hours to days, we do not know the exact time that he will pass.  Encouraged them to continue to  spend time with him.   Physical Exam: Vitals:   01/22/24 2100 01/23/24 0200 01/23/24 0851 18-Feb-2024 0911  BP:  (!) 83/41 (!) 79/44 (!) 63/37  Pulse: 73 97 81 76  Resp: 11 11 19 18   Temp:   97.6 F (36.4 C) 98.1 F (36.7 C)  TempSrc:   Axillary    SpO2: 96% (!) 85% (!) 74% (!) 74%  Weight:      Height:       General: Chronically ill elderly male laying in bed. No acute distress. CV: Bradycardic. Trace BLE edema. Pulmonary: On [place]. Lungs are clear with diminished lung sounds. Episodes of apnea.  Abdominal: Soft, mild distention. Hypoactive bowel sounds Skin: Cool extremities. Neuro: Comfortable appearing with eyes close. No purposeful movement.  Data Reviewed:  There are no new results to review at this time.  Family Communication: Discussed the dying process with spouse and son at bedside  Disposition: Status is: Inpatient Remains inpatient appropriate because: Inpatient comfort care  Planned Discharge Destination: Expected to expire in the hospital    Time spent: 25 minutes  Author: Claretta CHRISTELLA Alderman, MD 2024/02/18 9:58 AM  For on call review [email or web address].

## 2024-02-05 NOTE — Progress Notes (Addendum)
 At 18:05 patient family came to nurses station and stated that patient has passed away. RN went to patients room and assessed patient. RN notified Lou Claretta BATTLE MD. MD gave orders for RN to call time of death. Keturah Marina RN and Donald Maes RN called patients time of death at 18:12. Left PIV removed. Foley catheter removed. Patients family was in the room. RN contacted [facility] [place] and spoke with Tiffany. [facility] was notified. Report given to oncoming nurse.

## 2024-02-05 NOTE — Death Summary Note (Signed)
 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 He is followed for transplant care at [facility]. Bed bound at baseline following a spinal stroke in 02/27/2020. Recent course was complicated by recurrent UTIs secondary to chronic Foley catheter. He was reportedly in his usual state of health 7/14, however, 02-12-2024 he developed malaise, abdominal pain, and fever. He was seen in the [facility] emergency department where he was found to be hypotensive which did improve with IV fluids. He was felt to be septic secondary to UTI and due to his history of transplant at [facility] transfer was arranged, however, while he was awaiting transfer he became unresponsive. PCCM was consulted. Patient was admitted to the ICU for sepsis shock. Course complicated by acute on chronic renal failure and worsening encephalopathy. Due to his multiple comorbidities and lack of improvement, family agreed to transition patient to comfort care on 7/18 to align his goals of care and advanced directive.  Patient passed on 02-17-2024 at 1812.   Assessment and Plan: Septic shock w/ lactic acidosis and multiple organ failure due to recurrent UTI with Klebsiella PNA  bacteremia- in immunosuppressed pt ->still pressor dependent  Acute metabolic Encephalopathy-septic encephalopathy and/or critical AKI. W/ chronic foley and prior renal xplant: Scr progressing. No hydro  Fluid and  electrolyte imbalance. Progressive hyponatremia.  -he's over 3 liter pos since admit so don't think this is volume depletion. 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 I have discussed his current situation at length with his wife and son at bedside. They have described to me a history over the last several years of frequent hospitalizations and last time at home. They are deeply concerned about his comfort and dignity, and appropriately identified that even should he recover from this acute illness his likelihood of repeating this again in the near future is essentially a certainty. Because of this and their commitment to his comfort they have agreed to transition to full comfort care. I think they would be interested in a residential hospice situation however I do not see this happening over the weekend, I also suspect with him off blood pressure medications, adding pain medications, and no further titration of his care we will see him decline fairly rapidly    Plan Antibiotics and all lab work discontinued Continue morphine  drip and as needed boluses As needed Ativan  We will continue H2 blockade Full DNR Comfort care only MD received message from RN that patient stopped breathing and had low heart rate. Family remained at bedside. MD put in order RN may pronounce death. Patient was pronounced  dead on July 19th 2025 at 1812. The funeral home and [employer] were notified.       Procedures: echocardiogram  Consultations: Critical care, palliative care  The results of  significant diagnostics from this hospitalization (including imaging, microbiology, ancillary and laboratory) are listed below for reference.   Significant Diagnostic Studies: ECHOCARDIOGRAM COMPLETE Result Date: 01/20/2024    ECHOCARDIOGRAM REPORT   Patient Name:   [name] Date of Exam: 01/20/2024 Medical Rec #:  [id number]       Height:       70.0 in Accession #:    [id number]      Weight:       202.0 lb Date of Birth:  06-Jun-1951        BSA:          2.096 m Patient Age:    73 years        BP:           86/58 mmHg Patient Gender: M               HR:           79 bpm. Exam Location:  Inpatient Procedure: 2D Echo, Cardiac Doppler and Color Doppler (Both Spectral and Color            Flow Doppler were utilized during procedure). Indications:    Shock  History:        Patient has no prior history of Echocardiogram examinations.                 Risk Factors:Diabetes.  Sonographer:    Philomena Daring Referring Phys: [id number] OMAR M ALBUSTAMI IMPRESSIONS  1. Left ventricular ejection fraction, by estimation, is 55 to 60%. The left ventricle has normal function. The left ventricle has no regional wall motion abnormalities. There is mild concentric left ventricular hypertrophy. Left ventricular diastolic parameters were normal.  2. Right ventricular systolic function is moderately reduced. The right ventricular size is normal.  3. The mitral valve is normal in structure. No evidence of mitral valve regurgitation. No evidence of mitral stenosis.  4. The aortic valve is calcified. Aortic valve regurgitation is not visualized. Aortic valve sclerosis is present, with no evidence of aortic valve stenosis.  5. The inferior vena cava is normal in size with greater than 50% respiratory variability, suggesting right atrial pressure of 3 mmHg. FINDINGS  Left Ventricle: Left ventricular ejection fraction, by estimation, is 55 to 60%. The left ventricle has normal function. The left ventricle has no regional wall motion  abnormalities. The left ventricular internal cavity size was normal in size. There is  mild concentric left ventricular hypertrophy. Left ventricular diastolic parameters were normal. Right Ventricle: The right ventricular size is normal. No increase in right ventricular wall thickness. Right ventricular systolic function is moderately reduced. Left Atrium: Left atrial size was normal in size. Right Atrium: Right atrial size was normal in size. Pericardium: There is no evidence of pericardial effusion. Presence of epicardial fat layer. Mitral Valve: The mitral valve is normal in structure. No evidence of mitral valve regurgitation. No evidence of mitral valve stenosis. Tricuspid Valve: The tricuspid valve is normal in structure. Tricuspid valve regurgitation is not demonstrated. No evidence of tricuspid stenosis. Aortic Valve: The aortic valve is calcified. Aortic valve regurgitation is not visualized. Aortic valve sclerosis is present, with no evidence of aortic valve stenosis. Pulmonic Valve: The pulmonic valve was normal in structure. Pulmonic valve regurgitation is not visualized. No evidence of  pulmonic stenosis. Aorta: The aortic root is normal in size and structure. Venous: The inferior vena cava is normal in size with greater than 50% respiratory variability, suggesting right atrial pressure of 3 mmHg. IAS/Shunts: No atrial level shunt detected by color flow Doppler.  LEFT VENTRICLE PLAX 2D LVIDd:         4.60 cm   Diastology LVIDs:         3.00 cm   LV e' medial:    12.30 cm/s LV PW:         1.20 cm   LV E/e' medial:  9.0 LV IVS:        1.30 cm   LV e' lateral:   15.70 cm/s LVOT diam:     1.80 cm   LV E/e' lateral: 7.1 LV SV:         40 LV SV Index:   19 LVOT Area:     2.54 cm  RIGHT VENTRICLE            IVC RV S prime:     6.31 cm/s  IVC diam: 1.70 cm TAPSE (M-mode): 1.6 cm LEFT ATRIUM           Index        RIGHT ATRIUM           Index LA diam:      3.20 cm 1.53 cm/m   RA Area:     14.70 cm LA Vol  (A2C): 43.6 ml 20.80 ml/m  RA Volume:   31.70 ml  15.12 ml/m LA Vol (A4C): 46.8 ml 22.33 ml/m  AORTIC VALVE LVOT Vmax:   81.20 cm/s LVOT Vmean:  52.900 cm/s LVOT VTI:    0.158 m  AORTA Ao Root diam: 3.00 cm Ao Asc diam:  3.30 cm MITRAL VALVE MV Area (PHT): 5.09 cm     SHUNTS MV Decel Time: 149 msec     Systemic VTI:  0.16 m MV E velocity: 111.00 cm/s  Systemic Diam: 1.80 cm Kardie Tobb DO Electronically signed by Dub Huntsman DO Signature Date/Time: 01/20/2024/4:46:29 PM    Final    CT CHEST ABDOMEN PELVIS W CONTRAST Result Date: 01/20/2024 CLINICAL DATA:  Worsening hypotension and altered mental status, history of renal and hepatic transplant EXAM: CT CHEST, ABDOMEN, AND PELVIS WITH CONTRAST TECHNIQUE: Multidetector CT imaging of the chest, abdomen and pelvis was performed following the standard protocol during bolus administration of intravenous contrast. RADIATION DOSE REDUCTION: This exam was performed according to the departmental dose-optimization program which includes automated exposure control, adjustment of the mA and/or kV according to patient size and/or use of iterative reconstruction technique. CONTRAST:  75mL OMNIPAQUE  IOHEXOL  350 MG/ML SOLN COMPARISON:  CT from 6 hours previous FINDINGS: CT CHEST FINDINGS Cardiovascular: Thoracic aorta shows no aneurysmal dilatation or dissection. No cardiac enlargement is seen. Pulmonary artery as visualized is within normal limits although not timed for embolus evaluation. Mediastinum/Nodes: Thoracic inlet is within normal limits. No hilar or mediastinal adenopathy is noted. Esophagus is within normal limits. Lungs/Pleura: Lungs are well aerated bilaterally although increasing basilar infiltrate is noted right greater than left. Mild motion artifact is noted. Previously seen parenchymal nodule in the right upper lobe is less well visualized due to the motion artifact. Musculoskeletal: Degenerative changes of the thoracic spine are noted. No acute rib  abnormality is seen. CT ABDOMEN PELVIS FINDINGS Hepatobiliary: Gallbladder has been surgically removed. Liver shows no focal mass lesion. Postsurgical changes are noted consistent with the known history of hepatic  transplant. No biliary ductal dilatation is seen. Pancreas: Changes of chronic pancreatitis are again seen. Mild chronic dilatation of the pancreatic duct is noted. Spleen: Normal in size without focal abnormality. Adrenals/Urinary Tract: Adrenal glands are within normal limits. Kidneys demonstrate renal cystic change on the right stable from the prior study. Nonobstructing renal stone is again seen on the left. Ureters are within normal limits. The bladder is decompressed by Foley catheter. Right iliac fossa transplant is noted within normal enhancement pattern. Again no calculi or obstructive changes are seen. Stomach/Bowel: No obstructive or inflammatory changes of the colon are seen. The appendix is well visualized and within normal limits. Small bowel and stomach are unremarkable. Vascular/Lymphatic: Aortic atherosclerosis. No enlarged abdominal or pelvic lymph nodes. Reproductive: Prostate is unremarkable. Other: Fat containing ventral hernia is again noted. No abdominopelvic ascites. Musculoskeletal: No acute or significant osseous findings. IMPRESSION: CT of the chest: New increasing basilar infiltrate right greater than left. No sizable effusion is noted. Previously seen nodule is not well appreciated due to motion artifact. CT of the abdomen and pelvis: Chronic changes consistent with hepatic and renal transplant. No acute abnormality noted. Electronically Signed   By: Oneil Devonshire M.D.   On: 01/20/2024 03:45   CT CHEST ABDOMEN PELVIS WO CONTRAST Result Date: 01/19/2024 CLINICAL DATA:  Possible sepsis EXAM: CT CHEST, ABDOMEN AND PELVIS WITHOUT CONTRAST TECHNIQUE: Multidetector CT imaging of the chest, abdomen and pelvis was performed following the standard protocol without IV contrast.  RADIATION DOSE REDUCTION: This exam was performed according to the departmental dose-optimization program which includes automated exposure control, adjustment of the mA and/or kV according to patient size and/or use of iterative reconstruction technique. COMPARISON:  07/07/2022 FINDINGS: CT CHEST FINDINGS Cardiovascular: Somewhat limited due to lack of IV contrast. Atherosclerotic calcifications are noted without aneurysmal dilatation. Pulmonary artery is within normal limits. Heart is not significantly enlarged. Scattered coronary calcifications are noted. Mediastinum/Nodes: Thoracic inlet is within normal limits. No hilar or mediastinal adenopathy is noted. The esophagus as visualized is within normal limits. Lungs/Pleura: Lungs are well aerated bilaterally. Small less than 5 mm nodule is noted within the right upper lobe along the major fissure best seen on image number 67 series 5. No other sizable nodules are seen. No effusion is noted. Musculoskeletal: No acute rib abnormality is noted. Degenerative changes of the thoracic spine are seen. CT ABDOMEN PELVIS FINDINGS Hepatobiliary: No focal liver abnormality is seen. Status post cholecystectomy. No biliary dilatation. Pancreas: Scattered calcifications are noted throughout the pancreas consistent with chronic pancreatitis. Spleen: Normal in size without focal abnormality. Adrenals/Urinary Tract: Adrenal glands are within normal limits. Kidneys are well visualized. No obstructive changes are seen. Small nonobstructing stone is noted in the left kidney. Simple cyst is noted in the lower pole of the right kidney. No follow-up is recommended. The bladder is decompressed by Foley catheter. Right iliac fossa renal transplant is noted similar to that seen on prior CT examination. Stomach/Bowel: No obstructive or inflammatory changes of the colon are seen. The appendix is within normal limits. Small bowel and stomach are unremarkable. Vascular/Lymphatic: Aortic  atherosclerosis. No enlarged abdominal or pelvic lymph nodes. Reproductive: Prostate is unremarkable. Other: No free fluid is noted. Fat containing ventral hernia is seen stable from the prior exam. Musculoskeletal: No acute bony abnormality is noted. IMPRESSION: CT of the chest:Right solid pulmonary nodule within the upper lobe measuring 4 mm. Per Fleischner Society Guidelines, if patient is low risk for malignancy, no routine follow-up imaging is recommended. If  patient is high risk for malignancy, a non-contrast Chest CT at 12 months is optional. If performed and the nodule is stable at 12 months, no further follow-up is recommended. These guidelines do not apply to immunocompromised patients and patients with cancer. Follow up in patients with significant comorbidities as clinically warranted. For lung cancer screening, adhere to Lung-RADS guidelines. Reference: Radiology. 2017; 284(1):228-43. No other focal abnormality is noted. CT of the abdomen and pelvis: Small nonobstructing left renal stone. Right renal transplant. Chronic changes as described above. Electronically Signed   By: Oneil Devonshire M.D.   On: 01/19/2024 21:26   DG Chest Port 1 View Result Date: 01/19/2024 CLINICAL DATA:  Questionable sepsis EXAM: PORTABLE CHEST 1 VIEW COMPARISON:  Chest x-ray 07/07/2022 FINDINGS: The heart size and mediastinal contours are within normal limits. Both lungs are clear. The visualized skeletal structures are unremarkable. There are surgical clips in the right abdomen. IMPRESSION: No active disease. Electronically Signed   By: Greig Pique M.D.   On: 01/19/2024 21:18    Microbiology: Recent Results (from the past 240 hours)  Blood Culture (routine x 2)     Status: Abnormal   Collection Time: 01/19/24  8:46 PM   Specimen: BLOOD  Result Value Ref Range Status   Specimen Description BLOOD SITE NOT SPECIFIED  Final   Special Requests   Final    BOTTLES DRAWN AEROBIC AND ANAEROBIC Blood Culture adequate  volume   Culture  Setup Time   Final    GRAM NEGATIVE RODS IN BOTH AEROBIC AND ANAEROBIC BOTTLES CRITICAL RESULT CALLED TO, READ BACK BY AND VERIFIED WITH: PHARMD E SINCLAIRE [id number] AT 910 BY CM Performed at [facility], [address], [place], [place] [address]    Culture KLEBSIELLA PNEUMONIAE (A)  Final   Report Status 01/22/2024 FINAL  Final   Organism ID, Bacteria KLEBSIELLA PNEUMONIAE  Final   Organism ID, Bacteria KLEBSIELLA PNEUMONIAE  Final      Susceptibility   Klebsiella pneumoniae - KIRBY BAUER*    CEFAZOLIN  SENSITIVE Sensitive    Klebsiella pneumoniae - MIC*    AMPICILLIN  >=32 RESISTANT Resistant     CEFEPIME  <=0.12 SENSITIVE Sensitive     CEFTAZIDIME <=1 SENSITIVE Sensitive     CEFTRIAXONE  <=0.25 SENSITIVE Sensitive     CIPROFLOXACIN  <=0.25 SENSITIVE Sensitive     GENTAMICIN <=1 SENSITIVE Sensitive     IMIPENEM <=0.25 SENSITIVE Sensitive     TRIMETH/SULFA <=20 SENSITIVE Sensitive     AMPICILLIN /SULBACTAM 8 SENSITIVE Sensitive     PIP/TAZO <=4 SENSITIVE Sensitive ug/mL    * KLEBSIELLA PNEUMONIAE    KLEBSIELLA PNEUMONIAE  Blood Culture ID Panel (Reflexed)     Status: Abnormal   Collection Time: 01/19/24  8:46 PM  Result Value Ref Range Status   Enterococcus faecalis NOT DETECTED NOT DETECTED Final   Enterococcus Faecium NOT DETECTED NOT DETECTED Final   Listeria monocytogenes NOT DETECTED NOT DETECTED Final   Staphylococcus species NOT DETECTED NOT DETECTED Final   Staphylococcus aureus (BCID) NOT DETECTED NOT DETECTED Final   Staphylococcus epidermidis NOT DETECTED NOT DETECTED Final   Staphylococcus lugdunensis NOT DETECTED NOT DETECTED Final   Streptococcus species NOT DETECTED NOT DETECTED Final   Streptococcus agalactiae NOT DETECTED NOT DETECTED Final   Streptococcus pneumoniae NOT DETECTED NOT DETECTED Final   Streptococcus pyogenes NOT DETECTED NOT DETECTED Final   A.calcoaceticus-baumannii NOT DETECTED NOT DETECTED Final   Bacteroides fragilis NOT  DETECTED NOT DETECTED Final   Enterobacterales DETECTED (A) NOT  DETECTED Final    Comment: Enterobacterales represent a large order of gram negative bacteria, not a single organism. CRITICAL RESULT CALLED TO, READ BACK BY AND VERIFIED WITH: PHARMD E SINCLAIRE [id number] AT 910 BY CM    Enterobacter cloacae complex NOT DETECTED NOT DETECTED Final   Escherichia coli NOT DETECTED NOT DETECTED Final   Klebsiella aerogenes NOT DETECTED NOT DETECTED Final   Klebsiella oxytoca NOT DETECTED NOT DETECTED Final   Klebsiella pneumoniae DETECTED (A) NOT DETECTED Final    Comment: CRITICAL RESULT CALLED TO, READ BACK BY AND VERIFIED WITH: PHARMD E SINCLAIRE [id number] AT 910 BY CM    Proteus species NOT DETECTED NOT DETECTED Final   Salmonella species NOT DETECTED NOT DETECTED Final   Serratia marcescens NOT DETECTED NOT DETECTED Final   Haemophilus influenzae NOT DETECTED NOT DETECTED Final   Neisseria meningitidis NOT DETECTED NOT DETECTED Final   Pseudomonas aeruginosa NOT DETECTED NOT DETECTED Final   Stenotrophomonas maltophilia NOT DETECTED NOT DETECTED Final   Candida albicans NOT DETECTED NOT DETECTED Final   Candida auris NOT DETECTED NOT DETECTED Final   Candida glabrata NOT DETECTED NOT DETECTED Final   Candida krusei NOT DETECTED NOT DETECTED Final   Candida parapsilosis NOT DETECTED NOT DETECTED Final   Candida tropicalis NOT DETECTED NOT DETECTED Final   Cryptococcus neoformans/gattii NOT DETECTED NOT DETECTED Final   CTX-M ESBL NOT DETECTED NOT DETECTED Final   Carbapenem resistance IMP NOT DETECTED NOT DETECTED Final   Carbapenem resistance KPC NOT DETECTED NOT DETECTED Final   Carbapenem resistance NDM NOT DETECTED NOT DETECTED Final   Carbapenem resist OXA 48 LIKE NOT DETECTED NOT DETECTED Final   Carbapenem resistance VIM NOT DETECTED NOT DETECTED Final    Comment: Performed at [facility], [address] [place], [place] [address]  Blood Culture (routine x 2)     Status:  Abnormal   Collection Time: 01/19/24  8:55 PM   Specimen: BLOOD  Result Value Ref Range Status   Specimen Description BLOOD SITE NOT SPECIFIED  Final   Special Requests   Final    BOTTLES DRAWN AEROBIC AND ANAEROBIC Blood Culture adequate volume   Culture  Setup Time   Final    ANAEROBIC BOTTLE ONLY GRAM NEGATIVE RODS CRITICAL RESULT CALLED TO, READ BACK BY AND VERIFIED WITH: PHARMD E SINVLAIRE [id number] AT 911 AM BY CM IN BOTH AEROBIC AND ANAEROBIC BOTTLES PREVIOUSLY REPORTED AS: GRAM POSITIVE COCCI IN CLUSTERS CORRECTED RESULTS CALLED TO: PHARMD E SINCLAIRE [id number] AT 911 BY CM    Culture (A)  Final    KLEBSIELLA PNEUMONIAE SUSCEPTIBILITIES PERFORMED ON PREVIOUS CULTURE WITHIN THE LAST 5 DAYS. Performed at [facility], [address] [place], [place] [address]    Report Status 01/22/2024 FINAL  Final  Blood Culture ID Panel (Reflexed)     Status: Abnormal   Collection Time: 01/19/24  8:55 PM  Result Value Ref Range Status   Enterococcus faecalis NOT DETECTED NOT DETECTED Final   Enterococcus Faecium NOT DETECTED NOT DETECTED Final   Listeria monocytogenes NOT DETECTED NOT DETECTED Final   Staphylococcus species NOT DETECTED NOT DETECTED Final   Staphylococcus aureus (BCID) NOT DETECTED NOT DETECTED Final   Staphylococcus epidermidis NOT DETECTED NOT DETECTED Final   Staphylococcus lugdunensis NOT DETECTED NOT DETECTED Final   Streptococcus species NOT DETECTED NOT DETECTED Final   Streptococcus agalactiae NOT DETECTED NOT DETECTED Final   Streptococcus pneumoniae NOT DETECTED NOT DETECTED Final   Streptococcus pyogenes NOT  DETECTED NOT DETECTED Final   A.calcoaceticus-baumannii NOT DETECTED NOT DETECTED Final   Bacteroides fragilis NOT DETECTED NOT DETECTED Final   Enterobacterales DETECTED (A) NOT DETECTED Final    Comment: Enterobacterales represent a large order of gram negative bacteria, not a single organism. CRITICAL RESULT CALLED TO, READ BACK BY AND VERIFIED  WITH: PHARMD E SINCLAIRE [id number] AT 910 BY CM    Enterobacter cloacae complex NOT DETECTED NOT DETECTED Final   Escherichia coli NOT DETECTED NOT DETECTED Final   Klebsiella aerogenes NOT DETECTED NOT DETECTED Final   Klebsiella oxytoca NOT DETECTED NOT DETECTED Final   Klebsiella pneumoniae DETECTED (A) NOT DETECTED Final    Comment: CRITICAL RESULT CALLED TO, READ BACK BY AND VERIFIED WITH: PHARMD E SINCLAIRE [id number] AT 910 AM BY CM    Proteus species NOT DETECTED NOT DETECTED Final   Salmonella species NOT DETECTED NOT DETECTED Final   Serratia marcescens NOT DETECTED NOT DETECTED Final   Haemophilus influenzae NOT DETECTED NOT DETECTED Final   Neisseria meningitidis NOT DETECTED NOT DETECTED Final   Pseudomonas aeruginosa NOT DETECTED NOT DETECTED Final   Stenotrophomonas maltophilia NOT DETECTED NOT DETECTED Final   Candida albicans NOT DETECTED NOT DETECTED Final   Candida auris NOT DETECTED NOT DETECTED Final   Candida glabrata NOT DETECTED NOT DETECTED Final   Candida krusei NOT DETECTED NOT DETECTED Final   Candida parapsilosis NOT DETECTED NOT DETECTED Final   Candida tropicalis NOT DETECTED NOT DETECTED Final   Cryptococcus neoformans/gattii NOT DETECTED NOT DETECTED Final   CTX-M ESBL NOT DETECTED NOT DETECTED Final   Carbapenem resistance IMP NOT DETECTED NOT DETECTED Final   Carbapenem resistance KPC NOT DETECTED NOT DETECTED Final   Carbapenem resistance NDM NOT DETECTED NOT DETECTED Final   Carbapenem resist OXA 48 LIKE NOT DETECTED NOT DETECTED Final   Carbapenem resistance VIM NOT DETECTED NOT DETECTED Final    Comment: Performed at [facility], [address], [place], [place] [address]  Resp panel by RT-PCR (RSV, Flu A&B, Covid) Anterior Nasal Swab     Status: None   Collection Time: 01/19/24  9:19 PM   Specimen: Anterior Nasal Swab  Result Value Ref Range Status   SARS Coronavirus 2 by RT PCR NEGATIVE NEGATIVE Final   Influenza A by PCR NEGATIVE NEGATIVE  Final   Influenza B by PCR NEGATIVE NEGATIVE Final    Comment: (NOTE) The Xpert Xpress SARS-CoV-2/FLU/RSV plus assay is intended as an aid in the diagnosis of influenza from Nasopharyngeal swab specimens and should not be used as a sole basis for treatment. Nasal washings and aspirates are unacceptable for Xpert Xpress SARS-CoV-2/FLU/RSV testing.  Fact Sheet for Patients: BloggerCourse.com  Fact Sheet for Healthcare Providers: SeriousBroker.it  This test is not yet approved or cleared by the [place]  FDA and has been authorized for detection and/or diagnosis of SARS-CoV-2 by FDA under an Emergency Use Authorization (EUA). This EUA will remain in effect (meaning this test can be used) for the duration of the COVID-19 declaration under Section 564(b)(1) of the Act, 21 U.S.C. section 360bbb-3(b)(1), unless the authorization is terminated or revoked.     Resp Syncytial Virus by PCR NEGATIVE NEGATIVE Final    Comment: (NOTE) Fact Sheet for Patients: BloggerCourse.com  Fact Sheet for Healthcare Providers: SeriousBroker.it  This test is not yet approved or cleared by the [place]  FDA and has been authorized for detection and/or diagnosis of SARS-CoV-2 by FDA under an Emergency Use Authorization (EUA). This EUA will  remain in effect (meaning this test can be used) for the duration of the COVID-19 declaration under Section 564(b)(1) of the Act, 21 U.S.C. section 360bbb-3(b)(1), unless the authorization is terminated or revoked.  Performed at [facility], [address] [place], [place] [address]   MRSA Next Gen by PCR, Nasal     Status: Abnormal   Collection Time: 01/20/24  6:16 AM   Specimen: Nasal Mucosa; Nasal Swab  Result Value Ref Range Status   MRSA by PCR Next Gen DETECTED (A) NOT DETECTED Final    Comment: RESULT CALLED TO, READ BACK BY AND VERIFIED  WITH: RN IRIS BONIOOA 01/20/24 1109AM JC (NOTE) The GeneXpert MRSA Assay (FDA approved for NASAL specimens only), is one component of a comprehensive MRSA colonization surveillance program. It is not intended to diagnose MRSA infection nor to guide or monitor treatment for MRSA infections. Test performance is not FDA approved in patients less than 60 years old. Performed at [facility], [address], [place], [place] [address]     Time spent: 15 minutes  Signed: Claretta CHRISTELLA Alderman, MD 01/25/24

## 2024-02-05 DEATH — deceased
